# Patient Record
Sex: Female | Born: 1960
Health system: Southern US, Community
[De-identification: ages and names within clinical notes are randomized; demographics above are authoritative.]

## PROBLEM LIST (undated history)

## (undated) DIAGNOSIS — G5602 Carpal tunnel syndrome, left upper limb: Secondary | ICD-10-CM

## (undated) DIAGNOSIS — E669 Obesity, unspecified: Secondary | ICD-10-CM

## (undated) DIAGNOSIS — C221 Intrahepatic bile duct carcinoma: Secondary | ICD-10-CM

## (undated) DIAGNOSIS — I10 Essential (primary) hypertension: Secondary | ICD-10-CM

## (undated) DIAGNOSIS — E119 Type 2 diabetes mellitus without complications: Secondary | ICD-10-CM

## (undated) DIAGNOSIS — M65939 Unspecified synovitis and tenosynovitis, unspecified forearm: Secondary | ICD-10-CM

## (undated) DIAGNOSIS — B353 Tinea pedis: Secondary | ICD-10-CM

## (undated) DIAGNOSIS — L84 Corns and callosities: Secondary | ICD-10-CM

## (undated) DIAGNOSIS — M5412 Radiculopathy, cervical region: Secondary | ICD-10-CM

## (undated) DIAGNOSIS — F32A Depression, unspecified: Secondary | ICD-10-CM

## (undated) DIAGNOSIS — I471 Supraventricular tachycardia, unspecified: Secondary | ICD-10-CM

## (undated) DIAGNOSIS — M109 Gout, unspecified: Secondary | ICD-10-CM

## (undated) DIAGNOSIS — M7542 Impingement syndrome of left shoulder: Secondary | ICD-10-CM

## (undated) DIAGNOSIS — E559 Vitamin D deficiency, unspecified: Secondary | ICD-10-CM

## (undated) DIAGNOSIS — T7840XA Allergy, unspecified, initial encounter: Secondary | ICD-10-CM

## (undated) DIAGNOSIS — R809 Proteinuria, unspecified: Secondary | ICD-10-CM

## (undated) DIAGNOSIS — M1990 Unspecified osteoarthritis, unspecified site: Secondary | ICD-10-CM

## (undated) DIAGNOSIS — E785 Hyperlipidemia, unspecified: Secondary | ICD-10-CM

## (undated) DIAGNOSIS — F329 Major depressive disorder, single episode, unspecified: Secondary | ICD-10-CM

## (undated) DIAGNOSIS — M659 Synovitis and tenosynovitis, unspecified: Secondary | ICD-10-CM

## (undated) DIAGNOSIS — M5417 Radiculopathy, lumbosacral region: Secondary | ICD-10-CM

## (undated) DIAGNOSIS — J45909 Unspecified asthma, uncomplicated: Secondary | ICD-10-CM

## (undated) HISTORY — DX: Supraventricular tachycardia, unspecified: I47.10

## (undated) HISTORY — DX: Unspecified synovitis and tenosynovitis, unspecified forearm: M65.939

## (undated) HISTORY — DX: Carpal tunnel syndrome, left upper limb: G56.02

## (undated) HISTORY — DX: Type 2 diabetes mellitus without complications: E11.9

## (undated) HISTORY — DX: Corns and callosities: L84

## (undated) HISTORY — DX: Synovitis and tenosynovitis, unspecified: M65.9

## (undated) HISTORY — DX: Vitamin D deficiency, unspecified: E55.9

## (undated) HISTORY — DX: Hyperlipidemia, unspecified: E78.5

## (undated) HISTORY — DX: Unspecified osteoarthritis, unspecified site: M19.90

## (undated) HISTORY — DX: Proteinuria, unspecified: R80.9

## (undated) HISTORY — DX: Tinea pedis: B35.3

## (undated) HISTORY — DX: Supraventricular tachycardia: I47.1

## (undated) HISTORY — PX: ABDOMINAL HYSTERECTOMY: SHX81

## (undated) HISTORY — DX: Radiculopathy, cervical region: M54.12

## (undated) HISTORY — DX: Unspecified asthma, uncomplicated: J45.909

## (undated) HISTORY — DX: Gout, unspecified: M10.9

## (undated) HISTORY — DX: Depression, unspecified: F32.A

## (undated) HISTORY — DX: Essential (primary) hypertension: I10

## (undated) HISTORY — DX: Impingement syndrome of left shoulder: M75.42

## (undated) HISTORY — DX: Allergy, unspecified, initial encounter: T78.40XA

## (undated) HISTORY — DX: Intrahepatic bile duct carcinoma: C22.1

## (undated) HISTORY — DX: Obesity, unspecified: E66.9

## (undated) HISTORY — DX: Radiculopathy, lumbosacral region: M54.17

## (undated) MED FILL — Dexamethasone Sodium Phosphate Inj 100 MG/10ML: INTRAMUSCULAR | Qty: 1 | Status: AC

---
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
## 2006-01-15 ENCOUNTER — Observation Stay (HOSPITAL_COMMUNITY): Admission: RE | Admit: 2006-01-15 | Discharge: 2006-01-16 | Payer: Self-pay | Admitting: Gynecology

## 2006-02-16 ENCOUNTER — Ambulatory Visit: Payer: Self-pay

## 2006-03-03 ENCOUNTER — Ambulatory Visit: Payer: Self-pay | Admitting: Family Medicine

## 2006-07-20 ENCOUNTER — Ambulatory Visit: Payer: Self-pay | Admitting: Pain Medicine

## 2006-07-26 ENCOUNTER — Ambulatory Visit: Payer: Self-pay | Admitting: Pain Medicine

## 2006-09-28 ENCOUNTER — Ambulatory Visit: Payer: Self-pay | Admitting: Pain Medicine

## 2006-10-25 ENCOUNTER — Ambulatory Visit: Payer: Self-pay | Admitting: Pain Medicine

## 2006-11-25 ENCOUNTER — Ambulatory Visit: Payer: Self-pay | Admitting: Pain Medicine

## 2007-08-11 DIAGNOSIS — M5417 Radiculopathy, lumbosacral region: Secondary | ICD-10-CM | POA: Insufficient documentation

## 2007-09-07 ENCOUNTER — Ambulatory Visit: Payer: Self-pay | Admitting: Pain Medicine

## 2007-09-15 ENCOUNTER — Ambulatory Visit: Payer: Self-pay | Admitting: Pain Medicine

## 2008-12-28 DIAGNOSIS — J4520 Mild intermittent asthma, uncomplicated: Secondary | ICD-10-CM | POA: Insufficient documentation

## 2008-12-28 DIAGNOSIS — J309 Allergic rhinitis, unspecified: Secondary | ICD-10-CM | POA: Insufficient documentation

## 2009-01-08 DIAGNOSIS — E782 Mixed hyperlipidemia: Secondary | ICD-10-CM | POA: Insufficient documentation

## 2009-01-08 DIAGNOSIS — E559 Vitamin D deficiency, unspecified: Secondary | ICD-10-CM | POA: Insufficient documentation

## 2009-11-05 DIAGNOSIS — IMO0002 Reserved for concepts with insufficient information to code with codable children: Secondary | ICD-10-CM | POA: Insufficient documentation

## 2009-11-05 DIAGNOSIS — I10 Essential (primary) hypertension: Secondary | ICD-10-CM | POA: Insufficient documentation

## 2013-06-02 LAB — LIPID PANEL
Cholesterol: 97 mg/dL (ref 0–200)
HDL: 29 mg/dL — AB (ref 35–70)
LDL Cholesterol: 46 mg/dL
Triglycerides: 112 mg/dL (ref 40–160)

## 2014-04-23 LAB — HM MAMMOGRAPHY

## 2015-03-06 LAB — HEMOGLOBIN A1C: Hgb A1c MFr Bld: 8.3 % — AB (ref 4.0–6.0)

## 2015-04-04 ENCOUNTER — Emergency Department: Admit: 2015-04-04 | Disposition: A | Discharge status: Home / Self Care | Payer: Self-pay | Admitting: Student

## 2015-07-01 ENCOUNTER — Encounter (INDEPENDENT_AMBULATORY_CARE_PROVIDER_SITE_OTHER): Payer: BC Managed Care – PPO | Admitting: Family Medicine

## 2015-07-01 DIAGNOSIS — G5600 Carpal tunnel syndrome, unspecified upper limb: Secondary | ICD-10-CM | POA: Insufficient documentation

## 2015-07-01 DIAGNOSIS — R809 Proteinuria, unspecified: Secondary | ICD-10-CM | POA: Insufficient documentation

## 2015-07-01 DIAGNOSIS — M5412 Radiculopathy, cervical region: Secondary | ICD-10-CM | POA: Insufficient documentation

## 2015-07-01 DIAGNOSIS — I471 Supraventricular tachycardia, unspecified: Secondary | ICD-10-CM

## 2015-07-01 DIAGNOSIS — M7540 Impingement syndrome of unspecified shoulder: Secondary | ICD-10-CM | POA: Insufficient documentation

## 2015-07-01 DIAGNOSIS — L84 Corns and callosities: Secondary | ICD-10-CM | POA: Insufficient documentation

## 2015-07-01 DIAGNOSIS — Z111 Encounter for screening for respiratory tuberculosis: Secondary | ICD-10-CM

## 2015-07-01 DIAGNOSIS — M659 Synovitis and tenosynovitis, unspecified: Secondary | ICD-10-CM | POA: Insufficient documentation

## 2015-07-01 DIAGNOSIS — F17200 Nicotine dependence, unspecified, uncomplicated: Secondary | ICD-10-CM | POA: Insufficient documentation

## 2015-07-01 HISTORY — DX: Supraventricular tachycardia, unspecified: I47.10

## 2015-07-01 HISTORY — DX: Supraventricular tachycardia: I47.1

## 2015-07-01 NOTE — Progress Notes (Signed)
Opened in error. Office visit changed to nursing visit.

## 2015-07-03 ENCOUNTER — Other Ambulatory Visit: Payer: Self-pay

## 2015-07-03 LAB — TB SKIN TEST
Induration: 0 mm
TB Skin Test: NEGATIVE

## 2015-07-10 ENCOUNTER — Telehealth: Payer: Self-pay | Admitting: Family Medicine

## 2015-07-10 NOTE — Telephone Encounter (Signed)
ERRENOUS °

## 2015-07-26 ENCOUNTER — Telehealth: Payer: Self-pay | Admitting: Family Medicine

## 2015-07-26 NOTE — Telephone Encounter (Signed)
Due to patient and child having upper respiratory issues (asthma), patient is requesting a letter for social services to help her get a air conditioner. States that she lives in a trailer and hers is broken and social services are willing to help her. Requesting to pick this up this afternoon.

## 2015-07-29 NOTE — Telephone Encounter (Signed)
She will need to be seen

## 2015-07-30 NOTE — Telephone Encounter (Signed)
Dr.sowles states she will need to be seen in 1st available slot

## 2015-07-30 NOTE — Telephone Encounter (Signed)
Patient informed. stated that she did not have the copay for a piece of paper and hung up.

## 2015-09-16 ENCOUNTER — Encounter: Payer: Self-pay | Admitting: Emergency Medicine

## 2015-09-16 ENCOUNTER — Emergency Department
Admission: EM | Admit: 2015-09-16 | Discharge: 2015-09-16 | Disposition: A | Discharge status: Home / Self Care | Payer: Self-pay | Attending: Emergency Medicine | Admitting: Emergency Medicine

## 2015-09-16 DIAGNOSIS — G8929 Other chronic pain: Secondary | ICD-10-CM | POA: Insufficient documentation

## 2015-09-16 DIAGNOSIS — E119 Type 2 diabetes mellitus without complications: Secondary | ICD-10-CM | POA: Insufficient documentation

## 2015-09-16 DIAGNOSIS — Z79899 Other long term (current) drug therapy: Secondary | ICD-10-CM | POA: Insufficient documentation

## 2015-09-16 DIAGNOSIS — M545 Low back pain: Secondary | ICD-10-CM | POA: Insufficient documentation

## 2015-09-16 DIAGNOSIS — M549 Dorsalgia, unspecified: Secondary | ICD-10-CM

## 2015-09-16 DIAGNOSIS — I10 Essential (primary) hypertension: Secondary | ICD-10-CM | POA: Insufficient documentation

## 2015-09-16 DIAGNOSIS — Z7951 Long term (current) use of inhaled steroids: Secondary | ICD-10-CM | POA: Insufficient documentation

## 2015-09-16 DIAGNOSIS — E669 Obesity, unspecified: Secondary | ICD-10-CM | POA: Insufficient documentation

## 2015-09-16 DIAGNOSIS — Z7982 Long term (current) use of aspirin: Secondary | ICD-10-CM | POA: Insufficient documentation

## 2015-09-16 DIAGNOSIS — E785 Hyperlipidemia, unspecified: Secondary | ICD-10-CM | POA: Insufficient documentation

## 2015-09-16 MED ORDER — TRAMADOL HCL 50 MG PO TABS: 50.0000 mg | ORAL_TABLET | Freq: Four times a day (QID) | ORAL | Status: DC | PRN

## 2015-09-16 MED ORDER — ORPHENADRINE CITRATE 30 MG/ML IJ SOLN
60.0000 mg | Freq: Two times a day (BID) | INTRAMUSCULAR | Status: DC
  Administered 2015-09-16: 60 mg via INTRAMUSCULAR
  Filled 2015-09-16: qty 2

## 2015-09-16 MED ORDER — HYDROMORPHONE HCL 1 MG/ML IJ SOLN
1.0000 mg | Freq: Once | INTRAMUSCULAR | Status: AC
  Administered 2015-09-16: 1 mg via INTRAMUSCULAR
  Filled 2015-09-16: qty 1

## 2015-09-16 MED ORDER — MELOXICAM 15 MG PO TABS: 15.0000 mg | ORAL_TABLET | Freq: Every day | ORAL | Status: DC

## 2015-09-16 MED ORDER — KETOROLAC TROMETHAMINE 60 MG/2ML IM SOLN
60.0000 mg | Freq: Once | INTRAMUSCULAR | Status: AC
  Administered 2015-09-16: 60 mg via INTRAMUSCULAR
  Filled 2015-09-16: qty 2

## 2015-09-16 NOTE — ED Notes (Signed)
Patient presents to the ED with severe back pain x 1.5 weeks with a history of chronic back pain.  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

## 2015-09-16 NOTE — ED Provider Notes (Signed)
[facility] Emergency Department Provider Note  ____________________________________________  Time seen: Approximately 10:52 AM  I have reviewed the triage vital signs and the nursing notes.   HISTORY  Chief Complaint Back Pain    HPI Janice Short is a 54 y.o. female the patient complaining of week and a half of severe low back pain. Patient has history of chronic low back pain. Patient has a remarkable family history ARTHRITIS. Patient denies any radicular component to this pain. She denies any bladder or bowel dysfunction. Patient rates her pain as a 10 over 10. No palliative measures taken for this complaint.  Past Medical History  Diagnosis Date  . Tenosynovitis of wrist   . Corns and callosity   . SVT (supraventricular tachycardia) ([place])   . Obesity   . Chronic osteoarthritis   . Cervical radiculitis   . Microalbuminuria   . Impingement syndrome of left shoulder   . Carpal tunnel syndrome on left   . Lumbosacral neuritis   . Asthma   . Allergy   . Gout   . Hyperlipidemia   . Vitamin D deficiency   . Dermatophytosis of foot   . Hypertension   . Diabetes mellitus without complication [facility])     Patient Active Problem List   Diagnosis Date Noted  . Carpal tunnel syndrome 07/01/2015  . Cervical radiculitis 07/01/2015  . Arthritis sicca 07/01/2015  . Corn or callus 07/01/2015  . Impingement syndrome of shoulder 07/01/2015  . Microalbuminuria 07/01/2015  . Adiposity 07/01/2015  . Supraventricular tachycardia ([place]) 07/01/2015  . Compulsive tobacco user syndrome 07/01/2015  . Tenosynovitis of wrist 07/01/2015  . Benign hypertension 11/05/2009  . Diabetes mellitus type 2, uncontrolled ([place]) 11/05/2009  . Athlete's foot 08/05/2009  . Combined fat and carbohydrate induced hyperlipemia 01/08/2009  . Avitaminosis D 01/08/2009  . Asthma, mild intermittent 12/28/2008  . Allergic rhinitis 12/28/2008  . Lumbosacral neuritis 08/11/2007    Past  Surgical History  Procedure Laterality Date  . Abdominal hysterectomy      Current Outpatient Rx  Name  Route  Sig  Dispense  Refill  . acetaminophen (TYLENOL) 500 MG tablet   Oral   Take 1 tablet by mouth at bedtime as needed.         Marland Kitchen aspirin 81 MG tablet   Oral   Take 1 tablet by mouth daily.         Marland Kitchen atenolol (TENORMIN) 25 MG tablet   Oral   Take 1 tablet by mouth every evening.         Marland Kitchen atorvastatin (LIPITOR) 40 MG tablet   Oral   Take 1 tablet by mouth at bedtime.         . beclomethasone (QVAR) 40 MCG/ACT inhaler   Inhalation   Inhale 2 puffs into the lungs 2 (two) times daily.         . diclofenac sodium (VOLTAREN) 1 % GEL   Transdermal   Place 4 g onto the skin every 6 (six) hours as needed.         . fluticasone (FLONASE) 50 MCG/ACT nasal spray   Nasal   Place 2 sprays into the nose at bedtime.         . gabapentin (NEURONTIN) 300 MG capsule   Oral   Take 1 capsule by mouth every evening.         Marland Kitchen losartan-hydrochlorothiazide (HYZAAR) 50-12.5 MG per tablet   Oral   Take 1 tablet by mouth daily.         Marland Kitchen  meloxicam (MOBIC) 15 MG tablet   Oral   Take 1 tablet (15 mg total) by mouth daily.   30 tablet   2   . naproxen (NAPROSYN) 500 MG tablet   Oral   Take 1 tablet by mouth 2 (two) times daily as needed.         . Saxagliptin-Metformin (KOMBIGLYZE XR) 2.04-999 MG TB24   Oral   Take 2 tablets by mouth daily.         . traMADol (ULTRAM) 50 MG tablet   Oral   Take 1 tablet (50 mg total) by mouth every 6 (six) hours as needed for moderate pain.   12 tablet   0     Allergies Review of patient's allergies indicates no known allergies.  Family History  Problem Relation Age of Onset  . Heart attack Mother   . CAD Mother   . Kidney disease Father     Social History Social History  Substance Use Topics  . Smoking status: None  . Smokeless tobacco: None  . Alcohol Use: None    Review of Systems Constitutional:  No fever/chills Eyes: No visual changes. ENT: No sore throat. Cardiovascular: Denies chest pain. Respiratory: Denies shortness of breath. Gastrointestinal: No abdominal pain.  No nausea, no vomiting.  No diarrhea.  No constipation. Genitourinary: Negative for dysuria. Musculoskeletal: Positive for back pain. Skin: Negative for rash. Neurological: Negative for headaches, focal weakness or numbness. Endocrine:Obesity, hyperlipidemia, hypertension, diabetes. 10-point ROS otherwise negative.  ____________________________________________   PHYSICAL EXAM:  VITAL SIGNS: ED Triage Vitals  Enc Vitals Group     BP --      Pulse --      Resp --      Temp --      Temp src --      SpO2 --      Weight --      Height --      Head Cir --      Peak Flow --      Pain Score --      Pain Loc --      Pain Edu? --      Excl. in [place]? --     Constitutional: Alert and oriented. Moderate distress  Eyes: Conjunctivae are normal. PERRL. EOMI. Head: Atraumatic. Nose: No congestion/rhinnorhea. Mouth/Throat: Mucous membranes are moist.  Oropharynx non-erythematous. Neck: No stridor.  No cervical spine tenderness to palpation. Hematological/Lymphatic/Immunilogical: No cervical lymphadenopathy. Cardiovascular: Normal rate, regular rhythm. Grossly normal heart sounds.  Good peripheral circulation. Respiratory: Normal respiratory effort.  No retractions. Lungs CTAB. Gastrointestinal: Soft and nontender. No distention. No abdominal bruits. No CVA tenderness. Musculoskeletal: No spinal deformity. Patient stands in a flexed position. Patient moderate guarding at L3-S1. Patient has negative straight leg test. No diminished strength is 5 over 5. Neurologic:  Normal speech and language. No gross focal neurologic deficits are appreciated. No gait instability. Skin:  Skin is warm, dry and intact. No rash noted. Psychiatric: Mood and affect are normal. Speech and behavior are  normal.  ____________________________________________   LABS (all labs ordered are listed, but only abnormal results are displayed)  Labs Reviewed - No data to display ____________________________________________  EKG   ____________________________________________  RADIOLOGY   ____________________________________________   PROCEDURES  Procedure(s) performed: None  Critical Care performed: No  ____________________________________________   INITIAL IMPRESSION / ASSESSMENT AND PLAN / ED COURSE  Pertinent labs & imaging results that were available during my care of the patient were reviewed by me  and considered in my medical decision making (see chart for details).  Acute low back pain. Patient given a prescription for Mobic and tramadol. Patient advised follow-up with her primary care doctor for continued care.   FINAL CLINICAL IMPRESSION(S) / ED DIAGNOSES  Final diagnoses:  Chronic back pain greater than 3 months duration      Sable Feil, PA-C 09/16/15 1125  Orbie Pyo, MD 09/16/15 [phone]

## 2015-11-19 ENCOUNTER — Encounter: Payer: Self-pay | Admitting: Family Medicine

## 2015-11-19 ENCOUNTER — Ambulatory Visit (INDEPENDENT_AMBULATORY_CARE_PROVIDER_SITE_OTHER): Payer: BC Managed Care – PPO | Admitting: Family Medicine

## 2015-11-19 VITALS — BP 142/86 | HR 121 | Temp 97.7°F | Resp 14 | Ht 66.0 in | Wt 214.5 lb

## 2015-11-19 DIAGNOSIS — J4520 Mild intermittent asthma, uncomplicated: Secondary | ICD-10-CM | POA: Diagnosis not present

## 2015-11-19 DIAGNOSIS — Z8261 Family history of arthritis: Secondary | ICD-10-CM

## 2015-11-19 DIAGNOSIS — E1142 Type 2 diabetes mellitus with diabetic polyneuropathy: Secondary | ICD-10-CM | POA: Diagnosis not present

## 2015-11-19 DIAGNOSIS — E1121 Type 2 diabetes mellitus with diabetic nephropathy: Secondary | ICD-10-CM

## 2015-11-19 DIAGNOSIS — I10 Essential (primary) hypertension: Secondary | ICD-10-CM

## 2015-11-19 DIAGNOSIS — E785 Hyperlipidemia, unspecified: Secondary | ICD-10-CM

## 2015-11-19 DIAGNOSIS — E559 Vitamin D deficiency, unspecified: Secondary | ICD-10-CM | POA: Diagnosis not present

## 2015-11-19 DIAGNOSIS — Z13220 Encounter for screening for lipoid disorders: Secondary | ICD-10-CM

## 2015-11-19 DIAGNOSIS — Z23 Encounter for immunization: Secondary | ICD-10-CM | POA: Diagnosis not present

## 2015-11-19 DIAGNOSIS — M20002 Unspecified deformity of left finger(s): Secondary | ICD-10-CM

## 2015-11-19 DIAGNOSIS — M2550 Pain in unspecified joint: Secondary | ICD-10-CM | POA: Diagnosis not present

## 2015-11-19 LAB — POCT GLYCOSYLATED HEMOGLOBIN (HGB A1C): Hemoglobin A1C: 8.3

## 2015-11-19 LAB — POCT UA - MICROALBUMIN: Microalbumin Ur, POC: 100 mg/L

## 2015-11-19 MED ORDER — GABAPENTIN 300 MG PO CAPS: 300.0000 mg | ORAL_CAPSULE | Freq: Every evening | ORAL | Status: DC

## 2015-11-19 MED ORDER — ATENOLOL 25 MG PO TABS: 25.0000 mg | ORAL_TABLET | Freq: Every evening | ORAL | Status: DC

## 2015-11-19 MED ORDER — TRAMADOL HCL 50 MG PO TABS: 50.0000 mg | ORAL_TABLET | Freq: Two times a day (BID) | ORAL | Status: DC | PRN

## 2015-11-19 MED ORDER — ATORVASTATIN CALCIUM 40 MG PO TABS: 40.0000 mg | ORAL_TABLET | Freq: Every day | ORAL | Status: DC

## 2015-11-19 MED ORDER — LOSARTAN POTASSIUM-HCTZ 50-12.5 MG PO TABS: 1.0000 | ORAL_TABLET | Freq: Every day | ORAL | Status: DC

## 2015-11-19 MED ORDER — DICLOFENAC SODIUM 1 % TD GEL: 4.0000 g | Freq: Four times a day (QID) | TRANSDERMAL | Status: DC

## 2015-11-19 MED ORDER — SAXAGLIPTIN-METFORMIN ER 2.5-1000 MG PO TB24: 2.0000 | ORAL_TABLET | Freq: Every day | ORAL | Status: DC

## 2015-11-19 NOTE — Progress Notes (Signed)
Name: Janice Short   MRN: [id number]    DOB: 05/18/1961   Date:11/19/2015       Progress Note  Subjective  Chief Complaint  Chief Complaint  Patient presents with  . Medication Refill    currently not taking any medications  . Diabetes  . Hypertension  . Hyperlipidemia  . Asthma  . Knee Pain    left onset several months worsening.  patient states having tingling and numbness.     HPI  DMII with nephropathy and also neuropathy: she is currently out of all her medications - she states she had to move and could not afford to pay for medication since Summer. She is now working two jobs and is going to resume her medications.  HgbA1C is still at 8.3 %. She denies polyphagia, but has polydipsia no polyuria. No blurred vision. She is not checking her fsbs at home because she does not have a glucometer. She used to take Gabapentin for numbness on leg and ARB for proteinuria but also out of medication   HTN: out of bp medication also BP is a little elevated, denies chest pain or palpitation  Asthma Mild Intermittent: usually pregnant in the Spring and when very hot. Currently no wheezing, no cough, no SOB  Hyperlipidemia: out of Lipitor , needs refills of medication. Last lipid was done over 2 years ago  Joint deformity and joint pain: her sister has RA, she has hand deformities and daily pain on her joints, worse on hands and knees. Pain is described as aching.  Joints are stiff when she gets up, but usually better within 30 minutes. She states Meloxicam helps with her symptoms.    Patient Active Problem List   Diagnosis Date Noted  . Carpal tunnel syndrome 07/01/2015  . Cervical radiculitis 07/01/2015  . Arthritis sicca 07/01/2015  . Corn or callus 07/01/2015  . Impingement syndrome of shoulder 07/01/2015  . Microalbuminuria 07/01/2015  . Morbid obesity ([place]) 07/01/2015  . Supraventricular tachycardia ([place]) 07/01/2015  . Compulsive tobacco user syndrome 07/01/2015  . Tenosynovitis  of wrist 07/01/2015  . Benign hypertension 11/05/2009  . Diabetes mellitus type 2, uncontrolled ([place]) 11/05/2009  . Athlete's foot 08/05/2009  . Combined fat and carbohydrate induced hyperlipemia 01/08/2009  . Vitamin D deficiency 01/08/2009  . Asthma, mild intermittent 12/28/2008  . Allergic rhinitis 12/28/2008  . Lumbosacral neuritis 08/11/2007    Past Surgical History  Procedure Laterality Date  . Abdominal hysterectomy      Family History  Problem Relation Age of Onset  . Heart attack Mother   . CAD Mother   . Kidney disease Father     Social History   Social History  . Marital Status: Single    Spouse Name: N/A  . Number of Children: N/A  . Years of Education: N/A   Occupational History  . Not on file.   Social History Main Topics  . Smoking status: Current Some Day Smoker  . Smokeless tobacco: Never Used  . Alcohol Use: No  . Drug Use: No  . Sexual Activity: Yes   Other Topics Concern  . Not on file   Social History Narrative     Current outpatient prescriptions:  .  acetaminophen (TYLENOL) 500 MG tablet, Take 1 tablet by mouth at bedtime as needed., Disp: , Rfl:  .  aspirin 81 MG tablet, Take 1 tablet by mouth daily., Disp: , Rfl:  .  atenolol (TENORMIN) 25 MG tablet, Take 1 tablet (25 mg total)  by mouth every evening., Disp: 30 tablet, Rfl: 5 .  atorvastatin (LIPITOR) 40 MG tablet, Take 1 tablet (40 mg total) by mouth at bedtime., Disp: 30 tablet, Rfl: 5 .  diclofenac sodium (VOLTAREN) 1 % GEL, Apply 4 g topically 4 (four) times daily., Disp: 100 g, Rfl: 5 .  fluticasone (FLONASE) 50 MCG/ACT nasal spray, Place 2 sprays into the nose at bedtime., Disp: , Rfl:  .  gabapentin (NEURONTIN) 300 MG capsule, Take 1 capsule (300 mg total) by mouth every evening., Disp: 30 capsule, Rfl: 5 .  losartan-hydrochlorothiazide (HYZAAR) 50-12.5 MG tablet, Take 1 tablet by mouth daily., Disp: 30 tablet, Rfl: 5 .  meloxicam (MOBIC) 15 MG tablet, Take 1 tablet (15 mg  total) by mouth daily., Disp: 30 tablet, Rfl: 2 .  Saxagliptin-Metformin (KOMBIGLYZE XR) 2.04-999 MG TB24, Take 2 tablets by mouth daily., Disp: 60 tablet, Rfl: 5 .  traMADol (ULTRAM) 50 MG tablet, Take 1 tablet (50 mg total) by mouth every 6 (six) hours as needed for moderate pain., Disp: 12 tablet, Rfl: 0  No Known Allergies   ROS  Constitutional: Negative for fever or significant weight change.  Respiratory: Negative for cough and shortness of breath.   Cardiovascular: Negative for chest pain or palpitations.  Gastrointestinal: Negative for abdominal pain, no bowel changes.  Musculoskeletal: Positive  for gait problem or joint swelling.  Skin: Negative for rash.  Neurological: Negative for dizziness or headache.  No other specific complaints in a complete review of systems (except as listed in HPI above).   Objective  Filed Vitals:   11/19/15 1634  BP: 142/86  Pulse: 121  Temp: 97.7 F (36.5 C)  TempSrc: Oral  Resp: 14  Height: '5\' 6"'$  (1.676 m)  Weight: 214 lb 8 oz (97.297 kg)  SpO2: 98%    Body mass index is 34.64 kg/(m^2).  Physical Exam  Constitutional: Patient appears well-developed and well-nourished. Obese No distress.  HEENT: head atraumatic, normocephalic, pupils equal and reactive to light,, neck supple, throat within normal limits Cardiovascular: Normal rate, regular rhythm and normal heart sounds.  No murmur heard. No BLE edema. Pulmonary/Chest: Effort normal and breath sounds normal. No respiratory distress. Abdominal: Soft.  There is no tenderness. Psychiatric: Patient has a normal mood and affect. behavior is normal. Judgment and thought content normal. Muscular skeletal: crepitus with extension of left knee, hypertrophy of DIP joints both hands, bunion formation, both feet has a lot of corn formation   Recent Results (from the past 2160 hour(s))  POCT HgB A1C     Status: Abnormal   Collection Time: 11/19/15  4:44 PM  Result Value Ref Range    Hemoglobin A1C 8.3   POCT UA - Microalbumin     Status: Abnormal   Collection Time: 11/19/15  4:44 PM  Result Value Ref Range   Microalbumin Ur, POC 100 mg/L   Creatinine, POC  mg/dL   Albumin/Creatinine Ratio, Urine, POC     Diabetic Foot Exam - Simple   Simple Foot Form  Diabetic Foot exam was performed with the following findings:  Yes 11/19/2015  5:19 PM  Visual Inspection  See comments:  Yes  Sensation Testing  Intact to touch and monofilament testing bilaterally:  Yes  Pulse Check  Posterior Tibialis and Dorsalis pulse intact bilaterally:  Yes  Comments      PHQ2/9: Depression screen PHQ 2/9 11/19/2015  Decreased Interest 0  Down, Depressed, Hopeless 0  PHQ - 2 Score 0     Fall  Risk: Fall Risk  11/19/2015  Falls in the past year? Yes  Number falls in past yr: 1  Injury with Fall? Yes     Functional Status Survey: Is the patient deaf or have difficulty hearing?: No Does the patient have difficulty seeing, even when wearing glasses/contacts?: No Does the patient have difficulty concentrating, remembering, or making decisions?: No Does the patient have difficulty walking or climbing stairs?: No Does the patient have difficulty dressing or bathing?: No Does the patient have difficulty doing errands alone such as visiting a doctor's office or shopping?: No   Assessment & Plan  1. Type 2 diabetes mellitus with diabetic nephropathy, without long-term current use of insulin (HCC)  Resume medication  - POCT HgB A1C - POCT UA - Microalbumin - losartan-hydrochlorothiazide (HYZAAR) 50-12.5 MG tablet; Take 1 tablet by mouth daily.  Dispense: 30 tablet; Refill: 5 - Saxagliptin-Metformin (KOMBIGLYZE XR) 2.04-999 MG TB24; Take 2 tablets by mouth daily.  Dispense: 60 tablet; Refill: 5  2. Needs flu shot  - Flu Vaccine QUAD 36+ mos PF IM (Fluarix & Fluzone Quad PF)  3. Asthma, mild intermittent, uncomplicated  Doing well on prn medication   4. Benign  hypertension  Resume medication  - Comprehensive metabolic panel - CBC with Differential/Platelet - atenolol (TENORMIN) 25 MG tablet; Take 1 tablet (25 mg total) by mouth every evening.  Dispense: 30 tablet; Refill: 5 - losartan-hydrochlorothiazide (HYZAAR) 50-12.5 MG tablet; Take 1 tablet by mouth daily.  Dispense: 30 tablet; Refill: 5  5. Morbid obesity, unspecified obesity type [facility])  Discussed with the patient the risk posed by an increased BMI. Discussed importance of portion control, calorie counting and at least 150 minutes of physical activity weekly. Avoid sweet beverages and drink more water. Eat at least 6 servings of fruit and vegetables daily   6. Vitamin D deficiency  She states she cannot afford having repeat vitamin D level   7. Arthralgia  And family history of RA and with deformity , we will check labs, taking Meloxicam for OA - C-reactive protein - Sedimentation rate - Rheumatoid Arthritis Profile - diclofenac sodium (VOLTAREN) 1 % GEL; Apply 4 g topically 4 (four) times daily.  Dispense: 100 g; Refill: 5  8. 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 Family history of rheumatoid arthritis   10. Dyslipidemia  - Lipid panel - atorvastatin (LIPITOR) 40 MG tablet; Take 1 tablet (40 mg total) by mouth at bedtime.  Dispense: 30 tablet; Refill: 5  11. Diabetic polyneuropathy associated with type 2 diabetes mellitus (HCC)  - gabapentin (NEURONTIN) 300 MG capsule; Take 1 capsule (300 mg total) by mouth every evening.  Dispense: 30 capsule; Refill: 5

## 2015-11-19 NOTE — Addendum Note (Signed)
Addended by: Steele Sizer F on: 11/19/2015 05:24 PM   Modules accepted: Orders

## 2015-12-07 LAB — CBC WITH DIFFERENTIAL/PLATELET
BASOS: 0 %
Basophils Absolute: 0 10*3/uL (ref 0.0–0.2)
EOS (ABSOLUTE): 0.3 10*3/uL (ref 0.0–0.4)
EOS: 4 %
HEMATOCRIT: 45.1 % (ref 34.0–46.6)
HEMOGLOBIN: 15.3 g/dL (ref 11.1–15.9)
IMMATURE GRANS (ABS): 0 10*3/uL (ref 0.0–0.1)
IMMATURE GRANULOCYTES: 0 %
LYMPHS: 38 %
Lymphocytes Absolute: 2.7 10*3/uL (ref 0.7–3.1)
MCH: 31.9 pg (ref 26.6–33.0)
MCHC: 33.9 g/dL (ref 31.5–35.7)
MCV: 94 fL (ref 79–97)
MONOCYTES: 9 %
Monocytes Absolute: 0.7 10*3/uL (ref 0.1–0.9)
NEUTROS PCT: 49 %
Neutrophils Absolute: 3.5 10*3/uL (ref 1.4–7.0)
Platelets: 236 10*3/uL (ref 150–379)
RBC: 4.79 x10E6/uL (ref 3.77–5.28)
RDW: 13.5 % (ref 12.3–15.4)
WBC: 7.1 10*3/uL (ref 3.4–10.8)

## 2015-12-07 LAB — COMPREHENSIVE METABOLIC PANEL
ALBUMIN: 4.1 g/dL (ref 3.5–5.5)
ALK PHOS: 128 IU/L — AB (ref 39–117)
ALT: 22 IU/L (ref 0–32)
AST: 16 IU/L (ref 0–40)
Albumin/Globulin Ratio: 1.4 (ref 1.1–2.5)
BUN / CREAT RATIO: 12 (ref 9–23)
BUN: 10 mg/dL (ref 6–24)
Bilirubin Total: 0.3 mg/dL (ref 0.0–1.2)
CO2: 24 mmol/L (ref 18–29)
CREATININE: 0.86 mg/dL (ref 0.57–1.00)
Calcium: 9.5 mg/dL (ref 8.7–10.2)
Chloride: 100 mmol/L (ref 96–106)
GFR calc non Af Amer: 76 mL/min/{1.73_m2} (ref >59)
GFR, EST AFRICAN AMERICAN: 87 mL/min/{1.73_m2} (ref >59)
GLOBULIN, TOTAL: 2.9 g/dL (ref 1.5–4.5)
GLUCOSE: 250 mg/dL — AB (ref 65–99)
Potassium: 4.6 mmol/L (ref 3.5–5.2)
SODIUM: 140 mmol/L (ref 134–144)
TOTAL PROTEIN: 7 g/dL (ref 6.0–8.5)

## 2015-12-07 LAB — C-REACTIVE PROTEIN: CRP: 30.8 mg/L — ABNORMAL HIGH (ref 0.0–4.9)

## 2015-12-07 LAB — SEDIMENTATION RATE: Sed Rate: 31 mm/hr (ref 0–40)

## 2015-12-07 LAB — LIPID PANEL
Chol/HDL Ratio: 6.6 ratio units — ABNORMAL HIGH (ref 0.0–4.4)
Cholesterol, Total: 184 mg/dL (ref 100–199)
HDL: 28 mg/dL — ABNORMAL LOW (ref >39)
LDL CALC: 122 mg/dL — AB (ref 0–99)
TRIGLYCERIDES: 172 mg/dL — AB (ref 0–149)
VLDL Cholesterol Cal: 34 mg/dL (ref 5–40)

## 2015-12-08 LAB — RHEUMATOID ARTHRITIS PROFILE: Cyclic Citrullin Peptide Ab: 12 units (ref 0–19)

## 2015-12-11 ENCOUNTER — Telehealth: Payer: Self-pay

## 2015-12-11 NOTE — Telephone Encounter (Signed)
-----   Message from Steele Sizer, MD sent at 12/10/2015  9:50 PM EST ----- RA test negative , she does not have RA Normal CBC She needs to take Lipitor daily because LDL is high and she also has  low HDL : to improve HDL she   needs to eat tree nuts ( pecans/pistachios/almonds ) four times weekly, eat fish two times weekly  and exercise  at least 150 minutes per week Fasting sugar was very high , normal kidney and liver function test Sed rate is normal, but C-reactive protein is very high and it can be secondary to the inflammation caused by DM. It can also be a risk factor for heart attacks and strokes. We will need to recheck on her next visit

## 2015-12-11 NOTE — Telephone Encounter (Signed)
Left a vm since patient was unavailable.

## 2015-12-17 ENCOUNTER — Telehealth: Payer: Self-pay

## 2015-12-17 DIAGNOSIS — E785 Hyperlipidemia, unspecified: Secondary | ICD-10-CM

## 2015-12-17 DIAGNOSIS — E1121 Type 2 diabetes mellitus with diabetic nephropathy: Secondary | ICD-10-CM

## 2015-12-17 DIAGNOSIS — I10 Essential (primary) hypertension: Secondary | ICD-10-CM

## 2015-12-17 DIAGNOSIS — M2550 Pain in unspecified joint: Secondary | ICD-10-CM

## 2015-12-17 DIAGNOSIS — E1142 Type 2 diabetes mellitus with diabetic polyneuropathy: Secondary | ICD-10-CM

## 2015-12-17 DIAGNOSIS — M21949 Unspecified acquired deformity of hand, unspecified hand: Secondary | ICD-10-CM

## 2015-12-17 MED ORDER — ATORVASTATIN CALCIUM 40 MG PO TABS: 40.0000 mg | ORAL_TABLET | Freq: Every day | ORAL | Status: DC

## 2015-12-17 MED ORDER — SAXAGLIPTIN-METFORMIN ER 2.5-1000 MG PO TB24: 2.0000 | ORAL_TABLET | Freq: Every day | ORAL | Status: DC

## 2015-12-17 MED ORDER — GABAPENTIN 300 MG PO CAPS: 300.0000 mg | ORAL_CAPSULE | Freq: Every evening | ORAL | Status: DC

## 2015-12-17 MED ORDER — LOSARTAN POTASSIUM-HCTZ 50-12.5 MG PO TABS: 1.0000 | ORAL_TABLET | Freq: Every day | ORAL | Status: DC

## 2015-12-17 MED ORDER — DICLOFENAC SODIUM 1 % TD GEL: 4.0000 g | Freq: Four times a day (QID) | TRANSDERMAL | Status: DC

## 2015-12-17 MED ORDER — ATENOLOL 25 MG PO TABS: 25.0000 mg | ORAL_TABLET | Freq: Every evening | ORAL | Status: DC

## 2015-12-17 NOTE — Telephone Encounter (Signed)
I will refer her to Rheumatologist, may have inflammatory arthritis, even though negative for RA

## 2015-12-17 NOTE — Telephone Encounter (Signed)
Patient called and stated the Tramadol that she was prescribed is not controlling her pain, she is still waking up with sharp pains. Is there anything you can prescribed to help her ease the pain? Thanks. Patient also called and stated she no longer uses [facility] but uses [facility] [address] ask that we please switch her pharmacies.

## 2015-12-18 ENCOUNTER — Telehealth: Payer: Self-pay

## 2015-12-18 MED ORDER — GABAPENTIN 100 MG PO CAPS: 100.0000 mg | ORAL_CAPSULE | Freq: Every day | ORAL | Status: DC

## 2015-12-18 MED ORDER — LINAGLIPTIN-METFORMIN HCL ER 2.5-1000 MG PO TB24: 2.0000 | ORAL_TABLET | Freq: Every day | ORAL | Status: DC

## 2015-12-18 NOTE — Telephone Encounter (Signed)
Changed to [facility]

## 2015-12-18 NOTE — Telephone Encounter (Signed)
Please switch medication due to non-coverage of Kombiglyze to either Janumet, Janumet XR, or Jentadueto. Thanks

## 2015-12-18 NOTE — Telephone Encounter (Signed)
Patient notified but asked that Tramadol is not helping could you please send in Gabapentin for relief until she goes to RA.

## 2015-12-18 NOTE — Addendum Note (Signed)
Addended by: Steele Sizer F on: 12/18/2015 01:06 PM   Modules accepted: Orders

## 2015-12-18 NOTE — Telephone Encounter (Signed)
Done. Titrate up slowly and titrate down slowly

## 2016-01-09 ENCOUNTER — Emergency Department
Admission: EM | Admit: 2016-01-09 | Discharge: 2016-01-09 | Disposition: A | Discharge status: Home / Self Care | Payer: BC Managed Care – PPO | Attending: Emergency Medicine | Admitting: Emergency Medicine

## 2016-01-09 ENCOUNTER — Encounter: Payer: Self-pay | Admitting: Emergency Medicine

## 2016-01-09 DIAGNOSIS — I10 Essential (primary) hypertension: Secondary | ICD-10-CM | POA: Diagnosis not present

## 2016-01-09 DIAGNOSIS — Z79899 Other long term (current) drug therapy: Secondary | ICD-10-CM | POA: Insufficient documentation

## 2016-01-09 DIAGNOSIS — F17200 Nicotine dependence, unspecified, uncomplicated: Secondary | ICD-10-CM | POA: Diagnosis not present

## 2016-01-09 DIAGNOSIS — Z791 Long term (current) use of non-steroidal anti-inflammatories (NSAID): Secondary | ICD-10-CM | POA: Diagnosis not present

## 2016-01-09 DIAGNOSIS — Z7982 Long term (current) use of aspirin: Secondary | ICD-10-CM | POA: Insufficient documentation

## 2016-01-09 DIAGNOSIS — M19041 Primary osteoarthritis, right hand: Secondary | ICD-10-CM | POA: Diagnosis not present

## 2016-01-09 DIAGNOSIS — M19042 Primary osteoarthritis, left hand: Secondary | ICD-10-CM | POA: Diagnosis not present

## 2016-01-09 DIAGNOSIS — R2233 Localized swelling, mass and lump, upper limb, bilateral: Secondary | ICD-10-CM | POA: Diagnosis present

## 2016-01-09 DIAGNOSIS — E119 Type 2 diabetes mellitus without complications: Secondary | ICD-10-CM | POA: Insufficient documentation

## 2016-01-09 MED ORDER — ETODOLAC 400 MG PO TABS: 400.0000 mg | ORAL_TABLET | Freq: Two times a day (BID) | ORAL | Status: DC

## 2016-01-09 NOTE — Discharge Instructions (Signed)
Arthritis Arthritis means joint pain. It can also mean joint disease. A joint is a place where bones come together. People who have arthritis may have:  Red joints.  Swollen joints.  Stiff joints.  Warm joints.  A fever.  A feeling of being sick. HOME CARE Pay attention to any changes in your symptoms. Take these actions to help with your pain and swelling. Medicines  Take over-the-counter and prescription medicines only as told by your doctor.  Do not take aspirin for pain if your doctor says that you may have gout. Activities  Rest your joint if your doctor tells you to.  Avoid activities that make the pain worse.  Exercise your joint regularly as told by your doctor. Try doing exercises like:  Swimming.  Water aerobics.  Biking.  Walking. Joint Care  If your joint is swollen, keep it raised (elevated) if told by your doctor.  If your joint feels stiff in the morning, try taking a warm shower.  If you have diabetes, do not apply heat without asking your doctor.  If told, apply heat to the joint:  Put a towel between the joint and the hot pack or heating pad.  Leave the heat on the area for 20-30 minutes.  If told, apply ice to the joint:  Put ice in a plastic bag.  Place a towel between your skin and the bag.  Leave the ice on for 20 minutes, 2-3 times per day.  Keep all follow-up visits as told by your doctor. GET HELP IF:  The pain gets worse.  You have a fever. GET HELP RIGHT AWAY IF:  You have very bad pain in your joint.  You have swelling in your joint.  Your joint is red.  Many joints become painful and swollen.  You have very bad back pain.  Your leg is very weak.  You cannot control your pee (urine) or poop (stool).   This information is not intended to replace advice given to you by your health care provider. Make sure you discuss any questions you have with your health care provider.   Document Released: 02/17/2010  Document Revised: 08/14/2015 Document Reviewed: 02/18/2015 Elsevier Interactive Patient Education 2016 [place] with Dr. Ancil Boozer for your arthritis pain. Begin taking etodolac with food twice a day. He may also take Tylenol in between if any continued pain until you can see Dr. Ancil Boozer

## 2016-01-09 NOTE — ED Notes (Signed)
Pt states she is having swelling and pain in her right and left arms, due to arthritis flare up. Pt using arms to talk, no distress noted.

## 2016-01-09 NOTE — ED Provider Notes (Signed)
[facility] Emergency Department Provider Note ____________________________________________  Time seen: Approximately 11:13 AM  I have reviewed the triage vital signs and the nursing notes.   HISTORY  Chief Complaint Joint Swelling   HPI Janice Short is a 55 y.o. female is here with complaint of joint swelling inflamed since chest today. Patient states that she has a history of arthritis in her arthritis has flared up. Currently she is not taking any arthritis medicine and her doctor only has her on gabapentin at this time. She states that she has not taken any over-the-counter medication as she was not sure what she can take. She states that she did not go to work yesterday secondary to her pain and was unable to go today as well. She denies any fever or chills. She has had no nausea or vomiting. Pain this time as a 10 over 10.   Past Medical History  Diagnosis Date  . Tenosynovitis of wrist   . Corns and callosity   . SVT (supraventricular tachycardia) ([place])   . Obesity   . Chronic osteoarthritis   . Cervical radiculitis   . Microalbuminuria   . Impingement syndrome of left shoulder   . Carpal tunnel syndrome on left   . Lumbosacral neuritis   . Asthma   . Allergy   . Gout   . Hyperlipidemia   . Vitamin D deficiency   . Dermatophytosis of foot   . Hypertension   . Diabetes mellitus without complication [facility])     Patient Active Problem List   Diagnosis Date Noted  . Carpal tunnel syndrome 07/01/2015  . Cervical radiculitis 07/01/2015  . Arthritis sicca 07/01/2015  . Corn or callus 07/01/2015  . Impingement syndrome of shoulder 07/01/2015  . Microalbuminuria 07/01/2015  . Morbid obesity ([place]) 07/01/2015  . Supraventricular tachycardia ([place]) 07/01/2015  . Compulsive tobacco user syndrome 07/01/2015  . Tenosynovitis of wrist 07/01/2015  . Benign hypertension 11/05/2009  . Diabetes mellitus type 2, uncontrolled ([place]) 11/05/2009  . Athlete's  foot 08/05/2009  . Combined fat and carbohydrate induced hyperlipemia 01/08/2009  . Vitamin D deficiency 01/08/2009  . Asthma, mild intermittent 12/28/2008  . Allergic rhinitis 12/28/2008  . Lumbosacral neuritis 08/11/2007    Past Surgical History  Procedure Laterality Date  . Abdominal hysterectomy      Current Outpatient Rx  Name  Route  Sig  Dispense  Refill  . acetaminophen (TYLENOL) 500 MG tablet   Oral   Take 1 tablet by mouth at bedtime as needed.         Marland Kitchen aspirin 81 MG tablet   Oral   Take 1 tablet by mouth daily.         Marland Kitchen atenolol (TENORMIN) 25 MG tablet   Oral   Take 1 tablet (25 mg total) by mouth every evening.   30 tablet   5   . atorvastatin (LIPITOR) 40 MG tablet   Oral   Take 1 tablet (40 mg total) by mouth at bedtime.   30 tablet   5   . diclofenac sodium (VOLTAREN) 1 % GEL   Topical   Apply 4 g topically 4 (four) times daily.   100 g   5   . etodolac (LODINE) 400 MG tablet   Oral   Take 1 tablet (400 mg total) by mouth 2 (two) times daily.   20 tablet   0   . fluticasone (FLONASE) 50 MCG/ACT nasal spray   Nasal   Place 2  sprays into the nose at bedtime.         . gabapentin (NEURONTIN) 100 MG capsule   Oral   Take 1 capsule (100 mg total) by mouth at bedtime. First 3 days, after that 2 qhs for 3 days followed by 3 qhs after that   90 capsule   0   . Linagliptin-Metformin HCl ER (JENTADUETO XR) 2.04-999 MG TB24   Oral   Take 2 tablets by mouth daily.   60 tablet   2     In place of Kombyglize per insurance request   . losartan-hydrochlorothiazide (HYZAAR) 50-12.5 MG tablet   Oral   Take 1 tablet by mouth daily.   30 tablet   5   . meloxicam (MOBIC) 15 MG tablet   Oral   Take 1 tablet (15 mg total) by mouth daily.   30 tablet   2   . traMADol (ULTRAM) 50 MG tablet   Oral   Take 1 tablet (50 mg total) by mouth every 12 (twelve) hours as needed for moderate pain.   60 tablet   2     Allergies Review of  patient's allergies indicates no known allergies.  Family History  Problem Relation Age of Onset  . Heart attack Mother   . CAD Mother   . Kidney disease Father     Social History Social History  Substance Use Topics  . Smoking status: Current Some Day Smoker  . Smokeless tobacco: Never Used  . Alcohol Use: No    Review of Systems Constitutional: No fever/chills Cardiovascular: Denies chest pain. Respiratory: Denies shortness of breath. Gastrointestinal: No abdominal pain.  No nausea, no vomiting.  Denies ulcer disease. Genitourinary: Negative for dysuria. Musculoskeletal: Bilateral hand pain. Skin: Negative for rash. Neurological: Negative for headaches, focal weakness or numbness.  10-point ROS otherwise negative.  ____________________________________________   PHYSICAL EXAM:  VITAL SIGNS: ED Triage Vitals  Enc Vitals Group     BP 01/09/16 1005 142/75 mmHg     Pulse Rate 01/09/16 1005 86     Resp 01/09/16 1005 18     Temp 01/09/16 1005 97.7 F (36.5 C)     Temp Source 01/09/16 1005 Oral     SpO2 01/09/16 1005 97 %     Weight 01/09/16 1005 210 lb (95.255 kg)     Height 01/09/16 1005 '5\' 5"'$  (1.651 m)     Head Cir --      Peak Flow --      Pain Score 01/09/16 1005 10     Pain Loc --      Pain Edu? --      Excl. in [place]? --     Constitutional: Alert and oriented. Well appearing and in no acute distress. Eyes: Conjunctivae are normal. PERRL. EOMI. Head: Atraumatic. Nose: No congestion/rhinnorhea. Neck: No stridor.   Cardiovascular: Normal rate, regular rhythm. Grossly normal heart sounds.  Good peripheral circulation. Respiratory: Normal respiratory effort.  No retractions. Lungs CTAB. Gastrointestinal: Soft and nontender. No distention.  Musculoskeletal: Moves upper and lower extremities without any assistance. I lateral hands there are joints in digits bilaterally that are tender but no erythema and no warmth. Range of motion is decreased secondary to  stiffness and discomfort. Motor sensory function intact. Joints appear to be degenerative in nature. Neurologic:  Normal speech and language. No gross focal neurologic deficits are appreciated. No gait instability. Skin:  Skin is warm, dry and intact. No rash noted. No warmth, erythema, ecchymosis or abrasions  are noted. Psychiatric: Mood and affect are normal. Speech and behavior are normal.  ____________________________________________   LABS (all labs ordered are listed, but only abnormal results are displayed)  Labs Reviewed - No data to display  PROCEDURES  Procedure(s) performed: None  Critical Care performed: No  ____________________________________________   INITIAL IMPRESSION / ASSESSMENT AND PLAN / ED COURSE  Pertinent labs & imaging results that were available during my care of the patient were reviewed by me and considered in my medical decision making (see chart for details).  Patient was placed on etodolac twice a day with food and she is to follow-up with her primary care doctor if any continued problems. Patient was given a note for work. She is also told she could takes Baring amounts of Tylenol in between should she need anything extra for her joint pain. ____________________________________________   FINAL CLINICAL IMPRESSION(S) / ED DIAGNOSES  Final diagnoses:  Arthritis of both hands      Johnn Hai, PA-C 01/09/16 1330  Daymon Larsen, MD 01/09/16 1334

## 2016-01-09 NOTE — ED Notes (Signed)
Hx of arthritis  And joints became swollen and inflamed yesterday

## 2016-01-13 ENCOUNTER — Ambulatory Visit: Payer: BC Managed Care – PPO | Admitting: Family Medicine

## 2016-01-20 ENCOUNTER — Other Ambulatory Visit: Payer: Self-pay

## 2016-01-20 DIAGNOSIS — M2550 Pain in unspecified joint: Secondary | ICD-10-CM

## 2016-01-20 MED ORDER — ETODOLAC 400 MG PO TABS: 400.0000 mg | ORAL_TABLET | Freq: Two times a day (BID) | ORAL | Status: DC

## 2016-01-20 NOTE — Telephone Encounter (Signed)
I will also send referral to Rheumatologist for further evaluation of her joint aches

## 2016-01-20 NOTE — Telephone Encounter (Signed)
Patient requesting refill. Wants the brand name of medication please

## 2016-02-11 ENCOUNTER — Telehealth: Payer: Self-pay | Admitting: Family Medicine

## 2016-02-11 NOTE — Telephone Encounter (Signed)
Pt needs a call back about a referral.

## 2016-02-13 NOTE — Telephone Encounter (Signed)
Attempted to call patient back, her voicemail  is not setup therefore I can't leave a message. Will wait for her to call back.

## 2016-02-26 ENCOUNTER — Other Ambulatory Visit: Payer: Self-pay | Admitting: Family Medicine

## 2016-02-27 NOTE — Telephone Encounter (Signed)
Patient requesting refill. 

## 2016-03-02 ENCOUNTER — Encounter: Payer: Self-pay | Admitting: Medical Oncology

## 2016-03-02 ENCOUNTER — Emergency Department: Payer: BC Managed Care – PPO

## 2016-03-02 ENCOUNTER — Emergency Department
Admission: EM | Admit: 2016-03-02 | Discharge: 2016-03-02 | Disposition: A | Discharge status: Home / Self Care | Payer: BC Managed Care – PPO | Attending: Emergency Medicine | Admitting: Emergency Medicine

## 2016-03-02 DIAGNOSIS — Z7984 Long term (current) use of oral hypoglycemic drugs: Secondary | ICD-10-CM | POA: Insufficient documentation

## 2016-03-02 DIAGNOSIS — Y939 Activity, unspecified: Secondary | ICD-10-CM | POA: Insufficient documentation

## 2016-03-02 DIAGNOSIS — I10 Essential (primary) hypertension: Secondary | ICD-10-CM | POA: Insufficient documentation

## 2016-03-02 DIAGNOSIS — Z90710 Acquired absence of both cervix and uterus: Secondary | ICD-10-CM | POA: Diagnosis not present

## 2016-03-02 DIAGNOSIS — Z79899 Other long term (current) drug therapy: Secondary | ICD-10-CM | POA: Insufficient documentation

## 2016-03-02 DIAGNOSIS — W010XXA Fall on same level from slipping, tripping and stumbling without subsequent striking against object, initial encounter: Secondary | ICD-10-CM | POA: Insufficient documentation

## 2016-03-02 DIAGNOSIS — Y92219 Unspecified school as the place of occurrence of the external cause: Secondary | ICD-10-CM | POA: Insufficient documentation

## 2016-03-02 DIAGNOSIS — M170 Bilateral primary osteoarthritis of knee: Secondary | ICD-10-CM

## 2016-03-02 DIAGNOSIS — F17200 Nicotine dependence, unspecified, uncomplicated: Secondary | ICD-10-CM | POA: Insufficient documentation

## 2016-03-02 DIAGNOSIS — Y990 Civilian activity done for income or pay: Secondary | ICD-10-CM | POA: Diagnosis not present

## 2016-03-02 DIAGNOSIS — E119 Type 2 diabetes mellitus without complications: Secondary | ICD-10-CM | POA: Insufficient documentation

## 2016-03-02 DIAGNOSIS — J45909 Unspecified asthma, uncomplicated: Secondary | ICD-10-CM | POA: Diagnosis not present

## 2016-03-02 DIAGNOSIS — E785 Hyperlipidemia, unspecified: Secondary | ICD-10-CM | POA: Insufficient documentation

## 2016-03-02 DIAGNOSIS — S8002XA Contusion of left knee, initial encounter: Secondary | ICD-10-CM | POA: Diagnosis not present

## 2016-03-02 DIAGNOSIS — S8001XA Contusion of right knee, initial encounter: Secondary | ICD-10-CM | POA: Diagnosis not present

## 2016-03-02 DIAGNOSIS — S8992XA Unspecified injury of left lower leg, initial encounter: Secondary | ICD-10-CM | POA: Diagnosis present

## 2016-03-02 DIAGNOSIS — Z7982 Long term (current) use of aspirin: Secondary | ICD-10-CM | POA: Diagnosis not present

## 2016-03-02 MED ORDER — TRAMADOL HCL 50 MG PO TABS: ORAL_TABLET | ORAL | Status: DC

## 2016-03-02 NOTE — ED Notes (Signed)
Pt reports she stumbled and fell while she was at school and hurt both knees. Pt denies LOC denies dizziness. Reports she has a bad knee anyway and that she thinks it just gave out.

## 2016-03-02 NOTE — ED Notes (Signed)
States she fell this am  Landed on both knees   No swelling noted

## 2016-03-02 NOTE — ED Provider Notes (Signed)
[facility] Emergency Department Provider Note  ____________________________________________  Time seen: Approximately 10:26 AM  I have reviewed the triage vital signs and the nursing notes.   HISTORY  Chief Complaint Fall and Knee Pain   HPI Janice Short is a 55 y.o. female is here with complaint of bilateral knee pain after she fell at the school system today. Patient states that she has bad knees anyway and that she thinks it "just gave out". She is unaware of anything in the floor that caused her to fall and she denies this being a syncopal episode. Patient has been ambulatory since the accident. She drove herself home picked up her cane and came to the emergency room. Denies any head injury or loss of consciousness during this event. She states that currently she is taking Lodine twice a day with food for her arthritis and that she is waiting for an appointment with the rheumatology department at [facility]. She rates her pain as 8/10.   Past Medical History  Diagnosis Date  . Tenosynovitis of wrist   . Corns and callosity   . SVT (supraventricular tachycardia) ([place])   . Obesity   . Chronic osteoarthritis   . Cervical radiculitis   . Microalbuminuria   . Impingement syndrome of left shoulder   . Carpal tunnel syndrome on left   . Lumbosacral neuritis   . Asthma   . Allergy   . Gout   . Hyperlipidemia   . Vitamin D deficiency   . Dermatophytosis of foot   . Hypertension   . Diabetes mellitus without complication [facility])     Patient Active Problem List   Diagnosis Date Noted  . Carpal tunnel syndrome 07/01/2015  . Cervical radiculitis 07/01/2015  . Arthritis sicca 07/01/2015  . Corn or callus 07/01/2015  . Impingement syndrome of shoulder 07/01/2015  . Microalbuminuria 07/01/2015  . Morbid obesity ([place]) 07/01/2015  . Supraventricular tachycardia ([place]) 07/01/2015  . Compulsive tobacco user syndrome 07/01/2015  . Tenosynovitis of wrist  07/01/2015  . Benign hypertension 11/05/2009  . Diabetes mellitus type 2, uncontrolled ([place]) 11/05/2009  . Athlete's foot 08/05/2009  . Combined fat and carbohydrate induced hyperlipemia 01/08/2009  . Vitamin D deficiency 01/08/2009  . Asthma, mild intermittent 12/28/2008  . Allergic rhinitis 12/28/2008  . Lumbosacral neuritis 08/11/2007    Past Surgical History  Procedure Laterality Date  . Abdominal hysterectomy      Current Outpatient Rx  Name  Route  Sig  Dispense  Refill  . acetaminophen (TYLENOL) 500 MG tablet   Oral   Take 1 tablet by mouth at bedtime as needed.         Marland Kitchen aspirin 81 MG tablet   Oral   Take 1 tablet by mouth daily.         Marland Kitchen atenolol (TENORMIN) 25 MG tablet   Oral   Take 1 tablet (25 mg total) by mouth every evening.   30 tablet   5   . atorvastatin (LIPITOR) 40 MG tablet   Oral   Take 1 tablet (40 mg total) by mouth at bedtime.   30 tablet   5   . diclofenac sodium (VOLTAREN) 1 % GEL   Topical   Apply 4 g topically 4 (four) times daily.   100 g   5   . etodolac (LODINE) 400 MG tablet      TAKE ONE TABLET BY MOUTH TWICE DAILY   60 tablet   0   .  fluticasone (FLONASE) 50 MCG/ACT nasal spray   Nasal   Place 2 sprays into the nose at bedtime.         . gabapentin (NEURONTIN) 100 MG capsule   Oral   Take 1 capsule (100 mg total) by mouth at bedtime. First 3 days, after that 2 qhs for 3 days followed by 3 qhs after that   90 capsule   0   . Linagliptin-Metformin HCl ER (JENTADUETO XR) 2.04-999 MG TB24   Oral   Take 2 tablets by mouth daily.   60 tablet   2     In place of Kombyglize per insurance request   . losartan-hydrochlorothiazide (HYZAAR) 50-12.5 MG tablet   Oral   Take 1 tablet by mouth daily.   30 tablet   5   . meloxicam (MOBIC) 15 MG tablet   Oral   Take 1 tablet (15 mg total) by mouth daily.   30 tablet   2   . traMADol (ULTRAM) 50 MG tablet      Take 1 tablet q 8 hours prn severe pain   20 tablet    0     Allergies Review of patient's allergies indicates no known allergies.  Family History  Problem Relation Age of Onset  . Heart attack Mother   . CAD Mother   . Kidney disease Father     Social History Social History  Substance Use Topics  . Smoking status: Current Some Day Smoker  . Smokeless tobacco: Never Used  . Alcohol Use: No    Review of Systems Constitutional: No fever/chills Cardiovascular: Denies chest pain. Respiratory: Denies shortness of breath. Gastrointestinal:   No nausea, no vomiting.   Musculoskeletal: Bilateral knee pain, chronic low back pain. Neurological: Negative for headaches, focal weakness or numbness.  10-point ROS otherwise negative.  ____________________________________________   PHYSICAL EXAM:  VITAL SIGNS: ED Triage Vitals  Enc Vitals Group     BP 03/02/16 1005 113/62 mmHg     Pulse Rate 03/02/16 1005 89     Resp 03/02/16 1005 17     Temp 03/02/16 1005 98.6 F (37 C)     Temp Source 03/02/16 1005 Oral     SpO2 03/02/16 1005 99 %     Weight 03/02/16 1005 210 lb (95.255 kg)     Height 03/02/16 1005 '5\' 6"'$  (1.676 m)     Head Cir --      Peak Flow --      Pain Score 03/02/16 1006 8     Pain Loc --      Pain Edu? --      Excl. in [place]? --     Constitutional: Alert and oriented. Well appearing and in no acute distress. Eyes: Conjunctivae are normal. PERRL. EOMI. Head: Atraumatic. Nose: No congestion/rhinnorhea. Neck: No stridor.   Cardiovascular: Normal rate, regular rhythm. Grossly normal heart sounds.  Good peripheral circulation. Respiratory: Normal respiratory effort.  No retractions. Lungs CTAB. Musculoskeletal: Examination of bilateral knees moderate tenderness on palpation anteriorly. No gross deformity was noted however there is degenerative changes present. Range of motion is slightly restricted secondary to discomfort and crepitus is noted. There is no obvious effusion present. Patient is able to bear weight which  increases her pain. Bilateral ankles are nontender. There is some diffuse low back tenderness but no localized tenderness to palpation and range of motion is without muscle spasms. Neurologic:  Normal speech and language. No gross focal neurologic deficits are appreciated. No gait instability.  Skin:  Skin is warm, dry and intact. No rash noted. No abrasions, ecchymosis, or erythema. Psychiatric: Mood and affect are normal. Speech and behavior are normal.  ____________________________________________   LABS (all labs ordered are listed, but only abnormal results are displayed)  Labs Reviewed - No data to display   RADIOLOGY  Left knee x-ray per radiologist shows advanced tricompartmental degenerative osteoarthritis with small suprapatellar joint effusion and also ossified loose body. Right knee shows advanced degenerative osteoarthritis.. Joint effusion probably chronic. No fracture or dislocation noted per radiologist. Leana Gamer, personally viewed and evaluated these images (plain radiographs) as part of my medical decision making, as well as reviewing the written report by the radiologist. ____________________________________________   PROCEDURES  Procedure(s) performed: None  Critical Care performed: No  ____________________________________________   INITIAL IMPRESSION / ASSESSMENT AND PLAN / ED COURSE  Pertinent labs & imaging results that were available during my care of the patient were reviewed by me and considered in my medical decision making (see chart for details).  This is currently taking Lodine for arthritis. Tramadol prescription was given to patient today take 1 for pain as needed. Patient is taken this in the past has not had any GI upset for adverse reaction. Patient is to call her primary care doctor to follow up with a appointment with a rheumatologist. ____________________________________________   FINAL CLINICAL IMPRESSION(S) / ED  DIAGNOSES  Final diagnoses:  Knee contusion, left, initial encounter  Knee contusion, right, initial encounter  Osteoarthritis of both knees, unspecified osteoarthritis type      Johnn Hai, PA-C 03/02/16 1231  Carrie Mew, MD 03/02/16 1553

## 2016-03-02 NOTE — Discharge Instructions (Signed)
Contusion A contusion is a deep bruise. Contusions happen when an injury causes bleeding under the skin. Symptoms of bruising include pain, swelling, and discolored skin. The skin may turn blue, purple, or yellow. HOME CARE   Rest the injured area.  If told, put ice on the injured area.  Put ice in a plastic bag.  Place a towel between your skin and the bag.  Leave the ice on for 20 minutes, 2-3 times per day.  If told, put light pressure (compression) on the injured area using an elastic bandage. Make sure the bandage is not too tight. Remove it and put it back on as told by your doctor.  If possible, raise (elevate) the injured area above the level of your heart while you are sitting or lying down.  Take over-the-counter and prescription medicines only as told by your doctor. GET HELP IF:  Your symptoms do not get better after several days of treatment.  Your symptoms get worse.  You have trouble moving the injured area. GET HELP RIGHT AWAY IF:   You have very bad pain.  You have a loss of feeling (numbness) in a hand or foot.  Your hand or foot turns pale or cold.   This information is not intended to replace advice given to you by your health care provider. Make sure you discuss any questions you have with your health care provider.   Document Released: 05/11/2008 Document Revised: 08/14/2015 Document Reviewed: 04/10/2015 Elsevier Interactive Patient Education 2016 [place] and elevate as needed for swelling. Continue your arthritis medicine as directed. Tramadol as needed for severe pain. Call your doctor for further information about referral to rheumatologist.

## 2016-03-27 ENCOUNTER — Other Ambulatory Visit: Payer: Self-pay | Admitting: Family Medicine

## 2016-03-27 DIAGNOSIS — I10 Essential (primary) hypertension: Secondary | ICD-10-CM

## 2016-03-27 DIAGNOSIS — E1121 Type 2 diabetes mellitus with diabetic nephropathy: Secondary | ICD-10-CM

## 2016-03-27 NOTE — Telephone Encounter (Signed)
Requesting refill on Etodolac (one pill left) and losartan. Please send to [facility]

## 2016-03-27 NOTE — Telephone Encounter (Signed)
Refill request was sent to Dr. Krichna Sowles for approval and submission.  

## 2016-03-29 MED ORDER — LOSARTAN POTASSIUM-HCTZ 50-12.5 MG PO TABS: 1.0000 | ORAL_TABLET | Freq: Every day | ORAL | Status: DC

## 2016-03-30 NOTE — Telephone Encounter (Signed)
Patient informed. 

## 2016-04-27 ENCOUNTER — Telehealth: Payer: Self-pay | Admitting: Family Medicine

## 2016-04-27 NOTE — Telephone Encounter (Signed)
Patient is requesting a prescription for her arthritis cream and also a refill on her asthma inhaler. Please send both to [facility]

## 2016-04-27 NOTE — Telephone Encounter (Signed)
She needs follow up, last visit was Dec

## 2016-04-28 NOTE — Telephone Encounter (Signed)
Patient informed and stated that she was at work and does not get off until 2p. I suggested that we schedule appointment for a different day and anytime after 2. She said she will call back and hung up.

## 2016-06-04 ENCOUNTER — Telehealth: Payer: Self-pay

## 2016-06-04 NOTE — Telephone Encounter (Signed)
I will no longer rx her any controlled medications because of her comment of getting medication off the streets. Sorry

## 2016-06-04 NOTE — Telephone Encounter (Signed)
Patient called stating that her back went out and she is in a lot of pain. She stated that she tried to get back in with [facility] but they said she needed a new referral. I then called their office and she informed me that this patient was a no show but that they could get her in on 06/22/16 at 8:30am.  I called the patient back and told her about the appointment and strongly encouraged her to call at least 24 hrs prior to her visit if she could not make it. She then asked what is she suppose to do until then. I said that I would send Dr. Ancil Boozer a message and would give her a call back.  Please advise.  She uses [place]

## 2016-06-04 NOTE — Telephone Encounter (Signed)
Does she still have Tramadol?

## 2016-06-04 NOTE — Telephone Encounter (Signed)
Patient stated that she has been trying to get in with Dr. Ancil Boozer for 3 weeks and has not been able to.   She said, "y'all can have that Tramadol cuz that doesn't do nothing for me, I just get something off the streets."  I informed her that I will see if there was anything else Dr. Ruthine Dose could offer.

## 2016-06-22 DIAGNOSIS — M170 Bilateral primary osteoarthritis of knee: Secondary | ICD-10-CM | POA: Insufficient documentation

## 2016-06-22 DIAGNOSIS — G8929 Other chronic pain: Secondary | ICD-10-CM | POA: Insufficient documentation

## 2016-06-22 DIAGNOSIS — M5440 Lumbago with sciatica, unspecified side: Secondary | ICD-10-CM

## 2016-06-29 ENCOUNTER — Other Ambulatory Visit: Payer: Self-pay | Admitting: Internal Medicine

## 2016-06-29 DIAGNOSIS — M5441 Lumbago with sciatica, right side: Principal | ICD-10-CM

## 2016-06-29 DIAGNOSIS — M5442 Lumbago with sciatica, left side: Principal | ICD-10-CM

## 2016-06-29 DIAGNOSIS — G8929 Other chronic pain: Secondary | ICD-10-CM

## 2016-07-08 ENCOUNTER — Ambulatory Visit
Admission: RE | Admit: 2016-07-08 | Discharge: 2016-07-08 | Disposition: A | Discharge status: Home / Self Care | Payer: BLUE CROSS/BLUE SHIELD | Source: Ambulatory Visit | Attending: Internal Medicine | Admitting: Internal Medicine

## 2016-07-08 DIAGNOSIS — G8929 Other chronic pain: Secondary | ICD-10-CM

## 2016-07-08 DIAGNOSIS — M5442 Lumbago with sciatica, left side: Secondary | ICD-10-CM | POA: Insufficient documentation

## 2016-07-08 DIAGNOSIS — M5126 Other intervertebral disc displacement, lumbar region: Secondary | ICD-10-CM | POA: Insufficient documentation

## 2016-07-08 DIAGNOSIS — M5441 Lumbago with sciatica, right side: Secondary | ICD-10-CM | POA: Insufficient documentation

## 2016-07-08 DIAGNOSIS — M4806 Spinal stenosis, lumbar region: Secondary | ICD-10-CM | POA: Insufficient documentation

## 2016-07-14 ENCOUNTER — Encounter: Payer: Self-pay | Admitting: Family Medicine

## 2016-07-14 ENCOUNTER — Ambulatory Visit (INDEPENDENT_AMBULATORY_CARE_PROVIDER_SITE_OTHER): Payer: BLUE CROSS/BLUE SHIELD | Admitting: Family Medicine

## 2016-07-14 VITALS — BP 122/72 | HR 90 | Temp 97.7°F | Resp 18 | Ht 66.0 in | Wt 211.1 lb

## 2016-07-14 DIAGNOSIS — G8929 Other chronic pain: Secondary | ICD-10-CM

## 2016-07-14 DIAGNOSIS — E118 Type 2 diabetes mellitus with unspecified complications: Secondary | ICD-10-CM | POA: Diagnosis not present

## 2016-07-14 DIAGNOSIS — IMO0002 Reserved for concepts with insufficient information to code with codable children: Secondary | ICD-10-CM

## 2016-07-14 DIAGNOSIS — Z794 Long term (current) use of insulin: Secondary | ICD-10-CM

## 2016-07-14 DIAGNOSIS — E1142 Type 2 diabetes mellitus with diabetic polyneuropathy: Secondary | ICD-10-CM | POA: Diagnosis not present

## 2016-07-14 DIAGNOSIS — M5440 Lumbago with sciatica, unspecified side: Secondary | ICD-10-CM

## 2016-07-14 DIAGNOSIS — E785 Hyperlipidemia, unspecified: Secondary | ICD-10-CM | POA: Diagnosis not present

## 2016-07-14 DIAGNOSIS — I10 Essential (primary) hypertension: Secondary | ICD-10-CM | POA: Diagnosis not present

## 2016-07-14 DIAGNOSIS — E1165 Type 2 diabetes mellitus with hyperglycemia: Secondary | ICD-10-CM

## 2016-07-14 DIAGNOSIS — R809 Proteinuria, unspecified: Secondary | ICD-10-CM

## 2016-07-14 DIAGNOSIS — M170 Bilateral primary osteoarthritis of knee: Secondary | ICD-10-CM | POA: Diagnosis not present

## 2016-07-14 LAB — POCT GLYCOSYLATED HEMOGLOBIN (HGB A1C): HEMOGLOBIN A1C: 13.5

## 2016-07-14 MED ORDER — GABAPENTIN 300 MG PO CAPS: 300.0000 mg | ORAL_CAPSULE | Freq: Three times a day (TID) | ORAL | 2 refills | Status: DC

## 2016-07-14 MED ORDER — INSULIN DEGLUDEC 200 UNIT/ML ~~LOC~~ SOPN: 10.0000 [IU] | PEN_INJECTOR | Freq: Every day | SUBCUTANEOUS | 0 refills | Status: DC

## 2016-07-14 MED ORDER — ATORVASTATIN CALCIUM 40 MG PO TABS: 40.0000 mg | ORAL_TABLET | Freq: Every day | ORAL | 5 refills | Status: DC

## 2016-07-14 MED ORDER — DICLOFENAC SODIUM 1 % TD GEL: 4.0000 g | Freq: Four times a day (QID) | TRANSDERMAL | 5 refills | Status: DC

## 2016-07-14 MED ORDER — LINAGLIPTIN-METFORMIN HCL ER 2.5-1000 MG PO TB24: 2.0000 | ORAL_TABLET | Freq: Every day | ORAL | 2 refills | Status: DC

## 2016-07-14 MED ORDER — LOSARTAN POTASSIUM-HCTZ 50-12.5 MG PO TABS: 1.0000 | ORAL_TABLET | Freq: Every day | ORAL | 2 refills | Status: DC

## 2016-07-14 NOTE — Progress Notes (Signed)
Name: Janice Short   MRN: [id number]    DOB: [birth year]   Date:07/14/2016       Progress Note  Subjective  Chief Complaint  Chief Complaint  Patient presents with  . Medication Refill  . Diabetes  . Hyperlipidemia  . Hypertension    HPI  DMII with nephropathy and also neuropathy: she is not very compliant with medication because of cost and inability to afford medication. 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  HgbA1C is even higher. From 8.3 % to 13.9% today. She denies polyphagia, but has polydipsia and polyuria. No blurred vision. She is not checking her fsbs at home because she does not have a glucometer. She used to take Gabapentin for numbness on leg and ARB for proteinuria but also out of medication . She is not compliant with diet, drinking sodas.   HTN: last dose of medication was yesterday am, bp is normal. We will stop Atenolol. She denies chest pain or palpitation  Asthma Mild Intermittent: usually pregnant in the Spring and when very hot. Currently no wheezing, no cough, no SOB  Hyperlipidemia: out of Lipitor , she has muscle aches and eats mustard for it.   Joint deformity and joint pain: her sister has RA, she was seen by Rheumatologist, and was referred to Ortho, has DDD and stenosis with S1 compression and radiculitis. Also OA hands. Taking Tylenol , and would like a refill of voltargen gel. Resume Gabapenin, needs to see pain clinic if she needs further pain management  Obesity: she has gained one pound, still drinking sodas and eating fast food. Discussed life style modification     Patient Active Problem List   Diagnosis Date Noted  . Diabetic polyneuropathy associated with type 2 diabetes mellitus ([place]) 07/14/2016  . Chronic midline low back pain with sciatica 06/22/2016  . Dyslipidemia 06/22/2016  . Primary osteoarthritis of both knees 06/22/2016  . Carpal tunnel syndrome 07/01/2015  . Cervical radiculitis 07/01/2015  .  Corn or callus 07/01/2015  . Impingement syndrome of shoulder 07/01/2015  . Microalbuminuria 07/01/2015  . Morbid obesity ([place]) 07/01/2015  . Supraventricular tachycardia ([place]) 07/01/2015  . Compulsive tobacco user syndrome 07/01/2015  . Tenosynovitis of wrist 07/01/2015  . Benign hypertension 11/05/2009  . Uncontrolled type 2 diabetes mellitus with microalbuminuria ([place]) 11/05/2009  . Athlete's foot 08/05/2009  . Combined fat and carbohydrate induced hyperlipemia 01/08/2009  . Vitamin D deficiency 01/08/2009  . Asthma, mild intermittent 12/28/2008  . Allergic rhinitis 12/28/2008  . Lumbosacral neuritis 08/11/2007    Past Surgical History:  Procedure Laterality Date  . ABDOMINAL HYSTERECTOMY      Family History  Problem Relation Age of Onset  . Heart attack Mother   . CAD Mother   . Kidney disease Father     Social History   Social History  . Marital status: Single    Spouse name: N/A  . Number of children: N/A  . Years of education: N/A   Occupational History  . Not on file.   Social History Main Topics  . Smoking status: Current Some Day Smoker  . Smokeless tobacco: Never Used  . Alcohol use No  . Drug use: No  . Sexual activity: Yes   Other Topics Concern  . Not on file   Social History Narrative  . No narrative on file     Current Outpatient Prescriptions:  .  acetaminophen (TYLENOL) 500 MG tablet, Take 1 tablet by mouth  at bedtime as needed., Disp: , Rfl:  .  aspirin 81 MG tablet, Take 1 tablet by mouth daily., Disp: , Rfl:  .  atorvastatin (LIPITOR) 40 MG tablet, Take 1 tablet (40 mg total) by mouth at bedtime., Disp: 30 tablet, Rfl: 5 .  diclofenac sodium (VOLTAREN) 1 % GEL, Apply 4 g topically 4 (four) times daily., Disp: 100 g, Rfl: 5 .  fluticasone (FLONASE) 50 MCG/ACT nasal spray, Place 2 sprays into the nose at bedtime., Disp: , Rfl:  .  gabapentin (NEURONTIN) 300 MG capsule, Take 1 capsule (300 mg total) by mouth 3 (three) times daily. First 3  days, after that 2 qhs for 3 days followed by 3 qhs after that, Disp: 90 capsule, Rfl: 2 .  Insulin Degludec (TRESIBA FLEXTOUCH) 200 UNIT/ML SOPN, Inject 10 Units into the skin daily., Disp: 1 pen, Rfl: 0 .  Linagliptin-Metformin HCl ER (JENTADUETO XR) 2.04-999 MG TB24, Take 2 tablets by mouth daily., Disp: 60 tablet, Rfl: 2 .  losartan-hydrochlorothiazide (HYZAAR) 50-12.5 MG tablet, Take 1 tablet by mouth daily., Disp: 30 tablet, Rfl: 2  No Known Allergies   ROS  Constitutional: Negative for fever or weight change.  Respiratory: Negative for cough and shortness of breath.   Cardiovascular: Negative for chest pain or palpitations.  Gastrointestinal: Negative for abdominal pain, no bowel changes.  Musculoskeletal: Positive  for gait problem or joint swelling.  Skin: Negative for rash.  Neurological: Negative for dizziness or headache.  No other specific complaints in a complete review of systems (except as listed in HPI above).  Objective  Vitals:   07/14/16 0834  BP: 122/72  Pulse: 90  Resp: 18  Temp: 97.7 F (36.5 C)  SpO2: 90%  Weight: 211 lb 1 oz (95.7 kg)  Height: '5\' 6"'$  (1.676 m)    Body mass index is 34.07 kg/m.  Physical Exam  Constitutional: Patient appears well-developed and well-nourished. Obese  No distress.  HEENT: head atraumatic, normocephalic, pupils equal and reactive to light,  neck supple, throat within normal limits Cardiovascular: Normal rate, regular rhythm and normal heart sounds.  No murmur heard. No BLE edema. Pulmonary/Chest: Effort normal and breath sounds normal. No respiratory distress. Abdominal: Soft.  There is no tenderness. Psychiatric: Patient has a normal mood and affect. behavior is normal. Judgment and thought content normal. Muscular Skeletal: deformities both hands from OA, pain and mild effusion with extension of both knees. Pain during palpation of lumbar spine    Recent Results (from the past 2160 hour(s))  POCT HgB A1C      Status: None   Collection Time: 07/14/16  8:46 AM  Result Value Ref Range   Hemoglobin A1C 13.5     Diabetic Foot Exam: Diabetic Foot Exam - Simple   Simple Foot Form Diabetic Foot exam was performed with the following findings:  Yes 07/14/2016  9:18 AM  Visual Inspection See comments:  Yes Sensation Testing Intact to touch and monofilament testing bilaterally:  Yes Pulse Check Posterior Tibialis and Dorsalis pulse intact bilaterally:  Yes Comments Bunion bilaterally, thick nails, maceration on toe web     PHQ2/9: Depression screen [facility] 2/9 07/14/2016 11/19/2015  Decreased Interest 0 0  Down, Depressed, Hopeless 1 0  PHQ - 2 Score 1 0     Fall Risk: Fall Risk  07/14/2016 11/19/2015  Falls in the past year? Yes Yes  Number falls in past yr: 1 1  Injury with Fall? Yes Yes  Follow up Falls evaluation completed -  Functional Status Survey: Is the patient deaf or have difficulty hearing?: No Does the patient have difficulty seeing, even when wearing glasses/contacts?: No Does the patient have difficulty concentrating, remembering, or making decisions?: No Does the patient have difficulty walking or climbing stairs?: Yes Does the patient have difficulty dressing or bathing?: No Does the patient have difficulty doing errands alone such as visiting a doctor's office or shopping?: No    Assessment & Plan  1. Uncontrolled type 2 diabetes mellitus with complication, with long-term current use of insulin (HCC)  We will give her samples of Tyler Aas, she is afraid to change oral medication, we will give her a voucher ,explained importance of taking Tresiba to bring glucose down  - POCT HgB A1C - losartan-hydrochlorothiazide (HYZAAR) 50-12.5 MG tablet; Take 1 tablet by mouth daily.  Dispense: 30 tablet; Refill: 2 - Linagliptin-Metformin HCl ER (JENTADUETO XR) 2.04-999 MG TB24; Take 2 tablets by mouth daily.  Dispense: 60 tablet; Refill: 2 - Insulin Degludec (TRESIBA FLEXTOUCH)  200 UNIT/ML SOPN; Inject 10 Units into the skin daily.  Dispense: 1 pen; Refill: 0  2. Benign hypertension  - losartan-hydrochlorothiazide (HYZAAR) 50-12.5 MG tablet; Take 1 tablet by mouth daily.  Dispense: 30 tablet; Refill: 2  3. Dyslipidemia  - atorvastatin (LIPITOR) 40 MG tablet; Take 1 tablet (40 mg total) by mouth at bedtime.  Dispense: 30 tablet; Refill: 5  4. Primary osteoarthritis of both knees  - diclofenac sodium (VOLTAREN) 1 % GEL; Apply 4 g topically 4 (four) times daily.  Dispense: 100 g; Refill: 5  5. Chronic midline low back pain with sciatica, sciatica laterality unspecified  - gabapentin (NEURONTIN) 300 MG capsule; Take 1 capsule (300 mg total) by mouth 3 (three) times daily. First 3 days, after that 2 qhs for 3 days followed by 3 qhs after that  Dispense: 90 capsule; Refill: 2  6. Microalbuminuria  Needs to take ARB daily   7. Morbid obesity, unspecified obesity type [facility])  Discussed with the patient the risk posed by an increased BMI. Discussed importance of portion control, calorie counting and at least 150 minutes of physical activity weekly. Avoid sweet beverages and drink more water. Eat at least 6 servings of fruit and vegetables daily   8. Diabetic polyneuropathy associated with type 2 diabetes mellitus (HCC)  Resume Gabapentin

## 2016-07-15 ENCOUNTER — Telehealth: Payer: Self-pay

## 2016-07-15 DIAGNOSIS — R809 Proteinuria, unspecified: Secondary | ICD-10-CM

## 2016-07-15 DIAGNOSIS — E1165 Type 2 diabetes mellitus with hyperglycemia: Principal | ICD-10-CM

## 2016-07-15 DIAGNOSIS — IMO0002 Reserved for concepts with insufficient information to code with codable children: Secondary | ICD-10-CM

## 2016-07-15 DIAGNOSIS — E1129 Type 2 diabetes mellitus with other diabetic kidney complication: Principal | ICD-10-CM

## 2016-07-15 NOTE — Telephone Encounter (Signed)
Pharmacist states we need a update prescription due to patient was informed she is suppose to titrate up 2 units every 2 days until patient reach BS of 100-130 every morning. Can you please change prescription and resent to pharmacy. Thanks.

## 2016-07-15 NOTE — Telephone Encounter (Signed)
Patient was given a free meter kit but needs supplies sent to pharmacy please.

## 2016-07-16 ENCOUNTER — Other Ambulatory Visit: Payer: Self-pay | Admitting: Family Medicine

## 2016-07-16 DIAGNOSIS — E118 Type 2 diabetes mellitus with unspecified complications: Principal | ICD-10-CM

## 2016-07-16 DIAGNOSIS — Z794 Long term (current) use of insulin: Principal | ICD-10-CM

## 2016-07-16 DIAGNOSIS — E1165 Type 2 diabetes mellitus with hyperglycemia: Secondary | ICD-10-CM

## 2016-07-16 DIAGNOSIS — IMO0002 Reserved for concepts with insufficient information to code with codable children: Secondary | ICD-10-CM

## 2016-07-16 MED ORDER — INSULIN DEGLUDEC 200 UNIT/ML ~~LOC~~ SOPN: 50.0000 [IU] | PEN_INJECTOR | Freq: Every day | SUBCUTANEOUS | 0 refills | Status: DC

## 2016-07-16 NOTE — Telephone Encounter (Signed)
Please find out the name of meter and order it for her. Thank you

## 2016-07-16 NOTE — Telephone Encounter (Signed)
I put them in our system and sent them to Dr. Ancil Boozer as a medication refill.

## 2016-07-21 MED ORDER — BAYER MICROLET LANCETS MISC: 12 refills | Status: DC

## 2016-07-21 MED ORDER — GLUCOSE BLOOD VI STRP: ORAL_STRIP | 12 refills | Status: DC

## 2016-07-29 ENCOUNTER — Other Ambulatory Visit: Payer: Self-pay | Admitting: Family Medicine

## 2016-07-29 MED ORDER — GLUCOSE BLOOD VI STRP
ORAL_STRIP | 12 refills | Status: DC
Start: 2016-07-29 — End: 2017-12-23

## 2016-09-09 ENCOUNTER — Ambulatory Visit (INDEPENDENT_AMBULATORY_CARE_PROVIDER_SITE_OTHER): Payer: BLUE CROSS/BLUE SHIELD

## 2016-09-09 DIAGNOSIS — A184 Tuberculosis of skin and subcutaneous tissue: Secondary | ICD-10-CM

## 2016-09-11 LAB — TB SKIN TEST
INDURATION: 0 mm
TB Skin Test: NEGATIVE

## 2016-09-15 ENCOUNTER — Ambulatory Visit: Payer: BLUE CROSS/BLUE SHIELD | Admitting: Pain Medicine

## 2016-09-29 ENCOUNTER — Other Ambulatory Visit: Payer: Self-pay | Admitting: Family Medicine

## 2016-09-29 ENCOUNTER — Telehealth: Payer: Self-pay | Admitting: Family Medicine

## 2016-09-29 DIAGNOSIS — M5440 Lumbago with sciatica, unspecified side: Principal | ICD-10-CM

## 2016-09-29 DIAGNOSIS — G8929 Other chronic pain: Secondary | ICD-10-CM

## 2016-09-29 NOTE — Telephone Encounter (Signed)
Please ask her to contact Dr. Lacinda Axon for a refill. Thank you

## 2016-09-29 NOTE — Telephone Encounter (Signed)
Pt states she needs refill on Gabepentin to be sent to [facility] on [address]. Pt states that Dr Lacinda Axon @ [facility] told her to take 2 at night and 2 in the morning so going by that Dr's instructions it has made her run short and she does not come back to see [place] until next month. Please advise.

## 2016-09-29 NOTE — Telephone Encounter (Signed)
LMOM to inform pt °

## 2016-09-30 ENCOUNTER — Other Ambulatory Visit: Payer: Self-pay | Admitting: Family Medicine

## 2016-09-30 DIAGNOSIS — G8929 Other chronic pain: Secondary | ICD-10-CM

## 2016-09-30 DIAGNOSIS — M5440 Lumbago with sciatica, unspecified side: Principal | ICD-10-CM

## 2016-09-30 NOTE — Telephone Encounter (Signed)
Patient requesting refill of Gabapentin to [facility].  

## 2016-10-14 ENCOUNTER — Ambulatory Visit: Payer: BLUE CROSS/BLUE SHIELD | Admitting: Family Medicine

## 2016-10-15 ENCOUNTER — Encounter: Payer: Self-pay | Admitting: Family Medicine

## 2016-10-15 ENCOUNTER — Ambulatory Visit (INDEPENDENT_AMBULATORY_CARE_PROVIDER_SITE_OTHER): Payer: BLUE CROSS/BLUE SHIELD | Admitting: Family Medicine

## 2016-10-15 VITALS — BP 110/68 | HR 94 | Temp 97.8°F | Resp 16 | Wt 211.4 lb

## 2016-10-15 DIAGNOSIS — Z23 Encounter for immunization: Secondary | ICD-10-CM | POA: Diagnosis not present

## 2016-10-15 DIAGNOSIS — E785 Hyperlipidemia, unspecified: Secondary | ICD-10-CM

## 2016-10-15 DIAGNOSIS — I10 Essential (primary) hypertension: Secondary | ICD-10-CM

## 2016-10-15 DIAGNOSIS — E1142 Type 2 diabetes mellitus with diabetic polyneuropathy: Secondary | ICD-10-CM | POA: Diagnosis not present

## 2016-10-15 DIAGNOSIS — M4800 Spinal stenosis, site unspecified: Secondary | ICD-10-CM | POA: Insufficient documentation

## 2016-10-15 DIAGNOSIS — G8929 Other chronic pain: Secondary | ICD-10-CM | POA: Diagnosis not present

## 2016-10-15 DIAGNOSIS — M4807 Spinal stenosis, lumbosacral region: Secondary | ICD-10-CM

## 2016-10-15 DIAGNOSIS — M5440 Lumbago with sciatica, unspecified side: Secondary | ICD-10-CM

## 2016-10-15 DIAGNOSIS — R809 Proteinuria, unspecified: Secondary | ICD-10-CM | POA: Diagnosis not present

## 2016-10-15 DIAGNOSIS — M4802 Spinal stenosis, cervical region: Secondary | ICD-10-CM | POA: Insufficient documentation

## 2016-10-15 DIAGNOSIS — IMO0002 Reserved for concepts with insufficient information to code with codable children: Secondary | ICD-10-CM

## 2016-10-15 DIAGNOSIS — E1165 Type 2 diabetes mellitus with hyperglycemia: Secondary | ICD-10-CM

## 2016-10-15 DIAGNOSIS — E1129 Type 2 diabetes mellitus with other diabetic kidney complication: Secondary | ICD-10-CM

## 2016-10-15 LAB — COMPLETE METABOLIC PANEL WITH GFR
ALK PHOS: 97 U/L (ref 33–130)
ALT: 21 U/L (ref 6–29)
AST: 15 U/L (ref 10–35)
Albumin: 4.1 g/dL (ref 3.6–5.1)
BILIRUBIN TOTAL: 0.4 mg/dL (ref 0.2–1.2)
BUN: 15 mg/dL (ref 7–25)
CALCIUM: 9.7 mg/dL (ref 8.6–10.4)
CO2: 26 mmol/L (ref 20–31)
CREATININE: 0.97 mg/dL (ref 0.50–1.05)
Chloride: 103 mmol/L (ref 98–110)
GFR, EST AFRICAN AMERICAN: 75 mL/min (ref >=60)
GFR, EST NON AFRICAN AMERICAN: 65 mL/min (ref >=60)
Glucose, Bld: 239 mg/dL — ABNORMAL HIGH (ref 65–99)
Potassium: 4.5 mmol/L (ref 3.5–5.3)
Sodium: 137 mmol/L (ref 135–146)
TOTAL PROTEIN: 7.6 g/dL (ref 6.1–8.1)

## 2016-10-15 LAB — LIPID PANEL
CHOLESTEROL: 158 mg/dL (ref <200)
HDL: 25 mg/dL — AB (ref >50)
LDL Cholesterol: 96 mg/dL
TRIGLYCERIDES: 185 mg/dL — AB (ref <150)
Total CHOL/HDL Ratio: 6.3 Ratio — ABNORMAL HIGH (ref <5.0)
VLDL: 37 mg/dL — AB (ref <30)

## 2016-10-15 LAB — POCT GLYCOSYLATED HEMOGLOBIN (HGB A1C): Hemoglobin A1C: 10.6

## 2016-10-15 MED ORDER — METFORMIN HCL ER 750 MG PO TB24: 1500.0000 mg | ORAL_TABLET | Freq: Every day | ORAL | 2 refills | Status: DC

## 2016-10-15 MED ORDER — LOSARTAN POTASSIUM-HCTZ 50-12.5 MG PO TABS: 1.0000 | ORAL_TABLET | Freq: Every day | ORAL | 2 refills | Status: DC

## 2016-10-15 MED ORDER — INSULIN DEGLUDEC-LIRAGLUTIDE 100-3.6 UNIT-MG/ML ~~LOC~~ SOPN: 16.0000 [IU] | PEN_INJECTOR | Freq: Every day | SUBCUTANEOUS | 2 refills | Status: DC

## 2016-10-15 MED ORDER — GABAPENTIN 300 MG PO CAPS: 600.0000 mg | ORAL_CAPSULE | Freq: Two times a day (BID) | ORAL | 2 refills | Status: DC

## 2016-10-15 NOTE — Progress Notes (Signed)
Name: Janice Short   MRN: [id number]    DOB: [birth year]   Date:10/15/2016       Progress Note  Subjective  Chief Complaint  Chief Complaint  Patient presents with  . Diabetes    pt broght her machine in today to get instructions on how to use it has not been checking due to not knowing how to use her machine  . Hypertension  . Hyperlipidemia  . Pain    right side back shoulder area  . Medication Refill  . Flu Vaccine    HPI  DMII with nephropathy and also neuropathy: she is currently on Tresiba and Janumet XR, but she states that she is taking one or the other medication.  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 HgbA1C is not at goal, but has improved from 13.9 to 10.6. She stopped sodas, and is trying to make better choices with her diet. She denies polyphagia, but has polydipsia and polyuria. No blurred vision. She trying checking her glucose at home but was not doing it correctly, she was instructed in the office today.  She is on Gabapentin 600 mg twice daily and pain has been under better control. ARB for proteinuria   HTN: she is compliant with medication and bp is at goal. She denies chest pain or palpitation. She denies orthostatic changes and does not want to change medication at this time.   Hyperlipidemia: she is back on Lipitor and we will recheck labs.   Joint deformity and joint pain: her sister has RA, she was seen by Rheumatologist, and Ortho , had MRI lumbar spine and has spinal stenosis, seen by neurosurgeon but she afraid of having surgery at this time  Obesity: she has gained one pound, still drinking sodas and eating fast food. Discussed life style modification    Patient Active Problem List   Diagnosis Date Noted  . Spinal stenosis 10/15/2016  . Diabetic polyneuropathy associated with type 2 diabetes mellitus ([place]) 07/14/2016  . Chronic midline low back pain with sciatica 06/22/2016  . Dyslipidemia 06/22/2016  . Primary  osteoarthritis of both knees 06/22/2016  . Carpal tunnel syndrome 07/01/2015  . Cervical radiculitis 07/01/2015  . Corn or callus 07/01/2015  . Impingement syndrome of shoulder 07/01/2015  . Microalbuminuria 07/01/2015  . Morbid obesity ([place]) 07/01/2015  . Supraventricular tachycardia ([place]) 07/01/2015  . Compulsive tobacco user syndrome 07/01/2015  . Tenosynovitis of wrist 07/01/2015  . Benign hypertension 11/05/2009  . Uncontrolled type 2 diabetes mellitus with microalbuminuria ([place]) 11/05/2009  . Athlete's foot 08/05/2009  . Combined fat and carbohydrate induced hyperlipemia 01/08/2009  . Vitamin D deficiency 01/08/2009  . Asthma, mild intermittent 12/28/2008  . Allergic rhinitis 12/28/2008  . Lumbosacral neuritis 08/11/2007    Past Surgical History:  Procedure Laterality Date  . ABDOMINAL HYSTERECTOMY      Family History  Problem Relation Age of Onset  . Heart attack Mother   . CAD Mother   . Kidney disease Father     Social History   Social History  . Marital status: Single    Spouse name: N/A  . Number of children: N/A  . Years of education: N/A   Occupational History  . Not on file.   Social History Main Topics  . Smoking status: Current Some Day Smoker  . Smokeless tobacco: Never Used  . Alcohol use No  . Drug use: No  . Sexual activity: Yes   Other Topics Concern  .  Not on file   Social History Narrative  . No narrative on file     Current Outpatient Prescriptions:  .  acetaminophen (TYLENOL) 500 MG tablet, Take 1 tablet by mouth at bedtime as needed., Disp: , Rfl:  .  aspirin 81 MG tablet, Take 1 tablet by mouth daily., Disp: , Rfl:  .  atorvastatin (LIPITOR) 40 MG tablet, Take 1 tablet (40 mg total) by mouth at bedtime., Disp: 30 tablet, Rfl: 5 .  BAYER MICROLET LANCETS lancets, Use as instructed, Disp: 100 each, Rfl: 12 .  diclofenac sodium (VOLTAREN) 1 % GEL, Apply 4 g topically 4 (four) times daily., Disp: 100 g, Rfl: 5 .  fluticasone  (FLONASE) 50 MCG/ACT nasal spray, Place 2 sprays into the nose at bedtime., Disp: , Rfl:  .  gabapentin (NEURONTIN) 300 MG capsule, Take 2 capsules (600 mg total) by mouth 2 (two) times daily., Disp: 120 capsule, Rfl: 2 .  glucose blood (BAYER CONTOUR TEST) test strip, Use as instructed, Disp: 100 each, Rfl: 12 .  Insulin Degludec-Liraglutide (XULTOPHY) 100-3.6 UNIT-MG/ML SOPN, Inject 16-50 Units into the skin daily., Disp: 9 mL, Rfl: 2 .  losartan-hydrochlorothiazide (HYZAAR) 50-12.5 MG tablet, Take 1 tablet by mouth daily., Disp: 30 tablet, Rfl: 2 .  metFORMIN (GLUCOPHAGE XR) 750 MG 24 hr tablet, Take 2 tablets (1,500 mg total) by mouth daily with breakfast., Disp: 60 tablet, Rfl: 2  Allergies  Allergen Reactions  . Other Shortness Of Breath    Cat dander and grass pollen     ROS  Constitutional: Negative for fever or weight change.  Respiratory: Negative for cough and shortness of breath.   Cardiovascular: Negative for chest pain or palpitations.  Gastrointestinal: Negative for abdominal pain, no bowel changes.  Musculoskeletal: Postivefor gait problem or joint swelling.  Skin: Negative for rash.  Neurological: Negative for dizziness or headache.  No other specific complaints in a complete review of systems (except as listed in HPI above).  Objective  Vitals:   10/15/16 0939  BP: 110/68  Pulse: 94  Resp: 16  Temp: 97.8 F (36.6 C)  TempSrc: Oral  SpO2: 98%  Weight: 211 lb 7 oz (95.9 kg)    Body mass index is 34.13 kg/m.  Physical Exam  Constitutional: Patient appears well-developed and well-nourished. Obese  No distress.  HEENT: head atraumatic, normocephalic, pupils equal and reactive to light,neck supple, throat within normal limits Cardiovascular: Normal rate, regular rhythm and normal heart sounds.  No murmur heard. No BLE edema. Pulmonary/Chest: Effort normal and breath sounds normal. No respiratory distress. Abdominal: Soft.  There is no  tenderness. Psychiatric: Patient has a normal mood and affect. behavior is normal. Judgment and thought content normal. Muscular Skeletal: deformities both hands from OA, pain and mild effusion with extension of both knees. Pain during palpation of lumbar spine   Recent Results (from the past 2160 hour(s))  TB Skin Test     Status: Normal   Collection Time: 09/11/16  9:25 AM  Result Value Ref Range   TB Skin Test Negative    Induration 0 mm  POCT HgB A1C     Status: None   Collection Time: 10/15/16  9:49 AM  Result Value Ref Range   Hemoglobin A1C 10.6      PHQ2/9: Depression screen [facility] 2/9 10/15/2016 07/14/2016 11/19/2015  Decreased Interest 0 0 0  Down, Depressed, Hopeless 0 1 0  PHQ - 2 Score 0 1 0     Fall Risk: Fall Risk  10/15/2016 07/14/2016 11/19/2015  Falls in the past year? No Yes Yes  Number falls in past yr: - 1 1  Injury with Fall? - Yes Yes  Follow up - Falls evaluation completed -     Functional Status Survey: Is the patient deaf or have difficulty hearing?: No Does the patient have difficulty seeing, even when wearing glasses/contacts?: No Does the patient have difficulty concentrating, remembering, or making decisions?: No Does the patient have difficulty walking or climbing stairs?: Yes Does the patient have difficulty dressing or bathing?: No Does the patient have difficulty doing errands alone such as visiting a doctor's office or shopping?: No   Assessment & Plan  1. Uncontrolled type 2 diabetes mellitus with microalbuminuria, without long-term current use of insulin (HCC)  Doing better, no family history of thyroid cancer or personal history of pancreatitis - POCT HgB A1C - COMPLETE METABOLIC PANEL WITH GFR - Insulin Degludec-Liraglutide (XULTOPHY) 100-3.6 UNIT-MG/ML SOPN; Inject 16-50 Units into the skin daily.  Dispense: 9 mL; Refill: 2 - metFORMIN (GLUCOPHAGE XR) 750 MG 24 hr tablet; Take 2 tablets (1,500 mg total) by mouth daily with breakfast.   Dispense: 60 tablet; Refill: 2  2. Need for influenza vaccination  - Flu Vaccine QUAD 36+ mos PF IM (Fluarix & Fluzone Quad PF)  3. Benign hypertension  - COMPLETE METABOLIC PANEL WITH GFR - losartan-hydrochlorothiazide (HYZAAR) 50-12.5 MG tablet; Take 1 tablet by mouth daily.  Dispense: 30 tablet; Refill: 2  4. Dyslipidemia  - Lipid panel  5. Diabetic polyneuropathy associated with type 2 diabetes mellitus (HCC)  - gabapentin (NEURONTIN) 300 MG capsule; Take 2 capsules (600 mg total) by mouth 2 (two) times daily.  Dispense: 120 capsule; Refill: 2  6. Morbid obesity, unspecified obesity type [facility])  Discussed with the patient the risk posed by an increased BMI. Discussed importance of portion control, calorie counting and at least 150 minutes of physical activity weekly. Avoid sweet beverages and drink more water. Eat at least 6 servings of fruit and vegetables daily   7. Chronic midline low back pain with sciatica, sciatica laterality unspecified  - gabapentin (NEURONTIN) 300 MG capsule; Take 2 capsules (600 mg total) by mouth 2 (two) times daily.  Dispense: 120 capsule; Refill: 2  9. Spinal stenosis of lumbosacral region  - gabapentin (NEURONTIN) 300 MG capsule; Take 2 capsules (600 mg total) by mouth 2 (two) times daily.  Dispense: 120 capsule; Refill: 2

## 2016-11-02 ENCOUNTER — Telehealth: Payer: Self-pay

## 2016-11-02 NOTE — Telephone Encounter (Signed)
Patient states since starting Xultophy she has been bruising from the needle injection. Patient has continuously tried to inject in different spots on her stomach, thigh and back of her arm. Patient states she is having pain with the injections and wants to know what to do. She even tried some of her old needles that have not been opened thinking it was just the new ones. But it is giving her the same side effect of bruising, pain and bubbles on her injection spot.

## 2016-11-03 ENCOUNTER — Other Ambulatory Visit: Payer: Self-pay | Admitting: Family Medicine

## 2016-11-03 MED ORDER — LIRAGLUTIDE 18 MG/3ML ~~LOC~~ SOPN: 1.2000 mg | PEN_INJECTOR | Freq: Every day | SUBCUTANEOUS | 2 refills | Status: DC

## 2016-11-03 MED ORDER — INSULIN DETEMIR 100 UNIT/ML FLEXPEN: 30.0000 [IU] | PEN_INJECTOR | Freq: Every day | SUBCUTANEOUS | 0 refills | Status: DC

## 2016-11-03 NOTE — Telephone Encounter (Signed)
Changed to Victoza and Levemir as requested

## 2016-11-03 NOTE — Telephone Encounter (Signed)
I can change to a long acting insulin and Victoza , but it will be two shots daily . Otherwise she will need follow up to change to another medication

## 2016-11-06 ENCOUNTER — Telehealth: Payer: Self-pay | Admitting: Family Medicine

## 2016-11-06 NOTE — Telephone Encounter (Signed)
Patient requesting return call. She is confused with her dm medications

## 2016-11-13 NOTE — Telephone Encounter (Signed)
Patient is also needing a refill on her insulin needles.  Patient stated to please make sure it is the thinner needles so that it is less painful.  Patient uses the [facility] on [address].

## 2016-11-16 ENCOUNTER — Other Ambulatory Visit: Payer: Self-pay | Admitting: Family Medicine

## 2016-11-16 MED ORDER — INSULIN PEN NEEDLE 32G X 6 MM MISC: 1.0000 | Freq: Every day | 1 refills | Status: DC

## 2016-11-16 NOTE — Telephone Encounter (Signed)
done

## 2016-11-17 ENCOUNTER — Ambulatory Visit: Payer: BLUE CROSS/BLUE SHIELD | Admitting: Family Medicine

## 2016-12-03 ENCOUNTER — Emergency Department: Payer: BLUE CROSS/BLUE SHIELD

## 2016-12-03 ENCOUNTER — Emergency Department
Admission: EM | Admit: 2016-12-03 | Discharge: 2016-12-03 | Disposition: A | Discharge status: Home / Self Care | Payer: BLUE CROSS/BLUE SHIELD | Attending: Emergency Medicine | Admitting: Emergency Medicine

## 2016-12-03 DIAGNOSIS — Z794 Long term (current) use of insulin: Secondary | ICD-10-CM | POA: Diagnosis not present

## 2016-12-03 DIAGNOSIS — M1712 Unilateral primary osteoarthritis, left knee: Secondary | ICD-10-CM | POA: Diagnosis not present

## 2016-12-03 DIAGNOSIS — Y999 Unspecified external cause status: Secondary | ICD-10-CM | POA: Diagnosis not present

## 2016-12-03 DIAGNOSIS — E119 Type 2 diabetes mellitus without complications: Secondary | ICD-10-CM | POA: Insufficient documentation

## 2016-12-03 DIAGNOSIS — F17200 Nicotine dependence, unspecified, uncomplicated: Secondary | ICD-10-CM | POA: Diagnosis not present

## 2016-12-03 DIAGNOSIS — Z7982 Long term (current) use of aspirin: Secondary | ICD-10-CM | POA: Insufficient documentation

## 2016-12-03 DIAGNOSIS — J4520 Mild intermittent asthma, uncomplicated: Secondary | ICD-10-CM | POA: Insufficient documentation

## 2016-12-03 DIAGNOSIS — Y929 Unspecified place or not applicable: Secondary | ICD-10-CM | POA: Insufficient documentation

## 2016-12-03 DIAGNOSIS — Z79899 Other long term (current) drug therapy: Secondary | ICD-10-CM | POA: Insufficient documentation

## 2016-12-03 DIAGNOSIS — S8992XA Unspecified injury of left lower leg, initial encounter: Secondary | ICD-10-CM | POA: Diagnosis present

## 2016-12-03 DIAGNOSIS — Y9389 Activity, other specified: Secondary | ICD-10-CM | POA: Insufficient documentation

## 2016-12-03 DIAGNOSIS — I10 Essential (primary) hypertension: Secondary | ICD-10-CM | POA: Insufficient documentation

## 2016-12-03 DIAGNOSIS — W101XXA Fall (on)(from) sidewalk curb, initial encounter: Secondary | ICD-10-CM | POA: Insufficient documentation

## 2016-12-03 DIAGNOSIS — M25562 Pain in left knee: Secondary | ICD-10-CM

## 2016-12-03 MED ORDER — HYDROCODONE-ACETAMINOPHEN 5-325 MG PO TABS: 1.0000 | ORAL_TABLET | Freq: Two times a day (BID) | ORAL | 0 refills | Status: DC | PRN

## 2016-12-03 MED ORDER — DICLOFENAC SODIUM 75 MG PO TBEC: 75.0000 mg | DELAYED_RELEASE_TABLET | Freq: Two times a day (BID) | ORAL | 1 refills | Status: DC

## 2016-12-03 MED ORDER — HYDROCODONE-ACETAMINOPHEN 5-325 MG PO TABS
1.0000 | ORAL_TABLET | Freq: Once | ORAL | Status: AC
  Administered 2016-12-03: 1 via ORAL
  Filled 2016-12-03: qty 1

## 2016-12-03 NOTE — ED Triage Notes (Signed)
Pt states she fell yesterday and injured her left knee

## 2016-12-03 NOTE — Discharge Instructions (Signed)
Your exam is negative for a fracture. You have severe arthritis and bone spurs. You should take the Voltaren (diclofenac) daily for arthritis pain relief. Take the pain medicine, as needed for more severe pain. Follow-up with Dr. Ancil Boozer to keep you on the arthritis medicine.

## 2016-12-03 NOTE — ED Provider Notes (Signed)
[facility] Emergency Department Provider Note ____________________________________________  Time seen: 1248  I have reviewed the triage vital signs and the nursing notes.  HISTORY  Chief Complaint  Knee Pain  HPI Janice Short is a 55 y.o. female presents to the ED for evaluation of pain the the left knee after a fallyesterday. She describes she fell while steppiing up onto the curb. Her foot slipped, causing her to fall landing on her left knee. She has had some swelling and stiffness since that time. She gives a history of arthritis to multiple joints. She take Tylenol and Gabapentin for her arthritis. She denies any other injury related to the fall. She has been using her single-point cane to ambulate today.   Past Medical History:  Diagnosis Date  . Allergy   . Asthma   . Carpal tunnel syndrome on left   . Cervical radiculitis   . Chronic osteoarthritis   . Corns and callosity   . Dermatophytosis of foot   . Diabetes mellitus without complication ([place])   . Gout   . Hyperlipidemia   . Hypertension   . Impingement syndrome of left shoulder   . Lumbosacral neuritis   . Microalbuminuria   . Obesity   . SVT (supraventricular tachycardia) ([place])   . Tenosynovitis of wrist   . Vitamin D deficiency     Patient Active Problem List   Diagnosis Date Noted  . Spinal stenosis 10/15/2016  . Diabetic polyneuropathy associated with type 2 diabetes mellitus ([place]) 07/14/2016  . Chronic midline low back pain with sciatica 06/22/2016  . Dyslipidemia 06/22/2016  . Primary osteoarthritis of both knees 06/22/2016  . Carpal tunnel syndrome 07/01/2015  . Cervical radiculitis 07/01/2015  . Corn or callus 07/01/2015  . Impingement syndrome of shoulder 07/01/2015  . Microalbuminuria 07/01/2015  . Morbid obesity ([place]) 07/01/2015  . Supraventricular tachycardia ([place]) 07/01/2015  . Compulsive tobacco user syndrome 07/01/2015  . Tenosynovitis of wrist 07/01/2015  .  Benign hypertension 11/05/2009  . Uncontrolled type 2 diabetes mellitus with microalbuminuria ([place]) 11/05/2009  . Athlete's foot 08/05/2009  . Combined fat and carbohydrate induced hyperlipemia 01/08/2009  . Vitamin D deficiency 01/08/2009  . Asthma, mild intermittent 12/28/2008  . Allergic rhinitis 12/28/2008  . Lumbosacral neuritis 08/11/2007    Past Surgical History:  Procedure Laterality Date  . ABDOMINAL HYSTERECTOMY      Prior to Admission medications   Medication Sig Start Date End Date Taking? Authorizing Provider  acetaminophen (TYLENOL) 500 MG tablet Take 1 tablet by mouth at bedtime as needed.    Historical Provider, MD  aspirin 81 MG tablet Take 1 tablet by mouth daily. 11/05/09   Historical Provider, MD  atorvastatin (LIPITOR) 40 MG tablet Take 1 tablet (40 mg total) by mouth at bedtime. 07/14/16   Steele Sizer, MD  BAYER MICROLET LANCETS lancets Use as instructed 07/21/16   Steele Sizer, MD  diclofenac (VOLTAREN) 75 MG EC tablet Take 1 tablet (75 mg total) by mouth 2 (two) times daily. 12/03/16   Joshuwa Vecchio V Bacon Sarvesh Meddaugh, PA-C  diclofenac sodium (VOLTAREN) 1 % GEL Apply 4 g topically 4 (four) times daily. 07/14/16   Steele Sizer, MD  fluticasone (FLONASE) 50 MCG/ACT nasal spray Place 2 sprays into the nose at bedtime. 03/06/15   Historical Provider, MD  gabapentin (NEURONTIN) 300 MG capsule Take 2 capsules (600 mg total) by mouth 2 (two) times daily. 10/15/16   Steele Sizer, MD  glucose blood (BAYER CONTOUR TEST) test strip Use as  instructed 07/29/16   Steele Sizer, MD  HYDROcodone-acetaminophen (NORCO) 5-325 MG tablet Take 1 tablet by mouth 2 (two) times daily as needed. 12/03/16   Reta Norgren V Bacon Marlan Steward, PA-C  Insulin Detemir (LEVEMIR FLEXTOUCH) 100 UNIT/ML Pen Inject 30-50 Units into the skin daily at 10 pm. 11/03/16   Steele Sizer, MD  Insulin Pen Needle (NOVOFINE) 32G X 6 MM MISC 1 each by Does not apply route daily. 11/16/16   Steele Sizer, MD  liraglutide  (VICTOZA) 18 MG/3ML SOPN Inject 0.2-0.3 mLs (1.2-1.8 mg total) into the skin daily. 11/03/16   Steele Sizer, MD  losartan-hydrochlorothiazide (HYZAAR) 50-12.5 MG tablet Take 1 tablet by mouth daily. 10/15/16   Steele Sizer, MD  metFORMIN (GLUCOPHAGE XR) 750 MG 24 hr tablet Take 2 tablets (1,500 mg total) by mouth daily with breakfast. 10/15/16   Steele Sizer, MD    Allergies Other  Family History  Problem Relation Age of Onset  . Heart attack Mother   . CAD Mother   . Kidney disease Father     Social History Social History  Substance Use Topics  . Smoking status: Current Some Day Smoker  . Smokeless tobacco: Never Used  . Alcohol use No    Review of Systems  Constitutional: Negative for fever. Cardiovascular: Negative for chest pain. Respiratory: Negative for shortness of breath. Gastrointestinal: Negative for abdominal pain, vomiting and diarrhea. Musculoskeletal: Negative for back pain. Left knee pain  Skin: Negative for rash. Neurological: Negative for headaches, focal weakness or numbness. ____________________________________________  PHYSICAL EXAM:  VITAL SIGNS: ED Triage Vitals [12/03/16 1220]  Enc Vitals Group     BP 134/77     Pulse Rate 98     Resp 18     Temp 97.7 F (36.5 C)     Temp Source Oral     SpO2 98 %     Weight 210 lb (95.3 kg)     Height '5\' 7"'$  (1.702 m)     Head Circumference      Peak Flow      Pain Score 10     Pain Loc      Pain Edu?      Excl. in [place]?    Constitutional: Alert and oriented. Well appearing and in no distress. Head: Normocephalic and atraumatic. Cardiovascular:  Normal distal pulses. Respiratory: Normal respiratory effort.  Musculoskeletal: Left knee with obvious chronic changes likely representing underlying osteoarthritis. Patient does have a small joint effusion laterally. She is normal range of motion to the knee without laxity. She is also noted to have some fullness in the popliteal space. No Or Achilles  tenderness is noted. Ankle exam is normal. Nontender with normal range of motion in all extremities.  Neurologic: Normal speech and language. No gross focal neurologic deficits are appreciated. Skin:  Skin is warm, dry and intact. No rash noted. ____________________________________________   RADIOLOGY  Left Knee  IMPRESSION: There is no fracture or dislocation. There is generalized osteoarthritic change with joint effusion. Chondrocalcinosis is noted, a finding that may be seen with osteoarthritis but also may be seen with calcium pyrophosphate deposition disease. Calcifications posterior the joint may reside within a Baker cyst. Ultrasound or MR would be the imaging study of choice to assess for potential Baker cyst.  I, Len Azeez, Dannielle Karvonen, personally viewed and evaluated these images (plain radiographs) as part of my medical decision making, as well as reviewing the written report by the radiologist. ____________________________________________  PROCEDURES  Norco 5-325 mg PO ____________________________________________  INITIAL IMPRESSION / ASSESSMENT AND PLAN / ED COURSE  Patient with an acute strain of the left knee status post mechanical fall. She has significant underlying osteoarthritis and bone spurs. She is discharged with a prescription for Voltaren to dose daily for arthritis pain relief. She is also provided with #10 hydrocodone for more severe pain relief related to the fall. She is advised to follow with her primary care provider for ongoing management of her chronic osteoarthritis.  Clinical Course    ____________________________________________  FINAL CLINICAL IMPRESSION(S) / ED DIAGNOSES  Final diagnoses:  Acute pain of left knee  Primary osteoarthritis of left knee      Melvenia Needles, PA-C 12/03/16 [place], MD 12/04/16 2049

## 2016-12-03 NOTE — ED Notes (Signed)
See triage note   States she fell yesterday  States her left knee gave out and landed on left side  Hit her left shoulder and knee  Unable to bear full wt on knee  Min swelling noted

## 2016-12-08 ENCOUNTER — Ambulatory Visit: Payer: BLUE CROSS/BLUE SHIELD | Admitting: Pain Medicine

## 2016-12-10 ENCOUNTER — Ambulatory Visit: Payer: BLUE CROSS/BLUE SHIELD | Admitting: Pain Medicine

## 2017-01-28 ENCOUNTER — Encounter: Payer: Self-pay | Admitting: Family Medicine

## 2017-01-28 ENCOUNTER — Ambulatory Visit (INDEPENDENT_AMBULATORY_CARE_PROVIDER_SITE_OTHER): Payer: BLUE CROSS/BLUE SHIELD | Admitting: Family Medicine

## 2017-01-28 VITALS — BP 110/70 | HR 120 | Temp 98.6°F | Resp 16 | Ht 67.0 in | Wt 203.5 lb

## 2017-01-28 DIAGNOSIS — J302 Other seasonal allergic rhinitis: Secondary | ICD-10-CM

## 2017-01-28 DIAGNOSIS — J3089 Other allergic rhinitis: Secondary | ICD-10-CM | POA: Diagnosis not present

## 2017-01-28 DIAGNOSIS — R809 Proteinuria, unspecified: Secondary | ICD-10-CM | POA: Diagnosis not present

## 2017-01-28 DIAGNOSIS — IMO0002 Reserved for concepts with insufficient information to code with codable children: Secondary | ICD-10-CM

## 2017-01-28 DIAGNOSIS — E1129 Type 2 diabetes mellitus with other diabetic kidney complication: Secondary | ICD-10-CM

## 2017-01-28 DIAGNOSIS — E1169 Type 2 diabetes mellitus with other specified complication: Secondary | ICD-10-CM | POA: Diagnosis not present

## 2017-01-28 DIAGNOSIS — M4807 Spinal stenosis, lumbosacral region: Secondary | ICD-10-CM

## 2017-01-28 DIAGNOSIS — M170 Bilateral primary osteoarthritis of knee: Secondary | ICD-10-CM | POA: Diagnosis not present

## 2017-01-28 DIAGNOSIS — E1142 Type 2 diabetes mellitus with diabetic polyneuropathy: Secondary | ICD-10-CM

## 2017-01-28 DIAGNOSIS — J4520 Mild intermittent asthma, uncomplicated: Secondary | ICD-10-CM

## 2017-01-28 DIAGNOSIS — G8929 Other chronic pain: Secondary | ICD-10-CM

## 2017-01-28 DIAGNOSIS — M5440 Lumbago with sciatica, unspecified side: Secondary | ICD-10-CM | POA: Diagnosis not present

## 2017-01-28 DIAGNOSIS — E785 Hyperlipidemia, unspecified: Secondary | ICD-10-CM

## 2017-01-28 DIAGNOSIS — I10 Essential (primary) hypertension: Secondary | ICD-10-CM

## 2017-01-28 DIAGNOSIS — E1165 Type 2 diabetes mellitus with hyperglycemia: Secondary | ICD-10-CM | POA: Diagnosis not present

## 2017-01-28 LAB — POCT GLYCOSYLATED HEMOGLOBIN (HGB A1C): HEMOGLOBIN A1C: 11.1

## 2017-01-28 MED ORDER — GABAPENTIN 300 MG PO CAPS: 600.0000 mg | ORAL_CAPSULE | Freq: Two times a day (BID) | ORAL | 2 refills | Status: DC

## 2017-01-28 MED ORDER — ATORVASTATIN CALCIUM 40 MG PO TABS
40.0000 mg | ORAL_TABLET | Freq: Every day | ORAL | 5 refills | Status: DC
Start: 2017-01-28 — End: 2017-06-18

## 2017-01-28 MED ORDER — INSULIN DEGLUDEC-LIRAGLUTIDE 100-3.6 UNIT-MG/ML ~~LOC~~ SOPN: 50.0000 [IU] | PEN_INJECTOR | Freq: Every day | SUBCUTANEOUS | 2 refills | Status: DC

## 2017-01-28 MED ORDER — EMPAGLIFLOZIN-METFORMIN HCL ER 12.5-1000 MG PO TB24: 1.0000 | ORAL_TABLET | Freq: Every day | ORAL | 2 refills | Status: DC

## 2017-01-28 MED ORDER — ALBUTEROL SULFATE HFA 108 (90 BASE) MCG/ACT IN AERS: 2.0000 | INHALATION_SPRAY | Freq: Four times a day (QID) | RESPIRATORY_TRACT | 0 refills | Status: DC | PRN

## 2017-01-28 MED ORDER — FLUTICASONE PROPIONATE 50 MCG/ACT NA SUSP: 2.0000 | Freq: Every day | NASAL | 2 refills | Status: DC

## 2017-01-28 MED ORDER — LOSARTAN POTASSIUM-HCTZ 50-12.5 MG PO TABS: 1.0000 | ORAL_TABLET | Freq: Every day | ORAL | 2 refills | Status: DC

## 2017-01-28 MED ORDER — DICLOFENAC SODIUM 1 % TD GEL: 4.0000 g | Freq: Four times a day (QID) | TRANSDERMAL | 5 refills | Status: DC

## 2017-01-28 NOTE — Progress Notes (Signed)
Name: Janice Short   MRN: [id number]    DOB: 1961/09/05   Date:01/28/2017       Progress Note  Subjective  Chief Complaint  Chief Complaint  Patient presents with  . Diabetes  . Hypertension  . Hyperlipidemia  . Medication Refill    HPI  DMII with nephropathy and also neuropathy: she is currently on Levemir, metformin and Victoza, she forgets to take Levmir at night, her glucose is below 200 in am's but not checking it post-prandially. She still drinks Sprite and juices daily.   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 HgbA1C is not at goal,  13.9 to 10.6 and now up 11.1   She denies polyphagia, but has polydipsia and polyuria. No blurred vision.   She is on Gabapentin 600 mg twice daily and pain has been under better control. ARB for proteinuria   HTN: she is compliant with medication and bp is at goal. She denies chest pain or palpitation.   Hyperlipidemia: she is back on Lipitor, she needs to eat fish and tree nuts more often.   Joint deformity and joint pain: her sister has RA, she was seen by Rheumatologist, and Ortho , had MRI lumbar spine and has spinal stenosis, seen by neurosurgeon but she afraid of having surgery at this time, she is on Gabapentin  Obesity: she has lost weight since last visit, she is  drinking sodas and eating fast food. Discussed life style modification   Asthma Mild Intermittent: she has intermittent symptoms during Spring and fall and needs refill of Inhaler  Patient Active Problem List   Diagnosis Date Noted  . Spinal stenosis 10/15/2016  . Diabetic polyneuropathy associated with type 2 diabetes mellitus ([place]) 07/14/2016  . Chronic midline low back pain with sciatica 06/22/2016  . Dyslipidemia 06/22/2016  . Primary osteoarthritis of both knees 06/22/2016  . Carpal tunnel syndrome 07/01/2015  . Cervical radiculitis 07/01/2015  . Corn or callus 07/01/2015  . Impingement syndrome of shoulder 07/01/2015  .  Microalbuminuria 07/01/2015  . Morbid obesity ([place]) 07/01/2015  . Supraventricular tachycardia ([place]) 07/01/2015  . Compulsive tobacco user syndrome 07/01/2015  . Tenosynovitis of wrist 07/01/2015  . Benign hypertension 11/05/2009  . Uncontrolled type 2 diabetes mellitus with microalbuminuria ([place]) 11/05/2009  . Athlete's foot 08/05/2009  . Combined fat and carbohydrate induced hyperlipemia 01/08/2009  . Vitamin D deficiency 01/08/2009  . Asthma, mild intermittent 12/28/2008  . Allergic rhinitis 12/28/2008  . Lumbosacral neuritis 08/11/2007    Past Surgical History:  Procedure Laterality Date  . ABDOMINAL HYSTERECTOMY      Family History  Problem Relation Age of Onset  . Heart attack Mother   . CAD Mother   . Kidney disease Father     Social History   Social History  . Marital status: Single    Spouse name: N/A  . Number of children: N/A  . Years of education: N/A   Occupational History  . Not on file.   Social History Main Topics  . Smoking status: Current Some Day Smoker  . Smokeless tobacco: Never Used  . Alcohol use No  . Drug use: No  . Sexual activity: Yes   Other Topics Concern  . Not on file   Social History Narrative  . No narrative on file     Current Outpatient Prescriptions:  .  acetaminophen (TYLENOL) 500 MG tablet, Take 1 tablet by mouth at bedtime as needed., Disp: , Rfl:  .  albuterol (PROVENTIL HFA;VENTOLIN HFA) 108 (90 Base) MCG/ACT inhaler, Inhale 2 puffs into the lungs every 6 (six) hours as needed for wheezing or shortness of breath., Disp: 1 Inhaler, Rfl: 0 .  aspirin 81 MG tablet, Take 1 tablet by mouth daily., Disp: , Rfl:  .  atorvastatin (LIPITOR) 40 MG tablet, Take 1 tablet (40 mg total) by mouth at bedtime., Disp: 30 tablet, Rfl: 5 .  BAYER MICROLET LANCETS lancets, Use as instructed, Disp: 100 each, Rfl: 12 .  diclofenac (VOLTAREN) 75 MG EC tablet, Take 1 tablet (75 mg total) by mouth 2 (two) times daily., Disp: 30 tablet, Rfl:  1 .  diclofenac sodium (VOLTAREN) 1 % GEL, Apply 4 g topically 4 (four) times daily., Disp: 100 g, Rfl: 5 .  Empagliflozin-Metformin HCl ER (SYNJARDY XR) 12.04-999 MG TB24, Take 1 tablet by mouth daily., Disp: 30 tablet, Rfl: 2 .  fluticasone (FLONASE) 50 MCG/ACT nasal spray, Place 2 sprays into both nostrils at bedtime., Disp: 16 g, Rfl: 2 .  gabapentin (NEURONTIN) 300 MG capsule, Take 2 capsules (600 mg total) by mouth 2 (two) times daily., Disp: 120 capsule, Rfl: 2 .  glucose blood (BAYER CONTOUR TEST) test strip, Use as instructed, Disp: 100 each, Rfl: 12 .  Insulin Degludec-Liraglutide (XULTOPHY) 100-3.6 UNIT-MG/ML SOPN, Inject 50 Units into the skin daily., Disp: 9 mL, Rfl: 2 .  Insulin Pen Needle (NOVOFINE) 32G X 6 MM MISC, 1 each by Does not apply route daily., Disp: 100 each, Rfl: 1 .  losartan-hydrochlorothiazide (HYZAAR) 50-12.5 MG tablet, Take 1 tablet by mouth daily., Disp: 30 tablet, Rfl: 2  Allergies  Allergen Reactions  . Other Shortness Of Breath    Cat dander and grass pollen     ROS  Constitutional: Negative for fever , positive for  weight change.  Respiratory: Negative for cough and shortness of breath.   Cardiovascular: Negative for chest pain or palpitations.  Gastrointestinal: Negative for abdominal pain, no bowel changes.  Musculoskeletal: Positive for gait problem and  joint swelling.  Skin: Negative for rash.  Neurological: Negative for dizziness or headache.  No other specific complaints in a complete review of systems (except as listed in HPI above). Objective  Vitals:   01/28/17 1059  BP: 110/70  Pulse: (!) 120  Resp: 16  Temp: 98.6 F (37 C)  SpO2: 94%  Weight: 203 lb 8 oz (92.3 kg)  Height: '5\' 7"'$  (1.702 m)    Body mass index is 31.87 kg/m.  Physical Exam  Constitutional: Patient appears well-developed and well-nourished. Obese  No distress.  HEENT: head atraumatic, normocephalic, pupils equal and reactive to light,  neck supple, throat  within normal limits Cardiovascular: Normal rate, regular rhythm and normal heart sounds.  No murmur heard. No BLE edema. Pulmonary/Chest: Effort normal and breath sounds normal. No respiratory distress. Abdominal: Soft.  There is no tenderness. Psychiatric: Patient has a normal mood and affect. behavior is normal. Judgment and thought content normal. Muscular Skeletal: knee has crepitus with extension , wearing a brace, normal rom of lumbar spine, pain during palpation of lumbar spine    Recent Results (from the past 2160 hour(s))  POCT HgB A1C     Status: Abnormal   Collection Time: 01/28/17 11:12 AM  Result Value Ref Range   Hemoglobin A1C 11.1      PHQ2/9: Depression screen [facility] 2/9 10/15/2016 07/14/2016 11/19/2015  Decreased Interest 0 0 0  Down, Depressed, Hopeless 0 1 0  PHQ - 2 Score 0 1  0    Fall Risk: Fall Risk  10/15/2016 07/14/2016 11/19/2015  Falls in the past year? No Yes Yes  Number falls in past yr: - 1 1  Injury with Fall? - Yes Yes  Follow up - Falls evaluation completed -     Assessment & Plan  1. Uncontrolled type 2 diabetes mellitus with microalbuminuria, without long-term current use of insulin ([place])  She must stop having sweet beverages, and follow a diabetic diet, we will simplify her medication, currently forgets to use Levemir, and we will add Synjardy to help with glucose control, explained that it may cause yeast vaginitis.  - POCT HgB A1C - Insulin Degludec-Liraglutide (XULTOPHY) 100-3.6 UNIT-MG/ML SOPN; Inject 50 Units into the skin daily.  Dispense: 9 mL; Refill: 2 - Empagliflozin-Metformin HCl ER (SYNJARDY XR) 12.04-999 MG TB24; Take 1 tablet by mouth daily.  Dispense: 30 tablet; Refill: 2  2. Benign hypertension  - losartan-hydrochlorothiazide (HYZAAR) 50-12.5 MG tablet; Take 1 tablet by mouth daily.  Dispense: 30 tablet; Refill: 2  3. Dyslipidemia associated with type 2 diabetes mellitus ([place])  Discussed life style modification   4. Chronic  midline low back pain with sciatica, sciatica laterality unspecified  - gabapentin (NEURONTIN) 300 MG capsule; Take 2 capsules (600 mg total) by mouth 2 (two) times daily.  Dispense: 120 capsule; Refill: 2  5. Morbid obesity, unspecified obesity type [facility])  Discussed with the patient the risk posed by an increased BMI. Discussed importance of portion control, calorie counting and at least 150 minutes of physical activity weekly. Avoid sweet beverages and drink more water. Eat at least 6 servings of fruit and vegetables daily   6. Dyslipidemia  - atorvastatin (LIPITOR) 40 MG tablet; Take 1 tablet (40 mg total) by mouth at bedtime.  Dispense: 30 tablet; Refill: 5  7. Primary osteoarthritis of both knees  - diclofenac sodium (VOLTAREN) 1 % GEL; Apply 4 g topically 4 (four) times daily.  Dispense: 100 g; Refill: 5  8. Diabetic polyneuropathy associated with type 2 diabetes mellitus ([facility])  - gabapentin (NEURONTIN) 300 MG capsule; Take 2 capsules (600 mg total) by mouth 2 (two) times daily.  Dispense: 120 capsule; Refill: 2  9. Spinal stenosis of lumbosacral region  - gabapentin (NEURONTIN) 300 MG capsule; Take 2 capsules (600 mg total) by mouth 2 (two) times daily.  Dispense: 120 capsule; Refill: 2  10. Mild intermittent asthma without complication  - albuterol (PROVENTIL HFA;VENTOLIN HFA) 108 (90 Base) MCG/ACT inhaler; Inhale 2 puffs into the lungs every 6 (six) hours as needed for wheezing or shortness of breath.  Dispense: 1 Inhaler; Refill: 0  11. Perennial allergic rhinitis with seasonal variation  - fluticasone (FLONASE) 50 MCG/ACT nasal spray; Place 2 sprays into both nostrils at bedtime.  Dispense: 16 g; Refill: 2

## 2017-03-15 ENCOUNTER — Other Ambulatory Visit: Payer: Self-pay | Admitting: Family Medicine

## 2017-03-15 DIAGNOSIS — R809 Proteinuria, unspecified: Secondary | ICD-10-CM

## 2017-03-15 DIAGNOSIS — I10 Essential (primary) hypertension: Secondary | ICD-10-CM

## 2017-03-15 DIAGNOSIS — E1129 Type 2 diabetes mellitus with other diabetic kidney complication: Secondary | ICD-10-CM

## 2017-03-15 DIAGNOSIS — IMO0002 Reserved for concepts with insufficient information to code with codable children: Secondary | ICD-10-CM

## 2017-03-15 DIAGNOSIS — E1165 Type 2 diabetes mellitus with hyperglycemia: Secondary | ICD-10-CM

## 2017-03-15 MED ORDER — LOSARTAN POTASSIUM-HCTZ 50-12.5 MG PO TABS: 1.0000 | ORAL_TABLET | Freq: Every day | ORAL | 2 refills | Status: DC

## 2017-03-15 MED ORDER — EMPAGLIFLOZIN-METFORMIN HCL ER 12.5-1000 MG PO TB24
1.0000 | ORAL_TABLET | Freq: Every day | ORAL | 2 refills | Status: DC
Start: 2017-03-15 — End: 2017-03-17

## 2017-03-15 MED ORDER — INSULIN DEGLUDEC-LIRAGLUTIDE 100-3.6 UNIT-MG/ML ~~LOC~~ SOPN
50.0000 [IU] | PEN_INJECTOR | Freq: Every day | SUBCUTANEOUS | 2 refills | Status: DC
Start: 2017-03-15 — End: 2017-03-26

## 2017-03-15 NOTE — Telephone Encounter (Signed)
Pt is needing her Diab medication and her losartan refilled.  Pharm is [facility] on [address] .

## 2017-03-17 ENCOUNTER — Telehealth: Payer: Self-pay | Admitting: Family Medicine

## 2017-03-17 MED ORDER — EMPAGLIFLOZIN 10 MG PO TABS: 10.0000 mg | ORAL_TABLET | Freq: Every day | ORAL | 0 refills | Status: DC

## 2017-03-17 MED ORDER — METFORMIN HCL ER 500 MG PO TB24: 2000.0000 mg | ORAL_TABLET | Freq: Every day | ORAL | 0 refills | Status: DC

## 2017-03-17 MED ORDER — HYDROCHLOROTHIAZIDE 12.5 MG PO TABS: 12.5000 mg | ORAL_TABLET | Freq: Every day | ORAL | 0 refills | Status: DC

## 2017-03-17 MED ORDER — LOSARTAN POTASSIUM 50 MG PO TABS: 50.0000 mg | ORAL_TABLET | Freq: Every day | ORAL | 0 refills | Status: DC

## 2017-03-17 NOTE — Telephone Encounter (Signed)
I was informed by staff that patient was at pharmacy and that her Iva Boop and losartan/hctz were not covered by insurance; had to be switched New prescriptions sent covering for patient's primary

## 2017-03-19 ENCOUNTER — Other Ambulatory Visit: Payer: Self-pay

## 2017-03-19 DIAGNOSIS — E1142 Type 2 diabetes mellitus with diabetic polyneuropathy: Secondary | ICD-10-CM

## 2017-03-19 MED ORDER — DAPAGLIFLOZIN PROPANEDIOL 10 MG PO TABS: 10.0000 mg | ORAL_TABLET | Freq: Every day | ORAL | 2 refills | Status: DC

## 2017-03-19 MED ORDER — METFORMIN HCL ER 750 MG PO TB24: 1500.0000 mg | ORAL_TABLET | Freq: Every day | ORAL | 2 refills | Status: DC

## 2017-03-19 NOTE — Telephone Encounter (Signed)
Insurance would not cover Synjardy so new rx was sent in for [place] and Metformin XR

## 2017-03-26 ENCOUNTER — Other Ambulatory Visit: Payer: Self-pay | Admitting: Family Medicine

## 2017-03-26 ENCOUNTER — Other Ambulatory Visit: Payer: Self-pay

## 2017-03-26 ENCOUNTER — Encounter: Payer: Self-pay | Admitting: Family Medicine

## 2017-03-26 MED ORDER — DICLOFENAC SODIUM 1 % TD GEL: 4.0000 g | Freq: Four times a day (QID) | TRANSDERMAL | 2 refills | Status: DC

## 2017-03-26 MED ORDER — EXENATIDE ER 2 MG/0.85ML ~~LOC~~ AUIJ: 2.0000 mg | AUTO-INJECTOR | SUBCUTANEOUS | 0 refills | Status: DC

## 2017-03-26 MED ORDER — LOSARTAN POTASSIUM-HCTZ 50-12.5 MG PO TABS: 1.0000 | ORAL_TABLET | Freq: Every day | ORAL | 3 refills | Status: DC

## 2017-03-26 NOTE — Progress Notes (Unsigned)
Patient came in and states she can only do one shot weekly, she will continue Metformin and Jardiance, but wants to stop long acting insulin.  She will return in one month for follow up

## 2017-03-26 NOTE — Telephone Encounter (Signed)
Patient came in stating that the wrong medication was sent into her pharmacy.   The information was corrected (Lorsatan/ HCTZ)  Xultophy was denied due to patient's insurance say she must try and fail Byetta, Bydureon or Tanzeum first.  Patient was given samples of the Bydureon '2mg'$  (weekly) and an appt was made for 04/06/17 @ 9:40am.

## 2017-04-06 ENCOUNTER — Encounter: Payer: Self-pay | Admitting: Family Medicine

## 2017-04-06 ENCOUNTER — Ambulatory Visit: Payer: Medicaid Other | Admitting: Family Medicine

## 2017-04-06 ENCOUNTER — Ambulatory Visit (INDEPENDENT_AMBULATORY_CARE_PROVIDER_SITE_OTHER): Payer: Medicaid Other | Admitting: Family Medicine

## 2017-04-06 VITALS — BP 122/70 | HR 105 | Temp 97.3°F | Resp 16 | Ht 67.0 in | Wt 206.6 lb

## 2017-04-06 DIAGNOSIS — R809 Proteinuria, unspecified: Secondary | ICD-10-CM

## 2017-04-06 DIAGNOSIS — E1129 Type 2 diabetes mellitus with other diabetic kidney complication: Secondary | ICD-10-CM

## 2017-04-06 DIAGNOSIS — IMO0002 Reserved for concepts with insufficient information to code with codable children: Secondary | ICD-10-CM

## 2017-04-06 DIAGNOSIS — E1165 Type 2 diabetes mellitus with hyperglycemia: Secondary | ICD-10-CM

## 2017-04-06 LAB — POCT GLYCOSYLATED HEMOGLOBIN (HGB A1C): Hemoglobin A1C: 11.2

## 2017-04-06 MED ORDER — INSULIN DETEMIR 100 UNIT/ML FLEXPEN: 10.0000 [IU] | PEN_INJECTOR | Freq: Every day | SUBCUTANEOUS | 2 refills | Status: DC

## 2017-04-06 MED ORDER — EXENATIDE ER 2 MG/0.85ML ~~LOC~~ AUIJ: 2.0000 mg | AUTO-INJECTOR | SUBCUTANEOUS | 2 refills | Status: DC

## 2017-04-06 MED ORDER — EMPAGLIFLOZIN 10 MG PO TABS: 10.0000 mg | ORAL_TABLET | Freq: Every day | ORAL | 2 refills | Status: DC

## 2017-04-06 NOTE — Progress Notes (Signed)
Name: Janice Short   MRN: [id number]    DOB: [birth year]   Date:04/06/2017       Progress Note  Subjective  Chief Complaint  Chief Complaint  Patient presents with  . Diabetes    pt just started taking her insulin last week but has adjusted her diet    HPI  DMII with nephropathy and also neuropathy: she is currently metformin and Bydureon. She forgets to take Levemir at night so we stopped medication, she never got rx of Jardiance, but will get it today. She stopped drinking Sprite and juices. 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 HgbA1C is not at goal,  13.9 to 10.6 and now up 11.1 and unchanged today   She denies polyphagia, but has polydipsia and polyuria. No blurred vision.  She is on Gabapentin 600 mg twice daily and pain has been under better control. ARB for proteinuria. We may need to resume Levemir 10 units daily.     Patient Active Problem List   Diagnosis Date Noted  . Spinal stenosis 10/15/2016  . Diabetic polyneuropathy associated with type 2 diabetes mellitus ([place]) 07/14/2016  . Chronic midline low back pain with sciatica 06/22/2016  . Dyslipidemia 06/22/2016  . Primary osteoarthritis of both knees 06/22/2016  . Carpal tunnel syndrome 07/01/2015  . Cervical radiculitis 07/01/2015  . Corn or callus 07/01/2015  . Impingement syndrome of shoulder 07/01/2015  . Microalbuminuria 07/01/2015  . Morbid obesity ([place]) 07/01/2015  . Supraventricular tachycardia ([place]) 07/01/2015  . Compulsive tobacco user syndrome 07/01/2015  . Tenosynovitis of wrist 07/01/2015  . Benign hypertension 11/05/2009  . Uncontrolled type 2 diabetes mellitus with microalbuminuria ([place]) 11/05/2009  . Athlete's foot 08/05/2009  . Combined fat and carbohydrate induced hyperlipemia 01/08/2009  . Vitamin D deficiency 01/08/2009  . Asthma, mild intermittent 12/28/2008  . Allergic rhinitis 12/28/2008  . Lumbosacral neuritis 08/11/2007    Past Surgical History:   Procedure Laterality Date  . ABDOMINAL HYSTERECTOMY      Family History  Problem Relation Age of Onset  . Heart attack Mother   . CAD Mother   . Kidney disease Father     Social History   Social History  . Marital status: Single    Spouse name: N/A  . Number of children: N/A  . Years of education: N/A   Occupational History  . Not on file.   Social History Main Topics  . Smoking status: Current Some Day Smoker  . Smokeless tobacco: Never Used  . Alcohol use No  . Drug use: No  . Sexual activity: Yes   Other Topics Concern  . Not on file   Social History Narrative  . No narrative on file     Current Outpatient Prescriptions:  .  acetaminophen (TYLENOL) 500 MG tablet, Take 1 tablet by mouth at bedtime as needed., Disp: , Rfl:  .  albuterol (PROVENTIL HFA;VENTOLIN HFA) 108 (90 Base) MCG/ACT inhaler, Inhale 2 puffs into the lungs every 6 (six) hours as needed for wheezing or shortness of breath., Disp: 1 Inhaler, Rfl: 0 .  aspirin 81 MG tablet, Take 1 tablet by mouth daily., Disp: , Rfl:  .  atorvastatin (LIPITOR) 40 MG tablet, Take 1 tablet (40 mg total) by mouth at bedtime., Disp: 30 tablet, Rfl: 5 .  BAYER MICROLET LANCETS lancets, Use as instructed, Disp: 100 each, Rfl: 12 .  diclofenac sodium (VOLTAREN) 1 % GEL, Apply 4 g topically 4 (four) times daily.  BRAND NECESSARY, Disp: 100 g, Rfl: 2 .  empagliflozin (JARDIANCE) 10 MG TABS tablet, Take 10 mg by mouth daily., Disp: 30 tablet, Rfl: 2 .  Exenatide ER (BYDUREON BCISE) 2 MG/0.85ML AUIJ, Inject 2 mg into the skin once a week., Disp: 4 pen, Rfl: 2 .  fluticasone (FLONASE) 50 MCG/ACT nasal spray, Place 2 sprays into both nostrils at bedtime., Disp: 16 g, Rfl: 2 .  gabapentin (NEURONTIN) 300 MG capsule, Take 2 capsules (600 mg total) by mouth 2 (two) times daily., Disp: 120 capsule, Rfl: 2 .  glucose blood (BAYER CONTOUR TEST) test strip, Use as instructed, Disp: 100 each, Rfl: 12 .  losartan-hydrochlorothiazide  (HYZAAR) 50-12.5 MG tablet, Take 1 tablet by mouth daily., Disp: 90 tablet, Rfl: 3 .  metFORMIN (GLUCOPHAGE XR) 750 MG 24 hr tablet, Take 2 tablets (1,500 mg total) by mouth daily with breakfast., Disp: 60 tablet, Rfl: 2  Allergies  Allergen Reactions  . Other Shortness Of Breath    Cat dander and grass pollen     ROS  Ten systems reviewed and is negative except as mentioned in HPI   Objective  Vitals:   04/06/17 0950  BP: 122/70  Pulse: (!) 105  Resp: 16  Temp: 97.3 F (36.3 C)  SpO2: 97%  Weight: 206 lb 9 oz (93.7 kg)  Height: '5\' 7"'$  (1.702 m)    Body mass index is 32.35 kg/m.  Physical Exam  Constitutional: Patient appears well-developed and well-nourished. Obese  No distress.  HEENT: head atraumatic, normocephalic,   neck supple, throat within normal limits Cardiovascular: Normal rate, regular rhythm and normal heart sounds.  No murmur heard. No BLE edema. Pulmonary/Chest: Effort normal and breath sounds normal. No respiratory distress. Abdominal: Soft.  There is no tenderness. Psychiatric: Patient has a normal mood and affect. behavior is normal. Judgment and thought content normal.   Recent Results (from the past 2160 hour(s))  POCT HgB A1C     Status: Abnormal   Collection Time: 01/28/17 11:12 AM  Result Value Ref Range   Hemoglobin A1C 11.1   POCT HgB A1C     Status: Abnormal   Collection Time: 04/06/17  9:56 AM  Result Value Ref Range   Hemoglobin A1C 11.2       PHQ2/9: Depression screen [facility] 2/9 10/15/2016 07/14/2016 11/19/2015  Decreased Interest 0 0 0  Down, Depressed, Hopeless 0 1 0  PHQ - 2 Score 0 1 0     Fall Risk: Fall Risk  10/15/2016 07/14/2016 11/19/2015  Falls in the past year? No Yes Yes  Number falls in past yr: - 1 1  Injury with Fall? - Yes Yes  Follow up - Falls evaluation completed -     Assessment & Plan  1. Uncontrolled type 2 diabetes mellitus with microalbuminuria, without long-term current use of insulin ([place])  Just  started Bydureon last week, and for some reason is not taking Jardiance, advised to get Jardiance from pharmacy, continue Bydureon and return in 2 months for follow up and with a log of her glucose. She forgets to do daily injections. So she is off Levemir , but advised to resume Levemir - POCT HgB A1C - empagliflozin (JARDIANCE) 10 MG TABS tablet; Take 10 mg by mouth daily.  Dispense: 30 tablet; Refill: 2 - Exenatide ER (BYDUREON BCISE) 2 MG/0.85ML AUIJ; Inject 2 mg into the skin once a week.  Dispense: 4 pen; Refill: 2

## 2017-04-09 ENCOUNTER — Other Ambulatory Visit: Payer: Self-pay

## 2017-04-09 DIAGNOSIS — E1165 Type 2 diabetes mellitus with hyperglycemia: Principal | ICD-10-CM

## 2017-04-09 DIAGNOSIS — IMO0002 Reserved for concepts with insufficient information to code with codable children: Secondary | ICD-10-CM

## 2017-04-09 DIAGNOSIS — R809 Proteinuria, unspecified: Principal | ICD-10-CM

## 2017-04-09 DIAGNOSIS — E1129 Type 2 diabetes mellitus with other diabetic kidney complication: Secondary | ICD-10-CM

## 2017-04-09 MED ORDER — EXENATIDE ER 2 MG ~~LOC~~ PEN: 2.0000 mg | PEN_INJECTOR | SUBCUTANEOUS | 2 refills | Status: DC

## 2017-06-07 ENCOUNTER — Ambulatory Visit: Payer: Medicaid Other | Admitting: Family Medicine

## 2017-06-14 ENCOUNTER — Other Ambulatory Visit: Payer: Self-pay | Admitting: Family Medicine

## 2017-06-14 DIAGNOSIS — E1142 Type 2 diabetes mellitus with diabetic polyneuropathy: Secondary | ICD-10-CM

## 2017-06-14 DIAGNOSIS — G8929 Other chronic pain: Secondary | ICD-10-CM

## 2017-06-14 DIAGNOSIS — M4807 Spinal stenosis, lumbosacral region: Secondary | ICD-10-CM

## 2017-06-14 DIAGNOSIS — M5440 Lumbago with sciatica, unspecified side: Principal | ICD-10-CM

## 2017-06-14 NOTE — Telephone Encounter (Signed)
Patient requesting refill of Gabapentin to [facility].

## 2017-06-18 ENCOUNTER — Encounter: Payer: Self-pay | Admitting: Family Medicine

## 2017-06-18 ENCOUNTER — Ambulatory Visit (INDEPENDENT_AMBULATORY_CARE_PROVIDER_SITE_OTHER): Payer: Medicaid Other | Admitting: Family Medicine

## 2017-06-18 VITALS — BP 124/68 | HR 103 | Temp 97.6°F | Resp 18 | Ht 67.0 in | Wt 199.1 lb

## 2017-06-18 DIAGNOSIS — E785 Hyperlipidemia, unspecified: Secondary | ICD-10-CM

## 2017-06-18 DIAGNOSIS — M4807 Spinal stenosis, lumbosacral region: Secondary | ICD-10-CM

## 2017-06-18 DIAGNOSIS — E1129 Type 2 diabetes mellitus with other diabetic kidney complication: Secondary | ICD-10-CM

## 2017-06-18 DIAGNOSIS — E1165 Type 2 diabetes mellitus with hyperglycemia: Secondary | ICD-10-CM

## 2017-06-18 DIAGNOSIS — I10 Essential (primary) hypertension: Secondary | ICD-10-CM | POA: Diagnosis not present

## 2017-06-18 DIAGNOSIS — J3089 Other allergic rhinitis: Secondary | ICD-10-CM

## 2017-06-18 DIAGNOSIS — J302 Other seasonal allergic rhinitis: Secondary | ICD-10-CM

## 2017-06-18 DIAGNOSIS — E1142 Type 2 diabetes mellitus with diabetic polyneuropathy: Secondary | ICD-10-CM

## 2017-06-18 DIAGNOSIS — E1169 Type 2 diabetes mellitus with other specified complication: Secondary | ICD-10-CM

## 2017-06-18 DIAGNOSIS — M5440 Lumbago with sciatica, unspecified side: Secondary | ICD-10-CM

## 2017-06-18 DIAGNOSIS — R809 Proteinuria, unspecified: Secondary | ICD-10-CM | POA: Diagnosis not present

## 2017-06-18 DIAGNOSIS — G8929 Other chronic pain: Secondary | ICD-10-CM

## 2017-06-18 DIAGNOSIS — IMO0002 Reserved for concepts with insufficient information to code with codable children: Secondary | ICD-10-CM

## 2017-06-18 LAB — POCT GLYCOSYLATED HEMOGLOBIN (HGB A1C): HEMOGLOBIN A1C: 8.9

## 2017-06-18 MED ORDER — GABAPENTIN 300 MG PO CAPS
600.0000 mg | ORAL_CAPSULE | Freq: Two times a day (BID) | ORAL | 2 refills | Status: DC
Start: 2017-06-18 — End: 2017-12-17

## 2017-06-18 MED ORDER — METFORMIN HCL ER 750 MG PO TB24: 1500.0000 mg | ORAL_TABLET | Freq: Every day | ORAL | 2 refills | Status: DC

## 2017-06-18 MED ORDER — EMPAGLIFLOZIN 10 MG PO TABS: 10.0000 mg | ORAL_TABLET | Freq: Every day | ORAL | 2 refills | Status: DC

## 2017-06-18 MED ORDER — FLUTICASONE PROPIONATE 50 MCG/ACT NA SUSP: 2.0000 | Freq: Every day | NASAL | 2 refills | Status: DC

## 2017-06-18 MED ORDER — DICLOFENAC SODIUM 1 % TD GEL: 4.0000 g | Freq: Four times a day (QID) | TRANSDERMAL | 2 refills | Status: DC

## 2017-06-18 MED ORDER — INSULIN DEGLUDEC-LIRAGLUTIDE 100-3.6 UNIT-MG/ML ~~LOC~~ SOPN: 16.0000 [IU] | PEN_INJECTOR | Freq: Every day | SUBCUTANEOUS | 2 refills | Status: DC

## 2017-06-18 MED ORDER — ATORVASTATIN CALCIUM 40 MG PO TABS: 40.0000 mg | ORAL_TABLET | Freq: Every day | ORAL | 5 refills | Status: DC

## 2017-06-18 NOTE — Progress Notes (Signed)
Name: Janice Short   MRN: [id number]    DOB: 1961/04/25   Date:06/19/2017       Progress Note  Subjective  Chief Complaint  Chief Complaint  Patient presents with  . Diabetes    injection has begun to bruise her  . Medication Refill  . Hyperlipidemia  . Obesity  . Pain    been out of gabapentin for week    HPI  DMII with nephropathy and also neuropathy: she is currently on Levemir, metformin and Bydureon, she forgets to take Levemir and stopped Bydureon 2 weeks ago because states needle is too painful. She has changed her diet, avoiding sweets, drinking water, losing weight.   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 HgbA1C is not at goal but has improved,  13.9 to 10.6 , 11.1 down to 8.6% today  She denies polyphagia, but has polydipsia and polyuria. No blurred vision. She is on Gabapentin 600 mg twice daily and pain has been under better control. ARB for proteinuria   HTN: she is compliant with medication and bp is at goal. She denies chest pain or palpitation. No side effects of medication   Hyperlipidemia: she is back on Lipitor, she needs to eat fish and tree nuts more often.   Joint deformity and joint pain: her sister has RA, she was seen by Rheumatologist, and Ortho , had MRI lumbar spine and has spinal stenosis, seen by neurosurgeon but she afraid of having surgery at this time, she is on Gabapentin She still has paresthesia on left lateral lower leg  Obesity: she has lost 8 lbs since last visit, she has changed her life style and is on GLP-1  Asthma Mild Intermittent: she has intermittent symptoms during Spring , no cough, wheezing or SOB.    Patient Active Problem List   Diagnosis Date Noted  . Spinal stenosis 10/15/2016  . Diabetic polyneuropathy associated with type 2 diabetes mellitus ([place]) 07/14/2016  . Chronic midline low back pain with sciatica 06/22/2016  . Dyslipidemia 06/22/2016  . Primary osteoarthritis of both knees  06/22/2016  . Carpal tunnel syndrome 07/01/2015  . Cervical radiculitis 07/01/2015  . Corn or callus 07/01/2015  . Impingement syndrome of shoulder 07/01/2015  . Microalbuminuria 07/01/2015  . Morbid obesity ([place]) 07/01/2015  . Supraventricular tachycardia ([place]) 07/01/2015  . Compulsive tobacco user syndrome 07/01/2015  . Tenosynovitis of wrist 07/01/2015  . Benign hypertension 11/05/2009  . Uncontrolled type 2 diabetes mellitus with microalbuminuria ([place]) 11/05/2009  . Athlete's foot 08/05/2009  . Combined fat and carbohydrate induced hyperlipemia 01/08/2009  . Vitamin D deficiency 01/08/2009  . Asthma, mild intermittent 12/28/2008  . Allergic rhinitis 12/28/2008  . Lumbosacral neuritis 08/11/2007    Past Surgical History:  Procedure Laterality Date  . ABDOMINAL HYSTERECTOMY      Family History  Problem Relation Age of Onset  . Heart attack Mother   . CAD Mother   . Kidney disease Father     Social History   Social History  . Marital status: Single    Spouse name: N/A  . Number of children: N/A  . Years of education: N/A   Occupational History  . Not on file.   Social History Main Topics  . Smoking status: Current Some Day Smoker  . Smokeless tobacco: Never Used  . Alcohol use No  . Drug use: No  . Sexual activity: Yes   Other Topics Concern  . Not on file  Social History Narrative  . No narrative on file     Current Outpatient Prescriptions:  .  acetaminophen (TYLENOL) 500 MG tablet, Take 1 tablet by mouth at bedtime as needed., Disp: , Rfl:  .  albuterol (PROVENTIL HFA;VENTOLIN HFA) 108 (90 Base) MCG/ACT inhaler, Inhale 2 puffs into the lungs every 6 (six) hours as needed for wheezing or shortness of breath., Disp: 1 Inhaler, Rfl: 0 .  aspirin 81 MG tablet, Take 1 tablet by mouth daily., Disp: , Rfl:  .  atorvastatin (LIPITOR) 40 MG tablet, Take 1 tablet (40 mg total) by mouth at bedtime., Disp: 30 tablet, Rfl: 5 .  BAYER MICROLET LANCETS lancets,  Use as instructed, Disp: 100 each, Rfl: 12 .  diclofenac sodium (VOLTAREN) 1 % GEL, Apply 4 g topically 4 (four) times daily. BRAND NECESSARY, Disp: 100 g, Rfl: 2 .  empagliflozin (JARDIANCE) 10 MG TABS tablet, Take 10 mg by mouth daily., Disp: 30 tablet, Rfl: 2 .  fluticasone (FLONASE) 50 MCG/ACT nasal spray, Place 2 sprays into both nostrils at bedtime., Disp: 16 g, Rfl: 2 .  gabapentin (NEURONTIN) 300 MG capsule, Take 2 capsules (600 mg total) by mouth 2 (two) times daily., Disp: 120 capsule, Rfl: 2 .  glucose blood (BAYER CONTOUR TEST) test strip, Use as instructed, Disp: 100 each, Rfl: 12 .  Insulin Degludec-Liraglutide (XULTOPHY) 100-3.6 UNIT-MG/ML SOPN, Inject 16-50 Units into the skin daily. Start at 8 and go up by 2 units every 2 days for fasting 90-140, Disp: 9 mL, Rfl: 2 .  losartan-hydrochlorothiazide (HYZAAR) 50-12.5 MG tablet, Take 1 tablet by mouth daily., Disp: 90 tablet, Rfl: 3 .  metFORMIN (GLUCOPHAGE XR) 750 MG 24 hr tablet, Take 2 tablets (1,500 mg total) by mouth daily with breakfast., Disp: 60 tablet, Rfl: 2  Allergies  Allergen Reactions  . Other Shortness Of Breath    Cat dander and grass pollen  . Bydureon [Exenatide] Other (See Comments)    Pain with injection not allergy     ROS  Constitutional: Negative for fever, positive for weight change.  Respiratory: Negative for cough and shortness of breath.   Cardiovascular: Negative for chest pain or palpitations.  Gastrointestinal: Negative for abdominal pain, no bowel changes.  Musculoskeletal: Postiive for gait problem no joint swelling.  Skin: Negative for rash.  Neurological: Negative for dizziness or headache.  No other specific complaints in a complete review of systems (except as listed in HPI above).   Objective  Vitals:   06/18/17 1125  BP: 124/68  Pulse: (!) 103  Resp: 18  Temp: 97.6 F (36.4 C)  SpO2: 97%  Weight: 199 lb 1 oz (90.3 kg)  Height: 5\' 7"  (1.702 m)    Body mass index is 31.18  kg/m.  Physical Exam  Constitutional: Patient appears well-developed and well-nourished. Obese No distress.  HEENT: head atraumatic, normocephalic, pupils equal and reactive to light,  neck supple, throat within normal limits Cardiovascular: Normal rate, regular rhythm and normal heart sounds.  No murmur heard. No BLE edema. Pulmonary/Chest: Effort normal and breath sounds normal. No respiratory distress. Abdominal: Soft.  There is no tenderness. Psychiatric: Patient has a normal mood and affect. behavior is normal. Judgment and thought content normal. Muscular Skeletal: pain during palpation of lumbar spine, negative straight leg raise, pain during abduction of left arm, she states she ran out of voltaren gel, deformities on hands and feet ( bunion formation)   Recent Results (from the past 2160 hour(s))  POCT HgB A1C  Status: Abnormal   Collection Time: 04/06/17  9:56 AM  Result Value Ref Range   Hemoglobin A1C 11.2   POCT HgB A1C     Status: Abnormal   Collection Time: 06/18/17 11:35 AM  Result Value Ref Range   Hemoglobin A1C 8.9      PHQ2/9: Depression screen [facility] 2/9 10/15/2016 07/14/2016 11/19/2015  Decreased Interest 0 0 0  Down, Depressed, Hopeless 0 1 0  PHQ - 2 Score 0 1 0    Fall Risk: Fall Risk  10/15/2016 07/14/2016 11/19/2015  Falls in the past year? No Yes Yes  Number falls in past yr: - 1 1  Injury with Fall? - Yes Yes  Follow up - Falls evaluation completed -    Assessment & Plan  1. Uncontrolled type 2 diabetes mellitus with microalbuminuria, without long-term current use of insulin ([facility])  We will change from Levemir and Bydureon to Xultophy since patient stopped Bydureon secondary to injection site pain, but is responding to class of medication and will increase compliance with one shot with both medications.  - POCT HgB A1C - Insulin Degludec-Liraglutide (XULTOPHY) 100-3.6 UNIT-MG/ML SOPN; Inject 16-50 Units into the skin daily. Start at 66 and go up by  2 units every 2 days for fasting 90-140  Dispense: 9 mL; Refill: 2 - empagliflozin (JARDIANCE) 10 MG TABS tablet; Take 10 mg by mouth daily.  Dispense: 30 tablet; Refill: 2  2. Benign hypertension   at goal, continue medication  3. Dyslipidemia associated with type 2 diabetes mellitus ([facility])  - Insulin Degludec-Liraglutide (XULTOPHY) 100-3.6 UNIT-MG/ML SOPN; Inject 16-50 Units into the skin daily. Start at 27 and go up by 2 units every 2 days for fasting 90-140  Dispense: 9 mL; Refill: 2 - atorvastatin (LIPITOR) 40 MG tablet; Take 1 tablet (40 mg total) by mouth at bedtime.  Dispense: 30 tablet; Refill: 5  4. Diabetic polyneuropathy associated with type 2 diabetes mellitus ([place])  Doing well, however stopped Bydureon because of pain with injection, we will try Xultophy - Insulin Degludec-Liraglutide (XULTOPHY) 100-3.6 UNIT-MG/ML SOPN; Inject 16-50 Units into the skin daily. Start at 44 and go up by 2 units every 2 days for fasting 90-140  Dispense: 9 mL; Refill: 2 - metFORMIN (GLUCOPHAGE XR) 750 MG 24 hr tablet; Take 2 tablets (1,500 mg total) by mouth daily with breakfast.  Dispense: 60 tablet; Refill: 2 - gabapentin (NEURONTIN) 300 MG capsule; Take 2 capsules (600 mg total) by mouth 2 (two) times daily.  Dispense: 120 capsule; Refill: 2  5. Morbid obesity, unspecified obesity type ([place])  Doing well on GLP-1 agonist, lost 8 lbs since 04/2017  6. Chronic midline low back pain with sciatica, sciatica laterality unspecified  - gabapentin (NEURONTIN) 300 MG capsule; Take 2 capsules (600 mg total) by mouth 2 (two) times daily.  Dispense: 120 capsule; Refill: 2  7. Spinal stenosis of lumbosacral region  - gabapentin (NEURONTIN) 300 MG capsule; Take 2 capsules (600 mg total) by mouth 2 (two) times daily.  Dispense: 120 capsule; Refill: 2  8. Perennial allergic rhinitis with seasonal variation  - fluticasone (FLONASE) 50 MCG/ACT nasal spray; Place 2 sprays into both nostrils at bedtime.   Dispense: 16 g; Refill: 2

## 2017-07-13 ENCOUNTER — Other Ambulatory Visit: Payer: Self-pay | Admitting: Family Medicine

## 2017-07-13 DIAGNOSIS — J4520 Mild intermittent asthma, uncomplicated: Secondary | ICD-10-CM

## 2017-09-16 ENCOUNTER — Ambulatory Visit: Payer: Medicaid Other | Admitting: Family Medicine

## 2017-09-30 ENCOUNTER — Telehealth: Payer: Self-pay

## 2017-09-30 DIAGNOSIS — E1142 Type 2 diabetes mellitus with diabetic polyneuropathy: Secondary | ICD-10-CM

## 2017-09-30 DIAGNOSIS — M5440 Lumbago with sciatica, unspecified side: Secondary | ICD-10-CM

## 2017-09-30 DIAGNOSIS — G8929 Other chronic pain: Secondary | ICD-10-CM

## 2017-09-30 DIAGNOSIS — M4807 Spinal stenosis, lumbosacral region: Secondary | ICD-10-CM
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
## 2017-10-01 MED ORDER — METFORMIN HCL ER 750 MG PO TB24: 1500.0000 mg | ORAL_TABLET | Freq: Every day | ORAL | 0 refills | Status: DC

## 2017-10-01 NOTE — Addendum Note (Signed)
Addended by: Steele Sizer F on: 10/01/2017 12:34 PM   Modules accepted: Orders

## 2017-10-01 NOTE — Telephone Encounter (Signed)
Spoke with patient and she refused appointment. Stated that she can not come in until she get paid at the end of the month.

## 2017-10-09 ENCOUNTER — Other Ambulatory Visit: Payer: Self-pay | Admitting: Family Medicine

## 2017-10-09 DIAGNOSIS — E1142 Type 2 diabetes mellitus with diabetic polyneuropathy: Secondary | ICD-10-CM

## 2017-10-09 DIAGNOSIS — M4807 Spinal stenosis, lumbosacral region: Secondary | ICD-10-CM

## 2017-10-09 DIAGNOSIS — M5440 Lumbago with sciatica, unspecified side: Principal | ICD-10-CM

## 2017-10-09 DIAGNOSIS — G8929 Other chronic pain: Secondary | ICD-10-CM

## 2017-11-16 ENCOUNTER — Ambulatory Visit: Payer: Medicaid Other | Admitting: Family Medicine

## 2017-12-08 ENCOUNTER — Other Ambulatory Visit: Payer: Self-pay | Admitting: Family Medicine

## 2017-12-08 DIAGNOSIS — J4520 Mild intermittent asthma, uncomplicated: Secondary | ICD-10-CM

## 2017-12-17 ENCOUNTER — Encounter: Payer: Self-pay | Admitting: Family Medicine

## 2017-12-17 ENCOUNTER — Ambulatory Visit: Payer: Medicaid Other | Admitting: Family Medicine

## 2017-12-17 VITALS — BP 122/68 | HR 82 | Temp 98.2°F | Resp 16 | Ht 67.0 in | Wt 187.3 lb

## 2017-12-17 DIAGNOSIS — E559 Vitamin D deficiency, unspecified: Secondary | ICD-10-CM | POA: Diagnosis not present

## 2017-12-17 DIAGNOSIS — E785 Hyperlipidemia, unspecified: Secondary | ICD-10-CM

## 2017-12-17 DIAGNOSIS — E1169 Type 2 diabetes mellitus with other specified complication: Secondary | ICD-10-CM

## 2017-12-17 DIAGNOSIS — E1142 Type 2 diabetes mellitus with diabetic polyneuropathy: Secondary | ICD-10-CM

## 2017-12-17 DIAGNOSIS — Z9114 Patient's other noncompliance with medication regimen: Secondary | ICD-10-CM | POA: Diagnosis not present

## 2017-12-17 DIAGNOSIS — Z91148 Patient's other noncompliance with medication regimen for other reason: Secondary | ICD-10-CM | POA: Insufficient documentation

## 2017-12-17 DIAGNOSIS — M4807 Spinal stenosis, lumbosacral region: Secondary | ICD-10-CM | POA: Diagnosis not present

## 2017-12-17 DIAGNOSIS — I10 Essential (primary) hypertension: Secondary | ICD-10-CM

## 2017-12-17 DIAGNOSIS — E1129 Type 2 diabetes mellitus with other diabetic kidney complication: Secondary | ICD-10-CM | POA: Diagnosis not present

## 2017-12-17 DIAGNOSIS — R809 Proteinuria, unspecified: Secondary | ICD-10-CM

## 2017-12-17 DIAGNOSIS — E1165 Type 2 diabetes mellitus with hyperglycemia: Secondary | ICD-10-CM | POA: Diagnosis not present

## 2017-12-17 DIAGNOSIS — M5440 Lumbago with sciatica, unspecified side: Secondary | ICD-10-CM

## 2017-12-17 DIAGNOSIS — J4520 Mild intermittent asthma, uncomplicated: Secondary | ICD-10-CM | POA: Diagnosis not present

## 2017-12-17 DIAGNOSIS — IMO0002 Reserved for concepts with insufficient information to code with codable children: Secondary | ICD-10-CM

## 2017-12-17 DIAGNOSIS — G8929 Other chronic pain: Secondary | ICD-10-CM

## 2017-12-17 LAB — POCT GLYCOSYLATED HEMOGLOBIN (HGB A1C)

## 2017-12-17 LAB — POCT UA - MICROALBUMIN: Microalbumin Ur, POC: 20 mg/L

## 2017-12-17 LAB — GLUCOSE, POCT (MANUAL RESULT ENTRY): POC GLUCOSE: 315 mg/dL — AB (ref 70–99)

## 2017-12-17 MED ORDER — ATORVASTATIN CALCIUM 40 MG PO TABS: 40.0000 mg | ORAL_TABLET | Freq: Every day | ORAL | 0 refills | Status: DC

## 2017-12-17 MED ORDER — EMPAGLIFLOZIN-METFORMIN HCL ER 25-1000 MG PO TB24: 1.0000 | ORAL_TABLET | Freq: Every day | ORAL | 0 refills | Status: DC

## 2017-12-17 MED ORDER — INSULIN PEN NEEDLE 32G X 6 MM MISC
1.0000 | Freq: Every day | 1 refills | Status: DC
Start: 2017-12-17 — End: 2018-03-24

## 2017-12-17 MED ORDER — GABAPENTIN 300 MG PO CAPS: 300.0000 mg | ORAL_CAPSULE | Freq: Two times a day (BID) | ORAL | 0 refills | Status: DC

## 2017-12-17 MED ORDER — ALBUTEROL SULFATE HFA 108 (90 BASE) MCG/ACT IN AERS: INHALATION_SPRAY | RESPIRATORY_TRACT | 2 refills | Status: DC

## 2017-12-17 MED ORDER — INSULIN DEGLUDEC-LIRAGLUTIDE 100-3.6 UNIT-MG/ML ~~LOC~~ SOPN: 16.0000 [IU] | PEN_INJECTOR | Freq: Every day | SUBCUTANEOUS | 2 refills | Status: DC

## 2017-12-17 NOTE — Patient Instructions (Addendum)
Call our office with your FASTING (NOTHING TO EAT OR DRINK) blood sugar - CALL us - Monday, Wednesday, and Friday of next week.  - Xultophy (INJECTION): Use 16 units daily. - STOP Metformin and Jardiance.  START Synjardy.  - Gabapentin:  1. Start by taking 300mg  (1 tablet) at night for 3-5 days 2. Then increase to 1 tablet twice a day (morning and night) for 3-5 days. 3. Then increase to 1 tablet in the morning and 2 tablets at night for 3-5. 4. Then increase to 3 tablets in the morning and at night.  Please schedule your eye examination as soon as possible

## 2017-12-17 NOTE — Progress Notes (Signed)
Name: Janice Short   MRN: [id number]    DOB: [birth year]   Date:12/17/2017       Progress Note  Subjective  Chief Complaint  Chief Complaint  Patient presents with  . Diabetes    BS runnings 145 at home  . Follow-up  . Medication Refill    HPI  DMII with nephropathy and also neuropathy: she is currently on Metformin, Jardiance, and Xultophy; we will combine to [place] today to help with cost.  She has been out of Xultophy for at least 3 weeks, but was tolerating well before that - reports increased satiety which has been benefiting her weightloss .  Her A1C today is >14, CBG is 315 (she notes she drank some pineapple orange juice before she came today).  She did not do well on her diet during the holidays - ate more sweets than usual; drinking plenty of water.  She has lost about 12lbs since last visit.She is no longer working and says she gets bored which drives her to eat junk food.  She denies polyphagia, polydipsia, or polyuria.  No blurred vision. She was on Gabapentin 600 mg twice daily but has been out for about 3 months; pain was well controlled when taking Gabapentin - we will restart today. ARB for proteinuria - improved since last check in 2016.  She is able to check BG's at home, but is very inconsistent - was 178 on Saturday (6 days ago).  She is uncomfortable titrating Xultophy on her own.   HTN: She is compliant with medication and bp is at goal. She denies chest pain, shortness of breath, BLE edema, or palpitation. No side effects of medication. Taking Hyzaar.  Hyperlipidemia: she has not been taking Lipitor in several weeks.  She avoids fried foods and butter; she needs to eat fish and tree nuts more often. We will check lipid panel when she returns fasting.  Joint deformity and joint pain: her sister has RA, she was seen by Rheumatologist, and Ortho , had MRI lumbar spine and has spinal stenosis, seen by neurosurgeon but she afraid of having surgery at this time, she is  on Gabapentin She still has intermittent paresthesia on left lateral lower leg; also notes RLE has occasional paresthesia.  She has been out of her Gabapentin for about 3 months.  Obesity: she has lost 12 lbs since last visit, she has changed her life style; has been out of GLP1 for several weeks.  Asthma Mild Intermittent: she has intermittent symptoms during Spring , no cough, wheezing or SOB. Needs Albuterol Refill today - uses inhaler intermittently depending on weather.  Patient Active Problem List   Diagnosis Date Noted  . Dyslipidemia associated with type 2 diabetes mellitus ([place]) 12/17/2017  . Spinal stenosis 10/15/2016  . Diabetic polyneuropathy associated with type 2 diabetes mellitus ([place]) 07/14/2016  . Chronic midline low back pain with sciatica 06/22/2016  . Primary osteoarthritis of both knees 06/22/2016  . Carpal tunnel syndrome 07/01/2015  . Cervical radiculitis 07/01/2015  . Corn or callus 07/01/2015  . Impingement syndrome of shoulder 07/01/2015  . Microalbuminuria 07/01/2015  . Morbid obesity ([place]) 07/01/2015  . Supraventricular tachycardia ([place]) 07/01/2015  . Compulsive tobacco user syndrome 07/01/2015  . Tenosynovitis of wrist 07/01/2015  . Benign hypertension 11/05/2009  . Uncontrolled type 2 diabetes mellitus with microalbuminuria ([place]) 11/05/2009  . Athlete's foot 08/05/2009  . Combined fat and carbohydrate induced hyperlipemia 01/08/2009  . Vitamin D deficiency 01/08/2009  . Asthma, mild intermittent 12/28/2008  .  Allergic rhinitis 12/28/2008  . Lumbosacral neuritis 08/11/2007    Past Surgical History:  Procedure Laterality Date  . ABDOMINAL HYSTERECTOMY      Family History  Problem Relation Age of Onset  . Heart attack Mother   . CAD Mother   . Kidney disease Father     Social History   Socioeconomic History  . Marital status: Single    Spouse name: Not on file  . Number of children: Not on file  . Years of education: Not on file  .  Highest education level: Not on file  Social Needs  . Financial resource strain: Not on file  . Food insecurity - worry: Not on file  . Food insecurity - inability: Not on file  . Transportation needs - medical: Not on file  . Transportation needs - non-medical: Not on file  Occupational History  . Not on file  Tobacco Use  . Smoking status: Current Some Day Smoker  . Smokeless tobacco: Never Used  Substance and Sexual Activity  . Alcohol use: No    Alcohol/week: 0.0 oz  . Drug use: No  . Sexual activity: Yes  Other Topics Concern  . Not on file  Social History Narrative  . Not on file     Current Outpatient Medications:  .  acetaminophen (TYLENOL) 500 MG tablet, Take 1 tablet by mouth at bedtime as needed., Disp: , Rfl:  .  albuterol (VENTOLIN HFA) 108 (90 Base) MCG/ACT inhaler, INHALE 2 PUFFS BY MOUTH EVERY 6 HOURS AS NEEDED FOR WHEEZING OR  SHORTNESS  OF  BREATH, Disp: 1 Inhaler, Rfl: 2 .  aspirin 81 MG tablet, Take 1 tablet by mouth daily., Disp: , Rfl:  .  atorvastatin (LIPITOR) 40 MG tablet, Take 1 tablet (40 mg total) by mouth at bedtime., Disp: 90 tablet, Rfl: 0 .  BAYER MICROLET LANCETS lancets, Use as instructed, Disp: 100 each, Rfl: 12 .  diclofenac sodium (VOLTAREN) 1 % GEL, Apply 4 g topically 4 (four) times daily. BRAND NECESSARY, Disp: 100 g, Rfl: 2 .  empagliflozin (JARDIANCE) 10 MG TABS tablet, Take 10 mg by mouth daily., Disp: 30 tablet, Rfl: 2 .  fluticasone (FLONASE) 50 MCG/ACT nasal spray, Place 2 sprays into both nostrils at bedtime., Disp: 16 g, Rfl: 2 .  gabapentin (NEURONTIN) 300 MG capsule, Take 1-2 capsules (300-600 mg total) by mouth 2 (two) times daily. Take 1 tab at night x3-5days; then take 1 tab twice daily x3-5days; then take 2tab twice daily., Disp: 90 capsule, Rfl: 0 .  glucose blood (BAYER CONTOUR TEST) test strip, Use as instructed, Disp: 100 each, Rfl: 12 .  Insulin Degludec-Liraglutide (XULTOPHY) 100-3.6 UNIT-MG/ML SOPN, Inject 16-50 Units  into the skin daily. Start at 8 and go up by 2 units every 2 days for fasting 90-140, Disp: 9 mL, Rfl: 2 .  losartan-hydrochlorothiazide (HYZAAR) 50-12.5 MG tablet, Take 1 tablet by mouth daily., Disp: 90 tablet, Rfl: 3 .  metFORMIN (GLUCOPHAGE XR) 750 MG 24 hr tablet, Take 2 tablets (1,500 mg total) by mouth daily with breakfast., Disp: 60 tablet, Rfl: 0 .  Empagliflozin-Metformin HCl ER (SYNJARDY XR) 25-1000 MG TB24, Take 1 tablet by mouth daily., Disp: 90 tablet, Rfl: 0 .  Insulin Pen Needle (NOVOFINE) 32G X 6 MM MISC, 1 each by Does not apply route daily., Disp: 100 each, Rfl: 1  Allergies  Allergen Reactions  . Other Shortness Of Breath    Cat dander and grass pollen  . Bydureon [Exenatide] Other (  See Comments)    Pain with injection not allergy     ROS  Constitutional: Negative for fever or weight change.  Respiratory: Negative for cough and shortness of breath.   Cardiovascular: Negative for chest pain or palpitations.  Gastrointestinal: Negative for abdominal pain, no bowel changes.  Musculoskeletal: Negative for gait problem or joint swelling.  Skin: Negative for rash.  Neurological: Negative for dizziness or headache.  No other specific complaints in a complete review of systems (except as listed in HPI above).  Objective  Vitals:   12/17/17 1342  BP: 122/68  Pulse: 82  Resp: 16  Temp: 98.2 F (36.8 C)  TempSrc: Oral  SpO2: 97%  Weight: 187 lb 4.8 oz (85 kg)  Height: 5\' 7"  (1.702 m)   Body mass index is 29.34 kg/m.  Physical Exam Constitutional: Patient appears well-developed and well-nourished. No distress.  HENT: Head: Normocephalic and atraumatic. Mouth/Throat: Oropharynx is clear and moist. No oropharyngeal exudate.  Eyes: Conjunctivae and EOM are normal. Pupils are equal, round, and reactive to light. No scleral icterus.  Neck: Normal range of motion. Neck supple. No JVD present.   Cardiovascular: Normal rate, regular rhythm and normal heart sounds.  No  murmur heard. No BLE edema. Pulmonary/Chest: Effort normal and breath sounds normal. No respiratory distress. Musculoskeletal: Normal range of motion, no joint effusions. No gross deformities Neurological: she is alert and oriented to person, place, and time. No cranial nerve deficit. Coordination, balance, strength, speech and gait are normal.  Skin: Skin is warm and dry. No rash noted. No erythema.  Psychiatric: Patient has a normal mood and affect. behavior is normal. Judgment and thought content normal.  Recent Results (from the past 2160 hour(s))  POCT glycosylated hemoglobin (Hb A1C)     Status: None   Collection Time: 12/17/17  1:49 PM  Result Value Ref Range   Hemoglobin A1C >14.0   POCT Glucose (CBG)     Status: Abnormal   Collection Time: 12/17/17  1:50 PM  Result Value Ref Range   POC Glucose 315 (A) 70 - 99 mg/dl  POCT UA - Microalbumin     Status: None   Collection Time: 12/17/17  2:05 PM  Result Value Ref Range   Microalbumin Ur, POC 20 mg/L   Creatinine, POC  mg/dL   Albumin/Creatinine Ratio, Urine, POC      Diabetic Foot Exam: Diabetic Foot Exam - Simple   Simple Foot Form Visual Inspection See comments:  Yes Sensation Testing Intact to touch and monofilament testing bilaterally:  Yes Pulse Check Posterior Tibialis and Dorsalis pulse intact bilaterally:  Yes Comments Several toes exhibit curvature; bilateral medial pads of foot have corn present; onychomycosis present bilaterally.     PHQ2/9: Depression screen [facility] 2/9 12/17/2017 10/15/2016 07/14/2016 11/19/2015  Decreased Interest 0 0 0 0  Down, Depressed, Hopeless 0 0 1 0  PHQ - 2 Score 0 0 1 0   Fall Risk: Fall Risk  12/17/2017 10/15/2016 07/14/2016 11/19/2015  Falls in the past year? No No Yes Yes  Number falls in past yr: - - 1 1  Injury with Fall? - - Yes Yes  Comment - - - left knee  Follow up - - Falls evaluation completed -   Assessment & Plan  1. Uncontrolled type 2 diabetes mellitus with  microalbuminuria (HCC) - POCT UA - Microalbumin - POCT glycosylated hemoglobin (Hb A1C) - Insulin Degludec-Liraglutide (XULTOPHY) 100-3.6 UNIT-MG/ML SOPN; Inject 16-50 Units into the skin daily. Start at 63 and  go up by 2 units every 2 days for fasting 90-140  Dispense: 9 mL; Refill: 2 - Insulin Pen Needle (NOVOFINE) 32G X 6 MM MISC; 1 each by Does not apply route daily.  Dispense: 100 each; Refill: 1 - Empagliflozin-Metformin HCl ER (SYNJARDY XR) 25-1000 MG TB24; Take 1 tablet by mouth daily.  Dispense: 90 tablet; Refill: 0 - See AVS for BG management plan as patient is unable to keep close follow up and is uncomfortable titrating up on Xultophy without assistance.  She will call three times next week with her fasting BG's and we will advise on titration of Xultophy.  2. Benign hypertension - Continue Hyzaar.  3. Mild intermittent asthma without complication - albuterol (VENTOLIN HFA) 108 (90 Base) MCG/ACT inhaler; INHALE 2 PUFFS BY MOUTH EVERY 6 HOURS AS NEEDED FOR WHEEZING OR  SHORTNESS  OF  BREATH  Dispense: 1 Inhaler; Refill: 2 - Stable  4. Diabetic polyneuropathy associated with type 2 diabetes mellitus (HCC) - POCT Glucose (CBG) - Insulin Degludec-Liraglutide (XULTOPHY) 100-3.6 UNIT-MG/ML SOPN; Inject 16-50 Units into the skin daily. Start at 78 and go up by 2 units every 2 days for fasting 90-140  Dispense: 9 mL; Refill: 2 - Insulin Pen Needle (NOVOFINE) 32G X 6 MM MISC; 1 each by Does not apply route daily.  Dispense: 100 each; Refill: 1 - gabapentin (NEURONTIN) 300 MG capsule; Take 1-2 capsules (300-600 mg total) by mouth 2 (two) times daily. Take 1 tab at night x3-5days; then take 1 tab twice daily x3-5days; then take 2tab twice daily.  Dispense: 90 capsule; Refill: 0 - Taper back up slowly on Gabapentin - Empagliflozin-Metformin HCl ER (SYNJARDY XR) 25-1000 MG TB24; Take 1 tablet by mouth daily.  Dispense: 90 tablet; Refill: 0  5. Morbid obesity ([place]) - Continue to work on eating  healthy; on GLP-1  6. Vitamin D deficiency - Vitamin D check today.  7. Dyslipidemia associated with type 2 diabetes mellitus (HCC) - Insulin Degludec-Liraglutide (XULTOPHY) 100-3.6 UNIT-MG/ML SOPN; Inject 16-50 Units into the skin daily. Start at 88 and go up by 2 units every 2 days for fasting 90-140  Dispense: 9 mL; Refill: 2 - atorvastatin (LIPITOR) 40 MG tablet; Take 1 tablet (40 mg total) by mouth at bedtime.  Dispense: 90 tablet; Refill: 0 - Lipid panel  8. Uncontrolled type 2 diabetes mellitus with microalbuminuria, without long-term current use of insulin (HCC) - Insulin Degludec-Liraglutide (XULTOPHY) 100-3.6 UNIT-MG/ML SOPN; Inject 16-50 Units into the skin daily. Start at 95 and go up by 2 units every 2 days for fasting 90-140  Dispense: 9 mL; Refill: 2  9. Dyslipidemia associated with type 2 diabetes mellitus (HCC) - Insulin Degludec-Liraglutide (XULTOPHY) 100-3.6 UNIT-MG/ML SOPN; Inject 16-50 Units into the skin daily. Start at 1 and go up by 2 units every 2 days for fasting 90-140  Dispense: 9 mL; Refill: 2 - atorvastatin (LIPITOR) 40 MG tablet; Take 1 tablet (40 mg total) by mouth at bedtime.  Dispense: 90 tablet; Refill: 0  10. Chronic midline low back pain with sciatica, sciatica laterality unspecified - gabapentin (NEURONTIN) 300 MG capsule; Take 1-2 capsules (300-600 mg total) by mouth 2 (two) times daily. Take 1 tab at night x3-5days; then take 1 tab twice daily x3-5days; then take 2tab twice daily.  Dispense: 90 capsule; Refill: 0  11. Spinal stenosis of lumbosacral region - gabapentin (NEURONTIN) 300 MG capsule; Take 1-2 capsules (300-600 mg total) by mouth 2 (two) times daily. Take 1 tab at night x3-5days; then  take 1 tab twice daily x3-5days; then take 2tab twice daily.  Dispense: 90 capsule; Refill: 0  10. Noncompliance with medication regimen - She sometimes has difficulty affording her medications and affording co-pay for office visits. - Several of her  medications are combination pills to lessen pill burden and hopefully her cost - Xultophy, Hyzaar, and Synjardy. - We will work with her on diabetes management over the telephone over the next 1-2 weeks to titrate up on [place] per AVS.  - Discussed need for compliance at length, and patient verbalizes understanding; she is aware that we are here to help in any way that we can to ensure that she is obtaining the care that she needs.  -Reviewed Health Maintenance: Foot exam done today;  She will try to schedule eye examination when she can afford it; declines vaccines.

## 2017-12-21 ENCOUNTER — Telehealth: Payer: Self-pay | Admitting: Emergency Medicine

## 2017-12-21 NOTE — Telephone Encounter (Signed)
Patient called [facility] and BS running 287

## 2017-12-21 NOTE — Telephone Encounter (Signed)
Increase Xultophy to 18 units daily, call back on Wednesday with morning fasting BG and we will continue to titrate as needed. Please thank her for calling in and encourage her to continue so that we may continue to help her.

## 2017-12-21 NOTE — Telephone Encounter (Signed)
Left message for Janice Short at [phone]. The other number is wrong number.

## 2017-12-22 NOTE — Telephone Encounter (Signed)
Left message for patient to call office.  

## 2017-12-22 NOTE — Telephone Encounter (Signed)
Please try checking in again today with Janice Short. Thank you!

## 2017-12-23 ENCOUNTER — Encounter: Payer: Self-pay | Admitting: Family Medicine

## 2017-12-23 ENCOUNTER — Telehealth: Payer: Self-pay | Admitting: Family Medicine

## 2017-12-23 DIAGNOSIS — R809 Proteinuria, unspecified: Principal | ICD-10-CM

## 2017-12-23 DIAGNOSIS — E1165 Type 2 diabetes mellitus with hyperglycemia: Principal | ICD-10-CM

## 2017-12-23 DIAGNOSIS — E1129 Type 2 diabetes mellitus with other diabetic kidney complication: Secondary | ICD-10-CM

## 2017-12-23 DIAGNOSIS — IMO0002 Reserved for concepts with insufficient information to code with codable children: Secondary | ICD-10-CM

## 2017-12-23 MED ORDER — GLUCOSE BLOOD VI STRP: ORAL_STRIP | 2 refills | Status: DC

## 2017-12-23 NOTE — Telephone Encounter (Signed)
Copied from [place] [id number]. Topic: General - Other >> Dec 21, 2017  9:07 AM Carolyn Stare wrote:  Pt call to say  NP Uvaldo Rising  wanted her to call with diabetes number      Today number [phone]  >> Dec 23, 2017  9:49 AM Boyd Kerbs wrote:  Her A1C sugar is 287 for today.   Asking for call back regarding machine, having trouble with it.    Pt is asking to call in prescription for # [id number] contour strip tests.   Routed this to office for the refill too. Please send in prescription to:  [place], [place] - [place]  [place] [address]  Phone: [phone] Fax: [phone]    >> Dec 23, 2017 10:29 AM Hollie Salk, RMA wrote: Called left message for patient to please call office back. Raquel Sarna please advise on refill >> Dec 23, 2017 10:45 AM Hollie Salk, RMA wrote: If this patient call back. Please put her through to office Bonnita Nasuti)

## 2017-12-23 NOTE — Telephone Encounter (Signed)
Documentation below reviewed.  New Rx for strips is sent to pharmacy.  Our office has attempted to contact Ms. Cale multiple times over the last several days, however one number is disconnected, and we have left voicemails on a second number listed under demographics with no response.  For [facility]:  **PLEASE OBTAIN ACCURATE TELEPHONE NUMBER** at which we may reach Ms. Crowl.  **PLEASE [place] TO OFFICE** if she calls back so that we may advise her on next steps.    If she does call back after hours, please instruct her to increase Xultophy by 2 units (To 18 Units daily) and call back Monday 12/27/17 with fasting glucose level. ___________________  Copied from [place] [phone]. Topic: General - Other >> Dec 21, 2017  9:07 AM Carolyn Stare wrote:  Pt call to say  NP Uvaldo Rising  wanted her to call with diabetes number      Today number [phone]  >> Dec 23, 2017  9:49 AM Boyd Kerbs wrote:  Her A1C sugar is 287 for today.   Asking for call back regarding machine, having trouble with it.    Pt is asking to call in prescription for # [id number] contour strip tests.   Routed this to office for the refill too. Please send in prescription to:  [place], [place] - [place]  [place] [address]  Phone: [phone] Fax: [phone]    >> Dec 23, 2017 10:29 AM Hollie Salk, RMA wrote: Called left message for patient to please call office back. Raquel Sarna please advise on refill >> Dec 23, 2017 10:45 AM Hollie Salk, RMA wrote: If this patient call back. Please put her through to office Bonnita Nasuti)

## 2017-12-24 NOTE — Telephone Encounter (Signed)
Left message for patient to call.

## 2017-12-31 ENCOUNTER — Ambulatory Visit: Payer: Medicaid Other | Admitting: Family Medicine

## 2017-12-31 ENCOUNTER — Telehealth: Payer: Self-pay

## 2017-12-31 ENCOUNTER — Other Ambulatory Visit: Payer: Self-pay | Admitting: Family Medicine

## 2017-12-31 ENCOUNTER — Telehealth: Payer: Self-pay | Admitting: Family Medicine

## 2017-12-31 DIAGNOSIS — E1142 Type 2 diabetes mellitus with diabetic polyneuropathy: Secondary | ICD-10-CM

## 2017-12-31 DIAGNOSIS — E1165 Type 2 diabetes mellitus with hyperglycemia: Secondary | ICD-10-CM

## 2017-12-31 DIAGNOSIS — IMO0002 Reserved for concepts with insufficient information to code with codable children: Secondary | ICD-10-CM

## 2017-12-31 DIAGNOSIS — E1129 Type 2 diabetes mellitus with other diabetic kidney complication: Secondary | ICD-10-CM

## 2017-12-31 DIAGNOSIS — J4520 Mild intermittent asthma, uncomplicated: Secondary | ICD-10-CM

## 2017-12-31 DIAGNOSIS — E785 Hyperlipidemia, unspecified: Secondary | ICD-10-CM

## 2017-12-31 DIAGNOSIS — R809 Proteinuria, unspecified: Secondary | ICD-10-CM

## 2017-12-31 DIAGNOSIS — E1169 Type 2 diabetes mellitus with other specified complication: Secondary | ICD-10-CM

## 2017-12-31 MED ORDER — ALBUTEROL SULFATE HFA 108 (90 BASE) MCG/ACT IN AERS: INHALATION_SPRAY | RESPIRATORY_TRACT | 0 refills | Status: DC

## 2017-12-31 MED ORDER — ALBUTEROL SULFATE HFA 108 (90 BASE) MCG/ACT IN AERS: 2.0000 | INHALATION_SPRAY | Freq: Four times a day (QID) | RESPIRATORY_TRACT | 0 refills | Status: DC | PRN

## 2017-12-31 NOTE — Telephone Encounter (Signed)
Copied from [place] [phone]. Topic: General - Other >> Dec 31, 2017  9:55 AM Lolita Rieger, RMA wrote: Reason for CRM: pt stated that she never received 2 medications that were sent on 12/17/17 xultophy or her albuterol inhaler and she would like for them to be resent to [facility] on [address]

## 2017-12-31 NOTE — Telephone Encounter (Signed)
See other telephone encounter note from 12/31/17.

## 2017-12-31 NOTE — Telephone Encounter (Signed)
Refill request for general medication: ProAir Inhaler requested. Medicaid will not pay for Ventolin HFA.  Last office visit: 12/17/2017  Last physical exam: None indicated  Follow-up on file. 01/07/2018

## 2017-12-31 NOTE — Telephone Encounter (Signed)
Pt was supposed to be calling our office back to discuss titration of Xultophy - CRM was created clearly stating the need to update her contact information and to send her call through to [facility] so that we may speak directly to her.    - Please call patient and advise that I will refill her albuterol.   - It looks like Xultophy was sent in on 12/17/17 - please call pharmacy to verify. **We need to know her BG's so that we can help her titrate.** - Remind her of her appointment with Dr. Ancil Boozer on 01/07/18

## 2017-12-31 NOTE — Telephone Encounter (Signed)
Patient stated she is taking 24 units of Xultophy, BS running 250 today

## 2017-12-31 NOTE — Telephone Encounter (Signed)
Patient notified

## 2017-12-31 NOTE — Telephone Encounter (Signed)
Called left message for patient to call office

## 2017-12-31 NOTE — Telephone Encounter (Signed)
BS sent today was 250 and yesterday it was 259 on 24 units of Xultophy. Dhe stated she went up due to insert stating she can go up every 2 days

## 2017-12-31 NOTE — Telephone Encounter (Addendum)
Continue to increase by 2 units every 2-3 days until fasting glucose is <180.  Once <180, remain at that current dose daily.   If she experiences any hypoglycemic episodes - feeling sweaty, nervous, nauseous, or shaky, she needs to check BG, eat a small snack, recheck BG in 30-45 minutes. If not improving, she should seek emergency care.  Follow up is scheduled for January 07, 2017 with Dr. Ancil Boozer.

## 2018-01-03 ENCOUNTER — Other Ambulatory Visit: Payer: Self-pay | Admitting: Family Medicine

## 2018-01-03 DIAGNOSIS — R809 Proteinuria, unspecified: Principal | ICD-10-CM

## 2018-01-03 DIAGNOSIS — IMO0002 Reserved for concepts with insufficient information to code with codable children: Secondary | ICD-10-CM

## 2018-01-03 DIAGNOSIS — E1165 Type 2 diabetes mellitus with hyperglycemia: Principal | ICD-10-CM

## 2018-01-03 DIAGNOSIS — E1129 Type 2 diabetes mellitus with other diabetic kidney complication: Secondary | ICD-10-CM

## 2018-01-03 MED ORDER — GLUCOSE BLOOD VI STRP: ORAL_STRIP | 2 refills | Status: DC

## 2018-01-03 NOTE — Telephone Encounter (Signed)
Copied from [place] [id number]. Topic: Quick Communication - See Telephone Encounter >> Jan 03, 2018 12:06 PM Ether Griffins B wrote: CRM for notification. See Telephone encounter for:  Pt is needing diabetic test strips called in. The needles where called in and she has those she's in need of the strips.  01/03/18.

## 2018-01-06 ENCOUNTER — Telehealth: Payer: Self-pay | Admitting: Family Medicine

## 2018-01-06 NOTE — Telephone Encounter (Signed)
Please call patient and remind her to come in for fasting labs.

## 2018-01-07 ENCOUNTER — Ambulatory Visit: Payer: Medicaid Other | Admitting: Family Medicine

## 2018-01-20 ENCOUNTER — Other Ambulatory Visit: Payer: Self-pay | Admitting: Family Medicine

## 2018-01-20 DIAGNOSIS — G8929 Other chronic pain: Secondary | ICD-10-CM

## 2018-01-20 DIAGNOSIS — M5440 Lumbago with sciatica, unspecified side: Principal | ICD-10-CM

## 2018-01-20 DIAGNOSIS — E1142 Type 2 diabetes mellitus with diabetic polyneuropathy: Secondary | ICD-10-CM

## 2018-01-20 DIAGNOSIS — M4807 Spinal stenosis, lumbosacral region: Secondary | ICD-10-CM

## 2018-01-20 MED ORDER — GABAPENTIN 300 MG PO CAPS: 600.0000 mg | ORAL_CAPSULE | Freq: Two times a day (BID) | ORAL | 1 refills | Status: DC

## 2018-01-20 NOTE — Telephone Encounter (Signed)
Copied from [place] [phone]. Topic: Quick Communication - Rx Refill/Question >> Jan 20, 2018 11:18 AM Aurelio Brash B wrote: Medication:gabapentin (NEURONTIN) 300 MG capsule  and Insulin Pen Needle (NOVOFINE) 32G X 6 MM MISC   Has the patient contacted their pharmacy? No    (Agent: If no, request that the patient contact the pharmacy for the refill.)   Preferred Pharmacy (with phone number or street name): [facility] [address], [place] - [place] [phone] (Phone) [phone] (Fax)    Agent: Please be advised that RX refills may take up to 3 business days. We ask that you follow-up with your pharmacy.

## 2018-01-24 NOTE — Telephone Encounter (Signed)
Left message for patient to come by office to do labs

## 2018-03-11 ENCOUNTER — Other Ambulatory Visit: Payer: Self-pay | Admitting: Family Medicine

## 2018-03-11 NOTE — Telephone Encounter (Addendum)
Refill request for general medication: Diclofenac Sodium 1%  Last office visit: 12/17/2017  Last physical exam: None indicated  Follow-ups on file. None indicated

## 2018-03-24 ENCOUNTER — Encounter: Payer: Self-pay | Admitting: Nurse Practitioner

## 2018-03-24 ENCOUNTER — Ambulatory Visit (INDEPENDENT_AMBULATORY_CARE_PROVIDER_SITE_OTHER): Payer: Medicaid Other | Admitting: Nurse Practitioner

## 2018-03-24 VITALS — BP 124/76 | HR 100 | Temp 98.4°F | Resp 16 | Ht 67.0 in | Wt 195.2 lb

## 2018-03-24 DIAGNOSIS — E1169 Type 2 diabetes mellitus with other specified complication: Secondary | ICD-10-CM | POA: Diagnosis not present

## 2018-03-24 DIAGNOSIS — E1142 Type 2 diabetes mellitus with diabetic polyneuropathy: Secondary | ICD-10-CM

## 2018-03-24 DIAGNOSIS — E1165 Type 2 diabetes mellitus with hyperglycemia: Secondary | ICD-10-CM

## 2018-03-24 DIAGNOSIS — M5440 Lumbago with sciatica, unspecified side: Secondary | ICD-10-CM

## 2018-03-24 DIAGNOSIS — M4807 Spinal stenosis, lumbosacral region: Secondary | ICD-10-CM

## 2018-03-24 DIAGNOSIS — E785 Hyperlipidemia, unspecified: Secondary | ICD-10-CM

## 2018-03-24 DIAGNOSIS — I10 Essential (primary) hypertension: Secondary | ICD-10-CM

## 2018-03-24 DIAGNOSIS — F439 Reaction to severe stress, unspecified: Secondary | ICD-10-CM

## 2018-03-24 DIAGNOSIS — G8929 Other chronic pain: Secondary | ICD-10-CM | POA: Diagnosis not present

## 2018-03-24 DIAGNOSIS — E1129 Type 2 diabetes mellitus with other diabetic kidney complication: Secondary | ICD-10-CM

## 2018-03-24 DIAGNOSIS — M170 Bilateral primary osteoarthritis of knee: Secondary | ICD-10-CM

## 2018-03-24 DIAGNOSIS — IMO0002 Reserved for concepts with insufficient information to code with codable children: Secondary | ICD-10-CM

## 2018-03-24 DIAGNOSIS — R809 Proteinuria, unspecified: Secondary | ICD-10-CM

## 2018-03-24 DIAGNOSIS — F17200 Nicotine dependence, unspecified, uncomplicated: Secondary | ICD-10-CM

## 2018-03-24 MED ORDER — VOLTAREN 1 % TD GEL: TRANSDERMAL | 0 refills | Status: DC

## 2018-03-24 MED ORDER — BAYER MICROLET LANCETS MISC: 12 refills | Status: DC

## 2018-03-24 MED ORDER — INSULIN DEGLUDEC-LIRAGLUTIDE 100-3.6 UNIT-MG/ML ~~LOC~~ SOPN: 16.0000 [IU] | PEN_INJECTOR | Freq: Every day | SUBCUTANEOUS | 2 refills | Status: DC

## 2018-03-24 MED ORDER — GLUCOSE BLOOD VI STRP: ORAL_STRIP | 2 refills | Status: DC

## 2018-03-24 MED ORDER — GABAPENTIN 300 MG PO CAPS: 600.0000 mg | ORAL_CAPSULE | Freq: Two times a day (BID) | ORAL | 1 refills | Status: DC

## 2018-03-24 MED ORDER — INSULIN PEN NEEDLE 32G X 6 MM MISC: 1.0000 | Freq: Every day | 1 refills | Status: DC

## 2018-03-24 NOTE — Patient Instructions (Addendum)
- continue taking medications as they are prescribed - We will updated your medications based off of your lab work - Try to identify friend/community members to help talk too -Follow up in 2 weeks to discuss stress and pain  - You are doing a great job with all that you have going on and you are strong. Remember to draw on the support around you when you need it.   DASH Eating Plan DASH stands for "Dietary Approaches to Stop Hypertension." The DASH eating plan is a healthy eating plan that has been shown to reduce high blood pressure (hypertension). It may also reduce your risk for type 2 diabetes, heart disease, and stroke. The DASH eating plan may also help with weight loss. What are tips for following this plan? General guidelines  Avoid eating more than 2,300 mg (milligrams) of salt (sodium) a day. If you have hypertension, you may need to reduce your sodium intake to 1,500 mg a day.  Limit alcohol intake to no more than 1 drink a day for nonpregnant women and 2 drinks a day for men. One drink equals 12 oz of beer, 5 oz of wine, or 1 oz of hard liquor.  Work with your health care provider to maintain a healthy body weight or to lose weight. Ask what an ideal weight is for you.  Get at least 30 minutes of exercise that causes your heart to beat faster (aerobic exercise) most days of the week. Activities may include walking, swimming, or biking.  Work with your health care provider or diet and nutrition specialist (dietitian) to adjust your eating plan to your individual calorie needs. Reading food labels  Check food labels for the amount of sodium per serving. Choose foods with less than 5 percent of the Daily Value of sodium. Generally, foods with less than 300 mg of sodium per serving fit into this eating plan.  To find whole grains, look for the word "whole" as the first word in the ingredient list. Shopping  Buy products labeled as "low-sodium" or "no salt added."  Buy fresh  foods. Avoid canned foods and premade or frozen meals. Cooking  Avoid adding salt when cooking. Use salt-free seasonings or herbs instead of table salt or sea salt. Check with your health care provider or pharmacist before using salt substitutes.  Do not fry foods. Cook foods using healthy methods such as baking, boiling, grilling, and broiling instead.  Cook with heart-healthy oils, such as olive, canola, soybean, or sunflower oil. Meal planning   Eat a balanced diet that includes: ? 5 or more servings of fruits and vegetables each day. At each meal, try to fill half of your plate with fruits and vegetables. ? Up to 6-8 servings of whole grains each day. ? Less than 6 oz of lean meat, poultry, or fish each day. A 3-oz serving of meat is about the same size as a deck of cards. One egg equals 1 oz. ? 2 servings of low-fat dairy each day. ? A serving of nuts, seeds, or beans 5 times each week. ? Heart-healthy fats. Healthy fats called Omega-3 fatty acids are found in foods such as flaxseeds and coldwater fish, like sardines, salmon, and mackerel.  Limit how much you eat of the following: ? Canned or prepackaged foods. ? Food that is high in trans fat, such as fried foods. ? Food that is high in saturated fat, such as fatty meat. ? Sweets, desserts, sugary drinks, and other foods with added sugar. ?  Full-fat dairy products.  Do not salt foods before eating.  Try to eat at least 2 vegetarian meals each week.  Eat more home-cooked food and less restaurant, buffet, and fast food.  When eating at a restaurant, ask that your food be prepared with less salt or no salt, if possible. What foods are recommended? The items listed may not be a complete list. Talk with your dietitian about what dietary choices are best for you. Grains Whole-grain or whole-wheat bread. Whole-grain or whole-wheat pasta. Brown rice. [place]. Bulgur. Whole-grain and low-sodium cereals. Pita bread. Low-fat,  low-sodium crackers. Whole-wheat flour tortillas. Vegetables Fresh or frozen vegetables (raw, steamed, roasted, or grilled). Low-sodium or reduced-sodium tomato and vegetable juice. Low-sodium or reduced-sodium tomato sauce and tomato paste. Low-sodium or reduced-sodium canned vegetables. Fruits All fresh, dried, or frozen fruit. Canned fruit in natural juice (without added sugar). Meat and other protein foods Skinless chicken or [place]. Ground chicken or [place]. Pork with fat trimmed off. Fish and seafood. Egg whites. Dried beans, peas, or lentils. Unsalted nuts, nut butters, and seeds. Unsalted canned beans. Lean cuts of beef with fat trimmed off. Low-sodium, lean deli meat. Dairy Low-fat (1%) or fat-free (skim) milk. Fat-free, low-fat, or reduced-fat cheeses. Nonfat, low-sodium ricotta or cottage cheese. Low-fat or nonfat yogurt. Low-fat, low-sodium cheese. Fats and oils Soft margarine without trans fats. Vegetable oil. Low-fat, reduced-fat, or light mayonnaise and salad dressings (reduced-sodium). Canola, safflower, olive, soybean, and sunflower oils. Avocado. Seasoning and other foods Herbs. Spices. Seasoning mixes without salt. Unsalted popcorn and pretzels. Fat-free sweets. What foods are not recommended? The items listed may not be a complete list. Talk with your dietitian about what dietary choices are best for you. Grains Baked goods made with fat, such as croissants, muffins, or some breads. Dry pasta or rice meal packs. Vegetables Creamed or fried vegetables. Vegetables in a cheese sauce. Regular canned vegetables (not low-sodium or reduced-sodium). Regular canned tomato sauce and paste (not low-sodium or reduced-sodium). Regular tomato and vegetable juice (not low-sodium or reduced-sodium). Angie Fava. Olives. Fruits Canned fruit in a light or heavy syrup. Fried fruit. Fruit in cream or butter sauce. Meat and other protein foods Fatty cuts of meat. Ribs. Fried meat. Berniece Salines. Sausage.  Bologna and other processed lunch meats. Salami. Fatback. Hotdogs. Bratwurst. Salted nuts and seeds. Canned beans with added salt. Canned or smoked fish. Whole eggs or egg yolks. Chicken or [place] with skin. Dairy Whole or 2% milk, cream, and half-and-half. Whole or full-fat cream cheese. Whole-fat or sweetened yogurt. Full-fat cheese. Nondairy creamers. Whipped toppings. Processed cheese and cheese spreads. Fats and oils Butter. Stick margarine. Lard. Shortening. Ghee. Bacon fat. Tropical oils, such as coconut, palm kernel, or palm oil. Seasoning and other foods Salted popcorn and pretzels. Onion salt, garlic salt, seasoned salt, table salt, and sea salt. Worcestershire sauce. Tartar sauce. Barbecue sauce. Teriyaki sauce. Soy sauce, including reduced-sodium. Steak sauce. Canned and packaged gravies. Fish sauce. Oyster sauce. Cocktail sauce. Horseradish that you find on the shelf. Ketchup. Mustard. Meat flavorings and tenderizers. Bouillon cubes. Hot sauce and Tabasco sauce. Premade or packaged marinades. Premade or packaged taco seasonings. Relishes. Regular salad dressings. Where to find more information:  National Heart, Lung, and [place]: https://wilson-eaton.com/  American Heart Association: www.heart.org Summary  The DASH eating plan is a healthy eating plan that has been shown to reduce high blood pressure (hypertension). It may also reduce your risk for type 2 diabetes, heart disease, and stroke.  With the DASH eating plan, you  should limit salt (sodium) intake to 2,300 mg a day. If you have hypertension, you may need to reduce your sodium intake to 1,500 mg a day.  When on the DASH eating plan, aim to eat more fresh fruits and vegetables, whole grains, lean proteins, low-fat dairy, and heart-healthy fats.  Work with your health care provider or diet and nutrition specialist (dietitian) to adjust your eating plan to your individual calorie needs. This information is not intended to  replace advice given to you by your health care provider. Make sure you discuss any questions you have with your health care provider. Document Released: 11/12/2011 Document Revised: 11/16/2016 Document Reviewed: 11/16/2016 Elsevier Interactive Patient Education  [employer].

## 2018-03-24 NOTE — Progress Notes (Addendum)
Name: Janice Short   MRN: [id number]    DOB: 12-12-1960   Date:03/24/2018       Progress Note  Subjective  Chief Complaint  Chief Complaint  Patient presents with  . Medication Refill    HPI Diabetes Ate this morning, a small sausage, yogurt, pineapple juice and coffee. Doesn't check blood sugars cause machine was broke but states that it got fixed. Denies polyphagia, polydipsia, polyuria.   HLD Takes atorvastatin nightly  HTN  Patient takes meds daily. Denies headaches, cp, blurry vision. Doesn't check bps at home. Avoids pork and grease. sts usually well-controlled   Stress Son wrecked both cars, has transportation issues; struggling paying bills and feels like everything is piling on. Patient disabled due to lumbar disc pain and using social security. States feels a lack of family support. Feels family unwilling to help and assist.   Pain Patient has joint aches from OA and burning tingling pain in lowe back down bilateral legs. sts acetaminophen, voltaren and gabapentin no longer help pain like they used too. Pt sts does not want consult went to pain management in the past and had very bad experience with injections, pt tearful.   Smokes "every blue moon" Patient Active Problem List   Diagnosis Date Noted  . Dyslipidemia associated with type 2 diabetes mellitus ([place]) 12/17/2017  . Noncompliance with medication regimen 12/17/2017  . Spinal stenosis 10/15/2016  . Diabetic polyneuropathy associated with type 2 diabetes mellitus ([place]) 07/14/2016  . Chronic midline low back pain with sciatica 06/22/2016  . Primary osteoarthritis of both knees 06/22/2016  . Carpal tunnel syndrome 07/01/2015  . Cervical radiculitis 07/01/2015  . Corn or callus 07/01/2015  . Impingement syndrome of shoulder 07/01/2015  . Microalbuminuria 07/01/2015  . Morbid obesity ([place]) 07/01/2015  . Supraventricular tachycardia ([place]) 07/01/2015  . Compulsive tobacco user syndrome 07/01/2015  . Tenosynovitis  of wrist 07/01/2015  . Benign hypertension 11/05/2009  . Uncontrolled type 2 diabetes mellitus with microalbuminuria ([place]) 11/05/2009  . Athlete's foot 08/05/2009  . Combined fat and carbohydrate induced hyperlipemia 01/08/2009  . Vitamin D deficiency 01/08/2009  . Asthma, mild intermittent 12/28/2008  . Allergic rhinitis 12/28/2008  . Lumbosacral neuritis 08/11/2007    Past Medical History:  Diagnosis Date  . Allergy   . Asthma   . Carpal tunnel syndrome on left   . Cervical radiculitis   . Chronic osteoarthritis   . Corns and callosity   . Dermatophytosis of foot   . Diabetes mellitus without complication ([place])   . Gout   . Hyperlipidemia   . Hypertension   . Impingement syndrome of left shoulder   . Lumbosacral neuritis   . Microalbuminuria   . Obesity   . SVT (supraventricular tachycardia) ([place])   . Tenosynovitis of wrist   . Vitamin D deficiency     Past Surgical History:  Procedure Laterality Date  . ABDOMINAL HYSTERECTOMY      Social History   Tobacco Use  . Smoking status: Current Some Day Smoker  . Smokeless tobacco: Never Used  Substance Use Topics  . Alcohol use: No    Alcohol/week: 0.0 oz     Current Outpatient Medications:  .  acetaminophen (TYLENOL) 500 MG tablet, Take 1 tablet by mouth at bedtime as needed., Disp: , Rfl:  .  albuterol (PROAIR HFA) 108 (90 Base) MCG/ACT inhaler, Inhale 2 puffs into the lungs every 6 (six) hours as needed for wheezing or shortness of breath., Disp: 1 Inhaler, Rfl: 0 .  aspirin 81 MG tablet, Take 1 tablet by mouth daily., Disp: , Rfl:  .  atorvastatin (LIPITOR) 40 MG tablet, Take 1 tablet (40 mg total) by mouth at bedtime., Disp: 90 tablet, Rfl: 0 .  BAYER MICROLET LANCETS lancets, Use as instructed, Disp: 100 each, Rfl: 12 .  empagliflozin (JARDIANCE) 10 MG TABS tablet, Take 10 mg by mouth daily., Disp: 30 tablet, Rfl: 2 .  Empagliflozin-Metformin HCl ER (SYNJARDY XR) 25-1000 MG TB24, Take 1 tablet by mouth daily.,  Disp: 90 tablet, Rfl: 0 .  fluticasone (FLONASE) 50 MCG/ACT nasal spray, Place 2 sprays into both nostrils at bedtime., Disp: 16 g, Rfl: 2 .  gabapentin (NEURONTIN) 300 MG capsule, Take 2 capsules (600 mg total) by mouth 2 (two) times daily., Disp: 120 capsule, Rfl: 1 .  glucose blood (BAYER CONTOUR TEST) test strip, Use as instructed, Disp: 100 each, Rfl: 2 .  Insulin Degludec-Liraglutide (XULTOPHY) 100-3.6 UNIT-MG/ML SOPN, Inject 16-50 Units into the skin daily. Start at 52 and go up by 2 units every 2 days for fasting 90-140, Disp: 9 mL, Rfl: 2 .  Insulin Pen Needle (NOVOFINE) 32G X 6 MM MISC, 1 each by Does not apply route daily., Disp: 100 each, Rfl: 1 .  losartan-hydrochlorothiazide (HYZAAR) 50-12.5 MG tablet, Take 1 tablet by mouth daily., Disp: 90 tablet, Rfl: 3 .  metFORMIN (GLUCOPHAGE XR) 750 MG 24 hr tablet, Take 2 tablets (1,500 mg total) by mouth daily with breakfast., Disp: 60 tablet, Rfl: 0 .  VOLTAREN 1 % GEL, APPLY 4 GRAMS TOPICALLY 4 TIMES DAILY, Disp: 100 g, Rfl: 0  Allergies  Allergen Reactions  . Other Shortness Of Breath    Cat dander and grass pollen  . Bydureon [Exenatide] Other (See Comments)    Pain with injection not allergy    ROS    No other specific complaints in a complete review of systems (except as listed in HPI above).  Objective  Vitals:   03/24/18 1039  BP: 124/76  Pulse: 100  Resp: 16  Temp: 98.4 F (36.9 C)  TempSrc: Oral  SpO2: 98%  Weight: 195 lb 3.2 oz (88.5 kg)  Height: 5\' 7"  (1.702 m)    Body mass index is 30.57 kg/m.  Nursing Note and Vital Signs reviewed.  Physical Exam  Constitutional: Patient appears well-developed and well-nourished. Obese  No distress.  Cardiovascular: Normal rate, regular rhythm, S1/S2 present.  No murmur or rub heard. No carotid bruits, pulses intact  Pulmonary/Chest: Effort normal and breath sounds clear. No respiratory distress or retractions. Abdominal: Soft and non-tender, bowel sounds  present MSK: joint swelling noted in bilateral knees and bilateral hands, steady gait  Psychiatric: Patient anxious and tearful.  Judgment and thought content normal.  No results found for this or any previous visit (from the past 72 hour(s)).  Assessment & Plan  Patient presents to clinic today for refill on insulin, pen, test strips and gabapentin and Voltaren gel.  Patient became very tearful during visit due to increased stress from lack of transportation, increased financial burden, lack of support from family.  Patient feels overwhelmed and states she has increased pain.  Attempted to identify personal/community resources with patient.  States he is unable to do counseling due to lack of transportation, no Internet access.  States is having difficulty caring for herself because she is very busy caring for other family members.  Discussed the importance of self-care.  Patient blood sugar machine now working-discussed importance of checking blood sugars at home.  Discussed diet and medication adherence.  Will get lab work done today patient had labs due from last visit we will obtain those today as well.  Discussed importance of follow-up visit to discuss during stress and pain management plan to see in 2 weeks 1. Uncontrolled type 2 diabetes mellitus with microalbuminuria ([facility]) - HgB A1c - Insulin Pen Needle (NOVOFINE) 32G X 6 MM MISC; 1 each by Does not apply route daily.  Dispense: 100 each; Refill: 1 - Insulin Degludec-Liraglutide (XULTOPHY) 100-3.6 UNIT-MG/ML SOPN; Inject 16-50 Units into the skin daily. Start at 44 and go up by 2 units every 2 days for fasting 90-140  Dispense: 9 mL; Refill: 2 - glucose blood (BAYER CONTOUR TEST) test strip; Use as instructed  Dispense: 100 each; Refill: 2 - BAYER MICROLET LANCETS lancets; Use as instructed  Dispense: 100 each; Refill: 12  2. Dyslipidemia associated with type 2 diabetes mellitus ([place]) -continue statin  - Insulin Degludec-Liraglutide  (XULTOPHY) 100-3.6 UNIT-MG/ML SOPN; Inject 16-50 Units into the skin daily. Start at 36 and go up by 2 units every 2 days for fasting 90-140  Dispense: 9 mL; Refill: 2  3. Diabetic polyneuropathy associated with type 2 diabetes mellitus ([facility]) - Insulin Pen Needle (NOVOFINE) 32G X 6 MM MISC; 1 each by Does not apply route daily.  Dispense: 100 each; Refill: 1 - Insulin Degludec-Liraglutide (XULTOPHY) 100-3.6 UNIT-MG/ML SOPN; Inject 16-50 Units into the skin daily. Start at 70 and go up by 2 units every 2 days for fasting 90-140  Dispense: 9 mL; Refill: 2 - gabapentin (NEURONTIN) 300 MG capsule; Take 2 capsules (600 mg total) by mouth 2 (two) times daily.  Dispense: 120 capsule; Refill: 1  4. Chronic midline low back pain with sciatica, sciatica laterality unspecified - gabapentin (NEURONTIN) 300 MG capsule; Take 2 capsules (600 mg total) by mouth 2 (two) times daily.  Dispense: 120 capsule; Refill: 1  5. Spinal stenosis of lumbosacral region - gabapentin (NEURONTIN) 300 MG capsule; Take 2 capsules (600 mg total) by mouth 2 (two) times daily.  Dispense: 120 capsule; Refill: 1  6. Primary osteoarthritis of both knees - VOLTAREN 1 % GEL; APPLY 4 GRAMS TOPICALLY 4 TIMES DAILY  Dispense: 100 g; Refill: 0  7. Stress at home Patient tearful in office states overwhelmed with lack of transportation and financial burdens and lack of help from family. Started discussion on counseling or identifying friends to ask for help due to lack of transportation for visits.   Face-to-face time with patient was more than 25 minutes, >50% time spent counseling and coordination of care  -Red flags and when to present for emergency care or RTC including fever >101.107F, chest pain, shortness of breath, new/worsening/un-resolving symptoms,  reviewed with patient at time of visit. Follow up and care instructions discussed and provided in AVS.  -------------------------------- I have reviewed this encounter including the  documentation in this note and/or discussed this patient with the provider, Suezanne Cheshire DNP AGNP-C. I am certifying that I agree with the content of this note as supervising physician. Enid Derry, [facility] 04/26/2018, 12:29 PM

## 2018-03-25 ENCOUNTER — Ambulatory Visit: Payer: Medicaid Other | Admitting: Nurse Practitioner

## 2018-03-25 LAB — COMPLETE METABOLIC PANEL WITH GFR
AG Ratio: 1.3 (calc) (ref 1.0–2.5)
ALBUMIN MSPROF: 4.3 g/dL (ref 3.6–5.1)
ALT: 18 U/L (ref 6–29)
AST: 16 U/L (ref 10–35)
Alkaline phosphatase (APISO): 122 U/L (ref 33–130)
BILIRUBIN TOTAL: 0.4 mg/dL (ref 0.2–1.2)
BUN: 12 mg/dL (ref 7–25)
CALCIUM: 9.7 mg/dL (ref 8.6–10.4)
CO2: 27 mmol/L (ref 20–32)
CREATININE: 0.95 mg/dL (ref 0.50–1.05)
Chloride: 102 mmol/L (ref 98–110)
GFR, EST AFRICAN AMERICAN: 76 mL/min/{1.73_m2} (ref >=60)
GFR, EST NON AFRICAN AMERICAN: 66 mL/min/{1.73_m2} (ref >=60)
GLUCOSE: 249 mg/dL — AB (ref 65–139)
Globulin: 3.3 g/dL (calc) (ref 1.9–3.7)
Potassium: 4.1 mmol/L (ref 3.5–5.3)
Sodium: 137 mmol/L (ref 135–146)
TOTAL PROTEIN: 7.6 g/dL (ref 6.1–8.1)

## 2018-03-25 LAB — LIPID PANEL
Cholesterol: 109 mg/dL (ref <200)
HDL: 35 mg/dL — ABNORMAL LOW (ref >50)
LDL Cholesterol (Calc): 51 mg/dL (calc)
Non-HDL Cholesterol (Calc): 74 mg/dL (calc) (ref <130)
TRIGLYCERIDES: 151 mg/dL — AB (ref <150)
Total CHOL/HDL Ratio: 3.1 (calc) (ref <5.0)

## 2018-03-25 LAB — HEMOGLOBIN A1C
Hgb A1c MFr Bld: 8 % of total Hgb — ABNORMAL HIGH (ref <5.7)
Mean Plasma Glucose: 183 (calc)
eAG (mmol/L): 10.1 (calc)

## 2018-03-25 LAB — VITAMIN D 25 HYDROXY (VIT D DEFICIENCY, FRACTURES): Vit D, 25-Hydroxy: 11 ng/mL — ABNORMAL LOW (ref 30–100)

## 2018-04-14 ENCOUNTER — Other Ambulatory Visit: Payer: Self-pay | Admitting: Family Medicine

## 2018-04-14 DIAGNOSIS — E1129 Type 2 diabetes mellitus with other diabetic kidney complication: Secondary | ICD-10-CM

## 2018-04-14 DIAGNOSIS — R809 Proteinuria, unspecified: Principal | ICD-10-CM

## 2018-04-14 DIAGNOSIS — IMO0002 Reserved for concepts with insufficient information to code with codable children: Secondary | ICD-10-CM

## 2018-04-14 DIAGNOSIS — E1165 Type 2 diabetes mellitus with hyperglycemia: Principal | ICD-10-CM

## 2018-04-20 ENCOUNTER — Encounter: Payer: Self-pay | Admitting: Nurse Practitioner

## 2018-04-20 ENCOUNTER — Ambulatory Visit: Payer: Medicaid Other | Admitting: Nurse Practitioner

## 2018-04-20 VITALS — BP 122/84 | HR 100 | Temp 97.8°F | Resp 18 | Ht 67.0 in | Wt 196.3 lb

## 2018-04-20 DIAGNOSIS — E1142 Type 2 diabetes mellitus with diabetic polyneuropathy: Secondary | ICD-10-CM

## 2018-04-20 DIAGNOSIS — E1129 Type 2 diabetes mellitus with other diabetic kidney complication: Secondary | ICD-10-CM

## 2018-04-20 DIAGNOSIS — R809 Proteinuria, unspecified: Secondary | ICD-10-CM | POA: Diagnosis not present

## 2018-04-20 DIAGNOSIS — E1169 Type 2 diabetes mellitus with other specified complication: Secondary | ICD-10-CM

## 2018-04-20 DIAGNOSIS — Z23 Encounter for immunization: Secondary | ICD-10-CM

## 2018-04-20 DIAGNOSIS — E1165 Type 2 diabetes mellitus with hyperglycemia: Secondary | ICD-10-CM

## 2018-04-20 DIAGNOSIS — E785 Hyperlipidemia, unspecified: Secondary | ICD-10-CM

## 2018-04-20 DIAGNOSIS — IMO0002 Reserved for concepts with insufficient information to code with codable children: Secondary | ICD-10-CM

## 2018-04-20 MED ORDER — EMPAGLIFLOZIN-METFORMIN HCL ER 25-1000 MG PO TB24: 1.0000 | ORAL_TABLET | Freq: Every day | ORAL | 0 refills | Status: DC

## 2018-04-20 MED ORDER — ATORVASTATIN CALCIUM 40 MG PO TABS: 40.0000 mg | ORAL_TABLET | Freq: Every day | ORAL | 0 refills | Status: DC

## 2018-04-20 NOTE — Patient Instructions (Addendum)
- keep up the good work- drinking lots of water - Eat at least 1-2 vegetables a day (goal is for 5 fruits and veggies a day).  - Please think about the cologuard- for early detection of colon cancer.    Diabetes Mellitus and Nutrition When you have diabetes (diabetes mellitus), it is very important to have healthy eating habits because your blood sugar (glucose) levels are greatly affected by what you eat and drink. Eating healthy foods in the appropriate amounts, at about the same times every day, can help you:  Control your blood glucose.  Lower your risk of heart disease.  Improve your blood pressure.  Reach or maintain a healthy weight.  Every person with diabetes is different, and each person has different needs for a meal plan. Your health care provider may recommend that you work with a diet and nutrition specialist (dietitian) to make a meal plan that is best for you. Your meal plan may vary depending on factors such as:  The calories you need.  The medicines you take.  Your weight.  Your blood glucose, blood pressure, and cholesterol levels.  Your activity level.  Other health conditions you have, such as heart or kidney disease.  How do carbohydrates affect me? Carbohydrates affect your blood glucose level more than any other type of food. Eating carbohydrates naturally increases the amount of glucose in your blood. Carbohydrate counting is a method for keeping track of how many carbohydrates you eat. Counting carbohydrates is important to keep your blood glucose at a healthy level, especially if you use insulin or take certain oral diabetes medicines. It is important to know how many carbohydrates you can safely have in each meal. This is different for every person. Your dietitian can help you calculate how many carbohydrates you should have at each meal and for snack. Foods that contain carbohydrates include:  Bread, cereal, rice, pasta, and crackers.  Potatoes and  corn.  Peas, beans, and lentils.  Milk and yogurt.  Fruit and juice.  Desserts, such as cakes, cookies, ice cream, and candy.  How does alcohol affect me? Alcohol can cause a sudden decrease in blood glucose (hypoglycemia), especially if you use insulin or take certain oral diabetes medicines. Hypoglycemia can be a life-threatening condition. Symptoms of hypoglycemia (sleepiness, dizziness, and confusion) are similar to symptoms of having too much alcohol. If your health care provider says that alcohol is safe for you, follow these guidelines:  Limit alcohol intake to no more than 1 drink per day for nonpregnant women and 2 drinks per day for men. One drink equals 12 oz of beer, 5 oz of wine, or 1 oz of hard liquor.  Do not drink on an empty stomach.  Keep yourself hydrated with water, diet soda, or unsweetened iced tea.  Keep in mind that regular soda, juice, and other mixers may contain a lot of sugar and must be counted as carbohydrates.  What are tips for following this plan? Reading food labels  Start by checking the serving size on the label. The amount of calories, carbohydrates, fats, and other nutrients listed on the label are based on one serving of the food. Many foods contain more than one serving per package.  Check the total grams (g) of carbohydrates in one serving. You can calculate the number of servings of carbohydrates in one serving by dividing the total carbohydrates by 15. For example, if a food has 30 g of total carbohydrates, it would be equal to 2  servings of carbohydrates.  Check the number of grams (g) of saturated and trans fats in one serving. Choose foods that have low or no amount of these fats.  Check the number of milligrams (mg) of sodium in one serving. Most people should limit total sodium intake to less than 2,300 mg per day.  Always check the nutrition information of foods labeled as "low-fat" or "nonfat". These foods may be higher in added  sugar or refined carbohydrates and should be avoided.  Talk to your dietitian to identify your daily goals for nutrients listed on the label. Shopping  Avoid buying canned, premade, or processed foods. These foods tend to be high in fat, sodium, and added sugar.  Shop around the outside edge of the grocery store. This includes fresh fruits and vegetables, bulk grains, fresh meats, and fresh dairy. Cooking  Use low-heat cooking methods, such as baking, instead of high-heat cooking methods like deep frying.  Cook using healthy oils, such as olive, canola, or sunflower oil.  Avoid cooking with butter, cream, or high-fat meats. Meal planning  Eat meals and snacks regularly, preferably at the same times every day. Avoid going long periods of time without eating.  Eat foods high in fiber, such as fresh fruits, vegetables, beans, and whole grains. Talk to your dietitian about how many servings of carbohydrates you can eat at each meal.  Eat 4-6 ounces of lean protein each day, such as lean meat, chicken, fish, eggs, or tofu. 1 ounce is equal to 1 ounce of meat, chicken, or fish, 1 egg, or 1/4 cup of tofu.  Eat some foods each day that contain healthy fats, such as avocado, nuts, seeds, and fish. Lifestyle   Check your blood glucose regularly.  Exercise at least 30 minutes 5 or more days each week, or as told by your health care provider.  Take medicines as told by your health care provider.  Do not use any products that contain nicotine or tobacco, such as cigarettes and e-cigarettes. If you need help quitting, ask your health care provider.  Work with a Social worker or diabetes educator to identify strategies to manage stress and any emotional and social challenges. What are some questions to ask my health care provider?  Do I need to meet with a diabetes educator?  Do I need to meet with a dietitian?  What number can I call if I have questions?  When are the best times to check my  blood glucose? Where to find more information:  American Diabetes Association: diabetes.org/food-and-fitness/food  Academy of Nutrition and Dietetics: PokerClues.dk  [employer] of Diabetes and Digestive and Kidney Diseases (NIH): ContactWire.be Summary  A healthy meal plan will help you control your blood glucose and maintain a healthy lifestyle.  Working with a diet and nutrition specialist (dietitian) can help you make a meal plan that is best for you.  Keep in mind that carbohydrates and alcohol have immediate effects on your blood glucose levels. It is important to count carbohydrates and to use alcohol carefully. This information is not intended to replace advice given to you by your health care provider. Make sure you discuss any questions you have with your health care provider. Document Released: 08/20/2005 Document Revised: 12/28/2016 Document Reviewed: 12/28/2016 Elsevier Interactive Patient Education  [employer].

## 2018-04-20 NOTE — Progress Notes (Addendum)
Name: Janice Short   MRN: [id number]    DOB: 08/02/1961   Date:04/20/2018       Progress Note  Subjective  Chief Complaint  Chief Complaint  Patient presents with  . Follow-up    HPI  Diabetes Rx synjardy 25-1000 once daily and xultophy taking 24 units a day. States finished sample and was having issue with pharmacy due to prior authorization. Patient states pharmacy hasn't given her glucose strips to check her sugar that was ordered last visit. Patient eats a lot of yogurt and breakfast bar and water, eating eggs, apples- doing a lot better with her diet. Patient states keep a water jug beside her bed- drinking plenty of it.  Takes losartan for nephroprotection and HTN  Patient denies polydipsia, polyuria, polyphagia, headache, diziness   Lab Results  Component Value Date   HGBA1C 8.0 (H) 03/24/2018   Hyperlipidemia Patient rx atorvastatinTakes medications as prescribed with no missed doses a month.  Diet: 4-5 vegetable a week.  Denies myalgias    Chronic pain Rx gabapentin, Voltaren gel- states her pain is better controlled- states the medication had ran out previously and was in so much pain. Patient states has increased mobility due to decreased pain.   Patient Active Problem List   Diagnosis Date Noted  . Dyslipidemia associated with type 2 diabetes mellitus ([place]) 12/17/2017  . Noncompliance with medication regimen 12/17/2017  . Spinal stenosis 10/15/2016  . Diabetic polyneuropathy associated with type 2 diabetes mellitus ([place]) 07/14/2016  . Chronic midline low back pain with sciatica 06/22/2016  . Primary osteoarthritis of both knees 06/22/2016  . Carpal tunnel syndrome 07/01/2015  . Cervical radiculitis 07/01/2015  . Corn or callus 07/01/2015  . Impingement syndrome of shoulder 07/01/2015  . Microalbuminuria 07/01/2015  . Morbid obesity ([place]) 07/01/2015  . Supraventricular tachycardia ([place]) 07/01/2015  . Compulsive tobacco user syndrome 07/01/2015  . Tenosynovitis  of wrist 07/01/2015  . Benign hypertension 11/05/2009  . Uncontrolled type 2 diabetes mellitus with microalbuminuria ([place]) 11/05/2009  . Athlete's foot 08/05/2009  . Combined fat and carbohydrate induced hyperlipemia 01/08/2009  . Vitamin D deficiency 01/08/2009  . Asthma, mild intermittent 12/28/2008  . Allergic rhinitis 12/28/2008  . Lumbosacral neuritis 08/11/2007    Past Medical History:  Diagnosis Date  . Allergy   . Asthma   . Carpal tunnel syndrome on left   . Cervical radiculitis   . Chronic osteoarthritis   . Corns and callosity   . Dermatophytosis of foot   . Diabetes mellitus without complication ([place])   . Gout   . Hyperlipidemia   . Hypertension   . Impingement syndrome of left shoulder   . Lumbosacral neuritis   . Microalbuminuria   . Obesity   . SVT (supraventricular tachycardia) ([place])   . Tenosynovitis of wrist   . Vitamin D deficiency     Past Surgical History:  Procedure Laterality Date  . ABDOMINAL HYSTERECTOMY      Social History   Tobacco Use  . Smoking status: Current Some Day Smoker  . Smokeless tobacco: Never Used  Substance Use Topics  . Alcohol use: No    Alcohol/week: 0.0 oz     Current Outpatient Medications:  .  acetaminophen (TYLENOL) 500 MG tablet, Take 1 tablet by mouth at bedtime as needed., Disp: , Rfl:  .  albuterol (PROAIR HFA) 108 (90 Base) MCG/ACT inhaler, Inhale 2 puffs into the lungs every 6 (six) hours as needed for wheezing or shortness of breath., Disp: 1  Inhaler, Rfl: 0 .  aspirin 81 MG tablet, Take 1 tablet by mouth daily., Disp: , Rfl:  .  atorvastatin (LIPITOR) 40 MG tablet, Take 1 tablet (40 mg total) by mouth at bedtime., Disp: 90 tablet, Rfl: 0 .  BAYER MICROLET LANCETS lancets, Use as instructed, Disp: 100 each, Rfl: 12 .  Empagliflozin-Metformin HCl ER (SYNJARDY XR) 25-1000 MG TB24, Take 1 tablet by mouth daily., Disp: 90 tablet, Rfl: 0 .  fluticasone (FLONASE) 50 MCG/ACT nasal spray, Place 2 sprays into both  nostrils at bedtime., Disp: 16 g, Rfl: 2 .  gabapentin (NEURONTIN) 300 MG capsule, Take 2 capsules (600 mg total) by mouth 2 (two) times daily., Disp: 120 capsule, Rfl: 1 .  glucose blood (BAYER CONTOUR TEST) test strip, Use as instructed, Disp: 100 each, Rfl: 2 .  Insulin Degludec-Liraglutide (XULTOPHY) 100-3.6 UNIT-MG/ML SOPN, Inject 16-50 Units into the skin daily. Start at 71 and go up by 2 units every 2 days for fasting 90-140, Disp: 9 mL, Rfl: 2 .  Insulin Pen Needle (NOVOFINE) 32G X 6 MM MISC, 1 each by Does not apply route daily., Disp: 100 each, Rfl: 1 .  losartan-hydrochlorothiazide (HYZAAR) 50-12.5 MG tablet, TAKE 1 TABLET BY MOUTH ONCE DAILY, Disp: 30 tablet, Rfl: 2 .  metFORMIN (GLUCOPHAGE XR) 750 MG 24 hr tablet, Take 2 tablets (1,500 mg total) by mouth daily with breakfast., Disp: 60 tablet, Rfl: 0 .  VOLTAREN 1 % GEL, APPLY 4 GRAMS TOPICALLY 4 TIMES DAILY, Disp: 100 g, Rfl: 0  Allergies  Allergen Reactions  . Other Shortness Of Breath    Cat dander and grass pollen  . Bydureon [Exenatide] Other (See Comments)    Pain with injection not allergy    ROS  No other specific complaints in a complete review of systems (except as listed in HPI above).  Objective  Vitals:   04/20/18 1059  BP: 122/84  Pulse: 100  Resp: 18  Temp: 97.8 F (36.6 C)  TempSrc: Oral  SpO2: 96%  Weight: 196 lb 4.8 oz (89 kg)  Height: 5\' 7"  (1.702 m)     Body mass index is 30.74 kg/m.  Nursing Note and Vital Signs reviewed.  Physical Exam  Constitutional: Patient appears well-developed and well-nourished. Obese  No distress.  Cardiovascular: Normal rate, regular rhythm, S1/S2 present.  No murmur or rub heard.  Pulmonary/Chest: Effort normal and breath sounds clear. No respiratory distress or retractions. Abdominal: Soft and non-tender, bowel sounds present  Psychiatric: Patient has a normal mood and affect. behavior is normal. Judgment and thought content normal.  No results found for  this or any previous visit (from the past 72 hour(s)).  Assessment & Plan  1. Dyslipidemia associated with type 2 diabetes mellitus ([place]) - discussed diet  - atorvastatin (LIPITOR) 40 MG tablet; Take 1 tablet (40 mg total) by mouth at bedtime.  Dispense: 90 tablet; Refill: 0  2. Uncontrolled type 2 diabetes mellitus with microalbuminuria ([facility]) - given another sample of xultophy, discussed diet - Empagliflozin-metFORMIN HCl ER (SYNJARDY XR) 25-1000 MG TB24; Take 1 tablet by mouth daily.  Dispense: 90 tablet; Refill: 0  3. Diabetic polyneuropathy associated with type 2 diabetes mellitus ([place]) - given another sample of xultophy  - Empagliflozin-metFORMIN HCl ER (SYNJARDY XR) 25-1000 MG TB24; Take 1 tablet by mouth daily.  Dispense: 90 tablet; Refill: 0  4. Need for pneumococcal vaccination - Pneumococcal conjugate vaccine 13-valent  Patient mood and motivation have significantly improved since last visit- she attributes to  her pain being under better control as she was out of her gabapentin and has been taking care of herself more and eating better. Encouraged patient to continue drinking lots of water, and increasing her vegetable intake. Pt was initially resistant to pneumovax but willing after discussion. States she is not ready to address other health maintenance issues at this time but will consider it and discuss at follow-up.   -Red flags and when to present for emergency care or RTC including fever >101.26F, chest pain, shortness of breath, new/worsening/un-resolving symptoms,  reviewed with patient at time of visit. Follow up and care instructions discussed and provided in AVS. -Reviewed Health Maintenance:   ----------------------------------------------- I have reviewed this encounter including the documentation in this note and/or discussed this patient with the provider, Suezanne Cheshire DNP AGNP-C. I am certifying that I agree with the content of this note as supervising  physician. Enid Derry, [facility] 05/04/2018, 5:01 PM

## 2018-05-31 ENCOUNTER — Other Ambulatory Visit: Payer: Self-pay | Admitting: Family Medicine

## 2018-05-31 DIAGNOSIS — E785 Hyperlipidemia, unspecified: Secondary | ICD-10-CM

## 2018-05-31 DIAGNOSIS — IMO0002 Reserved for concepts with insufficient information to code with codable children: Secondary | ICD-10-CM

## 2018-05-31 DIAGNOSIS — E1165 Type 2 diabetes mellitus with hyperglycemia: Principal | ICD-10-CM

## 2018-05-31 DIAGNOSIS — E1169 Type 2 diabetes mellitus with other specified complication: Secondary | ICD-10-CM

## 2018-05-31 DIAGNOSIS — E1129 Type 2 diabetes mellitus with other diabetic kidney complication: Secondary | ICD-10-CM

## 2018-05-31 DIAGNOSIS — E1142 Type 2 diabetes mellitus with diabetic polyneuropathy: Secondary | ICD-10-CM

## 2018-05-31 DIAGNOSIS — R809 Proteinuria, unspecified: Principal | ICD-10-CM

## 2018-05-31 MED ORDER — INSULIN PEN NEEDLE 32G X 6 MM MISC: 1.0000 | Freq: Every day | 1 refills | Status: DC

## 2018-05-31 MED ORDER — EMPAGLIFLOZIN-METFORMIN HCL ER 25-1000 MG PO TB24: 1.0000 | ORAL_TABLET | Freq: Every day | ORAL | 0 refills | Status: DC

## 2018-05-31 MED ORDER — INSULIN DEGLUDEC-LIRAGLUTIDE 100-3.6 UNIT-MG/ML ~~LOC~~ SOPN: 16.0000 [IU] | PEN_INJECTOR | Freq: Every day | SUBCUTANEOUS | 2 refills | Status: DC

## 2018-05-31 NOTE — Telephone Encounter (Signed)
Copied from [place] [id number]. Topic: General - Other >> May 31, 2018 10:17 AM Lennox Solders wrote: Reason for CRM: Pt is calling and needs refills on synjardy xr, novofine pen needles and  xutophy sent to [address] in [place]. Pt saw she did call her pharm and they need authorization

## 2018-06-07 ENCOUNTER — Telehealth: Payer: Self-pay | Admitting: Family Medicine

## 2018-06-07 ENCOUNTER — Other Ambulatory Visit: Payer: Self-pay | Admitting: Nurse Practitioner

## 2018-06-07 DIAGNOSIS — IMO0002 Reserved for concepts with insufficient information to code with codable children: Secondary | ICD-10-CM

## 2018-06-07 DIAGNOSIS — M170 Bilateral primary osteoarthritis of knee: Secondary | ICD-10-CM

## 2018-06-07 DIAGNOSIS — E1129 Type 2 diabetes mellitus with other diabetic kidney complication: Secondary | ICD-10-CM

## 2018-06-07 DIAGNOSIS — E1165 Type 2 diabetes mellitus with hyperglycemia: Principal | ICD-10-CM

## 2018-06-07 DIAGNOSIS — R809 Proteinuria, unspecified: Principal | ICD-10-CM

## 2018-06-07 MED ORDER — VOLTAREN 1 % TD GEL: TRANSDERMAL | 0 refills | Status: DC

## 2018-06-07 NOTE — Telephone Encounter (Signed)
Refill for voltaren gel sent. Please let me know if she needs additional orders needles or if she can just get samples thanks

## 2018-06-07 NOTE — Telephone Encounter (Signed)
Please send in refill for Gel

## 2018-06-07 NOTE — Telephone Encounter (Signed)
Copied from [place]. Topic: General - Other >> Jun 07, 2018  8:06 AM Janice Short wrote: Reason for CRM:  patient called and requested a Insulin Pen Needle (NOVOFINE) 32G X 6 MM MISC  sample stated the insurance will not cover until Sept. She stated she can pick those up on Friday, if available.  Patient is also needing her  Voltaren 1% gel refill.  Preferred pharmacy: [facility] [address] [place] [address] Phone: [phone] Fax: [phone]  Please advise.

## 2018-06-21 ENCOUNTER — Ambulatory Visit: Payer: Medicaid Other | Admitting: Family Medicine

## 2018-06-29 NOTE — Telephone Encounter (Addendum)
Relation to pt: self  Call back [phone] Pharmacy: [facility] [place], [place] [phone] (Phone) [phone] (Fax)   Reason for call:  Patient called very upset stating she will run out of her insulin today, patient states she's aware insurance will not cover Insulin Degludec-Liraglutide (XULTOPHY) 100-3.6 UNIT-MG/ML SOPN and states she would like PCP to send in alternate as soon as possible. Patient states if alternate is sent in please send in entire kit, patient requesting to speak with "Bonnita Nasuti" , please advise

## 2018-07-01 ENCOUNTER — Telehealth: Payer: Self-pay

## 2018-07-01 DIAGNOSIS — Z599 Problem related to housing and economic circumstances, unspecified: Secondary | ICD-10-CM

## 2018-07-01 DIAGNOSIS — Z598 Other problems related to housing and economic circumstances: Secondary | ICD-10-CM

## 2018-07-01 NOTE — Telephone Encounter (Signed)
Copied from [place] [id number]. Topic: Quick Communication - See Telephone Encounter >> Jul 01, 2018  2:56 PM Hewitt Shorts wrote: Pt is asking to speak to helen in regards to samples of insulin pens for the patient (she was in a hurry and stated that she just needed to let helen know )-she will not be able to get anything til  September   Best number [phone]   Is it ok to give this patient samples?

## 2018-07-01 NOTE — Telephone Encounter (Signed)
I contacted this patient to inform he that we are willing to give her samples but are currently out of the [place]. I consulted with Dr. Ancil Boozer and she stated that we should put in a C3 referral.  Order has been placed.  I then informed the patient that they will be contacted her to see how they can be of help to her and she said ok.

## 2018-07-01 NOTE — Telephone Encounter (Signed)
yes

## 2018-07-04 NOTE — Telephone Encounter (Signed)
She has not been seen in 3 months; we need to see her to evaluate the best medication management possible.  We cannot change medications without an appointment. Please call to schedule her with myself or Dr. Ancil Boozer tomorrow 07/05/2018.

## 2018-07-04 NOTE — Telephone Encounter (Signed)
Will be here on 07-06-18 at 1pm

## 2018-07-04 NOTE — Telephone Encounter (Signed)
Please refill.

## 2018-07-06 ENCOUNTER — Ambulatory Visit: Payer: Medicaid Other | Admitting: Family Medicine

## 2018-07-06 ENCOUNTER — Encounter: Payer: Self-pay | Admitting: Family Medicine

## 2018-07-06 VITALS — BP 136/62 | HR 98 | Temp 98.2°F | Resp 16 | Ht 67.0 in | Wt 194.6 lb

## 2018-07-06 DIAGNOSIS — R809 Proteinuria, unspecified: Secondary | ICD-10-CM

## 2018-07-06 DIAGNOSIS — E785 Hyperlipidemia, unspecified: Secondary | ICD-10-CM

## 2018-07-06 DIAGNOSIS — G8929 Other chronic pain: Secondary | ICD-10-CM

## 2018-07-06 DIAGNOSIS — E1169 Type 2 diabetes mellitus with other specified complication: Secondary | ICD-10-CM

## 2018-07-06 DIAGNOSIS — E1142 Type 2 diabetes mellitus with diabetic polyneuropathy: Secondary | ICD-10-CM | POA: Diagnosis not present

## 2018-07-06 DIAGNOSIS — J4520 Mild intermittent asthma, uncomplicated: Secondary | ICD-10-CM

## 2018-07-06 DIAGNOSIS — E1165 Type 2 diabetes mellitus with hyperglycemia: Secondary | ICD-10-CM

## 2018-07-06 DIAGNOSIS — M4807 Spinal stenosis, lumbosacral region: Secondary | ICD-10-CM

## 2018-07-06 DIAGNOSIS — F17200 Nicotine dependence, unspecified, uncomplicated: Secondary | ICD-10-CM

## 2018-07-06 DIAGNOSIS — I10 Essential (primary) hypertension: Secondary | ICD-10-CM

## 2018-07-06 DIAGNOSIS — M5440 Lumbago with sciatica, unspecified side: Secondary | ICD-10-CM

## 2018-07-06 DIAGNOSIS — IMO0002 Reserved for concepts with insufficient information to code with codable children: Secondary | ICD-10-CM

## 2018-07-06 DIAGNOSIS — E1129 Type 2 diabetes mellitus with other diabetic kidney complication: Secondary | ICD-10-CM

## 2018-07-06 LAB — POCT GLYCOSYLATED HEMOGLOBIN (HGB A1C): Hemoglobin A1C: 8.9 % — AB (ref 4.0–5.6)

## 2018-07-06 MED ORDER — GABAPENTIN 300 MG PO CAPS: 600.0000 mg | ORAL_CAPSULE | Freq: Two times a day (BID) | ORAL | 1 refills | Status: DC

## 2018-07-06 MED ORDER — INSULIN DEGLUDEC-LIRAGLUTIDE 100-3.6 UNIT-MG/ML ~~LOC~~ SOPN: 16.0000 [IU] | PEN_INJECTOR | Freq: Every day | SUBCUTANEOUS | 2 refills | Status: DC

## 2018-07-06 MED ORDER — LOSARTAN POTASSIUM-HCTZ 50-12.5 MG PO TABS: 1.0000 | ORAL_TABLET | Freq: Every day | ORAL | 1 refills | Status: DC

## 2018-07-06 MED ORDER — MELATONIN 3 MG PO TABS: 3.0000 mg | ORAL_TABLET | Freq: Every day | ORAL | 0 refills | Status: DC

## 2018-07-06 MED ORDER — ATORVASTATIN CALCIUM 40 MG PO TABS: 40.0000 mg | ORAL_TABLET | Freq: Every day | ORAL | 1 refills | Status: DC

## 2018-07-06 MED ORDER — EMPAGLIFLOZIN-METFORMIN HCL ER 25-1000 MG PO TB24: 1.0000 | ORAL_TABLET | Freq: Every day | ORAL | 0 refills | Status: DC
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
## 2018-07-06 NOTE — Patient Instructions (Addendum)
When you blood sugar is less than 160 fasting (in the morning before eating), then you should continue your current dose of Xultophy.  When your blood sugar is above 160 fasting, then you should increase your dose by 2 units for 3 days.  If it stays above 160 after 3 days, increase by another 2 units.  If your blood sugar is less than 100, please call our office for instructions and do not administer your medication that day.  Please have your eye exam when you are able.  Sleep Hygiene Tips 1) Get regular. One of the best ways to train your body to sleep well is to go to bed and get up at more or less the same time every day, even on weekends and days off! This regular rhythm will make you feel better and will give your body something to work from. 2) Sleep when sleepy. Only try to sleep when you actually feel tired or sleepy, rather than spending too much time awake in bed. 3) Get up & try again. If you haven't been able to get to sleep after about 20 minutes or more, get up and do something calming or boring until you feel sleepy, then return to bed and try again. Sit quietly on the couch with the lights off (bright light will tell your brain that it is time to wake up), or read something boring like the phone book. Avoid doing anything that is too stimulating or interesting, as this will wake you up even more. 4) Avoid caffeine & nicotine. It is best to avoid consuming any caffeine (in coffee, tea, cola drinks, chocolate, and some medications) or nicotine (cigarettes) for at least 4-6 hours before going to bed. These substances act as stimulants and interfere with the ability to fall asleep 5) Avoid alcohol. It is also best to avoid alcohol for at least 4-6 hours before going to bed. Many people believe that alcohol is relaxing and helps them to get to sleep at first, but it actually interrupts the quality of sleep. 6) Bed is for sleeping. Try not to use your bed for anything  other than sleeping and sex, so that your body comes to associate bed with sleep. If you use bed as a place to watch TV, eat, read, work on your laptop, pay bills, and other things, your body will not learn this Connection. 7) No naps. It is best to avoid taking naps during the day, to make sure that you are tired at bedtime. If you can't make it through the day without a nap, make sure it is for less than an hour and before 3pm. 8) Sleep rituals. You can develop your own rituals of things to remind your body that it is time to sleep - some people find it useful to do relaxing stretches or breathing exercises for 15 minutes before bed each night, or sit calmly with a cup of caffeine-free tea. 9) Bathtime. Having a hot bath 1-2 hours before bedtime can be useful, as it will raise your body temperature, causing you to feel sleepy as your body temperature drops again. Research shows that sleepiness is associated with a drop in body temperature. 10) No clock-watching. Many people who struggle with sleep tend to watch the clock too much. Frequently checking the clock during the night can wake you up (especially if you turn on the light to read the time) and reinforces negative thoughts such as "Oh no, look how late it is, I'll never get  to sleep" or "it's so early, I have only slept for 5 hours, this is terrible." 11) Use a sleep diary. This worksheet can be a useful way of making sure you have the right facts about your sleep, rather than making assumptions. Because a diary involves watching the clock (see point 10) it is a good idea to only use it for two weeks to get an idea of what is going and then perhaps two months down the track to see how you are progressing. 12) Exercise. Regular exercise is a good idea to help with good sleep, but try not to do strenuous exercise in the 4 hours before bedtime. Morning walks are a great way to start the day feeling refreshed! 13) Eat  right. A healthy, balanced diet will help you to sleep well, but timing is important. Some people find that a very empty stomach at bedtime is distracting, so it can be useful to have a light snack, but a heavy meal soon before bed can also interrupt sleep. Some people recommend a warm glass of milk, which contains tryptophan, which acts as a natural sleep inducer. 14) The right space. It is very important that your bed and bedroom are quiet and comfortable for sleeping. A cooler room with enough blankets to stay warm is best, and make sure you have curtains or an eyemask to block out early morning light and earplugs if there is noise outside your room. 15) Keep daytime routine the same. Even if you have a bad night sleep and are tired it is important that you try to keep your daytime activities the same as you had planned. That is, don't avoid activities because you feel tired. This can reinforce the insomnia.

## 2018-07-07 ENCOUNTER — Other Ambulatory Visit: Payer: Self-pay | Admitting: Emergency Medicine

## 2018-07-07 ENCOUNTER — Other Ambulatory Visit: Payer: Self-pay

## 2018-07-07 DIAGNOSIS — E1129 Type 2 diabetes mellitus with other diabetic kidney complication: Secondary | ICD-10-CM

## 2018-07-07 DIAGNOSIS — E1165 Type 2 diabetes mellitus with hyperglycemia: Principal | ICD-10-CM

## 2018-07-07 DIAGNOSIS — R809 Proteinuria, unspecified: Principal | ICD-10-CM

## 2018-07-07 DIAGNOSIS — IMO0002 Reserved for concepts with insufficient information to code with codable children: Secondary | ICD-10-CM

## 2018-07-07 MED ORDER — METFORMIN HCL 1000 MG PO TABS: 1000.0000 mg | ORAL_TABLET | Freq: Every day | ORAL | 1 refills | Status: DC

## 2018-07-07 MED ORDER — EMPAGLIFLOZIN 25 MG PO TABS: 25.0000 mg | ORAL_TABLET | Freq: Every day | ORAL | 1 refills | Status: DC

## 2018-07-07 NOTE — Telephone Encounter (Signed)
Please let Janice Short know that her insurance formulary changed recently and we cannot do the combination of Jardiance and Metformin.  I have ordered the medications separately.  I do want to let her know that the Metformin is no longer in the extended release form, so if she experiences diarrhea/GI upset, she needs to let me know so that we can work with her insurance to try to get the ER form covered. Thanks!

## 2018-07-07 NOTE — Progress Notes (Unsigned)
jardiance

## 2018-07-26 ENCOUNTER — Other Ambulatory Visit: Payer: Self-pay

## 2018-07-26 MED ORDER — HYDROCHLOROTHIAZIDE 12.5 MG PO TABS: 12.5000 mg | ORAL_TABLET | Freq: Every day | ORAL | 0 refills | Status: DC

## 2018-07-26 MED ORDER — LOSARTAN POTASSIUM 50 MG PO TABS: 50.0000 mg | ORAL_TABLET | Freq: Every day | ORAL | 0 refills | Status: DC

## 2018-07-26 NOTE — Telephone Encounter (Signed)
Refill request was sent to Dr. Krichna Sowles for approval and submission.  

## 2018-08-03 ENCOUNTER — Telehealth: Payer: Self-pay | Admitting: Family Medicine

## 2018-08-03 NOTE — Telephone Encounter (Signed)
Please review

## 2018-08-03 NOTE — Telephone Encounter (Signed)
Please call and triage. What kind of discharge, pain or itching? Any smell?

## 2018-08-03 NOTE — Telephone Encounter (Signed)
Copied from [place] [phone]. Topic: Quick Communication - See Telephone Encounter >> Aug 03, 2018  2:17 PM Mylinda Latina, NT wrote: CRM for notification. See Telephone encounter for: 08/03/18. Patient called and states she needs a rx for an antibiotic . Patient states she has a yeast infection . Patient is experiencing the discomfort , feel weird when she urinates. Please advise.Thank you  [place], [place] - [place] [phone] (Phone) [phone] (Fax)

## 2018-08-04 ENCOUNTER — Ambulatory Visit: Payer: Self-pay

## 2018-08-04 NOTE — Telephone Encounter (Signed)
I tried to contact this patient to see what her sx are but there was no answer. A message was left for her to give [place] a call when she got the chance.

## 2018-08-04 NOTE — Telephone Encounter (Signed)
Outgoing call to patient to triage related to  symptoms patient is experiencing.  Patient was  vague. States she "already told  her what symptoms I having." just a light discharge.  No odor" Related to Patient that I needed to triage her per Dr. Ancil Boozer request.    States she is a diabetic and her partner is a diabetic".  States it an" acidic type of discharge".     Answer Assessment - Initial Assessment Questions 1. SYMPTOM: "What's the main symptom you're concerned about?" (e.g., discharge, rash, swelling, pain, itching)     Slight discharge 2. LOCATION: "Where is the vagina located?"      See notes 3. ONSET: "When did yesterday begin? 4. DISCHARGE: "Is there a vaginal discharge?" If so, ask: "What color is it?" "How much is there?"     Clear in color  No odor 5. CAUSE: "What do you think is causing the states she is a diabetic and her partner is a diabetic"  Answer Assessment - Initial Assessment Questions 1. SYMPTOM: "What's the main symptom you're concerned about?" (e.g., pain, itching, dryness)    itching 2. LOCATION: "Where is the  vaginal located?" (e.g., inside/outside, left/right)     vaginal 3. ONSET: "When did the  Around 1 2 days ago  start?"     4. PAIN: "Is there any pain?" If so, ask: "How bad is it?" (Scale: 1-10; mild, moderate, severe)     mild 5. ITCHING: "Is there any itching?" If so, ask: "How bad is it?" (Scale: 1-10; mild, moderate, severe)     itching 6. CAUSE: "What do you think is causing the discharge?" "Have you had the same problem before? What happened then?"     Yes states" she and her partner are diabetics. States its like an "acidic"  discharge 7. OTHER SYMPTOMS: "Do you have any other symptoms?" (e.g., fever, itching, vaginal bleeding, pain with urination, injury to genital area, vaginal foreign body)     itching 8. PREGNANCY: "Is there any chance you are pregnant?" "When was your last menstrual period?"  Protocols used: VAGINAL SYMPTOMS OR DISCHARGE -  AFTER [id number]-AH, VAGINAL [facility]

## 2018-08-04 NOTE — Telephone Encounter (Signed)
See triage encounter from 08/04/18

## 2018-08-05 MED ORDER — FLUCONAZOLE 150 MG PO TABS: 150.0000 mg | ORAL_TABLET | ORAL | 0 refills | Status: DC

## 2018-08-05 NOTE — Telephone Encounter (Signed)
Please review and advise.

## 2018-08-05 NOTE — Addendum Note (Signed)
Addended by: Steele Sizer F on: 08/05/2018 01:31 PM   Modules accepted: Orders

## 2018-08-05 NOTE — Telephone Encounter (Signed)
Sent diflucan to her pharmacy.  

## 2018-08-19 ENCOUNTER — Other Ambulatory Visit: Payer: Self-pay | Admitting: Family Medicine

## 2018-08-19 DIAGNOSIS — I10 Essential (primary) hypertension: Secondary | ICD-10-CM

## 2018-08-30 ENCOUNTER — Other Ambulatory Visit: Payer: Self-pay | Admitting: Nurse Practitioner

## 2018-08-30 ENCOUNTER — Other Ambulatory Visit: Payer: Self-pay | Admitting: Family Medicine

## 2018-08-30 DIAGNOSIS — J3089 Other allergic rhinitis: Principal | ICD-10-CM

## 2018-08-30 DIAGNOSIS — M170 Bilateral primary osteoarthritis of knee: Secondary | ICD-10-CM

## 2018-08-30 DIAGNOSIS — J302 Other seasonal allergic rhinitis: Secondary | ICD-10-CM

## 2018-10-25 ENCOUNTER — Other Ambulatory Visit: Payer: Self-pay | Admitting: Nurse Practitioner

## 2018-10-25 DIAGNOSIS — M170 Bilateral primary osteoarthritis of knee: Secondary | ICD-10-CM

## 2018-10-31 ENCOUNTER — Other Ambulatory Visit: Payer: Self-pay | Admitting: Family Medicine

## 2018-10-31 MED ORDER — ALBUTEROL SULFATE HFA 108 (90 BASE) MCG/ACT IN AERS: INHALATION_SPRAY | RESPIRATORY_TRACT | 1 refills | Status: DC

## 2018-11-16 ENCOUNTER — Telehealth: Payer: Self-pay

## 2018-11-16 NOTE — Telephone Encounter (Signed)
11/16/18 Spoke with patient about community resources for diabetes management and supplies.MA

## 2019-02-08 ENCOUNTER — Ambulatory Visit (INDEPENDENT_AMBULATORY_CARE_PROVIDER_SITE_OTHER): Payer: Medicare Other | Admitting: Family Medicine

## 2019-02-08 ENCOUNTER — Encounter: Payer: Self-pay | Admitting: Family Medicine

## 2019-02-08 ENCOUNTER — Other Ambulatory Visit: Payer: Self-pay

## 2019-02-08 VITALS — BP 130/84 | HR 94 | Temp 97.5°F | Resp 16 | Ht 67.0 in | Wt 196.0 lb

## 2019-02-08 DIAGNOSIS — R809 Proteinuria, unspecified: Secondary | ICD-10-CM | POA: Diagnosis not present

## 2019-02-08 DIAGNOSIS — E1142 Type 2 diabetes mellitus with diabetic polyneuropathy: Secondary | ICD-10-CM | POA: Diagnosis not present

## 2019-02-08 DIAGNOSIS — J4520 Mild intermittent asthma, uncomplicated: Secondary | ICD-10-CM

## 2019-02-08 DIAGNOSIS — F17200 Nicotine dependence, unspecified, uncomplicated: Secondary | ICD-10-CM

## 2019-02-08 DIAGNOSIS — I10 Essential (primary) hypertension: Secondary | ICD-10-CM | POA: Diagnosis not present

## 2019-02-08 DIAGNOSIS — Z23 Encounter for immunization: Secondary | ICD-10-CM

## 2019-02-08 DIAGNOSIS — M5440 Lumbago with sciatica, unspecified side: Secondary | ICD-10-CM | POA: Diagnosis not present

## 2019-02-08 DIAGNOSIS — G4700 Insomnia, unspecified: Secondary | ICD-10-CM

## 2019-02-08 DIAGNOSIS — G8929 Other chronic pain: Secondary | ICD-10-CM

## 2019-02-08 DIAGNOSIS — E1129 Type 2 diabetes mellitus with other diabetic kidney complication: Secondary | ICD-10-CM

## 2019-02-08 DIAGNOSIS — J3089 Other allergic rhinitis: Secondary | ICD-10-CM

## 2019-02-08 DIAGNOSIS — F330 Major depressive disorder, recurrent, mild: Secondary | ICD-10-CM | POA: Diagnosis not present

## 2019-02-08 DIAGNOSIS — E1165 Type 2 diabetes mellitus with hyperglycemia: Secondary | ICD-10-CM

## 2019-02-08 DIAGNOSIS — M4807 Spinal stenosis, lumbosacral region: Secondary | ICD-10-CM | POA: Diagnosis not present

## 2019-02-08 DIAGNOSIS — Z114 Encounter for screening for human immunodeficiency virus [HIV]: Secondary | ICD-10-CM | POA: Diagnosis not present

## 2019-02-08 DIAGNOSIS — E1169 Type 2 diabetes mellitus with other specified complication: Secondary | ICD-10-CM

## 2019-02-08 DIAGNOSIS — Z1231 Encounter for screening mammogram for malignant neoplasm of breast: Secondary | ICD-10-CM

## 2019-02-08 DIAGNOSIS — J302 Other seasonal allergic rhinitis: Secondary | ICD-10-CM

## 2019-02-08 DIAGNOSIS — IMO0002 Reserved for concepts with insufficient information to code with codable children: Secondary | ICD-10-CM

## 2019-02-08 DIAGNOSIS — M170 Bilateral primary osteoarthritis of knee: Secondary | ICD-10-CM | POA: Diagnosis not present

## 2019-02-08 DIAGNOSIS — R195 Other fecal abnormalities: Secondary | ICD-10-CM

## 2019-02-08 DIAGNOSIS — E785 Hyperlipidemia, unspecified: Secondary | ICD-10-CM

## 2019-02-08 MED ORDER — BAYER MICROLET LANCETS MISC: 12 refills | Status: DC

## 2019-02-08 MED ORDER — ATORVASTATIN CALCIUM 40 MG PO TABS: 40.0000 mg | ORAL_TABLET | Freq: Every day | ORAL | 1 refills | Status: DC

## 2019-02-08 MED ORDER — FLUTICASONE PROPIONATE 50 MCG/ACT NA SUSP: NASAL | 0 refills | Status: DC

## 2019-02-08 MED ORDER — ALBUTEROL SULFATE HFA 108 (90 BASE) MCG/ACT IN AERS: INHALATION_SPRAY | RESPIRATORY_TRACT | 1 refills | Status: DC

## 2019-02-08 MED ORDER — EMPAGLIFLOZIN-METFORMIN HCL ER 25-1000 MG PO TB24: 1.0000 | ORAL_TABLET | Freq: Every day | ORAL | 0 refills | Status: DC

## 2019-02-08 MED ORDER — VOLTAREN 1 % TD GEL: TRANSDERMAL | 0 refills | Status: DC

## 2019-02-08 MED ORDER — INSULIN PEN NEEDLE 32G X 6 MM MISC: 1.0000 | Freq: Every day | 1 refills | Status: DC

## 2019-02-08 MED ORDER — ESCITALOPRAM OXALATE 5 MG PO TABS: ORAL_TABLET | ORAL | 0 refills | Status: DC

## 2019-02-08 MED ORDER — GLUCOSE BLOOD VI STRP: ORAL_STRIP | 2 refills | Status: DC

## 2019-02-08 MED ORDER — LOSARTAN POTASSIUM-HCTZ 50-12.5 MG PO TABS: 1.0000 | ORAL_TABLET | Freq: Every day | ORAL | 1 refills | Status: DC

## 2019-02-08 MED ORDER — GABAPENTIN 300 MG PO CAPS: 600.0000 mg | ORAL_CAPSULE | Freq: Two times a day (BID) | ORAL | 1 refills | Status: DC

## 2019-02-08 MED ORDER — INSULIN DEGLUDEC-LIRAGLUTIDE 100-3.6 UNIT-MG/ML ~~LOC~~ SOPN: 16.0000 [IU] | PEN_INJECTOR | Freq: Every day | SUBCUTANEOUS | 2 refills | Status: DC

## 2019-02-08 MED ORDER — MELATONIN 3 MG PO TABS: 3.0000 mg | ORAL_TABLET | Freq: Every day | ORAL | 0 refills | Status: DC

## 2019-02-08 NOTE — Assessment & Plan Note (Signed)
Restart medications, labs

## 2019-02-08 NOTE — Assessment & Plan Note (Signed)
Continue Statin medication

## 2019-02-08 NOTE — Assessment & Plan Note (Signed)
Flonase and zyrtec daily
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
## 2019-02-08 NOTE — Assessment & Plan Note (Signed)
Taking gabapentin

## 2019-02-08 NOTE — Assessment & Plan Note (Signed)
Lexapro start today - 5mg , take Melatonin at night for sleep.

## 2019-02-08 NOTE — Assessment & Plan Note (Signed)
Stable, labs today

## 2019-02-08 NOTE — Assessment & Plan Note (Signed)
Discussed increased risk for MI/CVA, etc. She is on GLP-therapy

## 2019-02-08 NOTE — Assessment & Plan Note (Signed)
Stable on gabapentin.   

## 2019-02-08 NOTE — Assessment & Plan Note (Signed)
Taking zyrtec, using albuterol PRN, declines singulair today, but may consider in the future.

## 2019-02-08 NOTE — Assessment & Plan Note (Signed)
Not ready to quit, smoking 10 cigarettes weekly

## 2019-02-09 LAB — COMPLETE METABOLIC PANEL WITH GFR
AG RATIO: 1.2 (calc) (ref 1.0–2.5)
ALT: 33 U/L — ABNORMAL HIGH (ref 6–29)
AST: 23 U/L (ref 10–35)
Albumin: 4.2 g/dL (ref 3.6–5.1)
Alkaline phosphatase (APISO): 148 U/L (ref 37–153)
BUN: 15 mg/dL (ref 7–25)
CHLORIDE: 104 mmol/L (ref 98–110)
CO2: 28 mmol/L (ref 20–32)
Calcium: 9.8 mg/dL (ref 8.6–10.4)
Creat: 0.96 mg/dL (ref 0.50–1.05)
GFR, Est African American: 75 mL/min/{1.73_m2} (ref >=60)
GFR, Est Non African American: 65 mL/min/{1.73_m2} (ref >=60)
GLOBULIN: 3.4 g/dL (ref 1.9–3.7)
Glucose, Bld: 196 mg/dL — ABNORMAL HIGH (ref 65–99)
Potassium: 4.4 mmol/L (ref 3.5–5.3)
SODIUM: 140 mmol/L (ref 135–146)
Total Bilirubin: 0.4 mg/dL (ref 0.2–1.2)
Total Protein: 7.6 g/dL (ref 6.1–8.1)

## 2019-02-09 LAB — HEMOGLOBIN A1C
Hgb A1c MFr Bld: 9.2 % of total Hgb — ABNORMAL HIGH (ref <5.7)
Mean Plasma Glucose: 217 (calc)
eAG (mmol/L): 12 (calc)

## 2019-02-09 LAB — LIPID PANEL
CHOL/HDL RATIO: 4.1 (calc) (ref <5.0)
Cholesterol: 126 mg/dL (ref <200)
HDL: 31 mg/dL — ABNORMAL LOW (ref >=50)
LDL Cholesterol (Calc): 70 mg/dL (calc)
Non-HDL Cholesterol (Calc): 95 mg/dL (calc) (ref <130)
Triglycerides: 177 mg/dL — ABNORMAL HIGH (ref <150)

## 2019-02-09 LAB — HIV ANTIBODY (ROUTINE TESTING W REFLEX): HIV 1&2 Ab, 4th Generation: NONREACTIVE

## 2019-03-13 ENCOUNTER — Other Ambulatory Visit: Payer: Self-pay

## 2019-03-13 ENCOUNTER — Ambulatory Visit (INDEPENDENT_AMBULATORY_CARE_PROVIDER_SITE_OTHER): Payer: Medicare HMO | Admitting: Family Medicine

## 2019-03-13 ENCOUNTER — Encounter: Payer: Self-pay | Admitting: Family Medicine

## 2019-03-13 DIAGNOSIS — E1129 Type 2 diabetes mellitus with other diabetic kidney complication: Secondary | ICD-10-CM | POA: Diagnosis not present

## 2019-03-13 DIAGNOSIS — E1165 Type 2 diabetes mellitus with hyperglycemia: Secondary | ICD-10-CM

## 2019-03-13 DIAGNOSIS — F330 Major depressive disorder, recurrent, mild: Secondary | ICD-10-CM | POA: Diagnosis not present

## 2019-03-13 DIAGNOSIS — M170 Bilateral primary osteoarthritis of knee: Secondary | ICD-10-CM

## 2019-03-13 DIAGNOSIS — I10 Essential (primary) hypertension: Secondary | ICD-10-CM | POA: Diagnosis not present

## 2019-03-13 DIAGNOSIS — R69 Illness, unspecified: Secondary | ICD-10-CM | POA: Diagnosis not present

## 2019-03-13 DIAGNOSIS — IMO0002 Reserved for concepts with insufficient information to code with codable children: Secondary | ICD-10-CM

## 2019-03-13 DIAGNOSIS — R809 Proteinuria, unspecified: Secondary | ICD-10-CM

## 2019-03-13 MED ORDER — LOSARTAN POTASSIUM 50 MG PO TABS: 50.0000 mg | ORAL_TABLET | Freq: Every day | ORAL | 1 refills | Status: DC

## 2019-03-13 MED ORDER — ESCITALOPRAM OXALATE 10 MG PO TABS: 10.0000 mg | ORAL_TABLET | Freq: Every day | ORAL | 0 refills | Status: DC

## 2019-03-13 MED ORDER — HYDROCHLOROTHIAZIDE 12.5 MG PO TABS: 12.5000 mg | ORAL_TABLET | Freq: Every day | ORAL | 1 refills | Status: DC

## 2019-03-13 MED ORDER — DICLOFENAC SODIUM 1 % TD GEL: 2.0000 g | Freq: Four times a day (QID) | TRANSDERMAL | 2 refills | Status: DC
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
## 2019-03-15 ENCOUNTER — Ambulatory Visit: Payer: Medicare Other | Admitting: Family Medicine

## 2019-04-13 ENCOUNTER — Other Ambulatory Visit: Payer: Self-pay | Admitting: Family Medicine

## 2019-04-13 DIAGNOSIS — M4807 Spinal stenosis, lumbosacral region: Secondary | ICD-10-CM

## 2019-04-13 DIAGNOSIS — M5440 Lumbago with sciatica, unspecified side: Principal | ICD-10-CM

## 2019-04-13 DIAGNOSIS — E1142 Type 2 diabetes mellitus with diabetic polyneuropathy: Secondary | ICD-10-CM

## 2019-04-13 DIAGNOSIS — G8929 Other chronic pain: Secondary | ICD-10-CM

## 2019-04-14 NOTE — Addendum Note (Signed)
Addended by: Inda Coke on: 04/14/2019 03:35 PM   Modules accepted: Orders

## 2019-04-14 NOTE — Telephone Encounter (Signed)
Pt called stating she is out of medication. Advised her request was received 5/7 after closing and we request 3 business days on refills. Pt asking if it can be sent today.  [place], [place]

## 2019-04-17 NOTE — Telephone Encounter (Signed)
Please find out if patient was able to obtain her gabapentin or if I need to send it in.  It looks like Tiffany sent it in already, but wanted to make sure.

## 2019-04-20 ENCOUNTER — Other Ambulatory Visit: Payer: Self-pay | Admitting: Emergency Medicine

## 2019-04-20 DIAGNOSIS — G8929 Other chronic pain: Secondary | ICD-10-CM

## 2019-04-20 DIAGNOSIS — M4807 Spinal stenosis, lumbosacral region: Secondary | ICD-10-CM

## 2019-04-20 DIAGNOSIS — E1142 Type 2 diabetes mellitus with diabetic polyneuropathy: Secondary | ICD-10-CM

## 2019-06-01 ENCOUNTER — Other Ambulatory Visit: Payer: Self-pay

## 2019-06-01 ENCOUNTER — Encounter: Payer: Self-pay | Admitting: Family Medicine

## 2019-06-01 ENCOUNTER — Ambulatory Visit (INDEPENDENT_AMBULATORY_CARE_PROVIDER_SITE_OTHER): Payer: Medicare HMO | Admitting: Family Medicine

## 2019-06-01 DIAGNOSIS — E1142 Type 2 diabetes mellitus with diabetic polyneuropathy: Secondary | ICD-10-CM | POA: Diagnosis not present

## 2019-06-01 DIAGNOSIS — E1165 Type 2 diabetes mellitus with hyperglycemia: Secondary | ICD-10-CM

## 2019-06-01 DIAGNOSIS — E785 Hyperlipidemia, unspecified: Secondary | ICD-10-CM

## 2019-06-01 DIAGNOSIS — R809 Proteinuria, unspecified: Secondary | ICD-10-CM

## 2019-06-01 DIAGNOSIS — F330 Major depressive disorder, recurrent, mild: Secondary | ICD-10-CM

## 2019-06-01 DIAGNOSIS — E1169 Type 2 diabetes mellitus with other specified complication: Secondary | ICD-10-CM

## 2019-06-01 DIAGNOSIS — E1129 Type 2 diabetes mellitus with other diabetic kidney complication: Secondary | ICD-10-CM

## 2019-06-01 DIAGNOSIS — R69 Illness, unspecified: Secondary | ICD-10-CM | POA: Diagnosis not present

## 2019-06-01 DIAGNOSIS — IMO0002 Reserved for concepts with insufficient information to code with codable children: Secondary | ICD-10-CM

## 2019-06-01 MED ORDER — XULTOPHY 100-3.6 UNIT-MG/ML ~~LOC~~ SOPN: 16.0000 [IU] | PEN_INJECTOR | Freq: Every day | SUBCUTANEOUS | 2 refills | Status: DC

## 2019-06-01 MED ORDER — SYNJARDY XR 25-1000 MG PO TB24: 1.0000 | ORAL_TABLET | Freq: Every day | ORAL | 0 refills | Status: DC

## 2019-06-01 MED ORDER — ESCITALOPRAM OXALATE 10 MG PO TABS: 10.0000 mg | ORAL_TABLET | Freq: Every day | ORAL | 0 refills | Status: DC

## 2019-06-01 NOTE — Progress Notes (Signed)
Name: Janice Short   MRN: [id number]    DOB: 08-Mar-1961   Date:06/01/2019       Progress Note  Subjective  Chief Complaint  Chief Complaint  Patient presents with  . Depression  . Diabetes    She says that she got her sugar down to 110.    I connected with  [name]  on 06/01/19 at 11:20 AM EDT by a video enabled telemedicine application and verified that I am speaking with the correct person using two identifiers.  I discussed the limitations of evaluation and management by telemedicine and the availability of in person appointments. The patient expressed understanding and agreed to proceed. Staff also discussed with the patient that there may be a patient responsible charge related to this service. Patient Location: at home  Provider Location: [facility]   HPI  DMII:  She was supposed to go up to 50 units daily on Xultophy but she is still on 48 units and past 4 days she has only given herself 16 units because she was running out of medication. She states last time she checked glucose was 2 days ago fasting and it was 110. She is also on Synjardi. Checking glucose a few times a week, highest has been 125, usually in the 1 teens. She denies polyphagia, polyuria or polydipsia. Last A1C was high at 9.2%, she will try to stop by today or tomorrow for repeat A1C. She has dyslipidemia and neuropathy. Taking gabapentin and statin therapy   Morbid obesity: elevated BMI with co-morbidities. Unable to check her weight today. Virtual visit, discussed healthy diet and exercise  MDD: doing better on higher dose of lexapro, frustrated with her son that does not want to help her financially. He works, lives at her house for free, but has money to spend updating his car. Discussed ways to ask him for financial help . Phq 9 has improved with lexapro  Our conversation was cut short, tried calling her back multiple times and message stated : unable to connect at this time.     Patient Active Problem List   Diagnosis Date Noted  . Mild episode of recurrent major depressive disorder ([place]) 02/08/2019  . Dyslipidemia associated with type 2 diabetes mellitus ([place]) 12/17/2017  . Noncompliance with medication regimen 12/17/2017  . Spinal stenosis 10/15/2016  . Diabetic polyneuropathy associated with type 2 diabetes mellitus ([place]) 07/14/2016  . Chronic midline low back pain with sciatica 06/22/2016  . Primary osteoarthritis of both knees 06/22/2016  . Carpal tunnel syndrome 07/01/2015  . Cervical radiculitis 07/01/2015  . Corn or callus 07/01/2015  . Impingement syndrome of shoulder 07/01/2015  . Microalbuminuria 07/01/2015  . Morbid obesity, unspecified obesity type ([place]) 07/01/2015  . Compulsive tobacco user syndrome 07/01/2015  . Tenosynovitis of wrist 07/01/2015  . Benign hypertension 11/05/2009  . Uncontrolled type 2 diabetes mellitus with microalbuminuria ([place]) 11/05/2009  . Athlete's foot 08/05/2009  . Combined fat and carbohydrate induced hyperlipemia 01/08/2009  . Vitamin D deficiency 01/08/2009  . Asthma, mild intermittent 12/28/2008  . Allergic rhinitis 12/28/2008  . Lumbosacral neuritis 08/11/2007    Past Surgical History:  Procedure Laterality Date  . ABDOMINAL HYSTERECTOMY      Family History  Problem Relation Age of Onset  . Heart attack Mother   . CAD Mother   . Kidney disease Father     Social History   Socioeconomic History  . Marital status: Single    Spouse name: Not on file  .  Number of children: 2  . Years of education: Not on file  . Highest education level: Not on file  Occupational History  . Not on file  Social Needs  . Financial resource strain: Hard  . Food insecurity    Worry: Often true    Inability: Often true  . Transportation needs    Medical: Yes    Non-medical: Yes  Tobacco Use  . Smoking status: Current Some Day Smoker  . Smokeless tobacco: Never Used  Substance and Sexual Activity  . Alcohol use: No     Alcohol/week: 0.0 standard drinks  . Drug use: No  . Sexual activity: Yes  Lifestyle  . Physical activity    Days per week: 5 days    Minutes per session: 30 min  . Stress: Very much  Relationships  . Social connections    Talks on phone: More than three times a week    Gets together: Once a week    Attends religious service: More than 4 times per year    Active member of club or organization: No    Attends meetings of clubs or organizations: Never    Relationship status: Never married  . Intimate partner violence    Fear of current or ex partner: No    Emotionally abused: No    Physically abused: No    Forced sexual activity: No  Other Topics Concern  . Not on file  Social History Narrative  . Not on file     Current Outpatient Medications:  .  acetaminophen (TYLENOL) 500 MG tablet, Take 1 tablet by mouth at bedtime as needed., Disp: , Rfl:  .  albuterol (PROVENTIL HFA;VENTOLIN HFA) 108 (90 Base) MCG/ACT inhaler, INHALE 2 PUFFS BY MOUTH EVERY 6 HOURS AS NEEDED FOR WHEEZING AND FOR SHORTNESS OF BREATH, Disp: 18 g, Rfl: 1 .  aspirin 81 MG tablet, Take 1 tablet by mouth daily., Disp: , Rfl:  .  atorvastatin (LIPITOR) 40 MG tablet, Take 1 tablet (40 mg total) by mouth at bedtime., Disp: 90 tablet, Rfl: 1 .  BAYER MICROLET LANCETS lancets, Use as instructed, Disp: 100 each, Rfl: 12 .  diclofenac sodium (VOLTAREN) 1 % GEL, Apply 2 g topically 4 (four) times daily., Disp: 100 g, Rfl: 2 .  Empagliflozin-metFORMIN HCl ER (SYNJARDY XR) 25-1000 MG TB24, Take 1 tablet by mouth daily., Disp: 90 tablet, Rfl: 0 .  escitalopram (LEXAPRO) 10 MG tablet, Take 1 tablet (10 mg total) by mouth daily. **NOTE DOSE CHANGE**, Disp: 90 tablet, Rfl: 0 .  fluticasone (FLONASE) 50 MCG/ACT nasal spray, USE 2 SPRAY(S) IN EACH NOSTRIL AT BEDTIME, Disp: 48 g, Rfl: 0 .  gabapentin (NEURONTIN) 300 MG capsule, Take 2 capsules by mouth twice daily, Disp: 180 capsule, Rfl: 0 .  glucose blood (BAYER CONTOUR  TEST) test strip, Use as instructed, Disp: 100 each, Rfl: 2 .  hydrochlorothiazide (HYDRODIURIL) 12.5 MG tablet, Take 1 tablet (12.5 mg total) by mouth daily., Disp: 90 tablet, Rfl: 1 .  Insulin Degludec-Liraglutide (XULTOPHY) 100-3.6 UNIT-MG/ML SOPN, Inject 16-50 Units into the skin daily. Start at 69 and go up by 2 units every 2 days for fasting 90-140, Disp: 9 mL, Rfl: 2 .  Insulin Pen Needle (NOVOFINE) 32G X 6 MM MISC, 1 each by Does not apply route daily., Disp: 100 each, Rfl: 1 .  losartan (COZAAR) 50 MG tablet, Take 1 tablet (50 mg total) by mouth daily., Disp: 90 tablet, Rfl: 1 .  cetirizine (ZYRTEC) 10 MG  tablet, Take 10 mg by mouth daily., Disp: , Rfl:   Allergies  Allergen Reactions  . Other Shortness Of Breath    Cat dander and grass pollen  . Bydureon [Exenatide] Other (See Comments)    Pain with injection not allergy    I personally reviewed active problem list, medication list, allergies, family history, social history with the patient/caregiver today.   ROS  Ten systems reviewed and is negative except as mentioned in HPI   Objective  No vital signs  There is no height or weight on file to calculate BMI.  Physical Exam  Awake, alert and oriented   PHQ2/9: Depression screen [facility] 2/9 06/01/2019 03/13/2019 02/08/2019 07/06/2018 12/17/2017  Decreased Interest 0 0 0 0 0  Down, Depressed, Hopeless 1 2 1 1  0  PHQ - 2 Score 1 2 1 1  0  Altered sleeping 0 1 1 2  -  Tired, decreased energy 0 0 1 2 -  Change in appetite 0 2 0 0 -  Feeling bad or failure about yourself  0 0 1 0 -  Trouble concentrating 0 0 1 1 -  Moving slowly or fidgety/restless 0 0 0 0 -  Suicidal thoughts 0 0 0 0 -  PHQ-9 Score 1 5 5 6  -  Difficult doing work/chores - Not difficult at all Somewhat difficult Somewhat difficult -   PHQ-2/9 Result is positive.    Fall Risk: Fall Risk  06/01/2019 02/08/2019 07/06/2018 12/17/2017 10/15/2016  Falls in the past year? 0 0 No No No  Number falls in past yr: 0 0 - - -   Injury with Fall? 0 0 - - -  Comment - - - - -  Follow up - Falls evaluation completed - - -     Assessment & Plan  1. Mild episode of recurrent major depressive disorder (HCC)  - escitalopram (LEXAPRO) 10 MG tablet; Take 1 tablet (10 mg total) by mouth daily.   Dispense: 90 tablet; Refill: 0  2. Diabetic polyneuropathy associated with type 2 diabetes mellitus (HCC)  - Insulin Degludec-Liraglutide (XULTOPHY) 100-3.6 UNIT-MG/ML SOPN; Inject 16-50 Units into the skin daily. Start at 42 and go up by 2 units every 2 days for fasting 90-140  Dispense: 9 mL; Refill: 2 - Empagliflozin-metFORMIN HCl ER (SYNJARDY XR) 25-1000 MG TB24; Take 1 tablet by mouth daily.  Dispense: 90 tablet; Refill: 0  3. Uncontrolled type 2 diabetes mellitus with microalbuminuria (HCC)  - Insulin Degludec-Liraglutide (XULTOPHY) 100-3.6 UNIT-MG/ML SOPN; Inject 16-50 Units into the skin daily. Start at 40 and go up by 2 units every 2 days for fasting 90-140  Dispense: 9 mL; Refill: 2 - Empagliflozin-metFORMIN HCl ER (SYNJARDY XR) 25-1000 MG TB24; Take 1 tablet by mouth daily.  Dispense: 90 tablet; Refill: 0  4. Dyslipidemia type 2 diabetes mellitus ( [facility] )  - Insulin Degludec-Liraglutide (XULTOPHY) 100-3.6 UNIT-MG/ML SOPN; Inject 16-50 Units into the skin daily. Start at 92 and go up by 2 units every 2 days for fasting 90-140  Dispense: 9 mL; Refill: 2  5. Morbid obesity, unspecified obesity type [facility])  Discussed with the patient the risk posed by an increased BMI. Discussed importance of portion control, calorie counting and at least 150 minutes of physical activity weekly. Avoid sweet beverages and drink more water. Eat at least 6 servings of fruit and vegetables daily   I discussed the assessment and treatment plan with the patient. The patient was provided an opportunity to ask questions and all were answered.  The patient agreed with the plan and demonstrated an understanding of the instructions.  The patient was  advised to call back or seek an in-person evaluation if the symptoms worsen or if the condition fails to improve as anticipated.  I provided 25  minutes of non-face-to-face time during this encounter.

## 2019-06-08 ENCOUNTER — Other Ambulatory Visit: Payer: Self-pay | Admitting: Family Medicine

## 2019-06-08 DIAGNOSIS — M5440 Lumbago with sciatica, unspecified side: Secondary | ICD-10-CM

## 2019-06-08 DIAGNOSIS — E1142 Type 2 diabetes mellitus with diabetic polyneuropathy: Secondary | ICD-10-CM

## 2019-06-08 DIAGNOSIS — G8929 Other chronic pain: Secondary | ICD-10-CM

## 2019-06-08 DIAGNOSIS — M4807 Spinal stenosis, lumbosacral region: Secondary | ICD-10-CM

## 2019-07-13 ENCOUNTER — Other Ambulatory Visit: Payer: Self-pay | Admitting: Family Medicine

## 2019-07-13 DIAGNOSIS — M4807 Spinal stenosis, lumbosacral region: Secondary | ICD-10-CM

## 2019-07-13 DIAGNOSIS — E1142 Type 2 diabetes mellitus with diabetic polyneuropathy: Secondary | ICD-10-CM

## 2019-07-13 DIAGNOSIS — M5440 Lumbago with sciatica, unspecified side: Secondary | ICD-10-CM

## 2019-07-13 DIAGNOSIS — G8929 Other chronic pain: Secondary | ICD-10-CM

## 2019-07-13 DIAGNOSIS — I10 Essential (primary) hypertension: Secondary | ICD-10-CM

## 2019-08-15 ENCOUNTER — Other Ambulatory Visit: Payer: Self-pay | Admitting: Family Medicine

## 2019-08-15 DIAGNOSIS — G8929 Other chronic pain: Secondary | ICD-10-CM

## 2019-08-15 DIAGNOSIS — E1142 Type 2 diabetes mellitus with diabetic polyneuropathy: Secondary | ICD-10-CM

## 2019-08-15 DIAGNOSIS — M4807 Spinal stenosis, lumbosacral region: Secondary | ICD-10-CM

## 2019-08-15 DIAGNOSIS — M5440 Lumbago with sciatica, unspecified side: Secondary | ICD-10-CM

## 2019-08-29 ENCOUNTER — Other Ambulatory Visit: Payer: Self-pay | Admitting: Family Medicine

## 2019-08-29 DIAGNOSIS — F330 Major depressive disorder, recurrent, mild: Secondary | ICD-10-CM

## 2019-08-29 DIAGNOSIS — E1129 Type 2 diabetes mellitus with other diabetic kidney complication: Secondary | ICD-10-CM

## 2019-08-29 DIAGNOSIS — E1169 Type 2 diabetes mellitus with other specified complication: Secondary | ICD-10-CM

## 2019-08-29 DIAGNOSIS — I10 Essential (primary) hypertension: Secondary | ICD-10-CM

## 2019-08-29 DIAGNOSIS — IMO0002 Reserved for concepts with insufficient information to code with codable children: Secondary | ICD-10-CM

## 2019-08-29 DIAGNOSIS — E1142 Type 2 diabetes mellitus with diabetic polyneuropathy: Secondary | ICD-10-CM

## 2019-08-29 NOTE — Telephone Encounter (Signed)
Medication Refill - Medication: escitalopram (LEXAPRO) 10 MG tablet, hydrochlorothiazide (HYDRODIURIL) 12.5 MG tablet, Insulin Degludec-Liraglutide (XULTOPHY) 100-3.6 UNIT-MG/ML SOPN, losartan (COZAAR) 50 MG tablet     Has the patient contacted their pharmacy? No. (Agent: If no, request that the patient contact the pharmacy for the refill.) (Agent: If yes, when and what did the pharmacy advise?)  Preferred Pharmacy (with phone number or street name):  [facility], [place] - [address]  [address] [place] [address]  Phone: [phone] Fax: [phone]  Not a 24 hour pharmacy; exact hours not known.     Agent: Please be advised that RX refills may take up to 3 business days. We ask that you follow-up with your pharmacy.

## 2019-09-01 ENCOUNTER — Telehealth: Payer: Self-pay | Admitting: Family Medicine

## 2019-09-01 ENCOUNTER — Encounter: Payer: Self-pay | Admitting: Family Medicine

## 2019-09-01 ENCOUNTER — Other Ambulatory Visit: Payer: Self-pay

## 2019-09-01 ENCOUNTER — Ambulatory Visit (INDEPENDENT_AMBULATORY_CARE_PROVIDER_SITE_OTHER): Payer: Medicare HMO | Admitting: Family Medicine

## 2019-09-01 VITALS — BP 118/80 | HR 108 | Temp 97.1°F | Resp 14 | Ht 67.0 in | Wt 203.9 lb

## 2019-09-01 DIAGNOSIS — F325 Major depressive disorder, single episode, in full remission: Secondary | ICD-10-CM

## 2019-09-01 DIAGNOSIS — E1142 Type 2 diabetes mellitus with diabetic polyneuropathy: Secondary | ICD-10-CM

## 2019-09-01 DIAGNOSIS — J3089 Other allergic rhinitis: Secondary | ICD-10-CM

## 2019-09-01 DIAGNOSIS — E1129 Type 2 diabetes mellitus with other diabetic kidney complication: Secondary | ICD-10-CM | POA: Diagnosis not present

## 2019-09-01 DIAGNOSIS — R69 Illness, unspecified: Secondary | ICD-10-CM | POA: Diagnosis not present

## 2019-09-01 DIAGNOSIS — R809 Proteinuria, unspecified: Secondary | ICD-10-CM | POA: Diagnosis not present

## 2019-09-01 DIAGNOSIS — E1165 Type 2 diabetes mellitus with hyperglycemia: Secondary | ICD-10-CM | POA: Diagnosis not present

## 2019-09-01 DIAGNOSIS — B353 Tinea pedis: Secondary | ICD-10-CM | POA: Diagnosis not present

## 2019-09-01 DIAGNOSIS — E1169 Type 2 diabetes mellitus with other specified complication: Secondary | ICD-10-CM

## 2019-09-01 DIAGNOSIS — E785 Hyperlipidemia, unspecified: Secondary | ICD-10-CM

## 2019-09-01 DIAGNOSIS — J449 Chronic obstructive pulmonary disease, unspecified: Secondary | ICD-10-CM

## 2019-09-01 DIAGNOSIS — J441 Chronic obstructive pulmonary disease with (acute) exacerbation: Secondary | ICD-10-CM | POA: Diagnosis not present

## 2019-09-01 DIAGNOSIS — I10 Essential (primary) hypertension: Secondary | ICD-10-CM

## 2019-09-01 DIAGNOSIS — E669 Obesity, unspecified: Secondary | ICD-10-CM

## 2019-09-01 DIAGNOSIS — J45901 Unspecified asthma with (acute) exacerbation: Secondary | ICD-10-CM

## 2019-09-01 DIAGNOSIS — J302 Other seasonal allergic rhinitis: Secondary | ICD-10-CM

## 2019-09-01 DIAGNOSIS — IMO0002 Reserved for concepts with insufficient information to code with codable children: Secondary | ICD-10-CM

## 2019-09-01 LAB — POCT GLYCOSYLATED HEMOGLOBIN (HGB A1C): HbA1c, POC (controlled diabetic range): 8.2 % — AB (ref 0.0–7.0)

## 2019-09-01 MED ORDER — XULTOPHY 100-3.6 UNIT-MG/ML ~~LOC~~ SOPN: 50.0000 [IU] | PEN_INJECTOR | Freq: Every day | SUBCUTANEOUS | 2 refills | Status: DC

## 2019-09-01 MED ORDER — HYDROCHLOROTHIAZIDE 12.5 MG PO TABS: 12.5000 mg | ORAL_TABLET | Freq: Every day | ORAL | 1 refills | Status: DC

## 2019-09-01 MED ORDER — TRELEGY ELLIPTA 100-62.5-25 MCG/INH IN AEPB: 1.0000 | INHALATION_SPRAY | Freq: Every day | RESPIRATORY_TRACT | 1 refills | Status: DC

## 2019-09-01 MED ORDER — LOSARTAN POTASSIUM-HCTZ 50-12.5 MG PO TABS: 1.0000 | ORAL_TABLET | Freq: Every day | ORAL | 1 refills | Status: DC

## 2019-09-01 MED ORDER — SYNJARDY XR 12.5-1000 MG PO TB24: 2.0000 | ORAL_TABLET | Freq: Every day | ORAL | 1 refills | Status: DC

## 2019-09-01 MED ORDER — ESCITALOPRAM OXALATE 10 MG PO TABS: 10.0000 mg | ORAL_TABLET | Freq: Every day | ORAL | 1 refills | Status: DC

## 2019-09-01 MED ORDER — ATORVASTATIN CALCIUM 40 MG PO TABS: 40.0000 mg | ORAL_TABLET | Freq: Every day | ORAL | 1 refills | Status: DC

## 2019-09-01 MED ORDER — KETOCONAZOLE POWD: 4.0000 mg | Freq: Two times a day (BID) | 0 refills | Status: DC

## 2019-09-01 MED ORDER — AZITHROMYCIN 250 MG PO TABS: ORAL_TABLET | ORAL | 0 refills | Status: DC

## 2019-09-01 MED ORDER — PREDNISONE 10 MG PO TABS: 10.0000 mg | ORAL_TABLET | Freq: Two times a day (BID) | ORAL | 0 refills | Status: DC

## 2019-09-01 NOTE — Telephone Encounter (Signed)
Lelon Frohlich, with [facility] states they do not have KETOCONAZOLE, BULK, POWD sent in today.

## 2019-09-01 NOTE — Progress Notes (Signed)
Name: Janice Short   MRN: [id number]    DOB: Nov 11, 1961   Date:09/01/2019       Progress Note  Subjective  Chief Complaint  Chief Complaint  Patient presents with  . Medication Refill  . Depression  . Diabetes  . Hyperlipidemia  . Obesity  . Asthma    HPI  DMII:  She was supposed to go up to 50 units daily on Xultophy but she is still on 48 units. She states at home has been 160's fasting lately. She is also on Synjardi and takes at night, advised to change am dose, and we will bump to 25/2000 now  She denies polyphagia, polyuria or polydipsia. Last A1C was high at 9.2% and today it is down to 8.2%  She has dyslipidemia and neuropathy. Taking gabapentin and statin therapy . She is due for eye exam and also discussed podiatrist visit but she wants to hold off on that for now   MDD: doing better on higher dose of lexapro,phq 9 is zero, in remission.   Asthma/copd syndrome: still smokes cigarettes intermittently, used to be a heavy smoker, she states two weeks ago developed cold symptoms with rhinorrhea and congestion, but no fever, chills. She states over the past week she has noticed a productive cough, wheezing, facial pressure but no sob. She is only on ventolin prn, we will add trelegy, prednisone and return if no improvement   HTN: bp is at goal, no chest pain or palpitation    Patient Active Problem List   Diagnosis Date Noted  . Mild episode of recurrent major depressive disorder ([place]) 02/08/2019  . Dyslipidemia associated with type 2 diabetes mellitus ([place]) 12/17/2017  . Noncompliance with medication regimen 12/17/2017  . Spinal stenosis 10/15/2016  . Diabetic polyneuropathy associated with type 2 diabetes mellitus ([place]) 07/14/2016  . Chronic midline low back pain with sciatica 06/22/2016  . Primary osteoarthritis of both knees 06/22/2016  . Carpal tunnel syndrome 07/01/2015  . Cervical radiculitis 07/01/2015  . Corn or callus 07/01/2015  . Impingement syndrome of  shoulder 07/01/2015  . Microalbuminuria 07/01/2015  . Morbid obesity, unspecified obesity type ([place]) 07/01/2015  . Compulsive tobacco user syndrome 07/01/2015  . Tenosynovitis of wrist 07/01/2015  . Benign hypertension 11/05/2009  . Uncontrolled type 2 diabetes mellitus with microalbuminuria ([place]) 11/05/2009  . Athlete's foot 08/05/2009  . Combined fat and carbohydrate induced hyperlipemia 01/08/2009  . Vitamin D deficiency 01/08/2009  . Asthma, mild intermittent 12/28/2008  . Allergic rhinitis 12/28/2008  . Lumbosacral neuritis 08/11/2007    Past Surgical History:  Procedure Laterality Date  . ABDOMINAL HYSTERECTOMY      Family History  Problem Relation Age of Onset  . Heart attack Mother   . CAD Mother   . Kidney disease Father     Social History   Socioeconomic History  . Marital status: Single    Spouse name: Not on file  . Number of children: 2  . Years of education: Not on file  . Highest education level: Not on file  Occupational History  . Not on file  Social Needs  . Financial resource strain: Hard  . Food insecurity    Worry: Often true    Inability: Often true  . Transportation needs    Medical: Yes    Non-medical: Yes  Tobacco Use  . Smoking status: Current Some Day Smoker  . Smokeless tobacco: Never Used  Substance and Sexual Activity  . Alcohol use: No    Alcohol/week:  0.0 standard drinks  . Drug use: No  . Sexual activity: Yes  Lifestyle  . Physical activity    Days per week: 5 days    Minutes per session: 30 min  . Stress: Very much  Relationships  . Social connections    Talks on phone: More than three times a week    Gets together: Once a week    Attends religious service: More than 4 times per year    Active member of club or organization: No    Attends meetings of clubs or organizations: Never    Relationship status: Never married  . Intimate partner violence    Fear of current or ex partner: No    Emotionally abused: No     Physically abused: No    Forced sexual activity: No  Other Topics Concern  . Not on file  Social History Narrative  . Not on file     Current Outpatient Medications:  .  acetaminophen (TYLENOL) 500 MG tablet, Take 1 tablet by mouth at bedtime as needed., Disp: , Rfl:  .  albuterol (PROVENTIL HFA;VENTOLIN HFA) 108 (90 Base) MCG/ACT inhaler, INHALE 2 PUFFS BY MOUTH EVERY 6 HOURS AS NEEDED FOR WHEEZING AND FOR SHORTNESS OF BREATH, Disp: 18 g, Rfl: 1 .  aspirin 81 MG tablet, Take 1 tablet by mouth daily., Disp: , Rfl:  .  atorvastatin (LIPITOR) 40 MG tablet, Take 1 tablet (40 mg total) by mouth at bedtime., Disp: 90 tablet, Rfl: 1 .  BAYER MICROLET LANCETS lancets, Use as instructed, Disp: 100 each, Rfl: 12 .  Empagliflozin-metFORMIN HCl ER (SYNJARDY XR) 25-1000 MG TB24, Take 1 tablet by mouth daily., Disp: 90 tablet, Rfl: 0 .  escitalopram (LEXAPRO) 10 MG tablet, Take 1 tablet (10 mg total) by mouth daily. **NOTE DOSE CHANGE**, Disp: 90 tablet, Rfl: 0 .  gabapentin (NEURONTIN) 300 MG capsule, Take 2 capsules by mouth twice daily, Disp: 120 capsule, Rfl: 0 .  glucose blood (BAYER CONTOUR TEST) test strip, Use as instructed, Disp: 100 each, Rfl: 2 .  hydrochlorothiazide (HYDRODIURIL) 12.5 MG tablet, Take 1 tablet (12.5 mg total) by mouth daily., Disp: 90 tablet, Rfl: 1 .  Insulin Degludec-Liraglutide (XULTOPHY) 100-3.6 UNIT-MG/ML SOPN, Inject 16-50 Units into the skin daily. Start at 59 and go up by 2 units every 2 days for fasting 90-140, Disp: 9 mL, Rfl: 2 .  Insulin Pen Needle (NOVOFINE) 32G X 6 MM MISC, 1 each by Does not apply route daily., Disp: 100 each, Rfl: 1 .  losartan (COZAAR) 50 MG tablet, Take 1 tablet (50 mg total) by mouth daily., Disp: 90 tablet, Rfl: 1 .  cetirizine (ZYRTEC) 10 MG tablet, Take 10 mg by mouth daily., Disp: , Rfl:  .  diclofenac sodium (VOLTAREN) 1 % GEL, Apply 2 g topically 4 (four) times daily. (Patient not taking: Reported on 09/01/2019), Disp: 100 g, Rfl: 2 .   fluticasone (FLONASE) 50 MCG/ACT nasal spray, USE 2 SPRAY(S) IN EACH NOSTRIL AT BEDTIME (Patient not taking: Reported on 09/01/2019), Disp: 48 g, Rfl: 0  Allergies  Allergen Reactions  . Other Shortness Of Breath    Cat dander and grass pollen  . Bydureon [Exenatide] Other (See Comments)    Pain with injection not allergy    I personally reviewed active problem list, medication list, allergies, family history, social history with the patient/caregiver today.   ROS  Constitutional: Negative for fever or weight change.  Respiratory: Positive  for cough and shortness of breath.  Cardiovascular: Negative for chest pain or palpitations.  Gastrointestinal: Negative for abdominal pain, no bowel changes.  Musculoskeletal:Positive for gait problem but no  joint swelling.  Skin: Negative for rash.  Neurological: Negative for dizziness or headache.  No other specific complaints in a complete review of systems (except as listed in HPI above).  Objective  Vitals:   09/01/19 1055  BP: 118/80  Pulse: (!) 108  Resp: 14  Temp: (!) 97.1 F (36.2 C)  SpO2: 94%  Weight: 203 lb 14.4 oz (92.5 kg)  Height: 5\' 7"  (1.702 m)    Body mass index is 31.94 kg/m.  Physical Exam  Constitutional: Patient appears well-developed and well-nourished. Obese  No distress.  HEENT: head atraumatic, normocephalic, pupils equal and reactive to light Cardiovascular: Normal rate, regular rhythm and normal heart sounds.  No murmur heard. No BLE edema. Pulmonary/Chest: Effort normal , bilateral expiratory wheezing. No respiratory distress. Abdominal: Soft.  There is no tenderness. Psychiatric: Patient has a normal mood and affect. behavior is normal. Judgment and thought content normal.  Recent Results (from the past 2160 hour(s))  POCT HgB A1C     Status: Abnormal   Collection Time: 09/01/19 11:04 AM  Result Value Ref Range   Hemoglobin A1C     HbA1c POC (<> result, manual entry)     HbA1c, POC  (prediabetic range)     HbA1c, POC (controlled diabetic range) 8.2 (A) 0.0 - 7.0 %    Diabetic Foot Exam: Diabetic Foot Exam - Simple   Simple Foot Form Diabetic Foot exam was performed with the following findings: Yes 09/01/2019 11:37 AM  Visual Inspection See comments: Yes Sensation Testing Intact to touch and monofilament testing bilaterally: Yes Pulse Check Posterior Tibialis and Dorsalis pulse intact bilaterally: Yes Comments Tinea pedis on toe webs, also corn formation, hammer toe       PHQ2/9: Depression screen [facility] 2/9 09/01/2019 06/01/2019 03/13/2019 02/08/2019 07/06/2018  Decreased Interest 0 0 0 0 0  Down, Depressed, Hopeless 0 1 2 1 1   PHQ - 2 Score 0 1 2 1 1   Altered sleeping 0 0 1 1 2   Tired, decreased energy 0 0 0 1 2  Change in appetite 0 0 2 0 0  Feeling bad or failure about yourself  0 0 0 1 0  Trouble concentrating 0 0 0 1 1  Moving slowly or fidgety/restless 0 0 0 0 0  Suicidal thoughts 0 0 0 0 0  PHQ-9 Score 0 1 5 5 6   Difficult doing work/chores Not difficult at all - Not difficult at all Somewhat difficult Somewhat difficult    phq 9 is negative   Fall Risk: Fall Risk  09/01/2019 06/01/2019 02/08/2019 07/06/2018 12/17/2017  Falls in the past year? 0 0 0 No No  Number falls in past yr: 0 0 0 - -  Injury with Fall? 0 0 0 - -  Comment - - - - -  Follow up - - Falls evaluation completed - -    Functional Status Survey: Is the patient deaf or have difficulty hearing?: No Does the patient have difficulty seeing, even when wearing glasses/contacts?: No Does the patient have difficulty concentrating, remembering, or making decisions?: No Does the patient have difficulty walking or climbing stairs?: No Does the patient have difficulty dressing or bathing?: No Does the patient have difficulty doing errands alone such as visiting a doctor's office or shopping?: No    Assessment & Plan  1. Uncontrolled type 2 diabetes mellitus  with microalbuminuria ([place])  -  POCT HgB A1C - Microalbumin / creatinine urine ratio - Empagliflozin-metFORMIN HCl ER (SYNJARDY XR) 12.04-999 MG TB24; Take 2 tablets by mouth daily. New dose  Dispense: 180 tablet; Refill: 1 - Insulin Degludec-Liraglutide (XULTOPHY) 100-3.6 UNIT-MG/ML SOPN; Inject 50 Units into the skin daily.  Dispense: 9 mL; Refill: 2  2. Dyslipidemia associated with type 2 diabetes mellitus (H[facility])  - atorvastatin (LIPITOR) 40 MG tablet; Take 1 tablet (40 mg total) by mouth at bedtime.  Dispense: 90 tablet; Refill: 1 - Empagliflozin-metFORMIN HCl ER (SYNJARDY XR) 12.04-999 MG TB24; Take 2 tablets by mouth daily. New dose  Dispense: 180 tablet; Refill: 1 - Insulin Degludec-Liraglutide (XULTOPHY) 100-3.6 UNIT-MG/ML SOPN; Inject 50 Units into the skin daily.  Dispense: 9 mL; Refill: 2  3. Obesity   Discussed with the patient the risk posed by an increased BMI. Discussed importance of portion control, calorie counting and at least 150 minutes of physical activity weekly. Avoid sweet beverages and drink more water. Eat at least 6 servings of fruit and vegetables daily   4. Benign hypertension  Change to combo losartan hctz 50/125.5   5. Asthma-COPD overlap syndrome (HCC)  We will start Trelegy today   6. Perennial allergic rhinitis with seasonal variation   7. Acute exacerbation of COPD with asthma ([place])  - predniSONE (DELTASONE) 10 MG tablet; Take 1 tablet (10 mg total) by mouth 2 (two) times daily with a meal.  Dispense: 10 tablet; Refill: 0 - azithromycin (ZITHROMAX) 250 MG tablet; Take as directed  Dispense: 6 tablet; Refill: 0  8. Dyslipidemia associated with type 2 diabetes mellitus (HCC)  - atorvastatin (LIPITOR) 40 MG tablet; Take 1 tablet (40 mg total) by mouth at bedtime.  Dispense: 90 tablet; Refill: 1 - Empagliflozin-metFORMIN HCl ER (SYNJARDY XR) 12.04-999 MG TB24; Take 2 tablets by mouth daily. New dose  Dispense: 180 tablet; Refill: 1 - Insulin Degludec-Liraglutide (XULTOPHY) 100-3.6  UNIT-MG/ML SOPN; Inject 50 Units into the skin daily.  Dispense: 9 mL; Refill: 2  9. Major depression in remission ([place])  - escitalopram (LEXAPRO) 10 MG tablet; Take 1 tablet (10 mg total) by mouth daily.  Dispense: 90 tablet; Refill: 1  10. Diabetic polyneuropathy associated with type 2 diabetes mellitus (HCC)  - Insulin Degludec-Liraglutide (XULTOPHY) 100-3.6 UNIT-MG/ML SOPN; Inject 50 Units into the skin daily.  Dispense: 9 mL; Refill: 2

## 2019-09-02 LAB — MICROALBUMIN / CREATININE URINE RATIO
Creatinine, Urine: 152 mg/dL (ref 20–275)
Microalb Creat Ratio: 7 mcg/mg creat (ref <30)
Microalb, Ur: 1.1 mg/dL

## 2019-09-02 MED ORDER — KETOCONAZOLE 2 % EX CREA: 1.0000 "application " | TOPICAL_CREAM | Freq: Every day | CUTANEOUS | 0 refills | Status: DC

## 2019-09-15 ENCOUNTER — Other Ambulatory Visit: Payer: Self-pay | Admitting: Family Medicine

## 2019-09-15 DIAGNOSIS — E1142 Type 2 diabetes mellitus with diabetic polyneuropathy: Secondary | ICD-10-CM

## 2019-09-15 DIAGNOSIS — G8929 Other chronic pain: Secondary | ICD-10-CM

## 2019-09-15 DIAGNOSIS — M4807 Spinal stenosis, lumbosacral region: Secondary | ICD-10-CM

## 2019-12-18 ENCOUNTER — Other Ambulatory Visit: Payer: Self-pay | Admitting: Family Medicine

## 2019-12-18 DIAGNOSIS — E1142 Type 2 diabetes mellitus with diabetic polyneuropathy: Secondary | ICD-10-CM

## 2019-12-18 DIAGNOSIS — M5440 Lumbago with sciatica, unspecified side: Secondary | ICD-10-CM

## 2019-12-18 DIAGNOSIS — M4807 Spinal stenosis, lumbosacral region: Secondary | ICD-10-CM

## 2019-12-18 DIAGNOSIS — G8929 Other chronic pain: Secondary | ICD-10-CM

## 2020-01-01 ENCOUNTER — Ambulatory Visit: Payer: Medicare HMO | Admitting: Family Medicine

## 2020-01-23 ENCOUNTER — Other Ambulatory Visit: Payer: Self-pay | Admitting: Family Medicine

## 2020-01-23 DIAGNOSIS — IMO0002 Reserved for concepts with insufficient information to code with codable children: Secondary | ICD-10-CM

## 2020-01-23 DIAGNOSIS — E1142 Type 2 diabetes mellitus with diabetic polyneuropathy: Secondary | ICD-10-CM

## 2020-01-23 DIAGNOSIS — E1165 Type 2 diabetes mellitus with hyperglycemia: Secondary | ICD-10-CM

## 2020-01-23 DIAGNOSIS — M4807 Spinal stenosis, lumbosacral region: Secondary | ICD-10-CM

## 2020-01-23 DIAGNOSIS — J449 Chronic obstructive pulmonary disease, unspecified: Secondary | ICD-10-CM

## 2020-01-23 DIAGNOSIS — G8929 Other chronic pain: Secondary | ICD-10-CM

## 2020-01-23 DIAGNOSIS — E1169 Type 2 diabetes mellitus with other specified complication: Secondary | ICD-10-CM

## 2020-01-23 NOTE — Telephone Encounter (Signed)
Requested medication (s) are due for refill today: yes  Requested medication (s) are on the active medication list: yes  Last refill:  09/01/19  Future visit scheduled: yes  Notes to clinic: medication off protocol    Requested Prescriptions  Pending Prescriptions Disp Refills   [place] 100-62.5-25 MCG/INH AEPB [Pharmacy Med Name: Trelegy Ellipta 100-62.5-25 MCG/INH Inhalation Aerosol Powder Breath Activated] 180 each 0    Sig: INHALE 1 PUFF ONCE DAILY      Off-Protocol Failed - 01/23/2020  9:43 AM      Failed - Medication not assigned to a protocol, review manually.      Passed - Valid encounter within last 12 months    Recent Outpatient Visits           4 months ago Uncontrolled type 2 diabetes mellitus with microalbuminuria [facility])   [facility] [place], Drue Stager, MD   7 months ago Mild episode of recurrent major depressive disorder [facility])   [facility] [place], Drue Stager, MD   10 months ago Mild episode of recurrent major depressive disorder [facility])   [facility] [place], FNP   11 months ago Mild episode of recurrent major depressive disorder [facility])   [place], Astrid Divine, FNP   1 year ago Uncontrolled type 2 diabetes mellitus with microalbuminuria [facility])   [place], FNP       Future Appointments             In 3 weeks Steele Sizer, MD [facility] [place] 100-3.6 UNIT-MG/ML SOPN 15 mL 0    Sig: INJECT 50 UNITS SUBCUTANEOUSLY ONCE DAILY      Endocrinology:  Diabetes - Insulin + GLP-1 Receptor Agonist Combos 1 Failed - 01/23/2020  9:43 AM      Failed - HBA1C is between 0 and 7.9 and within 180 days    HbA1c, POC (controlled diabetic range)  Date Value Ref Range Status  09/01/2019 8.2 (A) 0.0 - 7.0 % Final          Passed - Valid encounter within last 6  months    Recent Outpatient Visits           4 months ago Uncontrolled type 2 diabetes mellitus with microalbuminuria [facility])   [facility] [place], Drue Stager, MD   7 months ago Mild episode of recurrent major depressive disorder [facility])   [facility] [place], Drue Stager, MD   10 months ago Mild episode of recurrent major depressive disorder [facility])   [place], Astrid Divine, FNP   11 months ago Mild episode of recurrent major depressive disorder [facility])   [place], Astrid Divine, FNP   1 year ago Uncontrolled type 2 diabetes mellitus with microalbuminuria [facility])   [place], FNP       Future Appointments             In 3 weeks Steele Sizer, MD [facility]              gabapentin (NEURONTIN) 300 MG capsule 180 capsule 0    Sig: Take 2 capsules by mouth twice daily      Neurology: Anticonvulsants - gabapentin Passed - 01/23/2020  9:43 AM  Passed - Valid encounter within last 12 months    Recent Outpatient Visits           4 months ago Uncontrolled type 2 diabetes mellitus with microalbuminuria [facility])   [facility] [place], Drue Stager, MD   7 months ago Mild episode of recurrent major depressive disorder [facility])   [facility] [place], Drue Stager, MD   10 months ago Mild episode of recurrent major depressive disorder [facility])   [place], FNP   11 months ago Mild episode of recurrent major depressive disorder [facility])   [place], FNP   1 year ago Uncontrolled type 2 diabetes mellitus with microalbuminuria [facility])   [place], [place]       Future Appointments             In 3 weeks Steele Sizer, MD [facility], [facility]

## 2020-01-30 ENCOUNTER — Ambulatory Visit (INDEPENDENT_AMBULATORY_CARE_PROVIDER_SITE_OTHER): Payer: Medicare HMO

## 2020-01-30 VITALS — Ht 67.0 in | Wt 210.0 lb

## 2020-01-30 DIAGNOSIS — Z1211 Encounter for screening for malignant neoplasm of colon: Secondary | ICD-10-CM | POA: Diagnosis not present

## 2020-01-30 DIAGNOSIS — Z1231 Encounter for screening mammogram for malignant neoplasm of breast: Secondary | ICD-10-CM | POA: Diagnosis not present

## 2020-01-30 DIAGNOSIS — Z748 Other problems related to care provider dependency: Secondary | ICD-10-CM

## 2020-01-30 DIAGNOSIS — Z0000 Encounter for general adult medical examination without abnormal findings: Secondary | ICD-10-CM

## 2020-01-30 NOTE — Progress Notes (Signed)
Subjective:   Janice Short is a 59 y.o. female who presents for an Initial Medicare Annual Wellness Visit.  Virtual Visit via Telephone Note  I connected with [name] on 01/30/20 at 10:40 AM EST by telephone and verified that I am speaking with the correct person using two identifiers.  Medicare Annual Wellness visit completed telephonically due to Covid-19 pandemic.   Location: Patient: home Provider: office   I discussed the limitations, risks, security and privacy concerns of performing an evaluation and management service by telephone and the availability of in person appointments. The patient expressed understanding and agreed to proceed.  Some vital signs may be absent or patient reported.   Clemetine Marker, LPN    Review of Systems      Cardiac Risk Factors include: dyslipidemia;hypertension;diabetes mellitus;obesity (BMI >30kg/m2)     Objective:    Today's Vitals   01/30/20 1043  Weight: 210 lb (95.3 kg)  Height: 5\' 7"  (1.702 m)   Body mass index is 32.89 kg/m.  Advanced Directives 01/30/2020 01/28/2017 12/03/2016 10/15/2016 07/14/2016 01/09/2016 11/19/2015  Does Patient Have a Medical Advance Directive? No No No No No No No  Would patient like information on creating a medical advance directive? Yes (MAU/Ambulatory/Procedural Areas - Information given) - No - Patient declined No - patient declined information No - patient declined information - No - patient declined information    Current Medications (verified) Outpatient Encounter Medications as of 01/30/2020  Medication Sig  . acetaminophen (TYLENOL) 500 MG tablet Take 1 tablet by mouth at bedtime as needed.  Marland Kitchen albuterol (PROVENTIL HFA;VENTOLIN HFA) 108 (90 Base) MCG/ACT inhaler INHALE 2 PUFFS BY MOUTH EVERY 6 HOURS AS NEEDED FOR WHEEZING AND FOR SHORTNESS OF BREATH  . aspirin 81 MG tablet Take 1 tablet by mouth daily.  Marland Kitchen atorvastatin (LIPITOR) 40 MG tablet Take 1 tablet (40 mg total) by mouth at bedtime.  Marland Kitchen  BAYER MICROLET LANCETS lancets Use as instructed  . cetirizine (ZYRTEC) 10 MG tablet Take 10 mg by mouth daily.  . Empagliflozin-metFORMIN HCl ER (SYNJARDY XR) 12.04-999 MG TB24 Take 2 tablets by mouth daily. New dose  . escitalopram (LEXAPRO) 10 MG tablet Take 1 tablet (10 mg total) by mouth daily.  . fluticasone (FLONASE) 50 MCG/ACT nasal spray USE 2 SPRAY(S) IN EACH NOSTRIL AT BEDTIME  . gabapentin (NEURONTIN) 300 MG capsule Take 2 capsules by mouth twice daily  . glucose blood (BAYER CONTOUR TEST) test strip Use as instructed  . Insulin Pen Needle (NOVOFINE) 32G X 6 MM MISC 1 each by Does not apply route daily.  Marland Kitchen losartan-hydrochlorothiazide (HYZAAR) 50-12.5 MG tablet Take 1 tablet by mouth daily.  . TRELEGY ELLIPTA 100-62.5-25 MCG/INH AEPB INHALE 1 PUFF ONCE DAILY  . XULTOPHY 100-3.6 UNIT-MG/ML SOPN INJECT 50 UNITS SUBCUTANEOUSLY ONCE DAILY  . [DISCONTINUED] azithromycin (ZITHROMAX) 250 MG tablet Take as directed  . [DISCONTINUED] diclofenac sodium (VOLTAREN) 1 % GEL Apply 2 g topically 4 (four) times daily. (Patient not taking: Reported on 09/01/2019)  . [DISCONTINUED] ketoconazole (NIZORAL) 2 % cream Apply 1 application topically daily.  . [DISCONTINUED] KETOCONAZOLE, BULK, POWD 4 mg by Does not apply route 2 (two) times daily.  . [DISCONTINUED] predniSONE (DELTASONE) 10 MG tablet Take 1 tablet (10 mg total) by mouth 2 (two) times daily with a meal.   No facility-administered encounter medications on file as of 01/30/2020.    Allergies (verified) Other and Bydureon [exenatide]   History: Past Medical History:  Diagnosis Date  .  Allergy   . Asthma   . Carpal tunnel syndrome on left   . Cervical radiculitis   . Chronic osteoarthritis   . Corns and callosity   . Depression   . Dermatophytosis of foot   . Diabetes mellitus without complication ([place])   . Gout   . Hyperlipidemia   . Hypertension   . Impingement syndrome of left shoulder   . Lumbosacral neuritis   .  Microalbuminuria   . Obesity   . Supraventricular tachycardia ([place]) 07/01/2015  . SVT (supraventricular tachycardia) ([place])   . Tenosynovitis of wrist   . Vitamin D deficiency    Past Surgical History:  Procedure Laterality Date  . ABDOMINAL HYSTERECTOMY     Family History  Problem Relation Age of Onset  . Heart attack Mother   . CAD Mother   . Kidney disease Father    Social History   Socioeconomic History  . Marital status: Single    Spouse name: Not on file  . Number of children: 2  . Years of education: Not on file  . Highest education level: Not on file  Occupational History  . Not on file  Tobacco Use  . Smoking status: Current Some Day Smoker    Packs/day: 0.25    Types: Cigarettes  . Smokeless tobacco: Never Used  Substance and Sexual Activity  . Alcohol use: No    Alcohol/week: 0.0 standard drinks  . Drug use: No  . Sexual activity: Yes  Other Topics Concern  . Not on file  Social History Narrative  . Not on file   Social Determinants of Health   Financial Resource Strain: Low Risk   . Difficulty of Paying Living Expenses: Not very hard  Food Insecurity: No Food Insecurity  . Worried About Charity fundraiser in the Last Year: Never true  . Ran Out of Food in the Last Year: Never true  Transportation Needs: Unmet Transportation Needs  . Lack of Transportation (Medical): Yes  . Lack of Transportation (Non-Medical): Yes  Physical Activity: Inactive  . Days of Exercise per Week: 0 days  . Minutes of Exercise per Session: 0 min  Stress: Stress Concern Present  . Feeling of Stress : Rather much  Social Connections: Somewhat Isolated  . Frequency of Communication with Friends and Family: More than three times a week  . Frequency of Social Gatherings with Friends and Family: Once a week  . Attends Religious Services: More than 4 times per year  . Active Member of Clubs or Organizations: No  . Attends Archivist Meetings: Never  . Marital  Status: Never married    Tobacco Counseling Ready to quit: Yes Counseling given: Yes   Clinical Intake:  Pre-visit preparation completed: Yes  Pain : No/denies pain     BMI - recorded: 32.89 Nutritional Status: BMI > 30  Obese Nutritional Risks: None Diabetes: Yes CBG done?: No Did pt. bring in CBG monitor from home?: No   Nutrition Risk Assessment:  Has the patient had any N/V/D within the last 2 months?  No  Does the patient have any non-healing wounds?  No  Has the patient had any unintentional weight loss or weight gain?  No   Diabetes:  Is the patient diabetic?  Yes  If diabetic, was a CBG obtained today?  No  Did the patient bring in their glucometer from home?  No  How often do you monitor your CBG's? daily.   Financial Strains and Diabetes Management:  Are you having any financial strains with the device, your supplies or your medication? No .  Does the patient want to be seen by Chronic Care Management for management of their diabetes?  No  Would the patient like to be referred to a Nutritionist or for Diabetic Management?  No   Diabetic Exams:  Diabetic Eye Exam:  Overdue for diabetic eye exam. Pt has been advised about the importance in completing this exam. Pt states she postponed appt last year due to Covid and needs to reschedule with [facility].  Diabetic Foot Exam: Completed 09/01/19.   How often do you need to have someone help you when you read instructions, pamphlets, or other written materials from your doctor or pharmacy?: 1 - Never  Interpreter Needed?: No  Information entered by :: Clemetine Marker LPN   Activities of Daily Living In your present state of health, do you have any difficulty performing the following activities: 01/30/2020 09/01/2019  Hearing? N N  Comment declines hearing aids -  Vision? N N  Difficulty concentrating or making decisions? N N  Walking or climbing stairs? N N  Dressing or bathing? N N  Doing errands,  shopping? N N  Preparing Food and eating ? N -  Using the Toilet? N -  In the past six months, have you accidently leaked urine? N -  Do you have problems with loss of bowel control? N -  Managing your Medications? N -  Managing your Finances? N -  Housekeeping or managing your Housekeeping? N -  Some recent data might be hidden     Immunizations and Health Maintenance Immunization History  Administered Date(s) Administered  . Influenza,inj,Quad PF,6+ Mos 08/14/2013, 08/02/2014, 11/19/2015, 10/15/2016  . Influenza-Unspecified 08/02/2014  . PPD Test 07/01/2015, 09/09/2016  . Pneumococcal Conjugate-13 04/20/2018  . Pneumococcal Polysaccharide-23 02/08/2019  . Td 05/08/2010  . Tdap 05/08/2010   Health Maintenance Due  Topic Date Due  . OPHTHALMOLOGY EXAM  02/23/1969  . PAP SMEAR-Modifier  02/24/1980  . COLONOSCOPY  02/23/2009  . MAMMOGRAM  04/23/2016    Patient Care Team: Steele Sizer, MD as PCP - General (Family Medicine)  Indicate any recent Medical Services you may have received from other than [facility] providers in the past year (date may be approximate).     Assessment:   This is a routine wellness examination for [place].  Hearing/Vision screen  Hearing Screening   125Hz  250Hz  500Hz  1000Hz  2000Hz  3000Hz  4000Hz  6000Hz  8000Hz   Right ear:           Left ear:           Comments: Pt denies hearing difficulty  Vision Screening Comments: Pt past due for eye exam. [facility] at [facility]  Dietary issues and exercise activities discussed: Current Exercise Habits: The patient does not participate in regular exercise at present, Exercise limited by: None identified  Goals    . Quit Smoking     If you wish to quit smoking, help is available. For free tobacco cessation program offerings call the [facility] at (610)332-9558 or Live Well Line at 417-876-6651. You may also visit [email or web address] or email [email or web address] for more information on  other programs.        Depression Screen PHQ 2/9 Scores 01/30/2020 09/01/2019 06/01/2019 03/13/2019 02/08/2019 07/06/2018 12/17/2017  PHQ - 2 Score 0 0 1 2 1 1  0  PHQ- 9 Score - 0 1 5 5 6  -    Fall Risk  Fall Risk  01/30/2020 09/01/2019 06/01/2019 02/08/2019 07/06/2018  Falls in the past year? 0 0 0 0 No  Number falls in past yr: 0 0 0 0 -  Injury with Fall? 0 0 0 0 -  Comment - - - - -  Risk for fall due to : No Fall Risks - - - -  Follow up Falls prevention discussed - - Falls evaluation completed -    FALL RISK PREVENTION PERTAINING TO THE HOME:  Any stairs in or around the home? No  If so, do they handrails? No   - ramp outside  Home free of loose throw rugs in walkways, pet beds, electrical cords, etc? Yes  Adequate lighting in your home to reduce risk of falls? Yes   ASSISTIVE DEVICES UTILIZED TO PREVENT FALLS:  Life alert? No  Use of a cane, walker or w/c? No  Grab bars in the bathroom? No  Shower chair or bench in shower? No  Elevated toilet seat or a handicapped toilet? No   DME ORDERS:  DME order needed?  No   TIMED UP AND GO:  Was the test performed? No . Telephonic visit.   Education: Fall risk prevention has been discussed.  Intervention(s) required? No   Cognitive Function:     6CIT Screen 01/30/2020  What Year? 0 points  What month? 0 points  What time? 0 points  Count back from 20 0 points  Months in reverse 2 points  Repeat phrase 0 points  Total Score 2    Screening Tests Health Maintenance  Topic Date Due  . OPHTHALMOLOGY EXAM  02/23/1969  . PAP SMEAR-Modifier  02/24/1980  . COLONOSCOPY  02/23/2009  . MAMMOGRAM  04/23/2016  . INFLUENZA VACCINE  03/06/2020 (Originally 07/08/2019)  . HEMOGLOBIN A1C  02/29/2020  . TETANUS/TDAP  05/08/2020  . FOOT EXAM  08/31/2020  . PNEUMOCOCCAL POLYSACCHARIDE VACCINE AGE 41-64 HIGH RISK  Completed  . Hepatitis C Screening  Completed  . HIV Screening  Completed    Qualifies for Shingles Vaccine? Yes . Due for  Shingrix. Education has been provided regarding the importance of this vaccine. Pt has been advised to call insurance company to determine out of pocket expense. Advised may also receive vaccine at local pharmacy or Health Dept. Verbalized acceptance and understanding.  Tdap: Up to date  Flu Vaccine: Due for Flu vaccine. Does the patient want to receive this vaccine today?  No . Education has been provided regarding the importance of this vaccine but still declined. Advised may receive this vaccine at local pharmacy or Health Dept. Aware to provide a copy of the vaccination record if obtained from local pharmacy or Health Dept. Verbalized acceptance and understanding.  Pneumococcal Vaccine: Up to date  Cancer Screenings:  Colorectal Screening: DUE - Cologuard ordered today.   Mammogram: Completed 2016. Repeat every year. Ordered today. Pt provided with contact information and advised to call to schedule appt.   Bone Density: due at age 66   Lung Cancer Screening: (Low Dose CT Chest recommended if Age 19-80 years, 30 pack-year currently smoking OR have quit w/in 15years.) does not qualify.   Additional Screening:  Hepatitis C Screening: does qualify; Completed 06/02/13  Vision Screening: Recommended annual ophthalmology exams for early detection of glaucoma and other disorders of the eye. Is the patient up to date with their annual eye exam?  No  - postponed due to Covid Who is the provider or what is the name of the office in which  the pt attends annual eye exams? [place] Screening: Recommended annual dental exams for proper oral hygiene  Community Resource Referral:  CRR required this visit?  YES - transportation assistance     Plan:    I have personally reviewed and addressed the Medicare Annual Wellness questionnaire and have noted the following in the patient's chart:  A. Medical and social history B. Use of alcohol, tobacco or illicit drugs  C. Current  medications and supplements D. Functional ability and status E.  Nutritional status F.  Physical activity G. Advance directives H. List of other physicians I.  Hospitalizations, surgeries, and ER visits in previous 12 months J.  [employer] such as hearing and vision if needed, cognitive and depression L. Referrals and appointments   In addition, I have reviewed and discussed with patient certain preventive protocols, quality metrics, and best practice recommendations. A written personalized care plan for preventive services as well as general preventive health recommendations were provided to patient.   Signed,  Clemetine Marker, LPN Nurse Health Advisor   Nurse Notes: none

## 2020-01-30 NOTE — Patient Instructions (Signed)
Janice Short , Thank you for taking time to come for your Medicare Wellness Visit. I appreciate your ongoing commitment to your health goals. Please review the following plan we discussed and let me know if I can assist you in the future.   Screening recommendations/referrals: Colonoscopy: due - Cologuard test ordered today Mammogram: due - Please call [phone] to schedule your mammogram.   Recommended yearly ophthalmology/optometry visit for glaucoma screening and checkup Recommended yearly dental visit for hygiene and checkup  Vaccinations: Influenza vaccine: postponed Pneumococcal vaccine: done 02/08/19 Tdap vaccine: done 6/211 Shingles vaccine: Shingrix discussed. Please contact your pharmacy for coverage information.   Advanced directives: Advance directive discussed with you today. I have provided a copy for you to complete at home and have notarized. Once this is complete please bring a copy in to our office so we can scan it into your chart.  Conditions/risks identified: If you wish to quit smoking, help is available. For free tobacco cessation program offerings call the [facility] at [phone] or Live Well Line at (724) 540-6350. You may also visit [email or web address] or email [email or web address] for more information on other programs.   Next appointment: Please follow up in one year for your Medicare Annual Wellness visit.    Preventive Care 40-64 Years, Female Preventive care refers to lifestyle choices and visits with your health care provider that can promote health and wellness. What does preventive care include?  A yearly physical exam. This is also called an annual well check.  Dental exams once or twice a year.  Routine eye exams. Ask your health care provider how often you should have your eyes checked.  Personal lifestyle choices, including:  Daily care of your teeth and gums.  Regular physical activity.  Eating a healthy  diet.  Avoiding tobacco and drug use.  Limiting alcohol use.  Practicing safe sex.  Taking low-dose aspirin daily starting at age 63.  Taking vitamin and mineral supplements as recommended by your health care provider. What happens during an annual well check? The services and screenings done by your health care provider during your annual well check will depend on your age, overall health, lifestyle risk factors, and family history of disease. Counseling  Your health care provider may ask you questions about your:  Alcohol use.  Tobacco use.  Drug use.  Emotional well-being.  Home and relationship well-being.  Sexual activity.  Eating habits.  Work and work Statistician.  Method of birth control.  Menstrual cycle.  Pregnancy history. Screening  You may have the following tests or measurements:  Height, weight, and BMI.  Blood pressure.  Lipid and cholesterol levels. These may be checked every 5 years, or more frequently if you are over 79 years old.  Skin check.  Lung cancer screening. You may have this screening every year starting at age 104 if you have a 30-pack-year history of smoking and currently smoke or have quit within the past 15 years.  Fecal occult blood test (FOBT) of the stool. You may have this test every year starting at age 83.  Flexible sigmoidoscopy or colonoscopy. You may have a sigmoidoscopy every 5 years or a colonoscopy every 10 years starting at age 14.  Hepatitis C blood test.  Hepatitis B blood test.  Sexually transmitted disease (STD) testing.  Diabetes screening. This is done by checking your blood sugar (glucose) after you have not eaten for a while (fasting). You may have this done every 1-3 years.  Mammogram. This  may be done every 1-2 years. Talk to your health care provider about when you should start having regular mammograms. This may depend on whether you have a family history of breast cancer.  BRCA-related cancer  screening. This may be done if you have a family history of breast, ovarian, tubal, or peritoneal cancers.  Pelvic exam and Pap test. This may be done every 3 years starting at age 46. Starting at age 99, this may be done every 5 years if you have a Pap test in combination with an HPV test.  Bone density scan. This is done to screen for osteoporosis. You may have this scan if you are at high risk for osteoporosis. Discuss your test results, treatment options, and if necessary, the need for more tests with your health care provider. Vaccines  Your health care provider may recommend certain vaccines, such as:  Influenza vaccine. This is recommended every year.  Tetanus, diphtheria, and acellular pertussis (Tdap, Td) vaccine. You may need a Td booster every 10 years.  Zoster vaccine. You may need this after age 72.  Pneumococcal 13-valent conjugate (PCV13) vaccine. You may need this if you have certain conditions and were not previously vaccinated.  Pneumococcal polysaccharide (PPSV23) vaccine. You may need one or two doses if you smoke cigarettes or if you have certain conditions. Talk to your health care provider about which screenings and vaccines you need and how often you need them. This information is not intended to replace advice given to you by your health care provider. Make sure you discuss any questions you have with your health care provider. Document Released: 12/20/2015 Document Revised: 08/12/2016 Document Reviewed: 09/24/2015 Elsevier Interactive Patient Education  2017 [place] Prevention in the Home Falls can cause injuries. They can happen to people of all ages. There are many things you can do to make your home safe and to help prevent falls. What can I do on the outside of my home?  Regularly fix the edges of walkways and driveways and fix any cracks.  Remove anything that might make you trip as you walk through a door, such as a raised step or  threshold.  Trim any bushes or trees on the path to your home.  Use bright outdoor lighting.  Clear any walking paths of anything that might make someone trip, such as rocks or tools.  Regularly check to see if handrails are loose or broken. Make sure that both sides of any steps have handrails.  Any raised decks and porches should have guardrails on the edges.  Have any leaves, snow, or ice cleared regularly.  Use sand or salt on walking paths during winter.  Clean up any spills in your garage right away. This includes oil or grease spills. What can I do in the bathroom?  Use night lights.  Install grab bars by the toilet and in the tub and shower. Do not use towel bars as grab bars.  Use non-skid mats or decals in the tub or shower.  If you need to sit down in the shower, use a plastic, non-slip stool.  Keep the floor dry. Clean up any water that spills on the floor as soon as it happens.  Remove soap buildup in the tub or shower regularly.  Attach bath mats securely with double-sided non-slip rug tape.  Do not have throw rugs and other things on the floor that can make you trip. What can I do in the bedroom?  Use  night lights.  Make sure that you have a light by your bed that is easy to reach.  Do not use any sheets or blankets that are too big for your bed. They should not hang down onto the floor.  Have a firm chair that has side arms. You can use this for support while you get dressed.  Do not have throw rugs and other things on the floor that can make you trip. What can I do in the kitchen?  Clean up any spills right away.  Avoid walking on wet floors.  Keep items that you use a lot in easy-to-reach places.  If you need to reach something above you, use a strong step stool that has a grab bar.  Keep electrical cords out of the way.  Do not use floor polish or wax that makes floors slippery. If you must use wax, use non-skid floor wax.  Do not have  throw rugs and other things on the floor that can make you trip. What can I do with my stairs?  Do not leave any items on the stairs.  Make sure that there are handrails on both sides of the stairs and use them. Fix handrails that are broken or loose. Make sure that handrails are as long as the stairways.  Check any carpeting to make sure that it is firmly attached to the stairs. Fix any carpet that is loose or worn.  Avoid having throw rugs at the top or bottom of the stairs. If you do have throw rugs, attach them to the floor with carpet tape.  Make sure that you have a light switch at the top of the stairs and the bottom of the stairs. If you do not have them, ask someone to add them for you. What else can I do to help prevent falls?  Wear shoes that:  Do not have high heels.  Have rubber bottoms.  Are comfortable and fit you well.  Are closed at the toe. Do not wear sandals.  If you use a stepladder:  Make sure that it is fully opened. Do not climb a closed stepladder.  Make sure that both sides of the stepladder are locked into place.  Ask someone to hold it for you, if possible.  Clearly mark and make sure that you can see:  Any grab bars or handrails.  First and last steps.  Where the edge of each step is.  Use tools that help you move around (mobility aids) if they are needed. These include:  Canes.  Walkers.  Scooters.  Crutches.  Turn on the lights when you go into a dark area. Replace any light bulbs as soon as they burn out.  Set up your furniture so you have a clear path. Avoid moving your furniture around.  If any of your floors are uneven, fix them.  If there are any pets around you, be aware of where they are.  Review your medicines with your doctor. Some medicines can make you feel dizzy. This can increase your chance of falling. Ask your doctor what other things that you can do to help prevent falls. This information is not intended to  replace advice given to you by your health care provider. Make sure you discuss any questions you have with your health care provider. Document Released: 09/19/2009 Document Revised: 04/30/2016 Document Reviewed: 12/28/2014 Elsevier Interactive Patient Education  2017 [employer].

## 2020-01-31 ENCOUNTER — Telehealth: Payer: Self-pay

## 2020-01-31 NOTE — Telephone Encounter (Signed)
Copied from [place] [id number]. Topic: Referral - Status >> Jan 31, 2020 70:11 AM Simone Curia D wrote: 0/02/4960 Spoke with patient about how to sign up for Medicaid transportation. [place] Medicaid transportation has to speak directly to the Medicaid recipient to sign them up. This was explained to the patient.  Ambrose Mantle [phone]

## 2020-02-09 ENCOUNTER — Telehealth: Payer: Self-pay

## 2020-02-09 NOTE — Telephone Encounter (Signed)
Copied from [place] [phone]. Topic: Referral - Status >> Feb 09, 2020  9:81 PM Janice Short wrote: 0/01/5485 Spoke with patient about Medicaid transportation. Patient stated that she has not called yet but will  call them next week. Patient has no other needs at present.  Ambrose Mantle [phone]

## 2020-02-15 ENCOUNTER — Encounter: Payer: Self-pay | Admitting: Family Medicine

## 2020-02-15 ENCOUNTER — Other Ambulatory Visit: Payer: Self-pay

## 2020-02-15 ENCOUNTER — Ambulatory Visit (INDEPENDENT_AMBULATORY_CARE_PROVIDER_SITE_OTHER): Payer: Medicare HMO | Admitting: Family Medicine

## 2020-02-15 DIAGNOSIS — E1165 Type 2 diabetes mellitus with hyperglycemia: Secondary | ICD-10-CM | POA: Diagnosis not present

## 2020-02-15 DIAGNOSIS — J4489 Other specified chronic obstructive pulmonary disease: Secondary | ICD-10-CM

## 2020-02-15 DIAGNOSIS — E785 Hyperlipidemia, unspecified: Secondary | ICD-10-CM

## 2020-02-15 DIAGNOSIS — E1129 Type 2 diabetes mellitus with other diabetic kidney complication: Secondary | ICD-10-CM

## 2020-02-15 DIAGNOSIS — F325 Major depressive disorder, single episode, in full remission: Secondary | ICD-10-CM

## 2020-02-15 DIAGNOSIS — R809 Proteinuria, unspecified: Secondary | ICD-10-CM | POA: Diagnosis not present

## 2020-02-15 DIAGNOSIS — J449 Chronic obstructive pulmonary disease, unspecified: Secondary | ICD-10-CM | POA: Diagnosis not present

## 2020-02-15 DIAGNOSIS — E559 Vitamin D deficiency, unspecified: Secondary | ICD-10-CM

## 2020-02-15 DIAGNOSIS — IMO0002 Reserved for concepts with insufficient information to code with codable children: Secondary | ICD-10-CM

## 2020-02-15 DIAGNOSIS — I10 Essential (primary) hypertension: Secondary | ICD-10-CM | POA: Diagnosis not present

## 2020-02-15 DIAGNOSIS — E1169 Type 2 diabetes mellitus with other specified complication: Secondary | ICD-10-CM | POA: Diagnosis not present

## 2020-02-15 DIAGNOSIS — R69 Illness, unspecified: Secondary | ICD-10-CM | POA: Diagnosis not present

## 2020-02-15 MED ORDER — ATORVASTATIN CALCIUM 40 MG PO TABS: 40.0000 mg | ORAL_TABLET | Freq: Every day | ORAL | 1 refills | Status: DC

## 2020-02-15 MED ORDER — ALBUTEROL SULFATE HFA 108 (90 BASE) MCG/ACT IN AERS: INHALATION_SPRAY | RESPIRATORY_TRACT | 1 refills | Status: DC

## 2020-02-15 MED ORDER — SYNJARDY XR 12.5-1000 MG PO TB24: 2.0000 | ORAL_TABLET | Freq: Every day | ORAL | 1 refills | Status: DC

## 2020-02-15 MED ORDER — VITAMIN D (ERGOCALCIFEROL) 1.25 MG (50000 UNIT) PO CAPS: 50000.0000 [IU] | ORAL_CAPSULE | ORAL | 0 refills | Status: DC

## 2020-02-15 MED ORDER — LOSARTAN POTASSIUM-HCTZ 50-12.5 MG PO TABS: 1.0000 | ORAL_TABLET | Freq: Every day | ORAL | 1 refills | Status: DC

## 2020-02-15 MED ORDER — ESCITALOPRAM OXALATE 10 MG PO TABS: 10.0000 mg | ORAL_TABLET | Freq: Every day | ORAL | 1 refills | Status: DC

## 2020-02-15 NOTE — Progress Notes (Signed)
Name: Janice Short   MRN: [id number]    DOB: 02/06/1961   Date:02/15/2020       Progress Note  Subjective  Chief Complaint  Chief Complaint  Patient presents with  . Hypertension    follow up , medication refills  . Hyperlipidemia  . Diabetes    I connected with  [name] on 02/15/20 at  9:00 AM EST by telephone and verified that I am speaking with the correct person using two identifiers.  I discussed the limitations, risks, security and privacy concerns of performing an evaluation and management service by telephone and the availability of in person appointments. Staff also discussed with the patient that there may be a patient responsible charge related to this service. Patient Location: at home  Provider Location: [facility]   HPI  DMII: She is on 50 units of Xultophy and also Synajrdi ( currently taking it at night advised to switch to morning ) She states at home has been around 1630 fasting.   She denies polyphagia, polyuria or polydipsia. Last A1C was high at 9.2% and today it was  down to 8.2%, she needs to come in for labs   She has dyslipidemia and neuropathy but stable. Taking gabapentin and statin therapy . She states appetite is not as good lately. She is due for repeat eye exam  MDD: doing better on higher dose of lexapro,phq 9 is positive but she does not want to change medication  Asthma/copd syndrome: still smokes cigarettes intermittently, used to be a heavy smoker, but down to less than 0.25 pack daily now, cough and wheezing has improved, she denies SOB with activity . She is compliant with Trelegy.   HTN: she states she checks her bp at home occasionally and is usually at goal, she denies chest pain or palpitation  Chronic back pain: she has spinal stenosis, never had surgery she has disability. She states pain is daily, very seldom has radiculitis down left side, but no saddle anesthesia. She takes Tylenol and gabapentin   Patient  Active Problem List   Diagnosis Date Noted  . Mild episode of recurrent major depressive disorder ([place]) 02/08/2019  . Dyslipidemia associated with type 2 diabetes mellitus ([place]) 12/17/2017  . Noncompliance with medication regimen 12/17/2017  . Spinal stenosis 10/15/2016  . Diabetic polyneuropathy associated with type 2 diabetes mellitus ([place]) 07/14/2016  . Chronic midline low back pain with sciatica 06/22/2016  . Primary osteoarthritis of both knees 06/22/2016  . Carpal tunnel syndrome 07/01/2015  . Cervical radiculitis 07/01/2015  . Corn or callus 07/01/2015  . Impingement syndrome of shoulder 07/01/2015  . Microalbuminuria 07/01/2015  . Morbid obesity, unspecified obesity type ([place]) 07/01/2015  . Compulsive tobacco user syndrome 07/01/2015  . Tenosynovitis of wrist 07/01/2015  . Benign hypertension 11/05/2009  . Uncontrolled type 2 diabetes mellitus with microalbuminuria ([place]) 11/05/2009  . Athlete's foot 08/05/2009  . Combined fat and carbohydrate induced hyperlipemia 01/08/2009  . Vitamin D deficiency 01/08/2009  . Asthma, mild intermittent 12/28/2008  . Allergic rhinitis 12/28/2008  . Lumbosacral neuritis 08/11/2007    Past Surgical History:  Procedure Laterality Date  . ABDOMINAL HYSTERECTOMY      Family History  Problem Relation Age of Onset  . Heart attack Mother   . CAD Mother   . Kidney disease Father     Social History   Tobacco Use  . Smoking status: Current Some Day Smoker    Packs/day: 0.25    Types: Cigarettes  .  Smokeless tobacco: Never Used  Substance Use Topics  . Alcohol use: No    Alcohol/week: 0.0 standard drinks    Current Outpatient Medications:  .  acetaminophen (TYLENOL) 500 MG tablet, Take 1 tablet by mouth at bedtime as needed., Disp: , Rfl:  .  albuterol (VENTOLIN HFA) 108 (90 Base) MCG/ACT inhaler, INHALE 2 PUFFS BY MOUTH EVERY 6 HOURS AS NEEDED FOR WHEEZING AND FOR SHORTNESS OF BREATH, Disp: 18 g, Rfl: 1 .  aspirin 81 MG tablet, Take  1 tablet by mouth daily., Disp: , Rfl:  .  atorvastatin (LIPITOR) 40 MG tablet, Take 1 tablet (40 mg total) by mouth at bedtime., Disp: 90 tablet, Rfl: 1 .  BAYER MICROLET LANCETS lancets, Use as instructed, Disp: 100 each, Rfl: 12 .  cetirizine (ZYRTEC) 10 MG tablet, Take 10 mg by mouth daily., Disp: , Rfl:  .  Empagliflozin-metFORMIN HCl ER (SYNJARDY XR) 12.04-999 MG TB24, Take 2 tablets by mouth daily. New dose, Disp: 180 tablet, Rfl: 1 .  escitalopram (LEXAPRO) 10 MG tablet, Take 1 tablet (10 mg total) by mouth daily., Disp: 90 tablet, Rfl: 1 .  fluticasone (FLONASE) 50 MCG/ACT nasal spray, USE 2 SPRAY(S) IN EACH NOSTRIL AT BEDTIME, Disp: 48 g, Rfl: 0 .  gabapentin (NEURONTIN) 300 MG capsule, Take 2 capsules by mouth twice daily, Disp: 180 capsule, Rfl: 0 .  glucose blood (BAYER CONTOUR TEST) test strip, Use as instructed, Disp: 100 each, Rfl: 2 .  Insulin Pen Needle (NOVOFINE) 32G X 6 MM MISC, 1 each by Does not apply route daily., Disp: 100 each, Rfl: 1 .  losartan-hydrochlorothiazide (HYZAAR) 50-12.5 MG tablet, Take 1 tablet by mouth daily., Disp: 90 tablet, Rfl: 1 .  TRELEGY ELLIPTA 100-62.5-25 MCG/INH AEPB, INHALE 1 PUFF ONCE DAILY, Disp: 180 each, Rfl: 0 .  XULTOPHY 100-3.6 UNIT-MG/ML SOPN, INJECT 50 UNITS SUBCUTANEOUSLY ONCE DAILY, Disp: 15 mL, Rfl: 0 .  Vitamin D, Ergocalciferol, (DRISDOL) 1.25 MG (50000 UNIT) CAPS capsule, Take 1 capsule (50,000 Units total) by mouth every 7 (seven) days., Disp: 12 capsule, Rfl: 0  Allergies  Allergen Reactions  . Other Shortness Of Breath    Cat dander and grass pollen  . Bydureon [Exenatide] Other (See Comments)    Pain with injection not allergy    I personally reviewed active problem list, medication list, allergies, family history, social history, health maintenance with the patient/caregiver today.   ROS  Ten systems reviewed and is negative except as mentioned in HPI   Objective  Virtual encounter, vitals not obtained.  There is  no height or weight on file to calculate BMI.  Physical Exam  Awake, alert and oriented   PHQ2/9: Depression screen [facility] 2/9 02/15/2020 01/30/2020 09/01/2019 06/01/2019 03/13/2019  Decreased Interest 1 0 0 0 0  Down, Depressed, Hopeless 1 0 0 1 2  PHQ - 2 Score 2 0 0 1 2  Altered sleeping 0 - 0 0 1  Tired, decreased energy 0 - 0 0 0  Change in appetite 3 - 0 0 2  Feeling bad or failure about yourself  0 - 0 0 0  Trouble concentrating 0 - 0 0 0  Moving slowly or fidgety/restless 0 - 0 0 0  Suicidal thoughts 0 - 0 0 0  PHQ-9 Score 5 - 0 1 5  Difficult doing work/chores - - Not difficult at all - Not difficult at all   PHQ-2/9 Result is positive, she sates just isolated because of pandemic, waiting for her vaccine  does not want more medication  Fall Risk: Fall Risk  02/15/2020 01/30/2020 09/01/2019 06/01/2019 02/08/2019  Falls in the past year? 0 0 0 0 0  Number falls in past yr: 0 0 0 0 0  Injury with Fall? 0 0 0 0 0  Comment - - - - -  Risk for fall due to : - No Fall Risks - - -  Follow up Falls evaluation completed Falls prevention discussed - - Falls evaluation completed     Assessment & Plan  1. Benign hypertension  - COMPLETE METABOLIC PANEL WITH GFR - CBC with Differential/Platelet - losartan-hydrochlorothiazide (HYZAAR) 50-12.5 MG tablet; Take 1 tablet by mouth daily.  Dispense: 90 tablet; Refill: 1  2. Uncontrolled type 2 diabetes mellitus with microalbuminuria (HCC)  - Empagliflozin-metFORMIN HCl ER (SYNJARDY XR) 12.04-999 MG TB24; Take 2 tablets by mouth daily. New dose  Dispense: 180 tablet; Refill: 1  3. Dyslipidemia associated with type 2 diabetes mellitus (HCC)  - Lipid panel - Hemoglobin A1c - Empagliflozin-metFORMIN HCl ER (SYNJARDY XR) 12.04-999 MG TB24; Take 2 tablets by mouth daily. New dose  Dispense: 180 tablet; Refill: 1 - atorvastatin (LIPITOR) 40 MG tablet; Take 1 tablet (40 mg total) by mouth at bedtime.  Dispense: 90 tablet; Refill: 1  4. Major  depression in remission ([place])  - escitalopram (LEXAPRO) 10 MG tablet; Take 1 tablet (10 mg total) by mouth daily.  Dispense: 90 tablet; Refill: 1  5. Mild intermittent asthma without complication  - albuterol (VENTOLIN HFA) 108 (90 Base) MCG/ACT inhaler; INHALE 2 PUFFS BY MOUTH EVERY 6 HOURS AS NEEDED FOR WHEEZING AND FOR SHORTNESS OF BREATH  Dispense: 18 g; Refill: 1  6. Vitamin D deficiency  - Vitamin D, Ergocalciferol, (DRISDOL) 1.25 MG (50000 UNIT) CAPS capsule; Take 1 capsule (50,000 Units total) by mouth every 7 (seven) days.  Dispense: 12 capsule; Refill: 0   I discussed the assessment and treatment plan with the patient. The patient was provided an opportunity to ask questions and all were answered. The patient agreed with the plan and demonstrated an understanding of the instructions.   The patient was advised to call back or seek an in-person evaluation if the symptoms worsen or if the condition fails to improve as anticipated.  I provided 25  minutes of non-face-to-face time during this encounter.  Loistine Chance, MD

## 2020-02-16 DIAGNOSIS — I10 Essential (primary) hypertension: Secondary | ICD-10-CM | POA: Diagnosis not present

## 2020-02-16 DIAGNOSIS — E785 Hyperlipidemia, unspecified: Secondary | ICD-10-CM | POA: Diagnosis not present

## 2020-02-16 DIAGNOSIS — E1169 Type 2 diabetes mellitus with other specified complication: Secondary | ICD-10-CM | POA: Diagnosis not present

## 2020-02-17 LAB — LIPID PANEL
Cholesterol: 552 mg/dL — ABNORMAL HIGH (ref <200)
HDL: 34 mg/dL — ABNORMAL LOW (ref >=50)
LDL Cholesterol (Calc): >350 mg/dL (calc) — ABNORMAL HIGH
Non-HDL Cholesterol (Calc): 518 mg/dL (calc) — ABNORMAL HIGH (ref <130)
Total CHOL/HDL Ratio: 16.2 (calc) — ABNORMAL HIGH (ref <5.0)
Triglycerides: 249 mg/dL — ABNORMAL HIGH (ref <150)

## 2020-02-17 LAB — COMPLETE METABOLIC PANEL WITH GFR
AG Ratio: 0.9 (calc) — ABNORMAL LOW (ref 1.0–2.5)
ALT: 65 U/L — ABNORMAL HIGH (ref 6–29)
AST: 58 U/L — ABNORMAL HIGH (ref 10–35)
Albumin: 3.5 g/dL — ABNORMAL LOW (ref 3.6–5.1)
Alkaline phosphatase (APISO): 691 U/L — ABNORMAL HIGH (ref 37–153)
BUN/Creatinine Ratio: 21 (calc) (ref 6–22)
BUN: 27 mg/dL — ABNORMAL HIGH (ref 7–25)
CO2: 23 mmol/L (ref 20–32)
Calcium: 9.3 mg/dL (ref 8.6–10.4)
Chloride: 106 mmol/L (ref 98–110)
Creat: 1.29 mg/dL — ABNORMAL HIGH (ref 0.50–0.99)
GFR, Est African American: 52 mL/min/{1.73_m2} — ABNORMAL LOW (ref >=60)
GFR, Est Non African American: 45 mL/min/{1.73_m2} — ABNORMAL LOW (ref >=60)
Globulin: 4.1 g/dL (calc) — ABNORMAL HIGH (ref 1.9–3.7)
Glucose, Bld: 78 mg/dL (ref 65–99)
Potassium: 5.2 mmol/L (ref 3.5–5.3)
Sodium: 137 mmol/L (ref 135–146)
Total Bilirubin: 2.2 mg/dL — ABNORMAL HIGH (ref 0.2–1.2)
Total Protein: 7.6 g/dL (ref 6.1–8.1)

## 2020-02-17 LAB — CBC WITH DIFFERENTIAL/PLATELET
Absolute Monocytes: 683 cells/uL (ref 200–950)
Basophils Absolute: 73 cells/uL (ref 0–200)
Basophils Relative: 0.8 %
Eosinophils Absolute: 255 cells/uL (ref 15–500)
Eosinophils Relative: 2.8 %
HCT: 38.7 % (ref 35.0–45.0)
Hemoglobin: 12.8 g/dL (ref 11.7–15.5)
Lymphs Abs: 2503 cells/uL (ref 850–3900)
MCH: 31.9 pg (ref 27.0–33.0)
MCHC: 33.1 g/dL (ref 32.0–36.0)
MCV: 96.5 fL (ref 80.0–100.0)
MPV: 11.2 fL (ref 7.5–12.5)
Monocytes Relative: 7.5 %
Neutro Abs: 5587 cells/uL (ref 1500–7800)
Neutrophils Relative %: 61.4 %
Platelets: 377 10*3/uL (ref 140–400)
RBC: 4.01 10*6/uL (ref 3.80–5.10)
RDW: 13.3 % (ref 11.0–15.0)
Total Lymphocyte: 27.5 %
WBC: 9.1 10*3/uL (ref 3.8–10.8)

## 2020-02-17 LAB — HEMOGLOBIN A1C
Hgb A1c MFr Bld: 6 % of total Hgb — ABNORMAL HIGH (ref <5.7)
Mean Plasma Glucose: 126 (calc)
eAG (mmol/L): 7 (calc)

## 2020-02-21 ENCOUNTER — Other Ambulatory Visit: Payer: Self-pay | Admitting: Family Medicine

## 2020-02-21 ENCOUNTER — Telehealth: Payer: Self-pay

## 2020-02-21 DIAGNOSIS — R945 Abnormal results of liver function studies: Secondary | ICD-10-CM

## 2020-02-21 DIAGNOSIS — R7989 Other specified abnormal findings of blood chemistry: Secondary | ICD-10-CM

## 2020-02-21 MED ORDER — REPATHA 140 MG/ML ~~LOC~~ SOSY: 140.0000 mg | PREFILLED_SYRINGE | SUBCUTANEOUS | 5 refills | Status: DC

## 2020-02-21 NOTE — Telephone Encounter (Signed)
I contacted this patient to let her know that she has been scheduled to have her U/S on 02/27/2020 at 8:30am but there was no answer. A message was left for her with this information on her voicemail and asking her to arrive at 8am at the [place] entrance at [facility]. I also mentioned that she is not to have anything to eat or drink after midnight prior to her imaging.   She was asked to give [facility] a call at [phone] if she had any additional questions or concerns and she was given the number to centralized scheduling [phone]) in case the appt date and time given was not suitable for her.

## 2020-02-26 ENCOUNTER — Other Ambulatory Visit: Payer: Self-pay | Admitting: Family Medicine

## 2020-02-26 DIAGNOSIS — G8929 Other chronic pain: Secondary | ICD-10-CM

## 2020-02-26 DIAGNOSIS — E1142 Type 2 diabetes mellitus with diabetic polyneuropathy: Secondary | ICD-10-CM

## 2020-02-26 DIAGNOSIS — M4807 Spinal stenosis, lumbosacral region: Secondary | ICD-10-CM

## 2020-02-27 ENCOUNTER — Ambulatory Visit: Admission: RE | Admit: 2020-02-27 | Payer: Medicare HMO | Source: Ambulatory Visit

## 2020-02-27 ENCOUNTER — Telehealth: Payer: Self-pay

## 2020-02-27 NOTE — Telephone Encounter (Signed)
Copied from [place] [phone]. Topic: General - Other >> Feb 27, 2020  9:50 AM Virl Axe D wrote: Reason for CRM: [facility] [place] department stated pt did not show up for her appt today.

## 2020-03-07 ENCOUNTER — Other Ambulatory Visit: Payer: Self-pay | Admitting: Family Medicine

## 2020-03-07 DIAGNOSIS — E1129 Type 2 diabetes mellitus with other diabetic kidney complication: Secondary | ICD-10-CM

## 2020-03-07 DIAGNOSIS — E1142 Type 2 diabetes mellitus with diabetic polyneuropathy: Secondary | ICD-10-CM

## 2020-03-07 DIAGNOSIS — E785 Hyperlipidemia, unspecified: Secondary | ICD-10-CM

## 2020-03-07 DIAGNOSIS — E1169 Type 2 diabetes mellitus with other specified complication: Secondary | ICD-10-CM

## 2020-03-07 DIAGNOSIS — IMO0002 Reserved for concepts with insufficient information to code with codable children: Secondary | ICD-10-CM

## 2020-03-14 ENCOUNTER — Other Ambulatory Visit: Payer: Self-pay | Admitting: Family Medicine

## 2020-03-14 DIAGNOSIS — E1142 Type 2 diabetes mellitus with diabetic polyneuropathy: Secondary | ICD-10-CM

## 2020-03-14 DIAGNOSIS — E1129 Type 2 diabetes mellitus with other diabetic kidney complication: Secondary | ICD-10-CM

## 2020-03-14 DIAGNOSIS — IMO0002 Reserved for concepts with insufficient information to code with codable children: Secondary | ICD-10-CM

## 2020-04-10 ENCOUNTER — Other Ambulatory Visit: Payer: Self-pay | Admitting: Family Medicine

## 2020-04-10 DIAGNOSIS — E1169 Type 2 diabetes mellitus with other specified complication: Secondary | ICD-10-CM

## 2020-04-10 DIAGNOSIS — E1142 Type 2 diabetes mellitus with diabetic polyneuropathy: Secondary | ICD-10-CM

## 2020-04-10 DIAGNOSIS — E1129 Type 2 diabetes mellitus with other diabetic kidney complication: Secondary | ICD-10-CM

## 2020-04-10 DIAGNOSIS — IMO0002 Reserved for concepts with insufficient information to code with codable children: Secondary | ICD-10-CM

## 2020-04-10 DIAGNOSIS — E785 Hyperlipidemia, unspecified: Secondary | ICD-10-CM

## 2020-04-10 MED ORDER — NOVOFINE 32G X 6 MM MISC: 0 refills | Status: DC

## 2020-04-10 NOTE — Telephone Encounter (Signed)
Requested Prescriptions  Pending Prescriptions Disp Refills  . Insulin Pen Needle (NOVOFINE) 32G X 6 MM MISC 100 each 0    Sig: USE 1  ONCE DAILY     Endocrinology: Diabetes - Testing Supplies Passed - 04/10/2020 11:54 AM      Passed - Valid encounter within last 12 months    Recent Outpatient Visits          1 month ago Vitamin D deficiency   [facility] [place], Drue Stager, MD   7 months ago Uncontrolled type 2 diabetes mellitus with microalbuminuria [facility])   [facility] Steele Sizer, MD   10 months ago Mild episode of recurrent major depressive disorder [facility])   [facility] Steele Sizer, MD   1 year ago Mild episode of recurrent major depressive disorder [facility])   [place], FNP   1 year ago Mild episode of recurrent major depressive disorder [facility])   [place], FNP      Future Appointments            In 3 months Ancil Boozer, Drue Stager, MD [facility], [place]   In 9 months  [facility], [place]           . Empagliflozin-metFORMIN HCl ER (SYNJARDY XR) 12.04-999 MG TB24 180 tablet 1    Sig: Take 2 tablets by mouth daily. New dose     Endocrinology:  Diabetes - Biguanide + SGLT2 Inhibitor Combos Failed - 04/10/2020 11:54 AM      Failed - Cr in normal range and within 360 days    Creat  Date Value Ref Range Status  02/16/2020 1.29 (H) 0.50 - 0.99 mg/dL Final    Comment:    For patients >30 years of age, the reference limit for Creatinine is approximately 13% higher for people identified as African-American. .    Creatinine, Urine  Date Value Ref Range Status  09/01/2019 152 20 - 275 mg/dL Final         Failed - LDL in normal range and within 360 days    LDL Cholesterol (Calc)  Date Value Ref Range Status  02/16/2020 >350 (H) mg/dL (calc) Final    Comment:    LDL-C levels > or = 190 mg/dL may indicate familial   hypercholesterolemia (FH). Clinical assessment and  measurement of blood lipid levels should be  considered for all first degree relatives of  patients with an FH diagnosis.  For questions about testing for familial hypercholesterolemia, please call Insurance risk surveyor at [email or web address]. Duncan Dull, et al. J National Lipid Association  Recommendations for Patient-Centered Management of  Dyslipidemia: Part 1 Journal of Clinical Lipidology  2015;9(2), 129-169. Reference range: <100 . Desirable range <100 mg/dL for primary prevention;   <70 mg/dL for patients with CHD or diabetic patients  with > or = 2 CHD risk factors. Marland Kitchen LDL-C is now calculated using the Martin-Hopkins  calculation, which is a validated novel method providing  better accuracy than the Friedewald equation in the  estimation of LDL-C.  Cresenciano Genre et al. Annamaria Helling. 9924;268(34): 2061-2068  (http://education.QuestDiagnostics.com/faq/FAQ164)          Failed - eGFR in normal range and within 360 days    GFR, Est African American  Date Value Ref Range Status  02/16/2020 52 (L) > OR = 60 mL/min/1.38m Final   GFR, Est Non African American  Date Value Ref Range Status  02/16/2020 45 (L) > OR = 60 mL/min/1.29m Final         Passed - HBA1C is between 0 and 7.9 and within 180 days    HbA1c, POC (controlled diabetic range)  Date Value Ref Range Status  09/01/2019 8.2 (A) 0.0 - 7.0 % Final   Hgb A1c MFr Bld  Date Value Ref Range Status  02/16/2020 6.0 (H) <5.7 % of total Hgb Final    Comment:    For someone without known diabetes, a hemoglobin  A1c value between 5.7% and 6.4% is consistent with prediabetes and should be confirmed with a  follow-up test. . For someone with known diabetes, a value <7% indicates that their diabetes is well controlled. A1c targets should be individualized based on duration of diabetes, age, comorbid conditions, and other considerations. . This assay result is consistent  with an increased risk of diabetes. . Currently, no consensus exists regarding use of hemoglobin A1c for diagnosis of diabetes for children. .Renella Cunas- Valid encounter within last 6 months    Recent Outpatient Visits          1 month ago [facility] D [facility]   [facility]   7 months ago Uncontrolled type 2 diabetes mellitus with microalbuminuria ([facility]   [facility]   10 months ago Mild episode of recurrent major depressive disorder ([facility]   [facility]   1 year ago Mild episode of recurrent major depressive disorder ([facility]   CLeona FNP   1 year ago Mild episode of recurrent major depressive disorder ([facility]   CH[place] FNP      Future Appointments            In 3 months SSteele Sizer MD [facility]  In 9 months  [facility]

## 2020-04-10 NOTE — Telephone Encounter (Signed)
Pt needs refill on novofine 32 g x 6 mm needles and empagliflozin-metformin 12.04-999 mg. [facility] [address] in [place]. Pt said the pharm is not answering their phone

## 2020-04-22 ENCOUNTER — Other Ambulatory Visit: Payer: Self-pay | Admitting: Family Medicine

## 2020-04-22 DIAGNOSIS — IMO0002 Reserved for concepts with insufficient information to code with codable children: Secondary | ICD-10-CM

## 2020-04-22 DIAGNOSIS — E785 Hyperlipidemia, unspecified: Secondary | ICD-10-CM

## 2020-04-22 DIAGNOSIS — E1142 Type 2 diabetes mellitus with diabetic polyneuropathy: Secondary | ICD-10-CM

## 2020-04-22 NOTE — Telephone Encounter (Signed)
Requested Prescriptions  Pending Prescriptions Disp Refills  . XULTOPHY 100-3.6 UNIT-MG/ML SOPN [Pharmacy Med Name: Xultophy 100-3.6 UNIT-MG/ML Subcutaneous Solution Pen-injector] 15 mL 0    Sig: INJECT 50 UNITS  SUBCUTANEOUSLY ONCE DAILY     Endocrinology:  Diabetes - Insulin + GLP-1 Receptor Agonist Combos 1 Passed - 04/22/2020  9:52 AM      Passed - HBA1C is between 0 and 7.9 and within 180 days    HbA1c, POC (controlled diabetic range)  Date Value Ref Range Status  09/01/2019 8.2 (A) 0.0 - 7.0 % Final   Hgb A1c MFr Bld  Date Value Ref Range Status  02/16/2020 6.0 (H) <5.7 % of total Hgb Final    Comment:    For someone without known diabetes, a hemoglobin  A1c value between 5.7% and 6.4% is consistent with prediabetes and should be confirmed with a  follow-up test. . For someone with known diabetes, a value <7% indicates that their diabetes is well controlled. A1c targets should be individualized based on duration of diabetes, age, comorbid conditions, and other considerations. . This assay result is consistent with an increased risk of diabetes. . Currently, no consensus exists regarding use of hemoglobin A1c for diagnosis of diabetes for children. Renella Cunas - Valid encounter within last 6 months    Recent Outpatient Visits          2 months ago Vitamin D deficiency   [facility] [place], Drue Stager, MD   7 months ago Uncontrolled type 2 diabetes mellitus with microalbuminuria [facility])   [facility] Steele Sizer, MD   10 months ago Mild episode of recurrent major depressive disorder [facility])   [facility] Steele Sizer, MD   1 year ago Mild episode of recurrent major depressive disorder [facility])   [place], FNP   1 year ago Mild episode of recurrent major depressive disorder [facility])   [place], FNP      Future Appointments        In 2 months Steele Sizer, MD [facility], [place]   In 9 months  [facility], [facility]

## 2020-05-10 ENCOUNTER — Other Ambulatory Visit: Payer: Self-pay | Admitting: Family Medicine

## 2020-05-10 DIAGNOSIS — M5440 Lumbago with sciatica, unspecified side: Secondary | ICD-10-CM

## 2020-05-10 DIAGNOSIS — E1142 Type 2 diabetes mellitus with diabetic polyneuropathy: Secondary | ICD-10-CM

## 2020-05-10 DIAGNOSIS — M4807 Spinal stenosis, lumbosacral region: Secondary | ICD-10-CM

## 2020-05-17 ENCOUNTER — Ambulatory Visit: Payer: Medicare HMO | Admitting: Family Medicine

## 2020-06-10 ENCOUNTER — Other Ambulatory Visit: Payer: Self-pay | Admitting: Family Medicine

## 2020-06-10 DIAGNOSIS — E1142 Type 2 diabetes mellitus with diabetic polyneuropathy: Secondary | ICD-10-CM

## 2020-06-10 DIAGNOSIS — IMO0002 Reserved for concepts with insufficient information to code with codable children: Secondary | ICD-10-CM

## 2020-06-10 DIAGNOSIS — E785 Hyperlipidemia, unspecified: Secondary | ICD-10-CM

## 2020-07-10 ENCOUNTER — Encounter: Payer: Medicare HMO | Admitting: Family Medicine

## 2020-07-23 ENCOUNTER — Other Ambulatory Visit: Payer: Self-pay | Admitting: Family Medicine

## 2020-07-23 DIAGNOSIS — E785 Hyperlipidemia, unspecified: Secondary | ICD-10-CM

## 2020-07-23 DIAGNOSIS — E1129 Type 2 diabetes mellitus with other diabetic kidney complication: Secondary | ICD-10-CM

## 2020-07-23 DIAGNOSIS — M5440 Lumbago with sciatica, unspecified side: Secondary | ICD-10-CM

## 2020-07-23 DIAGNOSIS — IMO0002 Reserved for concepts with insufficient information to code with codable children: Secondary | ICD-10-CM

## 2020-07-23 DIAGNOSIS — I10 Essential (primary) hypertension: Secondary | ICD-10-CM

## 2020-07-23 DIAGNOSIS — E1169 Type 2 diabetes mellitus with other specified complication: Secondary | ICD-10-CM

## 2020-07-23 DIAGNOSIS — M4807 Spinal stenosis, lumbosacral region: Secondary | ICD-10-CM

## 2020-07-23 DIAGNOSIS — E1142 Type 2 diabetes mellitus with diabetic polyneuropathy: Secondary | ICD-10-CM

## 2020-07-23 DIAGNOSIS — G8929 Other chronic pain: Secondary | ICD-10-CM

## 2020-09-17 ENCOUNTER — Other Ambulatory Visit: Payer: Self-pay | Admitting: Family Medicine

## 2020-09-17 DIAGNOSIS — E1129 Type 2 diabetes mellitus with other diabetic kidney complication: Secondary | ICD-10-CM

## 2020-09-17 DIAGNOSIS — IMO0002 Reserved for concepts with insufficient information to code with codable children: Secondary | ICD-10-CM

## 2020-09-17 DIAGNOSIS — E1169 Type 2 diabetes mellitus with other specified complication: Secondary | ICD-10-CM

## 2020-09-17 DIAGNOSIS — E1142 Type 2 diabetes mellitus with diabetic polyneuropathy: Secondary | ICD-10-CM

## 2020-09-17 NOTE — Telephone Encounter (Signed)
Called pt and LM on VM to call office to schedule appt for f/u and blood work  Last RF: 07/23/20 #15 RF due: yes Active med list: yes Future visit scheduled: no  Routing to [facility].

## 2020-09-18 ENCOUNTER — Other Ambulatory Visit: Payer: Self-pay

## 2020-09-30 ENCOUNTER — Telehealth: Payer: Self-pay | Admitting: Family Medicine

## 2020-09-30 DIAGNOSIS — IMO0002 Reserved for concepts with insufficient information to code with codable children: Secondary | ICD-10-CM

## 2020-09-30 DIAGNOSIS — E1165 Type 2 diabetes mellitus with hyperglycemia: Secondary | ICD-10-CM

## 2020-09-30 DIAGNOSIS — E1142 Type 2 diabetes mellitus with diabetic polyneuropathy: Secondary | ICD-10-CM

## 2020-09-30 DIAGNOSIS — E1169 Type 2 diabetes mellitus with other specified complication: Secondary | ICD-10-CM

## 2020-10-01 NOTE — Telephone Encounter (Signed)
lvm to schedule appointment

## 2020-10-08 ENCOUNTER — Ambulatory Visit: Payer: Medicare HMO | Admitting: Internal Medicine

## 2020-10-14 DIAGNOSIS — E119 Type 2 diabetes mellitus without complications: Secondary | ICD-10-CM | POA: Diagnosis not present

## 2020-10-14 DIAGNOSIS — Z720 Tobacco use: Secondary | ICD-10-CM | POA: Diagnosis not present

## 2020-10-14 DIAGNOSIS — M1990 Unspecified osteoarthritis, unspecified site: Secondary | ICD-10-CM | POA: Diagnosis not present

## 2020-10-14 DIAGNOSIS — G8929 Other chronic pain: Secondary | ICD-10-CM | POA: Diagnosis not present

## 2020-10-14 DIAGNOSIS — R69 Illness, unspecified: Secondary | ICD-10-CM | POA: Diagnosis not present

## 2020-10-14 DIAGNOSIS — Z794 Long term (current) use of insulin: Secondary | ICD-10-CM | POA: Diagnosis not present

## 2020-10-14 DIAGNOSIS — E785 Hyperlipidemia, unspecified: Secondary | ICD-10-CM | POA: Diagnosis not present

## 2020-10-14 DIAGNOSIS — I10 Essential (primary) hypertension: Secondary | ICD-10-CM | POA: Diagnosis not present

## 2020-10-14 DIAGNOSIS — Z008 Encounter for other general examination: Secondary | ICD-10-CM | POA: Diagnosis not present

## 2020-10-14 DIAGNOSIS — J309 Allergic rhinitis, unspecified: Secondary | ICD-10-CM | POA: Diagnosis not present

## 2020-10-14 DIAGNOSIS — J449 Chronic obstructive pulmonary disease, unspecified: Secondary | ICD-10-CM | POA: Diagnosis not present

## 2020-10-29 ENCOUNTER — Telehealth: Payer: Self-pay

## 2020-10-29 NOTE — Telephone Encounter (Signed)
Left voice message for pt to schedule appt with dr Ancil Boozer Copied from [place] [phone]. Topic: General - Other >> Oct 29, 2020 11:00 AM Antonieta Iba C wrote: Reason for CRM: pt called in for assistance. Pt say that she had a nurse come out to her home to complete her yearly exam. Pt says that she was told to just have labs completed. Pt would like assistance.   [phone] -

## 2020-11-05 ENCOUNTER — Other Ambulatory Visit: Payer: Self-pay

## 2020-11-05 ENCOUNTER — Telehealth: Payer: Self-pay

## 2020-11-05 DIAGNOSIS — IMO0002 Reserved for concepts with insufficient information to code with codable children: Secondary | ICD-10-CM

## 2020-11-05 DIAGNOSIS — E1169 Type 2 diabetes mellitus with other specified complication: Secondary | ICD-10-CM

## 2020-11-05 DIAGNOSIS — E1165 Type 2 diabetes mellitus with hyperglycemia: Secondary | ICD-10-CM

## 2020-11-05 DIAGNOSIS — M4807 Spinal stenosis, lumbosacral region: Secondary | ICD-10-CM

## 2020-11-05 DIAGNOSIS — F325 Major depressive disorder, single episode, in full remission: Secondary | ICD-10-CM

## 2020-11-05 DIAGNOSIS — E1142 Type 2 diabetes mellitus with diabetic polyneuropathy: Secondary | ICD-10-CM

## 2020-11-05 DIAGNOSIS — G8929 Other chronic pain: Secondary | ICD-10-CM

## 2020-11-05 NOTE — Telephone Encounter (Signed)
Copied from [place] [phone]. Topic: General - Other >> Nov 05, 2020  9:59 AM Hinda Lenis D wrote: PT need a refill on all her meds, she schedule for January 18th / please call her back

## 2020-11-09 ENCOUNTER — Other Ambulatory Visit: Payer: Self-pay | Admitting: Family Medicine

## 2020-11-09 DIAGNOSIS — G8929 Other chronic pain: Secondary | ICD-10-CM

## 2020-11-09 DIAGNOSIS — E1169 Type 2 diabetes mellitus with other specified complication: Secondary | ICD-10-CM

## 2020-11-09 DIAGNOSIS — IMO0002 Reserved for concepts with insufficient information to code with codable children: Secondary | ICD-10-CM

## 2020-11-09 DIAGNOSIS — E1129 Type 2 diabetes mellitus with other diabetic kidney complication: Secondary | ICD-10-CM

## 2020-11-09 DIAGNOSIS — M5440 Lumbago with sciatica, unspecified side: Secondary | ICD-10-CM

## 2020-11-09 DIAGNOSIS — E1142 Type 2 diabetes mellitus with diabetic polyneuropathy: Secondary | ICD-10-CM

## 2020-11-09 DIAGNOSIS — M4807 Spinal stenosis, lumbosacral region: Secondary | ICD-10-CM

## 2020-11-09 NOTE — Telephone Encounter (Signed)
Requested Prescriptions  Pending Prescriptions Disp Refills  . gabapentin (NEURONTIN) 300 MG capsule [Pharmacy Med Name: Gabapentin 300 MG Oral Capsule] 360 capsule 0    Sig: Take 2 capsules by mouth twice daily     Neurology: Anticonvulsants - gabapentin Passed - 11/09/2020  9:45 AM      Passed - Valid encounter within last 12 months    Recent Outpatient Visits          8 months ago Vitamin D deficiency   [facility] [place], Drue Stager, MD   1 year ago Uncontrolled type 2 diabetes mellitus with microalbuminuria [facility])   [facility] Steele Sizer, MD   1 year ago Mild episode of recurrent major depressive disorder [facility])   [facility] Steele Sizer, MD   1 year ago Mild episode of recurrent major depressive disorder [facility])   [place], FNP   1 year ago Mild episode of recurrent major depressive disorder [facility])   [place], [place], FNP      Future Appointments            In 1 month Ancil Boozer, Drue Stager, MD [facility], [place]   In 2 months  [place], [place]           . XULTOPHY 100-3.6 UNIT-MG/ML SOPN [Pharmacy Med Name: Xultophy 100-3.6 UNIT-MG/ML Subcutaneous Solution Pen-injector] 15 mL 0    Sig: INJECT 50 UNITS SUBCUTANEOUSLY ONCE DAILY     Endocrinology:  Diabetes - Insulin + GLP-1 Receptor Agonist Combos 1 Failed - 11/09/2020  9:45 AM      Failed - HBA1C is between 0 and 7.9 and within 180 days    HbA1c, POC (controlled diabetic range)  Date Value Ref Range Status  09/01/2019 8.2 (A) 0.0 - 7.0 % Final   Hgb A1c MFr Bld  Date Value Ref Range Status  02/16/2020 6.0 (H) <5.7 % of total Hgb Final    Comment:    For someone without known diabetes, a hemoglobin  A1c value between 5.7% and 6.4% is consistent with prediabetes and should be confirmed with a  follow-up test. . For someone with known diabetes, a value  <7% indicates that their diabetes is well controlled. A1c targets should be individualized based on duration of diabetes, age, comorbid conditions, and other considerations. . This assay result is consistent with an increased risk of diabetes. . Currently, no consensus exists regarding use of hemoglobin A1c for diagnosis of diabetes for children. .          Failed - Valid encounter within last 6 months    Recent Outpatient Visits          8 months ago Vitamin D deficiency   [facility] [place], Drue Stager, MD   1 year ago Uncontrolled type 2 diabetes mellitus with microalbuminuria [facility])   [facility] Steele Sizer, MD   1 year ago Mild episode of recurrent major depressive disorder [facility])   [facility] Steele Sizer, MD   1 year ago Mild episode of recurrent major depressive disorder [facility])   [place], FNP   1 year ago Mild episode of recurrent major depressive disorder [facility])   [place], FNP      Future Appointments            In 1 month Steele Sizer, MD  [facility], [place]   In 2 months  [place]

## 2020-12-16 ENCOUNTER — Other Ambulatory Visit: Payer: Self-pay | Admitting: Family Medicine

## 2020-12-16 DIAGNOSIS — I10 Essential (primary) hypertension: Secondary | ICD-10-CM

## 2020-12-24 ENCOUNTER — Ambulatory Visit: Payer: Medicare HMO | Admitting: Family Medicine

## 2020-12-26 ENCOUNTER — Ambulatory Visit (INDEPENDENT_AMBULATORY_CARE_PROVIDER_SITE_OTHER): Payer: Medicare HMO | Admitting: Family Medicine

## 2020-12-26 ENCOUNTER — Encounter: Payer: Self-pay | Admitting: Family Medicine

## 2020-12-26 ENCOUNTER — Other Ambulatory Visit: Payer: Self-pay

## 2020-12-26 VITALS — BP 138/70 | HR 99 | Temp 98.0°F | Resp 16 | Ht 66.0 in | Wt 166.0 lb

## 2020-12-26 DIAGNOSIS — Z23 Encounter for immunization: Secondary | ICD-10-CM

## 2020-12-26 DIAGNOSIS — J449 Chronic obstructive pulmonary disease, unspecified: Secondary | ICD-10-CM

## 2020-12-26 DIAGNOSIS — E1169 Type 2 diabetes mellitus with other specified complication: Secondary | ICD-10-CM | POA: Diagnosis not present

## 2020-12-26 DIAGNOSIS — J302 Other seasonal allergic rhinitis: Secondary | ICD-10-CM

## 2020-12-26 DIAGNOSIS — E1129 Type 2 diabetes mellitus with other diabetic kidney complication: Secondary | ICD-10-CM

## 2020-12-26 DIAGNOSIS — I152 Hypertension secondary to endocrine disorders: Secondary | ICD-10-CM

## 2020-12-26 DIAGNOSIS — Z1211 Encounter for screening for malignant neoplasm of colon: Secondary | ICD-10-CM

## 2020-12-26 DIAGNOSIS — I10 Essential (primary) hypertension: Secondary | ICD-10-CM

## 2020-12-26 DIAGNOSIS — M5440 Lumbago with sciatica, unspecified side: Secondary | ICD-10-CM

## 2020-12-26 DIAGNOSIS — E559 Vitamin D deficiency, unspecified: Secondary | ICD-10-CM | POA: Diagnosis not present

## 2020-12-26 DIAGNOSIS — E1165 Type 2 diabetes mellitus with hyperglycemia: Secondary | ICD-10-CM | POA: Diagnosis not present

## 2020-12-26 DIAGNOSIS — R799 Abnormal finding of blood chemistry, unspecified: Secondary | ICD-10-CM | POA: Diagnosis not present

## 2020-12-26 DIAGNOSIS — Z122 Encounter for screening for malignant neoplasm of respiratory organs: Secondary | ICD-10-CM

## 2020-12-26 DIAGNOSIS — G8929 Other chronic pain: Secondary | ICD-10-CM

## 2020-12-26 DIAGNOSIS — E1159 Type 2 diabetes mellitus with other circulatory complications: Secondary | ICD-10-CM | POA: Diagnosis not present

## 2020-12-26 DIAGNOSIS — R809 Proteinuria, unspecified: Secondary | ICD-10-CM | POA: Diagnosis not present

## 2020-12-26 DIAGNOSIS — E440 Moderate protein-calorie malnutrition: Secondary | ICD-10-CM

## 2020-12-26 DIAGNOSIS — R69 Illness, unspecified: Secondary | ICD-10-CM | POA: Diagnosis not present

## 2020-12-26 DIAGNOSIS — E1142 Type 2 diabetes mellitus with diabetic polyneuropathy: Secondary | ICD-10-CM | POA: Diagnosis not present

## 2020-12-26 DIAGNOSIS — J3089 Other allergic rhinitis: Secondary | ICD-10-CM | POA: Diagnosis not present

## 2020-12-26 DIAGNOSIS — F325 Major depressive disorder, single episode, in full remission: Secondary | ICD-10-CM

## 2020-12-26 DIAGNOSIS — M4807 Spinal stenosis, lumbosacral region: Secondary | ICD-10-CM

## 2020-12-26 DIAGNOSIS — R7982 Elevated C-reactive protein (CRP): Secondary | ICD-10-CM | POA: Diagnosis not present

## 2020-12-26 DIAGNOSIS — IMO0002 Reserved for concepts with insufficient information to code with codable children: Secondary | ICD-10-CM

## 2020-12-26 DIAGNOSIS — E785 Hyperlipidemia, unspecified: Secondary | ICD-10-CM | POA: Diagnosis not present

## 2020-12-26 LAB — POCT GLYCOSYLATED HEMOGLOBIN (HGB A1C): Hemoglobin A1C: 8.1 % — AB (ref 4.0–5.6)

## 2020-12-26 MED ORDER — INSULIN PEN NEEDLE 31G X 8 MM MISC: 1.0000 | Freq: Every day | 1 refills | Status: DC

## 2020-12-26 MED ORDER — GABAPENTIN 300 MG PO CAPS: 600.0000 mg | ORAL_CAPSULE | Freq: Two times a day (BID) | ORAL | 1 refills | Status: DC

## 2020-12-26 MED ORDER — XULTOPHY 100-3.6 UNIT-MG/ML ~~LOC~~ SOPN
50.0000 [IU] | PEN_INJECTOR | Freq: Every day | SUBCUTANEOUS | 1 refills | Status: DC
Start: 2020-12-26 — End: 2021-04-24

## 2020-12-26 MED ORDER — LOSARTAN POTASSIUM-HCTZ 50-12.5 MG PO TABS: 1.0000 | ORAL_TABLET | Freq: Every day | ORAL | 1 refills | Status: DC

## 2020-12-26 MED ORDER — VITAMIN D (ERGOCALCIFEROL) 1.25 MG (50000 UNIT) PO CAPS: 50000.0000 [IU] | ORAL_CAPSULE | ORAL | 1 refills | Status: DC

## 2020-12-26 MED ORDER — TRELEGY ELLIPTA 100-62.5-25 MCG/INH IN AEPB
1.0000 | INHALATION_SPRAY | Freq: Every day | RESPIRATORY_TRACT | 1 refills | Status: DC
Start: 2020-12-26 — End: 2021-09-19

## 2020-12-26 MED ORDER — SYNJARDY XR 12.5-1000 MG PO TB24: 2.0000 | ORAL_TABLET | Freq: Every day | ORAL | 1 refills | Status: DC

## 2020-12-26 MED ORDER — ATORVASTATIN CALCIUM 40 MG PO TABS
40.0000 mg | ORAL_TABLET | Freq: Every day | ORAL | 1 refills | Status: DC
Start: 2020-12-26 — End: 2021-10-10

## 2020-12-26 NOTE — Progress Notes (Signed)
Name: Janice Short   MRN: [id number]    DOB: [birth year]   Date:12/26/2020       Progress Note  Subjective  Chief Complaint  Medication Refill  HPI  DMII: She is on 50 units of Xultophy and also Synajrdi  She states at home has been around 1630 fasting.   She denies polyphagia, polyuria or polydipsia. Last A1C has gone up to 8.1 %  She has dyslipidemia and neuropathy but stable. Taking gabapentin and statin therapy . She has lost a lot of weight. Discussed importance of diabetic diet since glucose not at goal   Malnutrition: she has lost 44 lbs in the past year, she is not trying, discussed colonoscopy but she prefers fecal test first. She smokes a few cigarettes daily , but used to be a heavy smoker on her early years, but she refuses having lung CT done - does not want to go to the hospital   She has some early satiety and discussed EGD but she would like to hold off for now.   MDD: she has a long history of depression, periods of crying spell, lack of motivation, took lexapro for a while, but weaned self off and states she is doing well now. phq 9 is negative   Asthma/copd syndrome: still smokes cigarettes intermittently, used to be a heavy smoker, but down to less than 0.25 pack daily now, cough and wheezing has improved, she denies SOB with activity . She has not been taking trelegy   HTN: bp is at goal, taking medication, no chest pain or palpitation.  Chronic back pain: she has spinal stenosis, never had surgery she has disability. She states pain is daily, described as aching , right now is 6/10 , very seldom has radiculitis down left side, but no saddle anesthesia.   Patient Active Problem List   Diagnosis Date Noted  . Mild episode of recurrent major depressive disorder ([place]) 02/08/2019  . Dyslipidemia associated with type 2 diabetes mellitus ([place]) 12/17/2017  . Noncompliance with medication regimen 12/17/2017  . Spinal stenosis 10/15/2016  . Diabetic polyneuropathy  associated with type 2 diabetes mellitus ([place]) 07/14/2016  . Chronic midline low back pain with sciatica 06/22/2016  . Primary osteoarthritis of both knees 06/22/2016  . Carpal tunnel syndrome 07/01/2015  . Cervical radiculitis 07/01/2015  . Corn or callus 07/01/2015  . Impingement syndrome of shoulder 07/01/2015  . Microalbuminuria 07/01/2015  . Compulsive tobacco user syndrome 07/01/2015  . Tenosynovitis of wrist 07/01/2015  . Benign hypertension 11/05/2009  . Uncontrolled type 2 diabetes mellitus with microalbuminuria ([place]) 11/05/2009  . Athlete's foot 08/05/2009  . Combined fat and carbohydrate induced hyperlipemia 01/08/2009  . Vitamin D deficiency 01/08/2009  . Asthma, mild intermittent 12/28/2008  . Allergic rhinitis 12/28/2008  . Lumbosacral neuritis 08/11/2007    Past Surgical History:  Procedure Laterality Date  . ABDOMINAL HYSTERECTOMY      Family History  Problem Relation Age of Onset  . Heart attack Mother   . CAD Mother   . Kidney disease Father     Social History   Tobacco Use  . Smoking status: Current Some Day Smoker    Packs/day: 0.25    Types: Cigarettes  . Smokeless tobacco: Never Used  Substance Use Topics  . Alcohol use: No    Alcohol/week: 0.0 standard drinks     Current Outpatient Medications:  .  acetaminophen (TYLENOL) 500 MG tablet, Take 1 tablet by mouth at bedtime as needed., Disp: , Rfl:  .  albuterol (VENTOLIN HFA) 108 (90 Base) MCG/ACT inhaler, INHALE 2 PUFFS BY MOUTH EVERY 6 HOURS AS NEEDED FOR WHEEZING AND FOR SHORTNESS OF BREATH, Disp: 18 g, Rfl: 1 .  aspirin 81 MG tablet, Take 1 tablet by mouth daily., Disp: , Rfl:  .  BAYER MICROLET LANCETS lancets, Use as instructed, Disp: 100 each, Rfl: 12 .  fluticasone (FLONASE) 50 MCG/ACT nasal spray, USE 2 SPRAY(S) IN EACH NOSTRIL AT BEDTIME, Disp: 48 g, Rfl: 0 .  glucose blood (BAYER CONTOUR TEST) test strip, Use as instructed, Disp: 100 each, Rfl: 2 .  Insulin Pen Needle 31G X 8 MM MISC,  1 each by Does not apply route daily., Disp: 100 each, Rfl: 1 .  atorvastatin (LIPITOR) 40 MG tablet, Take 1 tablet (40 mg total) by mouth at bedtime., Disp: 90 tablet, Rfl: 1 .  cetirizine (ZYRTEC) 10 MG tablet, Take 10 mg by mouth daily. (Patient not taking: Reported on 12/26/2020), Disp: , Rfl:  .  Empagliflozin-metFORMIN HCl ER (SYNJARDY XR) 12.04-999 MG TB24, Take 2 tablets by mouth daily. New dose, Disp: 180 tablet, Rfl: 1 .  Fluticasone-Umeclidin-Vilant (TRELEGY ELLIPTA) 100-62.5-25 MCG/INH AEPB, Take 1 puff by mouth daily., Disp: 180 each, Rfl: 1 .  gabapentin (NEURONTIN) 300 MG capsule, Take 2 capsules (600 mg total) by mouth 2 (two) times daily., Disp: 360 capsule, Rfl: 1 .  Insulin Degludec-Liraglutide (XULTOPHY) 100-3.6 UNIT-MG/ML SOPN, Inject 50 Units into the skin daily., Disp: 15 mL, Rfl: 1 .  losartan-hydrochlorothiazide (HYZAAR) 50-12.5 MG tablet, Take 1 tablet by mouth daily., Disp: 90 tablet, Rfl: 1 .  Vitamin D, Ergocalciferol, (DRISDOL) 1.25 MG (50000 UNIT) CAPS capsule, Take 1 capsule (50,000 Units total) by mouth every 7 (seven) days., Disp: 12 capsule, Rfl: 1  Allergies  Allergen Reactions  . Other Shortness Of Breath    Cat dander and grass pollen  . Bydureon [Exenatide] Other (See Comments)    Pain with injection not allergy    I personally reviewed active problem list, medication list, allergies, family history, social history, health maintenance with the patient/caregiver today.   ROS  Constitutional: Negative for fever, positive for  weight change.  Respiratory: Negative for cough and shortness of breath.   Cardiovascular: Negative for chest pain or palpitations.  Gastrointestinal: Negative for abdominal pain, no bowel changes.  Musculoskeletal: Negative for gait problem or joint swelling.  Skin: Negative for rash.  Neurological: Negative for dizziness or headache.  No other specific complaints in a complete review of systems (except as listed in HPI  above).  Objective  Vitals:   12/26/20 1544  BP: 138/70  Pulse: 99  Resp: 16  Temp: 98 F (36.7 C)  TempSrc: Oral  SpO2: 100%  Weight: 166 lb (75.3 kg)  Height: 5\' 6"  (1.676 m)    Body mass index is 26.79 kg/m.  Physical Exam  Constitutional: Patient appears well-developed and temporal waisting, clothes big on her.  No distress.  HEENT: head atraumatic, normocephalic, pupils equal and reactive to light,  neck supple Cardiovascular: Normal rate, regular rhythm and normal heart sounds.  No murmur heard. No BLE edema. Pulmonary/Chest: Effort normal and breath sounds normal. No respiratory distress. Abdominal: Soft.  There is no tenderness. Psychiatric: Patient has a normal mood and affect. behavior is normal. Judgment and thought content normal.  Recent Results (from the past 2160 hour(s))  POCT HgB A1C     Status: Abnormal   Collection Time: 12/26/20  3:56 PM  Result Value Ref Range   Hemoglobin  A1C 8.1 (A) 4.0 - 5.6 %   HbA1c POC (<> result, manual entry)     HbA1c, POC (prediabetic range)     HbA1c, POC (controlled diabetic range)      Diabetic Foot Exam: Diabetic Foot Exam - Simple   Simple Foot Form Diabetic Foot exam was performed with the following findings: Yes 12/26/2020  3:12 PM  Visual Inspection See comments: Yes Sensation Testing Intact to touch and monofilament testing bilaterally: Yes Pulse Check Posterior Tibialis and Dorsalis pulse intact bilaterally: Yes Comments Corn and callus formation , thick toenails and dry skin , refused referral to podiatrist      PHQ2/9: Depression screen [facility] 2/9 12/26/2020 02/15/2020 01/30/2020 09/01/2019 06/01/2019  Decreased Interest 0 1 0 0 0  Down, Depressed, Hopeless 0 1 0 0 1  PHQ - 2 Score 0 2 0 0 1  Altered sleeping 0 0 - 0 0  Tired, decreased energy 0 0 - 0 0  Change in appetite 0 3 - 0 0  Feeling bad or failure about yourself  0 0 - 0 0  Trouble concentrating 0 0 - 0 0  Moving slowly or fidgety/restless 0 0  - 0 0  Suicidal thoughts 0 0 - 0 0  PHQ-9 Score 0 5 - 0 1  Difficult doing work/chores - - - Not difficult at all -    phq 9 is negative  Fall Risk: Fall Risk  12/26/2020 02/15/2020 01/30/2020 09/01/2019 06/01/2019  Falls in the past year? 0 0 0 0 0  Number falls in past yr: 0 0 0 0 0  Injury with Fall? 0 0 0 0 0  Comment - - - - -  Risk for fall due to : - - No Fall Risks - -  Follow up - Falls evaluation completed Falls prevention discussed - -   Functional Status Survey: Is the patient deaf or have difficulty hearing?: No Does the patient have difficulty seeing, even when wearing glasses/contacts?: No Does the patient have difficulty concentrating, remembering, or making decisions?: No Does the patient have difficulty walking or climbing stairs?: No Does the patient have difficulty dressing or bathing?: No Does the patient have difficulty doing errands alone such as visiting a doctor's office or shopping?: No    Assessment & Plan  1. Uncontrolled type 2 diabetes mellitus with microalbuminuria (HCC)  - POCT HgB A1C - HM Diabetes Foot Exam - AMB Referral to [facility] - Insulin Degludec-Liraglutide (XULTOPHY) 100-3.6 UNIT-MG/ML SOPN; Inject 50 Units into the skin daily.  Dispense: 15 mL; Refill: 1 - Empagliflozin-metFORMIN HCl ER (SYNJARDY XR) 12.04-999 MG TB24; Take 2 tablets by mouth daily. New dose  Dispense: 180 tablet; Refill: 1 - Insulin Pen Needle 31G X 8 MM MISC; 1 each by Does not apply route daily.  Dispense: 100 each; Refill: 1  2. Colon cancer screening  - Fecal Globin By Immunochemistry  3. Dyslipidemia associated with type 2 diabetes mellitus (HCC)  - Lipid panel - atorvastatin (LIPITOR) 40 MG tablet; Take 1 tablet (40 mg total) by mouth at bedtime.  Dispense: 90 tablet; Refill: 1 - Insulin Degludec-Liraglutide (XULTOPHY) 100-3.6 UNIT-MG/ML SOPN; Inject 50 Units into the skin daily.  Dispense: 15 mL; Refill: 1 - Empagliflozin-metFORMIN HCl ER  (SYNJARDY XR) 12.04-999 MG TB24; Take 2 tablets by mouth daily. New dose  Dispense: 180 tablet; Refill: 1  4. Vitamin D deficiency  - Vitamin D, Ergocalciferol, (DRISDOL) 1.25 MG (50000 UNIT) CAPS capsule; Take 1 capsule (50,000 Units  total) by mouth every 7 (seven) days.  Dispense: 12 capsule; Refill: 1  5. Asthma-COPD overlap syndrome ([place])   - Fluticasone-Umeclidin-Vilant (TRELEGY ELLIPTA) 100-62.5-25 MCG/INH AEPB; Take 1 puff by mouth daily.  Dispense: 180 each; Refill: 1  6. Perennial allergic rhinitis with seasonal variation   7. Benign hypertension  - losartan-hydrochlorothiazide (HYZAAR) 50-12.5 MG tablet; Take 1 tablet by mouth daily.  Dispense: 90 tablet; Refill: 1  8. Hypertension associated with type 2 diabetes mellitus (HCC)  - HM Diabetes Foot Exam - Microalbumin / creatinine urine ratio - COMPLETE METABOLIC PANEL WITH GFR  9. Major depression in remission ([place])   10. Diabetic polyneuropathy associated with type 2 diabetes mellitus (HCC)  - Insulin Degludec-Liraglutide (XULTOPHY) 100-3.6 UNIT-MG/ML SOPN; Inject 50 Units into the skin daily.  Dispense: 15 mL; Refill: 1 - gabapentin (NEURONTIN) 300 MG capsule; Take 2 capsules (600 mg total) by mouth 2 (two) times daily.  Dispense: 360 capsule; Refill: 1  11. Chronic midline low back pain with sciatica, sciatica laterality unspecified  - gabapentin (NEURONTIN) 300 MG capsule; Take 2 capsules (600 mg total) by mouth 2 (two) times daily.  Dispense: 360 capsule; Refill: 1  12. Spinal stenosis of lumbosacral region  - gabapentin (NEURONTIN) 300 MG capsule; Take 2 capsules (600 mg total) by mouth 2 (two) times daily.  Dispense: 360 capsule; Refill: 1  13. Moderate protein-calorie malnutrition (HCC)  - TSH - Sedimentation rate - C-reactive protein  14. Screening for lung cancer  refused  15. Need for immunization against influenza  - Flu Vaccine QUAD 36+ mos IM

## 2020-12-27 ENCOUNTER — Ambulatory Visit: Payer: Medicare HMO | Admitting: Family Medicine

## 2020-12-27 ENCOUNTER — Telehealth: Payer: Self-pay | Admitting: *Deleted

## 2020-12-27 NOTE — Chronic Care Management (AMB) (Signed)
  Chronic Care Management   Outreach Note  12/27/2020 Name: Janice Short MRN: [id number] DOB: 10/22/1961  Janice Short is a 60 y.o. year old female who is a primary care patient of Steele Sizer, MD. I reached out to [name] by phone today in response to a referral sent by [name]'s PCP, Steele Sizer, MD.      An unsuccessful telephone outreach was attempted today. The patient was referred to the case management team for assistance with care management and care coordination.   Follow Up Plan: A HIPAA compliant phone message was left for the patient providing contact information and requesting a return call. The care management team will reach out to the patient again over the next 7 days. If patient returns call to provider office, please advise to call [place] at [phone].  [place]  Direct Dial: [phone]

## 2020-12-30 ENCOUNTER — Other Ambulatory Visit: Payer: Self-pay | Admitting: Family Medicine

## 2020-12-30 DIAGNOSIS — R7982 Elevated C-reactive protein (CRP): Secondary | ICD-10-CM

## 2020-12-30 DIAGNOSIS — R799 Abnormal finding of blood chemistry, unspecified: Secondary | ICD-10-CM

## 2021-01-01 NOTE — Chronic Care Management (AMB) (Addendum)
  Chronic Care Management   Note  01/01/2021 Name: Janice Short MRN: [id number] DOB: [birth year]  Janice Short is a 60 y.o. year old female who is a primary care patient of Steele Sizer, MD. I reached out to [name] by phone today in response to a referral sent by Ms. Percival Spanish Kho's PCP, Steele Sizer, MD.  Ms. Wisham was given information about Chronic Care Management services today including:  1. CCM service includes personalized support from designated clinical staff supervised by her physician, including individualized plan of care and coordination with other care providers 2. 24/7 contact phone numbers for assistance for urgent and routine care needs. 3. Service will only be billed when office clinical staff spend 20 minutes or more in a month to coordinate care. 4. Only one practitioner may furnish and bill the service in a calendar month. 5. The patient may stop CCM services at any time (effective at the end of the month) by phone call to the office staff. 6. The patient will be responsible for cost sharing (co-pay) of up to 20% of the service fee (after annual deductible is met).  Patient agreed to services and verbal consent obtained.   Follow up plan: Telephone appointment with care management team member scheduled for:01/08/2021  Tieshia Rettinger  Care Guide, Embedded Care Coordination [place]  Care Management

## 2021-01-01 NOTE — Chronic Care Management (AMB) (Signed)
Change outreach date

## 2021-01-02 LAB — COMPLETE METABOLIC PANEL WITH GFR
AG Ratio: 0.9 (calc) — ABNORMAL LOW (ref 1.0–2.5)
ALT: 41 U/L — ABNORMAL HIGH (ref 6–29)
AST: 52 U/L — ABNORMAL HIGH (ref 10–35)
Albumin: 3.9 g/dL (ref 3.6–5.1)
Alkaline phosphatase (APISO): 852 U/L — ABNORMAL HIGH (ref 37–153)
BUN/Creatinine Ratio: 18 (calc) (ref 6–22)
BUN: 19 mg/dL (ref 7–25)
CO2: 27 mmol/L (ref 20–32)
Calcium: 9.4 mg/dL (ref 8.6–10.4)
Chloride: 108 mmol/L (ref 98–110)
Creat: 1.08 mg/dL — ABNORMAL HIGH (ref 0.50–0.99)
GFR, Est African American: 64 mL/min/{1.73_m2} (ref >=60)
GFR, Est Non African American: 55 mL/min/{1.73_m2} — ABNORMAL LOW (ref >=60)
Globulin: 4.2 g/dL (calc) — ABNORMAL HIGH (ref 1.9–3.7)
Glucose, Bld: 86 mg/dL (ref 65–99)
Potassium: 4.8 mmol/L (ref 3.5–5.3)
Sodium: 141 mmol/L (ref 135–146)
Total Bilirubin: 0.7 mg/dL (ref 0.2–1.2)
Total Protein: 8.1 g/dL (ref 6.1–8.1)

## 2021-01-02 LAB — PROTEIN ELECTROPHORESIS, SERUM
Albumin ELP: 3.8 g/dL (ref 3.8–4.8)
Alpha 1: 0.5 g/dL — ABNORMAL HIGH (ref 0.2–0.3)
Alpha 2: 1.2 g/dL — ABNORMAL HIGH (ref 0.5–0.9)
Beta 2: 0.4 g/dL (ref 0.2–0.5)
Beta Globulin: 0.6 g/dL (ref 0.4–0.6)
Gamma Globulin: 1.8 g/dL — ABNORMAL HIGH (ref 0.8–1.7)
Total Protein: 8.3 g/dL — ABNORMAL HIGH (ref 6.1–8.1)

## 2021-01-02 LAB — TEST AUTHORIZATION

## 2021-01-02 LAB — LIPID PANEL
Cholesterol: 197 mg/dL (ref <200)
HDL: 36 mg/dL — ABNORMAL LOW (ref >=50)
LDL Cholesterol (Calc): 129 mg/dL (calc) — ABNORMAL HIGH
Non-HDL Cholesterol (Calc): 161 mg/dL (calc) — ABNORMAL HIGH (ref <130)
Total CHOL/HDL Ratio: 5.5 (calc) — ABNORMAL HIGH (ref <5.0)
Triglycerides: 181 mg/dL — ABNORMAL HIGH (ref <150)

## 2021-01-02 LAB — MICROALBUMIN / CREATININE URINE RATIO
Creatinine, Urine: 102 mg/dL (ref 20–275)
Microalb Creat Ratio: 10 mcg/mg creat (ref <30)
Microalb, Ur: 1 mg/dL

## 2021-01-02 LAB — SEDIMENTATION RATE: Sed Rate: 111 mm/h — ABNORMAL HIGH (ref 0–30)

## 2021-01-02 LAB — C-REACTIVE PROTEIN: CRP: 39.3 mg/L — ABNORMAL HIGH (ref <8.0)

## 2021-01-02 LAB — TSH: TSH: 3.09 mIU/L (ref 0.40–4.50)

## 2021-01-03 ENCOUNTER — Other Ambulatory Visit: Payer: Self-pay

## 2021-01-03 DIAGNOSIS — Z1211 Encounter for screening for malignant neoplasm of colon: Secondary | ICD-10-CM

## 2021-01-03 NOTE — Progress Notes (Signed)
Referral for Colonoscopy

## 2021-01-07 DIAGNOSIS — Z1211 Encounter for screening for malignant neoplasm of colon: Secondary | ICD-10-CM | POA: Diagnosis not present

## 2021-01-07 DIAGNOSIS — E1159 Type 2 diabetes mellitus with other circulatory complications: Secondary | ICD-10-CM | POA: Diagnosis not present

## 2021-01-07 DIAGNOSIS — E440 Moderate protein-calorie malnutrition: Secondary | ICD-10-CM | POA: Diagnosis not present

## 2021-01-07 DIAGNOSIS — I152 Hypertension secondary to endocrine disorders: Secondary | ICD-10-CM | POA: Diagnosis not present

## 2021-01-07 DIAGNOSIS — E1169 Type 2 diabetes mellitus with other specified complication: Secondary | ICD-10-CM | POA: Diagnosis not present

## 2021-01-07 DIAGNOSIS — E785 Hyperlipidemia, unspecified: Secondary | ICD-10-CM | POA: Diagnosis not present

## 2021-01-08 ENCOUNTER — Telehealth: Payer: Medicare HMO

## 2021-01-14 LAB — FECAL GLOBIN BY IMMUNOCHEMISTRY
FECAL GLOBIN RESULT:: NOT DETECTED
MICRO NUMBER:: 11496701
SPECIMEN QUALITY:: ADEQUATE

## 2021-01-30 ENCOUNTER — Ambulatory Visit (INDEPENDENT_AMBULATORY_CARE_PROVIDER_SITE_OTHER): Payer: Medicare HMO

## 2021-01-30 DIAGNOSIS — Z1231 Encounter for screening mammogram for malignant neoplasm of breast: Secondary | ICD-10-CM | POA: Diagnosis not present

## 2021-01-30 DIAGNOSIS — Z0000 Encounter for general adult medical examination without abnormal findings: Secondary | ICD-10-CM | POA: Diagnosis not present

## 2021-01-30 DIAGNOSIS — E1129 Type 2 diabetes mellitus with other diabetic kidney complication: Secondary | ICD-10-CM | POA: Diagnosis not present

## 2021-01-30 DIAGNOSIS — I152 Hypertension secondary to endocrine disorders: Secondary | ICD-10-CM

## 2021-01-30 DIAGNOSIS — E1159 Type 2 diabetes mellitus with other circulatory complications: Secondary | ICD-10-CM

## 2021-01-30 DIAGNOSIS — IMO0002 Reserved for concepts with insufficient information to code with codable children: Secondary | ICD-10-CM

## 2021-01-30 DIAGNOSIS — Z599 Problem related to housing and economic circumstances, unspecified: Secondary | ICD-10-CM

## 2021-01-30 DIAGNOSIS — E1165 Type 2 diabetes mellitus with hyperglycemia: Secondary | ICD-10-CM | POA: Diagnosis not present

## 2021-01-30 DIAGNOSIS — R809 Proteinuria, unspecified: Secondary | ICD-10-CM

## 2021-01-30 NOTE — Progress Notes (Signed)
Subjective:   Janice Short is a 60 y.o. female who presents for Medicare Annual (Subsequent) preventive examination.  Virtual Visit via Telephone Note  I connected with  [name] on 01/30/21 at 10:40 AM EST by telephone and verified that I am speaking with the correct person using two identifiers.  Location: Patient: home Provider: [place] Persons participating in the virtual visit: [place]   I discussed the limitations, risks, security and privacy concerns of performing an evaluation and management service by telephone and the availability of in person appointments. The patient expressed understanding and agreed to proceed.  Interactive audio and video telecommunications were attempted between this nurse and patient, however failed, due to patient having technical difficulties OR patient did not have access to video capability.  We continued and completed visit with audio only.  Some vital signs may be absent or patient reported.   Clemetine Marker, LPN   Review of Systems           Objective:    There were no vitals filed for this visit. There is no height or weight on file to calculate BMI.  Advanced Directives 01/30/2021 01/30/2020 01/28/2017 12/03/2016 10/15/2016 07/14/2016 01/09/2016  Does Patient Have a Medical Advance Directive? No No No No No No No  Would patient like information on creating a medical advance directive? Yes (MAU/Ambulatory/Procedural Areas - Information given) Yes (MAU/Ambulatory/Procedural Areas - Information given) - No - Patient declined No - patient declined information No - patient declined information -    Current Medications (verified) Outpatient Encounter Medications as of 01/30/2021  Medication Sig  . acetaminophen (TYLENOL) 500 MG tablet Take 1 tablet by mouth at bedtime as needed.  Marland Kitchen albuterol (VENTOLIN HFA) 108 (90 Base) MCG/ACT inhaler INHALE 2 PUFFS BY MOUTH EVERY 6 HOURS AS NEEDED FOR WHEEZING AND FOR SHORTNESS OF BREATH  .  aspirin 81 MG tablet Take 1 tablet by mouth daily.  Marland Kitchen atorvastatin (LIPITOR) 40 MG tablet Take 1 tablet (40 mg total) by mouth at bedtime.  Marland Kitchen BAYER MICROLET LANCETS lancets Use as instructed  . Empagliflozin-metFORMIN HCl ER (SYNJARDY XR) 12.04-999 MG TB24 Take 2 tablets by mouth daily. New dose  . fluticasone (FLONASE) 50 MCG/ACT nasal spray USE 2 SPRAY(S) IN EACH NOSTRIL AT BEDTIME  . Fluticasone-Umeclidin-Vilant (TRELEGY ELLIPTA) 100-62.5-25 MCG/INH AEPB Take 1 puff by mouth daily.  Marland Kitchen gabapentin (NEURONTIN) 300 MG capsule Take 2 capsules (600 mg total) by mouth 2 (two) times daily.  Marland Kitchen glucose blood (BAYER CONTOUR TEST) test strip Use as instructed  . Insulin Degludec-Liraglutide (XULTOPHY) 100-3.6 UNIT-MG/ML SOPN Inject 50 Units into the skin daily.  . Insulin Pen Needle 31G X 8 MM MISC 1 each by Does not apply route daily.  Marland Kitchen losartan-hydrochlorothiazide (HYZAAR) 50-12.5 MG tablet Take 1 tablet by mouth daily.  . Vitamin D, Ergocalciferol, (DRISDOL) 1.25 MG (50000 UNIT) CAPS capsule Take 1 capsule (50,000 Units total) by mouth every 7 (seven) days.  . [DISCONTINUED] cetirizine (ZYRTEC) 10 MG tablet Take 10 mg by mouth daily. (Patient not taking: Reported on 12/26/2020)   No facility-administered encounter medications on file as of 01/30/2021.    Allergies (verified) Other and Bydureon [exenatide]   History: Past Medical History:  Diagnosis Date  . Allergy   . Asthma   . Carpal tunnel syndrome on left   . Cervical radiculitis   . Chronic osteoarthritis   . Corns and callosity   . Depression   . Dermatophytosis of foot   .  Diabetes mellitus without complication ([place])   . Gout   . Hyperlipidemia   . Hypertension   . Impingement syndrome of left shoulder   . Lumbosacral neuritis   . Microalbuminuria   . Obesity   . Supraventricular tachycardia ([place]) 07/01/2015  . SVT (supraventricular tachycardia) ([place])   . Tenosynovitis of wrist   . Vitamin D deficiency    Past Surgical  History:  Procedure Laterality Date  . ABDOMINAL HYSTERECTOMY     Family History  Problem Relation Age of Onset  . Heart attack Mother   . CAD Mother   . Kidney disease Father    Social History   Socioeconomic History  . Marital status: Single    Spouse name: Not on file  . Number of children: 2  . Years of education: Not on file  . Highest education level: Not on file  Occupational History  . Occupation: disable     Comment: from chronic back pain, 2019  Tobacco Use  . Smoking status: Current Some Day Smoker    Packs/day: 0.25    Types: Cigarettes  . Smokeless tobacco: Never Used  . Tobacco comment: 2 per day 01/30/21  Vaping Use  . Vaping Use: Never used  Substance and Sexual Activity  . Alcohol use: No    Alcohol/week: 0.0 standard drinks  . Drug use: No  . Sexual activity: Yes  Other Topics Concern  . Not on file  Social History Narrative   She is on disability for chronic back pain. Pt's son lives with her   Social Determinants of Health   Financial Resource Strain: Medium Risk  . Difficulty of Paying Living Expenses: Somewhat hard  Food Insecurity: No Food Insecurity  . Worried About Charity fundraiser in the Last Year: Never true  . Ran Out of Food in the Last Year: Never true  Transportation Needs: No Transportation Needs  . Lack of Transportation (Medical): No  . Lack of Transportation (Non-Medical): No  Physical Activity: Inactive  . Days of Exercise per Week: 0 days  . Minutes of Exercise per Session: 0 min  Stress: No Stress Concern Present  . Feeling of Stress : Not at all  Social Connections: Moderately Isolated  . Frequency of Communication with Friends and Family: More than three times a week  . Frequency of Social Gatherings with Friends and Family: Three times a week  . Attends Religious Services: More than 4 times per year  . Active Member of Clubs or Organizations: No  . Attends Archivist Meetings: Never  . Marital Status:  Never married    Tobacco Counseling Ready to quit: Not Answered Counseling given: Not Answered Comment: 2 per day 01/30/21   Clinical Intake:  Pre-visit preparation completed: Yes  Pain : No/denies pain     Nutritional Risks: None Diabetes: Yes CBG done?: No Did pt. bring in CBG monitor from home?: No  How often do you need to have someone help you when you read instructions, pamphlets, or other written materials from your doctor or pharmacy?: 1 - Never  Nutrition Risk Assessment:  Has the patient had any N/V/D within the last 2 months?  No  Does the patient have any non-healing wounds?  No  Has the patient had any unintentional weight loss or weight gain?  No   Diabetes:  Is the patient diabetic?  Yes  If diabetic, was a CBG obtained today?  No  Did the patient bring in their glucometer from home?  No  How often do you monitor your CBG's? Sometimes per patient.   Financial Strains and Diabetes Management:  Are you having any financial strains with the device, your supplies or your medication? No .  Does the patient want to be seen by Chronic Care Management for management of their diabetes?  Yes - already enrolled Would the patient like to be referred to a Nutritionist or for Diabetic Management?  No   Diabetic Exams:  Diabetic Eye Exam: Overdue for diabetic eye exam. Pt has been advised about the importance in completing this exam.   Diabetic Foot Exam: Completed 12/26/20.   Interpreter Needed?: No  Information entered by :: Clemetine Marker LPN   Activities of Daily Living In your present state of health, do you have any difficulty performing the following activities: 12/26/2020 02/15/2020  Hearing? N N  Vision? N N  Difficulty concentrating or making decisions? N N  Walking or climbing stairs? N N  Dressing or bathing? N N  Doing errands, shopping? N N  Some recent data might be hidden    Patient Care Team: Steele Sizer, MD as PCP - General (Family  Medicine) Neldon Labella, RN as Registered Nurse  Indicate any recent Medical Services you may have received from other than [facility] providers in the past year (date may be approximate).     Assessment:   This is a routine wellness examination for [place].  Hearing/Vision screen  Hearing Screening   125Hz  250Hz  500Hz  1000Hz  2000Hz  3000Hz  4000Hz  6000Hz  8000Hz   Right ear:           Left ear:           Comments: Pt denies hearing difficulty  Vision Screening Comments: Pt past due for eye exam. Pt established with [facility].  Dietary issues and exercise activities discussed:    Goals    . Quit Smoking     If you wish to quit smoking, help is available. For free tobacco cessation program offerings call the [facility] at [phone] or Live Well Line at (660)883-7509. You may also visit [email or web address] or email [email or web address] for more information on other programs.        Depression Screen PHQ 2/9 Scores 01/30/2021 12/26/2020 02/15/2020 01/30/2020 09/01/2019 06/01/2019 03/13/2019  PHQ - 2 Score 0 0 2 0 0 1 2  PHQ- 9 Score - 0 5 - 0 1 5    Fall Risk Fall Risk  01/30/2021 12/26/2020 02/15/2020 01/30/2020 09/01/2019  Falls in the past year? 0 0 0 0 0  Number falls in past yr: 0 0 0 0 0  Injury with Fall? 0 0 0 0 0  Comment - - - - -  Risk for fall due to : No Fall Risks - - No Fall Risks -  Follow up Falls prevention discussed - Falls evaluation completed Falls prevention discussed -    FALL RISK PREVENTION PERTAINING TO THE HOME:  Any stairs in or around the home? Yes  If so, are there any without handrails? No  Home free of loose throw rugs in walkways, pet beds, electrical cords, etc? Yes  Adequate lighting in your home to reduce risk of falls? Yes   ASSISTIVE DEVICES UTILIZED TO PREVENT FALLS:  Life alert? No  Use of a cane, walker or w/c? No  Grab bars in the bathroom? No  Shower chair or bench in shower? No  Elevated toilet seat or a  handicapped toilet? No   TIMED UP  AND GO:  Was the test performed? No . Telephonic visit.   Cognitive Function: Normal cognitive status assessed by direct observation by this Nurse Health Advisor. No abnormalities found.       6CIT Screen 01/30/2020  What Year? 0 points  What month? 0 points  What time? 0 points  Count back from 20 0 points  Months in reverse 2 points  Repeat phrase 0 points  Total Score 2    Immunizations Immunization History  Administered Date(s) Administered  . Influenza,inj,Quad PF,6+ Mos 08/14/2013, 08/02/2014, 11/19/2015, 10/15/2016, 12/26/2020  . Influenza-Unspecified 08/02/2014  . PPD Test 07/01/2015, 09/09/2016  . Pneumococcal Conjugate-13 04/20/2018  . Pneumococcal Polysaccharide-23 02/08/2019  . Td 05/08/2010  . Tdap 05/08/2010    TDAP status: Due, Education has been provided regarding the importance of this vaccine. Advised may receive this vaccine at local pharmacy or Health Dept. Aware to provide a copy of the vaccination record if obtained from local pharmacy or Health Dept. Verbalized acceptance and understanding.  Flu Vaccine status: Up to date  Pneumococcal vaccine status: Up to date  Covid-19 vaccine status: Declined, Education has been provided regarding the importance of this vaccine but patient still declined. Advised may receive this vaccine at local pharmacy or Health Dept.or vaccine clinic. Aware to provide a copy of the vaccination record if obtained from local pharmacy or Health Dept. Verbalized acceptance and understanding.  Qualifies for Shingles Vaccine? Yes   Zostavax completed No   Shingrix Completed?: No.    Education has been provided regarding the importance of this vaccine. Patient has been advised to call insurance company to determine out of pocket expense if they have not yet received this vaccine. Advised may also receive vaccine at local pharmacy or Health Dept. Verbalized acceptance and understanding.  Screening  Tests Health Maintenance  Topic Date Due  . OPHTHALMOLOGY EXAM  Never done  . MAMMOGRAM  04/23/2016  . COVID-19 Vaccine (1) 02/15/2021 (Originally 02/24/1966)  . TETANUS/TDAP  12/26/2021 (Originally 05/08/2020)  . COLONOSCOPY (Pts 45-59yrs Insurance coverage will need to be confirmed)  01/14/2022 (Originally 02/24/2004)  . HEMOGLOBIN A1C  06/25/2021  . FOOT EXAM  12/26/2021  . COLON CANCER SCREENING ANNUAL FOBT  01/14/2022  . INFLUENZA VACCINE  Completed  . PNEUMOCOCCAL POLYSACCHARIDE VACCINE AGE 68-64 HIGH RISK  Completed  . Hepatitis C Screening  Completed  . HIV Screening  Completed    Health Maintenance  Health Maintenance Due  Topic Date Due  . OPHTHALMOLOGY EXAM  Never done  . MAMMOGRAM  04/23/2016    Colorectal cancer screening: Referral to GI placed 01/03/21. Pt aware the office will call re: appt.. Referral notes state patient declines colonoscopy. Discussed with patient and she continues to decline.    Mammogram status: Completed 03/06/15. Repeat every year. Ordered today.   Bone density status: due age 37  Lung Cancer Screening: (Low Dose CT Chest recommended if Age 54-80 years, 30 pack-year currently smoking OR have quit w/in 15years.) does not qualify.   Additional Screening:  Hepatitis C Screening: does qualify; Completed 06/02/13  Vision Screening: Recommended annual ophthalmology exams for early detection of glaucoma and other disorders of the eye. Is the patient up to date with their annual eye exam?  No  Who is the provider or what is the name of the office in which the patient attends annual eye exams? [place] Screening: Recommended annual dental exams for proper oral hygiene  Community Resource Referral / Chronic Care Management: CRR required  this visit?  Yes - utility resources  CCM required this visit?  No   - already enrolled    Plan:     I have personally reviewed and noted the following in the patient's chart:   . Medical and  social history . Use of alcohol, tobacco or illicit drugs  . Current medications and supplements . Functional ability and status . Nutritional status . Physical activity . Advanced directives . List of other physicians . Hospitalizations, surgeries, and ER visits in previous 12 months . Vitals . Screenings to include cognitive, depression, and falls . Referrals and appointments  In addition, I have reviewed and discussed with patient certain preventive protocols, quality metrics, and best practice recommendations. A written personalized care plan for preventive services as well as general preventive health recommendations were provided to patient.     Clemetine Marker, LPN   [id number]   Nurse Notes: pt advised CCM scheduled to follow up today for DM and COPD management

## 2021-01-30 NOTE — Patient Instructions (Signed)
Janice Short , Thank you for taking time to come for your Medicare Wellness Visit. I appreciate your ongoing commitment to your health goals. Please review the following plan we discussed and let me know if I can assist you in the future.   Screening recommendations/referrals: Colonoscopy: due Mammogram: done 03/06/15. Please call [phone] to schedule your mammogram.  Bone Density: due age 60 Recommended yearly ophthalmology/optometry visit for glaucoma screening and checkup Recommended yearly dental visit for hygiene and checkup  Vaccinations: Influenza vaccine: done 12/26/20 Pneumococcal vaccine: done 02/08/19 Tdap vaccine: due Shingles vaccine: Shingrix discussed. Please contact your pharmacy for coverage information.  Covid-19:  declined  Advanced directives: Advance directive discussed with you today. I have provided a copy for you to complete at home and have notarized. Once this is complete please bring a copy in to our office so we can scan it into your chart.  Conditions/risks identified: Recommend increasing physical activity   Next appointment: Follow up in one year for your annual wellness visit.   Preventive Care 40-64 Years, Female Preventive care refers to lifestyle choices and visits with your health care provider that can promote health and wellness. What does preventive care include?  A yearly physical exam. This is also called an annual well check.  Dental exams once or twice a year.  Routine eye exams. Ask your health care provider how often you should have your eyes checked.  Personal lifestyle choices, including:  Daily care of your teeth and gums.  Regular physical activity.  Eating a healthy diet.  Avoiding tobacco and drug use.  Limiting alcohol use.  Practicing safe sex.  Taking low-dose aspirin daily starting at age 85.  Taking vitamin and mineral supplements as recommended by your health care provider. What happens during an annual well  check? The services and screenings done by your health care provider during your annual well check will depend on your age, overall health, lifestyle risk factors, and family history of disease. Counseling  Your health care provider may ask you questions about your:  Alcohol use.  Tobacco use.  Drug use.  Emotional well-being.  Home and relationship well-being.  Sexual activity.  Eating habits.  Work and work Astronomer.  Method of birth control.  Menstrual cycle.  Pregnancy history. Screening  You may have the following tests or measurements:  Height, weight, and BMI.  Blood pressure.  Lipid and cholesterol levels. These may be checked every 5 years, or more frequently if you are over 59 years old.  Skin check.  Lung cancer screening. You may have this screening every year starting at age 87 if you have a 30-pack-year history of smoking and currently smoke or have quit within the past 15 years.  Fecal occult blood test (FOBT) of the stool. You may have this test every year starting at age 55.  Flexible sigmoidoscopy or colonoscopy. You may have a sigmoidoscopy every 5 years or a colonoscopy every 10 years starting at age 22.  Hepatitis C blood test.  Hepatitis B blood test.  Sexually transmitted disease (STD) testing.  Diabetes screening. This is done by checking your blood sugar (glucose) after you have not eaten for a while (fasting). You may have this done every 1-3 years.  Mammogram. This may be done every 1-2 years. Talk to your health care provider about when you should start having regular mammograms. This may depend on whether you have a family history of breast cancer.  BRCA-related cancer screening. This may be done if  you have a family history of breast, ovarian, tubal, or peritoneal cancers.  Pelvic exam and Pap test. This may be done every 3 years starting at age 21. Starting at age 62, this may be done every 5 years if you have a Pap test in  combination with an HPV test.  Bone density scan. This is done to screen for osteoporosis. You may have this scan if you are at high risk for osteoporosis. Discuss your test results, treatment options, and if necessary, the need for more tests with your health care provider. Vaccines  Your health care provider may recommend certain vaccines, such as:  Influenza vaccine. This is recommended every year.  Tetanus, diphtheria, and acellular pertussis (Tdap, Td) vaccine. You may need a Td booster every 10 years.  Zoster vaccine. You may need this after age 9.  Pneumococcal 13-valent conjugate (PCV13) vaccine. You may need this if you have certain conditions and were not previously vaccinated.  Pneumococcal polysaccharide (PPSV23) vaccine. You may need one or two doses if you smoke cigarettes or if you have certain conditions. Talk to your health care provider about which screenings and vaccines you need and how often you need them. This information is not intended to replace advice given to you by your health care provider. Make sure you discuss any questions you have with your health care provider. Document Released: 12/20/2015 Document Revised: 08/12/2016 Document Reviewed: 09/24/2015 Elsevier Interactive Patient Education  2017 [place] Prevention in the Home Falls can cause injuries. They can happen to people of all ages. There are many things you can do to make your home safe and to help prevent falls. What can I do on the outside of my home?  Regularly fix the edges of walkways and driveways and fix any cracks.  Remove anything that might make you trip as you walk through a door, such as a raised step or threshold.  Trim any bushes or trees on the path to your home.  Use bright outdoor lighting.  Clear any walking paths of anything that might make someone trip, such as rocks or tools.  Regularly check to see if handrails are loose or broken. Make sure that both  sides of any steps have handrails.  Any raised decks and porches should have guardrails on the edges.  Have any leaves, snow, or ice cleared regularly.  Use sand or salt on walking paths during winter.  Clean up any spills in your garage right away. This includes oil or grease spills. What can I do in the bathroom?  Use night lights.  Install grab bars by the toilet and in the tub and shower. Do not use towel bars as grab bars.  Use non-skid mats or decals in the tub or shower.  If you need to sit down in the shower, use a plastic, non-slip stool.  Keep the floor dry. Clean up any water that spills on the floor as soon as it happens.  Remove soap buildup in the tub or shower regularly.  Attach bath mats securely with double-sided non-slip rug tape.  Do not have throw rugs and other things on the floor that can make you trip. What can I do in the bedroom?  Use night lights.  Make sure that you have a light by your bed that is easy to reach.  Do not use any sheets or blankets that are too big for your bed. They should not hang down onto the floor.  Have a firm chair that has side arms. You can use this for support while you get dressed.  Do not have throw rugs and other things on the floor that can make you trip. What can I do in the kitchen?  Clean up any spills right away.  Avoid walking on wet floors.  Keep items that you use a lot in easy-to-reach places.  If you need to reach something above you, use a strong step stool that has a grab bar.  Keep electrical cords out of the way.  Do not use floor polish or wax that makes floors slippery. If you must use wax, use non-skid floor wax.  Do not have throw rugs and other things on the floor that can make you trip. What can I do with my stairs?  Do not leave any items on the stairs.  Make sure that there are handrails on both sides of the stairs and use them. Fix handrails that are broken or loose. Make sure that  handrails are as long as the stairways.  Check any carpeting to make sure that it is firmly attached to the stairs. Fix any carpet that is loose or worn.  Avoid having throw rugs at the top or bottom of the stairs. If you do have throw rugs, attach them to the floor with carpet tape.  Make sure that you have a light switch at the top of the stairs and the bottom of the stairs. If you do not have them, ask someone to add them for you. What else can I do to help prevent falls?  Wear shoes that:  Do not have high heels.  Have rubber bottoms.  Are comfortable and fit you well.  Are closed at the toe. Do not wear sandals.  If you use a stepladder:  Make sure that it is fully opened. Do not climb a closed stepladder.  Make sure that both sides of the stepladder are locked into place.  Ask someone to hold it for you, if possible.  Clearly mark and make sure that you can see:  Any grab bars or handrails.  First and last steps.  Where the edge of each step is.  Use tools that help you move around (mobility aids) if they are needed. These include:  Canes.  Walkers.  Scooters.  Crutches.  Turn on the lights when you go into a dark area. Replace any light bulbs as soon as they burn out.  Set up your furniture so you have a clear path. Avoid moving your furniture around.  If any of your floors are uneven, fix them.  If there are any pets around you, be aware of where they are.  Review your medicines with your doctor. Some medicines can make you feel dizzy. This can increase your chance of falling. Ask your doctor what other things that you can do to help prevent falls. This information is not intended to replace advice given to you by your health care provider. Make sure you discuss any questions you have with your health care provider. Document Released: 09/19/2009 Document Revised: 04/30/2016 Document Reviewed: 12/28/2014 Elsevier Interactive Patient Education  2017  [employer].

## 2021-02-03 NOTE — Chronic Care Management (AMB) (Signed)
Chronic Care Management   Initial Visit Note   Name: Janice Short MRN: [id number] DOB: [birth year]  Primary Care Provider: Alba Cory, MD Reason for referral : Chronic Care Management   Janice Short is a 60 y.o. year old female who is a primary care patient of Alba Cory, MD. The CCM team was consulted for assistance with chronic disease management and care coordination needs.  Review of Janice Short status, including review of consultants reports, relevant labs and test results was conducted today. Collaboration with appropriate care team members was performed as part of the comprehensive evaluation and provision of chronic care management services.    SDOH (Social Determinants of Health) assessments performed: Yes See Care Plan activities for detailed interventions related to SDOH  SDOH Interventions   Flowsheet Row Most Recent Value  SDOH Interventions   Food Insecurity Interventions Intervention Not Indicated  Financial Strain Interventions Other (Comment)  [Referral submitted]  Transportation Interventions Intervention Not Indicated       Medications: Outpatient Encounter Medications as of 01/30/2021  Medication Sig  . acetaminophen (TYLENOL) 500 MG tablet Take 1 tablet by mouth at bedtime as needed.  Marland Kitchen albuterol (VENTOLIN HFA) 108 (90 Base) MCG/ACT inhaler INHALE 2 PUFFS BY MOUTH EVERY 6 HOURS AS NEEDED FOR WHEEZING AND FOR SHORTNESS OF BREATH  . aspirin 81 MG tablet Take 1 tablet by mouth daily.  Marland Kitchen atorvastatin (LIPITOR) 40 MG tablet Take 1 tablet (40 mg total) by mouth at bedtime.  Marland Kitchen BAYER MICROLET LANCETS lancets Use as instructed  . Empagliflozin-metFORMIN HCl ER (SYNJARDY XR) 12.04-999 MG TB24 Take 2 tablets by mouth daily. New dose  . fluticasone (FLONASE) 50 MCG/ACT nasal spray USE 2 SPRAY(S) IN EACH NOSTRIL AT BEDTIME  . Fluticasone-Umeclidin-Vilant (TRELEGY ELLIPTA) 100-62.5-25 MCG/INH AEPB Take 1 puff by mouth daily.  Marland Kitchen gabapentin (NEURONTIN) 300 MG capsule  Take 2 capsules (600 mg total) by mouth 2 (two) times daily.  Marland Kitchen glucose blood (BAYER CONTOUR TEST) test strip Use as instructed  . Insulin Degludec-Liraglutide (XULTOPHY) 100-3.6 UNIT-MG/ML SOPN Inject 50 Units into the skin daily.  . Insulin Pen Needle 31G X 8 MM MISC 1 each by Does not apply route daily.  Marland Kitchen losartan-hydrochlorothiazide (HYZAAR) 50-12.5 MG tablet Take 1 tablet by mouth daily.  . Vitamin D, Ergocalciferol, (DRISDOL) 1.25 MG (50000 UNIT) CAPS capsule Take 1 capsule (50,000 Units total) by mouth every 7 (seven) days.  . [DISCONTINUED] cetirizine (ZYRTEC) 10 MG tablet Take 10 mg by mouth daily. (Patient not taking: Reported on 12/26/2020)   No facility-administered encounter medications on file as of 01/30/2021.     Objective:  Patient Care Plan: Diabetes Type 2 (Adult)    Problem Identified: Disease Progression (Diabetes, Type 2)     Long-Range Goal: Disease Progression Prevented or Minimized   Start Date: 01/30/2021  Expected End Date: 05/30/2021  Priority: High  Note:   Objective:  Lab Results  Component Value Date   HGBA1C 8.1 (A) 12/26/2020 .   Lab Results  Component Value Date   CREATININE 1.08 (H) 12/26/2020   CREATININE 1.29 (H) 02/16/2020   CREATININE 0.96 02/08/2019 .   Marland Kitchen No results found for: EGFR  Current Barriers:  . Chronic Disease Management support and educational needs related to Diabetes self-management  Case Manager Clinical Goal(s):  Marland Kitchen Over the next 120 days, patient will demonstrate improved adherence to prescribed treatment plan for Diabetes self management as evidenced by daily monitoring and recording of CBG, adherence to a diabetic/ carb modified  diet, and adherence to prescribed medication regimen. . Over the next 120 days, patient will improve A1C by at least 1 point.  Interventions:  . Collaboration with Alba Cory, MD regarding development and update of comprehensive plan of care as evidenced by provider attestation and  co-signature . Inter-disciplinary care team collaboration (see longitudinal plan of care) . Reviewed medications. Encouraged to take medications as prescribed and notify team with concerns regarding prescription cost. . Provided information regarding importance of consistent blood glucose monitoring. Reports not monitoring for the past two weeks due to needing a new glucometer. Recalls fasting range from the 130's to 160's. She plans to contact her insurance provider to discuss the preferred device and plans to obtain a new glucometer within the next few weeks. Encouraged to monitor consistently and maintain a log. . Reviewed s/sx of hypoglycemia and hyperglycemia along with recommended interventions. . Discussed nutritional intake. Discussed importance of reading nutritional labels and avoiding highly processed foods with a high content of added/concentrated sugars. Encouraged to increase intake of fruits and vegetables. A1C recently increased from 6 % to 8.1%. . Discussed and offered referrals for available Diabetes education classes. Declined current need for referrals or additional resources. Agreed to call with questions or if resources are needed. . Discussed importance of completing recommended DM preventive care. Reports completing recommended foot care and assessing daily. Unable to recall date of last eye exam but denies concerns with vision. Encouraged to complete eye exams at least annually as recommended.  Patient Goals/Self-Care Activities Over the next 120 days, patient will:  - Self administer oral medications as prescribed - Attend all scheduled provider appointments - Monitor blood glucose levels consistently and utilize recommended interventions - Adhere to prescribed diabetic/carb modified - Notify provider or care management team with questions and new concerns   Follow Up Plan:  Will follow up in three months   Patient Care Plan: Hypertension (Adult)    Problem  Identified: Hypertension (Hypertension)     Long-Range Goal: Hypertension Monitored   Start Date: 01/30/2021  Expected End Date: 05/30/2021  Priority: High  Note:   Objective:  . Last practice recorded BP readings:  BP Readings from Last 3 Encounters:  12/26/20 138/70  09/01/19 118/80  02/08/19 130/84 .   Marland Kitchen Most recent eGFR/CrCl: No results found for: EGFR  No components found for: CRCL   Current Barriers:  . Chronic Disease Management support and educational needs r/t Hypertension self-management.  Case Manager Clinical Goal(s):  Marland Kitchen Over the next 120 days, patient will demonstrate improved adherence to prescribed treatment plan for hypertension as evidenced by taking all medications as prescribed, monitoring and recording blood pressure, and adhering to heart healthy/cardiac prudent diet.  Interventions:  . Collaboration with Alba Cory, MD regarding development and update of comprehensive plan of care as evidenced by provider attestation and co-signature . Inter-disciplinary care team collaboration (see longitudinal plan of care) . Reviewed medications. Encouraged to take medications as prescribed and notify provider if unable to tolerate regimen. Encouraged to update the care management team with concerns regarding prescription costs. . Provided information regarding established BP ranges and indications for notifying a provider. Encouraged to monitor and record a few times a week if unable to monitor daily. . Discussed compliance with recommended diet. Encouraged to read nutrition labels and monitor intake of sodium, cholesterol, saturated fats and concentrated sugars. Encouraged to avoid highly processed foods when possible. . Discussed potential cardiac related risks d/t smoking daily. Offered resources and counseling via  Quit.  Declined need for smoking cessation resources. Reports currently smoking approximately 2 cigarettes/day. Reports that she prefers to  continue  decreasing her daily intake until she is able to quit.  . Provided information regarding complications r/t uncontrolled BP such as heart attack and stroke. Discussed worsening symptoms that require immediate medical attention.   Patient Goals/Self-Care Activities Over the next 120 days, patient will: -Self administer medications as prescribed -Attend all scheduled provider appointments -Monitor blood pressure and record readings -Increase compliance with recommended diet -Will consider utilizing available smoking cessation resources -Contact provider or notify the care management team with questions and new concerns   Follow Up Plan:  Will follow up in three months     Ms. Haviland was given information about Chronic Care Management services including:  1. CCM service includes personalized support from designated clinical staff supervised by her physician, including individualized plan of care and coordination with other care providers 2. 24/7 contact phone numbers for assistance for urgent and routine care needs. 3. Service will only be billed when office clinical staff spend 20 minutes or more in a month to coordinate care. 4. Only one practitioner may furnish and bill the service in a calendar month. 5. The patient may stop CCM services at any time (effective at the end of the month) by phone call to the office staff. 6. The patient will be responsible for cost sharing (co-pay) of up to 20% of the service fee (after annual deductible is met).  Patient agreed to services and verbal consent obtained.    PLAN -A Care Guide referral was submitted for assistance with available financial resources. Outreach Pending. -Will follow up for care plan updates within the next three months.    Cristy Friedlander [facility] Care Management [facility] [address] [phone]

## 2021-02-03 NOTE — Patient Instructions (Addendum)
Thank you for allowing the Chronic Care Management team to participate in your care.   Patient Care Plan: Diabetes Type 2 (Adult)    Problem Identified: Disease Progression (Diabetes, Type 2)     Long-Range Goal: Disease Progression Prevented or Minimized   Start Date: 01/30/2021  Expected End Date: 05/30/2021  Priority: High  Note:   Objective:  Lab Results  Component Value Date   HGBA1C 8.1 (A) 12/26/2020 .   Lab Results  Component Value Date   CREATININE 1.08 (H) 12/26/2020   CREATININE 1.29 (H) 02/16/2020   CREATININE 0.96 02/08/2019 .   Marland Kitchen No results found for: EGFR  Current Barriers:  . Chronic Disease Management support and educational needs related to Diabetes self-management  Case Manager Clinical Goal(s):  Marland Kitchen Over the next 120 days, patient will demonstrate improved adherence to prescribed treatment plan for Diabetes self management as evidenced by daily monitoring and recording of CBG, adherence to a diabetic/ carb modified diet, and adherence to prescribed medication regimen. . Over the next 120 days, patient will improve A1C by at least 1 point.  Interventions:  . Collaboration with Steele Sizer, MD regarding development and update of comprehensive plan of care as evidenced by provider attestation and co-signature . Inter-disciplinary care team collaboration (see longitudinal plan of care) . Reviewed medications. Encouraged to take medications as prescribed and notify team with concerns regarding prescription cost. . Provided information regarding importance of consistent blood glucose monitoring. Reports not monitoring for the past two weeks due to needing a new glucometer. Recalls fasting range from the 130's to 160's. She plans to contact her insurance provider to discuss the preferred device and plans to obtain a new glucometer within the next few weeks. Encouraged to monitor consistently and maintain a log. . Reviewed s/sx of hypoglycemia and hyperglycemia along  with recommended interventions. . Discussed nutritional intake. Discussed importance of reading nutritional labels and avoiding highly processed foods with a high content of added/concentrated sugars. Encouraged to increase intake of fruits and vegetables. A1C recently increased from 6 % to 8.1%. . Discussed and offered referrals for available Diabetes education classes. Declined current need for referrals or additional resources. Agreed to call with questions or if resources are needed. . Discussed importance of completing recommended DM preventive care. Reports completing recommended foot care and assessing daily. Unable to recall date of last eye exam but denies concerns with vision. Encouraged to complete eye exams at least annually as recommended.  Patient Goals/Self-Care Activities Over the next 120 days, patient will:  - Self administer oral medications as prescribed - Attend all scheduled provider appointments - Monitor blood glucose levels consistently and utilize recommended interventions - Adhere to prescribed diabetic/carb modified - Notify provider or care management team with questions and new concerns   Follow Up Plan:  Will follow up in three months   Patient Care Plan: Hypertension (Adult)    Problem Identified: Hypertension (Hypertension)     Long-Range Goal: Hypertension Monitored   Start Date: 01/30/2021  Expected End Date: 05/30/2021  Priority: High  Note:   Objective:  . Last practice recorded BP readings:  BP Readings from Last 3 Encounters:  12/26/20 138/70  09/01/19 118/80  02/08/19 130/84 .   Marland Kitchen Most recent eGFR/CrCl: No results found for: EGFR  No components found for: CRCL   Current Barriers:  . Chronic Disease Management support and educational needs r/t Hypertension self-management.  Case Manager Clinical Goal(s):  Marland Kitchen Over the next 120 days, patient will demonstrate improved  adherence to prescribed treatment plan for hypertension as evidenced by  taking all medications as prescribed, monitoring and recording blood pressure, and adhering to heart healthy/cardiac prudent diet.  Interventions:  . Collaboration with Steele Sizer, MD regarding development and update of comprehensive plan of care as evidenced by provider attestation and co-signature . Inter-disciplinary care team collaboration (see longitudinal plan of care) . Reviewed medications. Encouraged to take medications as prescribed and notify provider if unable to tolerate regimen. Encouraged to update the care management team with concerns regarding prescription costs. . Provided information regarding established BP ranges and indications for notifying a provider. Encouraged to monitor and record a few times a week if unable to monitor daily. . Discussed compliance with recommended diet. Encouraged to read nutrition labels and monitor intake of sodium, cholesterol, saturated fats and concentrated sugars. Encouraged to avoid highly processed foods when possible. . Discussed potential cardiac related risks d/t smoking daily. Offered resources and counseling via [place] [facility]. Declined need for smoking cessation resources. Reports currently smoking approximately 2 cigarettes/day. Reports that she prefers to  continue decreasing her daily intake until she is able to quit.  . Provided information regarding complications r/t uncontrolled BP such as heart attack and stroke. Discussed worsening symptoms that require immediate medical attention.   Patient Goals/Self-Care Activities Over the next 120 days, patient will: -Self administer medications as prescribed -Attend all scheduled provider appointments -Monitor blood pressure and record readings -Increase compliance with recommended diet -Will consider utilizing available smoking cessation resources -Contact provider or notify the care management team with questions and new concerns   Follow Up Plan:  Will follow up in three months              Janice Short was given information about Chronic Care Management services including:  1. CCM service includes personalized support from designated clinical staff supervised by her physician, including individualized plan of care and coordination with other care providers 2. 24/7 contact phone numbers for assistance for urgent and routine care needs. 3. Service will only be billed when office clinical staff spend 20 minutes or more in a month to coordinate care. 4. Only one practitioner may furnish and bill the service in a calendar month. 5. The patient may stop CCM services at any time (effective at the end of the month) by phone call to the office staff. 6. The patient will be responsible for cost sharing (co-pay) of up to 20% of the service fee (after annual deductible is met).  Patient agreed to services and verbal consent obtained.    Janice Short verbalized understanding of the information discussed during the telephonic outreach today. Declined need for mailed/printed instructions or resources. A Care Guide will follow up to discuss concerns regarding available financial resources. Will follow up for care plan updates within the next three months.     Cristy Friedlander [facility] Care Management [facility] [phone]

## 2021-02-04 ENCOUNTER — Telehealth: Payer: Self-pay

## 2021-02-04 NOTE — Telephone Encounter (Signed)
   Telephone encounter was:  Successful.  02/04/2021 Name: Janice Short MRN: [id number] DOB: [birth year]  Janice Short is a 60 y.o. year old female who is a primary care patient of Steele Sizer, MD . The community resource team was consulted for assistance with assistance with utilities  Care guide performed the following interventions: Patient provided with information about care guide support team and interviewed to confirm resource needs.  Follow Up Plan:  Care guide will follow up with patient by phone over the next 7 days and Care guide will outreach resources to assist patient with assistance with utilities  Tyrin Herbers, AAS Paralegal, [place] . Embedded Care Coordination [facility] Health  Care Management  [address], [place] [address] ??[email or web address]  ?? [phone]   [email or web address]

## 2021-02-05 ENCOUNTER — Telehealth: Payer: Self-pay

## 2021-02-05 NOTE — Telephone Encounter (Signed)
   Telephone encounter was:  Successful.  02/05/2021 Name: [name] MRN: [id number] DOB: [birth year]  Janice Short is a 60 y.o. year old female who is a primary care patient of Steele Sizer, MD . The community resource team was consulted for assistance with utility assistance  Care guide performed the following interventions: Patient provided with information about care guide support team and interviewed to confirm resource needs Investigation of community resources performed Confirmed with [employer] that there are funds available to assist with utility bills. Gave patient number to call make appointment.  Also gave information for Energy Assistance Program..  Follow Up Plan:  Care guide will follow up with patient by phone over the next 7 days  Chanee Henrickson, AAS Paralegal, [place] . Embedded Care Coordination [facility] Health  Care Management  [address], [place] [address] ??[email or web address]  ?? [phone]   [email or web address]

## 2021-02-11 ENCOUNTER — Telehealth: Payer: Self-pay

## 2021-02-11 NOTE — Telephone Encounter (Signed)
   Telephone encounter was:  Successful.  02/11/2021 Name: Janice Short MRN: [id number] DOB: 1961-04-06  Janice Short is a 60 y.o. year old female who is a primary care patient of Steele Sizer, MD . The community resource team was consulted for assistance with utility assistance.    Care guide performed the following interventions: Follow up call placed to the patient to discuss status of referral  Patient contacted [employer], [employer] for assistance both are out of funds. Asked patient to email electric bill to me and I will submit it to [facility] for assistance..  Follow Up Plan:  Care guide will follow up with patient by phone over the next 5 days  Janice Short, AAS Paralegal, [place] . Embedded Care Coordination [facility] Health  Care Management  [address], [place] [address] ??[email or web address]  ?? [phone]   [email or web address]

## 2021-02-12 ENCOUNTER — Telehealth: Payer: Self-pay

## 2021-02-12 NOTE — Telephone Encounter (Signed)
   Telephone encounter was:  Successful.  02/12/2021 Name: Janice Short MRN: [id number] DOB: 12-31-1960  Janice Short is a 60 y.o. year old female who is a primary care patient of Steele Sizer, MD . The community resource team was consulted for assistance with utility assistance  Care guide performed the following interventions: Obtained verbal consent to place patient referral to [facility] Follow up call placed to the patient to discuss status of referral.  Follow Up Plan:  Care guide will follow up with patient by phone over the next 7 days and I will contact the patient as soon as I receive a response from [place] at [facility], [place], [place] . Embedded Care Coordination [facility] Health  Care Management  [address], [place] [address] ??[email or web address]  ?? [phone]   [email or web address]

## 2021-02-14 ENCOUNTER — Telehealth: Payer: Self-pay

## 2021-02-14 NOTE — Telephone Encounter (Signed)
   Telephone encounter was:  Successful.  02/14/2021 Name: Janice Short MRN: [id number] DOB: [birth year]  Janice Short is a 60 y.o. year old female who is a primary care patient of Steele Sizer, MD . The community resource team was consulted for assistance with assistance with [place] guide performed the following interventions: Follow up call placed to community resources to determine status of patients referral Follow up call placed to the patient to discuss status of referral Called patient to let her know [employer] would send payment directly to [place] and she could check with them in a few days.  .  Follow Up Plan:  No further follow up planned at this time. The patient has been provided with needed resources.  Janice Short, AAS Paralegal, [place] . Embedded Care Coordination [facility] Health  Care Management  [address],  [address] ??[email or web address]  ?? [phone]   [email or web address]

## 2021-02-19 ENCOUNTER — Other Ambulatory Visit: Payer: Self-pay | Admitting: Family Medicine

## 2021-02-19 DIAGNOSIS — J302 Other seasonal allergic rhinitis: Secondary | ICD-10-CM

## 2021-02-19 DIAGNOSIS — J3089 Other allergic rhinitis: Secondary | ICD-10-CM

## 2021-02-19 NOTE — Telephone Encounter (Signed)
Requested medication (s) are due for refill today:   Yes  Requested medication (s) are on the active medication list:   Yes  Future visit scheduled:   Yes   Last ordered: 02/08/2019 48 g, 0 refills by Raelyn Ensign, NP  Clinic note:  Returned because been 2 yrs since this was ordered.   Also ordered from prior provider that was with the practice Raelyn Ensign   Requested Prescriptions  Pending Prescriptions Disp Refills   fluticasone (FLONASE) 50 MCG/ACT nasal spray [Pharmacy Med Name: Fluticasone Propionate 50 MCG/ACT Nasal Suspension] 16 g 0    Sig: USE 2 SPRAY(S) IN EACH NOSTRIL AT BEDTIME      Ear, Nose, and Throat: Nasal Preparations - Corticosteroids Passed - 02/19/2021 11:59 AM      Passed - Valid encounter within last 12 months    Recent Outpatient Visits           1 month ago Uncontrolled type 2 diabetes mellitus with microalbuminuria [facility])   [facility] Steele Sizer, MD   1 year ago Vitamin D deficiency   [facility] [place], Drue Stager, MD   1 year ago Uncontrolled type 2 diabetes mellitus with microalbuminuria [facility])   [facility] Steele Sizer, MD   1 year ago Mild episode of recurrent major depressive disorder [facility])   [facility] Steele Sizer, MD   1 year ago Mild episode of recurrent major depressive disorder [facility])   [place], FNP       Future Appointments             In 2 months Ancil Boozer, Drue Stager, MD [facility], [place]   In 11 months  [facility], [facility]

## 2021-03-24 ENCOUNTER — Other Ambulatory Visit: Payer: Self-pay

## 2021-03-24 ENCOUNTER — Emergency Department
Admission: EM | Admit: 2021-03-24 | Discharge: 2021-03-24 | Disposition: A | Discharge status: Home / Self Care | Payer: Medicare HMO | Attending: Emergency Medicine | Admitting: Emergency Medicine

## 2021-03-24 ENCOUNTER — Emergency Department: Payer: Medicare HMO

## 2021-03-24 DIAGNOSIS — K8020 Calculus of gallbladder without cholecystitis without obstruction: Secondary | ICD-10-CM | POA: Diagnosis not present

## 2021-03-24 DIAGNOSIS — Z7982 Long term (current) use of aspirin: Secondary | ICD-10-CM | POA: Diagnosis not present

## 2021-03-24 DIAGNOSIS — R1011 Right upper quadrant pain: Secondary | ICD-10-CM | POA: Diagnosis not present

## 2021-03-24 DIAGNOSIS — I10 Essential (primary) hypertension: Secondary | ICD-10-CM | POA: Insufficient documentation

## 2021-03-24 DIAGNOSIS — R112 Nausea with vomiting, unspecified: Secondary | ICD-10-CM | POA: Diagnosis not present

## 2021-03-24 DIAGNOSIS — Z79899 Other long term (current) drug therapy: Secondary | ICD-10-CM | POA: Insufficient documentation

## 2021-03-24 DIAGNOSIS — R110 Nausea: Secondary | ICD-10-CM | POA: Diagnosis not present

## 2021-03-24 DIAGNOSIS — E1169 Type 2 diabetes mellitus with other specified complication: Secondary | ICD-10-CM | POA: Diagnosis not present

## 2021-03-24 DIAGNOSIS — F17210 Nicotine dependence, cigarettes, uncomplicated: Secondary | ICD-10-CM | POA: Diagnosis not present

## 2021-03-24 DIAGNOSIS — J4520 Mild intermittent asthma, uncomplicated: Secondary | ICD-10-CM | POA: Insufficient documentation

## 2021-03-24 DIAGNOSIS — E785 Hyperlipidemia, unspecified: Secondary | ICD-10-CM | POA: Diagnosis not present

## 2021-03-24 DIAGNOSIS — Z743 Need for continuous supervision: Secondary | ICD-10-CM | POA: Diagnosis not present

## 2021-03-24 DIAGNOSIS — E1129 Type 2 diabetes mellitus with other diabetic kidney complication: Secondary | ICD-10-CM | POA: Diagnosis not present

## 2021-03-24 DIAGNOSIS — Z7951 Long term (current) use of inhaled steroids: Secondary | ICD-10-CM | POA: Insufficient documentation

## 2021-03-24 DIAGNOSIS — R000 Tachycardia, unspecified: Secondary | ICD-10-CM | POA: Diagnosis not present

## 2021-03-24 DIAGNOSIS — R932 Abnormal findings on diagnostic imaging of liver and biliary tract: Secondary | ICD-10-CM | POA: Insufficient documentation

## 2021-03-24 DIAGNOSIS — Z20822 Contact with and (suspected) exposure to covid-19: Secondary | ICD-10-CM | POA: Diagnosis not present

## 2021-03-24 DIAGNOSIS — E1142 Type 2 diabetes mellitus with diabetic polyneuropathy: Secondary | ICD-10-CM | POA: Diagnosis not present

## 2021-03-24 DIAGNOSIS — R1010 Upper abdominal pain, unspecified: Secondary | ICD-10-CM | POA: Insufficient documentation

## 2021-03-24 DIAGNOSIS — C220 Liver cell carcinoma: Secondary | ICD-10-CM | POA: Diagnosis not present

## 2021-03-24 DIAGNOSIS — R160 Hepatomegaly, not elsewhere classified: Secondary | ICD-10-CM

## 2021-03-24 DIAGNOSIS — Z794 Long term (current) use of insulin: Secondary | ICD-10-CM | POA: Insufficient documentation

## 2021-03-24 DIAGNOSIS — M25512 Pain in left shoulder: Secondary | ICD-10-CM | POA: Insufficient documentation

## 2021-03-24 DIAGNOSIS — R109 Unspecified abdominal pain: Secondary | ICD-10-CM | POA: Diagnosis not present

## 2021-03-24 DIAGNOSIS — K769 Liver disease, unspecified: Secondary | ICD-10-CM

## 2021-03-24 DIAGNOSIS — R531 Weakness: Secondary | ICD-10-CM | POA: Diagnosis not present

## 2021-03-24 LAB — RESP PANEL BY RT-PCR (FLU A&B, COVID) ARPGX2
Influenza A by PCR: NEGATIVE
Influenza B by PCR: NEGATIVE
SARS Coronavirus 2 by RT PCR: NEGATIVE

## 2021-03-24 LAB — CBC
HCT: 40 % (ref 36.0–46.0)
Hemoglobin: 13.7 g/dL (ref 12.0–15.0)
MCH: 31.5 pg (ref 26.0–34.0)
MCHC: 34.3 g/dL (ref 30.0–36.0)
MCV: 92 fL (ref 80.0–100.0)
Platelets: 313 10*3/uL (ref 150–400)
RBC: 4.35 MIL/uL (ref 3.87–5.11)
RDW: 14.8 % (ref 11.5–15.5)
WBC: 11.9 10*3/uL — ABNORMAL HIGH (ref 4.0–10.5)
nRBC: 0 % (ref 0.0–0.2)

## 2021-03-24 LAB — HEPATIC FUNCTION PANEL
ALT: 41 U/L (ref 0–44)
AST: 35 U/L (ref 15–41)
Albumin: 3.5 g/dL (ref 3.5–5.0)
Alkaline Phosphatase: 619 U/L — ABNORMAL HIGH (ref 38–126)
Bilirubin, Direct: 0.7 mg/dL — ABNORMAL HIGH (ref 0.0–0.2)
Indirect Bilirubin: 1.3 mg/dL — ABNORMAL HIGH (ref 0.3–0.9)
Total Bilirubin: 2 mg/dL — ABNORMAL HIGH (ref 0.3–1.2)
Total Protein: 8.8 g/dL — ABNORMAL HIGH (ref 6.5–8.1)

## 2021-03-24 LAB — TROPONIN I (HIGH SENSITIVITY)
Troponin I (High Sensitivity): 9 ng/L (ref <18)
Troponin I (High Sensitivity): 8 ng/L (ref <18)

## 2021-03-24 LAB — BASIC METABOLIC PANEL
Anion gap: 12 (ref 5–15)
BUN: 19 mg/dL (ref 8–23)
CO2: 20 mmol/L — ABNORMAL LOW (ref 22–32)
Calcium: 9.4 mg/dL (ref 8.9–10.3)
Chloride: 104 mmol/L (ref 98–111)
Creatinine, Ser: 1.11 mg/dL — ABNORMAL HIGH (ref 0.44–1.00)
GFR, Estimated: 56 mL/min — ABNORMAL LOW (ref >60)
Glucose, Bld: 225 mg/dL — ABNORMAL HIGH (ref 70–99)
Potassium: 4.4 mmol/L (ref 3.5–5.1)
Sodium: 136 mmol/L (ref 135–145)

## 2021-03-24 LAB — LIPASE, BLOOD: Lipase: 46 U/L (ref 11–51)

## 2021-03-24 LAB — ETHANOL: Alcohol, Ethyl (B): <10 mg/dL (ref <10)

## 2021-03-24 MED ORDER — SODIUM CHLORIDE 0.9 % IV BOLUS
1000.0000 mL | Freq: Once | INTRAVENOUS | Status: AC
  Administered 2021-03-24: 1000 mL via INTRAVENOUS

## 2021-03-24 MED ORDER — ONDANSETRON HCL 4 MG/2ML IJ SOLN
4.0000 mg | Freq: Once | INTRAMUSCULAR | Status: AC
  Administered 2021-03-24: 4 mg via INTRAVENOUS
  Filled 2021-03-24: qty 2

## 2021-03-24 MED ORDER — ONDANSETRON 4 MG PO TBDP
4.0000 mg | ORAL_TABLET | Freq: Three times a day (TID) | ORAL | 0 refills | Status: DC | PRN
Start: 2021-03-24 — End: 2021-04-21

## 2021-03-24 MED ORDER — GADOBUTROL 1 MMOL/ML IV SOLN
7.0000 mL | Freq: Once | INTRAVENOUS | Status: AC | PRN
  Administered 2021-03-24: 7 mL via INTRAVENOUS

## 2021-03-24 MED ORDER — FAMOTIDINE IN NACL 20-0.9 MG/50ML-% IV SOLN
20.0000 mg | Freq: Once | INTRAVENOUS | Status: AC
  Administered 2021-03-24: 20 mg via INTRAVENOUS
  Filled 2021-03-24: qty 50

## 2021-03-24 NOTE — ED Notes (Signed)
Pt assisted in calling son

## 2021-03-24 NOTE — Discharge Instructions (Signed)
Dr. Janese Banks is going to have you come in to the [place] within the next 2 days for further evaluation of the liver mass. They will arrange for a biopsy to determine what this mass is

## 2021-03-24 NOTE — ED Notes (Signed)
Pt wheeled to triage restroom per request.

## 2021-03-24 NOTE — ED Notes (Signed)
Repeat SST (3), LG, Lav sent to lab

## 2021-03-24 NOTE — ED Triage Notes (Signed)
Pt complains of nausea, left shoulder pain and upper abd pain for two days. Pt states she believes the "pollen" is bothering her. Pt states she has "been a dry heavin". Tachycardia noted.

## 2021-03-24 NOTE — ED Notes (Signed)
Pt in MRI.

## 2021-03-24 NOTE — ED Provider Notes (Signed)
[facility] Emergency Department Provider Note   ____________________________________________   Event Date/Time   First MD Initiated Contact with Patient 03/24/21 0518     (approximate)  I have reviewed the triage vital signs and the nursing notes.   HISTORY  Chief Complaint Nausea and left shoulder pain    HPI CLEA Janice Short is a 60 y.o. female who presents to the ED from home with a chief complaint of nausea, upper abdominal pain, left shoulder pain.  Patient states she was at a friend's house who had no air conditioning and believes she breathed in a lot of pollen which is bothering her.  Reports a 2-day history of upper abdominal pain radiating to her left shoulder accompanied by nausea.  Denies associated fever, chills, cough, chest pain, shortness of breath, vomiting, dysuria or diarrhea.     Past Medical History:  Diagnosis Date  . Allergy   . Asthma   . Carpal tunnel syndrome on left   . Cervical radiculitis   . Chronic osteoarthritis   . Corns and callosity   . Depression   . Dermatophytosis of foot   . Diabetes mellitus without complication ([place])   . Gout   . Hyperlipidemia   . Hypertension   . Impingement syndrome of left shoulder   . Lumbosacral neuritis   . Microalbuminuria   . Obesity   . Supraventricular tachycardia ([place]) 07/01/2015  . SVT (supraventricular tachycardia) ([place])   . Tenosynovitis of wrist   . Vitamin D deficiency     Patient Active Problem List   Diagnosis Date Noted  . Mild episode of recurrent major depressive disorder ([place]) 02/08/2019  . Dyslipidemia associated with type 2 diabetes mellitus ([place]) 12/17/2017  . Noncompliance with medication regimen 12/17/2017  . Spinal stenosis 10/15/2016  . Diabetic polyneuropathy associated with type 2 diabetes mellitus ([place]) 07/14/2016  . Chronic midline low back pain with sciatica 06/22/2016  . Primary osteoarthritis of both knees 06/22/2016  . Carpal tunnel syndrome  07/01/2015  . Cervical radiculitis 07/01/2015  . Corn or callus 07/01/2015  . Impingement syndrome of shoulder 07/01/2015  . Microalbuminuria 07/01/2015  . Compulsive tobacco user syndrome 07/01/2015  . Tenosynovitis of wrist 07/01/2015  . Benign hypertension 11/05/2009  . Uncontrolled type 2 diabetes mellitus with microalbuminuria ([place]) 11/05/2009  . Athlete's foot 08/05/2009  . Combined fat and carbohydrate induced hyperlipemia 01/08/2009  . Vitamin D deficiency 01/08/2009  . Asthma, mild intermittent 12/28/2008  . Allergic rhinitis 12/28/2008  . Lumbosacral neuritis 08/11/2007    Past Surgical History:  Procedure Laterality Date  . ABDOMINAL HYSTERECTOMY      Prior to Admission medications   Medication Sig Start Date End Date Taking? Authorizing Provider  acetaminophen (TYLENOL) 500 MG tablet Take 1 tablet by mouth at bedtime as needed.    [provider]  albuterol (VENTOLIN HFA) 108 (90 Base) MCG/ACT inhaler INHALE 2 PUFFS BY MOUTH EVERY 6 HOURS AS NEEDED FOR WHEEZING AND FOR SHORTNESS OF BREATH 02/15/20   Steele Sizer, MD  aspirin 81 MG tablet Take 1 tablet by mouth daily. 11/05/09   [provider]  atorvastatin (LIPITOR) 40 MG tablet Take 1 tablet (40 mg total) by mouth at bedtime. 12/26/20   Steele Sizer, MD  BAYER MICROLET LANCETS lancets Use as instructed 02/08/19   Hubbard Hartshorn, FNP  Empagliflozin-metFORMIN HCl ER (SYNJARDY XR) 12.04-999 MG TB24 Take 2 tablets by mouth daily. New dose 12/26/20   Steele Sizer, MD  fluticasone (FLONASE) 50  MCG/ACT nasal spray USE 2 SPRAY(S) IN EACH NOSTRIL AT BEDTIME 02/19/21   Sowles, Drue Stager, MD  Fluticasone-Umeclidin-Vilant (TRELEGY ELLIPTA) 100-62.5-25 MCG/INH AEPB Take 1 puff by mouth daily. 12/26/20   Steele Sizer, MD  gabapentin (NEURONTIN) 300 MG capsule Take 2 capsules (600 mg total) by mouth 2 (two) times daily. 12/26/20   Steele Sizer, MD  glucose blood (BAYER CONTOUR TEST) test strip Use as instructed  02/08/19   Hubbard Hartshorn, FNP  Insulin Degludec-Liraglutide (XULTOPHY) 100-3.6 UNIT-MG/ML SOPN Inject 50 Units into the skin daily. 12/26/20   Steele Sizer, MD  Insulin Pen Needle 31G X 8 MM MISC 1 each by Does not apply route daily. 12/26/20   Steele Sizer, MD  losartan-hydrochlorothiazide (HYZAAR) 50-12.5 MG tablet Take 1 tablet by mouth daily. 12/26/20   Steele Sizer, MD  Vitamin D, Ergocalciferol, (DRISDOL) 1.25 MG (50000 UNIT) CAPS capsule Take 1 capsule (50,000 Units total) by mouth every 7 (seven) days. 12/26/20   Steele Sizer, MD    Allergies Other and Bydureon [exenatide]  Family History  Problem Relation Age of Onset  . Heart attack Mother   . CAD Mother   . Kidney disease Father     Social History Social History   Tobacco Use  . Smoking status: Current Some Day Smoker    Packs/day: 0.25    Types: Cigarettes  . Smokeless tobacco: Never Used  . Tobacco comment: 2 per day 01/30/21  Vaping Use  . Vaping Use: Never used  Substance Use Topics  . Alcohol use: No    Alcohol/week: 0.0 standard drinks  . Drug use: No    Review of Systems  Constitutional: No fever/chills Eyes: No visual changes. ENT: No sore throat. Cardiovascular: Denies chest pain. Respiratory: Denies shortness of breath. Gastrointestinal: Positive for upper abdominal pain and nausea, no vomiting.  No diarrhea.  No constipation. Genitourinary: Negative for dysuria. Musculoskeletal: Negative for back pain. Skin: Negative for rash. Neurological: Negative for headaches, focal weakness or numbness.   ____________________________________________   PHYSICAL EXAM:  VITAL SIGNS: ED Triage Vitals  Enc Vitals Group     BP 03/24/21 0314 (!) 136/93     Pulse Rate 03/24/21 0314 (!) 122     Resp 03/24/21 0314 20     Temp 03/24/21 0314 97.7 F (36.5 C)     Temp Source 03/24/21 0311 Oral     SpO2 03/24/21 0314 98 %     Weight 03/24/21 0314 170 lb (77.1 kg)     Height 03/24/21 0314 5\' 6"  (1.676  m)     Head Circumference --      Peak Flow --      Pain Score 03/24/21 0314 8     Pain Loc --      Pain Edu? --      Excl. in [place]? --     Constitutional: Alert and oriented. Well appearing and in no acute distress. Eyes: Conjunctivae are normal. PERRL. EOMI. Head: Atraumatic. Nose: No congestion/rhinnorhea. Mouth/Throat: Mucous membranes are moist.  Oropharynx non-erythematous. Neck: No stridor.   Cardiovascular: Normal rate, regular rhythm. Grossly normal heart sounds.  Good peripheral circulation. Respiratory: Normal respiratory effort.  No retractions. Lungs CTAB. Gastrointestinal: Soft and nontender. No distention. No abdominal bruits. No CVA tenderness. Musculoskeletal: No lower extremity tenderness nor edema.  No joint effusions. Neurologic:  Normal speech and language. No gross focal neurologic deficits are appreciated. No gait instability. Skin:  Skin is warm, dry and intact. No rash noted. Psychiatric: Mood and affect  are normal. Speech and behavior are normal.  ____________________________________________   LABS (all labs ordered are listed, but only abnormal results are displayed)  Labs Reviewed  BASIC METABOLIC PANEL - Abnormal; Notable for the following components:      Result Value   CO2 20 (*)    Glucose, Bld 225 (*)    Creatinine, Ser 1.11 (*)    GFR, Estimated 56 (*)    All other components within normal limits  CBC - Abnormal; Notable for the following components:   WBC 11.9 (*)    All other components within normal limits  HEPATIC FUNCTION PANEL - Abnormal; Notable for the following components:   Total Protein 8.8 (*)    Alkaline Phosphatase 619 (*)    Total Bilirubin 2.0 (*)    Bilirubin, Direct 0.7 (*)    Indirect Bilirubin 1.3 (*)    All other components within normal limits  RESP PANEL BY RT-PCR (FLU A&B, COVID) ARPGX2  LIPASE, BLOOD  ETHANOL  TROPONIN I (HIGH SENSITIVITY)  TROPONIN I (HIGH SENSITIVITY)    ____________________________________________  EKG  ED ECG REPORT I, Kden Wagster J, the attending physician, personally viewed and interpreted this ECG.   Date: 03/24/2021  EKG Time: 0317  Rate: 112  Rhythm: sinus tachycardia  Axis: Normal  Intervals:none  ST&T Change: Nonspecific  ____________________________________________  RADIOLOGY I, Ayauna Mcnay J, personally viewed and evaluated these images (plain radiographs) as part of my medical decision making, as well as reviewing the written report by the radiologist.  ED MD interpretation: No acute cardiopulmonary process; ultrasound discussed with radiologist concerning for liver mass, dilated CBD  Official radiology report(s): DG Chest 2 View  Result Date: 03/24/2021 CLINICAL DATA:  Left shoulder pain, upper abdominal pain EXAM: CHEST - 2 VIEW COMPARISON:  None. FINDINGS: Heart and mediastinal contours are within normal limits. No focal opacities or effusions. No acute bony abnormality. IMPRESSION: No active cardiopulmonary disease. Electronically Signed   By: Rolm Baptise M.D.   On: 03/24/2021 03:50   US ABDOMEN LIMITED RUQ (LIVER/GB)  Addendum Date: 03/24/2021   ADDENDUM REPORT: 03/24/2021 06:15 ADDENDUM: These results were called by telephone at the time of interpretation on 03/24/2021 at 6:15 am to provider Daizy Outen , who verbally acknowledged these results. Electronically Signed   By: Lovena Le M.D.   On: 03/24/2021 06:15   Result Date: 03/24/2021 CLINICAL DATA:  Nausea and abdominal pain for 2 days EXAM: ULTRASOUND ABDOMEN LIMITED RIGHT UPPER QUADRANT COMPARISON:  None. FINDINGS: Gallbladder: Mild gallbladder distension. Some dependently layering shadowing gallstones and sludge are present. No significant gallbladder wall thickening. Sonographic Percell Miller sign is reportedly negative. Common bile duct: Diameter: 6.5 mm, borderline dilated. Liver: Diffusely inhomogeneous appearance of the liver with coarsened hepatic echogenicity  and a nodular liver surface contour. Enlarged left lobe and caudate are noted. Multiple heterogeneous though predominantly hypoechoic masses are seen throughout the right lobe liver, largest measuring up to 5.3 cm, second largest up to 3.3 cm. No visible intrahepatic biliary ductal dilatation. Portal vein is patent on color Doppler imaging with normal direction of blood flow towards the liver. Other: Several small nodular masses are seen in the vicinity of the pancreatic head, possibly porta hepatic lymph nodes though difficult to fully delineate given adjacent bowel gas. IMPRESSION: 1. Diffusely heterogeneous appearing liver with mildly nodular surface contour, could reflect a cirrhotic configuration or other intrinsic liver disease. 2. Multiple heterogeneous though predominantly hypoechoic masses in the right lobe liver, largest measuring up to 5.3 cm. Findings are  highly suspicious for multifocal hepatocellular carcinoma versus metastatic disease. Consider further evaluation with dedicated liver MR. 3. Several small nodular masses in the vicinity of the pancreatic head and possibly porta hepatic lymph nodes. Could reflect adenopathy or other mass lesion. 4. Mild gallbladder distension with some dependently layering gallstones and sludge. No significant gallbladder wall thickening or sonographic Murphy sign to suggest acute cholecystitis. 5. Borderline dilatation of the common bile duct without visible intrahepatic biliary ductal dilatation. Correlate with liver serologies and consider addition of MRCP imaging where clinically appropriate. Electronically Signed: By: Lovena Le M.D. On: 03/24/2021 06:09    ____________________________________________   PROCEDURES  Procedure(s) performed (including Critical Care):  .1-3 Lead EKG Interpretation Performed by: Paulette Blanch, MD Authorized by: Paulette Blanch, MD     Interpretation: abnormal     ECG rate:  112   ECG rate assessment: tachycardic      Rhythm: sinus tachycardia     Ectopy: none     Conduction: normal   Comments:     Patient placed on cardiac monitor to evaluate for arrhythmias     ____________________________________________   INITIAL IMPRESSION / ASSESSMENT AND PLAN / ED COURSE  As part of my medical decision making, I reviewed the following data within the [place] notes reviewed and incorporated, Labs reviewed, EKG interpreted, Old chart reviewed, Radiograph reviewed and Notes from prior ED visits     60 year old female presenting with upper abdominal pain and nausea. Differential diagnosis includes, but is not limited to, biliary disease (biliary colic, acute cholecystitis, cholangitis, choledocholithiasis, etc), intrathoracic causes for epigastric abdominal pain including ACS, gastritis, duodenitis, pancreatitis, small bowel or large bowel obstruction, abdominal aortic aneurysm, hernia, and ulcer(s).  Initial labs unremarkable.  Will check LFTs/lipase as well as repeat troponin.  Initiate IV fluid resuscitation, IV Zofran for nausea, IV Pepcid.  We will start with RUQ ultrasound.  Will reassess.  Clinical Course as of 03/24/21 0653  Mon Mar 24, 2021  0619 Spoke with Dr. Marguerita Merles from radiology regarding patient's abdomen ultrasound.  Will proceed with MRI abdomen to evaluate liver as well as MRCP. [JS]  [id number] Patient feeling better.  Denies history of cirrhosis or liver dysfunction.  She has been screened for MRI and will proceed shortly.  Care will be transferred to the oncoming provider pending results of MRI abdomen/MRCP and disposition. [JS]    Clinical Course User Index [JS] Paulette Blanch, MD     ____________________________________________   FINAL CLINICAL IMPRESSION(S) / ED DIAGNOSES  Final diagnoses:  Nausea  Pain of upper abdomen     ED Discharge Orders    None      *Please note:  Janice Short was evaluated in Emergency Department on 03/24/2021 for the symptoms  described in the history of present illness. She was evaluated in the context of the global COVID-19 pandemic, which necessitated consideration that the patient might be at risk for infection with the SARS-CoV-2 virus that causes COVID-19. Institutional protocols and algorithms that pertain to the evaluation of patients at risk for COVID-19 are in a state of rapid change based on information released by regulatory bodies including the CDC and federal and state organizations. These policies and algorithms were followed during the patient's care in the ED.  Some ED evaluations and interventions may be delayed as a result of limited staffing during and the pandemic.*   Note:  This document was prepared using Dragon voice recognition software and may include unintentional  dictation errors.   Paulette Blanch, MD 03/24/21 [phone]

## 2021-03-24 NOTE — ED Provider Notes (Signed)
Discussed MRI results with Dr. Janese Banks of oncology, she will arrange for follow-up in clinic within 1 to 2 days and coordinate with GI and IR for likely biopsy.  Additional labs ordered as per her instructions  Discussed MRI results with patient and the possibility of cancer as a diagnosis.  Patient is aware and will follow up closely with Dr. Ivery Quale, Herbie Baltimore, MD 03/24/21 1027

## 2021-03-24 NOTE — ED Notes (Signed)
Lab contacted to add on labs, will call back if unable

## 2021-03-25 ENCOUNTER — Inpatient Hospital Stay: Payer: Medicare HMO | Attending: Oncology | Admitting: Oncology

## 2021-03-25 ENCOUNTER — Encounter: Payer: Self-pay | Admitting: Oncology

## 2021-03-25 ENCOUNTER — Other Ambulatory Visit: Payer: Self-pay | Admitting: *Deleted

## 2021-03-25 VITALS — BP 103/68 | HR 113 | Temp 96.8°F | Resp 20 | Wt 157.4 lb

## 2021-03-25 DIAGNOSIS — R599 Enlarged lymph nodes, unspecified: Secondary | ICD-10-CM | POA: Diagnosis not present

## 2021-03-25 DIAGNOSIS — K769 Liver disease, unspecified: Secondary | ICD-10-CM | POA: Diagnosis not present

## 2021-03-25 DIAGNOSIS — Z794 Long term (current) use of insulin: Secondary | ICD-10-CM | POA: Diagnosis not present

## 2021-03-25 DIAGNOSIS — F17210 Nicotine dependence, cigarettes, uncomplicated: Secondary | ICD-10-CM | POA: Diagnosis not present

## 2021-03-25 DIAGNOSIS — R932 Abnormal findings on diagnostic imaging of liver and biliary tract: Secondary | ICD-10-CM

## 2021-03-25 DIAGNOSIS — E1142 Type 2 diabetes mellitus with diabetic polyneuropathy: Secondary | ICD-10-CM | POA: Diagnosis not present

## 2021-03-25 DIAGNOSIS — I10 Essential (primary) hypertension: Secondary | ICD-10-CM | POA: Insufficient documentation

## 2021-03-25 DIAGNOSIS — E785 Hyperlipidemia, unspecified: Secondary | ICD-10-CM | POA: Insufficient documentation

## 2021-03-25 DIAGNOSIS — R590 Localized enlarged lymph nodes: Secondary | ICD-10-CM | POA: Diagnosis not present

## 2021-03-25 DIAGNOSIS — E279 Disorder of adrenal gland, unspecified: Secondary | ICD-10-CM | POA: Insufficient documentation

## 2021-03-25 DIAGNOSIS — R69 Illness, unspecified: Secondary | ICD-10-CM | POA: Diagnosis not present

## 2021-03-25 DIAGNOSIS — R160 Hepatomegaly, not elsewhere classified: Secondary | ICD-10-CM

## 2021-03-25 DIAGNOSIS — Z79899 Other long term (current) drug therapy: Secondary | ICD-10-CM | POA: Insufficient documentation

## 2021-03-25 DIAGNOSIS — Z7982 Long term (current) use of aspirin: Secondary | ICD-10-CM | POA: Insufficient documentation

## 2021-03-25 LAB — CA 19-9 (SERIAL): CA 19-9: <2 U/mL (ref 0–35)

## 2021-03-25 LAB — AFP TUMOR MARKER: AFP, Serum, Tumor Marker: 4.9 ng/mL (ref 0.0–9.2)

## 2021-03-25 LAB — CEA: CEA: 5.3 ng/mL — ABNORMAL HIGH (ref 0.0–4.7)

## 2021-03-28 ENCOUNTER — Telehealth: Payer: Self-pay | Admitting: *Deleted

## 2021-03-28 NOTE — Telephone Encounter (Signed)
Called back to pt today and she answered the phone but not at home and wanted me to go over info again. I did talk to her again and went over all instructions and told her that she has appt with dr Janese Banks 5/3 at 11:15. She is aware of all of this.

## 2021-03-28 NOTE — Telephone Encounter (Signed)
Called patient about her biopsy appointment and I got her voicemail.  I have left a message that her biopsy date is on April 27 with an arrival to the medical mall here at [place] at 930.  Must have a driver to bring her and take her home because she will get medicine that will make her sleepy.  Nothing to eat or drink 8 hours prior to this time.  There will be a person from interventional radiology to call a day or 2 before the actual biopsy so that she knows process.  He will be there usually 2 to 4 hours .  I have left my direct telephone number that she can call me if she has any questions and I also will reach out with Dr. Janese Banks of when we need to see her back.  When I know that

## 2021-03-29 NOTE — Progress Notes (Signed)
Hematology/Oncology Consult note [facility] Telephone:([phone] [phone]  Patient Care Team: Steele Sizer, MD as PCP - General (Family Medicine) Neldon Labella, RN as Case Manager   Name of the patient: Janice Short  [id number]  1961-04-04    Reason for referral-liver mass   Referring physician-Dr. Corky Downs  Date of visit: 03/29/21   History of presenting illness- Patient is a 60 year old African-American female with a past medical history significant for hypertension hyperlipidemia and type 2 diabetes who presented to the ER with symptoms of cramping abdominal pain and nausea.  This was followed by right upper quadrant ultrasound which showed multiple echogenic masses in the liver with nodular contour of the liver possibly suggestive of cirrhosis.  Several nodular masses in the vicinity of the pancreatic head and porta hepatic lymph nodes.  This was followed by an MRI abdomen and MRCP.  It showed right hepatic lobe masslike area measuring 4.9 x 2.9 cm.  Diffusion abnormality in the left hepatic lobe with mild left-sided biliary ductal dilatation and another area of abnormality measuring 1.4 x 1 cm.  Bulky celiac adenopathy 3 x 2.1 cm tracking into gastrohepatic recess.  Suspected portal venous invasion with convex protrusion in the right portal vein.  Left adrenal lesion measuring 2.9 x 1.5 cm.  Findings concerning for cholangiocarcinoma or biphenotypic cholangiole hepatocellular neoplasm.  Patient referred for further management.  She reports feeling better today and denies any nausea or abdominal pain.  She is independent of her ADLs.  Reports appetite and weight have remained normal.   ECOG PS- 1  Pain scale- 0   Review of systems- Review of Systems  Constitutional: Negative for chills, fever, malaise/fatigue and weight loss.  HENT: Negative for congestion, ear discharge and nosebleeds.   Eyes: Negative for blurred vision.  Respiratory:  Negative for cough, hemoptysis, sputum production, shortness of breath and wheezing.   Cardiovascular: Negative for chest pain, palpitations, orthopnea and claudication.  Gastrointestinal: Negative for abdominal pain, blood in stool, constipation, diarrhea, heartburn, melena, nausea and vomiting.  Genitourinary: Negative for dysuria, flank pain, frequency, hematuria and urgency.  Musculoskeletal: Negative for back pain, joint pain and myalgias.  Skin: Negative for rash.  Neurological: Negative for dizziness, tingling, focal weakness, seizures, weakness and headaches.  Endo/Heme/Allergies: Does not bruise/bleed easily.  Psychiatric/Behavioral: Negative for depression and suicidal ideas. The patient does not have insomnia.     Allergies  Allergen Reactions  . Other Shortness Of Breath    Cat dander and grass pollen  . Bydureon [Exenatide] Other (See Comments)    Pain with injection not allergy    Patient Active Problem List   Diagnosis Date Noted  . Mild episode of recurrent major depressive disorder ([place]) 02/08/2019  . Dyslipidemia associated with type 2 diabetes mellitus ([place]) 12/17/2017  . Noncompliance with medication regimen 12/17/2017  . Spinal stenosis 10/15/2016  . Diabetic polyneuropathy associated with type 2 diabetes mellitus ([place]) 07/14/2016  . Chronic midline low back pain with sciatica 06/22/2016  . Primary osteoarthritis of both knees 06/22/2016  . Carpal tunnel syndrome 07/01/2015  . Cervical radiculitis 07/01/2015  . Corn or callus 07/01/2015  . Impingement syndrome of shoulder 07/01/2015  . Microalbuminuria 07/01/2015  . Compulsive tobacco user syndrome 07/01/2015  . Tenosynovitis of wrist 07/01/2015  . Benign hypertension 11/05/2009  . Uncontrolled type 2 diabetes mellitus with microalbuminuria ([place]) 11/05/2009  . Athlete's foot 08/05/2009  . Combined fat and carbohydrate induced hyperlipemia 01/08/2009  . Vitamin D deficiency 01/08/2009  .  Asthma, mild  intermittent 12/28/2008  . Allergic rhinitis 12/28/2008  . Lumbosacral neuritis 08/11/2007     Past Medical History:  Diagnosis Date  . Allergy   . Asthma   . Carpal tunnel syndrome on left   . Cervical radiculitis   . Chronic osteoarthritis   . Corns and callosity   . Depression   . Dermatophytosis of foot   . Diabetes mellitus without complication ([place])   . Gout   . Hyperlipidemia   . Hypertension   . Impingement syndrome of left shoulder   . Lumbosacral neuritis   . Microalbuminuria   . Obesity   . Supraventricular tachycardia ([place]) 07/01/2015  . SVT (supraventricular tachycardia) ([place])   . Tenosynovitis of wrist   . Vitamin D deficiency      Past Surgical History:  Procedure Laterality Date  . ABDOMINAL HYSTERECTOMY      Social History   Socioeconomic History  . Marital status: Single    Spouse name: Not on file  . Number of children: 2  . Years of education: Not on file  . Highest education level: Not on file  Occupational History  . Occupation: disable     Comment: from chronic back pain, 2019  Tobacco Use  . Smoking status: Current Some Day Smoker    Packs/day: 0.25    Types: Cigarettes  . Smokeless tobacco: Never Used  . Tobacco comment: 2 per day 01/30/21  Vaping Use  . Vaping Use: Never used  Substance and Sexual Activity  . Alcohol use: No    Alcohol/week: 0.0 standard drinks  . Drug use: No  . Sexual activity: Yes  Other Topics Concern  . Not on file  Social History Narrative   She is on disability for chronic back pain. Pt's son lives with her   Social Determinants of Health   Financial Resource Strain: Medium Risk  . Difficulty of Paying Living Expenses: Somewhat hard  Food Insecurity: No Food Insecurity  . Worried About Charity fundraiser in the Last Year: Never true  . Ran Out of Food in the Last Year: Never true  Transportation Needs: No Transportation Needs  . Lack of Transportation (Medical): No  . Lack of Transportation  (Non-Medical): No  Physical Activity: Inactive  . Days of Exercise per Week: 0 days  . Minutes of Exercise per Session: 0 min  Stress: No Stress Concern Present  . Feeling of Stress : Not at all  Social Connections: Moderately Isolated  . Frequency of Communication with Friends and Family: More than three times a week  . Frequency of Social Gatherings with Friends and Family: Three times a week  . Attends Religious Services: More than 4 times per year  . Active Member of Clubs or Organizations: No  . Attends Archivist Meetings: Never  . Marital Status: Never married  Intimate Partner Violence: Not At Risk  . Fear of Current or Ex-Partner: No  . Emotionally Abused: No  . Physically Abused: No  . Sexually Abused: No     Family History  Problem Relation Age of Onset  . Heart attack Mother   . CAD Mother   . Kidney disease Father      Current Outpatient Medications:  .  acetaminophen (TYLENOL) 500 MG tablet, Take 1 tablet by mouth at bedtime as needed., Disp: , Rfl:  .  albuterol (VENTOLIN HFA) 108 (90 Base) MCG/ACT inhaler, INHALE 2 PUFFS BY MOUTH EVERY 6 HOURS AS NEEDED FOR WHEEZING AND  FOR SHORTNESS OF BREATH, Disp: 18 g, Rfl: 1 .  aspirin 81 MG tablet, Take 1 tablet by mouth daily., Disp: , Rfl:  .  atorvastatin (LIPITOR) 40 MG tablet, Take 1 tablet (40 mg total) by mouth at bedtime., Disp: 90 tablet, Rfl: 1 .  BAYER MICROLET LANCETS lancets, Use as instructed, Disp: 100 each, Rfl: 12 .  Empagliflozin-metFORMIN HCl ER (SYNJARDY XR) 12.04-999 MG TB24, Take 2 tablets by mouth daily. New dose, Disp: 180 tablet, Rfl: 1 .  fluticasone (FLONASE) 50 MCG/ACT nasal spray, USE 2 SPRAY(S) IN EACH NOSTRIL AT BEDTIME, Disp: 16 g, Rfl: 0 .  Fluticasone-Umeclidin-Vilant (TRELEGY ELLIPTA) 100-62.5-25 MCG/INH AEPB, Take 1 puff by mouth daily., Disp: 180 each, Rfl: 1 .  gabapentin (NEURONTIN) 300 MG capsule, Take 2 capsules (600 mg total) by mouth 2 (two) times daily., Disp: 360  capsule, Rfl: 1 .  glucose blood (BAYER CONTOUR TEST) test strip, Use as instructed, Disp: 100 each, Rfl: 2 .  Insulin Degludec-Liraglutide (XULTOPHY) 100-3.6 UNIT-MG/ML SOPN, Inject 50 Units into the skin daily., Disp: 15 mL, Rfl: 1 .  Insulin Pen Needle 31G X 8 MM MISC, 1 each by Does not apply route daily., Disp: 100 each, Rfl: 1 .  losartan-hydrochlorothiazide (HYZAAR) 50-12.5 MG tablet, Take 1 tablet by mouth daily., Disp: 90 tablet, Rfl: 1 .  ondansetron (ZOFRAN ODT) 4 MG disintegrating tablet, Take 1 tablet (4 mg total) by mouth every 8 (eight) hours as needed., Disp: 20 tablet, Rfl: 0 .  Vitamin D, Ergocalciferol, (DRISDOL) 1.25 MG (50000 UNIT) CAPS capsule, Take 1 capsule (50,000 Units total) by mouth every 7 (seven) days., Disp: 12 capsule, Rfl: 1   Physical exam:  Vitals:   03/25/21 1534  BP: 103/68  Pulse: (!) 113  Resp: 20  Temp: (!) 96.8 F (36 C)  SpO2: 100%  Weight: 157 lb 6.4 oz (71.4 kg)   Physical Exam Constitutional:      General: She is not in acute distress. Cardiovascular:     Rate and Rhythm: Normal rate and regular rhythm.     Heart sounds: Normal heart sounds.  Pulmonary:     Effort: Pulmonary effort is normal.     Breath sounds: Normal breath sounds.  Abdominal:     General: Bowel sounds are normal.     Palpations: Abdomen is soft.  Skin:    General: Skin is warm and dry.  Neurological:     Mental Status: She is alert and oriented to person, place, and time.        CMP Latest Ref Rng & Units 03/24/2021  Glucose 70 - 99 mg/dL 225(H)  BUN 8 - 23 mg/dL 19  Creatinine 0.44 - 1.00 mg/dL 1.11(H)  Sodium 135 - 145 mmol/L 136  Potassium 3.5 - 5.1 mmol/L 4.4  Chloride 98 - 111 mmol/L 104  CO2 22 - 32 mmol/L 20(L)  Calcium 8.9 - 10.3 mg/dL 9.4  Total Protein 6.5 - 8.1 g/dL 8.8(H)  Total Bilirubin 0.3 - 1.2 mg/dL 2.0(H)  Alkaline Phos 38 - 126 U/L 619(H)  AST 15 - 41 U/L 35  ALT 0 - 44 U/L 41   CBC Latest Ref Rng & Units 03/24/2021  WBC 4.0 -  10.5 K/uL 11.9(H)  Hemoglobin 12.0 - 15.0 g/dL 13.7  Hematocrit 36.0 - 46.0 % 40.0  Platelets 150 - 400 K/uL 313    No images are attached to the encounter.  DG Chest 2 View  Result Date: 03/24/2021 CLINICAL DATA:  Left shoulder pain,  upper abdominal pain EXAM: CHEST - 2 VIEW COMPARISON:  None. FINDINGS: Heart and mediastinal contours are within normal limits. No focal opacities or effusions. No acute bony abnormality. IMPRESSION: No active cardiopulmonary disease. Electronically Signed   By: Rolm Baptise M.D.   On: 03/24/2021 03:50   MR 3D Recon At Scanner  Result Date: 03/24/2021 CLINICAL DATA:  History of chronic liver disease and epigastric pain, presenting to the emergency department with nausea and upper abdominal pain by report. EXAM: MRI ABDOMEN WITHOUT AND WITH CONTRAST (INCLUDING MRCP) TECHNIQUE: Multiplanar multisequence MR imaging of the abdomen was performed both before and after the administration of intravenous contrast. Heavily T2-weighted images of the biliary and pancreatic ducts were obtained, and three-dimensional MRCP images were rendered by post processing. CONTRAST:  13mL GADAVIST GADOBUTROL 1 MMOL/ML IV SOLN COMPARISON:  Ultrasound of the abdomen of March 24, 2021 FINDINGS: Lower chest: Incidental imaging of the lung bases is unremarkable, no effusion or consolidative changes. Hepatobiliary: Heterogeneous appearance of the RIGHT hepatic lobe with masslike area in the central RIGHT hemi liver obstructing RIGHT biliary ducts. Area measuring approximately 4.9 x 2.9 cm. Scattered areas of diffusion abnormality in the LEFT hepatic lobe with mild LEFT-sided biliary duct distension though not to the same extent. Medial segment LEFT hepatic lobe with additional area of signal abnormality seen on image 35 of series 15 measuring 1.4 x 1.0 cm. Bulky celiac lymphadenopathy, nodal disease measuring up to 3.0 x 2.1 cm on image 34 of series 16 and tracking into the gastrohepatic recess.  Scattered areas of T2 signal abnormality are suggested in the LEFT hepatic lobe lateral section which is hypertrophied while the RIGHT lobe is atrophied. 11 mm hepatic subsegment III lesion on image 28 of series 6 and a second lesion approximately 1 cm in the lateral segment of the LEFT hepatic lobe on image 25 of series 6. Suspected portal venous invasion with convex protrusion into the RIGHT portal vein on image 30 of series 16 Gallbladder is distended without pericholecystic stranding. Pancreas:  No focal, suspicious pancreatic lesion. Spleen:  Normal size and contour. Adrenals/Urinary Tract: LEFT adrenal lesion measuring 2.9 x 1.5 cm mildly heterogeneous. In and out of phase are limited by motion related artifact not allowing for definitive characterization there is a question of signal loss though motion artifact passes through the area of the LEFT adrenal. RIGHT adrenal gland is normal. The kidneys enhance symmetrically with small low signal foci likely small cysts. Stomach/Bowel: No acute gastrointestinal process to the extent evaluated. Vascular/Lymphatic: Patent abdominal vasculature. Gastrohepatic and celiac adenopathy. Other:  No ascites. Musculoskeletal: No suspicious bone lesions identified. IMPRESSION: 1. RIGHT hepatic mass with biliary duct distension and suspected portal venous invasion and or occlusion. Findings are suspicious for cholangiocarcinoma or biphenotypic/cholangio hepatocellular neoplasm as biliary duct distension would be atypical for pure HCC. In a patient with known risk factors for hepatocellular carcinoma this would be classified as LI-RADS category M given infiltrative appearance and ductal dilation. Correlate with any risk factors for HCC. 2. Gastrohepatic adenopathy and suspected LEFT lobe lesions. Endoscopic sampling may be helpful for further assessment to confirm presence of extrahepatic disease and obtain diagnosis. 3. LEFT adrenal lesion raising the question of additional  site of extrahepatic involvement, limited assessment due to motion related artifact. Further evaluation with adrenal protocol CT could be considered during further staging as warranted. 4. Gallbladder is distended without pericholecystic stranding. Electronically Signed   By: Zetta Bills M.D.   On: 03/24/2021 09:07   MR  ABDOMEN MRCP W WO CONTAST  Result Date: 03/24/2021 CLINICAL DATA:  History of chronic liver disease and epigastric pain, presenting to the emergency department with nausea and upper abdominal pain by report. EXAM: MRI ABDOMEN WITHOUT AND WITH CONTRAST (INCLUDING MRCP) TECHNIQUE: Multiplanar multisequence MR imaging of the abdomen was performed both before and after the administration of intravenous contrast. Heavily T2-weighted images of the biliary and pancreatic ducts were obtained, and three-dimensional MRCP images were rendered by post processing. CONTRAST:  43mL GADAVIST GADOBUTROL 1 MMOL/ML IV SOLN COMPARISON:  Ultrasound of the abdomen of March 24, 2021 FINDINGS: Lower chest: Incidental imaging of the lung bases is unremarkable, no effusion or consolidative changes. Hepatobiliary: Heterogeneous appearance of the RIGHT hepatic lobe with masslike area in the central RIGHT hemi liver obstructing RIGHT biliary ducts. Area measuring approximately 4.9 x 2.9 cm. Scattered areas of diffusion abnormality in the LEFT hepatic lobe with mild LEFT-sided biliary duct distension though not to the same extent. Medial segment LEFT hepatic lobe with additional area of signal abnormality seen on image 35 of series 15 measuring 1.4 x 1.0 cm. Bulky celiac lymphadenopathy, nodal disease measuring up to 3.0 x 2.1 cm on image 34 of series 16 and tracking into the gastrohepatic recess. Scattered areas of T2 signal abnormality are suggested in the LEFT hepatic lobe lateral section which is hypertrophied while the RIGHT lobe is atrophied. 11 mm hepatic subsegment III lesion on image 28 of series 6 and a second  lesion approximately 1 cm in the lateral segment of the LEFT hepatic lobe on image 25 of series 6. Suspected portal venous invasion with convex protrusion into the RIGHT portal vein on image 30 of series 16 Gallbladder is distended without pericholecystic stranding. Pancreas:  No focal, suspicious pancreatic lesion. Spleen:  Normal size and contour. Adrenals/Urinary Tract: LEFT adrenal lesion measuring 2.9 x 1.5 cm mildly heterogeneous. In and out of phase are limited by motion related artifact not allowing for definitive characterization there is a question of signal loss though motion artifact passes through the area of the LEFT adrenal. RIGHT adrenal gland is normal. The kidneys enhance symmetrically with small low signal foci likely small cysts. Stomach/Bowel: No acute gastrointestinal process to the extent evaluated. Vascular/Lymphatic: Patent abdominal vasculature. Gastrohepatic and celiac adenopathy. Other:  No ascites. Musculoskeletal: No suspicious bone lesions identified. IMPRESSION: 1. RIGHT hepatic mass with biliary duct distension and suspected portal venous invasion and or occlusion. Findings are suspicious for cholangiocarcinoma or biphenotypic/cholangio hepatocellular neoplasm as biliary duct distension would be atypical for pure HCC. In a patient with known risk factors for hepatocellular carcinoma this would be classified as LI-RADS category M given infiltrative appearance and ductal dilation. Correlate with any risk factors for HCC. 2. Gastrohepatic adenopathy and suspected LEFT lobe lesions. Endoscopic sampling may be helpful for further assessment to confirm presence of extrahepatic disease and obtain diagnosis. 3. LEFT adrenal lesion raising the question of additional site of extrahepatic involvement, limited assessment due to motion related artifact. Further evaluation with adrenal protocol CT could be considered during further staging as warranted. 4. Gallbladder is distended without  pericholecystic stranding. Electronically Signed   By: Zetta Bills M.D.   On: 03/24/2021 09:07   US ABDOMEN LIMITED RUQ (LIVER/GB)  Addendum Date: 03/24/2021   ADDENDUM REPORT: 03/24/2021 06:15 ADDENDUM: These results were called by telephone at the time of interpretation on 03/24/2021 at 6:15 am to provider JADE SUNG , who verbally acknowledged these results. Electronically Signed   By: Elwin Sleight.D.  On: 03/24/2021 06:15   Result Date: 03/24/2021 CLINICAL DATA:  Nausea and abdominal pain for 2 days EXAM: ULTRASOUND ABDOMEN LIMITED RIGHT UPPER QUADRANT COMPARISON:  None. FINDINGS: Gallbladder: Mild gallbladder distension. Some dependently layering shadowing gallstones and sludge are present. No significant gallbladder wall thickening. Sonographic Percell Miller sign is reportedly negative. Common bile duct: Diameter: 6.5 mm, borderline dilated. Liver: Diffusely inhomogeneous appearance of the liver with coarsened hepatic echogenicity and a nodular liver surface contour. Enlarged left lobe and caudate are noted. Multiple heterogeneous though predominantly hypoechoic masses are seen throughout the right lobe liver, largest measuring up to 5.3 cm, second largest up to 3.3 cm. No visible intrahepatic biliary ductal dilatation. Portal vein is patent on color Doppler imaging with normal direction of blood flow towards the liver. Other: Several small nodular masses are seen in the vicinity of the pancreatic head, possibly porta hepatic lymph nodes though difficult to fully delineate given adjacent bowel gas. IMPRESSION: 1. Diffusely heterogeneous appearing liver with mildly nodular surface contour, could reflect a cirrhotic configuration or other intrinsic liver disease. 2. Multiple heterogeneous though predominantly hypoechoic masses in the right lobe liver, largest measuring up to 5.3 cm. Findings are highly suspicious for multifocal hepatocellular carcinoma versus metastatic disease. Consider further evaluation  with dedicated liver MR. 3. Several small nodular masses in the vicinity of the pancreatic head and possibly porta hepatic lymph nodes. Could reflect adenopathy or other mass lesion. 4. Mild gallbladder distension with some dependently layering gallstones and sludge. No significant gallbladder wall thickening or sonographic Murphy sign to suggest acute cholecystitis. 5. Borderline dilatation of the common bile duct without visible intrahepatic biliary ductal dilatation. Correlate with liver serologies and consider addition of MRCP imaging where clinically appropriate. Electronically Signed: By: Lovena Le M.D. On: 03/24/2021 06:09    Assessment and plan- Patient is a 60 y.o. female referred for celiac adenopathy and multiple liver lesions concerning for HCC/cholangiocarcinoma   MRI shows evidence of dominant right hepatic mass concerning for cholangiocarcinoma versus by typical cholangiopathy cellular carcinoma.  Patient also noted to have celiac plexus adenopathy and left adrenal lesion as well as concerning findings of portal venous invasion all suggestive of malignancy.  Patient will need tissue diagnosis and I will arrange for CT-guided liver biopsy.  Interestingly her AFP CA 19-9 are all normal.  CEA mildly elevated at 5.3.  Based on the results of the biopsy I will consider getting a PET CT scan to complete her staging work-up.   Patient comprehends my plan well  Thank you for this kind referral and the opportunity to participate in the care of this patient   Visit Diagnosis 1. Adenopathy   2. Liver mass     Dr. Randa Evens, MD, MPH [facility] at [facility] [phone] 03/29/2021  11:58 AM

## 2021-03-31 NOTE — Progress Notes (Signed)
Patient on schedule for Liver biopsy 04/02/2021, called and spoke with patient on phone with pre procedure instructions given. Made aware to be hre @ 0930, nPO after Mn prior to procedure, and hold am metformin. To have driver post procedure/discharge. Stated understanding.

## 2021-04-01 ENCOUNTER — Other Ambulatory Visit: Payer: Self-pay | Admitting: Student

## 2021-04-02 ENCOUNTER — Ambulatory Visit
Admission: RE | Admit: 2021-04-02 | Discharge: 2021-04-02 | Disposition: A | Discharge status: Home / Self Care | Payer: Medicare HMO | Source: Ambulatory Visit | Attending: Oncology | Admitting: Oncology

## 2021-04-02 ENCOUNTER — Other Ambulatory Visit: Payer: Self-pay

## 2021-04-02 DIAGNOSIS — C229 Malignant neoplasm of liver, not specified as primary or secondary: Secondary | ICD-10-CM | POA: Diagnosis not present

## 2021-04-02 DIAGNOSIS — R160 Hepatomegaly, not elsewhere classified: Secondary | ICD-10-CM | POA: Diagnosis not present

## 2021-04-02 DIAGNOSIS — R970 Elevated carcinoembryonic antigen [CEA]: Secondary | ICD-10-CM | POA: Diagnosis not present

## 2021-04-02 DIAGNOSIS — K769 Liver disease, unspecified: Secondary | ICD-10-CM | POA: Diagnosis not present

## 2021-04-02 DIAGNOSIS — Z7689 Persons encountering health services in other specified circumstances: Secondary | ICD-10-CM | POA: Diagnosis not present

## 2021-04-02 DIAGNOSIS — R932 Abnormal findings on diagnostic imaging of liver and biliary tract: Secondary | ICD-10-CM

## 2021-04-02 LAB — CBC
HCT: 40.3 % (ref 36.0–46.0)
Hemoglobin: 13.7 g/dL (ref 12.0–15.0)
MCH: 31.4 pg (ref 26.0–34.0)
MCHC: 34 g/dL (ref 30.0–36.0)
MCV: 92.2 fL (ref 80.0–100.0)
Platelets: 322 10*3/uL (ref 150–400)
RBC: 4.37 MIL/uL (ref 3.87–5.11)
RDW: 15.6 % — ABNORMAL HIGH (ref 11.5–15.5)
WBC: 10.4 10*3/uL (ref 4.0–10.5)
nRBC: 0 % (ref 0.0–0.2)

## 2021-04-02 LAB — PROTIME-INR
INR: 1 (ref 0.8–1.2)
Prothrombin Time: 13.3 seconds (ref 11.4–15.2)

## 2021-04-02 LAB — GLUCOSE, CAPILLARY: Glucose-Capillary: 80 mg/dL (ref 70–99)

## 2021-04-02 MED ORDER — FENTANYL CITRATE (PF) 100 MCG/2ML IJ SOLN
INTRAMUSCULAR | Status: AC
  Filled 2021-04-02: qty 2

## 2021-04-02 MED ORDER — MIDAZOLAM HCL 5 MG/5ML IJ SOLN
INTRAMUSCULAR | Status: AC
  Filled 2021-04-02: qty 5

## 2021-04-02 MED ORDER — MIDAZOLAM HCL 2 MG/2ML IJ SOLN
INTRAMUSCULAR | Status: AC
  Filled 2021-04-02: qty 2

## 2021-04-02 MED ORDER — FENTANYL CITRATE (PF) 100 MCG/2ML IJ SOLN
INTRAMUSCULAR | Status: AC | PRN
  Administered 2021-04-02 (×3): 50 ug via INTRAVENOUS

## 2021-04-02 MED ORDER — SODIUM CHLORIDE 0.9 % IV SOLN: INTRAVENOUS | Status: DC

## 2021-04-02 MED ORDER — MIDAZOLAM HCL 2 MG/2ML IJ SOLN
INTRAMUSCULAR | Status: AC | PRN
  Administered 2021-04-02 (×3): 1 mg via INTRAVENOUS

## 2021-04-02 NOTE — Procedures (Signed)
Interventional Radiology Procedure Note  Procedure: Livers lesion biopsy  Indication: Multiple hepatic lesions  Findings: Please refer to procedural dictation for full description.  Complications: None  EBL: < 10 mL  Miachel Roux, MD [phone]

## 2021-04-02 NOTE — Consult Note (Signed)
Chief Complaint: Liver mass  Referring Physician(s): Rao,Archana C  Patient Status: [facility] - Out-pt  History of Present Illness: Janice Short is a 60 y.o. female with past medical history of hypertension, hyperlipidemia, Type 2 diabetes was found to have multiple hepatic masses while undergoing workup for abdominal pain and nausea.  She presents to IR today for Ultrasound guided biopsy.  She currently denies cough, SOB, chest or abdominal pain.  Past Medical History:  Diagnosis Date  . Allergy   . Asthma   . Carpal tunnel syndrome on left   . Cervical radiculitis   . Chronic osteoarthritis   . Corns and callosity   . Depression   . Dermatophytosis of foot   . Diabetes mellitus without complication ([place])   . Gout   . Hyperlipidemia   . Hypertension   . Impingement syndrome of left shoulder   . Lumbosacral neuritis   . Microalbuminuria   . Obesity   . Supraventricular tachycardia ([place]) 07/01/2015  . SVT (supraventricular tachycardia) ([place])   . Tenosynovitis of wrist   . Vitamin D deficiency     Past Surgical History:  Procedure Laterality Date  . ABDOMINAL HYSTERECTOMY      Allergies: Other and Bydureon [exenatide]  Medications: Prior to Admission medications   Medication Sig Start Date End Date Taking? Authorizing Provider  acetaminophen (TYLENOL) 500 MG tablet Take 1 tablet by mouth at bedtime as needed.   Yes [provider]  albuterol (VENTOLIN HFA) 108 (90 Base) MCG/ACT inhaler INHALE 2 PUFFS BY MOUTH EVERY 6 HOURS AS NEEDED FOR WHEEZING AND FOR SHORTNESS OF BREATH 02/15/20  Yes Sowles, Drue Stager, MD  aspirin 81 MG tablet Take 1 tablet by mouth daily. 11/05/09  Yes [provider]  atorvastatin (LIPITOR) 40 MG tablet Take 1 tablet (40 mg total) by mouth at bedtime. 12/26/20  Yes Sowles, Drue Stager, MD  Empagliflozin-metFORMIN HCl ER (SYNJARDY XR) 12.04-999 MG TB24 Take 2 tablets by mouth daily. New dose 12/26/20  Yes Sowles, Drue Stager, MD   fluticasone [facility]) 50 MCG/ACT nasal spray USE 2 SPRAY(S) IN EACH NOSTRIL AT BEDTIME 02/19/21  Yes Sowles, Drue Stager, MD  Fluticasone-Umeclidin-Vilant (TRELEGY ELLIPTA) 100-62.5-25 MCG/INH AEPB Take 1 puff by mouth daily. 12/26/20  Yes Sowles, Drue Stager, MD  gabapentin (NEURONTIN) 300 MG capsule Take 2 capsules (600 mg total) by mouth 2 (two) times daily. 12/26/20  Yes Sowles, Drue Stager, MD  glucose blood (BAYER CONTOUR TEST) test strip Use as instructed 02/08/19  Yes Hubbard Hartshorn, FNP  Insulin Degludec-Liraglutide (XULTOPHY) 100-3.6 UNIT-MG/ML SOPN Inject 50 Units into the skin daily. 12/26/20  Yes Sowles, Drue Stager, MD  losartan-hydrochlorothiazide (HYZAAR) 50-12.5 MG tablet Take 1 tablet by mouth daily. 12/26/20  Yes Sowles, Drue Stager, MD  ondansetron (ZOFRAN ODT) 4 MG disintegrating tablet Take 1 tablet (4 mg total) by mouth every 8 (eight) hours as needed. 03/24/21  Yes Lavonia Drafts, MD  Vitamin D, Ergocalciferol, (DRISDOL) 1.25 MG (50000 UNIT) CAPS capsule Take 1 capsule (50,000 Units total) by mouth every 7 (seven) days. 12/26/20  Yes Steele Sizer, MD  BAYER MICROLET LANCETS lancets Use as instructed 02/08/19   Hubbard Hartshorn, FNP  Insulin Pen Needle 31G X 8 MM MISC 1 each by Does not apply route daily. 12/26/20   Steele Sizer, MD     Family History  Problem Relation Age of Onset  . Heart attack Mother   . CAD Mother   . Kidney disease Father     Social History   Socioeconomic History  .  Marital status: Single    Spouse name: Not on file  . Number of children: 2  . Years of education: Not on file  . Highest education level: Not on file  Occupational History  . Occupation: disable     Comment: from chronic back pain, 2019  Tobacco Use  . Smoking status: Current Some Day Smoker    Packs/day: 0.25    Types: Cigarettes  . Smokeless tobacco: Never Used  . Tobacco comment: 2 per day 01/30/21  Vaping Use  . Vaping Use: Never used  Substance and Sexual Activity  . Alcohol use: No     Alcohol/week: 0.0 standard drinks  . Drug use: No  . Sexual activity: Yes  Other Topics Concern  . Not on file  Social History Narrative   She is on disability for chronic back pain. Pt's son lives with her   Social Determinants of Health   Financial Resource Strain: Medium Risk  . Difficulty of Paying Living Expenses: Somewhat hard  Food Insecurity: No Food Insecurity  . Worried About Charity fundraiser in the Last Year: Never true  . Ran Out of Food in the Last Year: Never true  Transportation Needs: No Transportation Needs  . Lack of Transportation (Medical): No  . Lack of Transportation (Non-Medical): No  Physical Activity: Inactive  . Days of Exercise per Week: 0 days  . Minutes of Exercise per Session: 0 min  Stress: No Stress Concern Present  . Feeling of Stress : Not at all  Social Connections: Moderately Isolated  . Frequency of Communication with Friends and Family: More than three times a week  . Frequency of Social Gatherings with Friends and Family: Three times a week  . Attends Religious Services: More than 4 times per year  . Active Member of Clubs or Organizations: No  . Attends Archivist Meetings: Never  . Marital Status: Never married    Review of Systems: A 12 point ROS discussed and pertinent positives are indicated in the HPI above.  All other systems are negative.  Review of Systems  Vital Signs: BP 126/81 (BP Location: Left Arm)   Pulse 94   Temp 97.8 F (36.6 C) (Oral)   Resp 12   Ht 5\' 7"  (1.702 m)   Wt 71.2 kg   SpO2 100%   BMI 24.59 kg/m   Physical Exam  Imaging: DG Chest 2 View  Result Date: 03/24/2021 CLINICAL DATA:  Left shoulder pain, upper abdominal pain EXAM: CHEST - 2 VIEW COMPARISON:  None. FINDINGS: Heart and mediastinal contours are within normal limits. No focal opacities or effusions. No acute bony abnormality. IMPRESSION: No active cardiopulmonary disease. Electronically Signed   By: Rolm Baptise M.D.   On:  03/24/2021 03:50   MR 3D Recon At Scanner  Result Date: 03/24/2021 CLINICAL DATA:  History of chronic liver disease and epigastric pain, presenting to the emergency department with nausea and upper abdominal pain by report. EXAM: MRI ABDOMEN WITHOUT AND WITH CONTRAST (INCLUDING MRCP) TECHNIQUE: Multiplanar multisequence MR imaging of the abdomen was performed both before and after the administration of intravenous contrast. Heavily T2-weighted images of the biliary and pancreatic ducts were obtained, and three-dimensional MRCP images were rendered by post processing. CONTRAST:  98mL GADAVIST GADOBUTROL 1 MMOL/ML IV SOLN COMPARISON:  Ultrasound of the abdomen of March 24, 2021 FINDINGS: Lower chest: Incidental imaging of the lung bases is unremarkable, no effusion or consolidative changes. Hepatobiliary: Heterogeneous appearance of the RIGHT hepatic lobe  with masslike area in the central RIGHT hemi liver obstructing RIGHT biliary ducts. Area measuring approximately 4.9 x 2.9 cm. Scattered areas of diffusion abnormality in the LEFT hepatic lobe with mild LEFT-sided biliary duct distension though not to the same extent. Medial segment LEFT hepatic lobe with additional area of signal abnormality seen on image 35 of series 15 measuring 1.4 x 1.0 cm. Bulky celiac lymphadenopathy, nodal disease measuring up to 3.0 x 2.1 cm on image 34 of series 16 and tracking into the gastrohepatic recess. Scattered areas of T2 signal abnormality are suggested in the LEFT hepatic lobe lateral section which is hypertrophied while the RIGHT lobe is atrophied. 11 mm hepatic subsegment III lesion on image 28 of series 6 and a second lesion approximately 1 cm in the lateral segment of the LEFT hepatic lobe on image 25 of series 6. Suspected portal venous invasion with convex protrusion into the RIGHT portal vein on image 30 of series 16 Gallbladder is distended without pericholecystic stranding. Pancreas:  No focal, suspicious pancreatic  lesion. Spleen:  Normal size and contour. Adrenals/Urinary Tract: LEFT adrenal lesion measuring 2.9 x 1.5 cm mildly heterogeneous. In and out of phase are limited by motion related artifact not allowing for definitive characterization there is a question of signal loss though motion artifact passes through the area of the LEFT adrenal. RIGHT adrenal gland is normal. The kidneys enhance symmetrically with small low signal foci likely small cysts. Stomach/Bowel: No acute gastrointestinal process to the extent evaluated. Vascular/Lymphatic: Patent abdominal vasculature. Gastrohepatic and celiac adenopathy. Other:  No ascites. Musculoskeletal: No suspicious bone lesions identified. IMPRESSION: 1. RIGHT hepatic mass with biliary duct distension and suspected portal venous invasion and or occlusion. Findings are suspicious for cholangiocarcinoma or biphenotypic/cholangio hepatocellular neoplasm as biliary duct distension would be atypical for pure HCC. In a patient with known risk factors for hepatocellular carcinoma this would be classified as LI-RADS category M given infiltrative appearance and ductal dilation. Correlate with any risk factors for HCC. 2. Gastrohepatic adenopathy and suspected LEFT lobe lesions. Endoscopic sampling may be helpful for further assessment to confirm presence of extrahepatic disease and obtain diagnosis. 3. LEFT adrenal lesion raising the question of additional site of extrahepatic involvement, limited assessment due to motion related artifact. Further evaluation with adrenal protocol CT could be considered during further staging as warranted. 4. Gallbladder is distended without pericholecystic stranding. Electronically Signed   By: Zetta Bills M.D.   On: 03/24/2021 09:07   MR ABDOMEN MRCP W WO CONTAST  Result Date: 03/24/2021 CLINICAL DATA:  History of chronic liver disease and epigastric pain, presenting to the emergency department with nausea and upper abdominal pain by report.  EXAM: MRI ABDOMEN WITHOUT AND WITH CONTRAST (INCLUDING MRCP) TECHNIQUE: Multiplanar multisequence MR imaging of the abdomen was performed both before and after the administration of intravenous contrast. Heavily T2-weighted images of the biliary and pancreatic ducts were obtained, and three-dimensional MRCP images were rendered by post processing. CONTRAST:  35mL GADAVIST GADOBUTROL 1 MMOL/ML IV SOLN COMPARISON:  Ultrasound of the abdomen of March 24, 2021 FINDINGS: Lower chest: Incidental imaging of the lung bases is unremarkable, no effusion or consolidative changes. Hepatobiliary: Heterogeneous appearance of the RIGHT hepatic lobe with masslike area in the central RIGHT hemi liver obstructing RIGHT biliary ducts. Area measuring approximately 4.9 x 2.9 cm. Scattered areas of diffusion abnormality in the LEFT hepatic lobe with mild LEFT-sided biliary duct distension though not to the same extent. Medial segment LEFT hepatic lobe with additional area  of signal abnormality seen on image 35 of series 15 measuring 1.4 x 1.0 cm. Bulky celiac lymphadenopathy, nodal disease measuring up to 3.0 x 2.1 cm on image 34 of series 16 and tracking into the gastrohepatic recess. Scattered areas of T2 signal abnormality are suggested in the LEFT hepatic lobe lateral section which is hypertrophied while the RIGHT lobe is atrophied. 11 mm hepatic subsegment III lesion on image 28 of series 6 and a second lesion approximately 1 cm in the lateral segment of the LEFT hepatic lobe on image 25 of series 6. Suspected portal venous invasion with convex protrusion into the RIGHT portal vein on image 30 of series 16 Gallbladder is distended without pericholecystic stranding. Pancreas:  No focal, suspicious pancreatic lesion. Spleen:  Normal size and contour. Adrenals/Urinary Tract: LEFT adrenal lesion measuring 2.9 x 1.5 cm mildly heterogeneous. In and out of phase are limited by motion related artifact not allowing for definitive  characterization there is a question of signal loss though motion artifact passes through the area of the LEFT adrenal. RIGHT adrenal gland is normal. The kidneys enhance symmetrically with small low signal foci likely small cysts. Stomach/Bowel: No acute gastrointestinal process to the extent evaluated. Vascular/Lymphatic: Patent abdominal vasculature. Gastrohepatic and celiac adenopathy. Other:  No ascites. Musculoskeletal: No suspicious bone lesions identified. IMPRESSION: 1. RIGHT hepatic mass with biliary duct distension and suspected portal venous invasion and or occlusion. Findings are suspicious for cholangiocarcinoma or biphenotypic/cholangio hepatocellular neoplasm as biliary duct distension would be atypical for pure HCC. In a patient with known risk factors for hepatocellular carcinoma this would be classified as LI-RADS category M given infiltrative appearance and ductal dilation. Correlate with any risk factors for HCC. 2. Gastrohepatic adenopathy and suspected LEFT lobe lesions. Endoscopic sampling may be helpful for further assessment to confirm presence of extrahepatic disease and obtain diagnosis. 3. LEFT adrenal lesion raising the question of additional site of extrahepatic involvement, limited assessment due to motion related artifact. Further evaluation with adrenal protocol CT could be considered during further staging as warranted. 4. Gallbladder is distended without pericholecystic stranding. Electronically Signed   By: Zetta Bills M.D.   On: 03/24/2021 09:07   US ABDOMEN LIMITED RUQ (LIVER/GB)  Addendum Date: 03/24/2021   ADDENDUM REPORT: 03/24/2021 06:15 ADDENDUM: These results were called by telephone at the time of interpretation on 03/24/2021 at 6:15 am to provider JADE SUNG , who verbally acknowledged these results. Electronically Signed   By: Lovena Le M.D.   On: 03/24/2021 06:15   Result Date: 03/24/2021 CLINICAL DATA:  Nausea and abdominal pain for 2 days EXAM: ULTRASOUND  ABDOMEN LIMITED RIGHT UPPER QUADRANT COMPARISON:  None. FINDINGS: Gallbladder: Mild gallbladder distension. Some dependently layering shadowing gallstones and sludge are present. No significant gallbladder wall thickening. Sonographic Percell Miller sign is reportedly negative. Common bile duct: Diameter: 6.5 mm, borderline dilated. Liver: Diffusely inhomogeneous appearance of the liver with coarsened hepatic echogenicity and a nodular liver surface contour. Enlarged left lobe and caudate are noted. Multiple heterogeneous though predominantly hypoechoic masses are seen throughout the right lobe liver, largest measuring up to 5.3 cm, second largest up to 3.3 cm. No visible intrahepatic biliary ductal dilatation. Portal vein is patent on color Doppler imaging with normal direction of blood flow towards the liver. Other: Several small nodular masses are seen in the vicinity of the pancreatic head, possibly porta hepatic lymph nodes though difficult to fully delineate given adjacent bowel gas. IMPRESSION: 1. Diffusely heterogeneous appearing liver with mildly nodular surface contour,  could reflect a cirrhotic configuration or other intrinsic liver disease. 2. Multiple heterogeneous though predominantly hypoechoic masses in the right lobe liver, largest measuring up to 5.3 cm. Findings are highly suspicious for multifocal hepatocellular carcinoma versus metastatic disease. Consider further evaluation with dedicated liver MR. 3. Several small nodular masses in the vicinity of the pancreatic head and possibly porta hepatic lymph nodes. Could reflect adenopathy or other mass lesion. 4. Mild gallbladder distension with some dependently layering gallstones and sludge. No significant gallbladder wall thickening or sonographic Murphy sign to suggest acute cholecystitis. 5. Borderline dilatation of the common bile duct without visible intrahepatic biliary ductal dilatation. Correlate with liver serologies and consider addition of MRCP  imaging where clinically appropriate. Electronically Signed: By: Lovena Le M.D. On: 03/24/2021 06:09    Labs:  CBC: Recent Labs    03/24/21 0325 04/02/21 1023  WBC 11.9* 10.4  HGB 13.7 13.7  HCT 40.0 40.3  PLT 313 322    COAGS: Recent Labs    04/02/21 1023  INR 1.0    BMP: Recent Labs    12/26/20 1625 03/24/21 0325  NA 141 136  K 4.8 4.4  CL 108 104  CO2 27 20*  GLUCOSE 86 225*  BUN 19 19  CALCIUM 9.4 9.4  CREATININE 1.08* 1.11*  GFRNONAA 55* 56*  GFRAA 64  --     LIVER FUNCTION TESTS: Recent Labs    12/26/20 1625 03/24/21 0605  BILITOT 0.7 2.0*  AST 52* 35  ALT 41* 41  ALKPHOS  --  619*  PROT 8.3*  8.1 8.8*  ALBUMIN  --  3.5    TUMOR MARKERS: No results for input(s): AFPTM, CEA, CA199, CHROMGRNA in the last 8760 hours.  Assessment and Plan:  60 year old woman with multiple hepatic masses presents to IR for Ultrasound guided biopsy.  Risks and benefits of liver lesion biopsy was discussed with the patient and/or patient's family including, but not limited to bleeding, infection, damage to adjacent structures or low yield requiring additional tests.  All of the questions were answered and there is agreement to proceed.  Consent signed and in chart.    Thank you for this interesting consult.  I greatly enjoyed meeting MINAL STULLER and look forward to participating in their care.  A copy of this report was sent to the requesting provider on this date.  Electronically Signed: Paula Libra Triva Hueber, MD 04/02/2021, 11:27 AM   I spent a total of 30 Minutes  in face to face in clinical consultation, greater than 50% of which was counseling/coordinating care for liver lesion biopsy.

## 2021-04-02 NOTE — Discharge Instructions (Signed)
Liver Biopsy, Care After These instructions give you information on caring for yourself after your procedure. Your doctor may also give you more specific instructions. Call your doctor if you have any problems or questions after your procedure. What can I expect after the procedure? After the procedure, it is common to have:  Pain and soreness where the biopsy was done.  Bruising around the area where the biopsy was done.  Sleepiness and be tired for a few days. Follow these instructions at home: Medicines  Take over-the-counter and prescription medicines only as told by your doctor.  If you were prescribed an antibiotic medicine, take it as told by your doctor. Do not stop taking the antibiotic even if you start to feel better.  Do not take medicines such as aspirin and ibuprofen. These medicines can thin your blood. Do not take these medicines unless your doctor tells you to take them.  If you are taking prescription pain medicine, take actions to prevent or treat constipation. Your doctor may recommend that you: ? Drink enough fluid to keep your pee (urine) clear or pale yellow. ? Take over-the-counter or prescription medicines. ? Eat foods that are high in fiber, such as fresh fruits and vegetables, whole grains, and beans. ? Limit foods that are high in fat and processed sugars, such as fried and sweet foods. Caring for your cut  Follow instructions from your doctor about how to take care of your cuts from surgery (incisions). Make sure you: ? Wash your hands with soap and water before you change your bandage (dressing). If you cannot use soap and water, use hand sanitizer. ? Change your bandage as told by your doctor. ? Leave stitches (sutures), skin glue, or skin tape (adhesive) strips in place. They may need to stay in place for 2 weeks or longer. If tape strips get loose and curl up, you may trim the loose edges. Do not remove tape strips completely unless your doctor says it is  okay.  Check your cuts every day for signs of infection. Check for: ? Redness, swelling, or more pain. ? Fluid or blood. ? Pus or a bad smell. ? Warmth.  Do not take baths, swim, or use a hot tub until your doctor says it is okay to do so. Activity  Rest at home for 1-2 days or as told by your doctor. ? Avoid sitting for a long time without moving. Get up to take short walks every 1-2 hours.  Return to your normal activities as told by your doctor. Ask what activities are safe for you.  Do not do these things in the first 24 hours: ? Drive. ? Use machinery. ? Take a bath or shower.  Do not lift more than 10 pounds (4.5 kg) or play contact sports for the first 2 weeks.   General instructions  Do not drink alcohol in the first week after the procedure.  Have someone stay with you for at least 24 hours after the procedure.  Get your test results. Ask your doctor or the department that is doing the test: ? When will my results be ready? ? How will I get my results? ? What are my treatment options? ? What other tests do I need? ? What are my next steps?  Keep all follow-up visits as told by your doctor. This is important.   Contact a doctor if:  A cut bleeds and leaves more than just a small spot of blood.  A cut is red,   puffs up (swells), or hurts more than before.  Fluid or something else comes from a cut.  A cut smells bad.  You have a fever or chills. Get help right away if:  You have swelling, bloating, or pain in your belly (abdomen).  You get dizzy or faint.  You have a rash.  You feel sick to your stomach (nauseous) or throw up (vomit).  You have trouble breathing, feel short of breath, or feel faint.  Your chest hurts.  You have problems talking or seeing.  You have trouble with your balance or moving your arms or legs. Summary  After the procedure, it is common to have pain, soreness, bruising, and tiredness.  Your doctor will tell you how to  take care of yourself at home. Change your bandage, take your medicines, and limit your activities as told by your doctor.  Call your doctor if you have symptoms of infection. Get help right away if your belly swells, your cut bleeds a lot, or you have trouble talking or breathing. This information is not intended to replace advice given to you by your health care provider. Make sure you discuss any questions you have with your health care provider. Document Revised: 12/02/2017 Document Reviewed: 12/03/2017 Elsevier Patient Education  2021 [employer]  

## 2021-04-08 ENCOUNTER — Telehealth: Payer: Self-pay

## 2021-04-08 ENCOUNTER — Other Ambulatory Visit: Payer: Self-pay | Admitting: *Deleted

## 2021-04-08 ENCOUNTER — Encounter: Payer: Self-pay | Admitting: Oncology

## 2021-04-08 ENCOUNTER — Inpatient Hospital Stay: Payer: Medicare HMO | Attending: Oncology | Admitting: Oncology

## 2021-04-08 VITALS — BP 107/67 | HR 99 | Temp 96.9°F | Wt 156.5 lb

## 2021-04-08 DIAGNOSIS — Z7982 Long term (current) use of aspirin: Secondary | ICD-10-CM | POA: Insufficient documentation

## 2021-04-08 DIAGNOSIS — R5381 Other malaise: Secondary | ICD-10-CM | POA: Diagnosis not present

## 2021-04-08 DIAGNOSIS — R69 Illness, unspecified: Secondary | ICD-10-CM | POA: Diagnosis not present

## 2021-04-08 DIAGNOSIS — F17210 Nicotine dependence, cigarettes, uncomplicated: Secondary | ICD-10-CM | POA: Diagnosis not present

## 2021-04-08 DIAGNOSIS — Z79899 Other long term (current) drug therapy: Secondary | ICD-10-CM | POA: Diagnosis not present

## 2021-04-08 DIAGNOSIS — C787 Secondary malignant neoplasm of liver and intrahepatic bile duct: Secondary | ICD-10-CM

## 2021-04-08 DIAGNOSIS — Z794 Long term (current) use of insulin: Secondary | ICD-10-CM | POA: Insufficient documentation

## 2021-04-08 DIAGNOSIS — E785 Hyperlipidemia, unspecified: Secondary | ICD-10-CM | POA: Diagnosis not present

## 2021-04-08 DIAGNOSIS — I10 Essential (primary) hypertension: Secondary | ICD-10-CM | POA: Insufficient documentation

## 2021-04-08 DIAGNOSIS — C221 Intrahepatic bile duct carcinoma: Secondary | ICD-10-CM | POA: Diagnosis not present

## 2021-04-08 DIAGNOSIS — Z7189 Other specified counseling: Secondary | ICD-10-CM | POA: Diagnosis not present

## 2021-04-08 DIAGNOSIS — R5383 Other fatigue: Secondary | ICD-10-CM | POA: Diagnosis not present

## 2021-04-08 DIAGNOSIS — K769 Liver disease, unspecified: Secondary | ICD-10-CM

## 2021-04-08 DIAGNOSIS — C269 Malignant neoplasm of ill-defined sites within the digestive system: Secondary | ICD-10-CM

## 2021-04-08 NOTE — Telephone Encounter (Signed)
error 

## 2021-04-09 ENCOUNTER — Telehealth: Payer: Self-pay

## 2021-04-09 NOTE — Telephone Encounter (Signed)
[facility] order started. Janice Short will need to sign ABN before [facility] will start testing.

## 2021-04-09 NOTE — Telephone Encounter (Signed)
LVM for pt to return my call to schedule an office appt asap.

## 2021-04-10 ENCOUNTER — Other Ambulatory Visit: Payer: Self-pay | Admitting: Pathology

## 2021-04-10 LAB — SURGICAL PATHOLOGY

## 2021-04-10 NOTE — Progress Notes (Signed)
Hematology/Oncology Consult note [facility]  [phone] [phone]  Patient Care Team: Steele Sizer, MD as PCP - General (Family Medicine) Neldon Labella, RN as Case Manager   Name of the patient: Janice Short  [id number]  04-24-1961   Date of visit: 04/10/21  Diagnosis-locally advanced intrahepatic cholangiocarcinoma  Chief complaint/ Reason for visit-discuss pathology results and further management  Heme/Onc history: Patient is a 60 year old African-American female with a past medical history significant for hypertension hyperlipidemia and type 2 diabetes who presented to the ER with symptoms of cramping abdominal pain and nausea.  This was followed by right upper quadrant ultrasound which showed multiple echogenic masses in the liver with nodular contour of the liver possibly suggestive of cirrhosis.  Several nodular masses in the vicinity of the pancreatic head and porta hepatic lymph nodes.  This was followed by an MRI abdomen and MRCP.  It showed right hepatic lobe masslike area measuring 4.9 x 2.9 cm.  Diffusion abnormality in the left hepatic lobe with mild left-sided biliary ductal dilatation and another area of abnormality measuring 1.4 x 1 cm.  Bulky celiac adenopathy 3 x 2.1 cm tracking into gastrohepatic recess.  Suspected portal venous invasion with convex protrusion in the right portal vein.  Left adrenal lesion measuring 2.9 x 1.5 cm.  Findings concerning for cholangiocarcinoma or biphenotypic cholangiole hepatocellular neoplasm.    Patient underwent CT-guided liver biopsy Which was consistent with adenocarcinoma poorly differentiated.  Immunohistochemistry showed CK7 positivity, CDX2 negative, Heppar negative, GATA3 negative.  CK19 positive.  Differentials include cholangiocarcinoma, pancreatic, upper GI.  Lung and breast less likely.  However given CK19 positivity most compatible with cholangiocarcinoma.  Interval  history-patient reports doing well presently.  Denies any specific complaints at this time.  Denies any significant abdominal pain nausea or vomiting  ECOG PS- 1 Pain scale- 0   Review of systems- Review of Systems  Constitutional: Positive for malaise/fatigue. Negative for chills, fever and weight loss.  HENT: Negative for congestion, ear discharge and nosebleeds.   Eyes: Negative for blurred vision.  Respiratory: Negative for cough, hemoptysis, sputum production, shortness of breath and wheezing.   Cardiovascular: Negative for chest pain, palpitations, orthopnea and claudication.  Gastrointestinal: Negative for abdominal pain, blood in stool, constipation, diarrhea, heartburn, melena, nausea and vomiting.  Genitourinary: Negative for dysuria, flank pain, frequency, hematuria and urgency.  Musculoskeletal: Negative for back pain, joint pain and myalgias.  Skin: Negative for rash.  Neurological: Negative for dizziness, tingling, focal weakness, seizures, weakness and headaches.  Endo/Heme/Allergies: Does not bruise/bleed easily.  Psychiatric/Behavioral: Negative for depression and suicidal ideas. The patient does not have insomnia.        Allergies  Allergen Reactions  . Other Shortness Of Breath    Cat dander and grass pollen  . Bydureon [Exenatide] Other (See Comments)    Pain with injection not allergy     Past Medical History:  Diagnosis Date  . Allergy   . Asthma   . Carpal tunnel syndrome on left   . Cervical radiculitis   . Chronic osteoarthritis   . Corns and callosity   . Depression   . Dermatophytosis of foot   . Diabetes mellitus without complication ([place])   . Gout   . Hyperlipidemia   . Hypertension   . Impingement syndrome of left shoulder   . Lumbosacral neuritis   . Microalbuminuria   . Obesity   . Supraventricular tachycardia ([place]) 07/01/2015  . SVT (supraventricular tachycardia) ([place])   .  Tenosynovitis of wrist   . Vitamin D deficiency       Past Surgical History:  Procedure Laterality Date  . ABDOMINAL HYSTERECTOMY      Social History   Socioeconomic History  . Marital status: Single    Spouse name: Not on file  . Number of children: 2  . Years of education: Not on file  . Highest education level: Not on file  Occupational History  . Occupation: disable     Comment: from chronic back pain, 2019  Tobacco Use  . Smoking status: Current Some Day Smoker    Packs/day: 0.25    Types: Cigarettes  . Smokeless tobacco: Never Used  . Tobacco comment: 2 per day 01/30/21  Vaping Use  . Vaping Use: Never used  Substance and Sexual Activity  . Alcohol use: No    Alcohol/week: 0.0 standard drinks  . Drug use: No  . Sexual activity: Yes  Other Topics Concern  . Not on file  Social History Narrative   She is on disability for chronic back pain. Pt's son lives with her   Social Determinants of Health   Financial Resource Strain: Medium Risk  . Difficulty of Paying Living Expenses: Somewhat hard  Food Insecurity: No Food Insecurity  . Worried About Charity fundraiser in the Last Year: Never true  . Ran Out of Food in the Last Year: Never true  Transportation Needs: No Transportation Needs  . Lack of Transportation (Medical): No  . Lack of Transportation (Non-Medical): No  Physical Activity: Inactive  . Days of Exercise per Week: 0 days  . Minutes of Exercise per Session: 0 min  Stress: No Stress Concern Present  . Feeling of Stress : Not at all  Social Connections: Moderately Isolated  . Frequency of Communication with Friends and Family: More than three times a week  . Frequency of Social Gatherings with Friends and Family: Three times a week  . Attends Religious Services: More than 4 times per year  . Active Member of Clubs or Organizations: No  . Attends Archivist Meetings: Never  . Marital Status: Never married  Intimate Partner Violence: Not At Risk  . Fear of Current or Ex-Partner: No  .  Emotionally Abused: No  . Physically Abused: No  . Sexually Abused: No    Family History  Problem Relation Age of Onset  . Heart attack Mother   . CAD Mother   . Kidney disease Father      Current Outpatient Medications:  .  acetaminophen (TYLENOL) 500 MG tablet, Take 1 tablet by mouth at bedtime as needed., Disp: , Rfl:  .  albuterol (VENTOLIN HFA) 108 (90 Base) MCG/ACT inhaler, INHALE 2 PUFFS BY MOUTH EVERY 6 HOURS AS NEEDED FOR WHEEZING AND FOR SHORTNESS OF BREATH, Disp: 18 g, Rfl: 1 .  aspirin 81 MG tablet, Take 1 tablet by mouth daily., Disp: , Rfl:  .  atorvastatin (LIPITOR) 40 MG tablet, Take 1 tablet (40 mg total) by mouth at bedtime., Disp: 90 tablet, Rfl: 1 .  BAYER MICROLET LANCETS lancets, Use as instructed, Disp: 100 each, Rfl: 12 .  Empagliflozin-metFORMIN HCl ER (SYNJARDY XR) 12.04-999 MG TB24, Take 2 tablets by mouth daily. New dose, Disp: 180 tablet, Rfl: 1 .  fluticasone (FLONASE) 50 MCG/ACT nasal spray, USE 2 SPRAY(S) IN EACH NOSTRIL AT BEDTIME, Disp: 16 g, Rfl: 0 .  Fluticasone-Umeclidin-Vilant (TRELEGY ELLIPTA) 100-62.5-25 MCG/INH AEPB, Take 1 puff by mouth daily., Disp: 180 each, Rfl:  1 .  gabapentin (NEURONTIN) 300 MG capsule, Take 2 capsules (600 mg total) by mouth 2 (two) times daily., Disp: 360 capsule, Rfl: 1 .  glucose blood (BAYER CONTOUR TEST) test strip, Use as instructed, Disp: 100 each, Rfl: 2 .  Insulin Degludec-Liraglutide (XULTOPHY) 100-3.6 UNIT-MG/ML SOPN, Inject 50 Units into the skin daily., Disp: 15 mL, Rfl: 1 .  Insulin Pen Needle 31G X 8 MM MISC, 1 each by Does not apply route daily., Disp: 100 each, Rfl: 1 .  losartan-hydrochlorothiazide (HYZAAR) 50-12.5 MG tablet, Take 1 tablet by mouth daily., Disp: 90 tablet, Rfl: 1 .  ondansetron (ZOFRAN ODT) 4 MG disintegrating tablet, Take 1 tablet (4 mg total) by mouth every 8 (eight) hours as needed., Disp: 20 tablet, Rfl: 0 .  Vitamin D, Ergocalciferol, (DRISDOL) 1.25 MG (50000 UNIT) CAPS capsule, Take 1  capsule (50,000 Units total) by mouth every 7 (seven) days., Disp: 12 capsule, Rfl: 1  Physical exam:  Vitals:   04/08/21 1117  BP: 107/67  Pulse: 99  Temp: (!) 96.9 F (36.1 C)  TempSrc: Tympanic  SpO2: 99%  Weight: 156 lb 8 oz (71 kg)   Physical Exam   CMP Latest Ref Rng & Units 03/24/2021  Glucose 70 - 99 mg/dL 225(H)  BUN 8 - 23 mg/dL 19  Creatinine 0.44 - 1.00 mg/dL 1.11(H)  Sodium 135 - 145 mmol/L 136  Potassium 3.5 - 5.1 mmol/L 4.4  Chloride 98 - 111 mmol/L 104  CO2 22 - 32 mmol/L 20(L)  Calcium 8.9 - 10.3 mg/dL 9.4  Total Protein 6.5 - 8.1 g/dL 8.8(H)  Total Bilirubin 0.3 - 1.2 mg/dL 2.0(H)  Alkaline Phos 38 - 126 U/L 619(H)  AST 15 - 41 U/L 35  ALT 0 - 44 U/L 41   CBC Latest Ref Rng & Units 04/02/2021  WBC 4.0 - 10.5 K/uL 10.4  Hemoglobin 12.0 - 15.0 g/dL 13.7  Hematocrit 36.0 - 46.0 % 40.3  Platelets 150 - 400 K/uL 322    No images are attached to the encounter.  DG Chest 2 View  Result Date: 03/24/2021 CLINICAL DATA:  Left shoulder pain, upper abdominal pain EXAM: CHEST - 2 VIEW COMPARISON:  None. FINDINGS: Heart and mediastinal contours are within normal limits. No focal opacities or effusions. No acute bony abnormality. IMPRESSION: No active cardiopulmonary disease. Electronically Signed   By: Rolm Baptise M.D.   On: 03/24/2021 03:50   MR 3D Recon At Scanner  Result Date: 03/24/2021 CLINICAL DATA:  History of chronic liver disease and epigastric pain, presenting to the emergency department with nausea and upper abdominal pain by report. EXAM: MRI ABDOMEN WITHOUT AND WITH CONTRAST (INCLUDING MRCP) TECHNIQUE: Multiplanar multisequence MR imaging of the abdomen was performed both before and after the administration of intravenous contrast. Heavily T2-weighted images of the biliary and pancreatic ducts were obtained, and three-dimensional MRCP images were rendered by post processing. CONTRAST:  67mL GADAVIST GADOBUTROL 1 MMOL/ML IV SOLN COMPARISON:  Ultrasound of  the abdomen of March 24, 2021 FINDINGS: Lower chest: Incidental imaging of the lung bases is unremarkable, no effusion or consolidative changes. Hepatobiliary: Heterogeneous appearance of the RIGHT hepatic lobe with masslike area in the central RIGHT hemi liver obstructing RIGHT biliary ducts. Area measuring approximately 4.9 x 2.9 cm. Scattered areas of diffusion abnormality in the LEFT hepatic lobe with mild LEFT-sided biliary duct distension though not to the same extent. Medial segment LEFT hepatic lobe with additional area of signal abnormality seen on image 35 of series  15 measuring 1.4 x 1.0 cm. Bulky celiac lymphadenopathy, nodal disease measuring up to 3.0 x 2.1 cm on image 34 of series 16 and tracking into the gastrohepatic recess. Scattered areas of T2 signal abnormality are suggested in the LEFT hepatic lobe lateral section which is hypertrophied while the RIGHT lobe is atrophied. 11 mm hepatic subsegment III lesion on image 28 of series 6 and a second lesion approximately 1 cm in the lateral segment of the LEFT hepatic lobe on image 25 of series 6. Suspected portal venous invasion with convex protrusion into the RIGHT portal vein on image 30 of series 16 Gallbladder is distended without pericholecystic stranding. Pancreas:  No focal, suspicious pancreatic lesion. Spleen:  Normal size and contour. Adrenals/Urinary Tract: LEFT adrenal lesion measuring 2.9 x 1.5 cm mildly heterogeneous. In and out of phase are limited by motion related artifact not allowing for definitive characterization there is a question of signal loss though motion artifact passes through the area of the LEFT adrenal. RIGHT adrenal gland is normal. The kidneys enhance symmetrically with small low signal foci likely small cysts. Stomach/Bowel: No acute gastrointestinal process to the extent evaluated. Vascular/Lymphatic: Patent abdominal vasculature. Gastrohepatic and celiac adenopathy. Other:  No ascites. Musculoskeletal: No  suspicious bone lesions identified. IMPRESSION: 1. RIGHT hepatic mass with biliary duct distension and suspected portal venous invasion and or occlusion. Findings are suspicious for cholangiocarcinoma or biphenotypic/cholangio hepatocellular neoplasm as biliary duct distension would be atypical for pure HCC. In a patient with known risk factors for hepatocellular carcinoma this would be classified as LI-RADS category M given infiltrative appearance and ductal dilation. Correlate with any risk factors for HCC. 2. Gastrohepatic adenopathy and suspected LEFT lobe lesions. Endoscopic sampling may be helpful for further assessment to confirm presence of extrahepatic disease and obtain diagnosis. 3. LEFT adrenal lesion raising the question of additional site of extrahepatic involvement, limited assessment due to motion related artifact. Further evaluation with adrenal protocol CT could be considered during further staging as warranted. 4. Gallbladder is distended without pericholecystic stranding. Electronically Signed   By: Zetta Bills M.D.   On: 03/24/2021 09:07   US BIOPSY (LIVER)  Result Date: 04/02/2021 INDICATION: 60 year old woman with multiple liver masses presents to interventional radiology for ultrasound-guided biopsy. EXAM: Ultrasound-guided liver lesion biopsy MEDICATIONS: None. ANESTHESIA/SEDATION: Moderate (conscious) sedation was employed during this procedure. A total of Versed 3 mg and Fentanyl 150 mcg was administered intravenously. Moderate Sedation Time: 16 minutes. The patient's level of consciousness and vital signs were monitored continuously by radiology nursing throughout the procedure under my direct supervision. COMPLICATIONS: None immediate. PROCEDURE: Informed written consent was obtained from the patient after a thorough discussion of the procedural risks, benefits and alternatives. All questions were addressed. Maximal Sterile Barrier Technique was utilized including caps, mask,  sterile gowns, sterile gloves, sterile drape, hand hygiene and skin antiseptic. A timeout was performed prior to the initiation of the procedure. Patient position supine on the ultrasound table. Epigastric skin prepped and draped in usual sterile fashion. Following local lidocaine administration, 18 gauge biopsy needle was advanced into 1 of the right liver lesions, and five cores were obtained utilizing continuous ultrasound guidance. Samples were sent to pathology in formalin. Needle removed and hemostasis achieved with 5 minutes of manual compression. Post procedure ultrasound images showed no evidence of significant hemorrhage. IMPRESSION: Ultrasound-guided biopsy of right liver lesion as above. Electronically Signed   By: Miachel Roux M.D.   On: 04/02/2021 12:33   MR ABDOMEN MRCP W WO  CONTAST  Result Date: 03/24/2021 CLINICAL DATA:  History of chronic liver disease and epigastric pain, presenting to the emergency department with nausea and upper abdominal pain by report. EXAM: MRI ABDOMEN WITHOUT AND WITH CONTRAST (INCLUDING MRCP) TECHNIQUE: Multiplanar multisequence MR imaging of the abdomen was performed both before and after the administration of intravenous contrast. Heavily T2-weighted images of the biliary and pancreatic ducts were obtained, and three-dimensional MRCP images were rendered by post processing. CONTRAST:  23mL GADAVIST GADOBUTROL 1 MMOL/ML IV SOLN COMPARISON:  Ultrasound of the abdomen of March 24, 2021 FINDINGS: Lower chest: Incidental imaging of the lung bases is unremarkable, no effusion or consolidative changes. Hepatobiliary: Heterogeneous appearance of the RIGHT hepatic lobe with masslike area in the central RIGHT hemi liver obstructing RIGHT biliary ducts. Area measuring approximately 4.9 x 2.9 cm. Scattered areas of diffusion abnormality in the LEFT hepatic lobe with mild LEFT-sided biliary duct distension though not to the same extent. Medial segment LEFT hepatic lobe with  additional area of signal abnormality seen on image 35 of series 15 measuring 1.4 x 1.0 cm. Bulky celiac lymphadenopathy, nodal disease measuring up to 3.0 x 2.1 cm on image 34 of series 16 and tracking into the gastrohepatic recess. Scattered areas of T2 signal abnormality are suggested in the LEFT hepatic lobe lateral section which is hypertrophied while the RIGHT lobe is atrophied. 11 mm hepatic subsegment III lesion on image 28 of series 6 and a second lesion approximately 1 cm in the lateral segment of the LEFT hepatic lobe on image 25 of series 6. Suspected portal venous invasion with convex protrusion into the RIGHT portal vein on image 30 of series 16 Gallbladder is distended without pericholecystic stranding. Pancreas:  No focal, suspicious pancreatic lesion. Spleen:  Normal size and contour. Adrenals/Urinary Tract: LEFT adrenal lesion measuring 2.9 x 1.5 cm mildly heterogeneous. In and out of phase are limited by motion related artifact not allowing for definitive characterization there is a question of signal loss though motion artifact passes through the area of the LEFT adrenal. RIGHT adrenal gland is normal. The kidneys enhance symmetrically with small low signal foci likely small cysts. Stomach/Bowel: No acute gastrointestinal process to the extent evaluated. Vascular/Lymphatic: Patent abdominal vasculature. Gastrohepatic and celiac adenopathy. Other:  No ascites. Musculoskeletal: No suspicious bone lesions identified. IMPRESSION: 1. RIGHT hepatic mass with biliary duct distension and suspected portal venous invasion and or occlusion. Findings are suspicious for cholangiocarcinoma or biphenotypic/cholangio hepatocellular neoplasm as biliary duct distension would be atypical for pure HCC. In a patient with known risk factors for hepatocellular carcinoma this would be classified as LI-RADS category M given infiltrative appearance and ductal dilation. Correlate with any risk factors for HCC. 2.  Gastrohepatic adenopathy and suspected LEFT lobe lesions. Endoscopic sampling may be helpful for further assessment to confirm presence of extrahepatic disease and obtain diagnosis. 3. LEFT adrenal lesion raising the question of additional site of extrahepatic involvement, limited assessment due to motion related artifact. Further evaluation with adrenal protocol CT could be considered during further staging as warranted. 4. Gallbladder is distended without pericholecystic stranding. Electronically Signed   By: Zetta Bills M.D.   On: 03/24/2021 09:07   US ABDOMEN LIMITED RUQ (LIVER/GB)  Addendum Date: 03/24/2021   ADDENDUM REPORT: 03/24/2021 06:15 ADDENDUM: These results were called by telephone at the time of interpretation on 03/24/2021 at 6:15 am to provider JADE SUNG , who verbally acknowledged these results. Electronically Signed   By: Lovena Le M.D.   On: 03/24/2021  06:15   Result Date: 03/24/2021 CLINICAL DATA:  Nausea and abdominal pain for 2 days EXAM: ULTRASOUND ABDOMEN LIMITED RIGHT UPPER QUADRANT COMPARISON:  None. FINDINGS: Gallbladder: Mild gallbladder distension. Some dependently layering shadowing gallstones and sludge are present. No significant gallbladder wall thickening. Sonographic Percell Miller sign is reportedly negative. Common bile duct: Diameter: 6.5 mm, borderline dilated. Liver: Diffusely inhomogeneous appearance of the liver with coarsened hepatic echogenicity and a nodular liver surface contour. Enlarged left lobe and caudate are noted. Multiple heterogeneous though predominantly hypoechoic masses are seen throughout the right lobe liver, largest measuring up to 5.3 cm, second largest up to 3.3 cm. No visible intrahepatic biliary ductal dilatation. Portal vein is patent on color Doppler imaging with normal direction of blood flow towards the liver. Other: Several small nodular masses are seen in the vicinity of the pancreatic head, possibly porta hepatic lymph nodes though difficult  to fully delineate given adjacent bowel gas. IMPRESSION: 1. Diffusely heterogeneous appearing liver with mildly nodular surface contour, could reflect a cirrhotic configuration or other intrinsic liver disease. 2. Multiple heterogeneous though predominantly hypoechoic masses in the right lobe liver, largest measuring up to 5.3 cm. Findings are highly suspicious for multifocal hepatocellular carcinoma versus metastatic disease. Consider further evaluation with dedicated liver MR. 3. Several small nodular masses in the vicinity of the pancreatic head and possibly porta hepatic lymph nodes. Could reflect adenopathy or other mass lesion. 4. Mild gallbladder distension with some dependently layering gallstones and sludge. No significant gallbladder wall thickening or sonographic Murphy sign to suggest acute cholecystitis. 5. Borderline dilatation of the common bile duct without visible intrahepatic biliary ductal dilatation. Correlate with liver serologies and consider addition of MRCP imaging where clinically appropriate. Electronically Signed: By: Lovena Le M.D. On: 03/24/2021 06:09     Assessment and plan- Patient is a 60 y.o. female with newly diagnosed large liver mass and pathology most likely consistent with an intrahepatic cholangiocarcinoma  MRI abdomen shows a heterogeneous appearing area in the right hepatic lobe measuring 4.9 x 2.9 cm.  There are areas of diffusion abnormality also noted in the left hepatic lobe with left-sided biliary distention and abnormal area measuring 1.4 x 1 cm in the left hepatic lobe.  Bulky celiac adenopathy 3 x 2.1 cm.  There is suspected portal venous invasion of the tumor.  Possible left adrenal lesion but could not be assessed adequately due to motion artifact.  Pathology shows poorly differentiated adenocarcinoma with immunohistochemistry positive for CK7 and CK19 suggestive of intrahepatic cholangiocarcinoma given the clinically picture.  CDX2 negative making:  Unlikely.  GATA3 negative and TTF-1 negative making breast and lung unlikely.  MRI abdomen did not show any overt pancreatic lesion.  This likely represents locally advanced but potentially metastatic cholangiocarcinoma.  Given the diffuse nature of involvement of both the right and left hepatic lobe as well as bulky celiac adenopathy this is not operable.  I would like to proceed with a PET CT scan to get a complete staging work-up and I will see her following the PET scan to discuss treatment options which would be palliative but not curative.  Surprisingly her CA 19-9 is negative and CEA is only mildly elevated.   Visit Diagnosis 1. Intrahepatic cholangiocarcinoma ([place])   2. Goals of care, counseling/discussion      Dr. Randa Evens, MD, MPH [facility] [phone] 04/10/2021 8:51 AM

## 2021-04-10 NOTE — Telephone Encounter (Signed)
Insurance verified as medicare advantage. Order has been sent to pathology.

## 2021-04-12 DIAGNOSIS — Z7189 Other specified counseling: Secondary | ICD-10-CM | POA: Insufficient documentation

## 2021-04-12 DIAGNOSIS — C221 Intrahepatic bile duct carcinoma: Secondary | ICD-10-CM | POA: Insufficient documentation

## 2021-04-15 ENCOUNTER — Ambulatory Visit: Payer: Medicare HMO

## 2021-04-16 ENCOUNTER — Other Ambulatory Visit: Payer: Self-pay

## 2021-04-16 ENCOUNTER — Ambulatory Visit
Admission: RE | Admit: 2021-04-16 | Discharge: 2021-04-16 | Disposition: A | Discharge status: Home / Self Care | Payer: Medicare HMO | Source: Ambulatory Visit | Attending: Oncology | Admitting: Oncology

## 2021-04-16 DIAGNOSIS — C269 Malignant neoplasm of ill-defined sites within the digestive system: Secondary | ICD-10-CM | POA: Insufficient documentation

## 2021-04-16 DIAGNOSIS — E049 Nontoxic goiter, unspecified: Secondary | ICD-10-CM | POA: Diagnosis not present

## 2021-04-16 DIAGNOSIS — I700 Atherosclerosis of aorta: Secondary | ICD-10-CM | POA: Diagnosis not present

## 2021-04-16 DIAGNOSIS — R591 Generalized enlarged lymph nodes: Secondary | ICD-10-CM | POA: Insufficient documentation

## 2021-04-16 DIAGNOSIS — C787 Secondary malignant neoplasm of liver and intrahepatic bile duct: Secondary | ICD-10-CM | POA: Diagnosis not present

## 2021-04-16 DIAGNOSIS — C3490 Malignant neoplasm of unspecified part of unspecified bronchus or lung: Secondary | ICD-10-CM | POA: Diagnosis not present

## 2021-04-16 LAB — GLUCOSE, CAPILLARY: Glucose-Capillary: 148 mg/dL — ABNORMAL HIGH (ref 70–99)

## 2021-04-16 MED ORDER — FLUDEOXYGLUCOSE F - 18 (FDG) INJECTION
8.1000 | Freq: Once | INTRAVENOUS | Status: AC | PRN
  Administered 2021-04-16: 8.46 via INTRAVENOUS

## 2021-04-17 ENCOUNTER — Inpatient Hospital Stay: Payer: Medicare HMO | Admitting: Oncology

## 2021-04-17 ENCOUNTER — Other Ambulatory Visit: Payer: Self-pay | Admitting: *Deleted

## 2021-04-17 ENCOUNTER — Other Ambulatory Visit: Payer: Medicare HMO

## 2021-04-17 ENCOUNTER — Inpatient Hospital Stay: Payer: Medicare HMO

## 2021-04-17 ENCOUNTER — Telehealth: Payer: Self-pay | Admitting: *Deleted

## 2021-04-17 DIAGNOSIS — C787 Secondary malignant neoplasm of liver and intrahepatic bile duct: Secondary | ICD-10-CM

## 2021-04-17 DIAGNOSIS — C221 Intrahepatic bile duct carcinoma: Secondary | ICD-10-CM

## 2021-04-17 DIAGNOSIS — R160 Hepatomegaly, not elsewhere classified: Secondary | ICD-10-CM

## 2021-04-17 NOTE — Progress Notes (Signed)
Tumor Board Documentation  Janice Short was presented by Dr Janese Banks at our Tumor Board on 04/17/2021, which included representatives from medical oncology,radiation oncology,internal medicine,navigation,pathology,radiology,surgical,research,pulmonology.  Janice Short currently presents as a new patient,for [facility],for new positive pathology with history of the following treatments: surgical intervention(s).  Additionally, we reviewed previous medical and familial history, history of present illness, and recent lab results along with all available histopathologic and imaging studies. The tumor board considered available treatment options and made the following recommendations: Surgery,Neoadjuvant chemotherapy (Refer to [facility])    The following procedures/referrals were also placed: No orders of the defined types were placed in this encounter.   Clinical Trial Status: not discussed   Staging used: AJCC Stage Group  AJCC Staging: T: 2 N: 0 M: 0 Group: Stage II Adenocarcinoma of Liver   National site-specific guidelines NCCN were discussed with respect to the case.  Tumor board is a meeting of clinicians from various specialty areas who evaluate and discuss patients for whom a multidisciplinary approach is being considered. Final determinations in the plan of care are those of the provider(s). The responsibility for follow up of recommendations given during tumor board is that of the provider.   Today's extended care, comprehensive team conference, [name] was not present for the discussion and was not examined.   Multidisciplinary Tumor Board is a multidisciplinary case peer review process.  Decisions discussed in the Multidisciplinary Tumor Board reflect the opinions of the specialists present at the conference without having examined the patient.  Ultimately, treatment and diagnostic decisions rest with the primary provider(s) and the patient.

## 2021-04-17 NOTE — Telephone Encounter (Signed)
Called pt and got voicemail and left message that we missed you not coming today for visit. Would like to see you tomorrow if possible and please call me at [phone].

## 2021-04-18 ENCOUNTER — Telehealth: Payer: Self-pay | Admitting: *Deleted

## 2021-04-18 NOTE — Telephone Encounter (Signed)
Called pt and checked to see if pt could come in today. She had an appt for yest. The pt. Janice Short that she had something lese done in out pt hospital. I could not find anything but she is agreeable to the appt Monday. I asked for today but she did not want to come today.

## 2021-04-19 ENCOUNTER — Other Ambulatory Visit: Payer: Self-pay | Admitting: *Deleted

## 2021-04-19 DIAGNOSIS — C221 Intrahepatic bile duct carcinoma: Secondary | ICD-10-CM

## 2021-04-21 ENCOUNTER — Encounter: Payer: Self-pay | Admitting: Gastroenterology

## 2021-04-21 ENCOUNTER — Other Ambulatory Visit: Payer: Self-pay

## 2021-04-21 ENCOUNTER — Inpatient Hospital Stay (HOSPITAL_BASED_OUTPATIENT_CLINIC_OR_DEPARTMENT_OTHER): Payer: Medicare HMO | Admitting: Oncology

## 2021-04-21 ENCOUNTER — Encounter: Payer: Self-pay | Admitting: Oncology

## 2021-04-21 ENCOUNTER — Ambulatory Visit (INDEPENDENT_AMBULATORY_CARE_PROVIDER_SITE_OTHER): Payer: Medicare HMO | Admitting: Gastroenterology

## 2021-04-21 ENCOUNTER — Inpatient Hospital Stay: Payer: Medicare HMO

## 2021-04-21 VITALS — BP 109/71 | HR 87 | Temp 98.0°F | Resp 16 | Ht 67.0 in | Wt 153.4 lb

## 2021-04-21 VITALS — BP 111/68 | HR 112 | Temp 97.5°F | Ht 67.0 in | Wt 153.6 lb

## 2021-04-21 DIAGNOSIS — Z794 Long term (current) use of insulin: Secondary | ICD-10-CM | POA: Diagnosis not present

## 2021-04-21 DIAGNOSIS — C221 Intrahepatic bile duct carcinoma: Secondary | ICD-10-CM

## 2021-04-21 DIAGNOSIS — Z7189 Other specified counseling: Secondary | ICD-10-CM | POA: Diagnosis not present

## 2021-04-21 DIAGNOSIS — R932 Abnormal findings on diagnostic imaging of liver and biliary tract: Secondary | ICD-10-CM

## 2021-04-21 DIAGNOSIS — Z7982 Long term (current) use of aspirin: Secondary | ICD-10-CM | POA: Diagnosis not present

## 2021-04-21 DIAGNOSIS — R5381 Other malaise: Secondary | ICD-10-CM | POA: Diagnosis not present

## 2021-04-21 DIAGNOSIS — Z79899 Other long term (current) drug therapy: Secondary | ICD-10-CM | POA: Diagnosis not present

## 2021-04-21 DIAGNOSIS — E785 Hyperlipidemia, unspecified: Secondary | ICD-10-CM | POA: Diagnosis not present

## 2021-04-21 DIAGNOSIS — Z1211 Encounter for screening for malignant neoplasm of colon: Secondary | ICD-10-CM

## 2021-04-21 DIAGNOSIS — R5383 Other fatigue: Secondary | ICD-10-CM | POA: Diagnosis not present

## 2021-04-21 DIAGNOSIS — I10 Essential (primary) hypertension: Secondary | ICD-10-CM | POA: Diagnosis not present

## 2021-04-21 DIAGNOSIS — C787 Secondary malignant neoplasm of liver and intrahepatic bile duct: Secondary | ICD-10-CM

## 2021-04-21 DIAGNOSIS — R69 Illness, unspecified: Secondary | ICD-10-CM | POA: Diagnosis not present

## 2021-04-21 LAB — CBC WITH DIFFERENTIAL/PLATELET
Abs Immature Granulocytes: 0.05 10*3/uL (ref 0.00–0.07)
Basophils Absolute: 0.1 10*3/uL (ref 0.0–0.1)
Basophils Relative: 1 %
Eosinophils Absolute: 0.3 10*3/uL (ref 0.0–0.5)
Eosinophils Relative: 3 %
HCT: 37.7 % (ref 36.0–46.0)
Hemoglobin: 12.6 g/dL (ref 12.0–15.0)
Immature Granulocytes: 0 %
Lymphocytes Relative: 26 %
Lymphs Abs: 3 10*3/uL (ref 0.7–4.0)
MCH: 31.6 pg (ref 26.0–34.0)
MCHC: 33.4 g/dL (ref 30.0–36.0)
MCV: 94.5 fL (ref 80.0–100.0)
Monocytes Absolute: 0.8 10*3/uL (ref 0.1–1.0)
Monocytes Relative: 7 %
Neutro Abs: 7.4 10*3/uL (ref 1.7–7.7)
Neutrophils Relative %: 63 %
Platelets: 321 10*3/uL (ref 150–400)
RBC: 3.99 MIL/uL (ref 3.87–5.11)
RDW: 15.8 % — ABNORMAL HIGH (ref 11.5–15.5)
Smear Review: NORMAL
WBC: 11.6 10*3/uL — ABNORMAL HIGH (ref 4.0–10.5)
nRBC: 0 % (ref 0.0–0.2)

## 2021-04-21 LAB — COMPREHENSIVE METABOLIC PANEL
ALT: 62 U/L — ABNORMAL HIGH (ref 0–44)
AST: 55 U/L — ABNORMAL HIGH (ref 15–41)
Albumin: 3.3 g/dL — ABNORMAL LOW (ref 3.5–5.0)
Alkaline Phosphatase: 733 U/L — ABNORMAL HIGH (ref 38–126)
Anion gap: 11 (ref 5–15)
BUN: 16 mg/dL (ref 8–23)
CO2: 23 mmol/L (ref 22–32)
Calcium: 9 mg/dL (ref 8.9–10.3)
Chloride: 101 mmol/L (ref 98–111)
Creatinine, Ser: 1.08 mg/dL — ABNORMAL HIGH (ref 0.44–1.00)
GFR, Estimated: 58 mL/min — ABNORMAL LOW (ref >60)
Glucose, Bld: 138 mg/dL — ABNORMAL HIGH (ref 70–99)
Potassium: 3.7 mmol/L (ref 3.5–5.1)
Sodium: 135 mmol/L (ref 135–145)
Total Bilirubin: 2.6 mg/dL — ABNORMAL HIGH (ref 0.3–1.2)
Total Protein: 8.9 g/dL — ABNORMAL HIGH (ref 6.5–8.1)

## 2021-04-21 LAB — HEPATITIS C ANTIBODY: HCV Ab: NONREACTIVE

## 2021-04-21 LAB — HEPATITIS B SURFACE ANTIGEN: Hepatitis B Surface Ag: NONREACTIVE

## 2021-04-21 LAB — HEPATITIS B CORE ANTIBODY, TOTAL: Hep B Core Total Ab: NONREACTIVE

## 2021-04-21 LAB — HIV ANTIBODY (ROUTINE TESTING W REFLEX): HIV Screen 4th Generation wRfx: NONREACTIVE

## 2021-04-21 LAB — TSH: TSH: 2.558 u[IU]/mL (ref 0.350–4.500)

## 2021-04-21 MED ORDER — SUPREP BOWEL PREP KIT 17.5-3.13-1.6 GM/177ML PO SOLN: 1.0000 | ORAL | 0 refills | Status: DC

## 2021-04-21 NOTE — Progress Notes (Signed)
Hematology/Oncology Consult note [facility]  Telephone:([phone] [phone]  Patient Care Team: Steele Sizer, MD as PCP - General (Family Medicine) Neldon Labella, RN as Case Manager Clent Jacks, RN as Oncology Nurse Navigator   Name of the patient: Janice Short  [id number]  Jan 30, 1961   Date of visit: 04/21/21  Diagnosis- locally advanced intrahepatic cholangiocarcinoma  Chief complaint/ Reason for visit- discuss pet ct results and further management  Heme/Onc history: Patient is a 60 year old African-American female with a past medical history significant for hypertension hyperlipidemia and type 2 diabetes who presented to the ER with symptoms of cramping abdominal pain and nausea. This was followed by right upper quadrant ultrasound which showed multiple echogenic masses in the liver with nodular contour of the liver possibly suggestive of cirrhosis. Several nodular masses in the vicinity of the pancreatic head and porta hepatic lymph nodes. This was followed by an MRI abdomen and MRCP. It showed right hepatic lobe masslike area measuring 4.9 x 2.9 cm. Diffusion abnormality in the left hepatic lobe with mild left-sided biliary ductal dilatation and another area of abnormality measuring 1.4 x 1 cm. Bulky celiac adenopathy 3 x 2.1 cm tracking into gastrohepatic recess. Suspected portal venous invasion with convex protrusion in the right portal vein. Left adrenal lesion measuring 2.9 x 1.5 cm. Findings concerning for cholangiocarcinoma or biphenotypic cholangiole hepatocellular neoplasm.   Patient underwent CT-guided liver biopsy Which was consistent with adenocarcinoma poorly differentiated.  Immunohistochemistry showed CK7 positivity, CDX2 negative, Heppar negative, GATA3 negative.  CK19 positive.  Differentials include cholangiocarcinoma, pancreatic, upper GI.  Lung and breast less likely.  However given CK19 positivity most  compatible with cholangiocarcinoma.  Interval history-other than mild fatigue patient currently denies any complaints  ECOG PS- 1 Pain scale- 0   Review of systems- Review of Systems  Constitutional: Positive for malaise/fatigue. Negative for chills, fever and weight loss.  HENT: Negative for congestion, ear discharge and nosebleeds.   Eyes: Negative for blurred vision.  Respiratory: Negative for cough, hemoptysis, sputum production, shortness of breath and wheezing.   Cardiovascular: Negative for chest pain, palpitations, orthopnea and claudication.  Gastrointestinal: Negative for abdominal pain, blood in stool, constipation, diarrhea, heartburn, melena, nausea and vomiting.  Genitourinary: Negative for dysuria, flank pain, frequency, hematuria and urgency.  Musculoskeletal: Negative for back pain, joint pain and myalgias.  Skin: Negative for rash.  Neurological: Negative for dizziness, tingling, focal weakness, seizures, weakness and headaches.  Endo/Heme/Allergies: Does not bruise/bleed easily.  Psychiatric/Behavioral: Negative for depression and suicidal ideas. The patient does not have insomnia.       Allergies  Allergen Reactions  . Other Shortness Of Breath    Cat dander and grass pollen  . Bydureon [Exenatide] Other (See Comments)    Pain with injection not allergy     Past Medical History:  Diagnosis Date  . Allergy   . Asthma   . Carpal tunnel syndrome on left   . Cervical radiculitis   . Chronic osteoarthritis   . Corns and callosity   . Depression   . Dermatophytosis of foot   . Diabetes mellitus without complication ([place])   . Gout   . Hyperlipidemia   . Hypertension   . Impingement syndrome of left shoulder   . Intrahepatic cholangiocarcinoma ([place])   . Lumbosacral neuritis   . Microalbuminuria   . Obesity   . Supraventricular tachycardia ([place]) 07/01/2015  . SVT (supraventricular tachycardia) ([place])   . Tenosynovitis of wrist   .  Vitamin D deficiency       Past Surgical History:  Procedure Laterality Date  . ABDOMINAL HYSTERECTOMY      Social History   Socioeconomic History  . Marital status: Single    Spouse name: Not on file  . Number of children: 2  . Years of education: Not on file  . Highest education level: Not on file  Occupational History  . Occupation: disable     Comment: from chronic back pain, 2019  Tobacco Use  . Smoking status: Current Some Day Smoker    Packs/day: 0.25    Types: Cigarettes  . Smokeless tobacco: Never Used  . Tobacco comment: 2 per day 01/30/21  Vaping Use  . Vaping Use: Never used  Substance and Sexual Activity  . Alcohol use: Yes    Alcohol/week: 0.0 standard drinks    Comment: rare maybe 1-2  year  . Drug use: No  . Sexual activity: Yes  Other Topics Concern  . Not on file  Social History Narrative   She is on disability for chronic back pain. Pt's son lives with her   Social Determinants of Health   Financial Resource Strain: Medium Risk  . Difficulty of Paying Living Expenses: Somewhat hard  Food Insecurity: No Food Insecurity  . Worried About Charity fundraiser in the Last Year: Never true  . Ran Out of Food in the Last Year: Never true  Transportation Needs: No Transportation Needs  . Lack of Transportation (Medical): No  . Lack of Transportation (Non-Medical): No  Physical Activity: Inactive  . Days of Exercise per Week: 0 days  . Minutes of Exercise per Session: 0 min  Stress: No Stress Concern Present  . Feeling of Stress : Not at all  Social Connections: Moderately Isolated  . Frequency of Communication with Friends and Family: More than three times a week  . Frequency of Social Gatherings with Friends and Family: Three times a week  . Attends Religious Services: More than 4 times per year  . Active Member of Clubs or Organizations: No  . Attends Archivist Meetings: Never  . Marital Status: Never married  Intimate Partner Violence: Not At Risk  . Fear  of Current or Ex-Partner: No  . Emotionally Abused: No  . Physically Abused: No  . Sexually Abused: No    Family History  Problem Relation Age of Onset  . Heart attack Mother   . CAD Mother   . Kidney disease Father      Current Outpatient Medications:  .  albuterol (VENTOLIN HFA) 108 (90 Base) MCG/ACT inhaler, INHALE 2 PUFFS BY MOUTH EVERY 6 HOURS AS NEEDED FOR WHEEZING AND FOR SHORTNESS OF BREATH, Disp: 18 g, Rfl: 1 .  aspirin 81 MG tablet, Take 1 tablet by mouth daily., Disp: , Rfl:  .  atorvastatin (LIPITOR) 40 MG tablet, Take 1 tablet (40 mg total) by mouth at bedtime., Disp: 90 tablet, Rfl: 1 .  BAYER MICROLET LANCETS lancets, Use as instructed, Disp: 100 each, Rfl: 12 .  diphenhydramine-acetaminophen (TYLENOL PM) 25-500 MG TABS tablet, Take 1 tablet by mouth at bedtime as needed., Disp: , Rfl:  .  Empagliflozin-metFORMIN HCl ER (SYNJARDY XR) 12.04-999 MG TB24, Take 2 tablets by mouth daily. New dose, Disp: 180 tablet, Rfl: 1 .  fluticasone (FLONASE) 50 MCG/ACT nasal spray, USE 2 SPRAY(S) IN EACH NOSTRIL AT BEDTIME (Patient taking differently: Place 2 sprays into both nostrils daily as needed. USE 2 SPRAY(S) IN EACH NOSTRIL  AT BEDTIME), Disp: 16 g, Rfl: 0 .  Fluticasone-Umeclidin-Vilant (TRELEGY ELLIPTA) 100-62.5-25 MCG/INH AEPB, Take 1 puff by mouth daily., Disp: 180 each, Rfl: 1 .  gabapentin (NEURONTIN) 300 MG capsule, Take 2 capsules (600 mg total) by mouth 2 (two) times daily., Disp: 360 capsule, Rfl: 1 .  glucose blood (BAYER CONTOUR TEST) test strip, Use as instructed, Disp: 100 each, Rfl: 2 .  Insulin Degludec-Liraglutide (XULTOPHY) 100-3.6 UNIT-MG/ML SOPN, Inject 50 Units into the skin daily., Disp: 15 mL, Rfl: 1 .  Insulin Pen Needle 31G X 8 MM MISC, 1 each by Does not apply route daily., Disp: 100 each, Rfl: 1 .  losartan-hydrochlorothiazide (HYZAAR) 50-12.5 MG tablet, Take 1 tablet by mouth daily., Disp: 90 tablet, Rfl: 1  Physical exam:  Vitals:   04/21/21 0924  BP:  109/71  Pulse: 87  Resp: 16  Temp: 98 F (36.7 C)  TempSrc: Oral  Weight: 153 lb 6.4 oz (69.6 kg)  Height: _0  (1.702 m)   Physical Exam HENT:     Head: Normocephalic and atraumatic.  Eyes:     Comments: Mild scleral icterus  Cardiovascular:     Rate and Rhythm: Normal rate and regular rhythm.     Heart sounds: Normal heart sounds.  Pulmonary:     Effort: Pulmonary effort is normal.     Breath sounds: Normal breath sounds.  Abdominal:     General: Bowel sounds are normal.     Palpations: Abdomen is soft.  Skin:    General: Skin is warm and dry.  Neurological:     Mental Status: She is alert and oriented to person, place, and time.      CMP Latest Ref Rng & Units 04/21/2021  Glucose 70 - 99 mg/dL 138(H)  BUN 8 - 23 mg/dL 16  Creatinine 0.44 - 1.00 mg/dL 1.08(H)  Sodium 135 - 145 mmol/L 135  Potassium 3.5 - 5.1 mmol/L 3.7  Chloride 98 - 111 mmol/L 101  CO2 22 - 32 mmol/L 23  Calcium 8.9 - 10.3 mg/dL 9.0  Total Protein 6.5 - 8.1 g/dL 8.9(H)  Total Bilirubin 0.3 - 1.2 mg/dL 2.6(H)  Alkaline Phos 38 - 126 U/L 733(H)  AST 15 - 41 U/L 55(H)  ALT 0 - 44 U/L 62(H)   CBC Latest Ref Rng & Units 04/21/2021  WBC 4.0 - 10.5 K/uL 11.6(H)  Hemoglobin 12.0 - 15.0 g/dL 12.6  Hematocrit 36.0 - 46.0 % 37.7  Platelets 150 - 400 K/uL 321    No images are attached to the encounter.  DG Chest 2 View  Result Date: 03/24/2021 CLINICAL DATA:  Left shoulder pain, upper abdominal pain EXAM: CHEST - 2 VIEW COMPARISON:  None. FINDINGS: Heart and mediastinal contours are within normal limits. No focal opacities or effusions. No acute bony abnormality. IMPRESSION: No active cardiopulmonary disease. Electronically Signed   By: Rolm Baptise M.D.   On: 03/24/2021 03:50   MR 3D Recon At Scanner  Result Date: 03/24/2021 CLINICAL DATA:  History of chronic liver disease and epigastric pain, presenting to the emergency department with nausea and upper abdominal pain by report. EXAM: MRI ABDOMEN  WITHOUT AND WITH CONTRAST (INCLUDING MRCP) TECHNIQUE: Multiplanar multisequence MR imaging of the abdomen was performed both before and after the administration of intravenous contrast. Heavily T2-weighted images of the biliary and pancreatic ducts were obtained, and three-dimensional MRCP images were rendered by post processing. CONTRAST:  61m GADAVIST GADOBUTROL 1 MMOL/ML IV SOLN COMPARISON:  Ultrasound of the abdomen of  March 24, 2021 FINDINGS: Lower chest: Incidental imaging of the lung bases is unremarkable, no effusion or consolidative changes. Hepatobiliary: Heterogeneous appearance of the RIGHT hepatic lobe with masslike area in the central RIGHT hemi liver obstructing RIGHT biliary ducts. Area measuring approximately 4.9 x 2.9 cm. Scattered areas of diffusion abnormality in the LEFT hepatic lobe with mild LEFT-sided biliary duct distension though not to the same extent. Medial segment LEFT hepatic lobe with additional area of signal abnormality seen on image 35 of series 15 measuring 1.4 x 1.0 cm. Bulky celiac lymphadenopathy, nodal disease measuring up to 3.0 x 2.1 cm on image 34 of series 16 and tracking into the gastrohepatic recess. Scattered areas of T2 signal abnormality are suggested in the LEFT hepatic lobe lateral section which is hypertrophied while the RIGHT lobe is atrophied. 11 mm hepatic subsegment III lesion on image 28 of series 6 and a second lesion approximately 1 cm in the lateral segment of the LEFT hepatic lobe on image 25 of series 6. Suspected portal venous invasion with convex protrusion into the RIGHT portal vein on image 30 of series 16 Gallbladder is distended without pericholecystic stranding. Pancreas:  No focal, suspicious pancreatic lesion. Spleen:  Normal size and contour. Adrenals/Urinary Tract: LEFT adrenal lesion measuring 2.9 x 1.5 cm mildly heterogeneous. In and out of phase are limited by motion related artifact not allowing for definitive characterization there is a  question of signal loss though motion artifact passes through the area of the LEFT adrenal. RIGHT adrenal gland is normal. The kidneys enhance symmetrically with small low signal foci likely small cysts. Stomach/Bowel: No acute gastrointestinal process to the extent evaluated. Vascular/Lymphatic: Patent abdominal vasculature. Gastrohepatic and celiac adenopathy. Other:  No ascites. Musculoskeletal: No suspicious bone lesions identified. IMPRESSION: 1. RIGHT hepatic mass with biliary duct distension and suspected portal venous invasion and or occlusion. Findings are suspicious for cholangiocarcinoma or biphenotypic/cholangio hepatocellular neoplasm as biliary duct distension would be atypical for pure HCC. In a patient with known risk factors for hepatocellular carcinoma this would be classified as LI-RADS category M given infiltrative appearance and ductal dilation. Correlate with any risk factors for HCC. 2. Gastrohepatic adenopathy and suspected LEFT lobe lesions. Endoscopic sampling may be helpful for further assessment to confirm presence of extrahepatic disease and obtain diagnosis. 3. LEFT adrenal lesion raising the question of additional site of extrahepatic involvement, limited assessment due to motion related artifact. Further evaluation with adrenal protocol CT could be considered during further staging as warranted. 4. Gallbladder is distended without pericholecystic stranding. Electronically Signed   By: Zetta Bills M.D.   On: 03/24/2021 09:07   NM PET Image Initial (PI) Skull Base To Thigh  Result Date: 04/17/2021 CLINICAL DATA:  Initial treatment strategy for poorly differentiated adenocarcinoma from recent liver biopsy. EXAM: NUCLEAR MEDICINE PET SKULL BASE TO THIGH TECHNIQUE: 8.46 mCi F-18 FDG was injected intravenously. Full-ring PET imaging was performed from the skull base to thigh after the radiotracer. CT data was obtained and used for attenuation correction and anatomic localization.  Fasting blood glucose: 148 mg/dl COMPARISON:  MRI abdomen 03/24/2021 FINDINGS: Mediastinal blood pool activity: SUV max 2.58 Liver activity: SUV max NA NECK: No hypermetabolic lymph nodes in the neck. Incidental CT findings: Thyroid goiter without thyroid nodules. Opacification of the right maxillary sinus with thickened sinus walls likely due to chronic sinus disease or large mucous retention cyst. CHEST: No hypermetabolic mediastinal or hilar nodes. No suspicious pulmonary nodules on the CT scan. No hypermetabolic breast masses,  supraclavicular or axillary adenopathy. Incidental CT findings: Scattered age advanced atherosclerotic calcifications involving the aorta and coronary arteries. ABDOMEN/PELVIS: Numerous hypermetabolic hepatic lesions as demonstrated on the recent MRI. Difficult to identify and measure the liver lesions on the noncontrast CT scan. The largest lesion in the right hepatic lobe has an SUV max of 6.56. Hypermetabolic periportal and hepatoduodenal ligament lymphadenopathy is hypermetabolic with SUV max of 2.13. No obvious GI tract primary neoplasm is identified. Specifically, do not see any evidence of the colon cancer gastric lesion or pancreatic lesion. Incidental CT findings: Stable vascular calcifications. Stable cholelithiasis. SKELETON: No findings for osseous metastatic disease. Incidental CT findings: none IMPRESSION: 1. Hypermetabolic hepatic lesions correlating with the recent MRI. 2. Hypermetabolic periportal and hepatoduodenal ligament lymphadenopathy. 3. No obvious primary neoplasm is demonstrated in the chest, abdomen or pelvis. 4. No findings for osseous metastatic disease. Electronically Signed   By: Marijo Sanes M.D.   On: 04/17/2021 11:33   US BIOPSY (LIVER)  Result Date: 04/02/2021 INDICATION: 60 year old woman with multiple liver masses presents to interventional radiology for ultrasound-guided biopsy. EXAM: Ultrasound-guided liver lesion biopsy MEDICATIONS: None.  ANESTHESIA/SEDATION: Moderate (conscious) sedation was employed during this procedure. A total of Versed 3 mg and Fentanyl 150 mcg was administered intravenously. Moderate Sedation Time: 16 minutes. The patient's level of consciousness and vital signs were monitored continuously by radiology nursing throughout the procedure under my direct supervision. COMPLICATIONS: None immediate. PROCEDURE: Informed written consent was obtained from the patient after a thorough discussion of the procedural risks, benefits and alternatives. All questions were addressed. Maximal Sterile Barrier Technique was utilized including caps, mask, sterile gowns, sterile gloves, sterile drape, hand hygiene and skin antiseptic. A timeout was performed prior to the initiation of the procedure. Patient position supine on the ultrasound table. Epigastric skin prepped and draped in usual sterile fashion. Following local lidocaine administration, 18 gauge biopsy needle was advanced into 1 of the right liver lesions, and five cores were obtained utilizing continuous ultrasound guidance. Samples were sent to pathology in formalin. Needle removed and hemostasis achieved with 5 minutes of manual compression. Post procedure ultrasound images showed no evidence of significant hemorrhage. IMPRESSION: Ultrasound-guided biopsy of right liver lesion as above. Electronically Signed   By: Miachel Roux M.D.   On: 04/02/2021 12:33   MR ABDOMEN MRCP W WO CONTAST  Result Date: 03/24/2021 CLINICAL DATA:  History of chronic liver disease and epigastric pain, presenting to the emergency department with nausea and upper abdominal pain by report. EXAM: MRI ABDOMEN WITHOUT AND WITH CONTRAST (INCLUDING MRCP) TECHNIQUE: Multiplanar multisequence MR imaging of the abdomen was performed both before and after the administration of intravenous contrast. Heavily T2-weighted images of the biliary and pancreatic ducts were obtained, and three-dimensional MRCP images were  rendered by post processing. CONTRAST:  12m GADAVIST GADOBUTROL 1 MMOL/ML IV SOLN COMPARISON:  Ultrasound of the abdomen of March 24, 2021 FINDINGS: Lower chest: Incidental imaging of the lung bases is unremarkable, no effusion or consolidative changes. Hepatobiliary: Heterogeneous appearance of the RIGHT hepatic lobe with masslike area in the central RIGHT hemi liver obstructing RIGHT biliary ducts. Area measuring approximately 4.9 x 2.9 cm. Scattered areas of diffusion abnormality in the LEFT hepatic lobe with mild LEFT-sided biliary duct distension though not to the same extent. Medial segment LEFT hepatic lobe with additional area of signal abnormality seen on image 35 of series 15 measuring 1.4 x 1.0 cm. Bulky celiac lymphadenopathy, nodal disease measuring up to 3.0 x 2.1 cm on image 34  of series 16 and tracking into the gastrohepatic recess. Scattered areas of T2 signal abnormality are suggested in the LEFT hepatic lobe lateral section which is hypertrophied while the RIGHT lobe is atrophied. 11 mm hepatic subsegment III lesion on image 28 of series 6 and a second lesion approximately 1 cm in the lateral segment of the LEFT hepatic lobe on image 25 of series 6. Suspected portal venous invasion with convex protrusion into the RIGHT portal vein on image 30 of series 16 Gallbladder is distended without pericholecystic stranding. Pancreas:  No focal, suspicious pancreatic lesion. Spleen:  Normal size and contour. Adrenals/Urinary Tract: LEFT adrenal lesion measuring 2.9 x 1.5 cm mildly heterogeneous. In and out of phase are limited by motion related artifact not allowing for definitive characterization there is a question of signal loss though motion artifact passes through the area of the LEFT adrenal. RIGHT adrenal gland is normal. The kidneys enhance symmetrically with small low signal foci likely small cysts. Stomach/Bowel: No acute gastrointestinal process to the extent evaluated. Vascular/Lymphatic: Patent  abdominal vasculature. Gastrohepatic and celiac adenopathy. Other:  No ascites. Musculoskeletal: No suspicious bone lesions identified. IMPRESSION: 1. RIGHT hepatic mass with biliary duct distension and suspected portal venous invasion and or occlusion. Findings are suspicious for cholangiocarcinoma or biphenotypic/cholangio hepatocellular neoplasm as biliary duct distension would be atypical for pure HCC. In a patient with known risk factors for hepatocellular carcinoma this would be classified as LI-RADS category M given infiltrative appearance and ductal dilation. Correlate with any risk factors for HCC. 2. Gastrohepatic adenopathy and suspected LEFT lobe lesions. Endoscopic sampling may be helpful for further assessment to confirm presence of extrahepatic disease and obtain diagnosis. 3. LEFT adrenal lesion raising the question of additional site of extrahepatic involvement, limited assessment due to motion related artifact. Further evaluation with adrenal protocol CT could be considered during further staging as warranted. 4. Gallbladder is distended without pericholecystic stranding. Electronically Signed   By: Zetta Bills M.D.   On: 03/24/2021 09:07   US ABDOMEN LIMITED RUQ (LIVER/GB)  Addendum Date: 03/24/2021   ADDENDUM REPORT: 03/24/2021 06:15 ADDENDUM: These results were called by telephone at the time of interpretation on 03/24/2021 at 6:15 am to provider JADE SUNG , who verbally acknowledged these results. Electronically Signed   By: Lovena Le M.D.   On: 03/24/2021 06:15   Result Date: 03/24/2021 CLINICAL DATA:  Nausea and abdominal pain for 2 days EXAM: ULTRASOUND ABDOMEN LIMITED RIGHT UPPER QUADRANT COMPARISON:  None. FINDINGS: Gallbladder: Mild gallbladder distension. Some dependently layering shadowing gallstones and sludge are present. No significant gallbladder wall thickening. Sonographic Percell Miller sign is reportedly negative. Common bile duct: Diameter: 6.5 mm, borderline dilated. Liver:  Diffusely inhomogeneous appearance of the liver with coarsened hepatic echogenicity and a nodular liver surface contour. Enlarged left lobe and caudate are noted. Multiple heterogeneous though predominantly hypoechoic masses are seen throughout the right lobe liver, largest measuring up to 5.3 cm, second largest up to 3.3 cm. No visible intrahepatic biliary ductal dilatation. Portal vein is patent on color Doppler imaging with normal direction of blood flow towards the liver. Other: Several small nodular masses are seen in the vicinity of the pancreatic head, possibly porta hepatic lymph nodes though difficult to fully delineate given adjacent bowel gas. IMPRESSION: 1. Diffusely heterogeneous appearing liver with mildly nodular surface contour, could reflect a cirrhotic configuration or other intrinsic liver disease. 2. Multiple heterogeneous though predominantly hypoechoic masses in the right lobe liver, largest measuring up to 5.3 cm. Findings are  highly suspicious for multifocal hepatocellular carcinoma versus metastatic disease. Consider further evaluation with dedicated liver MR. 3. Several small nodular masses in the vicinity of the pancreatic head and possibly porta hepatic lymph nodes. Could reflect adenopathy or other mass lesion. 4. Mild gallbladder distension with some dependently layering gallstones and sludge. No significant gallbladder wall thickening or sonographic Murphy sign to suggest acute cholecystitis. 5. Borderline dilatation of the common bile duct without visible intrahepatic biliary ductal dilatation. Correlate with liver serologies and consider addition of MRCP imaging where clinically appropriate. Electronically Signed: By: Lovena Le M.D. On: 03/24/2021 06:09     Assessment and plan- Patient is a 60 y.o. female with locally advanced intrahepatic cholangiocarcinoma likely stage II T2 N0 M0  I have reviewed PET/CT scan images independently and we have reviewed her MRI as well as PET  scan images at tumor board.  Although the MRI and PET scan findingsReport locoregional adenopathy on review at tumor board this all appears to be 1 large tumor instead of multiple hepatic lesions involving the right hepatic lobe and extension into the caudate lobe which is appearing as periportal/hepatoduodenal adenopathy.  PET scan did not show any evidence of distant metastatic disease.  Based on pathology given that it was positive forCK19 and negative for CDX2.  Appears to be a cholangiocarcinoma in the appropriate clinical context.  Tumor was also positive for CK7 and negative for CK20.  There is no primary pancreatic mass seen on PET scan or MRI.  However her baseline CA 19-9 is normal which is somewhat unusual.  Tumor also did not stain positive for HCC stainsAnd AFP levels are also normal.  Presently the working diagnosis is intrahepatic cholangiocarcinoma  Question is if this is upfront resectable or not and I will refer her to [facility] Dr. Hyman Hopes for the same.  We will also be referring her to GI Dr. Allen Norris for an EGD and a colonoscopy.  Suspect the elevated LFTs and bilirubin is secondary to obstruction from the tumor and I will await opinion from Dr. Allen Norris to see if it would be possible to do an ERCP or PTC at this time to bring down the bilirubin.  If patient is not deemed upfront resectable it would be reasonable to proceed with neoadjuvant chemotherapy with gemcitabine and cisplatin but this will be decided after surgical consult.  Patient comprehends my plan well  Follow-up with me to be decided based on GI and surgery opinion  Cancer Staging Intrahepatic cholangiocarcinoma ([place]) Staging form: Intrahepatic Bile Duct, AJCC 8th Edition - Clinical: Stage II (cT2, cN0, cM0) - Signed by Sindy Guadeloupe, MD on 04/21/2021     Visit Diagnosis 1. Intrahepatic cholangiocarcinoma ([place])   2. Goals of care, counseling/discussion      Dr. Randa Evens, MD, MPH [facility] at [facility] [phone] 04/21/2021 12:03 PM

## 2021-04-21 NOTE — Progress Notes (Signed)
Gastroenterology Consultation  Referring Provider:     Steele Sizer, MD Primary Care Physician:  Steele Sizer, MD Primary Gastroenterologist:  Dr. Allen Norris     Reason for Consultation:     Liver mass        HPI:   Janice Short is a 60 y.o. y/o female referred for consultation & management of liver mass by Dr. Ancil Boozer, Drue Stager, MD.  This patient comes in today after having a biopsy of her liver that showed:  DIAGNOSIS:  A. LIVER, RIGHT; ULTRASOUND-GUIDED BIOPSY:  - POORLY DIFFERENTIATED ADENOCARCINOMA WITH NECROSIS.  Based on the pattern of immunoreactivity the differential includes  primary/metastatic cholangiocarcinoma, metastatic upper GI (stomach),  and metastatic pancreatic carcinoma. Metastatic lung, breast, and  colorectal carcinoma are considered less likely. The histologic findings and pattern of immunoreactivity would be  compatible with cholangiocarcinoma in the proper clinical context.   A referral was placed today for the patient to be seen as soon as possible due to the need for evaluation at [facility] for possible surgery and the need prior to undergoing any surgery to make sure the patient does not have any other GI malignancies of the thought was to have the patient set up for an EGD and colonoscopy.  The patient had an MRCP that showed:  IMPRESSION: 1. RIGHT hepatic mass with biliary duct distension and suspected portal venous invasion and or occlusion. Findings are suspicious for cholangiocarcinoma or biphenotypic/cholangio hepatocellular neoplasm as biliary duct distension would be atypical for pure HCC. In a patient with known risk factors for hepatocellular carcinoma this would be classified as LI-RADS category M given infiltrative appearance and ductal dilation. Correlate with any risk factors for HCC. 2. Gastrohepatic adenopathy and suspected LEFT lobe lesions. Endoscopic sampling may be helpful for further assessment to confirm presence of extrahepatic  disease and obtain diagnosis. 3. LEFT adrenal lesion raising the question of additional site of extrahepatic involvement, limited assessment due to motion related artifact. Further evaluation with adrenal protocol CT could be considered during further staging as warranted. 4. Gallbladder is distended without pericholecystic stranding.  The patient's liver enzymes showed: Component     Latest Ref Rng & Units 03/24/2021 04/21/2021           AST     15 - 41 U/L 35 55 (H)  ALT     0 - 44 U/L 41 62 (H)  Alkaline Phosphatase     38 - 126 U/L 619 (H) 733 (H)  Total Bilirubin     0.3 - 1.2 mg/dL 2.0 (H) 2.6 (H)  GFR, Estimated     >60 mL/min 56 (L) 58 (L)   Her CA 19-9 and alpha-fetoprotein were normal with her CEA being only slightly elevated.  The patient denies ever having an EGD and colonoscopy.   Past Medical History:  Diagnosis Date  . Allergy   . Asthma   . Carpal tunnel syndrome on left   . Cervical radiculitis   . Chronic osteoarthritis   . Corns and callosity   . Depression   . Dermatophytosis of foot   . Diabetes mellitus without complication ([place])   . Gout   . Hyperlipidemia   . Hypertension   . Impingement syndrome of left shoulder   . Intrahepatic cholangiocarcinoma ([place])   . Lumbosacral neuritis   . Microalbuminuria   . Obesity   . Supraventricular tachycardia ([place]) 07/01/2015  . SVT (supraventricular tachycardia) ([place])   . Tenosynovitis of wrist   . Vitamin D  deficiency     Past Surgical History:  Procedure Laterality Date  . ABDOMINAL HYSTERECTOMY      Prior to Admission medications   Medication Sig Start Date End Date Taking? Authorizing Provider  albuterol (VENTOLIN HFA) 108 (90 Base) MCG/ACT inhaler INHALE 2 PUFFS BY MOUTH EVERY 6 HOURS AS NEEDED FOR WHEEZING AND FOR SHORTNESS OF BREATH 02/15/20   Steele Sizer, MD  aspirin 81 MG tablet Take 1 tablet by mouth daily. 11/05/09   [provider]  atorvastatin (LIPITOR) 40 MG tablet Take 1  tablet (40 mg total) by mouth at bedtime. 12/26/20   Steele Sizer, MD  BAYER MICROLET LANCETS lancets Use as instructed 02/08/19   Hubbard Hartshorn, FNP  diphenhydramine-acetaminophen (TYLENOL PM) 25-500 MG TABS tablet Take 1 tablet by mouth at bedtime as needed.    [provider]  Empagliflozin-metFORMIN HCl ER (SYNJARDY XR) 12.04-999 MG TB24 Take 2 tablets by mouth daily. New dose 12/26/20   Sowles, Drue Stager, MD  fluticasone (FLONASE) 50 MCG/ACT nasal spray USE 2 SPRAY(S) IN EACH NOSTRIL AT BEDTIME Patient taking differently: Place 2 sprays into both nostrils daily as needed. USE 2 SPRAY(S) IN EACH NOSTRIL AT BEDTIME 02/19/21   Steele Sizer, MD  Fluticasone-Umeclidin-Vilant (TRELEGY ELLIPTA) 100-62.5-25 MCG/INH AEPB Take 1 puff by mouth daily. 12/26/20   Steele Sizer, MD  gabapentin (NEURONTIN) 300 MG capsule Take 2 capsules (600 mg total) by mouth 2 (two) times daily. 12/26/20   Steele Sizer, MD  glucose blood (BAYER CONTOUR TEST) test strip Use as instructed 02/08/19   Hubbard Hartshorn, FNP  Insulin Degludec-Liraglutide (XULTOPHY) 100-3.6 UNIT-MG/ML SOPN Inject 50 Units into the skin daily. 12/26/20   Steele Sizer, MD  Insulin Pen Needle 31G X 8 MM MISC 1 each by Does not apply route daily. 12/26/20   Steele Sizer, MD  losartan-hydrochlorothiazide (HYZAAR) 50-12.5 MG tablet Take 1 tablet by mouth daily. 12/26/20   Steele Sizer, MD    Family History  Problem Relation Age of Onset  . Heart attack Mother   . CAD Mother   . Kidney disease Father      Social History   Tobacco Use  . Smoking status: Current Some Day Smoker    Packs/day: 0.25    Types: Cigarettes  . Smokeless tobacco: Never Used  . Tobacco comment: 2 per day 01/30/21  Vaping Use  . Vaping Use: Never used  Substance Use Topics  . Alcohol use: Yes    Alcohol/week: 0.0 standard drinks    Comment: rare maybe 1-2  year  . Drug use: No    Allergies as of 04/21/2021 - Review Complete 04/21/2021  Allergen  Reaction Noted  . Other Shortness Of Breath 09/01/2016  . Bydureon [exenatide] Other (See Comments) 06/18/2017    Review of Systems:    All systems reviewed and negative except where noted in HPI.   Physical Exam:  There were no vitals taken for this visit. No LMP recorded. Patient has had a hysterectomy. General:   Alert,  Well-developed, well-nourished, pleasant and cooperative in NAD Head:  Normocephalic and atraumatic. Eyes:  Sclera clear, no icterus.   Conjunctiva pink. Ears:  Normal auditory acuity. Neck:  Supple; no masses or thyromegaly. Lungs:  Respirations even and unlabored.  Clear throughout to auscultation.   No wheezes, crackles, or rhonchi. No acute distress. Heart:  Regular rate and rhythm; no murmurs, clicks, rubs, or gallops. Abdomen:  Normal bowel sounds.  No bruits.  Soft, non-tender and non-distended without masses, hepatosplenomegaly  or hernias noted.  No guarding or rebound tenderness.  Negative Carnett sign.   Rectal:  Deferred.  Pulses:  Normal pulses noted. Extremities:  No clubbing or edema.  No cyanosis. Neurologic:  Alert and oriented x3;  grossly normal neurologically. Skin:  Intact without significant lesions or rashes.  No jaundice. Lymph Nodes:  No significant cervical adenopathy. Psych:  Alert and cooperative. Normal mood and affect.  Imaging Studies: DG Chest 2 View  Result Date: 03/24/2021 CLINICAL DATA:  Left shoulder pain, upper abdominal pain EXAM: CHEST - 2 VIEW COMPARISON:  None. FINDINGS: Heart and mediastinal contours are within normal limits. No focal opacities or effusions. No acute bony abnormality. IMPRESSION: No active cardiopulmonary disease. Electronically Signed   By: Rolm Baptise M.D.   On: 03/24/2021 03:50   MR 3D Recon At Scanner  Result Date: 03/24/2021 CLINICAL DATA:  History of chronic liver disease and epigastric pain, presenting to the emergency department with nausea and upper abdominal pain by report. EXAM: MRI ABDOMEN  WITHOUT AND WITH CONTRAST (INCLUDING MRCP) TECHNIQUE: Multiplanar multisequence MR imaging of the abdomen was performed both before and after the administration of intravenous contrast. Heavily T2-weighted images of the biliary and pancreatic ducts were obtained, and three-dimensional MRCP images were rendered by post processing. CONTRAST:  78mL GADAVIST GADOBUTROL 1 MMOL/ML IV SOLN COMPARISON:  Ultrasound of the abdomen of March 24, 2021 FINDINGS: Lower chest: Incidental imaging of the lung bases is unremarkable, no effusion or consolidative changes. Hepatobiliary: Heterogeneous appearance of the RIGHT hepatic lobe with masslike area in the central RIGHT hemi liver obstructing RIGHT biliary ducts. Area measuring approximately 4.9 x 2.9 cm. Scattered areas of diffusion abnormality in the LEFT hepatic lobe with mild LEFT-sided biliary duct distension though not to the same extent. Medial segment LEFT hepatic lobe with additional area of signal abnormality seen on image 35 of series 15 measuring 1.4 x 1.0 cm. Bulky celiac lymphadenopathy, nodal disease measuring up to 3.0 x 2.1 cm on image 34 of series 16 and tracking into the gastrohepatic recess. Scattered areas of T2 signal abnormality are suggested in the LEFT hepatic lobe lateral section which is hypertrophied while the RIGHT lobe is atrophied. 11 mm hepatic subsegment III lesion on image 28 of series 6 and a second lesion approximately 1 cm in the lateral segment of the LEFT hepatic lobe on image 25 of series 6. Suspected portal venous invasion with convex protrusion into the RIGHT portal vein on image 30 of series 16 Gallbladder is distended without pericholecystic stranding. Pancreas:  No focal, suspicious pancreatic lesion. Spleen:  Normal size and contour. Adrenals/Urinary Tract: LEFT adrenal lesion measuring 2.9 x 1.5 cm mildly heterogeneous. In and out of phase are limited by motion related artifact not allowing for definitive characterization there is a  question of signal loss though motion artifact passes through the area of the LEFT adrenal. RIGHT adrenal gland is normal. The kidneys enhance symmetrically with small low signal foci likely small cysts. Stomach/Bowel: No acute gastrointestinal process to the extent evaluated. Vascular/Lymphatic: Patent abdominal vasculature. Gastrohepatic and celiac adenopathy. Other:  No ascites. Musculoskeletal: No suspicious bone lesions identified. IMPRESSION: 1. RIGHT hepatic mass with biliary duct distension and suspected portal venous invasion and or occlusion. Findings are suspicious for cholangiocarcinoma or biphenotypic/cholangio hepatocellular neoplasm as biliary duct distension would be atypical for pure HCC. In a patient with known risk factors for hepatocellular carcinoma this would be classified as LI-RADS category M given infiltrative appearance and ductal dilation. Correlate with any  risk factors for [place]. 2. Gastrohepatic adenopathy and suspected LEFT lobe lesions. Endoscopic sampling may be helpful for further assessment to confirm presence of extrahepatic disease and obtain diagnosis. 3. LEFT adrenal lesion raising the question of additional site of extrahepatic involvement, limited assessment due to motion related artifact. Further evaluation with adrenal protocol CT could be considered during further staging as warranted. 4. Gallbladder is distended without pericholecystic stranding. Electronically Signed   By: Zetta Bills M.D.   On: 03/24/2021 09:07   NM PET Image Initial (PI) Skull Base To Thigh  Result Date: 04/17/2021 CLINICAL DATA:  Initial treatment strategy for poorly differentiated adenocarcinoma from recent liver biopsy. EXAM: NUCLEAR MEDICINE PET SKULL BASE TO THIGH TECHNIQUE: 8.46 mCi F-18 FDG was injected intravenously. Full-ring PET imaging was performed from the skull base to thigh after the radiotracer. CT data was obtained and used for attenuation correction and anatomic localization.  Fasting blood glucose: 148 mg/dl COMPARISON:  MRI abdomen 03/24/2021 FINDINGS: Mediastinal blood pool activity: SUV max 2.58 Liver activity: SUV max NA NECK: No hypermetabolic lymph nodes in the neck. Incidental CT findings: Thyroid goiter without thyroid nodules. Opacification of the right maxillary sinus with thickened sinus walls likely due to chronic sinus disease or large mucous retention cyst. CHEST: No hypermetabolic mediastinal or hilar nodes. No suspicious pulmonary nodules on the CT scan. No hypermetabolic breast masses, supraclavicular or axillary adenopathy. Incidental CT findings: Scattered age advanced atherosclerotic calcifications involving the aorta and coronary arteries. ABDOMEN/PELVIS: Numerous hypermetabolic hepatic lesions as demonstrated on the recent MRI. Difficult to identify and measure the liver lesions on the noncontrast CT scan. The largest lesion in the right hepatic lobe has an SUV max of 6.56. Hypermetabolic periportal and hepatoduodenal ligament lymphadenopathy is hypermetabolic with SUV max of 7.16. No obvious GI tract primary neoplasm is identified. Specifically, do not see any evidence of the colon cancer gastric lesion or pancreatic lesion. Incidental CT findings: Stable vascular calcifications. Stable cholelithiasis. SKELETON: No findings for osseous metastatic disease. Incidental CT findings: none IMPRESSION: 1. Hypermetabolic hepatic lesions correlating with the recent MRI. 2. Hypermetabolic periportal and hepatoduodenal ligament lymphadenopathy. 3. No obvious primary neoplasm is demonstrated in the chest, abdomen or pelvis. 4. No findings for osseous metastatic disease. Electronically Signed   By: Marijo Sanes M.D.   On: 04/17/2021 11:33   US BIOPSY (LIVER)  Result Date: 04/02/2021 INDICATION: 60 year old woman with multiple liver masses presents to interventional radiology for ultrasound-guided biopsy. EXAM: Ultrasound-guided liver lesion biopsy MEDICATIONS: None.  ANESTHESIA/SEDATION: Moderate (conscious) sedation was employed during this procedure. A total of Versed 3 mg and Fentanyl 150 mcg was administered intravenously. Moderate Sedation Time: 16 minutes. The patient's level of consciousness and vital signs were monitored continuously by radiology nursing throughout the procedure under my direct supervision. COMPLICATIONS: None immediate. PROCEDURE: Informed written consent was obtained from the patient after a thorough discussion of the procedural risks, benefits and alternatives. All questions were addressed. Maximal Sterile Barrier Technique was utilized including caps, mask, sterile gowns, sterile gloves, sterile drape, hand hygiene and skin antiseptic. A timeout was performed prior to the initiation of the procedure. Patient position supine on the ultrasound table. Epigastric skin prepped and draped in usual sterile fashion. Following local lidocaine administration, 18 gauge biopsy needle was advanced into 1 of the right liver lesions, and five cores were obtained utilizing continuous ultrasound guidance. Samples were sent to pathology in formalin. Needle removed and hemostasis achieved with 5 minutes of manual compression. Post procedure ultrasound images showed no  evidence of significant hemorrhage. IMPRESSION: Ultrasound-guided biopsy of right liver lesion as above. Electronically Signed   By: Miachel Roux M.D.   On: 04/02/2021 12:33   MR ABDOMEN MRCP W WO CONTAST  Result Date: 03/24/2021 CLINICAL DATA:  History of chronic liver disease and epigastric pain, presenting to the emergency department with nausea and upper abdominal pain by report. EXAM: MRI ABDOMEN WITHOUT AND WITH CONTRAST (INCLUDING MRCP) TECHNIQUE: Multiplanar multisequence MR imaging of the abdomen was performed both before and after the administration of intravenous contrast. Heavily T2-weighted images of the biliary and pancreatic ducts were obtained, and three-dimensional MRCP images were  rendered by post processing. CONTRAST:  89mL GADAVIST GADOBUTROL 1 MMOL/ML IV SOLN COMPARISON:  Ultrasound of the abdomen of March 24, 2021 FINDINGS: Lower chest: Incidental imaging of the lung bases is unremarkable, no effusion or consolidative changes. Hepatobiliary: Heterogeneous appearance of the RIGHT hepatic lobe with masslike area in the central RIGHT hemi liver obstructing RIGHT biliary ducts. Area measuring approximately 4.9 x 2.9 cm. Scattered areas of diffusion abnormality in the LEFT hepatic lobe with mild LEFT-sided biliary duct distension though not to the same extent. Medial segment LEFT hepatic lobe with additional area of signal abnormality seen on image 35 of series 15 measuring 1.4 x 1.0 cm. Bulky celiac lymphadenopathy, nodal disease measuring up to 3.0 x 2.1 cm on image 34 of series 16 and tracking into the gastrohepatic recess. Scattered areas of T2 signal abnormality are suggested in the LEFT hepatic lobe lateral section which is hypertrophied while the RIGHT lobe is atrophied. 11 mm hepatic subsegment III lesion on image 28 of series 6 and a second lesion approximately 1 cm in the lateral segment of the LEFT hepatic lobe on image 25 of series 6. Suspected portal venous invasion with convex protrusion into the RIGHT portal vein on image 30 of series 16 Gallbladder is distended without pericholecystic stranding. Pancreas:  No focal, suspicious pancreatic lesion. Spleen:  Normal size and contour. Adrenals/Urinary Tract: LEFT adrenal lesion measuring 2.9 x 1.5 cm mildly heterogeneous. In and out of phase are limited by motion related artifact not allowing for definitive characterization there is a question of signal loss though motion artifact passes through the area of the LEFT adrenal. RIGHT adrenal gland is normal. The kidneys enhance symmetrically with small low signal foci likely small cysts. Stomach/Bowel: No acute gastrointestinal process to the extent evaluated. Vascular/Lymphatic: Patent  abdominal vasculature. Gastrohepatic and celiac adenopathy. Other:  No ascites. Musculoskeletal: No suspicious bone lesions identified. IMPRESSION: 1. RIGHT hepatic mass with biliary duct distension and suspected portal venous invasion and or occlusion. Findings are suspicious for cholangiocarcinoma or biphenotypic/cholangio hepatocellular neoplasm as biliary duct distension would be atypical for pure HCC. In a patient with known risk factors for hepatocellular carcinoma this would be classified as LI-RADS category M given infiltrative appearance and ductal dilation. Correlate with any risk factors for HCC. 2. Gastrohepatic adenopathy and suspected LEFT lobe lesions. Endoscopic sampling may be helpful for further assessment to confirm presence of extrahepatic disease and obtain diagnosis. 3. LEFT adrenal lesion raising the question of additional site of extrahepatic involvement, limited assessment due to motion related artifact. Further evaluation with adrenal protocol CT could be considered during further staging as warranted. 4. Gallbladder is distended without pericholecystic stranding. Electronically Signed   By: Zetta Bills M.D.   On: 03/24/2021 09:07   US ABDOMEN LIMITED RUQ (LIVER/GB)  Addendum Date: 03/24/2021   ADDENDUM REPORT: 03/24/2021 06:15 ADDENDUM: These results were called by  telephone at the time of interpretation on 03/24/2021 at 6:15 am to provider JADE SUNG , who verbally acknowledged these results. Electronically Signed   By: Lovena Le M.D.   On: 03/24/2021 06:15   Result Date: 03/24/2021 CLINICAL DATA:  Nausea and abdominal pain for 2 days EXAM: ULTRASOUND ABDOMEN LIMITED RIGHT UPPER QUADRANT COMPARISON:  None. FINDINGS: Gallbladder: Mild gallbladder distension. Some dependently layering shadowing gallstones and sludge are present. No significant gallbladder wall thickening. Sonographic Percell Miller sign is reportedly negative. Common bile duct: Diameter: 6.5 mm, borderline dilated. Liver:  Diffusely inhomogeneous appearance of the liver with coarsened hepatic echogenicity and a nodular liver surface contour. Enlarged left lobe and caudate are noted. Multiple heterogeneous though predominantly hypoechoic masses are seen throughout the right lobe liver, largest measuring up to 5.3 cm, second largest up to 3.3 cm. No visible intrahepatic biliary ductal dilatation. Portal vein is patent on color Doppler imaging with normal direction of blood flow towards the liver. Other: Several small nodular masses are seen in the vicinity of the pancreatic head, possibly porta hepatic lymph nodes though difficult to fully delineate given adjacent bowel gas. IMPRESSION: 1. Diffusely heterogeneous appearing liver with mildly nodular surface contour, could reflect a cirrhotic configuration or other intrinsic liver disease. 2. Multiple heterogeneous though predominantly hypoechoic masses in the right lobe liver, largest measuring up to 5.3 cm. Findings are highly suspicious for multifocal hepatocellular carcinoma versus metastatic disease. Consider further evaluation with dedicated liver MR. 3. Several small nodular masses in the vicinity of the pancreatic head and possibly porta hepatic lymph nodes. Could reflect adenopathy or other mass lesion. 4. Mild gallbladder distension with some dependently layering gallstones and sludge. No significant gallbladder wall thickening or sonographic Murphy sign to suggest acute cholecystitis. 5. Borderline dilatation of the common bile duct without visible intrahepatic biliary ductal dilatation. Correlate with liver serologies and consider addition of MRCP imaging where clinically appropriate. Electronically Signed: By: Lovena Le M.D. On: 03/24/2021 06:09    Assessment and Plan:   NIZA SODERHOLM is a 60 y.o. y/o female who comes in today with what appears to be cholangiocarcinoma.  The patient is supposed to be followed by [facility] for possible surgery to resect the tumor.  The  oncologists had called me earlier this morning to be seen by me.  I reviewed the MRCP.  The obstructive lesion may not be amenable to stenting.  The patient will be set up for an EGD and colonoscopy to rule out any other malignancies in the GI tract prior to undergoing any surgeries at [facility].  The patient has been explained the plan and agrees with it.    Lucilla Lame, MD. Marval Regal    Note: This dictation was prepared with Dragon dictation along with smaller phrase technology. Any transcriptional errors that result from this process are unintentional.

## 2021-04-22 ENCOUNTER — Telehealth: Payer: Self-pay

## 2021-04-22 DIAGNOSIS — C221 Intrahepatic bile duct carcinoma: Secondary | ICD-10-CM | POA: Diagnosis not present

## 2021-04-22 LAB — HEPATITIS B SURFACE ANTIBODY, QUANTITATIVE: Hep B S AB Quant (Post): <3.1 m[IU]/mL — ABNORMAL LOW (ref >9.9)

## 2021-04-22 NOTE — Telephone Encounter (Signed)
Spoke with Janice Short on the phone. I have arranged a surgical consult with Dr. Hyman Hopes at [facility]. Due to his schedule they would like to see her this Friday, 04/25/21 at 1500. He will not have any appointments until June if she is unable to make this appointment. She became rather irritated stating she is sick of [place] rushing her and that she needs time to do these things. I reiterated that we only want to expedite her care for the best outcomes. She calmed some and said she would try to get a ride with someone. She did not ask for address or location. I ask her to kindly let me know if she was not planning on going so that I could cancel the appointment. We can arrange it around her transportation on a later date. She stated to leave the appointment. I have ask her to call be back regarding her transportation.

## 2021-04-22 NOTE — Telephone Encounter (Signed)
Referral sent to Dr. Hyman Hopes at [facility].

## 2021-04-23 ENCOUNTER — Encounter: Payer: Self-pay | Admitting: Oncology

## 2021-04-23 ENCOUNTER — Other Ambulatory Visit: Payer: Self-pay | Admitting: Family Medicine

## 2021-04-23 ENCOUNTER — Telehealth: Payer: Self-pay

## 2021-04-23 DIAGNOSIS — E1165 Type 2 diabetes mellitus with hyperglycemia: Secondary | ICD-10-CM

## 2021-04-23 DIAGNOSIS — E1142 Type 2 diabetes mellitus with diabetic polyneuropathy: Secondary | ICD-10-CM

## 2021-04-23 DIAGNOSIS — IMO0002 Reserved for concepts with insufficient information to code with codable children: Secondary | ICD-10-CM

## 2021-04-23 DIAGNOSIS — E785 Hyperlipidemia, unspecified: Secondary | ICD-10-CM

## 2021-04-23 NOTE — Telephone Encounter (Signed)
Received call from Janice Short stating that she will be able to make her appointment on 5/20 with Dr. Hyman Hopes. I have printed her google directions and she will pick them up at the registration desk.

## 2021-04-24 NOTE — Telephone Encounter (Signed)
Next appt 04/29/21

## 2021-04-24 NOTE — Telephone Encounter (Signed)
Requested medications are due for refill today yes  Requested medications are on the active medication list yes  Last refill 04/22/2020??  Last visit 12/2020  Future visit scheduled 04/2021  Notes to clinic Looks as if last rx (written 12/2020) was not filled, and last refill is 04/2020. Just making sure patient is taking med as instructed since last refill was one year ago.

## 2021-04-25 ENCOUNTER — Telehealth: Payer: Self-pay

## 2021-04-25 DIAGNOSIS — G5600 Carpal tunnel syndrome, unspecified upper limb: Secondary | ICD-10-CM | POA: Diagnosis not present

## 2021-04-25 DIAGNOSIS — E785 Hyperlipidemia, unspecified: Secondary | ICD-10-CM | POA: Diagnosis not present

## 2021-04-25 DIAGNOSIS — J45909 Unspecified asthma, uncomplicated: Secondary | ICD-10-CM | POA: Diagnosis not present

## 2021-04-25 DIAGNOSIS — R69 Illness, unspecified: Secondary | ICD-10-CM | POA: Diagnosis not present

## 2021-04-25 DIAGNOSIS — R160 Hepatomegaly, not elsewhere classified: Secondary | ICD-10-CM | POA: Diagnosis not present

## 2021-04-25 DIAGNOSIS — I10 Essential (primary) hypertension: Secondary | ICD-10-CM | POA: Diagnosis not present

## 2021-04-25 DIAGNOSIS — C221 Intrahepatic bile duct carcinoma: Secondary | ICD-10-CM | POA: Diagnosis not present

## 2021-04-25 DIAGNOSIS — E119 Type 2 diabetes mellitus without complications: Secondary | ICD-10-CM | POA: Diagnosis not present

## 2021-04-25 DIAGNOSIS — C772 Secondary and unspecified malignant neoplasm of intra-abdominal lymph nodes: Secondary | ICD-10-CM | POA: Diagnosis not present

## 2021-04-25 NOTE — Telephone Encounter (Signed)
Received call from Janice Short stating she went to her surgery referral at [facility] and they refused to see her until she had a MRI done. She states that she has had an MRI and she is correct. She was upset because she was not made aware of this scan being scheduled. I was unaware of this imaging being added to her appointment. I do see a CT scheduled for today at 1515 and made her aware that this is different from the MRI and that I am not familiar with what imaging they require. I asked if she was going to have the CT and again reiterated that it is not scheduled as an MRI. She continues to be upset stating the lady at the desk told her MRI. I can only go by what appointments I see scheduled. In the end she is not going for the CT. I have made Dr. Janese Banks aware and if we can determine what she needs in the way of imaging, we can attempt to get her rescheduled. At this time she is to upset and states she is never going back.

## 2021-04-28 ENCOUNTER — Telehealth: Payer: Self-pay | Admitting: Oncology

## 2021-04-28 NOTE — Progress Notes (Signed)
Name: Janice Short   MRN: [id number]    DOB: 07/28/1961   Date:04/29/2021       Progress Note  Subjective  Chief Complaint  Follow Up  HPI  DMII: She is on 50 units of Xultophy and also Synajrdi , however her A1C went from  8.1 % and it is down to 4.8 % today.  She has dyslipidemia, but states neuropathy symptoms have resolved. Since April she has not been able to eat much due to new diagnosis of cholangiocarcinoma. We will stop Xultophy today and change to tresiba - low dose and consider stopping Synjardi depending on values. Still taking statin therapy   Cancer:  she has lost 44 lbs in the past year, she was not trying when she was seen in our office back in January 22 we had discussed colonoscopy , lung CT and look for cancer but she chose a fit test and asked to hold off on further testing. She went to [facility] on [facility]/2021 with nausea/abdominal cramping  and was diagnosed with intrahepatic holangiocarcinoma. She is seeing Dr. Janese Banks ( oncologist ) , she had a PET scan done 04/16/2021, she had a visit with surgeon and was not advisable to have surgery, she will have EGD and colonoscopy and is going to chemo soon if she decides to proceed with it   MDD: she has a long history of depression, periods of crying spell, lack of motivation, took lexapro for a while, she had weaned self off on her own, but feeling worse since new diagnosis of cancer and would like to resume therapy    Asthma/copd syndrome: still smokes cigarettes intermittently, used to be a heavy smoker, but down to less than 0.25 pack daily, she states cough has improved very seldom has wheezing,  she denies SOB with activity . She uses Trelegy but not daily , last time was one week ago, explained it expires after being opened for 60 days   ENI:DPOEUM medication, no chest pain or palpitation, bp is low and we will stop hctz and continue losartan for now   Chronic back pain: she has spinal stenosis, never had surgery she has disability.  She states pain is daily, described as aching , right now is 7/10 , very seldom has radiculitis down left side, but no saddle anesthesia. Unchanged   Patient Active Problem List   Diagnosis Date Noted  . Intrahepatic cholangiocarcinoma ([place]) 04/12/2021  . Goals of care, counseling/discussion 04/12/2021  . Mild episode of recurrent major depressive disorder ([place]) 02/08/2019  . Dyslipidemia associated with type 2 diabetes mellitus ([place]) 12/17/2017  . Noncompliance with medication regimen 12/17/2017  . Spinal stenosis 10/15/2016  . Diabetic polyneuropathy associated with type 2 diabetes mellitus ([place]) 07/14/2016  . Chronic midline low back pain with sciatica 06/22/2016  . Primary osteoarthritis of both knees 06/22/2016  . Carpal tunnel syndrome 07/01/2015  . Cervical radiculitis 07/01/2015  . Corn or callus 07/01/2015  . Impingement syndrome of shoulder 07/01/2015  . Microalbuminuria 07/01/2015  . Compulsive tobacco user syndrome 07/01/2015  . Tenosynovitis of wrist 07/01/2015  . Benign hypertension 11/05/2009  . Uncontrolled type 2 diabetes mellitus with microalbuminuria ([place]) 11/05/2009  . Athlete's foot 08/05/2009  . Combined fat and carbohydrate induced hyperlipemia 01/08/2009  . Vitamin D deficiency 01/08/2009  . Asthma, mild intermittent 12/28/2008  . Allergic rhinitis 12/28/2008  . Lumbosacral neuritis 08/11/2007    Past Surgical History:  Procedure Laterality Date  . ABDOMINAL HYSTERECTOMY      Family History  Problem Relation Age of Onset  . Heart attack Mother   . CAD Mother   . Kidney disease Father     Social History   Tobacco Use  . Smoking status: Current Some Day Smoker    Packs/day: 0.25    Types: Cigarettes  . Smokeless tobacco: Never Used  . Tobacco comment: 2 per day 01/30/21  Substance Use Topics  . Alcohol use: Yes    Alcohol/week: 0.0 standard drinks    Comment: rare maybe 1-2  year     Current Outpatient Medications:  .  albuterol (VENTOLIN  HFA) 108 (90 Base) MCG/ACT inhaler, INHALE 2 PUFFS BY MOUTH EVERY 6 HOURS AS NEEDED FOR WHEEZING AND FOR SHORTNESS OF BREATH, Disp: 18 g, Rfl: 1 .  aspirin 81 MG tablet, Take 1 tablet by mouth daily., Disp: , Rfl:  .  atorvastatin (LIPITOR) 40 MG tablet, Take 1 tablet (40 mg total) by mouth at bedtime., Disp: 90 tablet, Rfl: 1 .  BAYER MICROLET LANCETS lancets, Use as instructed, Disp: 100 each, Rfl: 12 .  diphenhydramine-acetaminophen (TYLENOL PM) 25-500 MG TABS tablet, Take 1 tablet by mouth at bedtime as needed., Disp: , Rfl:  .  Empagliflozin-metFORMIN HCl ER (SYNJARDY XR) 12.04-999 MG TB24, Take 2 tablets by mouth daily. New dose, Disp: 180 tablet, Rfl: 1 .  fluticasone (FLONASE) 50 MCG/ACT nasal spray, USE 2 SPRAY(S) IN EACH NOSTRIL AT BEDTIME (Patient taking differently: Place 2 sprays into both nostrils daily as needed. USE 2 SPRAY(S) IN EACH NOSTRIL AT BEDTIME), Disp: 16 g, Rfl: 0 .  Fluticasone-Umeclidin-Vilant (TRELEGY ELLIPTA) 100-62.5-25 MCG/INH AEPB, Take 1 puff by mouth daily., Disp: 180 each, Rfl: 1 .  gabapentin (NEURONTIN) 300 MG capsule, Take 2 capsules (600 mg total) by mouth 2 (two) times daily., Disp: 360 capsule, Rfl: 1 .  glucose blood (BAYER CONTOUR TEST) test strip, Use as instructed, Disp: 100 each, Rfl: 2 .  Insulin Pen Needle 31G X 8 MM MISC, 1 each by Does not apply route daily., Disp: 100 each, Rfl: 1 .  Na Sulfate-K Sulfate-Mg Sulf (SUPREP BOWEL PREP KIT) 17.5-3.13-1.6 GM/177ML SOLN, Take 1 kit by mouth as directed., Disp: 354 mL, Rfl: 0  Allergies  Allergen Reactions  . Other Shortness Of Breath    Cat dander and grass pollen  . Bydureon [Exenatide] Other (See Comments)    Pain with injection not allergy    I personally reviewed active problem list, medication list, allergies, family history, social history, health maintenance with the patient/caregiver today.   ROS  Constitutional: Negative for fever , positive for weight change.  Respiratory: Negative for  cough and shortness of breath.   Cardiovascular: Negative for chest pain or palpitations.  Gastrointestinal: Negative for abdominal pain, no bowel changes.  Musculoskeletal: Negative for gait problem or joint swelling.  Skin: Negative for rash.  Neurological: Negative for dizziness or headache.  No other specific complaints in a complete review of systems (except as listed in HPI above).  Objective  Vitals:   04/29/21 1506  BP: 112/64  Pulse: 94  Resp: 18  Temp: 97.8 F (36.6 C)  TempSrc: Oral  SpO2: 98%  Weight: 155 lb 9.6 oz (70.6 kg)  Height: $Remove'5\' 7"'CyhACNA$  (1.702 m)    Body mass index is 24.37 kg/m.  Physical Exam  Constitutional: Patient appears well-developed and malnourished, with temporal waisting No distress.  HEENT: head atraumatic, normocephalic, pupils equal and reactive to light, neck supple, throat within normal limits Cardiovascular: Normal rate, regular rhythm and  normal heart sounds.  No murmur heard. No BLE edema. Pulmonary/Chest: Effort normal and breath sounds normal. No respiratory distress. Abdominal: Soft.  There is no tenderness. Psychiatric: cried during the visit, upset about diagnosis     PHQ2/9: Depression screen [facility] 2/9 04/29/2021 01/30/2021 12/26/2020 02/15/2020 01/30/2020  Decreased Interest 1 0 0 1 0  Down, Depressed, Hopeless 1 0 0 1 0  PHQ - 2 Score 2 0 0 2 0  Altered sleeping 0 - 0 0 -  Tired, decreased energy 0 - 0 0 -  Change in appetite 1 - 0 3 -  Feeling bad or failure about yourself  0 - 0 0 -  Trouble concentrating 0 - 0 0 -  Moving slowly or fidgety/restless 0 - 0 0 -  Suicidal thoughts 0 - 0 0 -  PHQ-9 Score 3 - 0 5 -  Difficult doing work/chores Not difficult at all - - - -  Some recent data might be hidden    phq 9 is negative   Fall Risk: Fall Risk  04/29/2021 01/30/2021 12/26/2020 02/15/2020 01/30/2020  Falls in the past year? 0 0 0 0 0  Number falls in past yr: 0 0 0 0 0  Injury with Fall? 0 0 0 0 0  Comment - - - - -  Risk  for fall due to : - No Fall Risks - - No Fall Risks  Follow up Falls prevention discussed Falls prevention discussed - Falls evaluation completed Falls prevention discussed     Functional Status Survey: Is the patient deaf or have difficulty hearing?: No Does the patient have difficulty seeing, even when wearing glasses/contacts?: No Does the patient have difficulty concentrating, remembering, or making decisions?: No Does the patient have difficulty walking or climbing stairs?: No Does the patient have difficulty dressing or bathing?: No Does the patient have difficulty doing errands alone such as visiting a doctor's office or shopping?: No    Assessment & Plan  1. Diabetes mellitus type 2 with complications (HCC)  - POCT HgB A1C - Insulin Degludec (TRESIBA) 100 UNIT/ML SOLN; Inject 12-30 units of lipase into the skin daily. To keep fasting glucose between 100-140  Dispense: 15 mL; Refill: 0 - blood glucose meter kit and supplies; Dispense based on patient and insurance preference. Use up to four times daily as directed. (FOR ICD-10 E10.9, E11.9).  Dispense: 1 each; Refill: 0 - AMB Referral to [facility]  2. Intrahepatic cholangiocarcinoma ([place])  Keep follow up with oncologist   3. Moderate protein-calorie malnutrition ([place])  Discussed Glucerna or premier shake   4. Diabetic polyneuropathy associated with type 2 diabetes mellitus (HCC)  - gabapentin (NEURONTIN) 300 MG capsule; Take 2 capsules (600 mg total) by mouth 2 (two) times daily.  Dispense: 360 capsule; Refill: 1  5. Chronic midline low back pain with sciatica, sciatica laterality unspecified  - gabapentin (NEURONTIN) 300 MG capsule; Take 2 capsules (600 mg total) by mouth 2 (two) times daily.  Dispense: 360 capsule; Refill: 1  6. Spinal stenosis of lumbosacral region  - gabapentin (NEURONTIN) 300 MG capsule; Take 2 capsules (600 mg total) by mouth 2 (two) times daily.  Dispense: 360 capsule; Refill:  1  7. Benign hypertension  We will stop losartan hctz since bp is low  - losartan (COZAAR) 25 MG tablet; Take 1 tablet (25 mg total) by mouth daily.  Dispense: 90 tablet; Refill: 0  8. Controlled type 2 diabetes mellitus with microalbuminuria, with long-term current use of  insulin ([place])   9. Asthma-COPD overlap syndrome ([place])  Doing well    10. Moderate episode of recurrent major depressive disorder (HCC)  - escitalopram (LEXAPRO) 10 MG tablet; Take 1 tablet (10 mg total) by mouth daily.  Dispense: 90 tablet; Refill: 0

## 2021-04-28 NOTE — Telephone Encounter (Signed)
Left VM with patient to make her aware of appointment scheduled tomorrow 5/24 @ 10:30 am.

## 2021-04-29 ENCOUNTER — Other Ambulatory Visit: Payer: Self-pay

## 2021-04-29 ENCOUNTER — Ambulatory Visit (INDEPENDENT_AMBULATORY_CARE_PROVIDER_SITE_OTHER): Payer: Medicare HMO | Admitting: Family Medicine

## 2021-04-29 ENCOUNTER — Encounter: Payer: Self-pay | Admitting: Family Medicine

## 2021-04-29 ENCOUNTER — Inpatient Hospital Stay (HOSPITAL_BASED_OUTPATIENT_CLINIC_OR_DEPARTMENT_OTHER): Payer: Medicare HMO | Admitting: Oncology

## 2021-04-29 ENCOUNTER — Encounter: Payer: Self-pay | Admitting: Oncology

## 2021-04-29 VITALS — BP 112/64 | HR 94 | Temp 97.8°F | Resp 18 | Ht 67.0 in | Wt 155.6 lb

## 2021-04-29 VITALS — BP 112/67 | HR 90 | Temp 98.2°F | Resp 16 | Ht 67.0 in | Wt 155.7 lb

## 2021-04-29 DIAGNOSIS — C221 Intrahepatic bile duct carcinoma: Secondary | ICD-10-CM

## 2021-04-29 DIAGNOSIS — R5381 Other malaise: Secondary | ICD-10-CM | POA: Diagnosis not present

## 2021-04-29 DIAGNOSIS — Z79899 Other long term (current) drug therapy: Secondary | ICD-10-CM | POA: Diagnosis not present

## 2021-04-29 DIAGNOSIS — G8929 Other chronic pain: Secondary | ICD-10-CM

## 2021-04-29 DIAGNOSIS — M4807 Spinal stenosis, lumbosacral region: Secondary | ICD-10-CM

## 2021-04-29 DIAGNOSIS — R69 Illness, unspecified: Secondary | ICD-10-CM | POA: Diagnosis not present

## 2021-04-29 DIAGNOSIS — M5440 Lumbago with sciatica, unspecified side: Secondary | ICD-10-CM | POA: Diagnosis not present

## 2021-04-29 DIAGNOSIS — R945 Abnormal results of liver function studies: Secondary | ICD-10-CM | POA: Diagnosis not present

## 2021-04-29 DIAGNOSIS — E1129 Type 2 diabetes mellitus with other diabetic kidney complication: Secondary | ICD-10-CM

## 2021-04-29 DIAGNOSIS — J449 Chronic obstructive pulmonary disease, unspecified: Secondary | ICD-10-CM | POA: Diagnosis not present

## 2021-04-29 DIAGNOSIS — Z794 Long term (current) use of insulin: Secondary | ICD-10-CM | POA: Diagnosis not present

## 2021-04-29 DIAGNOSIS — F331 Major depressive disorder, recurrent, moderate: Secondary | ICD-10-CM

## 2021-04-29 DIAGNOSIS — Z7189 Other specified counseling: Secondary | ICD-10-CM | POA: Diagnosis not present

## 2021-04-29 DIAGNOSIS — E1142 Type 2 diabetes mellitus with diabetic polyneuropathy: Secondary | ICD-10-CM

## 2021-04-29 DIAGNOSIS — Z7982 Long term (current) use of aspirin: Secondary | ICD-10-CM | POA: Diagnosis not present

## 2021-04-29 DIAGNOSIS — E118 Type 2 diabetes mellitus with unspecified complications: Secondary | ICD-10-CM

## 2021-04-29 DIAGNOSIS — E440 Moderate protein-calorie malnutrition: Secondary | ICD-10-CM

## 2021-04-29 DIAGNOSIS — E1165 Type 2 diabetes mellitus with hyperglycemia: Secondary | ICD-10-CM

## 2021-04-29 DIAGNOSIS — R7989 Other specified abnormal findings of blood chemistry: Secondary | ICD-10-CM

## 2021-04-29 DIAGNOSIS — E785 Hyperlipidemia, unspecified: Secondary | ICD-10-CM | POA: Diagnosis not present

## 2021-04-29 DIAGNOSIS — R809 Proteinuria, unspecified: Secondary | ICD-10-CM

## 2021-04-29 DIAGNOSIS — R5383 Other fatigue: Secondary | ICD-10-CM | POA: Diagnosis not present

## 2021-04-29 DIAGNOSIS — I10 Essential (primary) hypertension: Secondary | ICD-10-CM

## 2021-04-29 LAB — POCT GLYCOSYLATED HEMOGLOBIN (HGB A1C): Hemoglobin A1C: 4.8 % (ref 4.0–5.6)

## 2021-04-29 MED ORDER — ESCITALOPRAM OXALATE 10 MG PO TABS: 10.0000 mg | ORAL_TABLET | Freq: Every day | ORAL | 0 refills | Status: DC

## 2021-04-29 MED ORDER — BLOOD GLUCOSE METER KIT: PACK | 0 refills | Status: DC

## 2021-04-29 MED ORDER — GABAPENTIN 300 MG PO CAPS: 600.0000 mg | ORAL_CAPSULE | Freq: Two times a day (BID) | ORAL | 1 refills | Status: DC

## 2021-04-29 MED ORDER — TRESIBA 100 UNIT/ML ~~LOC~~ SOLN: 12.0000 [IU] | Freq: Every day | SUBCUTANEOUS | 0 refills | Status: DC

## 2021-04-29 MED ORDER — LOSARTAN POTASSIUM 25 MG PO TABS: 25.0000 mg | ORAL_TABLET | Freq: Every day | ORAL | 0 refills | Status: DC

## 2021-04-29 NOTE — Patient Instructions (Addendum)
Stop [employer] : start at 12 units and go up by 2 units every 3 days or down every 3 days to keep glucose between 100-140 fasting  Continue Synjardi for now   Glucerna or premier supplementation

## 2021-04-29 NOTE — Progress Notes (Signed)
Pt here after having an appt with dr. Hyman Hopes to see if she was a surgical candidate. And make plan for her cancer treatment

## 2021-04-30 ENCOUNTER — Telehealth: Payer: Self-pay | Admitting: *Deleted

## 2021-04-30 NOTE — Chronic Care Management (AMB) (Signed)
  Chronic Care Management   Note  04/30/2021 Name: Janice Short MRN: [id number] DOB: 09-20-1961  Janice Short is a 60 y.o. year old female who is a primary care patient of Steele Sizer, MD. I reached out to [name] by phone today in response to a referral sent by [name]'s PCP, Steele Sizer, MD.     [name] was given information about Chronic Care Management services today including:  1. CCM service includes personalized support from designated clinical staff supervised by her physician, including individualized plan of care and coordination with other care providers 2. 24/7 contact phone numbers for assistance for urgent and routine care needs. 3. Service will only be billed when office clinical staff spend 20 minutes or more in a month to coordinate care. 4. Only one practitioner may furnish and bill the service in a calendar month. 5. The patient may stop CCM services at any time (effective at the end of the month) by phone call to the office staff. 6. The patient will be responsible for cost sharing (co-pay) of up to 20% of the service fee (after annual deductible is met).  Patient agreed to services and verbal consent obtained.   Follow up plan: Telephone appointment with care management team member scheduled for:  Licensed Clinical SW 05/02/2021 PharmD 05/08/2021  [facility]  Direct Dial [phone]

## 2021-05-01 ENCOUNTER — Telehealth: Payer: Self-pay | Admitting: *Deleted

## 2021-05-01 ENCOUNTER — Telehealth: Payer: Self-pay | Admitting: Family Medicine

## 2021-05-01 NOTE — Telephone Encounter (Signed)
Pt decline to sch for covid vaccine at this time

## 2021-05-01 NOTE — Telephone Encounter (Signed)
Called pt to see if she had decided if she wants to start the chemo of gemzar and cisplatin. She wants to know if she is going to loose her hair. I told her that most people do not loose their hair- it is very low  And when I spoke to [facility] in infusion and she says she has seen hair thinning. The pt. Needs to have instructions for colonoscopy- she lost her list. I have printed it for her and she will come and get in tom. She also wants me to print out info with chemo. It will be left downstairs at register desk

## 2021-05-02 ENCOUNTER — Telehealth: Payer: Medicare HMO

## 2021-05-02 ENCOUNTER — Ambulatory Visit (INDEPENDENT_AMBULATORY_CARE_PROVIDER_SITE_OTHER): Payer: Medicare HMO | Admitting: *Deleted

## 2021-05-02 DIAGNOSIS — F331 Major depressive disorder, recurrent, moderate: Secondary | ICD-10-CM

## 2021-05-02 DIAGNOSIS — E118 Type 2 diabetes mellitus with unspecified complications: Secondary | ICD-10-CM | POA: Diagnosis not present

## 2021-05-02 DIAGNOSIS — R69 Illness, unspecified: Secondary | ICD-10-CM | POA: Diagnosis not present

## 2021-05-02 DIAGNOSIS — C221 Intrahepatic bile duct carcinoma: Secondary | ICD-10-CM | POA: Diagnosis not present

## 2021-05-02 NOTE — Patient Instructions (Addendum)
Visit Information Visit Information  PATIENT GOALS:  Goals Addressed            This Visit's Progress   . Manage My Emotions       Timeframe:  Long-Range Goal Priority:  Medium Start Date:    05/02/21                         Expected End Date:  12/02/21                   Follow Up Date 05/16/21   - begin personal counseling - call and visit an old friend - practice relaxation or meditation daily - talk about feelings with a friend, family or spiritual advisor - practice positive thinking and self-talk    Why is this important?    When you are stressed, down or upset, your body reacts too.   For example, your blood pressure may get higher; you may have a headache or stomachache.   When your emotions get the best of you, your body's ability to fight off cold and flu gets weak.   These steps will help you manage your emotions.     Notes:        Janice Short was given information about Care Management services by the embedded care coordination team including:  1. Care Management services include personalized support from designated clinical staff supervised by her physician, including individualized plan of care and coordination with other care providers 2. 24/7 contact phone numbers for assistance for urgent and routine care needs. 3. The patient may stop CCM services at any time (effective at the end of the month) by phone call to the office staff.  Patient agreed to services and verbal consent obtained.   The patient verbalized understanding of instructions, educational materials, and care plan provided today and declined offer to receive copy of patient instructions, educational materials, and care plan.   Telephone follow up appointment with care management team member scheduled for: 05/16/21  Elliot Gurney, [place] Worker  [facility] Care Management [phone]

## 2021-05-02 NOTE — Chronic Care Management (AMB) (Signed)
Chronic Care Management    Clinical Social Work Note  05/02/2021 Name: Janice Short MRN: [id number] DOB: Apr 17, 1961  Janice Short is a 60 y.o. year old female who is a primary care patient of Steele Sizer, MD. The CCM team was consulted to assist the patient with chronic disease management and/or care coordination needs related to: [place] and Resources.   Engaged with patient by telephone for initial visit in response to provider referral for social work chronic care management and care coordination services.   Consent to Services:  The patient was given the following information about Chronic Care Management services today, agreed to services, and gave verbal consent: 1. CCM service includes personalized support from designated clinical staff supervised by the primary care provider, including individualized plan of care and coordination with other care providers 2. 24/7 contact phone numbers for assistance for urgent and routine care needs. 3. Service will only be billed when office clinical staff spend 20 minutes or more in a month to coordinate care. 4. Only one practitioner may furnish and bill the service in a calendar month. 5.The patient may stop CCM services at any time (effective at the end of the month) by phone call to the office staff. 6. The patient will be responsible for cost sharing (co-pay) of up to 20% of the service fee (after annual deductible is met). Patient agreed to services and consent obtained.  Patient agreed to services and consent obtained.   Assessment: Review of patient past medical history, allergies, medications, and health status, including review of relevant consultants reports was performed today as part of a comprehensive evaluation and provision of chronic care management and care coordination services.     SDOH (Social Determinants of Health) assessments and interventions performed:  SDOH Interventions   Flowsheet Row Most Recent Value   SDOH Interventions   SDOH Interventions for the Following Domains Depression  Depression Interventions/Treatment  Counseling, Medication       Advanced Directives Status: Not addressed in this encounter.  CCM Care Plan  Allergies  Allergen Reactions  . Other Shortness Of Breath    Cat dander and grass pollen  . Bydureon [Exenatide] Other (See Comments)    Pain with injection not allergy    Outpatient Encounter Medications as of 05/02/2021  Medication Sig  . albuterol (VENTOLIN HFA) 108 (90 Base) MCG/ACT inhaler INHALE 2 PUFFS BY MOUTH EVERY 6 HOURS AS NEEDED FOR WHEEZING AND FOR SHORTNESS OF BREATH  . aspirin 81 MG tablet Take 1 tablet by mouth daily.  Marland Kitchen atorvastatin (LIPITOR) 40 MG tablet Take 1 tablet (40 mg total) by mouth at bedtime.  . blood glucose meter kit and supplies Dispense based on patient and insurance preference. Use up to four times daily as directed. (FOR ICD-10 E10.9, E11.9).  Marland Kitchen diphenhydramine-acetaminophen (TYLENOL PM) 25-500 MG TABS tablet Take 1 tablet by mouth at bedtime as needed.  . Empagliflozin-metFORMIN HCl ER (SYNJARDY XR) 12.04-999 MG TB24 Take 2 tablets by mouth daily. New dose  . escitalopram (LEXAPRO) 10 MG tablet Take 1 tablet (10 mg total) by mouth daily.  . fluticasone (FLONASE) 50 MCG/ACT nasal spray USE 2 SPRAY(S) IN EACH NOSTRIL AT BEDTIME (Patient taking differently: Place 2 sprays into both nostrils daily as needed. USE 2 SPRAY(S) IN EACH NOSTRIL AT BEDTIME)  . Fluticasone-Umeclidin-Vilant (TRELEGY ELLIPTA) 100-62.5-25 MCG/INH AEPB Take 1 puff by mouth daily.  Marland Kitchen gabapentin (NEURONTIN) 300 MG capsule Take 2 capsules (600 mg total) by mouth 2 (two) times  daily.  . Insulin Degludec (TRESIBA) 100 UNIT/ML SOLN Inject 12-30 units of lipase into the skin daily. To keep fasting glucose between 100-140  . Insulin Pen Needle 31G X 8 MM MISC 1 each by Does not apply route daily.  Marland Kitchen losartan (COZAAR) 25 MG tablet Take 1 tablet (25 mg total) by mouth  daily.  . Na Sulfate-K Sulfate-Mg Sulf (SUPREP BOWEL PREP KIT) 17.5-3.13-1.6 GM/177ML SOLN Take 1 kit by mouth as directed.   No facility-administered encounter medications on file as of 05/02/2021.    Patient Active Problem List   Diagnosis Date Noted  . Intrahepatic cholangiocarcinoma ([place]) 04/12/2021  . Goals of care, counseling/discussion 04/12/2021  . Mild episode of recurrent major depressive disorder ([place]) 02/08/2019  . Dyslipidemia associated with type 2 diabetes mellitus ([place]) 12/17/2017  . Noncompliance with medication regimen 12/17/2017  . Spinal stenosis 10/15/2016  . Diabetic polyneuropathy associated with type 2 diabetes mellitus ([place]) 07/14/2016  . Chronic midline low back pain with sciatica 06/22/2016  . Primary osteoarthritis of both knees 06/22/2016  . Carpal tunnel syndrome 07/01/2015  . Cervical radiculitis 07/01/2015  . Corn or callus 07/01/2015  . Impingement syndrome of shoulder 07/01/2015  . Microalbuminuria 07/01/2015  . Compulsive tobacco user syndrome 07/01/2015  . Tenosynovitis of wrist 07/01/2015  . Benign hypertension 11/05/2009  . Uncontrolled type 2 diabetes mellitus with microalbuminuria ([place]) 11/05/2009  . Athlete's foot 08/05/2009  . Combined fat and carbohydrate induced hyperlipemia 01/08/2009  . Vitamin D deficiency 01/08/2009  . Asthma, mild intermittent 12/28/2008  . Allergic rhinitis 12/28/2008  . Lumbosacral neuritis 08/11/2007    Conditions to be addressed/monitored: Depression; Mental Health Concerns   Care Plan : Depression (Adult)  Updates made by Vern Claude, LCSW since 05/02/2021 12:00 AM    Problem: Symptoms (Depression)     Long-Range Goal: Symptoms Monitored and Managed   Start Date: 05/02/2021  Expected End Date: 12/02/2021  This Visit's Progress: On track  Priority: Medium  Note:   Current Barriers:  . Acute Mental Health needs related to recent diagnosis . Mental Health Concerns  . Suicidal Ideation/Homicidal  Ideation: No  Clinical Social Work Goal(s):  Marland Kitchen Over the next 90 days, patient will work with SW bi-weekly by telephone or in person to reduce or manage symptoms related to depression . patient will work with SW to address concerns related to depressed mood due to recent cancer diagnosis  Interventions: . Patient interviewed and appropriate assessments performed: PHQ 2 . PHQ 9 . SDOH Interventions  .   Marland Kitchen Flowsheet Row . Most Recent Value .  SDOH Interventions .  Marland Kitchen  SDOH Interventions for the Following Domains . Depression .  Depression Interventions/Treatment  . Counseling, Medication .   .  .  Patient discussed recent cancer diagnosis and adjustment difficulties . Patient describes a positive support network of family and friends . Patient encouraged to verbalize her feeling regarding recent diagnosis, emotional support provided . Self care emphasized, positive coping discussed and reinforced including consideration of long term mental health counseling . Discussed plans with patient for ongoing care management follow up and provided patient with direct contact information for care management team   Patient Self Care Activities:  . Performs ADL's independently . Performs IADL's independently . Ability for insight . Strong family or social support  Patient Coping Strengths:  . Supportive Relationships . Family . Friends . Able to Communicate Effectively  Patient Self Care Deficits:  . Coping struggles related to recent diagnosis  Task: Alleviate Barriers to Depression Treatment        Follow Up Plan: SW will follow up with patient by phone over the next 14 business days      [place], [place] Worker  [facility] Care Management [phone]

## 2021-05-03 NOTE — Progress Notes (Signed)
Hematology/Oncology Consult note [facility]  Telephone:([phone] Fax:([phone]) [phone]  Patient Care Team: Steele Sizer, MD as PCP - General (Family Medicine) Neldon Labella, RN as Case Manager Clent Jacks, RN as Oncology Nurse Navigator [place], [place], LCSW as Social Worker Germaine [place], [facility] (Pharmacist)   Name of the patient: Janice Short  [id number]  [birth year]   Date of visit: 05/03/21  Diagnosis-stage IV cholangiocarcinoma  Chief complaint/ Reason for visit-discuss further management of cholangiocarcinoma  Heme/Onc history: Patient is a 60 year old African-American female with a past medical history significant for hypertension hyperlipidemia and type 2 diabetes who presented to the ER with symptoms of cramping abdominal pain and nausea. This was followed by right upper quadrant ultrasound which showed multiple echogenic masses in the liver with nodular contour of the liver possibly suggestive of cirrhosis. Several nodular masses in the vicinity of the pancreatic head and porta hepatic lymph nodes. This was followed by an MRI abdomen and MRCP. It showed right hepatic lobe masslike area measuring 4.9 x 2.9 cm. Diffusion abnormality in the left hepatic lobe with mild left-sided biliary ductal dilatation and another area of abnormality measuring 1.4 x 1 cm. Bulky celiac adenopathy 3 x 2.1 cm tracking into gastrohepatic recess. Suspected portal venous invasion with convex protrusion in the right portal vein. Left adrenal lesion measuring 2.9 x 1.5 cm. Findings concerning for cholangiocarcinoma or biphenotypic cholangiole hepatocellular neoplasm.  Patient underwent CT-guided liver biopsyWhich was consistent with adenocarcinoma poorly differentiated. Immunohistochemistry showed CK7 positivity, CDX2 negative, Heppar negative, GATA3 negative. CK19 positive. Differentials include cholangiocarcinoma, pancreatic, upper GI. Lung and  breast less likely. However given CK19 positivity most compatible with cholangiocarcinoma.  Patient's case was discussed at tumor board and it was felt as if the lymphadenopathy was more like a confluent primary tumor mass.  Patient was referred to [facility] for second opinion to see if she would be a candidate for surgery.However upon their review of the PET scan it was deemed that this was indeed periportal adenopathy.  They also did a CT chest abdomen and pelvis with MIPS protocol and she was also found to have portocaval lymph node measuring 2.4 cm which was more consistent with metastatic disease.  She was deemed unresectable  NGS testing showed BRCA2, FGF R2, PIK3CA, RAD 54L, CDC73, CDK N2 A/B CR EBBP rearrangement exon 31   Interval history-patient is overwhelmed with recent developments.  She is seeing Dr. Allen Norris EGD and colonoscopy soon and does not want to proceed with any treatment until she gets done with that.  Denies any significant pain at this time  ECOG PS- 1 Pain scale- 0   Review of systems- Review of Systems  Constitutional: Positive for malaise/fatigue. Negative for chills, fever and weight loss.  HENT: Negative for congestion, ear discharge and nosebleeds.   Eyes: Negative for blurred vision.  Respiratory: Negative for cough, hemoptysis, sputum production, shortness of breath and wheezing.   Cardiovascular: Negative for chest pain, palpitations, orthopnea and claudication.  Gastrointestinal: Negative for abdominal pain, blood in stool, constipation, diarrhea, heartburn, melena, nausea and vomiting.  Genitourinary: Negative for dysuria, flank pain, frequency, hematuria and urgency.  Musculoskeletal: Negative for back pain, joint pain and myalgias.  Skin: Negative for rash.  Neurological: Negative for dizziness, tingling, focal weakness, seizures, weakness and headaches.  Endo/Heme/Allergies: Does not bruise/bleed easily.  Psychiatric/Behavioral: Negative for depression and  suicidal ideas. The patient does not have insomnia.      Allergies  Allergen Reactions  .  Other Shortness Of Breath    Cat dander and grass pollen  . Bydureon [Exenatide] Other (See Comments)    Pain with injection not allergy     Past Medical History:  Diagnosis Date  . Allergy   . Asthma   . Carpal tunnel syndrome on left   . Cervical radiculitis   . Chronic osteoarthritis   . Corns and callosity   . Depression   . Dermatophytosis of foot   . Diabetes mellitus without complication ([place])   . Gout   . Hyperlipidemia   . Hypertension   . Impingement syndrome of left shoulder   . Intrahepatic cholangiocarcinoma ([place])   . Lumbosacral neuritis   . Microalbuminuria   . Obesity   . Supraventricular tachycardia ([place]) 07/01/2015  . SVT (supraventricular tachycardia) ([place])   . Tenosynovitis of wrist   . Vitamin D deficiency      Past Surgical History:  Procedure Laterality Date  . ABDOMINAL HYSTERECTOMY      Social History   Socioeconomic History  . Marital status: Single    Spouse name: Not on file  . Number of children: 2  . Years of education: Not on file  . Highest education level: Not on file  Occupational History  . Occupation: disable     Comment: from chronic back pain, 2019  Tobacco Use  . Smoking status: Current Some Day Smoker    Packs/day: 0.25    Types: Cigarettes  . Smokeless tobacco: Never Used  . Tobacco comment: 2 per day 01/30/21  Vaping Use  . Vaping Use: Never used  Substance and Sexual Activity  . Alcohol use: Yes    Alcohol/week: 0.0 standard drinks    Comment: rare maybe 1-2  year  . Drug use: No  . Sexual activity: Yes  Other Topics Concern  . Not on file  Social History Narrative   She is on disability for chronic back pain. Pt's son lives with her   Social Determinants of Health   Financial Resource Strain: Medium Risk  . Difficulty of Paying Living Expenses: Somewhat hard  Food Insecurity: No Food Insecurity  . Worried  About Charity fundraiser in the Last Year: Never true  . Ran Out of Food in the Last Year: Never true  Transportation Needs: No Transportation Needs  . Lack of Transportation (Medical): No  . Lack of Transportation (Non-Medical): No  Physical Activity: Inactive  . Days of Exercise per Week: 0 days  . Minutes of Exercise per Session: 0 min  Stress: Stress Concern Present  . Feeling of Stress : To some extent  Social Connections: Moderately Isolated  . Frequency of Communication with Friends and Family: More than three times a week  . Frequency of Social Gatherings with Friends and Family: Three times a week  . Attends Religious Services: More than 4 times per year  . Active Member of Clubs or Organizations: No  . Attends Archivist Meetings: Never  . Marital Status: Never married  Intimate Partner Violence: Not At Risk  . Fear of Current or Ex-Partner: No  . Emotionally Abused: No  . Physically Abused: No  . Sexually Abused: No    Family History  Problem Relation Age of Onset  . Heart attack Mother   . CAD Mother   . Kidney disease Father      Current Outpatient Medications:  .  albuterol (VENTOLIN HFA) 108 (90 Base) MCG/ACT inhaler, INHALE 2 PUFFS BY MOUTH EVERY 6 HOURS  AS NEEDED FOR WHEEZING AND FOR SHORTNESS OF BREATH, Disp: 18 g, Rfl: 1 .  aspirin 81 MG tablet, Take 1 tablet by mouth daily., Disp: , Rfl:  .  atorvastatin (LIPITOR) 40 MG tablet, Take 1 tablet (40 mg total) by mouth at bedtime., Disp: 90 tablet, Rfl: 1 .  diphenhydramine-acetaminophen (TYLENOL PM) 25-500 MG TABS tablet, Take 1 tablet by mouth at bedtime as needed., Disp: , Rfl:  .  Empagliflozin-metFORMIN HCl ER (SYNJARDY XR) 12.04-999 MG TB24, Take 2 tablets by mouth daily. New dose, Disp: 180 tablet, Rfl: 1 .  fluticasone (FLONASE) 50 MCG/ACT nasal spray, USE 2 SPRAY(S) IN EACH NOSTRIL AT BEDTIME (Patient taking differently: Place 2 sprays into both nostrils daily as needed. USE 2 SPRAY(S) IN EACH  NOSTRIL AT BEDTIME), Disp: 16 g, Rfl: 0 .  Fluticasone-Umeclidin-Vilant (TRELEGY ELLIPTA) 100-62.5-25 MCG/INH AEPB, Take 1 puff by mouth daily., Disp: 180 each, Rfl: 1 .  Insulin Pen Needle 31G X 8 MM MISC, 1 each by Does not apply route daily., Disp: 100 each, Rfl: 1 .  Na Sulfate-K Sulfate-Mg Sulf (SUPREP BOWEL PREP KIT) 17.5-3.13-1.6 GM/177ML SOLN, Take 1 kit by mouth as directed., Disp: 354 mL, Rfl: 0 .  blood glucose meter kit and supplies, Dispense based on patient and insurance preference. Use up to four times daily as directed. (FOR ICD-10 E10.9, E11.9)., Disp: 1 each, Rfl: 0 .  escitalopram (LEXAPRO) 10 MG tablet, Take 1 tablet (10 mg total) by mouth daily., Disp: 90 tablet, Rfl: 0 .  gabapentin (NEURONTIN) 300 MG capsule, Take 2 capsules (600 mg total) by mouth 2 (two) times daily., Disp: 360 capsule, Rfl: 1 .  Insulin Degludec (TRESIBA) 100 UNIT/ML SOLN, Inject 12-30 units of lipase into the skin daily. To keep fasting glucose between 100-140, Disp: 15 mL, Rfl: 0 .  losartan (COZAAR) 25 MG tablet, Take 1 tablet (25 mg total) by mouth daily., Disp: 90 tablet, Rfl: 0  Physical exam:  Vitals:   04/29/21 1053  BP: 112/67  Pulse: 90  Resp: 16  Temp: 98.2 F (36.8 C)  TempSrc: Oral  Weight: 155 lb 11.2 oz (70.6 kg)  Height: $Remove'5\' 7"'FNqJHqI$  (1.702 m)   Physical Exam Eyes:     Comments: Mild scleral icterus  Cardiovascular:     Rate and Rhythm: Normal rate and regular rhythm.     Heart sounds: Normal heart sounds.  Pulmonary:     Effort: Pulmonary effort is normal.     Breath sounds: Normal breath sounds.  Abdominal:     General: Bowel sounds are normal.     Palpations: Abdomen is soft.  Skin:    General: Skin is warm and dry.  Neurological:     Mental Status: She is alert and oriented to person, place, and time.      CMP Latest Ref Rng & Units 04/21/2021  Glucose 70 - 99 mg/dL 138(H)  BUN 8 - 23 mg/dL 16  Creatinine 0.44 - 1.00 mg/dL 1.08(H)  Sodium 135 - 145 mmol/L 135   Potassium 3.5 - 5.1 mmol/L 3.7  Chloride 98 - 111 mmol/L 101  CO2 22 - 32 mmol/L 23  Calcium 8.9 - 10.3 mg/dL 9.0  Total Protein 6.5 - 8.1 g/dL 8.9(H)  Total Bilirubin 0.3 - 1.2 mg/dL 2.6(H)  Alkaline Phos 38 - 126 U/L 733(H)  AST 15 - 41 U/L 55(H)  ALT 0 - 44 U/L 62(H)   CBC Latest Ref Rng & Units 04/21/2021  WBC 4.0 - 10.5 K/uL 11.6(H)  Hemoglobin 12.0 - 15.0 g/dL 12.6  Hematocrit 36.0 - 46.0 % 37.7  Platelets 150 - 400 K/uL 321    No images are attached to the encounter.  NM PET Image Initial (PI) Skull Base To Thigh  Result Date: 04/17/2021 CLINICAL DATA:  Initial treatment strategy for poorly differentiated adenocarcinoma from recent liver biopsy. EXAM: NUCLEAR MEDICINE PET SKULL BASE TO THIGH TECHNIQUE: 8.46 mCi F-18 FDG was injected intravenously. Full-ring PET imaging was performed from the skull base to thigh after the radiotracer. CT data was obtained and used for attenuation correction and anatomic localization. Fasting blood glucose: 148 mg/dl COMPARISON:  MRI abdomen 03/24/2021 FINDINGS: Mediastinal blood pool activity: SUV max 2.58 Liver activity: SUV max NA NECK: No hypermetabolic lymph nodes in the neck. Incidental CT findings: Thyroid goiter without thyroid nodules. Opacification of the right maxillary sinus with thickened sinus walls likely due to chronic sinus disease or large mucous retention cyst. CHEST: No hypermetabolic mediastinal or hilar nodes. No suspicious pulmonary nodules on the CT scan. No hypermetabolic breast masses, supraclavicular or axillary adenopathy. Incidental CT findings: Scattered age advanced atherosclerotic calcifications involving the aorta and coronary arteries. ABDOMEN/PELVIS: Numerous hypermetabolic hepatic lesions as demonstrated on the recent MRI. Difficult to identify and measure the liver lesions on the noncontrast CT scan. The largest lesion in the right hepatic lobe has an SUV max of 6.56. Hypermetabolic periportal and hepatoduodenal  ligament lymphadenopathy is hypermetabolic with SUV max of 7.06. No obvious GI tract primary neoplasm is identified. Specifically, do not see any evidence of the colon cancer gastric lesion or pancreatic lesion. Incidental CT findings: Stable vascular calcifications. Stable cholelithiasis. SKELETON: No findings for osseous metastatic disease. Incidental CT findings: none IMPRESSION: 1. Hypermetabolic hepatic lesions correlating with the recent MRI. 2. Hypermetabolic periportal and hepatoduodenal ligament lymphadenopathy. 3. No obvious primary neoplasm is demonstrated in the chest, abdomen or pelvis. 4. No findings for osseous metastatic disease. Electronically Signed   By: Marijo Sanes M.D.   On: 04/17/2021 11:33     Assessment and plan- Patient is a 60 y.o. female with stage IV intrahepatic cholangiocarcinoma here to discuss further management  Liver biopsy although not entirely specific was overall clinically compatible with cholangiocarcinoma.  Given her clinical presentation she has a large intrahepatic mass as well as evidence of portocaval and local regional adenopathy.  She has not been deemed to be a candidate for surgery as the portocaval adenopathy is outside the field of surgery.  Discussed with the patient that we would treat her as such like stage IV disease and I would recommend palliative chemotherapy with gemcitabine and cisplatin.Treatment will be given 2 weeks on and 1 week off and I would recommend gemcitabine 800 mg per metered square along with split dose cisplatin at 35 mg per metered square.  Discussed risks and benefits of chemotherapy including all but not limited to nausea, vomiting, low blood counts, risk of infections and hospitalizations.  Risk of hair loss associated with chemotherapy.  Patient is extremely fearful of her hair loss.  Patient is presently overwhelmed with recent developments and would want to take her time to think about everything and get back to [place] after she is  done with EGD and colonoscopy.  Patient has presence of somatic BRCA2 mutation and will be a candidate for genetic testing as well but I will hold off on that discussion today.  Additionally patient has FGF R2 mutation on NGS testing which would make her a candidate for EGFR inhibitors upon first-line progression.  Patient does have elevated AST ALT alkaline phosphatase as well as total bilirubin of 2.6.  I have discussed her case with Dr. Allen Norris and she is not currently a candidate for any internal stenting which can also increase her risk of infection.  However if there is a consistent increase in her bilirubin PTCA is a consideration for her.   Visit Diagnosis 1. Goals of care, counseling/discussion   2. Intrahepatic cholangiocarcinoma ([place])      Dr. Randa Evens, MD, MPH [facility] [phone] 05/03/2021 1:27 PM

## 2021-05-06 ENCOUNTER — Encounter
Admission: RE | Disposition: A | Discharge status: Home / Self Care | Payer: Self-pay | Source: Home / Self Care | Attending: Gastroenterology

## 2021-05-06 ENCOUNTER — Ambulatory Visit: Payer: Medicare HMO | Admitting: Anesthesiology

## 2021-05-06 ENCOUNTER — Ambulatory Visit
Admission: RE | Admit: 2021-05-06 | Discharge: 2021-05-06 | Disposition: A | Discharge status: Home / Self Care | Payer: Medicare HMO | Attending: Gastroenterology | Admitting: Gastroenterology

## 2021-05-06 ENCOUNTER — Encounter: Payer: Self-pay | Admitting: Gastroenterology

## 2021-05-06 DIAGNOSIS — R933 Abnormal findings on diagnostic imaging of other parts of digestive tract: Secondary | ICD-10-CM

## 2021-05-06 DIAGNOSIS — E785 Hyperlipidemia, unspecified: Secondary | ICD-10-CM | POA: Diagnosis not present

## 2021-05-06 DIAGNOSIS — D125 Benign neoplasm of sigmoid colon: Secondary | ICD-10-CM | POA: Diagnosis not present

## 2021-05-06 DIAGNOSIS — Z7951 Long term (current) use of inhaled steroids: Secondary | ICD-10-CM | POA: Insufficient documentation

## 2021-05-06 DIAGNOSIS — Z7982 Long term (current) use of aspirin: Secondary | ICD-10-CM | POA: Diagnosis not present

## 2021-05-06 DIAGNOSIS — Z1211 Encounter for screening for malignant neoplasm of colon: Secondary | ICD-10-CM | POA: Diagnosis not present

## 2021-05-06 DIAGNOSIS — Z7984 Long term (current) use of oral hypoglycemic drugs: Secondary | ICD-10-CM | POA: Diagnosis not present

## 2021-05-06 DIAGNOSIS — K2971 Gastritis, unspecified, with bleeding: Secondary | ICD-10-CM | POA: Diagnosis not present

## 2021-05-06 DIAGNOSIS — F17210 Nicotine dependence, cigarettes, uncomplicated: Secondary | ICD-10-CM | POA: Diagnosis not present

## 2021-05-06 DIAGNOSIS — K317 Polyp of stomach and duodenum: Secondary | ICD-10-CM

## 2021-05-06 DIAGNOSIS — R69 Illness, unspecified: Secondary | ICD-10-CM | POA: Diagnosis not present

## 2021-05-06 DIAGNOSIS — K621 Rectal polyp: Secondary | ICD-10-CM

## 2021-05-06 DIAGNOSIS — D132 Benign neoplasm of duodenum: Secondary | ICD-10-CM | POA: Insufficient documentation

## 2021-05-06 DIAGNOSIS — Z794 Long term (current) use of insulin: Secondary | ICD-10-CM | POA: Diagnosis not present

## 2021-05-06 DIAGNOSIS — K635 Polyp of colon: Secondary | ICD-10-CM

## 2021-05-06 DIAGNOSIS — K2970 Gastritis, unspecified, without bleeding: Secondary | ICD-10-CM | POA: Diagnosis not present

## 2021-05-06 DIAGNOSIS — Z79899 Other long term (current) drug therapy: Secondary | ICD-10-CM | POA: Insufficient documentation

## 2021-05-06 DIAGNOSIS — C221 Intrahepatic bile duct carcinoma: Secondary | ICD-10-CM

## 2021-05-06 DIAGNOSIS — D128 Benign neoplasm of rectum: Secondary | ICD-10-CM | POA: Diagnosis not present

## 2021-05-06 HISTORY — PX: ESOPHAGOGASTRODUODENOSCOPY (EGD) WITH PROPOFOL: SHX5813

## 2021-05-06 HISTORY — PX: COLONOSCOPY WITH PROPOFOL: SHX5780

## 2021-05-06 LAB — GLUCOSE, CAPILLARY: Glucose-Capillary: 180 mg/dL — ABNORMAL HIGH (ref 70–99)

## 2021-05-06 SURGERY — COLONOSCOPY WITH PROPOFOL
Anesthesia: General

## 2021-05-06 MED ORDER — PROPOFOL 10 MG/ML IV BOLUS
INTRAVENOUS | Status: DC | PRN
  Administered 2021-05-06: 70 mg via INTRAVENOUS
  Administered 2021-05-06: 30 mg via INTRAVENOUS

## 2021-05-06 MED ORDER — PROPOFOL 500 MG/50ML IV EMUL
INTRAVENOUS | Status: DC | PRN
  Administered 2021-05-06: 120 ug/kg/min via INTRAVENOUS

## 2021-05-06 MED ORDER — GLYCOPYRROLATE 0.2 MG/ML IJ SOLN
INTRAMUSCULAR | Status: DC | PRN
  Administered 2021-05-06: .2 mg via INTRAVENOUS

## 2021-05-06 MED ORDER — GLYCOPYRROLATE 0.2 MG/ML IJ SOLN
INTRAMUSCULAR | Status: AC
  Filled 2021-05-06: qty 1

## 2021-05-06 MED ORDER — SODIUM CHLORIDE 0.9 % IV SOLN
INTRAVENOUS | Status: DC
  Administered 2021-05-06: 20 mL/h via INTRAVENOUS

## 2021-05-06 MED ORDER — MIDAZOLAM HCL 5 MG/5ML IJ SOLN
INTRAMUSCULAR | Status: DC | PRN
  Administered 2021-05-06: 2 mg via INTRAVENOUS

## 2021-05-06 MED ORDER — LIDOCAINE 2% (20 MG/ML) 5 ML SYRINGE
INTRAMUSCULAR | Status: DC | PRN
  Administered 2021-05-06: 25 mg via INTRAVENOUS

## 2021-05-06 MED ORDER — MIDAZOLAM HCL 2 MG/2ML IJ SOLN
INTRAMUSCULAR | Status: AC
  Filled 2021-05-06: qty 2

## 2021-05-06 NOTE — Transfer of Care (Signed)
Immediate Anesthesia Transfer of Care Note  Patient: Janice Short  Procedure(s) Performed: COLONOSCOPY WITH PROPOFOL (N/A ) ESOPHAGOGASTRODUODENOSCOPY (EGD) WITH PROPOFOL (N/A )  Patient Location: Endoscopy Unit  Anesthesia Type:General  Level of Consciousness: drowsy  Airway & Oxygen Therapy: Patient Spontanous Breathing  Post-op Assessment: Report given to RN and Post -op Vital signs reviewed and stable  Post vital signs: Reviewed  Last Vitals:  Vitals Value Taken Time  BP 94/66 05/06/21 0920  Temp    Pulse 94 05/06/21 0920  Resp 28 05/06/21 0920  SpO2 98 % 05/06/21 0920  Vitals shown include unvalidated device data.  Last Pain:  Vitals:   05/06/21 0734  TempSrc: Temporal         Complications: No complications documented.

## 2021-05-06 NOTE — H&P (Signed)
Midge Minium, MD [facility] [address], [address] [place], [place] [address] Phone:[phone] Fax : [phone]  Primary Care Physician:  Alba Cory, MD Primary Gastroenterologist:  Dr. Servando Snare  Pre-Procedure History & Physical: HPI:  Janice Short is a 60 y.o. female is here for an endoscopy and colonoscopy.   Past Medical History:  Diagnosis Date  . Allergy   . Asthma   . Carpal tunnel syndrome on left   . Cervical radiculitis   . Chronic osteoarthritis   . Corns and callosity   . Depression   . Dermatophytosis of foot   . Diabetes mellitus without complication (HCC)   . Gout   . Hyperlipidemia   . Hypertension   . Impingement syndrome of left shoulder   . Intrahepatic cholangiocarcinoma (HCC)   . Lumbosacral neuritis   . Microalbuminuria   . Obesity   . Supraventricular tachycardia (HCC) 07/01/2015  . SVT (supraventricular tachycardia) (HCC)   . Tenosynovitis of wrist   . Vitamin D deficiency     Past Surgical History:  Procedure Laterality Date  . ABDOMINAL HYSTERECTOMY      Prior to Admission medications   Medication Sig Start Date End Date Taking? Authorizing Provider  albuterol (VENTOLIN HFA) 108 (90 Base) MCG/ACT inhaler INHALE 2 PUFFS BY MOUTH EVERY 6 HOURS AS NEEDED FOR WHEEZING AND FOR SHORTNESS OF BREATH 02/15/20  Yes Sowles, Danna Hefty, MD  aspirin 81 MG tablet Take 1 tablet by mouth daily. 11/05/09  Yes [provider]  atorvastatin (LIPITOR) 40 MG tablet Take 1 tablet (40 mg total) by mouth at bedtime. 12/26/20  Yes Alba Cory, MD  blood glucose meter kit and supplies Dispense based on patient and insurance preference. Use up to four times daily as directed. (FOR ICD-10 E10.9, E11.9). 04/29/21  Yes Sowles, Danna Hefty, MD  diphenhydramine-acetaminophen (TYLENOL PM) 25-500 MG TABS tablet Take 1 tablet by mouth at bedtime as needed.   Yes [provider]  Empagliflozin-metFORMIN HCl ER (SYNJARDY XR) 12.04-999 MG TB24 Take 2 tablets by mouth  daily. New dose 12/26/20  Yes Sowles, Danna Hefty, MD  escitalopram (LEXAPRO) 10 MG tablet Take 1 tablet (10 mg total) by mouth daily. 04/29/21  Yes Sowles, Danna Hefty, MD  fluticasone (FLONASE) 50 MCG/ACT nasal spray USE 2 SPRAY(S) IN EACH NOSTRIL AT BEDTIME Patient taking differently: Place 2 sprays into both nostrils daily as needed. USE 2 SPRAY(S) IN EACH NOSTRIL AT BEDTIME 02/19/21  Yes Sowles, Danna Hefty, MD  Fluticasone-Umeclidin-Vilant (TRELEGY ELLIPTA) 100-62.5-25 MCG/INH AEPB Take 1 puff by mouth daily. 12/26/20  Yes Sowles, Danna Hefty, MD  gabapentin (NEURONTIN) 300 MG capsule Take 2 capsules (600 mg total) by mouth 2 (two) times daily. 04/29/21  Yes Sowles, Danna Hefty, MD  Insulin Degludec (TRESIBA) 100 UNIT/ML SOLN Inject 12-30 units of lipase into the skin daily. To keep fasting glucose between 100-140 04/29/21  Yes Sowles, Danna Hefty, MD  Insulin Pen Needle 31G X 8 MM MISC 1 each by Does not apply route daily. 12/26/20  Yes Sowles, Danna Hefty, MD  losartan (COZAAR) 25 MG tablet Take 1 tablet (25 mg total) by mouth daily. 04/29/21  Yes Sowles, Danna Hefty, MD  Na Sulfate-K Sulfate-Mg Sulf (SUPREP BOWEL PREP KIT) 17.5-3.13-1.6 GM/177ML SOLN Take 1 kit by mouth as directed. 04/21/21  Yes Midge Minium, MD    Allergies as of 04/21/2021 - Review Complete 04/21/2021  Allergen Reaction Noted  . Other Shortness Of Breath 09/01/2016  . Bydureon [exenatide] Other (See Comments) 06/18/2017    Family History  Problem Relation Age of  Onset  . Heart attack Mother   . CAD Mother   . Kidney disease Father     Social History   Socioeconomic History  . Marital status: Single    Spouse name: Not on file  . Number of children: 2  . Years of education: Not on file  . Highest education level: Not on file  Occupational History  . Occupation: disable     Comment: from chronic back pain, 2019  Tobacco Use  . Smoking status: Current Some Day Smoker    Packs/day: 0.25    Types: Cigarettes  . Smokeless tobacco: Never Used   . Tobacco comment: 2 per day 01/30/21  Vaping Use  . Vaping Use: Never used  Substance and Sexual Activity  . Alcohol use: Yes    Alcohol/week: 0.0 standard drinks    Comment: rare maybe 1-2  year  . Drug use: No  . Sexual activity: Yes  Other Topics Concern  . Not on file  Social History Narrative   She is on disability for chronic back pain. Pt's son lives with her   Social Determinants of Health   Financial Resource Strain: Medium Risk  . Difficulty of Paying Living Expenses: Somewhat hard  Food Insecurity: No Food Insecurity  . Worried About Charity fundraiser in the Last Year: Never true  . Ran Out of Food in the Last Year: Never true  Transportation Needs: No Transportation Needs  . Lack of Transportation (Medical): No  . Lack of Transportation (Non-Medical): No  Physical Activity: Inactive  . Days of Exercise per Week: 0 days  . Minutes of Exercise per Session: 0 min  Stress: Stress Concern Present  . Feeling of Stress : To some extent  Social Connections: Moderately Isolated  . Frequency of Communication with Friends and Family: More than three times a week  . Frequency of Social Gatherings with Friends and Family: Three times a week  . Attends Religious Services: More than 4 times per year  . Active Member of Clubs or Organizations: No  . Attends Archivist Meetings: Never  . Marital Status: Never married  Intimate Partner Violence: Not At Risk  . Fear of Current or Ex-Partner: No  . Emotionally Abused: No  . Physically Abused: No  . Sexually Abused: No    Review of Systems: See HPI, otherwise negative ROS  Physical Exam: BP 123/74   Pulse 97   Temp (!) 97.4 F (36.3 C) (Temporal)   Resp 20   Ht $R'5\' 7"'mm$  (1.702 m)   Wt 70.3 kg   SpO2 100%   BMI 24.28 kg/m  General:   Alert,  pleasant and cooperative in NAD Head:  Normocephalic and atraumatic. Neck:  Supple; no masses or thyromegaly. Lungs:  Clear throughout to auscultation.    Heart:   Regular rate and rhythm. Abdomen:  Soft, nontender and nondistended. Normal bowel sounds, without guarding, and without rebound.   Neurologic:  Alert and  oriented x4;  grossly normal neurologically.  Impression/Plan: Janice Short is here for an endoscopy and colonoscopy to be performed for liver mass   Risks, benefits, limitations, and alternatives regarding  endoscopy and colonoscopy have been reviewed with the patient.  Questions have been answered.  All parties agreeable.   Lucilla Lame, MD  05/06/2021, 8:29 AM

## 2021-05-06 NOTE — Anesthesia Postprocedure Evaluation (Signed)
Anesthesia Post Note  Patient: YAILYN STRACK  Procedure(s) Performed: COLONOSCOPY WITH PROPOFOL (N/A ) ESOPHAGOGASTRODUODENOSCOPY (EGD) WITH PROPOFOL (N/A )  Patient location during evaluation: Endoscopy Anesthesia Type: General Level of consciousness: awake and alert Pain management: pain level controlled Vital Signs Assessment: post-procedure vital signs reviewed and stable Respiratory status: spontaneous breathing, nonlabored ventilation, respiratory function stable and patient connected to nasal cannula oxygen Cardiovascular status: blood pressure returned to baseline and stable Postop Assessment: no apparent nausea or vomiting Anesthetic complications: no   No complications documented.   Last Vitals:  Vitals:   05/06/21 0734 05/06/21 0920  BP: 123/74   Pulse: 97 96  Resp: 20   Temp: (!) 36.3 C (!) 36.2 C  SpO2: 100%     Last Pain:  Vitals:   05/06/21 0950  TempSrc:   PainSc: 0-No pain                 Precious Haws Caera Enwright

## 2021-05-06 NOTE — Op Note (Signed)
[facility] Patient Name: Janice Short Procedure Date: 05/06/2021 8:33 AM MRN: [id number] Account #: [id number] Date of Birth: 1961/02/08 Admit Type: Outpatient Age: 60 Room: [facility] ENDO ROOM 4 Gender: Female Note Status: Finalized Procedure:             Colonoscopy Indications:           Screening for colorectal malignant neoplasm Providers:             Lucilla Lame MD, MD Medicines:             Propofol per Anesthesia Complications:         No immediate complications. Procedure:             Pre-Anesthesia Assessment:                        - Prior to the procedure, a History and Physical was                         performed, and patient medications and allergies were                         reviewed. The patient's tolerance of previous                         anesthesia was also reviewed. The risks and benefits                         of the procedure and the sedation options and risks                         were discussed with the patient. All questions were                         answered, and informed consent was obtained. Prior                         Anticoagulants: The patient has taken no previous                         anticoagulant or antiplatelet agents. ASA Grade                         Assessment: III - A patient with severe systemic                         disease. After reviewing the risks and benefits, the                         patient was deemed in satisfactory condition to                         undergo the procedure.                        After obtaining informed consent, the colonoscope was                         passed under direct vision. Throughout the procedure,  the patient's blood pressure, pulse, and oxygen                         saturations were monitored continuously. The                         Colonoscope was introduced through the anus and                         advanced to the the  cecum, identified by appendiceal                         orifice and ileocecal valve. The colonoscopy was                         performed without difficulty. The patient tolerated                         the procedure well. The quality of the bowel                         preparation was excellent. Findings:      The perianal and digital rectal examinations were normal.      A 5 mm polyp was found in the rectum. The polyp was sessile. The polyp       was removed with a cold snare. Resection and retrieval were complete.      Two sessile polyps were found in the sigmoid colon. The polyps were 1 to       5 mm in size. These polyps were removed with a cold snare. Resection and       retrieval were complete. Impression:            - One 5 mm polyp in the rectum, removed with a cold                         snare. Resected and retrieved.                        - Two 1 to 5 mm polyps in the sigmoid colon, removed                         with a cold snare. Resected and retrieved. Recommendation:        - Discharge patient to home.                        - Resume previous diet.                        - Continue present medications.                        - Repeat colonoscopy in 5 years for surveillance if                         adenomatous and 10 years if hyperplastic. Procedure Code(s):     --- Professional ---                        [phone], Colonoscopy,  flexible; with removal of                         tumor(s), polyp(s), or other lesion(s) by snare                         technique Diagnosis Code(s):     --- Professional ---                        Z12.11, Encounter for screening for malignant neoplasm                         of colon                        K62.1, Rectal polyp                        K63.5, Polyp of colon CPT copyright 2019 American Medical Association. All rights reserved. The codes documented in this report are preliminary and upon coder review may  be revised to meet  current compliance requirements. Lucilla Lame MD, MD 05/06/2021 9:16:49 AM This report has been signed electronically. Number of Addenda: 0 Note Initiated On: 05/06/2021 8:33 AM Scope Withdrawal Time: 0 hours 7 minutes 24 seconds  Total Procedure Duration: 0 hours 15 minutes 4 seconds  Estimated Blood Loss:  Estimated blood loss: none.      [facility]

## 2021-05-06 NOTE — Anesthesia Preprocedure Evaluation (Signed)
Anesthesia Evaluation  Patient identified by MRN, date of birth, ID band Patient awake    Reviewed: Allergy & Precautions, NPO status , Patient's Chart, lab work & pertinent test results  History of Anesthesia Complications Negative for: history of anesthetic complications  Airway Mallampati: III  TM Distance: >3 FB Neck ROM: full    Dental  (+) Chipped, Poor Dentition, Missing   Pulmonary asthma , COPD, Current Smoker and Patient abstained from smoking.,    Pulmonary exam normal        Cardiovascular Exercise Tolerance: Good hypertension, (-) angina(-) Past MI and (-) DOE Normal cardiovascular exam     Neuro/Psych PSYCHIATRIC DISORDERS  Neuromuscular disease    GI/Hepatic negative GI ROS, Neg liver ROS,   Endo/Other  diabetes, Type 2, Insulin Dependent  Renal/GU negative Renal ROS  negative genitourinary   Musculoskeletal  (+) Arthritis ,   Abdominal   Peds  Hematology negative hematology ROS (+)   Anesthesia Other Findings Past Medical History: No date: Allergy No date: Asthma No date: Carpal tunnel syndrome on left No date: Cervical radiculitis No date: Chronic osteoarthritis No date: Corns and callosity No date: Depression No date: Dermatophytosis of foot No date: Diabetes mellitus without complication (HCC) No date: Gout No date: Hyperlipidemia No date: Hypertension No date: Impingement syndrome of left shoulder No date: Intrahepatic cholangiocarcinoma (HCC) No date: Lumbosacral neuritis No date: Microalbuminuria No date: Obesity 07/01/2015: Supraventricular tachycardia (HCC) No date: SVT (supraventricular tachycardia) (HCC) No date: Tenosynovitis of wrist No date: Vitamin D deficiency  Past Surgical History: No date: ABDOMINAL HYSTERECTOMY  BMI    Body Mass Index: 24.28 kg/m      Reproductive/Obstetrics negative OB ROS                             Anesthesia  Physical Anesthesia Plan  ASA: III  Anesthesia Plan: General   Post-op Pain Management:    Induction: Intravenous  PONV Risk Score and Plan: Propofol infusion and TIVA  Airway Management Planned: Natural Airway and Nasal Cannula  Additional Equipment:   Intra-op Plan:   Post-operative Plan:   Informed Consent: I have reviewed the patients History and Physical, chart, labs and discussed the procedure including the risks, benefits and alternatives for the proposed anesthesia with the patient or authorized representative who has indicated his/her understanding and acceptance.     Dental Advisory Given  Plan Discussed with: Anesthesiologist, CRNA and Surgeon  Anesthesia Plan Comments: (Patient consented for risks of anesthesia including but not limited to:  - adverse reactions to medications - risk of airway placement if required - damage to eyes, teeth, lips or other oral mucosa - nerve damage due to positioning  - sore throat or hoarseness - Damage to heart, brain, nerves, lungs, other parts of body or loss of life  Patient voiced understanding.)        Anesthesia Quick Evaluation

## 2021-05-06 NOTE — Op Note (Signed)
[facility] Patient Name: Janice Short Procedure Date: 05/06/2021 8:33 AM MRN: [id number] Account #: [id number] Date of Birth: 27-Mar-1961 Admit Type: Outpatient Age: 60 Room: [facility] ENDO ROOM 4 Gender: Female Note Status: Finalized Procedure:             Upper GI endoscopy Indications:           Abnormal CT of the GI tract Providers:             Lucilla Lame MD, MD Medicines:             Propofol per Anesthesia Complications:         No immediate complications. Procedure:             Pre-Anesthesia Assessment:                        - Prior to the procedure, a History and Physical was                         performed, and patient medications and allergies were                         reviewed. The patient's tolerance of previous                         anesthesia was also reviewed. The risks and benefits                         of the procedure and the sedation options and risks                         were discussed with the patient. All questions were                         answered, and informed consent was obtained. Prior                         Anticoagulants: The patient has taken no previous                         anticoagulant or antiplatelet agents. ASA Grade                         Assessment: III - A patient with severe systemic                         disease. After reviewing the risks and benefits, the                         patient was deemed in satisfactory condition to                         undergo the procedure.                        After obtaining informed consent, the endoscope was                         passed under direct vision. Throughout the procedure,  the patient's blood pressure, pulse, and oxygen                         saturations were monitored continuously. The Endoscope                         was introduced through the mouth, and advanced to the                         second part of  duodenum. The upper GI endoscopy was                         accomplished without difficulty. The patient tolerated                         the procedure well. Findings:      The examined esophagus was normal.      Patchy moderate inflammation with hemorrhage characterized by erosions       was found in the entire examined stomach.      A single 8 mm sessile polyp with no bleeding was found in the second       portion of the duodenum. Preparations were made for mucosal resection.       Chromoscopy with indigo carmine was done to mark the borders of the       lesion. Indigo carmine was injected to raise the lesion. Snare mucosal       resection was performed. Resection and retrieval were complete. Impression:            - Normal esophagus.                        - Gastritis with hemorrhage.                        - A single duodenal polyp. Resected and retrieved.                        - Mucosal resection was performed. Resection and                         retrieval were complete. Recommendation:        - Discharge patient to home.                        - Resume previous diet.                        - Continue present medications.                        - Await pathology results.                        - Perform a colonoscopy today. Procedure Code(s):     --- Professional ---                        [phone], Esophagogastroduodenoscopy, flexible,                         transoral; with endoscopic mucosal resection Diagnosis Code(s):     ---  Professional ---                        R93.3, Abnormal findings on diagnostic imaging of                         other parts of digestive tract                        K29.71, Gastritis, unspecified, with bleeding                        K31.7, Polyp of stomach and duodenum CPT copyright 2019 American Medical Association. All rights reserved. The codes documented in this report are preliminary and upon coder review may  be revised to meet current  compliance requirements. Lucilla Lame MD, MD 05/06/2021 8:58:00 AM This report has been signed electronically. Number of Addenda: 0 Note Initiated On: 05/06/2021 8:33 AM Estimated Blood Loss:  Estimated blood loss: none.      [facility]

## 2021-05-07 ENCOUNTER — Encounter: Payer: Self-pay | Admitting: Gastroenterology

## 2021-05-07 ENCOUNTER — Telehealth: Payer: Self-pay

## 2021-05-07 LAB — SURGICAL PATHOLOGY

## 2021-05-07 NOTE — Progress Notes (Signed)
Chronic Care Management Pharmacy Assistant   Name: Janice Short  MRN: [id number] DOB: 1960/12/12  Chart Review for Clinical Pharmacist on  05/08/2021  Conditions to be addressed/monitored: HTN, HLD, DMII, Depression and Asthma,Carpal Tunnel Syndrome, Chronic midline low back pain with sciatica, Spinal stenosis, Vit D Deficiency, Lumbosacral Neuritis, Intrahepatic Cholangiocarcinoma  Primary concerns for visit include: Patient states she is unable to answer any questions at the moment because she is "sick as a dog". Patient hung up the phone.   Recent office visits: 05/02/2021 [facility], LCSW- No Medication changes noted 04/29/2021 Sherley Bounds (PCP)- Stop [name], Stop: HCTZ  Start: Tyler Aas start at 12 units and go up by 2 units every 3 days or down every 3 days to keep glucose between 100-140 fasting Start: Lexapro 10 mg 1 tab daily,  01/30/2021 Chrystal Land, LCSW- -A Care Guide referral was submitted for assistance with available financial resources. Outreach Pending. No Medication Changes Noted 01/30/2021 Clemetine Marker, LPN (PCP) - AWV No medication changes noted 01/03/2021 Sherley Bounds (PCP)- Referral for Colonoscopy 12/26/2020 Sherley Bounds (PCP) -AMB Referral to [place], Patient has discontinued Lexapro per note, Evolocumab discontinued due to cost of medication per note   Recent consult visits:  04/29/2021 Randa Evens, MD (Oncology)- recommend gemcitabine 800 mg per metered square along with split dose cisplatin at 35 mg per metered square.   04/25/2021 Eulogio Bear, MD (Surgical Oncology)- No Medication Changes Noted 04/21/2021 Lucilla Lame, MD (Gastroenterology)- Referral was placed for an evaluation at [facility] for possible  surgery 04/21/2021 Randa Evens, MD (Oncology)- Per note patient referredto [facility] Dr. Hyman Hopes, and to GI Dr. Allen Norris for an EGD and a colonoscopy 04/17/2021 Jarrett Soho, RN (Tumor Board Oncology)  -Malachi Bonds chemotherapy (Refer to [facility]) 04/08/2021 Randa Evens, MD (Oncology)- No Medication Changes Noted 03/25/2021 Randa Evens, MD (Oncology)- No Medication [facility] visits:  Medication Reconciliation was completed by comparing discharge summary, patient's EMR and Pharmacy list, and upon discussion with patient.  Admitted to the hospital on 05/06/2021 due to Colonoscopy and Upper Endoscopy Procedure Discharge date was 05/06/2021. Discharged from [place]?Medications Started at [facility] Discharge:??None ID  Medication Changes at Hospital Discharge: None ID  Medications Discontinued at Hospital Discharge: None ID  Medications that remain the same after Hospital Discharge:??  -All other medications will remain the same.  Admitted to the hospital on 03/24/2021 due to Nausea Discharge date was 03/24/2021 Discharged from [place]?Medications Started at [facility] Discharge:?? -started Ondansetron 4mg  prn due to Nausea  Medication Changes at Hospital Discharge: -Changed None ID  Medications Discontinued at Hospital Discharge: -Stopped None ID  Medications that remain the same after Hospital Discharge:??  -All other medications will remain the same.   Attempted  to do initial question prior to patient appointment on 05/08/2021 for CCM at 10:00 am  with Junius Argyle the Clinical pharmacist.    Patient states she is unable to answer any questions at the moment because she is "sick as a dog". Patient hung up the phone.  Please bring medications and supplements to appointment    Medications: Outpatient Encounter Medications as of 05/07/2021  Medication Sig  . albuterol (VENTOLIN HFA) 108 (90 Base) MCG/ACT inhaler INHALE 2 PUFFS BY MOUTH EVERY 6 HOURS AS NEEDED FOR WHEEZING AND FOR SHORTNESS OF BREATH  . aspirin 81 MG tablet Take 1 tablet by mouth daily.  Marland Kitchen atorvastatin (LIPITOR)  40 MG tablet Take 1 tablet (40 mg total) by mouth at bedtime.  . blood glucose meter kit and supplies Dispense based on patient and insurance preference. Use up to four times daily as directed. (FOR ICD-10 E10.9, E11.9).  Marland Kitchen diphenhydramine-acetaminophen (TYLENOL PM) 25-500 MG TABS tablet Take 1 tablet by mouth at bedtime as needed.  . Empagliflozin-metFORMIN HCl ER (SYNJARDY XR) 12.04-999 MG TB24 Take 2 tablets by mouth daily. New dose  . escitalopram (LEXAPRO) 10 MG tablet Take 1 tablet (10 mg total) by mouth daily.  . fluticasone (FLONASE) 50 MCG/ACT nasal spray USE 2 SPRAY(S) IN EACH NOSTRIL AT BEDTIME (Patient taking differently: Place 2 sprays into both nostrils daily as needed. USE 2 SPRAY(S) IN EACH NOSTRIL AT BEDTIME)  . Fluticasone-Umeclidin-Vilant (TRELEGY ELLIPTA) 100-62.5-25 MCG/INH AEPB Take 1 puff by mouth daily.  Marland Kitchen gabapentin (NEURONTIN) 300 MG capsule Take 2 capsules (600 mg total) by mouth 2 (two) times daily.  . Insulin Degludec (TRESIBA) 100 UNIT/ML SOLN Inject 12-30 units of lipase into the skin daily. To keep fasting glucose between 100-140  . Insulin Pen Needle 31G X 8 MM MISC 1 each by Does not apply route daily.  Marland Kitchen losartan (COZAAR) 25 MG tablet Take 1 tablet (25 mg total) by mouth daily.  . Na Sulfate-K Sulfate-Mg Sulf (SUPREP BOWEL PREP KIT) 17.5-3.13-1.6 GM/177ML SOLN Take 1 kit by mouth as directed.   No facility-administered encounter medications on file as of 05/07/2021.   Star Rating Drugs: Atorvastatin 40 mg- Last fill date 12/27/2020 [facility] Synjardy XR 12.04-999 mg- Last fill date 12/27/2020 [facility] Losartan 25 mg- No fill for Losartan 25 mg/ Losartan/HCTZ50-12.5 MG last filled 04/15/2021 90 day supply  [place] Pharmacist Assistant [phone]

## 2021-05-08 ENCOUNTER — Telehealth: Payer: Medicare HMO

## 2021-05-08 NOTE — Progress Notes (Deleted)
Chronic Care Management Pharmacy Note  05/08/2021 Name:  Janice Short MRN:  [id number] DOB:  Jul 16, 1961  Summary: ***  Recommendations/Changes made from today's visit: ***  Plan: ***  Subjective: Janice Short is an 60 y.o. year old female who is a primary patient of Steele Sizer, MD.  The CCM team was consulted for assistance with disease management and care coordination needs.    {CCMTELEPHONEFACETOFACE:21091510} for {CCMINITIALFOLLOWUPCHOICE:21091511} in response to provider referral for pharmacy case management and/or care coordination services.   Consent to Services:  {CCMCONSENTOPTIONS:25074}  Patient Care Team: Steele Sizer, MD as PCP - General (Family Medicine) Neldon Labella, RN as Case Manager Clent Jacks, RN as Oncology Nurse Navigator [place], [place], LCSW as Social Worker Germaine Pomfret, [facility] (Pharmacist)  Recent office visits: 05/02/2021 [facility], LCSW- No Medication changes noted 04/29/2021 Sherley Bounds (PCP)- Stop [name], Stop: HCTZ  Start: Tyler Aas start at 12 units and go up by 2 units every 3 days or down every 3 days to keep glucose between 100-140 fasting Start: Lexapro 10 mg 1 tab daily,  01/30/2021 Chrystal Land, LCSW- -A Care Guide referral was submitted for assistance with available financial resources. Outreach Pending. No Medication Changes Noted 01/30/2021 Clemetine Marker, LPN (PCP) - AWV No medication changes noted 01/03/2021 Sherley Bounds (PCP)- Referral for Colonoscopy 12/26/2020 Sherley Bounds (PCP) -AMB Referral to [place], Patient has discontinued Lexapro per note, Evolocumab discontinued due to cost of medication per note  Recent consult visits: 04/29/2021 Randa Evens, MD (Oncology)- recommend gemcitabine 800 mg per metered square along with split dose cisplatin at 35 mg per metered square. 04/25/2021 Eulogio Bear, MD (Surgical Oncology)- No Medication Changes Noted 04/21/2021  Lucilla Lame, MD (Gastroenterology)- Referral was placed for an evaluation at [place] for possible  surgery 04/21/2021 Randa Evens, MD (Oncology)- Per note patient referredto [facility] Dr. Hyman Hopes, and to GI Dr. Blima Singer an EGD and a colonoscopy 04/17/2021 Jarrett Soho, RN (Tumor Board Oncology) -Malachi Bonds chemotherapy (Refer to [facility]) 04/08/2021 Randa Evens, MD (Oncology)- No Medication Changes Noted 03/25/2021 Randa Evens, MD (Oncology)- No Medication [facility] visits: Admitted to the hospital on 05/06/2021 due to Colonoscopy and Upper Endoscopy Procedure Discharge date was 05/06/2021. Discharged from [place]?Medications Started at [facility] Discharge:??None ID  Medication Changes at Hospital Discharge: None ID  Medications Discontinued at Hospital Discharge: None ID  Medications that remain the same after Hospital Discharge:??  -All other medications will remain the same.  Admitted to the hospital on 03/24/2021 due to Nausea Discharge date was 03/24/2021 Discharged from [place]?Medications Started at [facility] Discharge:?? -started Ondansetron 59m prn due to Nausea  Medication Changes at Hospital Discharge: -Changed None ID  Medications Discontinued at Hospital Discharge: -Stopped None ID  Medications that remain the same after Hospital Discharge:??  -All other medications will remain the same.    Objective:  Lab Results  Component Value Date   CREATININE 1.08 (H) 04/21/2021   BUN 16 04/21/2021   GFRNONAA 58 (L) 04/21/2021   GFRAA 64 12/26/2020   NA 135 04/21/2021   K 3.7 04/21/2021   CALCIUM 9.0 04/21/2021   CO2 23 04/21/2021   GLUCOSE 138 (H) 04/21/2021    Lab Results  Component Value Date/Time   HGBA1C 4.8 04/29/2021 03:08 PM   HGBA1C 8.1 (A) 12/26/2020 03:56 PM   HGBA1C 6.0 (H) 02/16/2020 08:15 AM   HGBA1C 8.2 (A) 09/01/2019  11:04 AM    HGBA1C 9.2 (H) 02/08/2019 12:00 AM   MICROALBUR 1.0 12/26/2020 04:25 PM   MICROALBUR 1.1 09/01/2019 12:00 AM   MICROALBUR 20 12/17/2017 02:05 PM   MICROALBUR 100 11/19/2015 04:44 PM    Last diabetic Eye exam: No results found for: HMDIABEYEEXA  Last diabetic Foot exam: No results found for: HMDIABFOOTEX   Lab Results  Component Value Date   CHOL 197 12/26/2020   HDL 36 (L) 12/26/2020   LDLCALC 129 (H) 12/26/2020   TRIG 181 (H) 12/26/2020   CHOLHDL 5.5 (H) 12/26/2020    Hepatic Function Latest Ref Rng & Units 04/21/2021 03/24/2021 12/26/2020  Total Protein 6.5 - 8.1 g/dL 8.9(H) 8.8(H) 8.3(H)  Albumin 3.5 - 5.0 g/dL 3.3(L) 3.5 -  AST 15 - 41 U/L 55(H) 35 -  ALT 0 - 44 U/L 62(H) 41 -  Alk Phosphatase 38 - 126 U/L 733(H) 619(H) -  Total Bilirubin 0.3 - 1.2 mg/dL 2.6(H) 2.0(H) -  Bilirubin, Direct 0.0 - 0.2 mg/dL - 0.7(H) -    Lab Results  Component Value Date/Time   TSH 2.558 04/21/2021 08:42 AM   TSH 3.09 12/26/2020 04:25 PM    CBC Latest Ref Rng & Units 04/21/2021 04/02/2021 03/24/2021  WBC 4.0 - 10.5 K/uL 11.6(H) 10.4 11.9(H)  Hemoglobin 12.0 - 15.0 g/dL 12.6 13.7 13.7  Hematocrit 36.0 - 46.0 % 37.7 40.3 40.0  Platelets 150 - 400 K/uL 321 322 313    Lab Results  Component Value Date/Time   VD25OH 11 (L) 03/24/2018 11:42 AM    Clinical ASCVD: {YES/NO:21197} The 10-year ASCVD risk score Mikey Bussing DC Jr., et al., 2013) is: 33.6%   Values used to calculate the score:     Age: 46 years     Sex: Female     Is Non-Hispanic African American: Yes     Diabetic: Yes     Tobacco smoker: Yes     Systolic Blood Pressure: 102 mmHg     Is BP treated: Yes     HDL Cholesterol: 36 mg/dL     Total Cholesterol: 197 mg/dL    Depression screen [facility] 2/9 05/02/2021 04/29/2021 01/30/2021  Decreased Interest 1 1 0  Down, Depressed, Hopeless 1 1 0  PHQ - 2 Score 2 2 0  Altered sleeping 0 0 -  Tired, decreased energy 0 0 -  Change in appetite 1 1 -  Feeling bad or failure about yourself  0 0  -  Trouble concentrating 0 0 -  Moving slowly or fidgety/restless 0 0 -  Suicidal thoughts 0 0 -  PHQ-9 Score 3 3 -  Difficult doing work/chores - Not difficult at all -  Some recent data might be hidden     ***Other: (CHADS2VASc if Afib, MMRC or CAT for COPD, ACT, DEXA)  Social History   Tobacco Use  Smoking Status Current Some Day Smoker  . Packs/day: 0.25  . Types: Cigarettes  Smokeless Tobacco Never Used  Tobacco Comment   2 per day 01/30/21   BP Readings from Last 3 Encounters:  05/06/21 123/74  04/29/21 112/64  04/29/21 112/67   Pulse Readings from Last 3 Encounters:  05/06/21 96  04/29/21 94  04/29/21 90   Wt Readings from Last 3 Encounters:  05/06/21 155 lb (70.3 kg)  04/29/21 155 lb 9.6 oz (70.6 kg)  04/29/21 155 lb 11.2 oz (70.6 kg)   BMI Readings from Last 3 Encounters:  05/06/21 24.28 kg/m  04/29/21 24.37 kg/m  04/29/21  24.39 kg/m    Assessment/Interventions: Review of patient past medical history, allergies, medications, health status, including review of consultants reports, laboratory and other test data, was performed as part of comprehensive evaluation and provision of chronic care management services.   SDOH:  (Social Determinants of Health) assessments and interventions performed: {yes/no:20286}  SDOH Screenings   Alcohol Screen: Low Risk   . Last Alcohol Screening Score (AUDIT): 0  Depression (PHQ2-9): Low Risk   . PHQ-2 Score: 3  Financial Resource Strain: Medium Risk  . Difficulty of Paying Living Expenses: Somewhat hard  Food Insecurity: No Food Insecurity  . Worried About Charity fundraiser in the Last Year: Never true  . Ran Out of Food in the Last Year: Never true  Housing: Low Risk   . Last Housing Risk Score: 0  Physical Activity: Inactive  . Days of Exercise per Week: 0 days  . Minutes of Exercise per Session: 0 min  Social Connections: Moderately Isolated  . Frequency of Communication with Friends and Family: More than  three times a week  . Frequency of Social Gatherings with Friends and Family: Three times a week  . Attends Religious Services: More than 4 times per year  . Active Member of Clubs or Organizations: No  . Attends Archivist Meetings: Never  . Marital Status: Never married  Stress: Stress Concern Present  . Feeling of Stress : To some extent  Tobacco Use: High Risk  . Smoking Tobacco Use: Current Some Day Smoker  . Smokeless Tobacco Use: Never Used  Transportation Needs: No Transportation Needs  . Lack of Transportation (Medical): No  . Lack of Transportation (Non-Medical): No    CCM Care Plan  Allergies  Allergen Reactions  . Other Shortness Of Breath    Cat dander and grass pollen  . Bydureon [Exenatide] Other (See Comments)    Pain with injection not allergy    Medications Reviewed Today    Reviewed by Andria Frames, MD (Physician) on 05/06/21 at 0755  Med List Status: <None>  Medication Order Taking? Sig Documenting Provider Last Dose Status Informant  albuterol (VENTOLIN HFA) 108 (90 Base) MCG/ACT inhaler [id number] Yes INHALE 2 PUFFS BY MOUTH EVERY 6 HOURS AS NEEDED FOR WHEEZING AND FOR SHORTNESS OF Lonia Chimera, MD 05/05/2021 Unknown time Active   aspirin 81 MG tablet [id number] Yes Take 1 tablet by mouth daily. [provider] Past Week Unknown time Active            Med Note Clemetine Marker D   Tue Jan 30, 2020 10:46 AM)    atorvastatin (LIPITOR) 40 MG tablet [id number] Yes Take 1 tablet (40 mg total) by mouth at bedtime. Steele Sizer, MD 05/05/2021 Unknown time Active   blood glucose meter kit and supplies [id number] Yes Dispense based on patient and insurance preference. Use up to four times daily as directed. (FOR ICD-10 E10.9, E11.9). Steele Sizer, MD 05/05/2021 Unknown time Active   diphenhydramine-acetaminophen (TYLENOL PM) 25-500 MG TABS tablet [id number] Yes Take 1 tablet by mouth at bedtime as needed. [provider]  05/05/2021 Unknown time Active   Empagliflozin-metFORMIN HCl ER (SYNJARDY XR) 12.04-999 MG TB24 [id number] Yes Take 2 tablets by mouth daily. New dose Steele Sizer, MD 05/05/2021 Unknown time Active   escitalopram (LEXAPRO) 10 MG tablet [id number] Yes Take 1 tablet (10 mg total) by mouth daily. Steele Sizer, MD 05/05/2021 Unknown time Active   fluticasone (FLONASE) 50 MCG/ACT nasal spray [id number] Yes USE 2 SPRAY(S)  IN EACH NOSTRIL AT BEDTIME  Patient taking differently: Place 2 sprays into both nostrils daily as needed. USE 2 SPRAY(S) IN EACH NOSTRIL AT BEDTIME   Steele Sizer, MD 05/05/2021 Unknown time Active   Fluticasone-Umeclidin-Vilant (TRELEGY ELLIPTA) 100-62.5-25 MCG/INH AEPB [id number] Yes Take 1 puff by mouth daily. Steele Sizer, MD 05/05/2021 Unknown time Active   gabapentin (NEURONTIN) 300 MG capsule [id number] Yes Take 2 capsules (600 mg total) by mouth 2 (two) times daily. Steele Sizer, MD 05/05/2021 Unknown time Active   Insulin Degludec (TRESIBA) 100 UNIT/ML SOLN [id number] Yes Inject 12-30 units of lipase into the skin daily. To keep fasting glucose between 100-140 Steele Sizer, MD 05/05/2021 Unknown time Active   Insulin Pen Needle 31G X 8 MM MISC [id number] Yes 1 each by Does not apply route daily. Steele Sizer, MD 05/05/2021 Unknown time Active   losartan (COZAAR) 25 MG tablet [id number] Yes Take 1 tablet (25 mg total) by mouth daily. Steele Sizer, MD 05/05/2021 Unknown time Active   Na Sulfate-K Sulfate-Mg Sulf (SUPREP BOWEL PREP KIT) 17.5-3.13-1.6 GM/177ML SOLN [id number] Yes Take 1 kit by mouth as directed. Lucilla Lame, MD 05/05/2021 Unknown time Active           Patient Active Problem List   Diagnosis Date Noted  . Screen for colon cancer   . Rectal polyp   . Polyp of sigmoid colon   . Abnormal findings on diagnostic imaging of digestive system   . Gastritis with hemorrhage   . Polyp of duodenum   . Intrahepatic cholangiocarcinoma ([place]) 04/12/2021  . Goals of  care, counseling/discussion 04/12/2021  . Mild episode of recurrent major depressive disorder ([place]) 02/08/2019  . Dyslipidemia associated with type 2 diabetes mellitus ([place]) 12/17/2017  . Noncompliance with medication regimen 12/17/2017  . Spinal stenosis 10/15/2016  . Diabetic polyneuropathy associated with type 2 diabetes mellitus ([place]) 07/14/2016  . Chronic midline low back pain with sciatica 06/22/2016  . Primary osteoarthritis of both knees 06/22/2016  . Carpal tunnel syndrome 07/01/2015  . Cervical radiculitis 07/01/2015  . Corn or callus 07/01/2015  . Impingement syndrome of shoulder 07/01/2015  . Microalbuminuria 07/01/2015  . Compulsive tobacco user syndrome 07/01/2015  . Tenosynovitis of wrist 07/01/2015  . Benign hypertension 11/05/2009  . Uncontrolled type 2 diabetes mellitus with microalbuminuria ([place]) 11/05/2009  . Athlete's foot 08/05/2009  . Combined fat and carbohydrate induced hyperlipemia 01/08/2009  . Vitamin D deficiency 01/08/2009  . Asthma, mild intermittent 12/28/2008  . Allergic rhinitis 12/28/2008  . Lumbosacral neuritis 08/11/2007    Immunization History  Administered Date(s) Administered  . Influenza,inj,Quad PF,6+ Mos 08/14/2013, 08/02/2014, 11/19/2015, 10/15/2016, 12/26/2020  . Influenza-Unspecified 08/02/2014  . PPD Test 07/01/2015, 09/09/2016  . Pneumococcal Conjugate-13 04/20/2018  . Pneumococcal Polysaccharide-23 02/08/2019  . Td 05/08/2010  . Tdap 05/08/2010    Conditions to be addressed/monitored:  Hypertension, Hyperlipidemia, Diabetes, Asthma and Depression  There are no care plans that you recently modified to display for this patient.    Medication Assistance: {MEDASSISTANCEINFO:25044}  Compliance/Adherence/Medication fill history: Care Gaps: ***  Star-Rating Drugs: ***  Patient's preferred pharmacy is:  [facility] [address], [place] [facility] [place] [address] Phone: [phone] Fax:  [phone]  Uses pill box? {Yes or If no, why not?:20788} Pt endorses ***% compliance  We discussed: {Pharmacy options:24294} Patient decided to: {US Pharmacy Plan:23885}  Care Plan and Follow Up Patient Decision:  {FOLLOWUP:24991}  Plan: {CM FOLLOW UP PLAN:25073}  ***   Current Barriers:  . {  pharmacybarriers:24917}  Pharmacist Clinical Goal(s):  Marland Kitchen Patient will {PHARMACYGOALCHOICES:24921} through collaboration with PharmD and provider.   Interventions: . 1:1 collaboration with Steele Sizer, MD regarding development and update of comprehensive plan of care as evidenced by provider attestation and co-signature . Inter-disciplinary care team collaboration (see longitudinal plan of care) . Comprehensive medication review performed; medication list updated in electronic medical record  Hypertension (BP goal {CHL HP UPSTREAM Pharmacist BP ranges:[phone]}) -{US controlled/uncontrolled:25276} -Current treatment: . Losartan 25 mg daily  -Medications previously tried: ***  -Current home readings: *** -Current dietary habits: *** -Current exercise habits: *** -{ACTIONS;DENIES/REPORTS:21021675::"Denies"} hypotensive/hypertensive symptoms -Educated on {CCM BP Counseling:25124} -Counseled to monitor BP at home ***, document, and provide log at future appointments -{CCMPHARMDINTERVENTION:25122}  Hyperlipidemia: (LDL goal < ***) -{US controlled/uncontrolled:25276} -Current treatment: . Atorvastatin 40 mg daily -Current treatment: . Aspirin 81 mg daily -Medications previously tried: Repatha  -Current dietary patterns: *** -Current exercise habits: *** -Educated on {CCM HLD Counseling:25126} -{CCMPHARMDINTERVENTION:25122}  Diabetes (A1c goal {A1c goals:23924}) -{US controlled/uncontrolled:25276} -Current medications: . Synjardy XR 12.04-999 mg 2 tablets daily  . Tresiba 12-30 units daily  -Medications previously tried: ***  -Current home glucose readings . fasting  glucose: *** . post prandial glucose: *** -{ACTIONS;DENIES/REPORTS:21021675::"Denies"} hypoglycemic/hyperglycemic symptoms -Current meal patterns:  . breakfast: ***  . lunch: ***  . dinner: *** . snacks: *** . drinks: *** -Current exercise: *** -Educated on {CCM DM COUNSELING:25123} -Counseled to check feet daily and get yearly eye exams -{CCMPHARMDINTERVENTION:25122}  Asthma/COPD (Goal: control symptoms and prevent exacerbations) -{US controlled/uncontrolled:25276} -Current treatment  . Ventolin HFA 2 puffs every 6 hours as needed  . Trelegy 1 puff daily  -Medications previously tried: ***  -Gold Grade: {CHL HP Upstream Pharm COPD Gold TDVVO:[id number]} -Current COPD Classification:  {CHL HP Upstream Pharm COPD Classification:[phone]} -MMRC/CAT score: *** -Pulmonary function testing: *** -Exacerbations requiring treatment in last 6 months: *** -Patient {Actions; denies-reports:120008} consistent use of maintenance inhaler -Frequency of rescue inhaler use: *** -Counseled on {CCMINHALERCOUNSELING:25121} -{CCMPHARMDINTERVENTION:25122}    Depression/Anxiety (Goal: ***) -{US controlled/uncontrolled:25276} -Current treatment: . Escitalopram 10 mg daily  -Medications previously tried/failed: *** -PHQ9: *** -GAD7: *** -Connected with *** for mental health support -Educated on {CCM mental health counseling:25127} -{CCMPHARMDINTERVENTION:25122}  *** (Goal: ***) -{US controlled/uncontrolled:25276} -Current treatment  . Flonase 2 sprays daily  -Medications previously tried: ***  -{CCMPHARMDINTERVENTION:25122}   Patient Goals/Self-Care Activities . Patient will:  - {pharmacypatientgoals:24919}  Follow Up Plan: {CM FOLLOW UP IRSW:54627}

## 2021-05-15 ENCOUNTER — Inpatient Hospital Stay: Payer: Medicare HMO | Attending: Oncology | Admitting: Hospice and Palliative Medicine

## 2021-05-15 ENCOUNTER — Other Ambulatory Visit: Payer: Self-pay

## 2021-05-15 ENCOUNTER — Telehealth: Payer: Self-pay | Admitting: Oncology

## 2021-05-15 ENCOUNTER — Encounter: Payer: Self-pay | Admitting: Hospice and Palliative Medicine

## 2021-05-15 ENCOUNTER — Other Ambulatory Visit: Payer: Self-pay | Admitting: *Deleted

## 2021-05-15 VITALS — BP 108/71 | HR 84 | Temp 97.6°F | Resp 16 | Wt 150.4 lb

## 2021-05-15 DIAGNOSIS — Z841 Family history of disorders of kidney and ureter: Secondary | ICD-10-CM | POA: Diagnosis not present

## 2021-05-15 DIAGNOSIS — Z515 Encounter for palliative care: Secondary | ICD-10-CM

## 2021-05-15 DIAGNOSIS — I10 Essential (primary) hypertension: Secondary | ICD-10-CM | POA: Insufficient documentation

## 2021-05-15 DIAGNOSIS — Z5111 Encounter for antineoplastic chemotherapy: Secondary | ICD-10-CM | POA: Diagnosis not present

## 2021-05-15 DIAGNOSIS — R7989 Other specified abnormal findings of blood chemistry: Secondary | ICD-10-CM | POA: Diagnosis not present

## 2021-05-15 DIAGNOSIS — G8929 Other chronic pain: Secondary | ICD-10-CM | POA: Insufficient documentation

## 2021-05-15 DIAGNOSIS — E785 Hyperlipidemia, unspecified: Secondary | ICD-10-CM | POA: Diagnosis not present

## 2021-05-15 DIAGNOSIS — Z8249 Family history of ischemic heart disease and other diseases of the circulatory system: Secondary | ICD-10-CM | POA: Insufficient documentation

## 2021-05-15 DIAGNOSIS — Z79899 Other long term (current) drug therapy: Secondary | ICD-10-CM | POA: Insufficient documentation

## 2021-05-15 DIAGNOSIS — M549 Dorsalgia, unspecified: Secondary | ICD-10-CM | POA: Diagnosis not present

## 2021-05-15 DIAGNOSIS — R5383 Other fatigue: Secondary | ICD-10-CM | POA: Insufficient documentation

## 2021-05-15 DIAGNOSIS — R531 Weakness: Secondary | ICD-10-CM | POA: Diagnosis not present

## 2021-05-15 DIAGNOSIS — F419 Anxiety disorder, unspecified: Secondary | ICD-10-CM | POA: Diagnosis not present

## 2021-05-15 DIAGNOSIS — E119 Type 2 diabetes mellitus without complications: Secondary | ICD-10-CM | POA: Insufficient documentation

## 2021-05-15 DIAGNOSIS — C221 Intrahepatic bile duct carcinoma: Secondary | ICD-10-CM

## 2021-05-15 NOTE — Progress Notes (Signed)
[facility]  [phone] [phone]   Name: Janice Short Date: 05/15/2021 MRN: [id number]  DOB: [birth year]  Patient Care Team: Steele Sizer, MD as PCP - General (Family Medicine) Neldon Labella, RN as Case Manager Clent Jacks, RN as Oncology Nurse Navigator [place], [place], LCSW as Social Worker Germaine Pomfret, [facility] (Pharmacist)    REASON FOR CONSULTATION: Janice Short is a 60 y.o. female with multiple medical problems including hypertension, hyperlipidemia, and type 2 diabetes, who was recently diagnosed with cholangiocarcinoma.  Patient was initially unsure regarding treatment options.  She was referred to palliative care to help address goals.  SOCIAL HISTORY:     reports that she has been smoking cigarettes. She has been smoking an average of 0.25 packs per day. She has never used smokeless tobacco. She reports current alcohol use. She reports that she does not use drugs.  Patient is not married.  She lives at home with a son.  She also has a daughter who lives nearby.  Patient has a sister who is involved in her care.  ADVANCE DIRECTIVES:  Does not have  CODE STATUS:   PAST MEDICAL HISTORY: Past Medical History:  Diagnosis Date   Allergy    Asthma    Carpal tunnel syndrome on left    Cervical radiculitis    Chronic osteoarthritis    Corns and callosity    Depression    Dermatophytosis of foot    Diabetes mellitus without complication (HCC)    Gout    Hyperlipidemia    Hypertension    Impingement syndrome of left shoulder    Intrahepatic cholangiocarcinoma (HCC)    Lumbosacral neuritis    Microalbuminuria    Obesity    Supraventricular tachycardia ([place]) 07/01/2015   SVT (supraventricular tachycardia) (HCC)    Tenosynovitis of wrist    Vitamin D deficiency     PAST SURGICAL HISTORY:  Past Surgical History:  Procedure Laterality Date   ABDOMINAL HYSTERECTOMY     COLONOSCOPY  WITH PROPOFOL N/A 05/06/2021   Procedure: COLONOSCOPY WITH PROPOFOL;  Surgeon: Lucilla Lame, MD;  Location: [facility] ENDOSCOPY;  Service: Endoscopy;  Laterality: N/A;   ESOPHAGOGASTRODUODENOSCOPY (EGD) WITH PROPOFOL N/A 05/06/2021   Procedure: ESOPHAGOGASTRODUODENOSCOPY (EGD) WITH PROPOFOL;  Surgeon: Lucilla Lame, MD;  Location[facility] ENDOSCOPY;  Service: Endoscopy;  Laterality: N/A;    HEMATOLOGY/ONCOLOGY HISTORY:  Oncology History  Intrahepatic cholangiocarcinoma ([place])  04/12/2021 Initial Diagnosis   Intrahepatic cholangiocarcinoma ([place])    04/21/2021 Cancer Staging   Staging form: Intrahepatic Bile Duct, AJCC 8th Edition - Clinical: Stage IV (cT2, cN1, cM1) - Signed by Sindy Guadeloupe, MD on 05/03/2021      ALLERGIES:  is allergic to other and bydureon [exenatide].  MEDICATIONS:  Current Outpatient Medications  Medication Sig Dispense Refill   albuterol (VENTOLIN HFA) 108 (90 Base) MCG/ACT inhaler INHALE 2 PUFFS BY MOUTH EVERY 6 HOURS AS NEEDED FOR WHEEZING AND FOR SHORTNESS OF BREATH 18 g 1   aspirin 81 MG tablet Take 1 tablet by mouth daily.     atorvastatin (LIPITOR) 40 MG tablet Take 1 tablet (40 mg total) by mouth at bedtime. 90 tablet 1   blood glucose meter kit and supplies Dispense based on patient and insurance preference. Use up to four times daily as directed. (FOR ICD-10 E10.9, E11.9). 1 each 0   diphenhydramine-acetaminophen (TYLENOL PM) 25-500 MG TABS tablet Take 1 tablet by mouth at bedtime as needed.  Empagliflozin-metFORMIN HCl ER (SYNJARDY XR) 12.04-999 MG TB24 Take 2 tablets by mouth daily. New dose 180 tablet 1   escitalopram (LEXAPRO) 10 MG tablet Take 1 tablet (10 mg total) by mouth daily. 90 tablet 0   fluticasone (FLONASE) 50 MCG/ACT nasal spray USE 2 SPRAY(S) IN EACH NOSTRIL AT BEDTIME (Patient taking differently: Place 2 sprays into both nostrils daily as needed. USE 2 SPRAY(S) IN EACH NOSTRIL AT BEDTIME) 16 g 0   Fluticasone-Umeclidin-Vilant (TRELEGY ELLIPTA)  100-62.5-25 MCG/INH AEPB Take 1 puff by mouth daily. 180 each 1   gabapentin (NEURONTIN) 300 MG capsule Take 2 capsules (600 mg total) by mouth 2 (two) times daily. 360 capsule 1   Insulin Degludec (TRESIBA) 100 UNIT/ML SOLN Inject 12-30 units of lipase into the skin daily. To keep fasting glucose between 100-140 15 mL 0   Insulin Pen Needle 31G X 8 MM MISC 1 each by Does not apply route daily. 100 each 1   losartan (COZAAR) 25 MG tablet Take 1 tablet (25 mg total) by mouth daily. 90 tablet 0   Na Sulfate-K Sulfate-Mg Sulf (SUPREP BOWEL PREP KIT) 17.5-3.13-1.6 GM/177ML SOLN Take 1 kit by mouth as directed. 354 mL 0   No current facility-administered medications for this visit.    VITAL SIGNS: BP (!) 84/58   Pulse 84   Temp 97.6 F (36.4 C) (Tympanic)   Resp 16   Wt 150 lb 6.4 oz (68.2 kg)   BMI 23.56 kg/m  Filed Weights   05/15/21 1018  Weight: 150 lb 6.4 oz (68.2 kg)    Estimated body mass index is 23.56 kg/m as calculated from the following:   Height as of 05/06/21: $RemoveBef'5\' 7"'PrHivcCjwr$  (1.702 m).   Weight as of this encounter: 150 lb 6.4 oz (68.2 kg).  LABS: CBC:    Component Value Date/Time   WBC 11.6 (H) 04/21/2021 0842   HGB 12.6 04/21/2021 0842   HGB 15.3 12/06/2015 0922   HCT 37.7 04/21/2021 0842   HCT 45.1 12/06/2015 0922   PLT 321 04/21/2021 0842   PLT 236 12/06/2015 0922   MCV 94.5 04/21/2021 0842   MCV 94 12/06/2015 0922   NEUTROABS 7.4 04/21/2021 0842   NEUTROABS 3.5 12/06/2015 0922   LYMPHSABS 3.0 04/21/2021 0842   LYMPHSABS 2.7 12/06/2015 0922   MONOABS 0.8 04/21/2021 0842   EOSABS 0.3 04/21/2021 0842   EOSABS 0.3 12/06/2015 0922   BASOSABS 0.1 04/21/2021 0842   BASOSABS 0.0 12/06/2015 0922   Comprehensive Metabolic Panel:    Component Value Date/Time   NA 135 04/21/2021 0842   NA 140 12/06/2015 0922   K 3.7 04/21/2021 0842   CL 101 04/21/2021 0842   CO2 23 04/21/2021 0842   BUN 16 04/21/2021 0842   BUN 10 12/06/2015 0922   CREATININE 1.08 (H) 04/21/2021  0842   CREATININE 1.08 (H) 12/26/2020 1625   GLUCOSE 138 (H) 04/21/2021 0842   CALCIUM 9.0 04/21/2021 0842   AST 55 (H) 04/21/2021 0842   ALT 62 (H) 04/21/2021 0842   ALKPHOS 733 (H) 04/21/2021 0842   BILITOT 2.6 (H) 04/21/2021 0842   BILITOT 0.3 12/06/2015 0922   PROT 8.9 (H) 04/21/2021 0842   PROT 7.0 12/06/2015 0922   ALBUMIN 3.3 (L) 04/21/2021 0842   ALBUMIN 4.1 12/06/2015 0922    RADIOGRAPHIC STUDIES: NM PET Image Initial (PI) Skull Base To Thigh  Result Date: 04/17/2021 CLINICAL DATA:  Initial treatment strategy for poorly differentiated adenocarcinoma from recent liver biopsy. EXAM: NUCLEAR MEDICINE  PET SKULL BASE TO THIGH TECHNIQUE: 8.46 mCi F-18 FDG was injected intravenously. Full-ring PET imaging was performed from the skull base to thigh after the radiotracer. CT data was obtained and used for attenuation correction and anatomic localization. Fasting blood glucose: 148 mg/dl COMPARISON:  MRI abdomen 03/24/2021 FINDINGS: Mediastinal blood pool activity: SUV max 2.58 Liver activity: SUV max NA NECK: No hypermetabolic lymph nodes in the neck. Incidental CT findings: Thyroid goiter without thyroid nodules. Opacification of the right maxillary sinus with thickened sinus walls likely due to chronic sinus disease or large mucous retention cyst. CHEST: No hypermetabolic mediastinal or hilar nodes. No suspicious pulmonary nodules on the CT scan. No hypermetabolic breast masses, supraclavicular or axillary adenopathy. Incidental CT findings: Scattered age advanced atherosclerotic calcifications involving the aorta and coronary arteries. ABDOMEN/PELVIS: Numerous hypermetabolic hepatic lesions as demonstrated on the recent MRI. Difficult to identify and measure the liver lesions on the noncontrast CT scan. The largest lesion in the right hepatic lobe has an SUV max of 6.56. Hypermetabolic periportal and hepatoduodenal ligament lymphadenopathy is hypermetabolic with SUV max of 4.15. No obvious GI  tract primary neoplasm is identified. Specifically, do not see any evidence of the colon cancer gastric lesion or pancreatic lesion. Incidental CT findings: Stable vascular calcifications. Stable cholelithiasis. SKELETON: No findings for osseous metastatic disease. Incidental CT findings: none IMPRESSION: 1. Hypermetabolic hepatic lesions correlating with the recent MRI. 2. Hypermetabolic periportal and hepatoduodenal ligament lymphadenopathy. 3. No obvious primary neoplasm is demonstrated in the chest, abdomen or pelvis. 4. No findings for osseous metastatic disease. Electronically Signed   By: Marijo Sanes M.D.   On: 04/17/2021 11:33    PERFORMANCE STATUS (ECOG) : 0 - Asymptomatic  Review of Systems Unless otherwise noted, a complete review of systems is negative.  Physical Exam General: NAD Pulmonary: Unlabored Extremities: no edema, no joint deformities Skin: no rashes Neurological: Weakness but otherwise nonfocal  IMPRESSION: I met with patient and sister today in the clinic.  Introduced palliative care services and attempted to establish therapeutic rapport.  Patient reports that she is doing reasonably well.  She denies any symptomatic complaints at present.  She says she occasionally has abdominal pain but it is short-lived.  She says that her oral intake has decreased and she is not drinking as much fluids as she knows she should.  Do note that she was initially hypotensive in the clinic today but blood pressure was significantly improved on recheck.  Discussed importance of oral hydration.  Will refer her to nutrition.  Patient does endorse anxiety.  She has managed chronically on escitalopram.  Could consider increasing dose to 20 mg daily if needed.  We discussed her overall goals.  Patient says that she is decided she wants to "fight" and is in agreement with pursuing chemotherapy.  We will have her scheduled for follow-up with Dr. Janese Banks and for chemo education and port  placement.  Patient's primary concern with chemotherapy is regarding hair loss.  However, she says that she has a wig and would be accepting of hair loss if it achieves her goal to prolong life.   Patient says that her primary goal is to prolong life so that she can enjoy time with her 69-year-old grandchild.  At baseline, she lives at home with her son.  Patient is functionally independent with all of her care.  She has good social support from her children and siblings.  PLAN: -Continue current scope of treatment -Will benefit from ACP/MOST form -Patient will need  scheduled follow-up with Dr. Janese Banks.  Discussed with Judeen Hammans, [place] 1 month   Patient expressed understanding and was in agreement with this plan. She also understands that She can call the clinic at any time with any questions, concerns, or complaints.     Time Total: 25 minutes  Visit consisted of counseling and education dealing with the complex and emotionally intense issues of symptom management and palliative care in the setting of serious and potentially life-threatening illness.Greater than 50%  of this time was spent counseling and coordinating care related to the above assessment and plan.  Signed by: Altha Harm, PhD, NP-C

## 2021-05-15 NOTE — Telephone Encounter (Signed)
Left VM with patient to notify her of appointments made and left direct extension for patient to please return call so we set up her chemo education class.

## 2021-05-15 NOTE — Progress Notes (Signed)
Patient here for palliative care. She has questions about when she will start her chemotherapy treatment. Her BP  was low today she reports that she has not eaten today. She is not feeling light headed or short of breath. BP recheck 108/71.

## 2021-05-16 ENCOUNTER — Telehealth: Payer: Self-pay | Admitting: *Deleted

## 2021-05-16 ENCOUNTER — Ambulatory Visit (INDEPENDENT_AMBULATORY_CARE_PROVIDER_SITE_OTHER): Payer: Medicare HMO | Admitting: *Deleted

## 2021-05-16 ENCOUNTER — Telehealth: Payer: Self-pay | Admitting: Oncology

## 2021-05-16 DIAGNOSIS — E118 Type 2 diabetes mellitus with unspecified complications: Secondary | ICD-10-CM

## 2021-05-16 NOTE — Chronic Care Management (AMB) (Signed)
Chronic Care Management    Clinical Social Work Note  05/16/2021 Name: Janice Short MRN: [id number] DOB: August 25, 1961  Janice Short is a 60 y.o. year old female who is a primary care patient of Steele Sizer, MD. The CCM team was consulted to assist the patient with chronic disease management and/or care coordination needs related to: [place] and [place].   Engaged with patient by telephone for follow up visit in response to provider referral for social work chronic care management and care coordination services.   Consent to Services:  The patient was given information about Chronic Care Management services, agreed to services, and gave verbal consent prior to initiation of services.  Please see initial visit note for detailed documentation.   Patient agreed to services and consent obtained.   Assessment: Review of patient past medical history, allergies, medications, and health status, including review of relevant consultants reports was performed today as part of a comprehensive evaluation and provision of chronic care management and care coordination services.     SDOH (Social Determinants of Health) assessments and interventions performed:    Advanced Directives Status: Not addressed in this encounter.  CCM Care Plan  Allergies  Allergen Reactions   Other Shortness Of Breath    Cat dander and grass pollen   Bydureon [Exenatide] Other (See Comments)    Pain with injection not allergy    Outpatient Encounter Medications as of 05/16/2021  Medication Sig   albuterol (VENTOLIN HFA) 108 (90 Base) MCG/ACT inhaler INHALE 2 PUFFS BY MOUTH EVERY 6 HOURS AS NEEDED FOR WHEEZING AND FOR SHORTNESS OF BREATH   aspirin 81 MG tablet Take 1 tablet by mouth daily.   atorvastatin (LIPITOR) 40 MG tablet Take 1 tablet (40 mg total) by mouth at bedtime.   blood glucose meter kit and supplies Dispense based on patient and insurance preference. Use up to four times daily as  directed. (FOR ICD-10 E10.9, E11.9).   diphenhydramine-acetaminophen (TYLENOL PM) 25-500 MG TABS tablet Take 1 tablet by mouth at bedtime as needed.   Empagliflozin-metFORMIN HCl ER (SYNJARDY XR) 12.04-999 MG TB24 Take 2 tablets by mouth daily. New dose   escitalopram (LEXAPRO) 10 MG tablet Take 1 tablet (10 mg total) by mouth daily.   fluticasone (FLONASE) 50 MCG/ACT nasal spray USE 2 SPRAY(S) IN EACH NOSTRIL AT BEDTIME (Patient taking differently: Place 2 sprays into both nostrils daily as needed. USE 2 SPRAY(S) IN EACH NOSTRIL AT BEDTIME)   Fluticasone-Umeclidin-Vilant (TRELEGY ELLIPTA) 100-62.5-25 MCG/INH AEPB Take 1 puff by mouth daily.   gabapentin (NEURONTIN) 300 MG capsule Take 2 capsules (600 mg total) by mouth 2 (two) times daily.   Insulin Degludec (TRESIBA) 100 UNIT/ML SOLN Inject 12-30 units of lipase into the skin daily. To keep fasting glucose between 100-140   Insulin Pen Needle 31G X 8 MM MISC 1 each by Does not apply route daily.   losartan (COZAAR) 25 MG tablet Take 1 tablet (25 mg total) by mouth daily.   Na Sulfate-K Sulfate-Mg Sulf (SUPREP BOWEL PREP KIT) 17.5-3.13-1.6 GM/177ML SOLN Take 1 kit by mouth as directed.   No facility-administered encounter medications on file as of 05/16/2021.    Patient Active Problem List   Diagnosis Date Noted   Screen for colon cancer    Rectal polyp    Polyp of sigmoid colon    Abnormal findings on diagnostic imaging of digestive system    Gastritis with hemorrhage    Polyp of duodenum    Intrahepatic  cholangiocarcinoma ([place]) 04/12/2021   Goals of care, counseling/discussion 04/12/2021   Mild episode of recurrent major depressive disorder ([place]) 02/08/2019   Dyslipidemia associated with type 2 diabetes mellitus ([place]) 12/17/2017   Noncompliance with medication regimen 12/17/2017   Spinal stenosis 10/15/2016   Diabetic polyneuropathy associated with type 2 diabetes mellitus ([place]) 07/14/2016   Chronic midline low back pain with sciatica  06/22/2016   Primary osteoarthritis of both knees 06/22/2016   Carpal tunnel syndrome 07/01/2015   Cervical radiculitis 07/01/2015   Corn or callus 07/01/2015   Impingement syndrome of shoulder 07/01/2015   Microalbuminuria 07/01/2015   Compulsive tobacco user syndrome 07/01/2015   Tenosynovitis of wrist 07/01/2015   Benign hypertension 11/05/2009   Uncontrolled type 2 diabetes mellitus with microalbuminuria ([place]) 11/05/2009   Athlete's foot 08/05/2009   Combined fat and carbohydrate induced hyperlipemia 01/08/2009   Vitamin D deficiency 01/08/2009   Asthma, mild intermittent 12/28/2008   Allergic rhinitis 12/28/2008   Lumbosacral neuritis 08/11/2007    Conditions to be addressed/monitored: Depression; Mental Health Concerns   Care Plan : Depression (Adult)  Updates made by Vern Claude, LCSW since 05/16/2021 12:00 AM     Problem: Symptoms (Depression)      Long-Range Goal: Symptoms Monitored and Managed   Start Date: 05/02/2021  Expected End Date: 12/02/2021  This Visit's Progress: On track  Recent Progress: On track  Priority: Medium  Note:   Current Barriers:  Acute Mental Health needs related to recent diagnosis Mental Health Concerns  Suicidal Ideation/Homicidal Ideation: No  Clinical Social Work Goal(s):  Over the next 90 days, patient will work with SW bi-weekly by telephone or in person to reduce or manage symptoms related to depression patient will work with SW to address concerns related to depressed mood due to recent cancer diagnosis  Interventions: SDOH Interventions    Flowsheet Row Most Recent Value  SDOH Interventions   SDOH Interventions for the Following Domains Depression  Depression Interventions/Treatment  Counseling, Medication     Follow up phone call to patient regarding her recent diagnosis Patient discussed feeling much better since last contact-not sure what changed other than ability to adjust over time Patient confirms that the  Cancer center is currently coordinating the start of her treatment Patient encouraged to verbalize her feeling regarding recent diagnosis, emotional support provided, however seemed guarded today Self care emphasized, positive coping discussed and reinforced including consideration of long term mental health counseling Discussed plans with patient for ongoing care management follow up and provided patient with direct contact information for care management team   Patient Self Care Activities:  Performs ADL's independently Performs IADL's independently Ability for insight Strong family or social support  Patient Coping Strengths:  Supportive Relationships Family Friends Able to Communicate Effectively  Patient Self Care Deficits:  Coping struggles related to recent diagnosis         Follow Up Plan: SW will follow up with patient by phone over the next 14 business days      [employer], [place] Worker  [facility] Care Management [phone]

## 2021-05-16 NOTE — Telephone Encounter (Signed)
  Care Management   Follow Up Note   05/16/2021 Name: Janice Short MRN: [id number] DOB: March 03, 1961   Referred by: Steele Sizer, MD Reason for referral : Care Coordination   An unsuccessful telephone outreach was attempted today. The patient was referred to the case management team for assistance with care management and care coordination.   Follow Up Plan: The care management team will reach out to the patient again over the next 7 business  days.   Elliot Gurney, [place] Administrator, [facility] [phone]

## 2021-05-16 NOTE — Patient Instructions (Signed)
Visit Information   Goals Addressed             This Visit's Progress    Manage My Emotions       Timeframe:  Long-Range Goal Priority:  Medium Start Date:    05/02/21                         Expected End Date:  12/02/21                   Follow Up Date 05/30/21   - begin personal counseling if needed - call and visit an old friend - practice relaxation or meditation daily - talk about feelings with a friend, family or spiritual advisor - practice positive thinking and self-talk    Why is this important?   When you are stressed, down or upset, your body reacts too.  For example, your blood pressure may get higher; you may have a headache or stomachache.  When your emotions get the best of you, your body's ability to fight off cold and flu gets weak.  These steps will help you manage your emotions.     Notes:          The patient verbalized understanding of instructions, educational materials, and care plan provided today and declined offer to receive copy of patient instructions, educational materials, and care plan.   Telephone follow up appointment with care management team member scheduled for: 05/30/21   Elliot Gurney, [place] Worker  [facility] Care Management [phone]

## 2021-05-16 NOTE — Telephone Encounter (Signed)
Spoke with patient to confirm chemo class and first treatment day. Patient stated that she "will see about coming in". She mentioned transportation issues and I let patient know that we have a [employer] service and she said she would "think about it" and let us know.

## 2021-05-22 ENCOUNTER — Other Ambulatory Visit: Payer: Self-pay | Admitting: Oncology

## 2021-05-22 DIAGNOSIS — C221 Intrahepatic bile duct carcinoma: Secondary | ICD-10-CM

## 2021-05-22 MED ORDER — ONDANSETRON HCL 8 MG PO TABS: 8.0000 mg | ORAL_TABLET | Freq: Two times a day (BID) | ORAL | 1 refills | Status: DC | PRN

## 2021-05-22 MED ORDER — DEXAMETHASONE 4 MG PO TABS: 8.0000 mg | ORAL_TABLET | Freq: Every day | ORAL | 1 refills | Status: DC

## 2021-05-22 MED ORDER — PROCHLORPERAZINE MALEATE 10 MG PO TABS: 10.0000 mg | ORAL_TABLET | Freq: Four times a day (QID) | ORAL | 1 refills | Status: DC | PRN

## 2021-05-22 MED ORDER — LIDOCAINE-PRILOCAINE 2.5-2.5 % EX CREA: TOPICAL_CREAM | CUTANEOUS | 3 refills | Status: DC

## 2021-05-23 ENCOUNTER — Inpatient Hospital Stay: Payer: Medicare HMO

## 2021-05-23 ENCOUNTER — Telehealth: Payer: Self-pay

## 2021-05-23 NOTE — Telephone Encounter (Signed)
-----   Message from Lucilla Lame, MD sent at 05/22/2021  5:50 PM EDT ----- Please have the patient see me in the office to discuss removal of the polyp in her small intestines.

## 2021-05-23 NOTE — Telephone Encounter (Signed)
Contacted this pt to schedule follow up appt and she stated she was in class and couldn't talk at the moment. Also, stated she was starting her infusion on Monday.   Will try calling again later.

## 2021-05-26 ENCOUNTER — Other Ambulatory Visit: Payer: Self-pay

## 2021-05-26 ENCOUNTER — Inpatient Hospital Stay (HOSPITAL_BASED_OUTPATIENT_CLINIC_OR_DEPARTMENT_OTHER): Payer: Medicare HMO | Admitting: Oncology

## 2021-05-26 ENCOUNTER — Telehealth: Payer: Self-pay | Admitting: *Deleted

## 2021-05-26 ENCOUNTER — Encounter: Payer: Self-pay | Admitting: Oncology

## 2021-05-26 ENCOUNTER — Inpatient Hospital Stay: Payer: Medicare HMO

## 2021-05-26 ENCOUNTER — Other Ambulatory Visit: Payer: Self-pay | Admitting: *Deleted

## 2021-05-26 VITALS — BP 107/69 | HR 70 | Temp 97.8°F | Resp 16 | Ht 67.0 in | Wt 149.5 lb

## 2021-05-26 DIAGNOSIS — E119 Type 2 diabetes mellitus without complications: Secondary | ICD-10-CM | POA: Diagnosis not present

## 2021-05-26 DIAGNOSIS — C221 Intrahepatic bile duct carcinoma: Secondary | ICD-10-CM

## 2021-05-26 DIAGNOSIS — Z5111 Encounter for antineoplastic chemotherapy: Secondary | ICD-10-CM | POA: Diagnosis not present

## 2021-05-26 DIAGNOSIS — I10 Essential (primary) hypertension: Secondary | ICD-10-CM | POA: Diagnosis not present

## 2021-05-26 DIAGNOSIS — E785 Hyperlipidemia, unspecified: Secondary | ICD-10-CM | POA: Diagnosis not present

## 2021-05-26 DIAGNOSIS — R7989 Other specified abnormal findings of blood chemistry: Secondary | ICD-10-CM

## 2021-05-26 DIAGNOSIS — G8929 Other chronic pain: Secondary | ICD-10-CM | POA: Diagnosis not present

## 2021-05-26 DIAGNOSIS — R5383 Other fatigue: Secondary | ICD-10-CM | POA: Diagnosis not present

## 2021-05-26 DIAGNOSIS — R531 Weakness: Secondary | ICD-10-CM | POA: Diagnosis not present

## 2021-05-26 DIAGNOSIS — R945 Abnormal results of liver function studies: Secondary | ICD-10-CM

## 2021-05-26 DIAGNOSIS — M549 Dorsalgia, unspecified: Secondary | ICD-10-CM | POA: Diagnosis not present

## 2021-05-26 LAB — CBC WITH DIFFERENTIAL/PLATELET
Abs Immature Granulocytes: 0.03 10*3/uL (ref 0.00–0.07)
Basophils Absolute: 0.1 10*3/uL (ref 0.0–0.1)
Basophils Relative: 1 %
Eosinophils Absolute: 0.3 10*3/uL (ref 0.0–0.5)
Eosinophils Relative: 3 %
HCT: 31.5 % — ABNORMAL LOW (ref 36.0–46.0)
Hemoglobin: 10.3 g/dL — ABNORMAL LOW (ref 12.0–15.0)
Immature Granulocytes: 0 %
Lymphocytes Relative: 20 %
Lymphs Abs: 1.8 10*3/uL (ref 0.7–4.0)
MCH: 31.8 pg (ref 26.0–34.0)
MCHC: 32.7 g/dL (ref 30.0–36.0)
MCV: 97.2 fL (ref 80.0–100.0)
Monocytes Absolute: 0.7 10*3/uL (ref 0.1–1.0)
Monocytes Relative: 7 %
Neutro Abs: 6.4 10*3/uL (ref 1.7–7.7)
Neutrophils Relative %: 69 %
Platelets: 366 10*3/uL (ref 150–400)
RBC: 3.24 MIL/uL — ABNORMAL LOW (ref 3.87–5.11)
RDW: 14.8 % (ref 11.5–15.5)
Smear Review: ADEQUATE
WBC: 9.3 10*3/uL (ref 4.0–10.5)
nRBC: 0 % (ref 0.0–0.2)

## 2021-05-26 LAB — MAGNESIUM: Magnesium: 2.8 mg/dL — ABNORMAL HIGH (ref 1.7–2.4)

## 2021-05-26 LAB — COMPREHENSIVE METABOLIC PANEL
ALT: 79 U/L — ABNORMAL HIGH (ref 0–44)
AST: 87 U/L — ABNORMAL HIGH (ref 15–41)
Albumin: 2.8 g/dL — ABNORMAL LOW (ref 3.5–5.0)
Alkaline Phosphatase: 958 U/L — ABNORMAL HIGH (ref 38–126)
Anion gap: 9 (ref 5–15)
BUN: 28 mg/dL — ABNORMAL HIGH (ref 8–23)
CO2: 23 mmol/L (ref 22–32)
Calcium: 8.9 mg/dL (ref 8.9–10.3)
Chloride: 101 mmol/L (ref 98–111)
Creatinine, Ser: 1.24 mg/dL — ABNORMAL HIGH (ref 0.44–1.00)
GFR, Estimated: 49 mL/min — ABNORMAL LOW (ref >60)
Glucose, Bld: 208 mg/dL — ABNORMAL HIGH (ref 70–99)
Potassium: 4.7 mmol/L (ref 3.5–5.1)
Sodium: 133 mmol/L — ABNORMAL LOW (ref 135–145)
Total Bilirubin: 2.1 mg/dL — ABNORMAL HIGH (ref 0.3–1.2)
Total Protein: 8.6 g/dL — ABNORMAL HIGH (ref 6.5–8.1)

## 2021-05-26 MED ORDER — MAGNESIUM SULFATE 2 GM/50ML IV SOLN
2.0000 g | Freq: Once | INTRAVENOUS | Status: AC
Start: 2021-05-26 — End: 2021-05-26
  Administered 2021-05-26: 2 g via INTRAVENOUS
  Filled 2021-05-26: qty 50

## 2021-05-26 MED ORDER — SODIUM CHLORIDE 0.9 % IV SOLN
1400.0000 mg | Freq: Once | INTRAVENOUS | Status: AC
  Administered 2021-05-26: 1400 mg via INTRAVENOUS
  Filled 2021-05-26: qty 26.3

## 2021-05-26 MED ORDER — SODIUM CHLORIDE 0.9 % IV SOLN
35.0000 mg/m2 | Freq: Once | INTRAVENOUS | Status: AC
  Administered 2021-05-26: 63 mg via INTRAVENOUS
  Filled 2021-05-26: qty 63

## 2021-05-26 MED ORDER — SODIUM CHLORIDE 0.9 % IV SOLN
Freq: Once | INTRAVENOUS | Status: AC
Start: 2021-05-26 — End: 2021-05-26
  Filled 2021-05-26: qty 250

## 2021-05-26 MED ORDER — PALONOSETRON HCL INJECTION 0.25 MG/5ML
0.2500 mg | Freq: Once | INTRAVENOUS | Status: AC
  Administered 2021-05-26: 0.25 mg via INTRAVENOUS
  Filled 2021-05-26: qty 5

## 2021-05-26 MED ORDER — SODIUM CHLORIDE 0.9 % IV SOLN
Freq: Once | INTRAVENOUS | Status: AC
  Filled 2021-05-26: qty 250

## 2021-05-26 MED ORDER — SODIUM CHLORIDE 0.9 % IV SOLN
10.0000 mg | Freq: Once | INTRAVENOUS | Status: AC
  Administered 2021-05-26: 10 mg via INTRAVENOUS
  Filled 2021-05-26: qty 10

## 2021-05-26 MED ORDER — SODIUM CHLORIDE 0.9 % IV SOLN
150.0000 mg | Freq: Once | INTRAVENOUS | Status: AC
  Administered 2021-05-26: 150 mg via INTRAVENOUS
  Filled 2021-05-26: qty 150

## 2021-05-26 MED ORDER — POTASSIUM CHLORIDE IN NACL 20-0.9 MEQ/L-% IV SOLN
Freq: Once | INTRAVENOUS | Status: AC
  Filled 2021-05-26: qty 1000

## 2021-05-26 MED ORDER — SODIUM CHLORIDE 0.9 % IV SOLN: Freq: Once | INTRAVENOUS | Status: DC

## 2021-05-26 NOTE — Progress Notes (Signed)
Bilirubin 2.1, AST/ALT 87/79. Per Dr Janese Banks okay to proceed with treatment today

## 2021-05-26 NOTE — Progress Notes (Signed)
Hematology/Oncology Consult note [facility]  [phone] [phone]  Patient Care Team: Steele Sizer, MD as PCP - General (Family Medicine) Neldon Labella, RN as Case Manager Clent Jacks, RN as Oncology Nurse Navigator [place], [place], LCSW as Social Worker Germaine Pomfret, [facility] (Pharmacist)   Name of the patient: Janice Short  [id number]  1961-11-14   Date of visit: 05/26/21  Diagnosis- stage IV intrahepatic cholangiocarcinoma  Chief complaint/ Reason for visit-on treatment assessment prior to cycle 1 of gemcitabine cisplatin chemotherapy  Heme/Onc history:  Patient is a 60 year old African-American female with a past medical history significant for hypertension hyperlipidemia and type 2 diabetes who presented to the ER with symptoms of cramping abdominal pain and nausea.  This was followed by right upper quadrant ultrasound which showed multiple echogenic masses in the liver with nodular contour of the liver possibly suggestive of cirrhosis.  Several nodular masses in the vicinity of the pancreatic head and porta hepatic lymph nodes.  This was followed by an MRI abdomen and MRCP.  It showed right hepatic lobe masslike area measuring 4.9 x 2.9 cm.  Diffusion abnormality in the left hepatic lobe with mild left-sided biliary ductal dilatation and another area of abnormality measuring 1.4 x 1 cm.  Bulky celiac adenopathy 3 x 2.1 cm tracking into gastrohepatic recess.  Suspected portal venous invasion with convex protrusion in the right portal vein.  Left adrenal lesion measuring 2.9 x 1.5 cm.  Findings concerning for cholangiocarcinoma or biphenotypic cholangiole hepatocellular neoplasm.     Patient underwent CT-guided liver biopsy Which was consistent with adenocarcinoma poorly differentiated.  Immunohistochemistry showed CK7 positivity, CDX2 negative, Heppar negative, GATA3 negative.  CK19 positive.  Differentials include  cholangiocarcinoma, pancreatic, upper GI.  Lung and breast less likely.  However given CK19 positivity most compatible with cholangiocarcinoma.   Patient's case was discussed at tumor board and it was felt as if the lymphadenopathy was more like a confluent primary tumor mass.  Patient was referred to [facility] for second opinion to see if she would be a candidate for surgery.However upon their review of the PET scan it was deemed that this was indeed periportal adenopathy.  They also did a CT chest abdomen and pelvis with MIPS protocol and she was also found to have portocaval lymph node measuring 2.4 cm which was more consistent with metastatic disease.  She was deemed unresectable   NGS testing showed BRCA2, FGF R2, PIK3CA, RAD 54L, CDC73, CDK N2 A/B CR EBBP rearrangement exon 31     Interval history-patient's boyfriend has colon cancer and appears to be not doing well.  She is stressed out because of that.  States that she has been doing her protein drink and keeping up with her oral intake.  She has lost 6 pounds over the last 1 month.  Denies any significant abdominal pain  ECOG PS- 1 Pain scale- 0   Review of systems- Review of Systems  Constitutional:  Positive for malaise/fatigue. Negative for chills, fever and weight loss.  HENT:  Negative for congestion, ear discharge and nosebleeds.   Eyes:  Negative for blurred vision.  Respiratory:  Negative for cough, hemoptysis, sputum production, shortness of breath and wheezing.   Cardiovascular:  Negative for chest pain, palpitations, orthopnea and claudication.  Gastrointestinal:  Negative for abdominal pain, blood in stool, constipation, diarrhea, heartburn, melena, nausea and vomiting.  Genitourinary:  Negative for dysuria, flank pain, frequency, hematuria and urgency.  Musculoskeletal:  Negative for  back pain, joint pain and myalgias.  Skin:  Negative for rash.  Neurological:  Negative for dizziness, tingling, focal weakness, seizures,  weakness and headaches.  Endo/Heme/Allergies:  Does not bruise/bleed easily.  Psychiatric/Behavioral:  Negative for depression and suicidal ideas. The patient does not have insomnia.      Allergies  Allergen Reactions   Other Shortness Of Breath    Cat dander and grass pollen   Bydureon [Exenatide] Other (See Comments)    Pain with injection not allergy     Past Medical History:  Diagnosis Date   Allergy    Asthma    Carpal tunnel syndrome on left    Cervical radiculitis    Chronic osteoarthritis    Corns and callosity    Depression    Dermatophytosis of foot    Diabetes mellitus without complication (HCC)    Gout    Hyperlipidemia    Hypertension    Impingement syndrome of left shoulder    Intrahepatic cholangiocarcinoma (HCC)    Lumbosacral neuritis    Microalbuminuria    Obesity    Supraventricular tachycardia ([place]) 07/01/2015   SVT (supraventricular tachycardia) (HCC)    Tenosynovitis of wrist    Vitamin D deficiency      Past Surgical History:  Procedure Laterality Date   ABDOMINAL HYSTERECTOMY     COLONOSCOPY WITH PROPOFOL N/A 05/06/2021   Procedure: COLONOSCOPY WITH PROPOFOL;  Surgeon: Lucilla Lame, MD;  Location: [facility] ENDOSCOPY;  Service: Endoscopy;  Laterality: N/A;   ESOPHAGOGASTRODUODENOSCOPY (EGD) WITH PROPOFOL N/A 05/06/2021   Procedure: ESOPHAGOGASTRODUODENOSCOPY (EGD) WITH PROPOFOL;  Surgeon: Lucilla Lame, MD;  Location: [facility] ENDOSCOPY;  Service: Endoscopy;  Laterality: N/A;    Social History   Socioeconomic History   Marital status: Single    Spouse name: Not on file   Number of children: 2   Years of education: Not on file   Highest education level: Not on file  Occupational History   Occupation: disable     Comment: from chronic back pain, 2019  Tobacco Use   Smoking status: Some Days    Packs/day: 0.25    Pack years: 0.00    Types: Cigarettes   Smokeless tobacco: Never   Tobacco comments:    2 per day 01/30/21  Vaping Use   Vaping  Use: Never used  Substance and Sexual Activity   Alcohol use: Yes    Alcohol/week: 0.0 standard drinks    Comment: rare maybe 1-2  year   Drug use: No   Sexual activity: Yes  Other Topics Concern   Not on file  Social History Narrative   She is on disability for chronic back pain. Pt's son lives with her   Social Determinants of Health   Financial Resource Strain: Medium Risk   Difficulty of Paying Living Expenses: Somewhat hard  Food Insecurity: No Food Insecurity   Worried About Charity fundraiser in the Last Year: Never true   Arboriculturist in the Last Year: Never true  Transportation Needs: No Transportation Needs   Lack of Transportation (Medical): No   Lack of Transportation (Non-Medical): No  Physical Activity: Inactive   Days of Exercise per Week: 0 days   Minutes of Exercise per Session: 0 min  Stress: Stress Concern Present   Feeling of Stress : To some extent  Social Connections: Moderately Isolated   Frequency of Communication with Friends and Family: More than three times a week   Frequency of Social Gatherings with Friends and  Family: Three times a week   Attends Religious Services: More than 4 times per year   Active Member of Clubs or Organizations: No   Attends Archivist Meetings: Never   Marital Status: Never married  Human resources officer Violence: Not At Risk   Fear of Current or Ex-Partner: No   Emotionally Abused: No   Physically Abused: No   Sexually Abused: No    Family History  Problem Relation Age of Onset   Heart attack Mother    CAD Mother    Kidney disease Father      Current Outpatient Medications:    albuterol (VENTOLIN HFA) 108 (90 Base) MCG/ACT inhaler, INHALE 2 PUFFS BY MOUTH EVERY 6 HOURS AS NEEDED FOR WHEEZING AND FOR SHORTNESS OF BREATH, Disp: 18 g, Rfl: 1   aspirin 81 MG tablet, Take 1 tablet by mouth daily., Disp: , Rfl:    atorvastatin (LIPITOR) 40 MG tablet, Take 1 tablet (40 mg total) by mouth at bedtime., Disp: 90  tablet, Rfl: 1   blood glucose meter kit and supplies, Dispense based on patient and insurance preference. Use up to four times daily as directed. (FOR ICD-10 E10.9, E11.9)., Disp: 1 each, Rfl: 0   diphenhydramine-acetaminophen (TYLENOL PM) 25-500 MG TABS tablet, Take 1 tablet by mouth at bedtime as needed., Disp: , Rfl:    Empagliflozin-metFORMIN HCl ER (SYNJARDY XR) 12.04-999 MG TB24, Take 2 tablets by mouth daily. New dose, Disp: 180 tablet, Rfl: 1   escitalopram (LEXAPRO) 10 MG tablet, Take 1 tablet (10 mg total) by mouth daily., Disp: 90 tablet, Rfl: 0   fluticasone (FLONASE) 50 MCG/ACT nasal spray, USE 2 SPRAY(S) IN EACH NOSTRIL AT BEDTIME (Patient taking differently: Place 2 sprays into both nostrils daily as needed. USE 2 SPRAY(S) IN EACH NOSTRIL AT BEDTIME), Disp: 16 g, Rfl: 0   Fluticasone-Umeclidin-Vilant (TRELEGY ELLIPTA) 100-62.5-25 MCG/INH AEPB, Take 1 puff by mouth daily., Disp: 180 each, Rfl: 1   gabapentin (NEURONTIN) 300 MG capsule, Take 2 capsules (600 mg total) by mouth 2 (two) times daily., Disp: 360 capsule, Rfl: 1   Insulin Degludec (TRESIBA) 100 UNIT/ML SOLN, Inject 12-30 units of lipase into the skin daily. To keep fasting glucose between 100-140, Disp: 15 mL, Rfl: 0   Insulin Pen Needle 31G X 8 MM MISC, 1 each by Does not apply route daily., Disp: 100 each, Rfl: 1   losartan (COZAAR) 25 MG tablet, Take 1 tablet (25 mg total) by mouth daily., Disp: 90 tablet, Rfl: 0   dexamethasone (DECADRON) 4 MG tablet, Take 2 tablets (8 mg total) by mouth daily. Take daily x 3 days starting the day after cisplatin chemotherapy. Take with food. (Patient not taking: Reported on 05/26/2021), Disp: 30 tablet, Rfl: 1   lidocaine-prilocaine (EMLA) cream, Apply to affected area once (Patient not taking: Reported on 05/26/2021), Disp: 30 g, Rfl: 3   ondansetron (ZOFRAN) 8 MG tablet, Take 1 tablet (8 mg total) by mouth 2 (two) times daily as needed. Start on the third day after cisplatin chemotherapy.  (Patient not taking: Reported on 05/26/2021), Disp: 30 tablet, Rfl: 1   prochlorperazine (COMPAZINE) 10 MG tablet, Take 1 tablet (10 mg total) by mouth every 6 (six) hours as needed (Nausea or vomiting). (Patient not taking: Reported on 05/26/2021), Disp: 30 tablet, Rfl: 1 No current facility-administered medications for this visit.  Facility-Administered Medications Ordered in Other Visits:    0.9 %  sodium chloride infusion, , Intravenous, Once, Sindy Guadeloupe, MD  CISplatin (PLATINOL) 63 mg in sodium chloride 0.9 % 250 mL chemo infusion, 35 mg/m2 (Treatment Plan Recorded), Intravenous, Once, Sindy Guadeloupe, MD   dexamethasone (DECADRON) 10 mg in sodium chloride 0.9 % 50 mL IVPB, 10 mg, Intravenous, Once, Sindy Guadeloupe, MD   fosaprepitant (EMEND) 150 mg in sodium chloride 0.9 % 145 mL IVPB, 150 mg, Intravenous, Once, Sindy Guadeloupe, MD   gemcitabine (GEMZAR) 1,400 mg in sodium chloride 0.9 % 250 mL chemo infusion, 1,400 mg, Intravenous, Once, Sindy Guadeloupe, MD   magnesium sulfate IVPB 2 g 50 mL, 2 g, Intravenous, Once, Borders, Kirt Boys, NP, Last Rate: 50 mL/hr at 05/26/21 1011, 2 g at 05/26/21 1011   palonosetron (ALOXI) injection 0.25 mg, 0.25 mg, Intravenous, Once, Sindy Guadeloupe, MD  Physical exam:  Vitals:   05/26/21 0837  BP: 107/69  Pulse: 70  Resp: 16  Temp: 97.8 F (36.6 C)  TempSrc: Oral  Weight: 149 lb 8 oz (67.8 kg)  Height: _0  (1.702 m)   Physical Exam Constitutional:      General: She is not in acute distress. Eyes:     Comments: Mild scleral icterus  Cardiovascular:     Rate and Rhythm: Normal rate and regular rhythm.     Heart sounds: Normal heart sounds.  Pulmonary:     Effort: Pulmonary effort is normal.     Breath sounds: Normal breath sounds.  Abdominal:     General: Bowel sounds are normal.     Palpations: Abdomen is soft.  Musculoskeletal:     Cervical back: Normal range of motion.  Skin:    General: Skin is warm and dry.  Neurological:      Mental Status: She is alert and oriented to person, place, and time.     CMP Latest Ref Rng & Units 05/26/2021  Glucose 70 - 99 mg/dL 208(H)  BUN 8 - 23 mg/dL 28(H)  Creatinine 0.44 - 1.00 mg/dL 1.24(H)  Sodium 135 - 145 mmol/L 133(L)  Potassium 3.5 - 5.1 mmol/L 4.7  Chloride 98 - 111 mmol/L 101  CO2 22 - 32 mmol/L 23  Calcium 8.9 - 10.3 mg/dL 8.9  Total Protein 6.5 - 8.1 g/dL 8.6(H)  Total Bilirubin 0.3 - 1.2 mg/dL 2.1(H)  Alkaline Phos 38 - 126 U/L 958(H)  AST 15 - 41 U/L 87(H)  ALT 0 - 44 U/L 79(H)   CBC Latest Ref Rng & Units 05/26/2021  WBC 4.0 - 10.5 K/uL 9.3  Hemoglobin 12.0 - 15.0 g/dL 10.3(L)  Hematocrit 36.0 - 46.0 % 31.5(L)  Platelets 150 - 400 K/uL 366          Assessment and plan- Patient is a 60 y.o. female with stage IV intrahepatic cholangiocarcinoma.  She is here for on treatment assessment prior to cycle 1 of gemcitabine cisplatin chemotherapy  Discussed again that patient has stage IV disease and treatment would be palliative and not curative.  She is agreeable to proceeding with gemcitabine and cisplatin chemotherapy starting today which will be given 2 weeks on and 1 week off.  Cisplatin will be given at 35 mg per metered squared and gemcitabine at 800 mg per metered square.  She will directly proceed for cycle 1 day 8 next week with on pro Neulasta support and I will see her back in 3 weeks for cycle 2-day 1.  Discussed risks and benefits of chemotherapy including all but not limited to nausea, vomiting, low blood counts, risk of infections and hospitalization.  Risk of peripheral neuropathy associated with cisplatin.  Patient understands and agrees to proceed as planned.  Abnormal LFTs: Secondary to intrahepatic cholangiocarcinoma.  Continue to monitor     Visit Diagnosis 1. Encounter for antineoplastic chemotherapy   2. Intrahepatic cholangiocarcinoma ([place])   3. Abnormal LFTs      Dr. Randa Evens, MD, MPH [facility] at [facility] [phone] 05/26/2021 11:00 AM

## 2021-05-26 NOTE — Chronic Care Management (AMB) (Signed)
  Care Management   Note  05/26/2021 Name: Janice Short MRN: [id number] DOB: 09-Aug-1961  Janice Short is a 60 y.o. year old female who is a primary care patient of Steele Sizer, MD and is actively engaged with the care management team. I reached out to [name] by phone today to assist with re-scheduling a follow up visit with the Licensed Clinical Social Worker  Follow up plan: Unsuccessful telephone outreach attempt made. A HIPAA compliant phone message was left for the patient providing contact information and requesting a return call.  The care management team will reach out to the patient again over the next 7 days.  If patient returns call to provider office, please advise to call [place] at [facility]

## 2021-05-26 NOTE — Patient Instructions (Signed)
[facility]  Discharge Instructions: Thank you for choosing [facility] to provide your oncology and hematology care.  If you have a lab appointment with the [facility], please go directly to the [facility] and check in at the registration area.  Wear comfortable clothing and clothing appropriate for easy access to any Portacath or PICC line.   We strive to give you quality time with your provider. You may need to reschedule your appointment if you arrive late (15 or more minutes).  Arriving late affects you and other patients whose appointments are after yours.  Also, if you miss three or more appointments without notifying the office, you may be dismissed from the clinic at the provider's discretion.      For prescription refill requests, have your pharmacy contact our office and allow 72 hours for refills to be completed.    Today you received the following chemotherapy and/or immunotherapy agents Cisplatin, Gemzar      To help prevent nausea and vomiting after your treatment, we encourage you to take your nausea medication as directed.  BELOW ARE SYMPTOMS THAT SHOULD BE REPORTED IMMEDIATELY: *FEVER GREATER THAN 100.4 F (38 C) OR HIGHER *CHILLS OR SWEATING *NAUSEA AND VOMITING THAT IS NOT CONTROLLED WITH YOUR NAUSEA MEDICATION *UNUSUAL SHORTNESS OF BREATH *UNUSUAL BRUISING OR BLEEDING *URINARY PROBLEMS (pain or burning when urinating, or frequent urination) *BOWEL PROBLEMS (unusual diarrhea, constipation, pain near the anus) TENDERNESS IN MOUTH AND THROAT WITH OR WITHOUT PRESENCE OF ULCERS (sore throat, sores in mouth, or a toothache) UNUSUAL RASH, SWELLING OR PAIN  UNUSUAL VAGINAL DISCHARGE OR ITCHING   Items with * indicate a potential emergency and should be followed up as soon as possible or go to the Emergency Department if any problems should occur.  Please show the CHEMOTHERAPY ALERT CARD or IMMUNOTHERAPY ALERT CARD at  check-in to the Emergency Department and triage nurse.  Should you have questions after your visit or need to cancel or reschedule your appointment, please contact [facility]  [phone] and follow the prompts.  Office hours are 8:00 a.m. to 4:30 p.m. Monday - Friday. Please note that voicemails left after 4:00 p.m. may not be returned until the following business day.  We are closed weekends and major holidays. You have access to a nurse at all times for urgent questions. Please call the main number to the clinic [phone] and follow the prompts.  For any non-urgent questions, you may also contact your provider using MyChart. We now offer e-Visits for anyone 62 and older to request care online for non-urgent symptoms. For details visit [email or web address].   Also download the MyChart app! Go to the app store, search "MyChart", open the app, select [facility], and log in with your MyChart username and password.  Due to Covid, a mask is required upon entering the hospital/clinic. If you do not have a mask, one will be given to you upon arrival. For doctor visits, patients may have 1 support person aged 89 or older with them. For treatment visits, patients cannot have anyone with them due to current Covid guidelines and our immunocompromised population. Gemcitabine injection What is this medication? GEMCITABINE (jem SYE ta been) is a chemotherapy drug. This medicine is used to treat many types of cancer like breast cancer, lung cancer, pancreatic cancer,and ovarian cancer. This medicine may be used for other purposes; ask your health care provider orpharmacist if you have questions. COMMON BRAND NAME(S): Gemzar,  Infugem What should I tell my care team before I take this medication? They need to know if you have any of these conditions: blood disorders infection kidney disease liver disease lung or breathing disease, like asthma recent or ongoing  radiation therapy an unusual or allergic reaction to gemcitabine, other chemotherapy, other medicines, foods, dyes, or preservatives pregnant or trying to get pregnant breast-feeding How should I use this medication? This drug is given as an infusion into a vein. It is administered in a hospitalor clinic by a specially trained health care professional. Talk to your pediatrician regarding the use of this medicine in children.Special care may be needed. Overdosage: If you think you have taken too much of this medicine contact apoison control center or emergency room at once. NOTE: This medicine is only for you. Do not share this medicine with others. What if I miss a dose? It is important not to miss your dose. Call your doctor or health careprofessional if you are unable to keep an appointment. What may interact with this medication? medicines to increase blood counts like filgrastim, pegfilgrastim, sargramostim some other chemotherapy drugs like cisplatin vaccines Talk to your doctor or health care professional before taking any of thesemedicines: acetaminophen aspirin ibuprofen ketoprofen naproxen This list may not describe all possible interactions. Give your health care provider a list of all the medicines, herbs, non-prescription drugs, or dietary supplements you use. Also tell them if you smoke, drink alcohol, or use illegaldrugs. Some items may interact with your medicine. What should I watch for while using this medication? Visit your doctor for checks on your progress. This drug may make you feel generally unwell. This is not uncommon, as chemotherapy can affect healthy cells as well as cancer cells. Report any side effects. Continue your course oftreatment even though you feel ill unless your doctor tells you to stop. In some cases, you may be given additional medicines to help with side effects.Follow all directions for their use. Call your doctor or health care professional for  advice if you get a fever, chills or sore throat, or other symptoms of a cold or flu. Do not treat yourself. This drug decreases your body's ability to fight infections. Try toavoid being around people who are sick. This medicine may increase your risk to bruise or bleed. Call your doctor orhealth care professional if you notice any unusual bleeding. Be careful brushing and flossing your teeth or using a toothpick because you may get an infection or bleed more easily. If you have any dental work done,tell your dentist you are receiving this medicine. Avoid taking products that contain aspirin, acetaminophen, ibuprofen, naproxen, or ketoprofen unless instructed by your doctor. These medicines may hide afever. Do not become pregnant while taking this medicine or for 6 months after stopping it. Women should inform their doctor if they wish to become pregnant or think they might be pregnant. Men should not father a child while taking this medicine and for 3 months after stopping it. There is a potential for serious side effects to an unborn child. Talk to your health care professional or pharmacist for more information. Do not breast-feed an infant while takingthis medicine or for at least 1 week after stopping it. Men should inform their doctors if they wish to father a child. This medicine may lower sperm counts. Talk with your doctor or health care professional ifyou are concerned about your fertility. What side effects may I notice from receiving this medication? Side effects that you should  report to your doctor or health care professionalas soon as possible: allergic reactions like skin rash, itching or hives, swelling of the face, lips, or tongue breathing problems pain, redness, or irritation at site where injected signs and symptoms of a dangerous change in heartbeat or heart rhythm like chest pain; dizziness; fast or irregular heartbeat; palpitations; feeling faint or lightheaded, falls; breathing  problems signs of decreased platelets or bleeding - bruising, pinpoint red spots on the skin, black, tarry stools, blood in the urine signs of decreased red blood cells - unusually weak or tired, feeling faint or lightheaded, falls signs of infection - fever or chills, cough, sore throat, pain or difficulty passing urine signs and symptoms of kidney injury like trouble passing urine or change in the amount of urine signs and symptoms of liver injury like dark yellow or brown urine; general ill feeling or flu-like symptoms; light-colored stools; loss of appetite; nausea; right upper belly pain; unusually weak or tired; yellowing of the eyes or skin swelling of ankles, feet, hands Side effects that usually do not require medical attention (report to yourdoctor or health care professional if they continue or are bothersome): constipation diarrhea hair loss loss of appetite nausea rash vomiting This list may not describe all possible side effects. Call your doctor for medical advice about side effects. You may report side effects to FDA at1-800-FDA-1088. Where should I keep my medication? This drug is given in a hospital or clinic and will not be stored at home. NOTE: This sheet is a summary. It may not cover all possible information. If you have questions about this medicine, talk to your doctor, pharmacist, orhealth care provider.  2022 Elsevier/Gold Standard (2018-02-16 18:06:11) Cisplatin injection What is this medication? CISPLATIN (SIS pla tin) is a chemotherapy drug. It targets fast dividing cells, like cancer cells, and causes these cells to die. This medicine is used totreat many types of cancer like bladder, ovarian, and testicular cancers. This medicine may be used for other purposes; ask your health care provider orpharmacist if you have questions. COMMON BRAND NAME(S): Platinol, Platinol -AQ What should I tell my care team before I take this medication? They need to know if you  have any of these conditions: eye disease, vision problems hearing problems kidney disease low blood counts, like white cells, platelets, or red blood cells tingling of the fingers or toes, or other nerve disorder an unusual or allergic reaction to cisplatin, carboplatin, oxaliplatin, other medicines, foods, dyes, or preservatives pregnant or trying to get pregnant breast-feeding How should I use this medication? This drug is given as an infusion into a vein. It is administered in a hospitalor clinic by a specially trained health care professional. Talk to your pediatrician regarding the use of this medicine in children.Special care may be needed. Overdosage: If you think you have taken too much of this medicine contact apoison control center or emergency room at once. NOTE: This medicine is only for you. Do not share this medicine with others. What if I miss a dose? It is important not to miss a dose. Call your doctor or health careprofessional if you are unable to keep an appointment. What may interact with this medication? This medicine may interact with the following medications: foscarnet certain antibiotics like amikacin, gentamicin, neomycin, polymyxin B, streptomycin, tobramycin, vancomycin This list may not describe all possible interactions. Give your health care provider a list of all the medicines, herbs, non-prescription drugs, or dietary supplements you use. Also tell them if you  smoke, drink alcohol, or use illegaldrugs. Some items may interact with your medicine. What should I watch for while using this medication? Your condition will be monitored carefully while you are receiving this medicine. You will need important blood work done while you are taking thismedicine. This drug may make you feel generally unwell. This is not uncommon, as chemotherapy can affect healthy cells as well as cancer cells. Report any side effects. Continue your course of treatment even though you  feel ill unless yourdoctor tells you to stop. This medicine may increase your risk of getting an infection. Call your healthcare professional for advice if you get a fever, chills, or sore throat, or other symptoms of a cold or flu. Do not treat yourself. Try to avoid beingaround people who are sick. Avoid taking medicines that contain aspirin, acetaminophen, ibuprofen, naproxen, or ketoprofen unless instructed by your healthcare professional.These medicines may hide a fever. This medicine may increase your risk to bruise or bleed. Call your doctor orhealth care professional if you notice any unusual bleeding. Be careful brushing and flossing your teeth or using a toothpick because you may get an infection or bleed more easily. If you have any dental work done,tell your dentist you are receiving this medicine. Do not become pregnant while taking this medicine or for 14 months after stopping it. Women should inform their healthcare professional if they wish to become pregnant or think they might be pregnant. Men should not father a child while taking this medicine and for 11 months after stopping it. There is potential for serious side effects to an unborn child. Talk to your healthcareprofessional for more information. Do not breast-feed an infant while taking this medicine. This medicine has caused ovarian failure in some women. This medicine may make it more difficult to get pregnant. Talk to your healthcare professional if [place] Sellers concerned about your fertility. This medicine has caused decreased sperm counts in some men. This may make it more difficult to father a child. Talk to your healthcare professional if [place] S[place] concerned about your fertility. Drink fluids as directed while you are taking this medicine. This will helpprotect your kidneys. Call your doctor or health care professional if you get diarrhea. Do not treatyourself. What side effects may I notice from receiving this medication? Side  effects that you should report to your doctor or health care professionalas soon as possible: allergic reactions like skin rash, itching or hives, swelling of the face, lips, or tongue blurred vision changes in vision decreased hearing or ringing of the ears nausea, vomiting pain, redness, or irritation at site where injected pain, tingling, numbness in the hands or feet signs and symptoms of bleeding such as bloody or black, tarry stools; red or dark brown urine; spitting up blood or brown material that looks like coffee grounds; red spots on the skin; unusual bruising or bleeding from the eyes, gums, or nose signs and symptoms of infection like fever; chills; cough; sore throat; pain or trouble passing urine signs and symptoms of kidney injury like trouble passing urine or change in the amount of urine signs and symptoms of low red blood cells or anemia such as unusually weak or tired; feeling faint or lightheaded; falls; breathing problems Side effects that usually do not require medical attention (report to yourdoctor or health care professional if they continue or are bothersome): loss of appetite mouth sores muscle cramps This list may not describe all possible side effects. Call your doctor for medical advice about side effects. You  may report side effects to FDA at1-800-FDA-1088. Where should I keep my medication? This drug is given in a hospital or clinic and will not be stored at home. NOTE: This sheet is a summary. It may not cover all possible information. If you have questions about this medicine, talk to your doctor, pharmacist, orhealth care provider.  2022 Elsevier/Gold Standard (2018-11-18 15:59:17)

## 2021-05-26 NOTE — Progress Notes (Signed)
Pt ok to start with chemo. She thought that she needs port but if she has good veins , pt to check with Janese Banks

## 2021-05-26 NOTE — Telephone Encounter (Signed)
Called and left a message for call back  

## 2021-05-26 NOTE — Progress Notes (Signed)
Amb

## 2021-05-27 ENCOUNTER — Telehealth: Payer: Self-pay

## 2021-05-27 NOTE — Telephone Encounter (Signed)
Called and left a message for call back and sent mychart message  

## 2021-05-27 NOTE — Telephone Encounter (Signed)
Telephone call to patient for follow up after receiving first infusion.   Patient states infusion went great.  States eating good and drinking plenty of fluids.   Denies any nausea or vomiting.  Encouraged patient to call for any concerns or questions. 

## 2021-05-28 ENCOUNTER — Ambulatory Visit: Payer: Self-pay

## 2021-05-28 DIAGNOSIS — I152 Hypertension secondary to endocrine disorders: Secondary | ICD-10-CM

## 2021-05-28 DIAGNOSIS — C221 Intrahepatic bile duct carcinoma: Secondary | ICD-10-CM

## 2021-05-28 DIAGNOSIS — E1159 Type 2 diabetes mellitus with other circulatory complications: Secondary | ICD-10-CM | POA: Diagnosis not present

## 2021-05-28 DIAGNOSIS — E118 Type 2 diabetes mellitus with unspecified complications: Secondary | ICD-10-CM | POA: Diagnosis not present

## 2021-05-28 NOTE — Chronic Care Management (AMB) (Signed)
  Chronic Care Management   Note  05/28/2021 Name: NAKAILA FREEZE MRN: [id number] DOB: 04/18/1961     Care Coordination Only: Ms. Swaim is currently enrolled in the Chronic Care Management program. Unable to complete outreach as anticipated today. Currently completing evaluations/follow-up due to Intrahepatic cholangiocarcinoma. Will reschedule and complete outreach when she is available.   [facility] Care Management [facility] [phone]

## 2021-05-29 ENCOUNTER — Telehealth: Payer: Self-pay | Admitting: *Deleted

## 2021-05-29 NOTE — Chronic Care Management (AMB) (Signed)
  Care Management   Note  05/29/2021 Name: MIYONNA ORMISTON MRN: [id number] DOB: [birth year]  JORRYN HERSHBERGER is a 60 y.o. year old female who is a primary care patient of Steele Sizer, MD and is actively engaged with the care management team. I reached out to [name] by phone today to assist with re-scheduling a follow up visit with the Licensed Clinical Social Worker  Follow up plan: Unsuccessful telephone outreach attempt made. A HIPAA compliant phone message was left for the patient providing contact information and requesting a return call.  The care management team will reach out to the patient again over the next 7 days.  If patient returns call to provider office, please advise to call [facility] at [phone].  [facility]

## 2021-05-29 NOTE — Telephone Encounter (Signed)
Patient called reporting that she is very constipated, is currently on toilet with cramps and has been there for 30 minutes trying to get it worked down by massaging her stomach and backside. Advised her to get senna and Miralax, but that if she needs immediate relief to take either MOM or Magnesium Citrate. Patient states when she is able to get off toilet she will try to get some

## 2021-05-30 ENCOUNTER — Telehealth: Payer: Self-pay

## 2021-06-02 ENCOUNTER — Inpatient Hospital Stay: Payer: Medicare HMO

## 2021-06-02 NOTE — Progress Notes (Deleted)
Name: Janice Short   MRN: [id number]    DOB: 11-24-1961   Date:06/02/2021       Progress Note  Subjective  Chief Complaint  Follow Up  HPI  DMII:  She is on 50 units of Xultophy and also Synajrdi , however her A1C went from  8.1 % and it is down to 4.8 % today.  She has dyslipidemia, but states neuropathy symptoms have resolved. Since April she has not been able to eat much due to new diagnosis of cholangiocarcinoma. We will stop Xultophy today and change to tresiba - low dose and consider stopping Synjardi depending on values. Still taking statin therapy   Cancer:  she has lost 44 lbs in the past year, she was not trying when she was seen in our office back in January 22 we had discussed colonoscopy , lung CT and look for cancer but she chose a fit test and asked to hold off on further testing. She went to [facility] on 03/2021 with nausea/abdominal cramping  and was diagnosed with intrahepatic holangiocarcinoma. She is seeing Dr. Janese Banks ( oncologist ) , she had a PET scan done 04/16/2021, she had a visit with surgeon and was not advisable to have surgery, she will have EGD and colonoscopy and is going to chemo soon if she decides to proceed with it    MDD: she has a long history of depression, periods of crying spell, lack of motivation, took lexapro for a while, she had weaned self off on her own, but feeling worse since new diagnosis of cancer and would like to resume therapy     Asthma/copd syndrome: still smokes cigarettes intermittently, used to be a heavy smoker, but down to less than 0.25 pack daily, she states cough has improved very seldom has wheezing,  she denies SOB with activity . She uses Trelegy but not daily , last time was one week ago, explained it expires after being opened for 60 days    WUG:QBVQXI medication, no chest pain or palpitation, bp is low and we will stop hctz and continue losartan for now    Chronic back pain: she has spinal stenosis, never had surgery she has disability.  She states pain is daily, described as aching , right now is 7/10 , very seldom has radiculitis down left side, but no saddle anesthesia. Unchanged   Patient Active Problem List   Diagnosis Date Noted   Screen for colon cancer    Rectal polyp    Polyp of sigmoid colon    Abnormal findings on diagnostic imaging of digestive system    Gastritis with hemorrhage    Polyp of duodenum    Intrahepatic cholangiocarcinoma ([place]) 04/12/2021   Goals of care, counseling/discussion 04/12/2021   Mild episode of recurrent major depressive disorder ([place]) 02/08/2019   Dyslipidemia associated with type 2 diabetes mellitus ([place]) 12/17/2017   Noncompliance with medication regimen 12/17/2017   Spinal stenosis 10/15/2016   Diabetic polyneuropathy associated with type 2 diabetes mellitus ([place]) 07/14/2016   Chronic midline low back pain with sciatica 06/22/2016   Primary osteoarthritis of both knees 06/22/2016   Carpal tunnel syndrome 07/01/2015   Cervical radiculitis 07/01/2015   Corn or callus 07/01/2015   Impingement syndrome of shoulder 07/01/2015   Microalbuminuria 07/01/2015   Compulsive tobacco user syndrome 07/01/2015   Tenosynovitis of wrist 07/01/2015   Benign hypertension 11/05/2009   Uncontrolled type 2 diabetes mellitus with microalbuminuria ([place]) 11/05/2009   Athlete's foot 08/05/2009   Combined fat and  carbohydrate induced hyperlipemia 01/08/2009   Vitamin D deficiency 01/08/2009   Asthma, mild intermittent 12/28/2008   Allergic rhinitis 12/28/2008   Lumbosacral neuritis 08/11/2007    Past Surgical History:  Procedure Laterality Date   ABDOMINAL HYSTERECTOMY     COLONOSCOPY WITH PROPOFOL N/A 05/06/2021   Procedure: COLONOSCOPY WITH PROPOFOL;  Surgeon: Lucilla Lame, MD;  Location: [facility] ENDOSCOPY;  Service: Endoscopy;  Laterality: N/A;   ESOPHAGOGASTRODUODENOSCOPY (EGD) WITH PROPOFOL N/A 05/06/2021   Procedure: ESOPHAGOGASTRODUODENOSCOPY (EGD) WITH PROPOFOL;  Surgeon: Lucilla Lame, MD;   Location: [facility] ENDOSCOPY;  Service: Endoscopy;  Laterality: N/A;    Family History  Problem Relation Age of Onset   Heart attack Mother    CAD Mother    Kidney disease Father     Social History   Tobacco Use   Smoking status: Some Days    Packs/day: 0.25    Pack years: 0.00    Types: Cigarettes   Smokeless tobacco: Never   Tobacco comments:    2 per day 01/30/21  Substance Use Topics   Alcohol use: Yes    Alcohol/week: 0.0 standard drinks    Comment: rare maybe 1-2  year     Current Outpatient Medications:    albuterol (VENTOLIN HFA) 108 (90 Base) MCG/ACT inhaler, INHALE 2 PUFFS BY MOUTH EVERY 6 HOURS AS NEEDED FOR WHEEZING AND FOR SHORTNESS OF BREATH, Disp: 18 g, Rfl: 1   aspirin 81 MG tablet, Take 1 tablet by mouth daily., Disp: , Rfl:    atorvastatin (LIPITOR) 40 MG tablet, Take 1 tablet (40 mg total) by mouth at bedtime., Disp: 90 tablet, Rfl: 1   blood glucose meter kit and supplies, Dispense based on patient and insurance preference. Use up to four times daily as directed. (FOR ICD-10 E10.9, E11.9)., Disp: 1 each, Rfl: 0   dexamethasone (DECADRON) 4 MG tablet, Take 2 tablets (8 mg total) by mouth daily. Take daily x 3 days starting the day after cisplatin chemotherapy. Take with food. (Patient not taking: Reported on 05/26/2021), Disp: 30 tablet, Rfl: 1   diphenhydramine-acetaminophen (TYLENOL PM) 25-500 MG TABS tablet, Take 1 tablet by mouth at bedtime as needed., Disp: , Rfl:    Empagliflozin-metFORMIN HCl ER (SYNJARDY XR) 12.04-999 MG TB24, Take 2 tablets by mouth daily. New dose, Disp: 180 tablet, Rfl: 1   escitalopram (LEXAPRO) 10 MG tablet, Take 1 tablet (10 mg total) by mouth daily., Disp: 90 tablet, Rfl: 0   fluticasone (FLONASE) 50 MCG/ACT nasal spray, USE 2 SPRAY(S) IN EACH NOSTRIL AT BEDTIME (Patient taking differently: Place 2 sprays into both nostrils daily as needed. USE 2 SPRAY(S) IN EACH NOSTRIL AT BEDTIME), Disp: 16 g, Rfl: 0   Fluticasone-Umeclidin-Vilant  (TRELEGY ELLIPTA) 100-62.5-25 MCG/INH AEPB, Take 1 puff by mouth daily., Disp: 180 each, Rfl: 1   gabapentin (NEURONTIN) 300 MG capsule, Take 2 capsules (600 mg total) by mouth 2 (two) times daily., Disp: 360 capsule, Rfl: 1   Insulin Degludec (TRESIBA) 100 UNIT/ML SOLN, Inject 12-30 units of lipase into the skin daily. To keep fasting glucose between 100-140, Disp: 15 mL, Rfl: 0   Insulin Pen Needle 31G X 8 MM MISC, 1 each by Does not apply route daily., Disp: 100 each, Rfl: 1   lidocaine-prilocaine (EMLA) cream, Apply to affected area once (Patient not taking: Reported on 05/26/2021), Disp: 30 g, Rfl: 3   losartan (COZAAR) 25 MG tablet, Take 1 tablet (25 mg total) by mouth daily., Disp: 90 tablet, Rfl: 0   ondansetron (ZOFRAN)  8 MG tablet, Take 1 tablet (8 mg total) by mouth 2 (two) times daily as needed. Start on the third day after cisplatin chemotherapy. (Patient not taking: Reported on 05/26/2021), Disp: 30 tablet, Rfl: 1   prochlorperazine (COMPAZINE) 10 MG tablet, Take 1 tablet (10 mg total) by mouth every 6 (six) hours as needed (Nausea or vomiting). (Patient not taking: Reported on 05/26/2021), Disp: 30 tablet, Rfl: 1  Allergies  Allergen Reactions   Other Shortness Of Breath    Cat dander and grass pollen   Bydureon [Exenatide] Other (See Comments)    Pain with injection not allergy    I personally reviewed {Reviewed:14835} with the patient/caregiver today.   ROS  ***  Objective  There were no vitals filed for this visit.  There is no height or weight on file to calculate BMI.  Physical Exam ***  Recent Results (from the past 2160 hour(s))  Basic metabolic panel     Status: Abnormal   Collection Time: 03/24/21  3:25 AM  Result Value Ref Range   Sodium 136 135 - 145 mmol/L   Potassium 4.4 3.5 - 5.1 mmol/L   Chloride 104 98 - 111 mmol/L   CO2 20 (L) 22 - 32 mmol/L   Glucose, Bld 225 (H) 70 - 99 mg/dL    Comment: Glucose reference range applies only to samples taken  after fasting for at least 8 hours.   BUN 19 8 - 23 mg/dL   Creatinine, Ser 1.11 (H) 0.44 - 1.00 mg/dL   Calcium 9.4 8.9 - 10.3 mg/dL   GFR, Estimated 56 (L) >60 mL/min    Comment: (NOTE) Calculated using the CKD-EPI Creatinine Equation (2021)    Anion gap 12 5 - 15    Comment: Performed at [facility], [address] [place], [place] [address]  CBC     Status: Abnormal   Collection Time: 03/24/21  3:25 AM  Result Value Ref Range   WBC 11.9 (H) 4.0 - 10.5 K/uL   RBC 4.35 3.87 - 5.11 MIL/uL   Hemoglobin 13.7 12.0 - 15.0 g/dL   HCT 40.0 36.0 - 46.0 %   MCV 92.0 80.0 - 100.0 fL   MCH 31.5 26.0 - 34.0 pg   MCHC 34.3 30.0 - 36.0 g/dL   RDW 14.8 11.5 - 15.5 %   Platelets 313 150 - 400 K/uL   nRBC 0.0 0.0 - 0.2 %    Comment: Performed at [facility], [place], [place] [address]  Troponin I (High Sensitivity)     Status: None   Collection Time: 03/24/21  3:25 AM  Result Value Ref Range   Troponin I (High Sensitivity) 9 <18 ng/L    Comment: (NOTE) Elevated high sensitivity troponin I (hsTnI) values and significant  changes across serial measurements may suggest ACS but many other  chronic and acute conditions are known to elevate hsTnI results.  Refer to the "Links" section for chest pain algorithms and additional  guidance. Performed at [facility], [place], [place] [address]   Troponin I (High Sensitivity)     Status: None   Collection Time: 03/24/21  6:05 AM  Result Value Ref Range   Troponin I (High Sensitivity) 8 <18 ng/L    Comment: (NOTE) Elevated high sensitivity troponin I (hsTnI) values and significant  changes across serial measurements may suggest ACS but many other  chronic and acute conditions are known to elevate hsTnI results.  Refer to the "  Links" section for chest pain algorithms and additional  guidance. Performed at [facility], [address] [place], [place] [address]   Hepatic  function panel     Status: Abnormal   Collection Time: 03/24/21  6:05 AM  Result Value Ref Range   Total Protein 8.8 (H) 6.5 - 8.1 g/dL   Albumin 3.5 3.5 - 5.0 g/dL   AST 35 15 - 41 U/L   ALT 41 0 - 44 U/L   Alkaline Phosphatase 619 (H) 38 - 126 U/L   Total Bilirubin 2.0 (H) 0.3 - 1.2 mg/dL   Bilirubin, Direct 0.7 (H) 0.0 - 0.2 mg/dL   Indirect Bilirubin 1.3 (H) 0.3 - 0.9 mg/dL    Comment: Performed at [facility], [address] [place], [place] [address]  Lipase, blood     Status: None   Collection Time: 03/24/21  6:05 AM  Result Value Ref Range   Lipase 46 11 - 51 U/L    Comment: Performed at [facility], [address] [place], [place] [address]  Ethanol     Status: None   Collection Time: 03/24/21  6:05 AM  Result Value Ref Range   Alcohol, Ethyl (B) <10 <10 mg/dL    Comment: (NOTE) Lowest detectable limit for serum alcohol is 10 mg/dL.  For medical purposes only. Performed at [facility], [address] [place],  [address]   Resp Panel by RT-PCR (Flu A&B, Covid) Nasopharyngeal Swab     Status: None   Collection Time: 03/24/21  7:44 AM   Specimen: Nasopharyngeal Swab; Nasopharyngeal(NP) swabs in vial transport medium  Result Value Ref Range   SARS Coronavirus 2 by RT PCR NEGATIVE NEGATIVE    Comment: (NOTE) SARS-CoV-2 target nucleic acids are NOT DETECTED.  The SARS-CoV-2 RNA is generally detectable in upper respiratory specimens during the acute phase of infection. The lowest concentration of SARS-CoV-2 viral copies this assay can detect is 138 copies/mL. A negative result does not preclude SARS-Cov-2 infection and should not be used as the sole basis for treatment or other patient management decisions. A negative result may occur with  improper specimen collection/handling, submission of specimen other than nasopharyngeal swab, presence of viral mutation(s) within the areas targeted by this assay, and inadequate number of  viral copies(<138 copies/mL). A negative result must be combined with clinical observations, patient history, and epidemiological information. The expected result is Negative.  Fact Sheet for Patients:  EntrepreneurPulse.com.au  Fact Sheet for Healthcare Providers:  IncredibleEmployment.be  This test is no t yet approved or cleared by the [place] FDA and  has been authorized for detection and/or diagnosis of SARS-CoV-2 by FDA under an Emergency Use Authorization (EUA). This EUA will remain  in effect (meaning this test can be used) for the duration of the COVID-19 declaration under Section 564(b)(1) of the Act, 21 U.S.C.section 360bbb-3(b)(1), unless the authorization is terminated  or revoked sooner.       Influenza A by PCR NEGATIVE NEGATIVE   Influenza B by PCR NEGATIVE NEGATIVE    Comment: (NOTE) The Xpert Xpress SARS-CoV-2/FLU/RSV plus assay is intended as an aid in the diagnosis of influenza from Nasopharyngeal swab specimens and should not be used as a sole basis for treatment. Nasal washings and aspirates are unacceptable for Xpert Xpress SARS-CoV-2/FLU/RSV testing.  Fact Sheet for Patients: EntrepreneurPulse.com.au  Fact Sheet for Healthcare Providers: IncredibleEmployment.be  This test is not yet approved or cleared by the [place] FDA and has been  authorized for detection and/or diagnosis of SARS-CoV-2 by FDA under an Emergency Use Authorization (EUA). This EUA will remain in effect (meaning this test can be used) for the duration of the COVID-19 declaration under Section 564(b)(1) of the Act, 21 U.S.C. section 360bbb-3(b)(1), unless the authorization is terminated or revoked.  Performed at [facility], [address] [place], [place] [address]   CEA     Status: Abnormal   Collection Time: 03/24/21 10:28 AM  Result Value Ref Range   CEA 5.3 (H) 0.0 - 4.7 ng/mL     Comment: (NOTE)                             Nonsmokers          <3.9                             Smokers             <5.6 Roche Diagnostics Electrochemiluminescence Immunoassay (ECLIA) Values obtained with different assay methods or kits cannot be used interchangeably.  Results cannot be interpreted as absolute evidence of the presence or absence of malignant disease. Performed At: [facility] [place], [place] [id number] Rush Farmer MD QI:[id number]   CA 19-9 (SERIAL)     Status: None   Collection Time: 03/24/21 10:28 AM  Result Value Ref Range   CA 19-9 <2 0 - 35 U/mL    Comment: (NOTE) Roche Diagnostics Electrochemiluminescence Immunoassay (ECLIA) Values obtained with different assay methods or kits cannot be used interchangeably.  Results cannot be interpreted as absolute evidence of the presence or absence of malignant disease. Performed At: [facility] [place], [place] [id number] Rush Farmer MD JJ:[id number]   AFP tumor marker     Status: None   Collection Time: 03/24/21 10:28 AM  Result Value Ref Range   AFP, Serum, Tumor Marker 4.9 0.0 - 9.2 ng/mL    Comment: (NOTE) Roche Diagnostics Electrochemiluminescence Immunoassay (ECLIA) Values obtained with different assay methods or kits cannot be used interchangeably.  Results cannot be interpreted as absolute evidence of the presence or absence of malignant disease. This test is not interpretable in pregnant females. Performed At: [facility] [place], [place] [id number] Rush Farmer MD FT:[id number]   CBC     Status: Abnormal   Collection Time: 04/02/21 10:23 AM  Result Value Ref Range   WBC 10.4 4.0 - 10.5 K/uL    Comment: WHITE COUNT CONFIRMED ON SMEAR   RBC 4.37 3.87 - 5.11 MIL/uL   Hemoglobin 13.7 12.0 - 15.0 g/dL   HCT 40.3 36.0 - 46.0 %   MCV 92.2 80.0 - 100.0 fL   MCH 31.4 26.0 - 34.0 pg   MCHC 34.0 30.0 - 36.0 g/dL   RDW 15.6  (H) 11.5 - 15.5 %   Platelets 322 150 - 400 K/uL   nRBC 0.0 0.0 - 0.2 %    Comment: Performed at [facility], [address] [place], [place] [address]  Protime-INR     Status: None   Collection Time: 04/02/21 10:23 AM  Result Value Ref Range   Prothrombin Time 13.3 11.4 - 15.2 seconds   INR 1.0 0.8 - 1.2    Comment: (NOTE) INR goal varies based on device and disease states. Performed at [facility], [address] [place], [place] [address]  Glucose, capillary     Status: None   Collection Time: 04/02/21 10:23 AM  Result Value Ref Range   Glucose-Capillary 80 70 - 99 mg/dL    Comment: Glucose reference range applies only to samples taken after fasting for at least 8 hours.  Surgical pathology     Status: None   Collection Time: 04/02/21 11:58 AM  Result Value Ref Range   SURGICAL PATHOLOGY      SURGICAL PATHOLOGY **** THIS IS AN ADDENDUM REPORT **** CASE: [id number] PATIENT: Keyetta Middleton Surgical Pathology Report **********Addendum **********  Reason for Addendum #1:  Additional special stains Reason for Addendum #2:  Additional special stains  Specimen Submitted: A. Liver, right  Clinical History: Multiple liver lesions with mild elevation of CEA. AFP and CA19-9 are within normal limits.      DIAGNOSIS: A.  LIVER, RIGHT; ULTRASOUND-GUIDED BIOPSY: - POORLY DIFFERENTIATED ADENOCARCINOMA WITH NECROSIS.  Comment: The sections demonstrate cores of poorly differentiated carcinoma arranged in sheets, trabeculae, and with focal gland formation containing necrotic debris.  Areas of tumor necrosis are present.  A limited panel of immunohistochemical stains was performed with the carcinoma demonstrating the following pattern of immunoreactivity: Cytokeratin 7: Positive Cytokeratin 20: Negative HepPar1: Negative MOC-31: Positive GATA-3: Negative TTF-1 : Negative CDX2: Negative Based on the pattern of immunoreactivity the differential  includes primary/metastatic cholangiocarcinoma, metastatic upper GI (stomach), and metastatic pancreatic carcinoma. Metastatic lung, breast, and colorectal carcinoma are considered less likely. Results for CK19 and arginase IHC will be resulted in an addendum.  There is sufficient material for ancillary molecular testing if needed (A1, A2, A3).  IHC slides were prepared by [facility], [place], [place]. All controls stained appropriately.  This test was developed and its performance characteristics determined by [facility]. It has not been cleared or approved by the [place] Food and Drug Administration. The FDA does not require this test to go through premarket FDA review. This test is used for clinical purposes. It should not be regarded as investigational or for research. This laboratory is certified under the Clinical Laboratory Improvement Amendments (CLIA) a s qualified to perform high complexity clinical laboratory testing.   GROSS DESCRIPTION: A. Labeled: Right liver mass Received: Formalin Collection time: 11:41 AM on 04/02/2021 Placed into formalin time: 11:41 AM on 04/02/2021 Number of needle core biopsy(s): 5 cores and 1 additional fragment Length: Range from 0.8 to 1 cm Diameter: 0.1 cm Description: Received are cores and a fragment of tan soft tissue.  The fragment is 0.2 x 0.1 x 0.1 cm. Ink: None Entirely submitted in cassettes 1-3 with 2 cores in cassette 1, 2 cores in cassette 2, and 1 core with the remaining fragment in cassette 3.  RB 04/02/2021  Final Diagnosis performed by Quay Burow, MD.   Electronically signed 04/04/2021 1:37:51PM The electronic signature indicates that the named Attending Pathologist has evaluated the specimen Technical component performed at [facility], [address] [place], [place] [address] Lab: [phone] Dir: Rush Farmer, MD, MMM  Professional component performed at [facility],  [facility], [place], [place], [place] [address] Lab: [phone] Dir: Dellia Nims. Rubinas, MD  ADDENDUM: Additional immunohistochemical stains were performed with the following results: Cytokeratin 19: Positive PAX-8: Negative The histologic findings and pattern of immunoreactivity would be compatible with cholangiocarcinoma in the proper clinical context.  CK19 IHC slides were prepared by [place] [facility] [employer], [place], [place]. PAX-8 IHC slides were prepared by [facility], [place], [place]. All controls stained appropriately.  This test was developed and its performance characteristics determined by [facility]. It has not been cleared or approved by the [place] Food and Drug Administration. The FDA does not require this test to go through premarket FDA review. This test is used for clinical purposes. It should not be regarded as investigational or for research. This laboratory is c ertified under the Clinical Laboratory Improvement Amendments (CLIA) as qualified to perform high complexity clinical laboratory testing. IHC slides were prepared by [place] [facility] [employer], [place], [place]. All controls stained appropriately.  This test was developed and its performance characteristics determined by [facility]. It has not been cleared or approved by the [place] Food and Drug Administration. The FDA does not require this test to go through premarket FDA review. This test is used for clinical purposes. It should not be regarded as investigational or for research. This laboratory is certified under the Clinical Laboratory Improvement Amendments (CLIA) as qualified to perform high complexity clinical laboratory testing.         Addendum #1 performed by Quay Burow, MD.   Electronically signed 04/10/2021 8:33:41AM The electronic signature indicates that the named Attending Pathologist has  evaluated the specimen Technical compon ent performed at [facility], [address] [place], [place] [address] Lab: [phone] Dir: Rush Farmer, MD, MMM  Professional component performed at [facility], [facility], [place], [place], [place] [address] Lab: [phone] Dir: Dellia Nims. Rubinas, MD  ADDENDUM: An arginase-1 stain is negative. The histologic findings and pattern of immunoreactivity would be compatible with cholangiocarcinoma in the proper clinical setting.  IHC slides were prepared by [place] [facility] [employer], [place], [place] All controls stained appropriately.  This test was developed and its performance characteristics determined by [facility]. It has not been cleared or approved by the [place] Food and Drug Administration. The FDA does not require this test to go through premarket FDA review. This test is used for clinical purposes. It should not be regarded as investigational or for research. This laboratory is certified under the [place] y Improvement Amendments (CLIA) as qualified to perform high complexity clinical laboratory testing.        Addendum #2 performed by Quay Burow, MD.   Electronically signed 04/10/2021 2:40:38PM The electronic signature indicates that the named Attending Pathologist has evaluated the specimen Technical component performed at [facility], [address], [place], [place] [address] Lab: [phone] Dir: Rush Farmer, MD, MMM  Professional component performed at [facility], [facility], [place], [place], [place] [address] Lab: [phone] Dir: Dellia Nims. Rubinas, MD   Glucose, capillary     Status: Abnormal   Collection Time: 04/16/21 10:53 AM  Result Value Ref Range   Glucose-Capillary 148 (H) 70 - 99 mg/dL    Comment: Glucose reference range applies only to samples taken after fasting for at least 8 hours.  TSH     Status: None   Collection Time:  04/21/21  8:42 AM  Result Value Ref Range   TSH 2.558 0.350 - 4.500 uIU/mL    Comment: Performed by a 3rd Generation assay with a functional sensitivity of <=0.01 uIU/mL. Performed at [facility], [address] [place], [place] [address]   Hepatitis B surface antibody     Status: Abnormal   Collection Time: 04/21/21  8:42 AM  Result Value Ref Range   Hepatitis B-Post <3.1 (L) Immunity>9.9 mIU/mL    Comment: (NOTE)  Status of Immunity  Anti-HBs Level  ------------------                     -------------- Inconsistent with Immunity                   0.0 - 9.9 Consistent with Immunity                          >9.9 Performed At: [facility] [place], [place] [id number] Rush Farmer MD WG:[id number]   Hepatitis C antibody     Status: None   Collection Time: 04/21/21  8:42 AM  Result Value Ref Range   HCV Ab NON REACTIVE NON REACTIVE    Comment: (NOTE) Nonreactive HCV antibody screen is consistent with no HCV infections,  unless recent infection is suspected or other evidence exists to indicate HCV infection.  Performed at [facility], [place] [address], [place], [place] [address]   Hepatitis B core antibody, total     Status: None   Collection Time: 04/21/21  8:42 AM  Result Value Ref Range   Hep B Core Total Ab NON REACTIVE NON REACTIVE    Comment: Performed at [facility] [address] [place], [place] [address]  Hepatitis B surface antigen     Status: None   Collection Time: 04/21/21  8:42 AM  Result Value Ref Range   Hepatitis B Surface Ag NON REACTIVE NON REACTIVE    Comment: Performed at [facility] [address] [place], [place] [address]  HIV ANTIBODY (ROUTINE TETSING W RELFEX)     Status: None   Collection Time: 04/21/21  8:42 AM  Result Value Ref Range   HIV Screen 4th Generation wRfx Non Reactive Non Reactive    Comment: Performed at [facility], [address] ,   [address]  Comprehensive metabolic panel     Status: Abnormal   Collection Time: 04/21/21  8:42 AM  Result Value Ref Range   Sodium 135 135 - 145 mmol/L   Potassium 3.7 3.5 - 5.1 mmol/L   Chloride 101 98 - 111 mmol/L   CO2 23 22 - 32 mmol/L   Glucose, Bld 138 (H) 70 - 99 mg/dL    Comment: Glucose reference range applies only to samples taken after fasting for at least 8 hours.   BUN 16 8 - 23 mg/dL   Creatinine, Ser 1.08 (H) 0.44 - 1.00 mg/dL   Calcium 9.0 8.9 - 10.3 mg/dL   Total Protein 8.9 (H) 6.5 - 8.1 g/dL   Albumin 3.3 (L) 3.5 - 5.0 g/dL   AST 55 (H) 15 - 41 U/L   ALT 62 (H) 0 - 44 U/L   Alkaline Phosphatase 733 (H) 38 - 126 U/L   Total Bilirubin 2.6 (H) 0.3 - 1.2 mg/dL   GFR, Estimated 58 (L) >60 mL/min    Comment: (NOTE) Calculated using the CKD-EPI Creatinine Equation (2021)    Anion gap 11 5 - 15    Comment: Performed at [facility], [place] [place],  [address]  CBC with Differential     Status: Abnormal   Collection Time: 04/21/21  8:42 AM  Result Value Ref Range   WBC 11.6 (H) 4.0 - 10.5 K/uL    Comment: WHITE COUNT CONFIRMED ON SMEAR   RBC 3.99 3.87 - 5.11 MIL/uL   Hemoglobin 12.6 12.0 - 15.0 g/dL   HCT  37.7 36.0 - 46.0 %   MCV 94.5 80.0 - 100.0 fL   MCH 31.6 26.0 - 34.0 pg   MCHC 33.4 30.0 - 36.0 g/dL   RDW 15.8 (H) 11.5 - 15.5 %   Platelets 321 150 - 400 K/uL   nRBC 0.0 0.0 - 0.2 %   Neutrophils Relative % 63 %   Neutro Abs 7.4 1.7 - 7.7 K/uL   Lymphocytes Relative 26 %   Lymphs Abs 3.0 0.7 - 4.0 K/uL   Monocytes Relative 7 %   Monocytes Absolute 0.8 0.1 - 1.0 K/uL   Eosinophils Relative 3 %   Eosinophils Absolute 0.3 0.0 - 0.5 K/uL   Basophils Relative 1 %   Basophils Absolute 0.1 0.0 - 0.1 K/uL   WBC Morphology      DIFF CONFIRMED BY MANUAL. FEW LARGE GRANULAR LYMPHOCYTES NOTED   RBC Morphology MIXED RBC POPULATION WITH FEW TARGET CELLS NOTED    Smear Review Normal platelet morphology     Comment: PLATELETS APPEAR ADEQUATE    Immature Granulocytes 0 %   Abs Immature Granulocytes 0.05 0.00 - 0.07 K/uL    Comment: Performed at [facility], [place], [place] [address]  POCT HgB A1C     Status: Normal   Collection Time: 04/29/21  3:08 PM  Result Value Ref Range   Hemoglobin A1C 4.8 4.0 - 5.6 %   HbA1c POC (<> result, manual entry)     HbA1c, POC (prediabetic range)     HbA1c, POC (controlled diabetic range)    Glucose, capillary     Status: Abnormal   Collection Time: 05/06/21  7:32 AM  Result Value Ref Range   Glucose-Capillary 180 (H) 70 - 99 mg/dL    Comment: Glucose reference range applies only to samples taken after fasting for at least 8 hours.  Surgical pathology     Status: None   Collection Time: 05/06/21  8:51 AM  Result Value Ref Range   SURGICAL PATHOLOGY      SURGICAL PATHOLOGY CASE: [id number] PATIENT: Makenna Rodier Surgical Pathology Report     Specimen Submitted: A. Duodenum polyp; cold snare B. Colon polyp x2, sigmoid; cold snare C. Rectum polyp; cold snare  Clinical History: Screening Z12.11 GI cancer C26.9.  Gastritis, duodenal polyp; colon polyps      DIAGNOSIS: A. DUODENUM POLYP; COLD SNARE: - DUODENAL ADENOMA. - NEGATIVE FOR HIGH-GRADE DYSPLASIA AND MALIGNANCY.  B. COLON POLYP X2, SIGMOID; COLD SNARE: - TUBULAR ADENOMA, MULTIPLE FRAGMENTS. - NEGATIVE FOR HIGH-GRADE DYSPLASIA AND MALIGNANCY.  C. RECTUM POLYP; COLD SNARE: - TUBULAR ADENOMA. - NEGATIVE FOR HIGH-GRADE DYSPLASIA AND MALIGNANCY.   GROSS DESCRIPTION: A. Labeled: Cold snare duodenum polyp Received: Formalin Collection time: 8:51 AM on 05/06/2021 Placed into formalin time: 8:51 AM on 05/06/2021 Tissue fragment(s): Multiple Size: Aggregate, 1.1 x 0.6 x 0.2 cm Description: Tan soft tissue fragments Entirely submitted in 1 cassette.  B . Labeled: Cold snare sigmoid colon polyp x2 Received: Formalin Collection time: 9:04 AM on 05/06/2021 Placed into formalin time: 9:04 AM on  05/06/2021 Tissue fragment(s): Multiple Size: Aggregate, 2.5 x 1 x 0.1 cm Description: Received are at least 2 fragments of tan soft tissue admixed with intestinal debris.  The ratio of soft tissue to intestinal debris is 30: 70. Entirely submitted in 1 cassette.  C. Labeled: Cold snare rectal polyp Received: Formalin Collection time: 9:13 AM on 05/06/2021 Placed into formalin time: 9:13 AM on 05/06/2021 Tissue fragment(s): 2 Size: Range from 0.3-0.6  cm Description: Received are 2 fragments of tan soft tissue.  The smaller fragment is translucent and may not survive processing. Entirely submitted in 1 cassette.  RB 05/06/2021  Final Diagnosis performed by Betsy Pries, MD.   Electronically signed 05/07/2021 12:15:20PM The electronic signature indicates that the named Attending Pathologist has evaluated the specimen Technical  component performed at [facility], [address], [place], [place] [address] Lab: [phone] Dir: Rush Farmer, MD, MMM  Professional component performed at [facility], [facility], [place], [place], [place] [address] Lab: [phone] Dir: Dellia Nims. Rubinas, MD   CBC with Differential     Status: Abnormal   Collection Time: 05/26/21  8:21 AM  Result Value Ref Range   WBC 9.3 4.0 - 10.5 K/uL    Comment: WHITE COUNT CONFIRMED ON SMEAR   RBC 3.24 (L) 3.87 - 5.11 MIL/uL   Hemoglobin 10.3 (L) 12.0 - 15.0 g/dL   HCT 31.5 (L) 36.0 - 46.0 %   MCV 97.2 80.0 - 100.0 fL   MCH 31.8 26.0 - 34.0 pg   MCHC 32.7 30.0 - 36.0 g/dL   RDW 14.8 11.5 - 15.5 %   Platelets 366 150 - 400 K/uL   nRBC 0.0 0.0 - 0.2 %   Neutrophils Relative % 69 %   Neutro Abs 6.4 1.7 - 7.7 K/uL   Lymphocytes Relative 20 %   Lymphs Abs 1.8 0.7 - 4.0 K/uL   Monocytes Relative 7 %   Monocytes Absolute 0.7 0.1 - 1.0 K/uL   Eosinophils Relative 3 %   Eosinophils Absolute 0.3 0.0 - 0.5 K/uL   Basophils Relative 1 %   Basophils Absolute 0.1 0.0 - 0.1 K/uL   WBC Morphology  DIFF CONFIRMED BY MANUAL    RBC Morphology UNREMARKABLE    Smear Review PLATELETS APPEAR ADEQUATE     Comment: PLATELETS VARY IN SIZE WITH OCC LARGE AND GIANT PLATELETS NOTED.   Immature Granulocytes 0 %   Abs Immature Granulocytes 0.03 0.00 - 0.07 K/uL    Comment: Performed at [facility], [address] [place], [place] [address]  Comprehensive metabolic panel     Status: Abnormal   Collection Time: 05/26/21  8:21 AM  Result Value Ref Range   Sodium 133 (L) 135 - 145 mmol/L   Potassium 4.7 3.5 - 5.1 mmol/L   Chloride 101 98 - 111 mmol/L   CO2 23 22 - 32 mmol/L   Glucose, Bld 208 (H) 70 - 99 mg/dL    Comment: Glucose reference range applies only to samples taken after fasting for at least 8 hours.   BUN 28 (H) 8 - 23 mg/dL   Creatinine, Ser 1.24 (H) 0.44 - 1.00 mg/dL   Calcium 8.9 8.9 - 10.3 mg/dL   Total Protein 8.6 (H) 6.5 - 8.1 g/dL   Albumin 2.8 (L) 3.5 - 5.0 g/dL   AST 87 (H) 15 - 41 U/L   ALT 79 (H) 0 - 44 U/L   Alkaline Phosphatase 958 (H) 38 - 126 U/L   Total Bilirubin 2.1 (H) 0.3 - 1.2 mg/dL   GFR, Estimated 49 (L) >60 mL/min    Comment: (NOTE) Calculated using the CKD-EPI Creatinine Equation (2021)    Anion gap 9 5 - 15    Comment: Performed at [facility], [address], [place], [place] [address]  Magnesium     Status: Abnormal   Collection Time: 05/26/21  8:21 AM  Result Value Ref Range   Magnesium  2.8 (H) 1.7 - 2.4 mg/dL    Comment: Performed at [facility], [address] [place], [place] [address]    Diabetic Foot Exam: Diabetic Foot Exam - Simple   No data filed    ***  PHQ2/9: Depression screen [facility] 2/9 05/02/2021 04/29/2021 01/30/2021 12/26/2020 02/15/2020  Decreased Interest 1 1 0 0 1  Down, Depressed, Hopeless 1 1 0 0 1  PHQ - 2 Score 2 2 0 0 2  Altered sleeping 0 0 - 0 0  Tired, decreased energy 0 0 - 0 0  Change in appetite 1 1 - 0 3  Feeling bad or failure about yourself  0 0 - 0 0  Trouble concentrating 0 0 - 0 0   Moving slowly or fidgety/restless 0 0 - 0 0  Suicidal thoughts 0 0 - 0 0  PHQ-9 Score 3 3 - 0 5  Difficult doing work/chores - Not difficult at all - - -  Some recent data might be hidden    phq 9 is {gen pos [id number]} ***  Fall Risk: Fall Risk  04/29/2021 01/30/2021 12/26/2020 02/15/2020 01/30/2020  Falls in the past year? 0 0 0 0 0  Number falls in past yr: 0 0 0 0 0  Injury with Fall? 0 0 0 0 0  Comment - - - - -  Risk for fall due to : - No Fall Risks - - No Fall Risks  Follow up Falls prevention discussed Falls prevention discussed - Falls evaluation completed Falls prevention discussed   ***   Functional Status Survey:   ***   Assessment & Plan  *** There are no diagnoses linked to this encounter.

## 2021-06-03 ENCOUNTER — Ambulatory Visit: Payer: Medicare HMO | Admitting: Family Medicine

## 2021-06-04 NOTE — Chronic Care Management (AMB) (Signed)
  Care Management   Note  06/04/2021 Name: Janice Short MRN: [id number] DOB: [birth year]  Janice Short is a 60 y.o. year old female who is a primary care patient of Steele Sizer, MD and is actively engaged with the care management team. I reached out to [name] by phone today to assist with re-scheduling a follow up visit with the Licensed Clinical Social Worker  Follow up plan: Telephone appointment with care management team member scheduled for:06/20/2021  [facility]

## 2021-06-05 ENCOUNTER — Telehealth (INDEPENDENT_AMBULATORY_CARE_PROVIDER_SITE_OTHER): Payer: Self-pay

## 2021-06-05 NOTE — Telephone Encounter (Signed)
Spoke with the patient and she is scheduled with Dr. Lucky Cowboy for a port placement on 06/12/21 with a 9:30 am arrival time to the MM. Pre-procedure instructions were discussed and will be mailed.

## 2021-06-11 ENCOUNTER — Other Ambulatory Visit (INDEPENDENT_AMBULATORY_CARE_PROVIDER_SITE_OTHER): Payer: Self-pay | Admitting: Nurse Practitioner

## 2021-06-12 ENCOUNTER — Encounter: Admission: RE | Payer: Self-pay | Source: Home / Self Care

## 2021-06-12 ENCOUNTER — Ambulatory Visit: Admission: RE | Admit: 2021-06-12 | Payer: Medicaid Other | Source: Home / Self Care | Admitting: Vascular Surgery

## 2021-06-12 SURGERY — PORTA CATH INSERTION
Anesthesia: Moderate Sedation

## 2021-06-12 NOTE — Telephone Encounter (Signed)
Sent pt a mychart message again to request a return call to schedule follow up appt to discuss recent procedure results.

## 2021-06-13 ENCOUNTER — Other Ambulatory Visit: Payer: Self-pay

## 2021-06-13 ENCOUNTER — Inpatient Hospital Stay: Payer: Medicare HMO | Attending: Hospice and Palliative Medicine | Admitting: Hospice and Palliative Medicine

## 2021-06-13 DIAGNOSIS — Z515 Encounter for palliative care: Secondary | ICD-10-CM

## 2021-06-13 DIAGNOSIS — Z5189 Encounter for other specified aftercare: Secondary | ICD-10-CM | POA: Insufficient documentation

## 2021-06-13 DIAGNOSIS — R5383 Other fatigue: Secondary | ICD-10-CM | POA: Insufficient documentation

## 2021-06-13 DIAGNOSIS — C221 Intrahepatic bile duct carcinoma: Secondary | ICD-10-CM | POA: Insufficient documentation

## 2021-06-13 DIAGNOSIS — E785 Hyperlipidemia, unspecified: Secondary | ICD-10-CM | POA: Insufficient documentation

## 2021-06-13 DIAGNOSIS — Z841 Family history of disorders of kidney and ureter: Secondary | ICD-10-CM | POA: Insufficient documentation

## 2021-06-13 DIAGNOSIS — Z79899 Other long term (current) drug therapy: Secondary | ICD-10-CM | POA: Insufficient documentation

## 2021-06-13 DIAGNOSIS — I10 Essential (primary) hypertension: Secondary | ICD-10-CM | POA: Insufficient documentation

## 2021-06-13 DIAGNOSIS — E119 Type 2 diabetes mellitus without complications: Secondary | ICD-10-CM | POA: Insufficient documentation

## 2021-06-13 DIAGNOSIS — D509 Iron deficiency anemia, unspecified: Secondary | ICD-10-CM | POA: Insufficient documentation

## 2021-06-13 DIAGNOSIS — Z5111 Encounter for antineoplastic chemotherapy: Secondary | ICD-10-CM | POA: Insufficient documentation

## 2021-06-13 DIAGNOSIS — R7989 Other specified abnormal findings of blood chemistry: Secondary | ICD-10-CM | POA: Insufficient documentation

## 2021-06-13 DIAGNOSIS — Z8249 Family history of ischemic heart disease and other diseases of the circulatory system: Secondary | ICD-10-CM | POA: Insufficient documentation

## 2021-06-13 NOTE — Progress Notes (Signed)
Virtual Visit via Telephone Note  I connected with Janice Short on 06/13/21 at  1:30 PM EDT by telephone and verified that I am speaking with the correct person using two identifiers.  Location: Patient: Home Provider: Clinic   I discussed the limitations, risks, security and privacy concerns of performing an evaluation and management service by telephone and the availability of in person appointments. I also discussed with the patient that there may be a patient responsible charge related to this service. The patient expressed understanding and agreed to proceed.   History of Present Illness: Janice Short is a 60 y.o. female with multiple medical problems including hypertension, hyperlipidemia, and type 2 diabetes, who was recently diagnosed with cholangiocarcinoma.  Patient was initially unsure regarding treatment options.  She was referred to palliative care to help address goals.   Observations/Objective: I called and spoke with patient by phone.  She reports that she is doing well.  She denies any changes or concerns.  No symptomatic complaints at present.  No issues with medications nor need for refills.  Assessment and Plan: Stage IV cholangiocarcinoma -on systemic chemotherapy.  Seems to be doing well clinically without any significant symptomatic burden at present.  Will follow.  Follow Up Instructions: Follow-up MyChart 1 to 2 months   I discussed the assessment and treatment plan with the patient. The patient was provided an opportunity to ask questions and all were answered. The patient agreed with the plan and demonstrated an understanding of the instructions.   The patient was advised to call back or seek an in-person evaluation if the symptoms worsen or if the condition fails to improve as anticipated.  I provided 5 minutes of non-face-to-face time during this encounter.   Irean Hong, NP

## 2021-06-16 ENCOUNTER — Inpatient Hospital Stay: Payer: Medicare HMO

## 2021-06-16 ENCOUNTER — Other Ambulatory Visit: Payer: Self-pay

## 2021-06-16 ENCOUNTER — Other Ambulatory Visit: Payer: Medicare HMO

## 2021-06-16 ENCOUNTER — Encounter: Payer: Self-pay | Admitting: Oncology

## 2021-06-16 ENCOUNTER — Inpatient Hospital Stay (HOSPITAL_BASED_OUTPATIENT_CLINIC_OR_DEPARTMENT_OTHER): Payer: Medicare HMO | Admitting: Oncology

## 2021-06-16 VITALS — BP 135/71 | HR 73 | Resp 16

## 2021-06-16 VITALS — BP 128/75 | HR 76 | Temp 96.1°F | Resp 20 | Wt 146.2 lb

## 2021-06-16 DIAGNOSIS — E119 Type 2 diabetes mellitus without complications: Secondary | ICD-10-CM | POA: Diagnosis not present

## 2021-06-16 DIAGNOSIS — Z79899 Other long term (current) drug therapy: Secondary | ICD-10-CM | POA: Diagnosis not present

## 2021-06-16 DIAGNOSIS — Z8249 Family history of ischemic heart disease and other diseases of the circulatory system: Secondary | ICD-10-CM | POA: Diagnosis not present

## 2021-06-16 DIAGNOSIS — Z5189 Encounter for other specified aftercare: Secondary | ICD-10-CM | POA: Diagnosis not present

## 2021-06-16 DIAGNOSIS — C221 Intrahepatic bile duct carcinoma: Secondary | ICD-10-CM

## 2021-06-16 DIAGNOSIS — I10 Essential (primary) hypertension: Secondary | ICD-10-CM | POA: Diagnosis not present

## 2021-06-16 DIAGNOSIS — E785 Hyperlipidemia, unspecified: Secondary | ICD-10-CM | POA: Diagnosis not present

## 2021-06-16 DIAGNOSIS — Z5111 Encounter for antineoplastic chemotherapy: Secondary | ICD-10-CM | POA: Diagnosis not present

## 2021-06-16 DIAGNOSIS — R7989 Other specified abnormal findings of blood chemistry: Secondary | ICD-10-CM | POA: Diagnosis not present

## 2021-06-16 DIAGNOSIS — Z841 Family history of disorders of kidney and ureter: Secondary | ICD-10-CM | POA: Diagnosis not present

## 2021-06-16 DIAGNOSIS — D649 Anemia, unspecified: Secondary | ICD-10-CM | POA: Diagnosis not present

## 2021-06-16 DIAGNOSIS — D509 Iron deficiency anemia, unspecified: Secondary | ICD-10-CM | POA: Diagnosis not present

## 2021-06-16 DIAGNOSIS — R5383 Other fatigue: Secondary | ICD-10-CM | POA: Diagnosis not present

## 2021-06-16 LAB — CBC WITH DIFFERENTIAL/PLATELET
Abs Immature Granulocytes: 0.1 10*3/uL — ABNORMAL HIGH (ref 0.00–0.07)
Basophils Absolute: 0.2 10*3/uL — ABNORMAL HIGH (ref 0.0–0.1)
Basophils Relative: 3 %
Eosinophils Absolute: 0.3 10*3/uL (ref 0.0–0.5)
Eosinophils Relative: 4 %
HCT: 33.8 % — ABNORMAL LOW (ref 36.0–46.0)
Hemoglobin: 10.9 g/dL — ABNORMAL LOW (ref 12.0–15.0)
Lymphocytes Relative: 18 %
Lymphs Abs: 1.3 10*3/uL (ref 0.7–4.0)
MCH: 32.4 pg (ref 26.0–34.0)
MCHC: 32.2 g/dL (ref 30.0–36.0)
MCV: 100.6 fL — ABNORMAL HIGH (ref 80.0–100.0)
Monocytes Absolute: 0.7 10*3/uL (ref 0.1–1.0)
Monocytes Relative: 10 %
Myelocytes: 1 %
Neutro Abs: 4.5 10*3/uL (ref 1.7–7.7)
Neutrophils Relative %: 64 %
Platelets: 500 10*3/uL — ABNORMAL HIGH (ref 150–400)
RBC: 3.36 MIL/uL — ABNORMAL LOW (ref 3.87–5.11)
RDW: 16.9 % — ABNORMAL HIGH (ref 11.5–15.5)
Smear Review: INCREASED
WBC: 7.1 10*3/uL (ref 4.0–10.5)
nRBC: 0 % (ref 0.0–0.2)

## 2021-06-16 LAB — MAGNESIUM: Magnesium: 2.6 mg/dL — ABNORMAL HIGH (ref 1.7–2.4)

## 2021-06-16 LAB — COMPREHENSIVE METABOLIC PANEL
ALT: 78 U/L — ABNORMAL HIGH (ref 0–44)
AST: 93 U/L — ABNORMAL HIGH (ref 15–41)
Albumin: 2.8 g/dL — ABNORMAL LOW (ref 3.5–5.0)
Alkaline Phosphatase: 916 U/L — ABNORMAL HIGH (ref 38–126)
Anion gap: 3 — ABNORMAL LOW (ref 5–15)
BUN: 19 mg/dL (ref 8–23)
CO2: 24 mmol/L (ref 22–32)
Calcium: 8.7 mg/dL — ABNORMAL LOW (ref 8.9–10.3)
Chloride: 104 mmol/L (ref 98–111)
Creatinine, Ser: 1.1 mg/dL — ABNORMAL HIGH (ref 0.44–1.00)
GFR, Estimated: 57 mL/min — ABNORMAL LOW (ref >60)
Glucose, Bld: 233 mg/dL — ABNORMAL HIGH (ref 70–99)
Potassium: 5.1 mmol/L (ref 3.5–5.1)
Sodium: 131 mmol/L — ABNORMAL LOW (ref 135–145)
Total Bilirubin: 2 mg/dL — ABNORMAL HIGH (ref 0.3–1.2)
Total Protein: 8.2 g/dL — ABNORMAL HIGH (ref 6.5–8.1)

## 2021-06-16 MED ORDER — SODIUM CHLORIDE 0.9 % IV SOLN
10.0000 mg | Freq: Once | INTRAVENOUS | Status: AC
  Administered 2021-06-16: 10 mg via INTRAVENOUS
  Filled 2021-06-16: qty 10

## 2021-06-16 MED ORDER — SODIUM CHLORIDE 0.9 % IV SOLN
35.0000 mg/m2 | Freq: Once | INTRAVENOUS | Status: AC
  Administered 2021-06-16: 63 mg via INTRAVENOUS
  Filled 2021-06-16: qty 63

## 2021-06-16 MED ORDER — SODIUM CHLORIDE 0.9 % IV SOLN
1400.0000 mg | Freq: Once | INTRAVENOUS | Status: AC
  Administered 2021-06-16: 1400 mg via INTRAVENOUS
  Filled 2021-06-16: qty 26.3

## 2021-06-16 MED ORDER — POTASSIUM CHLORIDE IN NACL 20-0.9 MEQ/L-% IV SOLN
Freq: Once | INTRAVENOUS | Status: AC
  Filled 2021-06-16: qty 1000

## 2021-06-16 MED ORDER — MAGNESIUM SULFATE 2 GM/50ML IV SOLN
2.0000 g | Freq: Once | INTRAVENOUS | Status: AC
  Administered 2021-06-16: 2 g via INTRAVENOUS
  Filled 2021-06-16: qty 50

## 2021-06-16 MED ORDER — SODIUM CHLORIDE 0.9 % IV SOLN
Freq: Once | INTRAVENOUS | Status: AC
Start: 2021-06-16 — End: 2021-06-16
  Filled 2021-06-16: qty 250

## 2021-06-16 MED ORDER — PALONOSETRON HCL INJECTION 0.25 MG/5ML
0.2500 mg | Freq: Once | INTRAVENOUS | Status: AC
Start: 2021-06-16 — End: 2021-06-16
  Administered 2021-06-16: 0.25 mg via INTRAVENOUS
  Filled 2021-06-16: qty 5

## 2021-06-16 MED ORDER — SODIUM CHLORIDE 0.9 % IV SOLN
150.0000 mg | Freq: Once | INTRAVENOUS | Status: AC
  Administered 2021-06-16: 150 mg via INTRAVENOUS
  Filled 2021-06-16: qty 150

## 2021-06-16 NOTE — Progress Notes (Signed)
Hematology/Oncology Consult note [facility]  Telephone:([phone] [phone]  Patient Care Team: Steele Sizer, MD as PCP - General (Family Medicine) Neldon Labella, RN as Case Manager Clent Jacks, RN as Oncology Nurse Navigator Latta, Salome, LCSW as Social Worker Germaine Pomfret, [facility] (Pharmacist)   Name of the patient: Janice Short  [id number]  [birth year]   Date of visit: 06/16/21  Diagnosis- stage IV intrahepatic cholangiocarcinoma  Chief complaint/ Reason for visit-on treatment assessment prior to cycle 2-day 1 of gemcitabine cisplatin chemotherapy  Heme/Onc history: Patient is a 60 year old African-American female with a past medical history significant for hypertension hyperlipidemia and type 2 diabetes who presented to the ER with symptoms of cramping abdominal pain and nausea.  This was followed by right upper quadrant ultrasound which showed multiple echogenic masses in the liver with nodular contour of the liver possibly suggestive of cirrhosis.  Several nodular masses in the vicinity of the pancreatic head and porta hepatic lymph nodes.  This was followed by an MRI abdomen and MRCP.  It showed right hepatic lobe masslike area measuring 4.9 x 2.9 cm.  Diffusion abnormality in the left hepatic lobe with mild left-sided biliary ductal dilatation and another area of abnormality measuring 1.4 x 1 cm.  Bulky celiac adenopathy 3 x 2.1 cm tracking into gastrohepatic recess.  Suspected portal venous invasion with convex protrusion in the right portal vein.  Left adrenal lesion measuring 2.9 x 1.5 cm.  Findings concerning for cholangiocarcinoma or biphenotypic cholangiole hepatocellular neoplasm.     Patient underwent CT-guided liver biopsy Which was consistent with adenocarcinoma poorly differentiated.  Immunohistochemistry showed CK7 positivity, CDX2 negative, Heppar negative, GATA3 negative.  CK19 positive.  Differentials include  cholangiocarcinoma, pancreatic, upper GI.  Lung and breast less likely.  However given CK19 positivity most compatible with cholangiocarcinoma.   Patient's case was discussed at tumor board and it was felt as if the lymphadenopathy was more like a confluent primary tumor mass.  Patient was referred to [facility] for second opinion to see if she would be a candidate for surgery.However upon their review of the PET scan it was deemed that this was indeed periportal adenopathy.  They also did a CT chest abdomen and pelvis with MIPS protocol and she was also found to have portocaval lymph node measuring 2.4 cm which was more consistent with metastatic disease.  She was deemed unresectable   NGS testing showed BRCA2, FGF R2, PIK3CA, RAD 54L, CDC73, CDK N2 A/B CR EBBP rearrangement exon 31     Interval history-patient missed cycle 1 day 8 of chemotherapy.  She still does not have a port in place.  Reports having tolerated chemotherapy well without any significant side effects.  Denies any significant abdominal pain  ECOG PS- 1 Pain scale- 0   Review of systems- Review of Systems  Constitutional:  Positive for malaise/fatigue. Negative for chills, fever and weight loss.  HENT:  Negative for congestion, ear discharge and nosebleeds.   Eyes:  Negative for blurred vision.  Respiratory:  Negative for cough, hemoptysis, sputum production, shortness of breath and wheezing.   Cardiovascular:  Negative for chest pain, palpitations, orthopnea and claudication.  Gastrointestinal:  Negative for abdominal pain, blood in stool, constipation, diarrhea, heartburn, melena, nausea and vomiting.  Genitourinary:  Negative for dysuria, flank pain, frequency, hematuria and urgency.  Musculoskeletal:  Negative for back pain, joint pain and myalgias.  Skin:  Negative for rash.  Neurological:  Negative for dizziness, tingling,  focal weakness, seizures, weakness and headaches.  Endo/Heme/Allergies:  Does not bruise/bleed easily.   Psychiatric/Behavioral:  Negative for depression and suicidal ideas. The patient does not have insomnia.       Allergies  Allergen Reactions   Other Shortness Of Breath    Cat dander and grass pollen   Bydureon [Exenatide] Other (See Comments)    Pain with injection not allergy     Past Medical History:  Diagnosis Date   Allergy    Asthma    Carpal tunnel syndrome on left    Cervical radiculitis    Chronic osteoarthritis    Corns and callosity    Depression    Dermatophytosis of foot    Diabetes mellitus without complication (HCC)    Gout    Hyperlipidemia    Hypertension    Impingement syndrome of left shoulder    Intrahepatic cholangiocarcinoma (HCC)    Lumbosacral neuritis    Microalbuminuria    Obesity    Supraventricular tachycardia ([place]) 07/01/2015   SVT (supraventricular tachycardia) (HCC)    Tenosynovitis of wrist    Vitamin D deficiency      Past Surgical History:  Procedure Laterality Date   ABDOMINAL HYSTERECTOMY     COLONOSCOPY WITH PROPOFOL N/A 05/06/2021   Procedure: COLONOSCOPY WITH PROPOFOL;  Surgeon: Lucilla Lame, MD;  Location: [facility] ENDOSCOPY;  Service: Endoscopy;  Laterality: N/A;   ESOPHAGOGASTRODUODENOSCOPY (EGD) WITH PROPOFOL N/A 05/06/2021   Procedure: ESOPHAGOGASTRODUODENOSCOPY (EGD) WITH PROPOFOL;  Surgeon: Lucilla Lame, MD;  Location[facility] END[facility]COPY;  Service: Endoscopy;  Laterality: N/A;    Social History   Socioeconomic History   Marital status: Single    Spouse name: Not on file   Number of children: 2   Years of education: Not on file   Highest education level: Not on file  Occupational History   Occupation: disable     Comment: from chronic back pain, 2019  Tobacco Use   Smoking status: Some Days    Packs/day: 0.25    Pack years: 0.00    Types: Cigarettes   Smokeless tobacco: Never   Tobacco comments:    2 per day 01/30/21  Vaping Use   Vaping Use: Never used  Substance and Sexual Activity   Alcohol use: Yes     Alcohol/week: 0.0 standard drinks    Comment: rare maybe 1-2  year   Drug use: No   Sexual activity: Yes  Other Topics Concern   Not on file  Social History Narrative   She is on disability for chronic back pain. Pt's son lives with her   Social Determinants of Health   Financial Resource Strain: Medium Risk   Difficulty of Paying Living Expenses: Somewhat hard  Food Insecurity: No Food Insecurity   Worried About Charity fundraiser in the Last Year: Never true   Arboriculturist in the Last Year: Never true  Transportation Needs: No Transportation Needs   Lack of Transportation (Medical): No   Lack of Transportation (Non-Medical): No  Physical Activity: Inactive   Days of Exercise per Week: 0 days   Minutes of Exercise per Session: 0 min  Stress: Stress Concern Present   Feeling of Stress : To some extent  Social Connections: Moderately Isolated   Frequency of Communication with Friends and Family: More than three times a week   Frequency of Social Gatherings with Friends and Family: Three times a week   Attends Religious Services: More than 4 times per year  Active Member of Clubs or Organizations: No   Attends Archivist Meetings: Never   Marital Status: Never married  Human resources officer Violence: Not At Risk   Fear of Current or Ex-Partner: No   Emotionally Abused: No   Physically Abused: No   Sexually Abused: No    Family History  Problem Relation Age of Onset   Heart attack Mother    CAD Mother    Kidney disease Father      Current Outpatient Medications:    albuterol (VENTOLIN HFA) 108 (90 Base) MCG/ACT inhaler, INHALE 2 PUFFS BY MOUTH EVERY 6 HOURS AS NEEDED FOR WHEEZING AND FOR SHORTNESS OF BREATH, Disp: 18 g, Rfl: 1   aspirin 81 MG tablet, Take 1 tablet by mouth daily., Disp: , Rfl:    atorvastatin (LIPITOR) 40 MG tablet, Take 1 tablet (40 mg total) by mouth at bedtime., Disp: 90 tablet, Rfl: 1   blood glucose meter kit and supplies, Dispense based  on patient and insurance preference. Use up to four times daily as directed. (FOR ICD-10 E10.9, E11.9)., Disp: 1 each, Rfl: 0   dexamethasone (DECADRON) 4 MG tablet, Take 2 tablets (8 mg total) by mouth daily. Take daily x 3 days starting the day after cisplatin chemotherapy. Take with food. (Patient not taking: Reported on 05/26/2021), Disp: 30 tablet, Rfl: 1   diphenhydramine-acetaminophen (TYLENOL PM) 25-500 MG TABS tablet, Take 1 tablet by mouth at bedtime as needed., Disp: , Rfl:    Empagliflozin-metFORMIN HCl ER (SYNJARDY XR) 12.04-999 MG TB24, Take 2 tablets by mouth daily. New dose, Disp: 180 tablet, Rfl: 1   escitalopram (LEXAPRO) 10 MG tablet, Take 1 tablet (10 mg total) by mouth daily., Disp: 90 tablet, Rfl: 0   fluticasone (FLONASE) 50 MCG/ACT nasal spray, USE 2 SPRAY(S) IN EACH NOSTRIL AT BEDTIME (Patient taking differently: Place 2 sprays into both nostrils daily as needed. USE 2 SPRAY(S) IN EACH NOSTRIL AT BEDTIME), Disp: 16 g, Rfl: 0   Fluticasone-Umeclidin-Vilant (TRELEGY ELLIPTA) 100-62.5-25 MCG/INH AEPB, Take 1 puff by mouth daily., Disp: 180 each, Rfl: 1   gabapentin (NEURONTIN) 300 MG capsule, Take 2 capsules (600 mg total) by mouth 2 (two) times daily., Disp: 360 capsule, Rfl: 1   Insulin Degludec (TRESIBA) 100 UNIT/ML SOLN, Inject 12-30 units of lipase into the skin daily. To keep fasting glucose between 100-140, Disp: 15 mL, Rfl: 0   Insulin Pen Needle 31G X 8 MM MISC, 1 each by Does not apply route daily., Disp: 100 each, Rfl: 1   lidocaine-prilocaine (EMLA) cream, Apply to affected area once (Patient not taking: Reported on 05/26/2021), Disp: 30 g, Rfl: 3   losartan (COZAAR) 25 MG tablet, Take 1 tablet (25 mg total) by mouth daily., Disp: 90 tablet, Rfl: 0   ondansetron (ZOFRAN) 8 MG tablet, Take 1 tablet (8 mg total) by mouth 2 (two) times daily as needed. Start on the third day after cisplatin chemotherapy. (Patient not taking: Reported on 05/26/2021), Disp: 30 tablet, Rfl: 1    prochlorperazine (COMPAZINE) 10 MG tablet, Take 1 tablet (10 mg total) by mouth every 6 (six) hours as needed (Nausea or vomiting). (Patient not taking: Reported on 05/26/2021), Disp: 30 tablet, Rfl: 1  Physical exam:  Vitals:   06/16/21 0906  BP: 128/75  Pulse: 76  Resp: 20  Temp: (!) 96.1 F (35.6 C)  TempSrc: Tympanic  SpO2: 100%  Weight: 146 lb 3.2 oz (66.3 kg)   Physical Exam Constitutional:      General:  She is not in acute distress. Cardiovascular:     Rate and Rhythm: Normal rate and regular rhythm.     Heart sounds: Normal heart sounds.  Pulmonary:     Effort: Pulmonary effort is normal.     Breath sounds: Normal breath sounds.  Abdominal:     General: Bowel sounds are normal.     Palpations: Abdomen is soft.  Skin:    General: Skin is warm and dry.  Neurological:     Mental Status: She is alert and oriented to person, place, and time.     CMP Latest Ref Rng & Units 06/16/2021  Glucose 70 - 99 mg/dL 233(H)  BUN 8 - 23 mg/dL 19  Creatinine 0.44 - 1.00 mg/dL 1.10(H)  Sodium 135 - 145 mmol/L 131(L)  Potassium 3.5 - 5.1 mmol/L 5.1  Chloride 98 - 111 mmol/L 104  CO2 22 - 32 mmol/L 24  Calcium 8.9 - 10.3 mg/dL 8.7(L)  Total Protein 6.5 - 8.1 g/dL 8.2(H)  Total Bilirubin 0.3 - 1.2 mg/dL 2.0(H)  Alkaline Phos 38 - 126 U/L 916(H)  AST 15 - 41 U/L 93(H)  ALT 0 - 44 U/L 78(H)   CBC Latest Ref Rng & Units 06/16/2021  WBC 4.0 - 10.5 K/uL 7.1  Hemoglobin 12.0 - 15.0 g/dL 10.9(L)  Hematocrit 36.0 - 46.0 % 33.8(L)  Platelets 150 - 400 K/uL 500(H)      Assessment and plan- Patient is a 60 y.o. female  with stage IV intrahepatic cholangiocarcinoma.  She is here for on treatment assessment prior to cycle 2-day 1 of gemcitabine cisplatin chemotherapy  Counts okay to proceed with cycle 2-day 1 of gemcitabine cisplatin chemotherapy today.  She will return to clinic in 1 week for cycle 2-day 8 and I will see her back in 3 weeks for cycle 3-day 1 of treatment.  Baseline  abnormal LFTs secondary to large intrahepatic mass.  Continue to monitor  Normocytic anemia:Likely secondary to malignancy.  Will check ferritin and iron studies B12 and folate within next set of labs.     Visit Diagnosis 1. Encounter for antineoplastic chemotherapy   2. Intrahepatic cholangiocarcinoma ([place])   3. Normocytic anemia      Dr. Randa Evens, MD, MPH [facility] at [facility] [phone] 06/16/2021 4:09 PM

## 2021-06-16 NOTE — Progress Notes (Signed)
AST 93, ALT 78, Bili 2.0, Alk Phos 916. Per Dr. Janese Banks, okay to proceed with treatment.

## 2021-06-16 NOTE — Patient Instructions (Signed)
[facility] ONCOLOGY  Discharge Instructions: Thank you for choosing [facility] to provide your oncology and hematology care.  If you have a lab appointment with the [facility], please go directly to the [facility] and check in at the registration area.  Wear comfortable clothing and clothing appropriate for easy access to any Portacath or PICC line.   We strive to give you quality time with your provider. You may need to reschedule your appointment if you arrive late (15 or more minutes).  Arriving late affects you and other patients whose appointments are after yours.  Also, if you miss three or more appointments without notifying the office, you may be dismissed from the clinic at the provider's discretion.      For prescription refill requests, have your pharmacy contact our office and allow 72 hours for refills to be completed.    Today you received the following chemotherapy and/or immunotherapy agents Gemzar & Cisplatin      To help prevent nausea and vomiting after your treatment, we encourage you to take your nausea medication as directed.  BELOW ARE SYMPTOMS THAT SHOULD BE REPORTED IMMEDIATELY: *FEVER GREATER THAN 100.4 F (38 C) OR HIGHER *CHILLS OR SWEATING *NAUSEA AND VOMITING THAT IS NOT CONTROLLED WITH YOUR NAUSEA MEDICATION *UNUSUAL SHORTNESS OF BREATH *UNUSUAL BRUISING OR BLEEDING *URINARY PROBLEMS (pain or burning when urinating, or frequent urination) *BOWEL PROBLEMS (unusual diarrhea, constipation, pain near the anus) TENDERNESS IN MOUTH AND THROAT WITH OR WITHOUT PRESENCE OF ULCERS (sore throat, sores in mouth, or a toothache) UNUSUAL RASH, SWELLING OR PAIN  UNUSUAL VAGINAL DISCHARGE OR ITCHING   Items with * indicate a potential emergency and should be followed up as soon as possible or go to the Emergency Department if any problems should occur.  Please show the CHEMOTHERAPY ALERT CARD or IMMUNOTHERAPY ALERT CARD at  check-in to the Emergency Department and triage nurse.  Should you have questions after your visit or need to cancel or reschedule your appointment, please contact [facility]  [phone] and follow the prompts.  Office hours are 8:00 a.m. to 4:30 p.m. Monday - Friday. Please note that voicemails left after 4:00 p.m. may not be returned until the following business day.  We are closed weekends and major holidays. You have access to a nurse at all times for urgent questions. Please call the main number to the clinic [phone] and follow the prompts.  For any non-urgent questions, you may also contact your provider using MyChart. We now offer e-Visits for anyone 70 and older to request care online for non-urgent symptoms. For details visit [email or web address].   Also download the MyChart app! Go to the app store, search "MyChart", open the app, select , and log in with your MyChart username and password.  Due to Covid, a mask is required upon entering the hospital/clinic. If you do not have a mask, one will be given to you upon arrival. For doctor visits, patients may have 1 support person aged 5 or older with them. For treatment visits, patients cannot have anyone with them due to current Covid guidelines and our immunocompromised population.

## 2021-06-20 ENCOUNTER — Ambulatory Visit (INDEPENDENT_AMBULATORY_CARE_PROVIDER_SITE_OTHER): Payer: Medicare HMO | Admitting: *Deleted

## 2021-06-20 DIAGNOSIS — R69 Illness, unspecified: Secondary | ICD-10-CM | POA: Diagnosis not present

## 2021-06-20 DIAGNOSIS — F331 Major depressive disorder, recurrent, moderate: Secondary | ICD-10-CM | POA: Diagnosis not present

## 2021-06-20 DIAGNOSIS — E118 Type 2 diabetes mellitus with unspecified complications: Secondary | ICD-10-CM | POA: Diagnosis not present

## 2021-06-21 NOTE — Chronic Care Management (AMB) (Signed)
Chronic Care Management    Clinical Social Work Note  06/21/2021 Name: Janice Short MRN: [id number] DOB: Aug 03, 1961  Janice Short is a 60 y.o. year old female who is a primary care patient of Steele Sizer, MD. The CCM team was consulted to assist the patient with chronic disease management and/or care coordination needs related to: [place] and Resources.   Engaged with patient by telephone for follow up visit in response to provider referral for social work chronic care management and care coordination services.   Consent to Services:  The patient was given information about Chronic Care Management services, agreed to services, and gave verbal consent prior to initiation of services.  Please see initial visit note for detailed documentation.   Patient agreed to services and consent obtained.   Assessment: Review of patient past medical history, allergies, medications, and health status, including review of relevant consultants reports was performed today as part of a comprehensive evaluation and provision of chronic care management and care coordination services.     SDOH (Social Determinants of Health) assessments and interventions performed:    Advanced Directives Status: Not addressed in this encounter.  CCM Care Plan  Allergies  Allergen Reactions   Other Shortness Of Breath    Cat dander and grass pollen   Bydureon [Exenatide] Other (See Comments)    Pain with injection not allergy    Outpatient Encounter Medications as of 06/20/2021  Medication Sig   albuterol (VENTOLIN HFA) 108 (90 Base) MCG/ACT inhaler INHALE 2 PUFFS BY MOUTH EVERY 6 HOURS AS NEEDED FOR WHEEZING AND FOR SHORTNESS OF BREATH   aspirin 81 MG tablet Take 1 tablet by mouth daily.   atorvastatin (LIPITOR) 40 MG tablet Take 1 tablet (40 mg total) by mouth at bedtime.   blood glucose meter kit and supplies Dispense based on patient and insurance preference. Use up to four times daily as  directed. (FOR ICD-10 E10.9, E11.9).   dexamethasone (DECADRON) 4 MG tablet Take 2 tablets (8 mg total) by mouth daily. Take daily x 3 days starting the day after cisplatin chemotherapy. Take with food. (Patient not taking: Reported on 05/26/2021)   diphenhydramine-acetaminophen (TYLENOL PM) 25-500 MG TABS tablet Take 1 tablet by mouth at bedtime as needed.   Empagliflozin-metFORMIN HCl ER (SYNJARDY XR) 12.04-999 MG TB24 Take 2 tablets by mouth daily. New dose   escitalopram (LEXAPRO) 10 MG tablet Take 1 tablet (10 mg total) by mouth daily.   fluticasone (FLONASE) 50 MCG/ACT nasal spray USE 2 SPRAY(S) IN EACH NOSTRIL AT BEDTIME (Patient taking differently: Place 2 sprays into both nostrils daily as needed. USE 2 SPRAY(S) IN EACH NOSTRIL AT BEDTIME)   Fluticasone-Umeclidin-Vilant (TRELEGY ELLIPTA) 100-62.5-25 MCG/INH AEPB Take 1 puff by mouth daily.   gabapentin (NEURONTIN) 300 MG capsule Take 2 capsules (600 mg total) by mouth 2 (two) times daily.   Insulin Degludec (TRESIBA) 100 UNIT/ML SOLN Inject 12-30 units of lipase into the skin daily. To keep fasting glucose between 100-140   Insulin Pen Needle 31G X 8 MM MISC 1 each by Does not apply route daily.   lidocaine-prilocaine (EMLA) cream Apply to affected area once (Patient not taking: Reported on 05/26/2021)   losartan (COZAAR) 25 MG tablet Take 1 tablet (25 mg total) by mouth daily.   ondansetron (ZOFRAN) 8 MG tablet Take 1 tablet (8 mg total) by mouth 2 (two) times daily as needed. Start on the third day after cisplatin chemotherapy. (Patient not taking: Reported on 05/26/2021)  prochlorperazine (COMPAZINE) 10 MG tablet Take 1 tablet (10 mg total) by mouth every 6 (six) hours as needed (Nausea or vomiting). (Patient not taking: Reported on 05/26/2021)   No facility-administered encounter medications on file as of 06/20/2021.    Patient Active Problem List   Diagnosis Date Noted   Screen for colon cancer    Rectal polyp    Polyp of sigmoid  colon    Abnormal findings on diagnostic imaging of digestive system    Gastritis with hemorrhage    Polyp of duodenum    Intrahepatic cholangiocarcinoma ([place]) 04/12/2021   Goals of care, counseling/discussion 04/12/2021   Mild episode of recurrent major depressive disorder ([place]) 02/08/2019   Dyslipidemia associated with type 2 diabetes mellitus ([place]) 12/17/2017   Noncompliance with medication regimen 12/17/2017   Spinal stenosis 10/15/2016   Diabetic polyneuropathy associated with type 2 diabetes mellitus ([place]) 07/14/2016   Chronic midline low back pain with sciatica 06/22/2016   Primary osteoarthritis of both knees 06/22/2016   Carpal tunnel syndrome 07/01/2015   Cervical radiculitis 07/01/2015   Corn or callus 07/01/2015   Impingement syndrome of shoulder 07/01/2015   Microalbuminuria 07/01/2015   Compulsive tobacco user syndrome 07/01/2015   Tenosynovitis of wrist 07/01/2015   Benign hypertension 11/05/2009   Uncontrolled type 2 diabetes mellitus with microalbuminuria ([place]) 11/05/2009   Athlete's foot 08/05/2009   Combined fat and carbohydrate induced hyperlipemia 01/08/2009   Vitamin D deficiency 01/08/2009   Asthma, mild intermittent 12/28/2008   Allergic rhinitis 12/28/2008   Lumbosacral neuritis 08/11/2007    Conditions to be addressed/monitored: Depression; Mental Health Concerns   Care Plan : Depression (Adult)  Updates made by Vern Claude, LCSW since 06/21/2021 12:00 AM     Problem: Symptoms (Depression)      Long-Range Goal: Symptoms Monitored and Managed   Start Date: 05/02/2021  Expected End Date: 12/02/2021  This Visit's Progress: On track  Recent Progress: On track  Priority: Medium  Note:   Current Barriers:  Acute Mental Health needs related to recent diagnosis Mental Health Concerns  Suicidal Ideation/Homicidal Ideation: No  Clinical Social Work Goal(s):  Over the next 90 days, patient will work with SW bi-weekly by telephone or in person to  reduce or manage symptoms related to depression patient will work with SW to address concerns related to depressed mood due to recent cancer diagnosis  Interventions: SDOH Interventions    Flowsheet Row Most Recent Value  SDOH Interventions   SDOH Interventions for the Following Domains Depression  Depression Interventions/Treatment  Counseling, Medication     Follow up phone call to patient regarding her recent diagnosis Patient discussed feeling much better since last contact-stating that she "is putting it in God's hands" Confirmed initial start of her treatments through the [facility] and so far, doing well. Patient confirms needing assistance with a utility bill, patient has an appointment with the [facility] on 06/23/21, patient encouraged to explore any possible assistance with the social worker at the [facility] Patient encouraged to verbalize her feeling regarding recent diagnosis, emotional support provided, however seemed guarded today Self care emphasized, positive coping discussed and reinforced including consideration of long term mental health counseling Discussed plans with patient for ongoing care management follow up and provided patient with direct contact information for care management team   Patient Self Care Activities:  Performs ADL's independently Performs IADL's independently Ability for insight Strong family or social support  Patient Coping Strengths:  Supportive Relationships Family Friends  Able to Communicate Effectively  Patient Self Care Deficits:  Coping struggles related to recent diagnosis         Follow Up Plan: SW will follow up with patient by phone over the next 14 business days      [place], [place] Worker  [facility] Care Management [phone]

## 2021-06-21 NOTE — Patient Instructions (Signed)
Visit Information   Goals Addressed             This Visit's Progress    Manage My Emotions       Timeframe:  Long-Range Goal Priority:  Medium Start Date:    05/02/21                         Expected End Date:  12/02/21                   Follow Up Date:07/26/21   - begin personal counseling if needed - call and visit an old friend - practice relaxation or meditation daily - talk about feelings with a friend, family or spiritual advisor - practice positive thinking and self-talk  -discuss financial concerns with social worker at the [facility] on 06/23/21   Why is this important?   When you are stressed, down or upset, your body reacts too.  For example, your blood pressure may get higher; you may have a headache or stomachache.  When your emotions get the best of you, your body's ability to fight off cold and flu gets weak.  These steps will help you manage your emotions.     Notes:         The patient verbalized understanding of instructions, educational materials, and care plan provided today and declined offer to receive copy of patient instructions, educational materials, and care plan.   Telephone follow up appointment with care management team member scheduled for: 06/25/21  Elliot Gurney, [place] Worker  [facility] [phone]

## 2021-06-23 ENCOUNTER — Ambulatory Visit: Payer: Self-pay | Admitting: *Deleted

## 2021-06-23 ENCOUNTER — Inpatient Hospital Stay: Payer: Medicare HMO

## 2021-06-23 ENCOUNTER — Other Ambulatory Visit: Payer: Self-pay

## 2021-06-23 VITALS — BP 120/72 | HR 83 | Temp 96.0°F | Resp 17 | Wt 144.5 lb

## 2021-06-23 DIAGNOSIS — D509 Iron deficiency anemia, unspecified: Secondary | ICD-10-CM | POA: Diagnosis not present

## 2021-06-23 DIAGNOSIS — R5383 Other fatigue: Secondary | ICD-10-CM | POA: Diagnosis not present

## 2021-06-23 DIAGNOSIS — Z599 Problem related to housing and economic circumstances, unspecified: Secondary | ICD-10-CM

## 2021-06-23 DIAGNOSIS — I10 Essential (primary) hypertension: Secondary | ICD-10-CM | POA: Diagnosis not present

## 2021-06-23 DIAGNOSIS — E119 Type 2 diabetes mellitus without complications: Secondary | ICD-10-CM | POA: Diagnosis not present

## 2021-06-23 DIAGNOSIS — Z5111 Encounter for antineoplastic chemotherapy: Secondary | ICD-10-CM

## 2021-06-23 DIAGNOSIS — E785 Hyperlipidemia, unspecified: Secondary | ICD-10-CM | POA: Diagnosis not present

## 2021-06-23 DIAGNOSIS — C221 Intrahepatic bile duct carcinoma: Secondary | ICD-10-CM

## 2021-06-23 DIAGNOSIS — R7989 Other specified abnormal findings of blood chemistry: Secondary | ICD-10-CM | POA: Diagnosis not present

## 2021-06-23 DIAGNOSIS — Z79899 Other long term (current) drug therapy: Secondary | ICD-10-CM | POA: Diagnosis not present

## 2021-06-23 DIAGNOSIS — F331 Major depressive disorder, recurrent, moderate: Secondary | ICD-10-CM

## 2021-06-23 DIAGNOSIS — E118 Type 2 diabetes mellitus with unspecified complications: Secondary | ICD-10-CM

## 2021-06-23 DIAGNOSIS — Z5189 Encounter for other specified aftercare: Secondary | ICD-10-CM | POA: Diagnosis not present

## 2021-06-23 LAB — COMPREHENSIVE METABOLIC PANEL
ALT: 53 U/L — ABNORMAL HIGH (ref 0–44)
AST: 58 U/L — ABNORMAL HIGH (ref 15–41)
Albumin: 3 g/dL — ABNORMAL LOW (ref 3.5–5.0)
Alkaline Phosphatase: 779 U/L — ABNORMAL HIGH (ref 38–126)
Anion gap: 10 (ref 5–15)
BUN: 24 mg/dL — ABNORMAL HIGH (ref 8–23)
CO2: 20 mmol/L — ABNORMAL LOW (ref 22–32)
Calcium: 8.8 mg/dL — ABNORMAL LOW (ref 8.9–10.3)
Chloride: 101 mmol/L (ref 98–111)
Creatinine, Ser: 1.16 mg/dL — ABNORMAL HIGH (ref 0.44–1.00)
GFR, Estimated: 53 mL/min — ABNORMAL LOW (ref >60)
Glucose, Bld: 168 mg/dL — ABNORMAL HIGH (ref 70–99)
Potassium: 3.7 mmol/L (ref 3.5–5.1)
Sodium: 131 mmol/L — ABNORMAL LOW (ref 135–145)
Total Bilirubin: 3.5 mg/dL — ABNORMAL HIGH (ref 0.3–1.2)
Total Protein: 8.1 g/dL (ref 6.5–8.1)

## 2021-06-23 LAB — CBC WITH DIFFERENTIAL/PLATELET
Abs Immature Granulocytes: 0.04 10*3/uL (ref 0.00–0.07)
Basophils Absolute: 0.1 10*3/uL (ref 0.0–0.1)
Basophils Relative: 1 %
Eosinophils Absolute: 0.1 10*3/uL (ref 0.0–0.5)
Eosinophils Relative: 2 %
HCT: 28.6 % — ABNORMAL LOW (ref 36.0–46.0)
Hemoglobin: 9.4 g/dL — ABNORMAL LOW (ref 12.0–15.0)
Immature Granulocytes: 1 %
Lymphocytes Relative: 45 %
Lymphs Abs: 2 10*3/uL (ref 0.7–4.0)
MCH: 32.5 pg (ref 26.0–34.0)
MCHC: 32.9 g/dL (ref 30.0–36.0)
MCV: 99 fL (ref 80.0–100.0)
Monocytes Absolute: 0.5 10*3/uL (ref 0.1–1.0)
Monocytes Relative: 11 %
Neutro Abs: 1.8 10*3/uL (ref 1.7–7.7)
Neutrophils Relative %: 40 %
Platelets: 254 10*3/uL (ref 150–400)
RBC: 2.89 MIL/uL — ABNORMAL LOW (ref 3.87–5.11)
RDW: 15.9 % — ABNORMAL HIGH (ref 11.5–15.5)
WBC: 4.5 10*3/uL (ref 4.0–10.5)
nRBC: 0 % (ref 0.0–0.2)

## 2021-06-23 LAB — FOLATE: Folate: 7.3 ng/mL (ref >5.9)

## 2021-06-23 LAB — IRON AND TIBC
Iron: 57 ug/dL (ref 28–170)
Saturation Ratios: 21 % (ref 10.4–31.8)
TIBC: 274 ug/dL (ref 250–450)
UIBC: 217 ug/dL

## 2021-06-23 LAB — FERRITIN: Ferritin: 171 ng/mL (ref 11–307)

## 2021-06-23 LAB — MAGNESIUM: Magnesium: 2.7 mg/dL — ABNORMAL HIGH (ref 1.7–2.4)

## 2021-06-23 LAB — VITAMIN B12: Vitamin B-12: 313 pg/mL (ref 180–914)

## 2021-06-23 MED ORDER — MAGNESIUM SULFATE 2 GM/50ML IV SOLN: 2.0000 g | Freq: Once | INTRAVENOUS | Status: DC

## 2021-06-23 MED ORDER — SODIUM CHLORIDE 0.9 % IV SOLN
10.0000 mg | Freq: Once | INTRAVENOUS | Status: AC
  Administered 2021-06-23: 10 mg via INTRAVENOUS
  Filled 2021-06-23: qty 10

## 2021-06-23 MED ORDER — SODIUM CHLORIDE 0.9 % IV SOLN
150.0000 mg | Freq: Once | INTRAVENOUS | Status: AC
  Administered 2021-06-23: 150 mg via INTRAVENOUS
  Filled 2021-06-23: qty 150

## 2021-06-23 MED ORDER — SODIUM CHLORIDE 0.9 % IV SOLN
Freq: Once | INTRAVENOUS | Status: AC
  Filled 2021-06-23: qty 250

## 2021-06-23 MED ORDER — SODIUM CHLORIDE 0.9 % IV SOLN
1400.0000 mg | Freq: Once | INTRAVENOUS | Status: AC
  Administered 2021-06-23: 1400 mg via INTRAVENOUS
  Filled 2021-06-23: qty 26.3

## 2021-06-23 MED ORDER — SODIUM CHLORIDE 0.9 % IV SOLN
35.0000 mg/m2 | Freq: Once | INTRAVENOUS | Status: AC
  Administered 2021-06-23: 63 mg via INTRAVENOUS
  Filled 2021-06-23: qty 63

## 2021-06-23 MED ORDER — POTASSIUM CHLORIDE IN NACL 20-0.9 MEQ/L-% IV SOLN
Freq: Once | INTRAVENOUS | Status: AC
  Filled 2021-06-23: qty 1000

## 2021-06-23 MED ORDER — PEGFILGRASTIM 6 MG/0.6ML ~~LOC~~ PSKT
6.0000 mg | PREFILLED_SYRINGE | Freq: Once | SUBCUTANEOUS | Status: AC
  Administered 2021-06-23: 6 mg via SUBCUTANEOUS
  Filled 2021-06-23: qty 0.6

## 2021-06-23 MED ORDER — PALONOSETRON HCL INJECTION 0.25 MG/5ML
0.2500 mg | Freq: Once | INTRAVENOUS | Status: AC
  Administered 2021-06-23: 0.25 mg via INTRAVENOUS
  Filled 2021-06-23: qty 5

## 2021-06-23 MED ORDER — SODIUM CHLORIDE 0.9 % IV SOLN: Freq: Once | INTRAVENOUS | Status: DC

## 2021-06-23 NOTE — Patient Instructions (Addendum)
[facility] ONCOLOGY  Discharge Instructions: Thank you for choosing [facility] to provide your oncology and hematology care.  If you have a lab appointment with the [facility], please go directly to the [facility] and check in at the registration area.  Wear comfortable clothing and clothing appropriate for easy access to any Portacath or PICC line.   We strive to give you quality time with your provider. You may need to reschedule your appointment if you arrive late (15 or more minutes).  Arriving late affects you and other patients whose appointments are after yours.  Also, if you miss three or more appointments without notifying the office, you may be dismissed from the clinic at the provider's discretion.      For prescription refill requests, have your pharmacy contact our office and allow 72 hours for refills to be completed.    Today you received the following chemotherapy and/or immunotherapy agents Cisplatin and Gemzar      To help prevent nausea and vomiting after your treatment, we encourage you to take your nausea medication as directed.  BELOW ARE SYMPTOMS THAT SHOULD BE REPORTED IMMEDIATELY: *FEVER GREATER THAN 100.4 F (38 C) OR HIGHER *CHILLS OR SWEATING *NAUSEA AND VOMITING THAT IS NOT CONTROLLED WITH YOUR NAUSEA MEDICATION *UNUSUAL SHORTNESS OF BREATH *UNUSUAL BRUISING OR BLEEDING *URINARY PROBLEMS (pain or burning when urinating, or frequent urination) *BOWEL PROBLEMS (unusual diarrhea, constipation, pain near the anus) TENDERNESS IN MOUTH AND THROAT WITH OR WITHOUT PRESENCE OF ULCERS (sore throat, sores in mouth, or a toothache) UNUSUAL RASH, SWELLING OR PAIN  UNUSUAL VAGINAL DISCHARGE OR ITCHING   Items with * indicate a potential emergency and should be followed up as soon as possible or go to the Emergency Department if any problems should occur.  Please show the CHEMOTHERAPY ALERT CARD or IMMUNOTHERAPY ALERT CARD at  check-in to the Emergency Department and triage nurse.  Should you have questions after your visit or need to cancel or reschedule your appointment, please contact [facility]  [phone] and follow the prompts.  Office hours are 8:00 a.m. to 4:30 p.m. Monday - Friday. Please note that voicemails left after 4:00 p.m. may not be returned until the following business day.  We are closed weekends and major holidays. You have access to a nurse at all times for urgent questions. Please call the main number to the clinic [phone] and follow the prompts.  For any non-urgent questions, you may also contact your provider using MyChart. We now offer e-Visits for anyone 64 and older to request care online for non-urgent symptoms. For details visit [email or web address].   Also download the MyChart app! Go to the app store, search "MyChart", open the app, select [facility], and log in with your MyChart username and password.  Due to Covid, a mask is required upon entering the hospital/clinic. If you do not have a mask, one will be given to you upon arrival. For doctor visits, patients may have 1 support person aged 65 or older with them. For treatment visits, patients cannot have anyone with them due to current Covid guidelines and our immunocompromised population.     Pegfilgrastim injection What is this medication? PEGFILGRASTIM (PEG fil gra stim) is a long-acting granulocyte colony-stimulating factor that stimulates the growth of neutrophils, a type of white blood cell important in the body's fight against infection. It is used to reduce the incidence of fever and infection in patients with certain  types of cancer who are receiving chemotherapy that affects the bone marrow, and toincrease survival after being exposed to high doses of radiation. This medicine may be used for other purposes; ask your health care provider orpharmacist if you have questions. COMMON  BRAND NAME(S): Rexene Edison, Ziextenzo What should I tell my care team before I take this medication? They need to know if you have any of these conditions: kidney disease latex allergy ongoing radiation therapy sickle cell disease skin reactions to acrylic adhesives (On-Body Injector only) an unusual or allergic reaction to pegfilgrastim, filgrastim, other medicines, foods, dyes, or preservatives pregnant or trying to get pregnant breast-feeding How should I use this medication? This medicine is for injection under the skin. If you get this medicine at home, you will be taught how to prepare and give the pre-filled syringe or how to use the On-body Injector. Refer to the patient Instructions for Use for detailed instructions. Use exactly as directed. Tell your healthcare provider immediately if you suspect that the On-body Injector may not have performed as intended or if you suspect the use of the On-body Injector resulted in a missedor partial dose. It is important that you put your used needles and syringes in a special sharps container. Do not put them in a trash can. If you do not have a sharpscontainer, call your pharmacist or healthcare provider to get one. Talk to your pediatrician regarding the use of this medicine in children. Whilethis drug may be prescribed for selected conditions, precautions do apply. Overdosage: If you think you have taken too much of this medicine contact apoison control center or emergency room at once. NOTE: This medicine is only for you. Do not share this medicine with others. What if I miss a dose? It is important not to miss your dose. Call your doctor or health care professional if you miss your dose. If you miss a dose due to an On-body Injector failure or leakage, a new dose should be administered as soon aspossible using a single prefilled syringe for manual use. What may interact with this medication? Interactions have not been  studied. This list may not describe all possible interactions. Give your health care provider a list of all the medicines, herbs, non-prescription drugs, or dietary supplements you use. Also tell them if you smoke, drink alcohol, or use illegaldrugs. Some items may interact with your medicine. What should I watch for while using this medication? Your condition will be monitored carefully while you are receiving thismedicine. You may need blood work done while you are taking this medicine. Talk to your health care provider about your risk of cancer. You may be more atrisk for certain types of cancer if you take this medicine. If you are going to need a MRI, CT scan, or other procedure, tell your doctorthat you are using this medicine (On-Body Injector only). What side effects may I notice from receiving this medication? Side effects that you should report to your doctor or health care professionalas soon as possible: allergic reactions (skin rash, itching or hives, swelling of the face, lips, or tongue) back pain dizziness fever pain, redness, or irritation at site where injected pinpoint red spots on the skin red or dark-brown urine shortness of breath or breathing problems stomach or side pain, or pain at the shoulder swelling tiredness trouble passing urine or change in the amount of urine unusual bruising or bleeding Side effects that usually do not require medical attention (report to yourdoctor or health  care professional if they continue or are bothersome): bone pain muscle pain This list may not describe all possible side effects. Call your doctor for medical advice about side effects. You may report side effects to FDA at1-800-FDA-1088. Where should I keep my medication? Keep out of the reach of children. If you are using this medicine at home, you will be instructed on how to storeit. Throw away any unused medicine after the expiration date on the label. NOTE: This sheet is a  summary. It may not cover all possible information. If you have questions about this medicine, talk to your doctor, pharmacist, orhealth care provider.  2022 Elsevier/Gold Standard (2020-12-20 11:54:14)

## 2021-06-23 NOTE — Chronic Care Management (AMB) (Addendum)
Chronic Care Management    Clinical Social Work Note  06/23/2021 Name: Janice Short MRN: [id number] DOB: 09/18/1961  Janice Short is a 60 y.o. year old female who is a primary care patient of Steele Sizer, MD. The CCM team was consulted to assist the patient with chronic disease management and/or care coordination needs related to: [employer] .   Collaboration with [facility] Social Worker  for follow up visit in response to provider referral for social work chronic care management and care coordination services.   Consent to Services:  The patient was given information about Chronic Care Management services, agreed to services, and gave verbal consent prior to initiation of services.  Please see initial visit note for detailed documentation.   Patient agreed to services and consent obtained.   Assessment: Review of patient past medical history, allergies, medications, and health status, including review of relevant consultants reports was performed today as part of a comprehensive evaluation and provision of chronic care management and care coordination services.     SDOH (Social Determinants of Health) assessments and interventions performed:    Advanced Directives Status: Not addressed in this encounter.  CCM Care Plan  Allergies  Allergen Reactions   Other Shortness Of Breath    Cat dander and grass pollen   Bydureon [Exenatide] Other (See Comments)    Pain with injection not allergy    Outpatient Encounter Medications as of 06/23/2021  Medication Sig   albuterol (VENTOLIN HFA) 108 (90 Base) MCG/ACT inhaler INHALE 2 PUFFS BY MOUTH EVERY 6 HOURS AS NEEDED FOR WHEEZING AND FOR SHORTNESS OF BREATH   aspirin 81 MG tablet Take 1 tablet by mouth daily.   atorvastatin (LIPITOR) 40 MG tablet Take 1 tablet (40 mg total) by mouth at bedtime.   blood glucose meter kit and supplies Dispense based on patient and insurance preference. Use up to four times daily as  directed. (FOR ICD-10 E10.9, E11.9).   dexamethasone (DECADRON) 4 MG tablet Take 2 tablets (8 mg total) by mouth daily. Take daily x 3 days starting the day after cisplatin chemotherapy. Take with food. (Patient not taking: Reported on 05/26/2021)   diphenhydramine-acetaminophen (TYLENOL PM) 25-500 MG TABS tablet Take 1 tablet by mouth at bedtime as needed.   Empagliflozin-metFORMIN HCl ER (SYNJARDY XR) 12.04-999 MG TB24 Take 2 tablets by mouth daily. New dose   escitalopram (LEXAPRO) 10 MG tablet Take 1 tablet (10 mg total) by mouth daily.   fluticasone (FLONASE) 50 MCG/ACT nasal spray USE 2 SPRAY(S) IN EACH NOSTRIL AT BEDTIME (Patient taking differently: Place 2 sprays into both nostrils daily as needed. USE 2 SPRAY(S) IN EACH NOSTRIL AT BEDTIME)   Fluticasone-Umeclidin-Vilant (TRELEGY ELLIPTA) 100-62.5-25 MCG/INH AEPB Take 1 puff by mouth daily.   gabapentin (NEURONTIN) 300 MG capsule Take 2 capsules (600 mg total) by mouth 2 (two) times daily.   Insulin Degludec (TRESIBA) 100 UNIT/ML SOLN Inject 12-30 units of lipase into the skin daily. To keep fasting glucose between 100-140   Insulin Pen Needle 31G X 8 MM MISC 1 each by Does not apply route daily.   lidocaine-prilocaine (EMLA) cream Apply to affected area once (Patient not taking: Reported on 05/26/2021)   losartan (COZAAR) 25 MG tablet Take 1 tablet (25 mg total) by mouth daily.   ondansetron (ZOFRAN) 8 MG tablet Take 1 tablet (8 mg total) by mouth 2 (two) times daily as needed. Start on the third day after cisplatin chemotherapy. (Patient not taking: Reported on 05/26/2021)  prochlorperazine (COMPAZINE) 10 MG tablet Take 1 tablet (10 mg total) by mouth every 6 (six) hours as needed (Nausea or vomiting). (Patient not taking: Reported on 05/26/2021)   No facility-administered encounter medications on file as of 06/23/2021.    Patient Active Problem List   Diagnosis Date Noted   Screen for colon cancer    Rectal polyp    Polyp of sigmoid  colon    Abnormal findings on diagnostic imaging of digestive system    Gastritis with hemorrhage    Polyp of duodenum    Intrahepatic cholangiocarcinoma ([place]) 04/12/2021   Goals of care, counseling/discussion 04/12/2021   Mild episode of recurrent major depressive disorder ([place]) 02/08/2019   Dyslipidemia associated with type 2 diabetes mellitus ([place]) 12/17/2017   Noncompliance with medication regimen 12/17/2017   Spinal stenosis 10/15/2016   Diabetic polyneuropathy associated with type 2 diabetes mellitus ([place]) 07/14/2016   Chronic midline low back pain with sciatica 06/22/2016   Primary osteoarthritis of both knees 06/22/2016   Carpal tunnel syndrome 07/01/2015   Cervical radiculitis 07/01/2015   Corn or callus 07/01/2015   Impingement syndrome of shoulder 07/01/2015   Microalbuminuria 07/01/2015   Compulsive tobacco user syndrome 07/01/2015   Tenosynovitis of wrist 07/01/2015   Benign hypertension 11/05/2009   Uncontrolled type 2 diabetes mellitus with microalbuminuria ([place]) 11/05/2009   Athlete's foot 08/05/2009   Combined fat and carbohydrate induced hyperlipemia 01/08/2009   Vitamin D deficiency 01/08/2009   Asthma, mild intermittent 12/28/2008   Allergic rhinitis 12/28/2008   Lumbosacral neuritis 08/11/2007    Conditions to be addressed/monitored: Depression; Financial constraints  Care Plan : Depression (Adult)  Updates made by Vern Claude, LCSW since 06/23/2021 12:00 AM     Problem: Symptoms (Depression)      Long-Range Goal: Symptoms Monitored and Managed   Start Date: 05/02/2021  Expected End Date: 12/02/2021  Recent Progress: On track  Priority: Medium  Note:   Current Barriers:  Acute Mental Health needs related to recent diagnosis Mental Health Concerns  Suicidal Ideation/Homicidal Ideation: No  Clinical Social Work Goal(s):  Over the next 90 days, patient will work with SW bi-weekly by telephone or in person to reduce or manage symptoms related to  depression patient will work with SW to address concerns related to depressed mood due to recent cancer diagnosis  Interventions: SDOH Interventions    Flowsheet Row Most Recent Value  SDOH Interventions   SDOH Interventions for the Following Domains Depression  Depression Interventions/Treatment  Counseling, Medication     Follow up phone call to patient regarding her recent diagnosis Patient discussed feeling much better since last contact-stating that she "is putting it in God's hands" Confirmed initial start of her treatments through the [facility] and so far, doing well. Patient confirms needing assistance with a utility bill, patient has an appointment with the [facility] on 06/23/21, patient encouraged to explore any possible assistance with the social worker at the [facility] Patient encouraged to verbalize her feeling regarding recent diagnosis, emotional support provided, however seemed guarded today Self care emphasized, positive coping discussed and reinforced including consideration of long term mental health counseling Discussed plans with patient for ongoing care management follow up and provided patient with direct contact information for care management team  06/23/21 Return phone call from [facility] to confirm that he spoke with patient today and will submit request to assist with her past due water bill.  Patient Self Care Activities:  Performs ADL's independently Performs IADL's independently Ability for insight Strong family or social support  Patient Coping Strengths:  Supportive Relationships Family Friends Able to Communicate Effectively  Patient Self Care Deficits:  Coping struggles related to recent diagnosis         Follow Up Plan: SW will follow up with patient by phone over the next 14 business days      [place], [place] Worker  [facility] Care  Management [phone]

## 2021-06-23 NOTE — Progress Notes (Signed)
Bilirubin 3.5 and Magnesium 2.7, after reviewing labs, Per Dr. Janese Banks okay to proceed with treatment and hold Magnesium at this time.    Urine output 167ml. Per Dr. Janese Banks okay to proceed with treatment.   1515: Dr. Janese Banks at chairside to discuss Neulasta On Pro. Verbal and written instructions given to pt. Pt verbalizes understanding.

## 2021-06-23 NOTE — Progress Notes (Signed)
Per MD d/c magnesium IV today

## 2021-07-03 DIAGNOSIS — J449 Chronic obstructive pulmonary disease, unspecified: Secondary | ICD-10-CM | POA: Diagnosis not present

## 2021-07-03 DIAGNOSIS — R69 Illness, unspecified: Secondary | ICD-10-CM | POA: Diagnosis not present

## 2021-07-03 DIAGNOSIS — E1142 Type 2 diabetes mellitus with diabetic polyneuropathy: Secondary | ICD-10-CM | POA: Diagnosis not present

## 2021-07-03 DIAGNOSIS — Z794 Long term (current) use of insulin: Secondary | ICD-10-CM | POA: Diagnosis not present

## 2021-07-03 DIAGNOSIS — I10 Essential (primary) hypertension: Secondary | ICD-10-CM | POA: Diagnosis not present

## 2021-07-03 DIAGNOSIS — E785 Hyperlipidemia, unspecified: Secondary | ICD-10-CM | POA: Diagnosis not present

## 2021-07-03 DIAGNOSIS — K08409 Partial loss of teeth, unspecified cause, unspecified class: Secondary | ICD-10-CM | POA: Diagnosis not present

## 2021-07-03 DIAGNOSIS — M1990 Unspecified osteoarthritis, unspecified site: Secondary | ICD-10-CM | POA: Diagnosis not present

## 2021-07-07 ENCOUNTER — Inpatient Hospital Stay (HOSPITAL_BASED_OUTPATIENT_CLINIC_OR_DEPARTMENT_OTHER): Payer: Medicare HMO | Admitting: Oncology

## 2021-07-07 ENCOUNTER — Inpatient Hospital Stay: Payer: Medicare HMO

## 2021-07-07 ENCOUNTER — Encounter: Payer: Self-pay | Admitting: Oncology

## 2021-07-07 ENCOUNTER — Inpatient Hospital Stay: Payer: Medicare HMO | Attending: Oncology

## 2021-07-07 VITALS — BP 122/70 | HR 82 | Temp 96.6°F | Wt 149.0 lb

## 2021-07-07 DIAGNOSIS — Z7982 Long term (current) use of aspirin: Secondary | ICD-10-CM | POA: Diagnosis not present

## 2021-07-07 DIAGNOSIS — R7989 Other specified abnormal findings of blood chemistry: Secondary | ICD-10-CM

## 2021-07-07 DIAGNOSIS — C221 Intrahepatic bile duct carcinoma: Secondary | ICD-10-CM | POA: Insufficient documentation

## 2021-07-07 DIAGNOSIS — E119 Type 2 diabetes mellitus without complications: Secondary | ICD-10-CM | POA: Diagnosis not present

## 2021-07-07 DIAGNOSIS — D649 Anemia, unspecified: Secondary | ICD-10-CM | POA: Insufficient documentation

## 2021-07-07 DIAGNOSIS — R945 Abnormal results of liver function studies: Secondary | ICD-10-CM

## 2021-07-07 DIAGNOSIS — R69 Illness, unspecified: Secondary | ICD-10-CM | POA: Diagnosis not present

## 2021-07-07 DIAGNOSIS — I10 Essential (primary) hypertension: Secondary | ICD-10-CM | POA: Diagnosis not present

## 2021-07-07 DIAGNOSIS — Z5111 Encounter for antineoplastic chemotherapy: Secondary | ICD-10-CM | POA: Insufficient documentation

## 2021-07-07 DIAGNOSIS — Z79899 Other long term (current) drug therapy: Secondary | ICD-10-CM | POA: Diagnosis not present

## 2021-07-07 DIAGNOSIS — D729 Disorder of white blood cells, unspecified: Secondary | ICD-10-CM

## 2021-07-07 DIAGNOSIS — F17210 Nicotine dependence, cigarettes, uncomplicated: Secondary | ICD-10-CM | POA: Insufficient documentation

## 2021-07-07 DIAGNOSIS — Z90710 Acquired absence of both cervix and uterus: Secondary | ICD-10-CM | POA: Insufficient documentation

## 2021-07-07 DIAGNOSIS — E785 Hyperlipidemia, unspecified: Secondary | ICD-10-CM | POA: Diagnosis not present

## 2021-07-07 LAB — COMPREHENSIVE METABOLIC PANEL
ALT: 36 U/L (ref 0–44)
AST: 49 U/L — ABNORMAL HIGH (ref 15–41)
Albumin: 2.8 g/dL — ABNORMAL LOW (ref 3.5–5.0)
Alkaline Phosphatase: 826 U/L — ABNORMAL HIGH (ref 38–126)
Anion gap: 10 (ref 5–15)
BUN: 13 mg/dL (ref 8–23)
CO2: 23 mmol/L (ref 22–32)
Calcium: 8.7 mg/dL — ABNORMAL LOW (ref 8.9–10.3)
Chloride: 102 mmol/L (ref 98–111)
Creatinine, Ser: 0.99 mg/dL (ref 0.44–1.00)
GFR, Estimated: >60 mL/min (ref >60)
Glucose, Bld: 141 mg/dL — ABNORMAL HIGH (ref 70–99)
Potassium: 3.5 mmol/L (ref 3.5–5.1)
Sodium: 135 mmol/L (ref 135–145)
Total Bilirubin: 2.1 mg/dL — ABNORMAL HIGH (ref 0.3–1.2)
Total Protein: 7.9 g/dL (ref 6.5–8.1)

## 2021-07-07 LAB — CBC WITH DIFFERENTIAL/PLATELET
Abs Immature Granulocytes: 5.21 10*3/uL — ABNORMAL HIGH (ref 0.00–0.07)
Basophils Absolute: 0.1 10*3/uL (ref 0.0–0.1)
Basophils Relative: 0 %
Eosinophils Absolute: 0.4 10*3/uL (ref 0.0–0.5)
Eosinophils Relative: 1 %
HCT: 32 % — ABNORMAL LOW (ref 36.0–46.0)
Hemoglobin: 10.2 g/dL — ABNORMAL LOW (ref 12.0–15.0)
Immature Granulocytes: 11 %
Lymphocytes Relative: 10 %
Lymphs Abs: 4.4 10*3/uL — ABNORMAL HIGH (ref 0.7–4.0)
MCH: 33.3 pg (ref 26.0–34.0)
MCHC: 31.9 g/dL (ref 30.0–36.0)
MCV: 104.6 fL — ABNORMAL HIGH (ref 80.0–100.0)
Monocytes Absolute: 2.3 10*3/uL — ABNORMAL HIGH (ref 0.1–1.0)
Monocytes Relative: 5 %
Neutro Abs: 34 10*3/uL — ABNORMAL HIGH (ref 1.7–7.7)
Neutrophils Relative %: 73 %
Platelets: 282 10*3/uL (ref 150–400)
RBC: 3.06 MIL/uL — ABNORMAL LOW (ref 3.87–5.11)
RDW: 20.6 % — ABNORMAL HIGH (ref 11.5–15.5)
Smear Review: ADEQUATE
WBC: 46.4 10*3/uL — ABNORMAL HIGH (ref 4.0–10.5)
nRBC: 1.3 % — ABNORMAL HIGH (ref 0.0–0.2)

## 2021-07-07 LAB — MAGNESIUM: Magnesium: 2.4 mg/dL (ref 1.7–2.4)

## 2021-07-07 MED ORDER — SODIUM CHLORIDE 0.9 % IV SOLN
Freq: Once | INTRAVENOUS | Status: AC
  Filled 2021-07-07: qty 250

## 2021-07-07 MED ORDER — PALONOSETRON HCL INJECTION 0.25 MG/5ML
0.2500 mg | Freq: Once | INTRAVENOUS | Status: AC
  Administered 2021-07-07: 0.25 mg via INTRAVENOUS
  Filled 2021-07-07: qty 5

## 2021-07-07 MED ORDER — POTASSIUM CHLORIDE IN NACL 20-0.9 MEQ/L-% IV SOLN
Freq: Once | INTRAVENOUS | Status: AC
Start: 2021-07-07 — End: 2021-07-07
  Filled 2021-07-07: qty 1000

## 2021-07-07 MED ORDER — SODIUM CHLORIDE 0.9 % IV SOLN
10.0000 mg | Freq: Once | INTRAVENOUS | Status: AC
  Administered 2021-07-07: 10 mg via INTRAVENOUS
  Filled 2021-07-07: qty 10

## 2021-07-07 MED ORDER — SODIUM CHLORIDE 0.9 % IV SOLN
35.0000 mg/m2 | Freq: Once | INTRAVENOUS | Status: AC
  Administered 2021-07-07: 63 mg via INTRAVENOUS
  Filled 2021-07-07: qty 63

## 2021-07-07 MED ORDER — SODIUM CHLORIDE 0.9 % IV SOLN
1400.0000 mg | Freq: Once | INTRAVENOUS | Status: AC
  Administered 2021-07-07: 1400 mg via INTRAVENOUS
  Filled 2021-07-07: qty 10.52

## 2021-07-07 MED ORDER — SODIUM CHLORIDE 0.9 % IV SOLN
150.0000 mg | Freq: Once | INTRAVENOUS | Status: AC
  Administered 2021-07-07: 150 mg via INTRAVENOUS
  Filled 2021-07-07: qty 150

## 2021-07-07 MED ORDER — MAGNESIUM SULFATE 2 GM/50ML IV SOLN
2.0000 g | Freq: Once | INTRAVENOUS | Status: AC
  Administered 2021-07-07: 2 g via INTRAVENOUS
  Filled 2021-07-07: qty 50

## 2021-07-07 NOTE — Progress Notes (Signed)
Hematology/Oncology Consult note [facility]  Telephone:([phone] [phone]  Patient Care Team: Steele Sizer, MD as PCP - General (Family Medicine) Neldon Labella, RN as Case Manager Clent Jacks, RN as Oncology Nurse Navigator [place], [place], LCSW as Social Worker Germaine Pomfret, [facility] (Pharmacist)   Name of the patient: Janice Short  [id number]  08-Aug-1961   Date of visit: 07/07/21  Diagnosis- stage IV intrahepatic cholangiocarcinoma  Chief complaint/ Reason for visit-on treatment assessment prior to cycle 3-day 1 of gemcitabine cisplatin chemotherapy  Heme/Onc history: Patient is a 60 year old African-American female with a past medical history significant for hypertension hyperlipidemia and type 2 diabetes who presented to the ER with symptoms of cramping abdominal pain and nausea.  This was followed by right upper quadrant ultrasound which showed multiple echogenic masses in the liver with nodular contour of the liver possibly suggestive of cirrhosis.  Several nodular masses in the vicinity of the pancreatic head and porta hepatic lymph nodes.  This was followed by an MRI abdomen and MRCP.  It showed right hepatic lobe masslike area measuring 4.9 x 2.9 cm.  Diffusion abnormality in the left hepatic lobe with mild left-sided biliary ductal dilatation and another area of abnormality measuring 1.4 x 1 cm.  Bulky celiac adenopathy 3 x 2.1 cm tracking into gastrohepatic recess.  Suspected portal venous invasion with convex protrusion in the right portal vein.  Left adrenal lesion measuring 2.9 x 1.5 cm.  Findings concerning for cholangiocarcinoma or biphenotypic cholangiole hepatocellular neoplasm.     Patient underwent CT-guided liver biopsy Which was consistent with adenocarcinoma poorly differentiated.  Immunohistochemistry showed CK7 positivity, CDX2 negative, Heppar negative, GATA3 negative.  CK19 positive.  Differentials include  cholangiocarcinoma, pancreatic, upper GI.  Lung and breast less likely.  However given CK19 positivity most compatible with cholangiocarcinoma.   Patient's case was discussed at tumor board and it was felt as if the lymphadenopathy was more like a confluent primary tumor mass.  Patient was referred to [facility] for second opinion to see if she would be a candidate for surgery.However upon their review of the PET scan it was deemed that this was indeed periportal adenopathy.  They also did a CT chest abdomen and pelvis with MIPS protocol and she was also found to have portocaval lymph node measuring 2.4 cm which was more consistent with metastatic disease.  She was deemed unresectable   NGS testing showed BRCA2, FGFR2, PIK3CA, RAD 54L, CDC73, CDKN2 A/B CR EBBP rearrangement exon 31       Interval history-patient reports doing well and denies any specific complaints at this time.  Appetite and weight have remained stable.  Denies any nausea vomiting with chemotherapy.  Denies any peripheral neuropathy.  Denies any abdominal pain  ECOG PS- 1 Pain scale- 0   Review of systems- Review of Systems  Constitutional:  Negative for chills, fever, malaise/fatigue and weight loss.  HENT:  Negative for congestion, ear discharge and nosebleeds.   Eyes:  Negative for blurred vision.  Respiratory:  Negative for cough, hemoptysis, sputum production, shortness of breath and wheezing.   Cardiovascular:  Negative for chest pain, palpitations, orthopnea and claudication.  Gastrointestinal:  Negative for abdominal pain, blood in stool, constipation, diarrhea, heartburn, melena, nausea and vomiting.  Genitourinary:  Negative for dysuria, flank pain, frequency, hematuria and urgency.  Musculoskeletal:  Negative for back pain, joint pain and myalgias.  Skin:  Negative for rash.  Neurological:  Negative for dizziness, tingling, focal  weakness, seizures, weakness and headaches.  Endo/Heme/Allergies:  Does not bruise/bleed  easily.  Psychiatric/Behavioral:  Negative for depression and suicidal ideas. The patient does not have insomnia.      Allergies  Allergen Reactions   Other Shortness Of Breath    Cat dander and grass pollen   Bydureon [Exenatide] Other (See Comments)    Pain with injection not allergy     Past Medical History:  Diagnosis Date   Allergy    Asthma    Carpal tunnel syndrome on left    Cervical radiculitis    Chronic osteoarthritis    Corns and callosity    Depression    Dermatophytosis of foot    Diabetes mellitus without complication (HCC)    Gout    Hyperlipidemia    Hypertension    Impingement syndrome of left shoulder    Intrahepatic cholangiocarcinoma (HCC)    Lumbosacral neuritis    Microalbuminuria    Obesity    Supraventricular tachycardia ([place]) 07/01/2015   SVT (supraventricular tachycardia) (HCC)    Tenosynovitis of wrist    Vitamin D deficiency      Past Surgical History:  Procedure Laterality Date   ABDOMINAL HYSTERECTOMY     COLONOSCOPY WITH PROPOFOL N/A 05/06/2021   Procedure: COLONOSCOPY WITH PROPOFOL;  Surgeon: Lucilla Lame, MD;  Location: [facility] ENDOSCOPY;  Service: Endoscopy;  Laterality: N/A;   ESOPHAGOGASTRODUODENOSCOPY (EGD) WITH PROPOFOL N/A 05/06/2021   Procedure: ESOPHAGOGASTRODUODENOSCOPY (EGD) WITH PROPOFOL;  Surgeon: Lucilla Lame, MD;  Location: [facility] ENDOSCOPY;  Service: Endoscopy;  Laterality: N/A;    Social History   Socioeconomic History   Marital status: Single    Spouse name: Not on file   Number of children: 2   Years of education: Not on file   Highest education level: Not on file  Occupational History   Occupation: disable     Comment: from chronic back pain, 2019  Tobacco Use   Smoking status: Some Days    Packs/day: 0.25    Types: Cigarettes   Smokeless tobacco: Never   Tobacco comments:    2 per day 01/30/21  Vaping Use   Vaping Use: Never used  Substance and Sexual Activity   Alcohol use: Yes    Alcohol/week: 0.0  standard drinks    Comment: rare maybe 1-2  year   Drug use: No   Sexual activity: Yes  Other Topics Concern   Not on file  Social History Narrative   She is on disability for chronic back pain. Pt's son lives with her   Social Determinants of Health   Financial Resource Strain: Medium Risk   Difficulty of Paying Living Expenses: Somewhat hard  Food Insecurity: No Food Insecurity   Worried About Charity fundraiser in the Last Year: Never true   Arboriculturist in the Last Year: Never true  Transportation Needs: No Transportation Needs   Lack of Transportation (Medical): No   Lack of Transportation (Non-Medical): No  Physical Activity: Inactive   Days of Exercise per Week: 0 days   Minutes of Exercise per Session: 0 min  Stress: Stress Concern Present   Feeling of Stress : To some extent  Social Connections: Moderately Isolated   Frequency of Communication with Friends and Family: More than three times a week   Frequency of Social Gatherings with Friends and Family: Three times a week   Attends Religious Services: More than 4 times per year   Active Member of Clubs or Organizations: No  Attends Archivist Meetings: Never   Marital Status: Never married  Human resources officer Violence: Not At Risk   Fear of Current or Ex-Partner: No   Emotionally Abused: No   Physically Abused: No   Sexually Abused: No    Family History  Problem Relation Age of Onset   Heart attack Mother    CAD Mother    Kidney disease Father      Current Outpatient Medications:    albuterol (VENTOLIN HFA) 108 (90 Base) MCG/ACT inhaler, INHALE 2 PUFFS BY MOUTH EVERY 6 HOURS AS NEEDED FOR WHEEZING AND FOR SHORTNESS OF BREATH, Disp: 18 g, Rfl: 1   aspirin 81 MG tablet, Take 1 tablet by mouth daily., Disp: , Rfl:    atorvastatin (LIPITOR) 40 MG tablet, Take 1 tablet (40 mg total) by mouth at bedtime., Disp: 90 tablet, Rfl: 1   blood glucose meter kit and supplies, Dispense based on patient and  insurance preference. Use up to four times daily as directed. (FOR ICD-10 E10.9, E11.9)., Disp: 1 each, Rfl: 0   diphenhydramine-acetaminophen (TYLENOL PM) 25-500 MG TABS tablet, Take 1 tablet by mouth at bedtime as needed., Disp: , Rfl:    Empagliflozin-metFORMIN HCl ER (SYNJARDY XR) 12.04-999 MG TB24, Take 2 tablets by mouth daily. New dose, Disp: 180 tablet, Rfl: 1   escitalopram (LEXAPRO) 10 MG tablet, Take 1 tablet (10 mg total) by mouth daily., Disp: 90 tablet, Rfl: 0   fluticasone (FLONASE) 50 MCG/ACT nasal spray, USE 2 SPRAY(S) IN EACH NOSTRIL AT BEDTIME (Patient taking differently: Place 2 sprays into both nostrils daily as needed. USE 2 SPRAY(S) IN EACH NOSTRIL AT BEDTIME), Disp: 16 g, Rfl: 0   Fluticasone-Umeclidin-Vilant (TRELEGY ELLIPTA) 100-62.5-25 MCG/INH AEPB, Take 1 puff by mouth daily., Disp: 180 each, Rfl: 1   gabapentin (NEURONTIN) 300 MG capsule, Take 2 capsules (600 mg total) by mouth 2 (two) times daily., Disp: 360 capsule, Rfl: 1   Insulin Degludec (TRESIBA) 100 UNIT/ML SOLN, Inject 12-30 units of lipase into the skin daily. To keep fasting glucose between 100-140, Disp: 15 mL, Rfl: 0   Insulin Pen Needle 31G X 8 MM MISC, 1 each by Does not apply route daily., Disp: 100 each, Rfl: 1   losartan (COZAAR) 25 MG tablet, Take 1 tablet (25 mg total) by mouth daily., Disp: 90 tablet, Rfl: 0   dexamethasone (DECADRON) 4 MG tablet, Take 2 tablets (8 mg total) by mouth daily. Take daily x 3 days starting the day after cisplatin chemotherapy. Take with food. (Patient not taking: No sig reported), Disp: 30 tablet, Rfl: 1   lidocaine-prilocaine (EMLA) cream, Apply to affected area once (Patient not taking: No sig reported), Disp: 30 g, Rfl: 3   ondansetron (ZOFRAN) 8 MG tablet, Take 1 tablet (8 mg total) by mouth 2 (two) times daily as needed. Start on the third day after cisplatin chemotherapy. (Patient not taking: No sig reported), Disp: 30 tablet, Rfl: 1   prochlorperazine (COMPAZINE) 10  MG tablet, Take 1 tablet (10 mg total) by mouth every 6 (six) hours as needed (Nausea or vomiting). (Patient not taking: No sig reported), Disp: 30 tablet, Rfl: 1 No current facility-administered medications for this visit.  Facility-Administered Medications Ordered in Other Visits:    CISplatin (PLATINOL) 63 mg in sodium chloride 0.9 % 250 mL chemo infusion, 35 mg/m2 (Treatment Plan Recorded), Intravenous, Once, Sindy Guadeloupe, MD   dexamethasone (DECADRON) 10 mg in sodium chloride 0.9 % 50 mL IVPB, 10 mg, Intravenous, Once,  Sindy Guadeloupe, MD   fosaprepitant (EMEND) 150 mg in sodium chloride 0.9 % 145 mL IVPB, 150 mg, Intravenous, Once, Sindy Guadeloupe, MD   gemcitabine (GEMZAR) 1,400 mg in sodium chloride 0.9 % 250 mL chemo infusion, 1,400 mg, Intravenous, Once, Sindy Guadeloupe, MD   magnesium sulfate IVPB 2 g 50 mL, 2 g, Intravenous, Once, Sindy Guadeloupe, MD, Last Rate: 50 mL/hr at 07/07/21 0941, 2 g at 07/07/21 0941   palonosetron (ALOXI) injection 0.25 mg, 0.25 mg, Intravenous, Once, Sindy Guadeloupe, MD  Physical exam:  Vitals:   07/07/21 0834  BP: 122/70  Pulse: 82  Temp: (!) 96.6 F (35.9 C)  SpO2: 100%  Weight: 149 lb (67.6 kg)   Physical Exam HENT:     Head: Normocephalic and atraumatic.  Eyes:     Pupils: Pupils are equal, round, and reactive to light.     Comments: Sclerae appear mildly icteric  Cardiovascular:     Rate and Rhythm: Normal rate and regular rhythm.     Heart sounds: Normal heart sounds.  Pulmonary:     Effort: Pulmonary effort is normal.     Breath sounds: Normal breath sounds.  Abdominal:     General: Bowel sounds are normal.     Palpations: Abdomen is soft.  Skin:    General: Skin is warm and dry.  Neurological:     Mental Status: She is alert and oriented to person, place, and time.     CMP Latest Ref Rng & Units 07/07/2021  Glucose 70 - 99 mg/dL 141(H)  BUN 8 - 23 mg/dL 13  Creatinine 0.44 - 1.00 mg/dL 0.99  Sodium 135 - 145 mmol/L 135   Potassium 3.5 - 5.1 mmol/L 3.5  Chloride 98 - 111 mmol/L 102  CO2 22 - 32 mmol/L 23  Calcium 8.9 - 10.3 mg/dL 8.7(L)  Total Protein 6.5 - 8.1 g/dL 7.9  Total Bilirubin 0.3 - 1.2 mg/dL 2.1(H)  Alkaline Phos 38 - 126 U/L 826(H)  AST 15 - 41 U/L 49(H)  ALT 0 - 44 U/L 36   CBC Latest Ref Rng & Units 07/07/2021  WBC 4.0 - 10.5 K/uL 46.4(H)  Hemoglobin 12.0 - 15.0 g/dL 10.2(L)  Hematocrit 36.0 - 46.0 % 32.0(L)  Platelets 150 - 400 K/uL 282      Assessment and plan- Patient is a 60 y.o. female  with stage IV intrahepatic cholangiocarcinoma.  She is here for on treatment assessment prior to cycle 3-day 1 of gemcitabine cisplatin chemotherapy  Neutrophilia likely secondary to Neulasta that she received 2 weeks ago.  No signs and symptoms of infection.  She will proceed with gemcitabine cisplatin chemotherapy today and return to clinic in 1 week for cycle 3-day 8.  Plan to give 4 cycles before getting repeat scans.  Bilirubin is lower as compared to before down to 2.1 from a prior value of 3.5.  ALT has normalized and AST is trending down.  Alkaline phosphatase continues to be high.  Normocytic anemia: Likely secondary to chronic disease.  Iron studies B12 and folate are normal . Visit Diagnosis 1. Intrahepatic cholangiocarcinoma ([place])   2. Abnormal LFTs   3. Encounter for antineoplastic chemotherapy   4. Neutrophilia      Dr. Randa Evens, MD, MPH [facility] at [facility] [phone] 07/07/2021 10:13 AM

## 2021-07-07 NOTE — H&P (View-Only) (Signed)
Hematology/Oncology Consult note [facility]  Telephone:([phone] [phone]  Patient Care Team: Steele Sizer, MD as PCP - General (Family Medicine) Neldon Labella, RN as Case Manager Clent Jacks, RN as Oncology Nurse Navigator [place], [place], LCSW as Social Worker Germaine Pomfret, [facility] (Pharmacist)   Name of the patient: Janice Short  [id number]  08-Aug-1961   Date of visit: 07/07/21  Diagnosis- stage IV intrahepatic cholangiocarcinoma  Chief complaint/ Reason for visit-on treatment assessment prior to cycle 3-day 1 of gemcitabine cisplatin chemotherapy  Heme/Onc history: Patient is a 60 year old African-American female with a past medical history significant for hypertension hyperlipidemia and type 2 diabetes who presented to the ER with symptoms of cramping abdominal pain and nausea.  This was followed by right upper quadrant ultrasound which showed multiple echogenic masses in the liver with nodular contour of the liver possibly suggestive of cirrhosis.  Several nodular masses in the vicinity of the pancreatic head and porta hepatic lymph nodes.  This was followed by an MRI abdomen and MRCP.  It showed right hepatic lobe masslike area measuring 4.9 x 2.9 cm.  Diffusion abnormality in the left hepatic lobe with mild left-sided biliary ductal dilatation and another area of abnormality measuring 1.4 x 1 cm.  Bulky celiac adenopathy 3 x 2.1 cm tracking into gastrohepatic recess.  Suspected portal venous invasion with convex protrusion in the right portal vein.  Left adrenal lesion measuring 2.9 x 1.5 cm.  Findings concerning for cholangiocarcinoma or biphenotypic cholangiole hepatocellular neoplasm.     Patient underwent CT-guided liver biopsy Which was consistent with adenocarcinoma poorly differentiated.  Immunohistochemistry showed CK7 positivity, CDX2 negative, Heppar negative, GATA3 negative.  CK19 positive.  Differentials include  cholangiocarcinoma, pancreatic, upper GI.  Lung and breast less likely.  However given CK19 positivity most compatible with cholangiocarcinoma.   Patient's case was discussed at tumor board and it was felt as if the lymphadenopathy was more like a confluent primary tumor mass.  Patient was referred to [facility] for second opinion to see if she would be a candidate for surgery.However upon their review of the PET scan it was deemed that this was indeed periportal adenopathy.  They also did a CT chest abdomen and pelvis with MIPS protocol and she was also found to have portocaval lymph node measuring 2.4 cm which was more consistent with metastatic disease.  She was deemed unresectable   NGS testing showed BRCA2, FGFR2, PIK3CA, RAD 54L, CDC73, CDKN2 A/B CR EBBP rearrangement exon 31       Interval history-patient reports doing well and denies any specific complaints at this time.  Appetite and weight have remained stable.  Denies any nausea vomiting with chemotherapy.  Denies any peripheral neuropathy.  Denies any abdominal pain  ECOG PS- 1 Pain scale- 0   Review of systems- Review of Systems  Constitutional:  Negative for chills, fever, malaise/fatigue and weight loss.  HENT:  Negative for congestion, ear discharge and nosebleeds.   Eyes:  Negative for blurred vision.  Respiratory:  Negative for cough, hemoptysis, sputum production, shortness of breath and wheezing.   Cardiovascular:  Negative for chest pain, palpitations, orthopnea and claudication.  Gastrointestinal:  Negative for abdominal pain, blood in stool, constipation, diarrhea, heartburn, melena, nausea and vomiting.  Genitourinary:  Negative for dysuria, flank pain, frequency, hematuria and urgency.  Musculoskeletal:  Negative for back pain, joint pain and myalgias.  Skin:  Negative for rash.  Neurological:  Negative for dizziness, tingling, focal  weakness, seizures, weakness and headaches.  Endo/Heme/Allergies:  Does not bruise/bleed  easily.  Psychiatric/Behavioral:  Negative for depression and suicidal ideas. The patient does not have insomnia.      Allergies  Allergen Reactions   Other Shortness Of Breath    Cat dander and grass pollen   Bydureon [Exenatide] Other (See Comments)    Pain with injection not allergy     Past Medical History:  Diagnosis Date   Allergy    Asthma    Carpal tunnel syndrome on left    Cervical radiculitis    Chronic osteoarthritis    Corns and callosity    Depression    Dermatophytosis of foot    Diabetes mellitus without complication (HCC)    Gout    Hyperlipidemia    Hypertension    Impingement syndrome of left shoulder    Intrahepatic cholangiocarcinoma (HCC)    Lumbosacral neuritis    Microalbuminuria    Obesity    Supraventricular tachycardia ([place]) 07/01/2015   SVT (supraventricular tachycardia) (HCC)    Tenosynovitis of wrist    Vitamin D deficiency      Past Surgical History:  Procedure Laterality Date   ABDOMINAL HYSTERECTOMY     COLONOSCOPY WITH PROPOFOL N/A 05/06/2021   Procedure: COLONOSCOPY WITH PROPOFOL;  Surgeon: Lucilla Lame, MD;  Location: [facility] ENDOSCOPY;  Service: Endoscopy;  Laterality: N/A;   ESOPHAGOGASTRODUODENOSCOPY (EGD) WITH PROPOFOL N/A 05/06/2021   Procedure: ESOPHAGOGASTRODUODENOSCOPY (EGD) WITH PROPOFOL;  Surgeon: Lucilla Lame, MD;  Location: [facility] ENDOSCOPY;  Service: Endoscopy;  Laterality: N/A;    Social History   Socioeconomic History   Marital status: Single    Spouse name: Not on file   Number of children: 2   Years of education: Not on file   Highest education level: Not on file  Occupational History   Occupation: disable     Comment: from chronic back pain, 2019  Tobacco Use   Smoking status: Some Days    Packs/day: 0.25    Types: Cigarettes   Smokeless tobacco: Never   Tobacco comments:    2 per day 01/30/21  Vaping Use   Vaping Use: Never used  Substance and Sexual Activity   Alcohol use: Yes    Alcohol/week: 0.0  standard drinks    Comment: rare maybe 1-2  year   Drug use: No   Sexual activity: Yes  Other Topics Concern   Not on file  Social History Narrative   She is on disability for chronic back pain. Pt's son lives with her   Social Determinants of Health   Financial Resource Strain: Medium Risk   Difficulty of Paying Living Expenses: Somewhat hard  Food Insecurity: No Food Insecurity   Worried About Charity fundraiser in the Last Year: Never true   Arboriculturist in the Last Year: Never true  Transportation Needs: No Transportation Needs   Lack of Transportation (Medical): No   Lack of Transportation (Non-Medical): No  Physical Activity: Inactive   Days of Exercise per Week: 0 days   Minutes of Exercise per Session: 0 min  Stress: Stress Concern Present   Feeling of Stress : To some extent  Social Connections: Moderately Isolated   Frequency of Communication with Friends and Family: More than three times a week   Frequency of Social Gatherings with Friends and Family: Three times a week   Attends Religious Services: More than 4 times per year   Active Member of Clubs or Organizations: No  Attends Archivist Meetings: Never   Marital Status: Never married  Human resources officer Violence: Not At Risk   Fear of Current or Ex-Partner: No   Emotionally Abused: No   Physically Abused: No   Sexually Abused: No    Family History  Problem Relation Age of Onset   Heart attack Mother    CAD Mother    Kidney disease Father      Current Outpatient Medications:    albuterol (VENTOLIN HFA) 108 (90 Base) MCG/ACT inhaler, INHALE 2 PUFFS BY MOUTH EVERY 6 HOURS AS NEEDED FOR WHEEZING AND FOR SHORTNESS OF BREATH, Disp: 18 g, Rfl: 1   aspirin 81 MG tablet, Take 1 tablet by mouth daily., Disp: , Rfl:    atorvastatin (LIPITOR) 40 MG tablet, Take 1 tablet (40 mg total) by mouth at bedtime., Disp: 90 tablet, Rfl: 1   blood glucose meter kit and supplies, Dispense based on patient and  insurance preference. Use up to four times daily as directed. (FOR ICD-10 E10.9, E11.9)., Disp: 1 each, Rfl: 0   diphenhydramine-acetaminophen (TYLENOL PM) 25-500 MG TABS tablet, Take 1 tablet by mouth at bedtime as needed., Disp: , Rfl:    Empagliflozin-metFORMIN HCl ER (SYNJARDY XR) 12.04-999 MG TB24, Take 2 tablets by mouth daily. New dose, Disp: 180 tablet, Rfl: 1   escitalopram (LEXAPRO) 10 MG tablet, Take 1 tablet (10 mg total) by mouth daily., Disp: 90 tablet, Rfl: 0   fluticasone (FLONASE) 50 MCG/ACT nasal spray, USE 2 SPRAY(S) IN EACH NOSTRIL AT BEDTIME (Patient taking differently: Place 2 sprays into both nostrils daily as needed. USE 2 SPRAY(S) IN EACH NOSTRIL AT BEDTIME), Disp: 16 g, Rfl: 0   Fluticasone-Umeclidin-Vilant (TRELEGY ELLIPTA) 100-62.5-25 MCG/INH AEPB, Take 1 puff by mouth daily., Disp: 180 each, Rfl: 1   gabapentin (NEURONTIN) 300 MG capsule, Take 2 capsules (600 mg total) by mouth 2 (two) times daily., Disp: 360 capsule, Rfl: 1   Insulin Degludec (TRESIBA) 100 UNIT/ML SOLN, Inject 12-30 units of lipase into the skin daily. To keep fasting glucose between 100-140, Disp: 15 mL, Rfl: 0   Insulin Pen Needle 31G X 8 MM MISC, 1 each by Does not apply route daily., Disp: 100 each, Rfl: 1   losartan (COZAAR) 25 MG tablet, Take 1 tablet (25 mg total) by mouth daily., Disp: 90 tablet, Rfl: 0   dexamethasone (DECADRON) 4 MG tablet, Take 2 tablets (8 mg total) by mouth daily. Take daily x 3 days starting the day after cisplatin chemotherapy. Take with food. (Patient not taking: No sig reported), Disp: 30 tablet, Rfl: 1   lidocaine-prilocaine (EMLA) cream, Apply to affected area once (Patient not taking: No sig reported), Disp: 30 g, Rfl: 3   ondansetron (ZOFRAN) 8 MG tablet, Take 1 tablet (8 mg total) by mouth 2 (two) times daily as needed. Start on the third day after cisplatin chemotherapy. (Patient not taking: No sig reported), Disp: 30 tablet, Rfl: 1   prochlorperazine (COMPAZINE) 10  MG tablet, Take 1 tablet (10 mg total) by mouth every 6 (six) hours as needed (Nausea or vomiting). (Patient not taking: No sig reported), Disp: 30 tablet, Rfl: 1 No current facility-administered medications for this visit.  Facility-Administered Medications Ordered in Other Visits:    CISplatin (PLATINOL) 63 mg in sodium chloride 0.9 % 250 mL chemo infusion, 35 mg/m2 (Treatment Plan Recorded), Intravenous, Once, Sindy Guadeloupe, MD   dexamethasone (DECADRON) 10 mg in sodium chloride 0.9 % 50 mL IVPB, 10 mg, Intravenous, Once,  Sindy Guadeloupe, MD   fosaprepitant (EMEND) 150 mg in sodium chloride 0.9 % 145 mL IVPB, 150 mg, Intravenous, Once, Sindy Guadeloupe, MD   gemcitabine (GEMZAR) 1,400 mg in sodium chloride 0.9 % 250 mL chemo infusion, 1,400 mg, Intravenous, Once, Sindy Guadeloupe, MD   magnesium sulfate IVPB 2 g 50 mL, 2 g, Intravenous, Once, Sindy Guadeloupe, MD, Last Rate: 50 mL/hr at 07/07/21 0941, 2 g at 07/07/21 0941   palonosetron (ALOXI) injection 0.25 mg, 0.25 mg, Intravenous, Once, Sindy Guadeloupe, MD  Physical exam:  Vitals:   07/07/21 0834  BP: 122/70  Pulse: 82  Temp: (!) 96.6 F (35.9 C)  SpO2: 100%  Weight: 149 lb (67.6 kg)   Physical Exam HENT:     Head: Normocephalic and atraumatic.  Eyes:     Pupils: Pupils are equal, round, and reactive to light.     Comments: Sclerae appear mildly icteric  Cardiovascular:     Rate and Rhythm: Normal rate and regular rhythm.     Heart sounds: Normal heart sounds.  Pulmonary:     Effort: Pulmonary effort is normal.     Breath sounds: Normal breath sounds.  Abdominal:     General: Bowel sounds are normal.     Palpations: Abdomen is soft.  Skin:    General: Skin is warm and dry.  Neurological:     Mental Status: She is alert and oriented to person, place, and time.     CMP Latest Ref Rng & Units 07/07/2021  Glucose 70 - 99 mg/dL 141(H)  BUN 8 - 23 mg/dL 13  Creatinine 0.44 - 1.00 mg/dL 0.99  Sodium 135 - 145 mmol/L 135   Potassium 3.5 - 5.1 mmol/L 3.5  Chloride 98 - 111 mmol/L 102  CO2 22 - 32 mmol/L 23  Calcium 8.9 - 10.3 mg/dL 8.7(L)  Total Protein 6.5 - 8.1 g/dL 7.9  Total Bilirubin 0.3 - 1.2 mg/dL 2.1(H)  Alkaline Phos 38 - 126 U/L 826(H)  AST 15 - 41 U/L 49(H)  ALT 0 - 44 U/L 36   CBC Latest Ref Rng & Units 07/07/2021  WBC 4.0 - 10.5 K/uL 46.4(H)  Hemoglobin 12.0 - 15.0 g/dL 10.2(L)  Hematocrit 36.0 - 46.0 % 32.0(L)  Platelets 150 - 400 K/uL 282      Assessment and plan- Patient is a 60 y.o. female  with stage IV intrahepatic cholangiocarcinoma.  She is here for on treatment assessment prior to cycle 3-day 1 of gemcitabine cisplatin chemotherapy  Neutrophilia likely secondary to Neulasta that she received 2 weeks ago.  No signs and symptoms of infection.  She will proceed with gemcitabine cisplatin chemotherapy today and return to clinic in 1 week for cycle 3-day 8.  Plan to give 4 cycles before getting repeat scans.  Bilirubin is lower as compared to before down to 2.1 from a prior value of 3.5.  ALT has normalized and AST is trending down.  Alkaline phosphatase continues to be high.  Normocytic anemia: Likely secondary to chronic disease.  Iron studies B12 and folate are normal . Visit Diagnosis 1. Intrahepatic cholangiocarcinoma ([place])   2. Abnormal LFTs   3. Encounter for antineoplastic chemotherapy   4. Neutrophilia      Dr. Randa Evens, MD, MPH [facility] at [facility] [phone] 07/07/2021 10:13 AM

## 2021-07-07 NOTE — Patient Instructions (Signed)
[facility]  Discharge Instructions: Thank you for choosing [facility] to provide your oncology and hematology care.  If you have a lab appointment with the [facility], please go directly to the [facility] and check in at the registration area.  Wear comfortable clothing and clothing appropriate for easy access to any Portacath or PICC line.   We strive to give you quality time with your provider. You may need to reschedule your appointment if you arrive late (15 or more minutes).  Arriving late affects you and other patients whose appointments are after yours.  Also, if you miss three or more appointments without notifying the office, you may be dismissed from the clinic at the provider's discretion.      For prescription refill requests, have your pharmacy contact our office and allow 72 hours for refills to be completed.    Today you received the following chemotherapy and/or immunotherapy agents : Gemzar / Cisplatin   To help prevent nausea and vomiting after your treatment, we encourage you to take your nausea medication as directed.  BELOW ARE SYMPTOMS THAT SHOULD BE REPORTED IMMEDIATELY: *FEVER GREATER THAN 100.4 F (38 C) OR HIGHER *CHILLS OR SWEATING *NAUSEA AND VOMITING THAT IS NOT CONTROLLED WITH YOUR NAUSEA MEDICATION *UNUSUAL SHORTNESS OF BREATH *UNUSUAL BRUISING OR BLEEDING *URINARY PROBLEMS (pain or burning when urinating, or frequent urination) *BOWEL PROBLEMS (unusual diarrhea, constipation, pain near the anus) TENDERNESS IN MOUTH AND THROAT WITH OR WITHOUT PRESENCE OF ULCERS (sore throat, sores in mouth, or a toothache) UNUSUAL RASH, SWELLING OR PAIN  UNUSUAL VAGINAL DISCHARGE OR ITCHING   Items with * indicate a potential emergency and should be followed up as soon as possible or go to the Emergency Department if any problems should occur.  Please show the CHEMOTHERAPY ALERT CARD or IMMUNOTHERAPY ALERT CARD at  check-in to the Emergency Department and triage nurse.  Should you have questions after your visit or need to cancel or reschedule your appointment, please contact [facility]  [phone] and follow the prompts.  Office hours are 8:00 a.m. to 4:30 p.m. Monday - Friday. Please note that voicemails left after 4:00 p.m. may not be returned until the following business day.  We are closed weekends and major holidays. You have access to a nurse at all times for urgent questions. Please call the main number to the clinic [phone] and follow the prompts.  For any non-urgent questions, you may also contact your provider using MyChart. We now offer e-Visits for anyone 88 and older to request care online for non-urgent symptoms. For details visit [email or web address].   Also download the MyChart app! Go to the app store, search "MyChart", open the app, select [facility], and log in with your MyChart username and password.  Due to Covid, a mask is required upon entering the hospital/clinic. If you do not have a mask, one will be given to you upon arrival. For doctor visits, patients may have 1 support person aged 29 or older with them. For treatment visits, patients cannot have anyone with them due to current Covid guidelines and our immunocompromised population.

## 2021-07-09 ENCOUNTER — Encounter (INDEPENDENT_AMBULATORY_CARE_PROVIDER_SITE_OTHER): Payer: Self-pay

## 2021-07-14 ENCOUNTER — Inpatient Hospital Stay: Payer: Medicare HMO

## 2021-07-14 VITALS — BP 127/62 | HR 81 | Temp 96.2°F | Resp 18

## 2021-07-14 DIAGNOSIS — R7989 Other specified abnormal findings of blood chemistry: Secondary | ICD-10-CM | POA: Diagnosis not present

## 2021-07-14 DIAGNOSIS — D649 Anemia, unspecified: Secondary | ICD-10-CM | POA: Diagnosis not present

## 2021-07-14 DIAGNOSIS — C221 Intrahepatic bile duct carcinoma: Secondary | ICD-10-CM

## 2021-07-14 DIAGNOSIS — Z5111 Encounter for antineoplastic chemotherapy: Secondary | ICD-10-CM | POA: Diagnosis not present

## 2021-07-14 DIAGNOSIS — R69 Illness, unspecified: Secondary | ICD-10-CM | POA: Diagnosis not present

## 2021-07-14 DIAGNOSIS — I10 Essential (primary) hypertension: Secondary | ICD-10-CM | POA: Diagnosis not present

## 2021-07-14 DIAGNOSIS — E785 Hyperlipidemia, unspecified: Secondary | ICD-10-CM | POA: Diagnosis not present

## 2021-07-14 DIAGNOSIS — Z7982 Long term (current) use of aspirin: Secondary | ICD-10-CM | POA: Diagnosis not present

## 2021-07-14 DIAGNOSIS — Z79899 Other long term (current) drug therapy: Secondary | ICD-10-CM | POA: Diagnosis not present

## 2021-07-14 DIAGNOSIS — E119 Type 2 diabetes mellitus without complications: Secondary | ICD-10-CM | POA: Diagnosis not present

## 2021-07-14 LAB — CBC WITH DIFFERENTIAL/PLATELET
Abs Immature Granulocytes: 0.18 10*3/uL — ABNORMAL HIGH (ref 0.00–0.07)
Basophils Absolute: 0.1 10*3/uL (ref 0.0–0.1)
Basophils Relative: 1 %
Eosinophils Absolute: 0.1 10*3/uL (ref 0.0–0.5)
Eosinophils Relative: 1 %
HCT: 26.1 % — ABNORMAL LOW (ref 36.0–46.0)
Hemoglobin: 8.3 g/dL — ABNORMAL LOW (ref 12.0–15.0)
Immature Granulocytes: 1 %
Lymphocytes Relative: 19 %
Lymphs Abs: 2.8 10*3/uL (ref 0.7–4.0)
MCH: 32.5 pg (ref 26.0–34.0)
MCHC: 31.8 g/dL (ref 30.0–36.0)
MCV: 102.4 fL — ABNORMAL HIGH (ref 80.0–100.0)
Monocytes Absolute: 1.4 10*3/uL — ABNORMAL HIGH (ref 0.1–1.0)
Monocytes Relative: 10 %
Neutro Abs: 10 10*3/uL — ABNORMAL HIGH (ref 1.7–7.7)
Neutrophils Relative %: 68 %
Platelets: 338 10*3/uL (ref 150–400)
RBC: 2.55 MIL/uL — ABNORMAL LOW (ref 3.87–5.11)
RDW: 18.2 % — ABNORMAL HIGH (ref 11.5–15.5)
WBC: 14.6 10*3/uL — ABNORMAL HIGH (ref 4.0–10.5)
nRBC: 0 % (ref 0.0–0.2)

## 2021-07-14 LAB — COMPREHENSIVE METABOLIC PANEL
ALT: 37 U/L (ref 0–44)
AST: 53 U/L — ABNORMAL HIGH (ref 15–41)
Albumin: 2.7 g/dL — ABNORMAL LOW (ref 3.5–5.0)
Alkaline Phosphatase: 678 U/L — ABNORMAL HIGH (ref 38–126)
Anion gap: 8 (ref 5–15)
BUN: 19 mg/dL (ref 8–23)
CO2: 24 mmol/L (ref 22–32)
Calcium: 8.4 mg/dL — ABNORMAL LOW (ref 8.9–10.3)
Chloride: 99 mmol/L (ref 98–111)
Creatinine, Ser: 1.04 mg/dL — ABNORMAL HIGH (ref 0.44–1.00)
GFR, Estimated: >60 mL/min (ref >60)
Glucose, Bld: 228 mg/dL — ABNORMAL HIGH (ref 70–99)
Potassium: 3.9 mmol/L (ref 3.5–5.1)
Sodium: 131 mmol/L — ABNORMAL LOW (ref 135–145)
Total Bilirubin: 2 mg/dL — ABNORMAL HIGH (ref 0.3–1.2)
Total Protein: 7.9 g/dL (ref 6.5–8.1)

## 2021-07-14 LAB — MAGNESIUM: Magnesium: 2.4 mg/dL (ref 1.7–2.4)

## 2021-07-14 MED ORDER — POTASSIUM CHLORIDE IN NACL 20-0.9 MEQ/L-% IV SOLN
INTRAVENOUS | Status: DC
  Filled 2021-07-14 (×2): qty 1000

## 2021-07-14 MED ORDER — PALONOSETRON HCL INJECTION 0.25 MG/5ML
0.2500 mg | Freq: Once | INTRAVENOUS | Status: AC
  Administered 2021-07-14: 0.25 mg via INTRAVENOUS
  Filled 2021-07-14: qty 5

## 2021-07-14 MED ORDER — SODIUM CHLORIDE 0.9 % IV SOLN
Freq: Once | INTRAVENOUS | Status: AC
  Filled 2021-07-14: qty 250

## 2021-07-14 MED ORDER — SODIUM CHLORIDE 0.9 % IV SOLN: Freq: Once | INTRAVENOUS | Status: DC

## 2021-07-14 MED ORDER — MAGNESIUM SULFATE 2 GM/50ML IV SOLN
2.0000 g | Freq: Once | INTRAVENOUS | Status: AC
  Administered 2021-07-14: 2 g via INTRAVENOUS
  Filled 2021-07-14: qty 50

## 2021-07-14 MED ORDER — PEGFILGRASTIM 6 MG/0.6ML ~~LOC~~ PSKT
6.0000 mg | PREFILLED_SYRINGE | Freq: Once | SUBCUTANEOUS | Status: AC
  Administered 2021-07-14: 6 mg via SUBCUTANEOUS
  Filled 2021-07-14: qty 0.6

## 2021-07-14 MED ORDER — SODIUM CHLORIDE 0.9 % IV SOLN
35.0000 mg/m2 | Freq: Once | INTRAVENOUS | Status: AC
  Administered 2021-07-14: 63 mg via INTRAVENOUS
  Filled 2021-07-14: qty 63

## 2021-07-14 MED ORDER — SODIUM CHLORIDE 0.9 % IV SOLN
10.0000 mg | Freq: Once | INTRAVENOUS | Status: AC
  Administered 2021-07-14: 10 mg via INTRAVENOUS
  Filled 2021-07-14: qty 10

## 2021-07-14 MED ORDER — SODIUM CHLORIDE 0.9 % IV SOLN
1400.0000 mg | Freq: Once | INTRAVENOUS | Status: AC
  Administered 2021-07-14: 1400 mg via INTRAVENOUS
  Filled 2021-07-14: qty 26.3

## 2021-07-14 MED ORDER — SODIUM CHLORIDE 0.9 % IV SOLN
150.0000 mg | Freq: Once | INTRAVENOUS | Status: AC
  Administered 2021-07-14: 150 mg via INTRAVENOUS
  Filled 2021-07-14: qty 150

## 2021-07-14 NOTE — Patient Instructions (Signed)
[facility]  Discharge Instructions: Thank you for choosing [facility] to provide your oncology and hematology care.  If you have a lab appointment with the [facility], please go directly to the [facility] and check in at the registration area.  Wear comfortable clothing and clothing appropriate for easy access to any Portacath or PICC line.   We strive to give you quality time with your provider. You may need to reschedule your appointment if you arrive late (15 or more minutes).  Arriving late affects you and other patients whose appointments are after yours.  Also, if you miss three or more appointments without notifying the office, you may be dismissed from the clinic at the provider's discretion.      For prescription refill requests, have your pharmacy contact our office and allow 72 hours for refills to be completed.    Today you received the following chemotherapy and/or immunotherapy agents GEMZAR and CISPLATIN      To help prevent nausea and vomiting after your treatment, we encourage you to take your nausea medication as directed.  BELOW ARE SYMPTOMS THAT SHOULD BE REPORTED IMMEDIATELY: *FEVER GREATER THAN 100.4 F (38 C) OR HIGHER *CHILLS OR SWEATING *NAUSEA AND VOMITING THAT IS NOT CONTROLLED WITH YOUR NAUSEA MEDICATION *UNUSUAL SHORTNESS OF BREATH *UNUSUAL BRUISING OR BLEEDING *URINARY PROBLEMS (pain or burning when urinating, or frequent urination) *BOWEL PROBLEMS (unusual diarrhea, constipation, pain near the anus) TENDERNESS IN MOUTH AND THROAT WITH OR WITHOUT PRESENCE OF ULCERS (sore throat, sores in mouth, or a toothache) UNUSUAL RASH, SWELLING OR PAIN  UNUSUAL VAGINAL DISCHARGE OR ITCHING   Items with * indicate a potential emergency and should be followed up as soon as possible or go to the Emergency Department if any problems should occur.  Please show the CHEMOTHERAPY ALERT CARD or IMMUNOTHERAPY ALERT CARD at  check-in to the Emergency Department and triage nurse.  Should you have questions after your visit or need to cancel or reschedule your appointment, please contact [facility]  [phone] and follow the prompts.  Office hours are 8:00 a.m. to 4:30 p.m. Monday - Friday. Please note that voicemails left after 4:00 p.m. may not be returned until the following business day.  We are closed weekends and major holidays. You have access to a nurse at all times for urgent questions. Please call the main number to the clinic [phone] and follow the prompts.  For any non-urgent questions, you may also contact your provider using MyChart. We now offer e-Visits for anyone 43 and older to request care online for non-urgent symptoms. For details visit [email or web address].   Also download the MyChart app! Go to the app store, search "MyChart", open the app, select [facility], and log in with your MyChart username and password.  Due to Covid, a mask is required upon entering the hospital/clinic. If you do not have a mask, one will be given to you upon arrival. For doctor visits, patients may have 1 support person aged 64 or older with them. For treatment visits, patients cannot have anyone with them due to current Covid guidelines and our immunocompromised population.   Gemcitabine injection What is this medication? GEMCITABINE (jem SYE ta been) is a chemotherapy drug. This medicine is used to treat many types of cancer like breast cancer, lung cancer, pancreatic cancer,and ovarian cancer. This medicine may be used for other purposes; ask your health care provider orpharmacist if you have questions. COMMON  BRAND NAME(S): Gemzar, Infugem What should I tell my care team before I take this medication? They need to know if you have any of these conditions: blood disorders infection kidney disease liver disease lung or breathing disease, like asthma recent or ongoing  radiation therapy an unusual or allergic reaction to gemcitabine, other chemotherapy, other medicines, foods, dyes, or preservatives pregnant or trying to get pregnant breast-feeding How should I use this medication? This drug is given as an infusion into a vein. It is administered in a hospitalor clinic by a specially trained health care professional. Talk to your pediatrician regarding the use of this medicine in children.Special care may be needed. Overdosage: If you think you have taken too much of this medicine contact apoison control center or emergency room at once. NOTE: This medicine is only for you. Do not share this medicine with others. What if I miss a dose? It is important not to miss your dose. Call your doctor or health careprofessional if you are unable to keep an appointment. What may interact with this medication? medicines to increase blood counts like filgrastim, pegfilgrastim, sargramostim some other chemotherapy drugs like cisplatin vaccines Talk to your doctor or health care professional before taking any of thesemedicines: acetaminophen aspirin ibuprofen ketoprofen naproxen This list may not describe all possible interactions. Give your health care provider a list of all the medicines, herbs, non-prescription drugs, or dietary supplements you use. Also tell them if you smoke, drink alcohol, or use illegaldrugs. Some items may interact with your medicine. What should I watch for while using this medication? Visit your doctor for checks on your progress. This drug may make you feel generally unwell. This is not uncommon, as chemotherapy can affect healthy cells as well as cancer cells. Report any side effects. Continue your course oftreatment even though you feel ill unless your doctor tells you to stop. In some cases, you may be given additional medicines to help with side effects.Follow all directions for their use. Call your doctor or health care professional for  advice if you get a fever, chills or sore throat, or other symptoms of a cold or flu. Do not treat yourself. This drug decreases your body's ability to fight infections. Try toavoid being around people who are sick. This medicine may increase your risk to bruise or bleed. Call your doctor orhealth care professional if you notice any unusual bleeding. Be careful brushing and flossing your teeth or using a toothpick because you may get an infection or bleed more easily. If you have any dental work done,tell your dentist you are receiving this medicine. Avoid taking products that contain aspirin, acetaminophen, ibuprofen, naproxen, or ketoprofen unless instructed by your doctor. These medicines may hide afever. Do not become pregnant while taking this medicine or for 6 months after stopping it. Women should inform their doctor if they wish to become pregnant or think they might be pregnant. Men should not father a child while taking this medicine and for 3 months after stopping it. There is a potential for serious side effects to an unborn child. Talk to your health care professional or pharmacist for more information. Do not breast-feed an infant while takingthis medicine or for at least 1 week after stopping it. Men should inform their doctors if they wish to father a child. This medicine may lower sperm counts. Talk with your doctor or health care professional ifyou are concerned about your fertility. What side effects may I notice from receiving this medication? Side effects  that you should report to your doctor or health care professionalas soon as possible: allergic reactions like skin rash, itching or hives, swelling of the face, lips, or tongue breathing problems pain, redness, or irritation at site where injected signs and symptoms of a dangerous change in heartbeat or heart rhythm like chest pain; dizziness; fast or irregular heartbeat; palpitations; feeling faint or lightheaded, falls; breathing  problems signs of decreased platelets or bleeding - bruising, pinpoint red spots on the skin, black, tarry stools, blood in the urine signs of decreased red blood cells - unusually weak or tired, feeling faint or lightheaded, falls signs of infection - fever or chills, cough, sore throat, pain or difficulty passing urine signs and symptoms of kidney injury like trouble passing urine or change in the amount of urine signs and symptoms of liver injury like dark yellow or brown urine; general ill feeling or flu-like symptoms; light-colored stools; loss of appetite; nausea; right upper belly pain; unusually weak or tired; yellowing of the eyes or skin swelling of ankles, feet, hands Side effects that usually do not require medical attention (report to yourdoctor or health care professional if they continue or are bothersome): constipation diarrhea hair loss loss of appetite nausea rash vomiting This list may not describe all possible side effects. Call your doctor for medical advice about side effects. You may report side effects to FDA at1-800-FDA-1088. Where should I keep my medication? This drug is given in a hospital or clinic and will not be stored at home. NOTE: This sheet is a summary. It may not cover all possible information. If you have questions about this medicine, talk to your doctor, pharmacist, orhealth care provider.  2022 Elsevier/Gold Standard (2018-02-16 18:06:11)  Cisplatin injection What is this medication? CISPLATIN (SIS pla tin) is a chemotherapy drug. It targets fast dividing cells, like cancer cells, and causes these cells to die. This medicine is used totreat many types of cancer like bladder, ovarian, and testicular cancers. This medicine may be used for other purposes; ask your health care provider orpharmacist if you have questions. COMMON BRAND NAME(S): Platinol, Platinol -AQ What should I tell my care team before I take this medication? They need to know if you  have any of these conditions: eye disease, vision problems hearing problems kidney disease low blood counts, like white cells, platelets, or red blood cells tingling of the fingers or toes, or other nerve disorder an unusual or allergic reaction to cisplatin, carboplatin, oxaliplatin, other medicines, foods, dyes, or preservatives pregnant or trying to get pregnant breast-feeding How should I use this medication? This drug is given as an infusion into a vein. It is administered in a hospitalor clinic by a specially trained health care professional. Talk to your pediatrician regarding the use of this medicine in children.Special care may be needed. Overdosage: If you think you have taken too much of this medicine contact apoison control center or emergency room at once. NOTE: This medicine is only for you. Do not share this medicine with others. What if I miss a dose? It is important not to miss a dose. Call your doctor or health careprofessional if you are unable to keep an appointment. What may interact with this medication? This medicine may interact with the following medications: foscarnet certain antibiotics like amikacin, gentamicin, neomycin, polymyxin B, streptomycin, tobramycin, vancomycin This list may not describe all possible interactions. Give your health care provider a list of all the medicines, herbs, non-prescription drugs, or dietary supplements you use. Also  tell them if you smoke, drink alcohol, or use illegaldrugs. Some items may interact with your medicine. What should I watch for while using this medication? Your condition will be monitored carefully while you are receiving this medicine. You will need important blood work done while you are taking thismedicine. This drug may make you feel generally unwell. This is not uncommon, as chemotherapy can affect healthy cells as well as cancer cells. Report any side effects. Continue your course of treatment even though you  feel ill unless yourdoctor tells you to stop. This medicine may increase your risk of getting an infection. Call your healthcare professional for advice if you get a fever, chills, or sore throat, or other symptoms of a cold or flu. Do not treat yourself. Try to avoid beingaround people who are sick. Avoid taking medicines that contain aspirin, acetaminophen, ibuprofen, naproxen, or ketoprofen unless instructed by your healthcare professional.These medicines may hide a fever. This medicine may increase your risk to bruise or bleed. Call your doctor orhealth care professional if you notice any unusual bleeding. Be careful brushing and flossing your teeth or using a toothpick because you may get an infection or bleed more easily. If you have any dental work done,tell your dentist you are receiving this medicine. Do not become pregnant while taking this medicine or for 14 months after stopping it. Women should inform their healthcare professional if they wish to become pregnant or think they might be pregnant. Men should not father a child while taking this medicine and for 11 months after stopping it. There is potential for serious side effects to an unborn child. Talk to your healthcareprofessional for more information. Do not breast-feed an infant while taking this medicine. This medicine has caused ovarian failure in some women. This medicine may make it more difficult to get pregnant. Talk to your healthcare professional if [place] concerned about your fertility. This medicine has caused decreased sperm counts in some men. This may make it more difficult to father a child. Talk to your healthcare professional if [place] concerned about your fertility. Drink fluids as directed while you are taking this medicine. This will helpprotect your kidneys. Call your doctor or health care professional if you get diarrhea. Do not treatyourself. What side effects may I notice from receiving this medication? Side  effects that you should report to your doctor or health care professionalas soon as possible: allergic reactions like skin rash, itching or hives, swelling of the face, lips, or tongue blurred vision changes in vision decreased hearing or ringing of the ears nausea, vomiting pain, redness, or irritation at site where injected pain, tingling, numbness in the hands or feet signs and symptoms of bleeding such as bloody or black, tarry stools; red or dark brown urine; spitting up blood or brown material that looks like coffee grounds; red spots on the skin; unusual bruising or bleeding from the eyes, gums, or nose signs and symptoms of infection like fever; chills; cough; sore throat; pain or trouble passing urine signs and symptoms of kidney injury like trouble passing urine or change in the amount of urine signs and symptoms of low red blood cells or anemia such as unusually weak or tired; feeling faint or lightheaded; falls; breathing problems Side effects that usually do not require medical attention (report to yourdoctor or health care professional if they continue or are bothersome): loss of appetite mouth sores muscle cramps This list may not describe all possible side effects. Call your doctor for medical advice  about side effects. You may report side effects to FDA at1-800-FDA-1088. Where should I keep my medication? This drug is given in a hospital or clinic and will not be stored at home. NOTE: This sheet is a summary. It may not cover all possible information. If you have questions about this medicine, talk to your doctor, pharmacist, orhealth care provider.  2022 Elsevier/Gold Standard (2018-11-18 15:59:17)

## 2021-07-14 NOTE — Progress Notes (Signed)
When starting GEMZAR pt c/o buringin at site. Site feels good and has good blood return. Site also assessed by [facility] S RN. ;rate slowed to half, pt states felt better.  Rechecked iv site after gemzar and prior to starting cisplatin. Good blood return noted, site appears with out issues, and pt states it feels fine.

## 2021-07-16 NOTE — Progress Notes (Signed)
Patient was called with an appointment time for her procedure for her port placement on 8/11.  Patient was told to be at registration at 1045 for her procedure start time at 1145.  Patient verbalizes her understanding.

## 2021-07-17 ENCOUNTER — Other Ambulatory Visit: Payer: Self-pay

## 2021-07-17 ENCOUNTER — Other Ambulatory Visit (INDEPENDENT_AMBULATORY_CARE_PROVIDER_SITE_OTHER): Payer: Self-pay | Admitting: Nurse Practitioner

## 2021-07-17 ENCOUNTER — Ambulatory Visit
Admission: RE | Admit: 2021-07-17 | Discharge: 2021-07-17 | Disposition: A | Discharge status: Home / Self Care | Payer: Medicare HMO | Attending: Vascular Surgery | Admitting: Vascular Surgery

## 2021-07-17 ENCOUNTER — Encounter
Admission: RE | Disposition: A | Discharge status: Home / Self Care | Payer: Self-pay | Source: Home / Self Care | Attending: Vascular Surgery

## 2021-07-17 DIAGNOSIS — E119 Type 2 diabetes mellitus without complications: Secondary | ICD-10-CM | POA: Diagnosis not present

## 2021-07-17 DIAGNOSIS — R69 Illness, unspecified: Secondary | ICD-10-CM | POA: Diagnosis not present

## 2021-07-17 DIAGNOSIS — I10 Essential (primary) hypertension: Secondary | ICD-10-CM | POA: Diagnosis not present

## 2021-07-17 DIAGNOSIS — Z9221 Personal history of antineoplastic chemotherapy: Secondary | ICD-10-CM | POA: Insufficient documentation

## 2021-07-17 DIAGNOSIS — F17210 Nicotine dependence, cigarettes, uncomplicated: Secondary | ICD-10-CM | POA: Insufficient documentation

## 2021-07-17 DIAGNOSIS — D649 Anemia, unspecified: Secondary | ICD-10-CM | POA: Insufficient documentation

## 2021-07-17 DIAGNOSIS — C221 Intrahepatic bile duct carcinoma: Secondary | ICD-10-CM | POA: Diagnosis not present

## 2021-07-17 DIAGNOSIS — Z7982 Long term (current) use of aspirin: Secondary | ICD-10-CM | POA: Diagnosis not present

## 2021-07-17 DIAGNOSIS — Z452 Encounter for adjustment and management of vascular access device: Secondary | ICD-10-CM

## 2021-07-17 DIAGNOSIS — Z79899 Other long term (current) drug therapy: Secondary | ICD-10-CM | POA: Diagnosis not present

## 2021-07-17 DIAGNOSIS — E785 Hyperlipidemia, unspecified: Secondary | ICD-10-CM | POA: Diagnosis not present

## 2021-07-17 DIAGNOSIS — Z794 Long term (current) use of insulin: Secondary | ICD-10-CM | POA: Insufficient documentation

## 2021-07-17 HISTORY — PX: PORTA CATH INSERTION: CATH118285

## 2021-07-17 LAB — GLUCOSE, CAPILLARY: Glucose-Capillary: 98 mg/dL (ref 70–99)

## 2021-07-17 SURGERY — PORTA CATH INSERTION
Anesthesia: Moderate Sedation

## 2021-07-17 MED ORDER — CEFAZOLIN SODIUM-DEXTROSE 2-4 GM/100ML-% IV SOLN
2.0000 g | Freq: Once | INTRAVENOUS | Status: AC
  Administered 2021-07-17: 2 g via INTRAVENOUS

## 2021-07-17 MED ORDER — DIPHENHYDRAMINE HCL 50 MG/ML IJ SOLN: 50.0000 mg | Freq: Once | INTRAMUSCULAR | Status: DC | PRN

## 2021-07-17 MED ORDER — METHYLPREDNISOLONE SODIUM SUCC 125 MG IJ SOLR: 125.0000 mg | Freq: Once | INTRAMUSCULAR | Status: DC | PRN

## 2021-07-17 MED ORDER — FENTANYL CITRATE (PF) 100 MCG/2ML IJ SOLN
INTRAMUSCULAR | Status: DC | PRN
  Administered 2021-07-17 (×2): 25 ug via INTRAVENOUS
  Administered 2021-07-17: 50 ug via INTRAVENOUS

## 2021-07-17 MED ORDER — CHLORHEXIDINE GLUCONATE CLOTH 2 % EX PADS
6.0000 | MEDICATED_PAD | Freq: Every day | CUTANEOUS | Status: DC
Start: 2021-07-17 — End: 2021-07-19

## 2021-07-17 MED ORDER — FAMOTIDINE 20 MG PO TABS: 40.0000 mg | ORAL_TABLET | Freq: Once | ORAL | Status: DC | PRN

## 2021-07-17 MED ORDER — FENTANYL CITRATE (PF) 100 MCG/2ML IJ SOLN
INTRAMUSCULAR | Status: AC
  Filled 2021-07-17: qty 2

## 2021-07-17 MED ORDER — SODIUM CHLORIDE 0.9 % IV SOLN: INTRAVENOUS | Status: DC

## 2021-07-17 MED ORDER — SODIUM CHLORIDE 0.9 % IV SOLN
Freq: Once | INTRAVENOUS | Status: DC
  Filled 2021-07-17: qty 2

## 2021-07-17 MED ORDER — MIDAZOLAM HCL 2 MG/ML PO SYRP: 8.0000 mg | ORAL_SOLUTION | Freq: Once | ORAL | Status: DC | PRN

## 2021-07-17 MED ORDER — MIDAZOLAM HCL 2 MG/2ML IJ SOLN
INTRAMUSCULAR | Status: DC | PRN
  Administered 2021-07-17: 2 mg via INTRAVENOUS

## 2021-07-17 MED ORDER — HYDROMORPHONE HCL 1 MG/ML IJ SOLN: 1.0000 mg | Freq: Once | INTRAMUSCULAR | Status: DC | PRN

## 2021-07-17 MED ORDER — MIDAZOLAM HCL 2 MG/2ML IJ SOLN
INTRAMUSCULAR | Status: AC
  Filled 2021-07-17: qty 2

## 2021-07-17 MED ORDER — ONDANSETRON HCL 4 MG/2ML IJ SOLN: 4.0000 mg | Freq: Four times a day (QID) | INTRAMUSCULAR | Status: DC | PRN

## 2021-07-17 SURGICAL SUPPLY — 13 items
COVER PROBE U/S 5X48 (MISCELLANEOUS) ×2 IMPLANT
COVER SURGICAL LIGHT HANDLE (MISCELLANEOUS) ×2 IMPLANT
DERMABOND ADVANCED (GAUZE/BANDAGES/DRESSINGS) ×1
DERMABOND ADVANCED .7 DNX12 (GAUZE/BANDAGES/DRESSINGS) ×1 IMPLANT
HANDLE YANKAUER SUCT BULB TIP (MISCELLANEOUS) ×2 IMPLANT
KIT PORT POWER 8FR ISP CVUE (Port) ×2 IMPLANT
PACK ANGIOGRAPHY (CUSTOM PROCEDURE TRAY) ×2 IMPLANT
PENCIL ELECTRO HAND CTR (MISCELLANEOUS) ×2 IMPLANT
SUT MNCRL AB 4-0 PS2 18 (SUTURE) ×2 IMPLANT
SUT VIC AB 3-0 SH 27 (SUTURE) ×2
SUT VIC AB 3-0 SH 27X BRD (SUTURE) ×1 IMPLANT
TOWEL OR 17X26 4PK STRL BLUE (TOWEL DISPOSABLE) ×2 IMPLANT
TUBING CONNECTING 10 (TUBING) ×4 IMPLANT

## 2021-07-17 NOTE — Interval H&P Note (Signed)
History and Physical Interval Note:  07/17/2021 12:02 PM  Janice Short  has presented today for surgery, with the diagnosis of Porta Cath Insertion   Intrahepatic bile duct Ca.  The various methods of treatment have been discussed with the patient and family. After consideration of risks, benefits and other options for treatment, the patient has consented to  Procedure(s): PORTA CATH INSERTION (N/A) as a surgical intervention.  The patient's history has been reviewed, patient examined, no change in status, stable for surgery.  I have reviewed the patient's chart and labs.  Questions were answered to the patient's satisfaction.     Leotis Pain

## 2021-07-17 NOTE — Op Note (Signed)
      [facility]       Operative Note  Date: 07/17/2021  Preoperative diagnosis:  1. Bile duct carcinoma  Postoperative diagnosis:  Same as above  Procedures: #1. Ultrasound guidance for vascular access to the right internal jugular vein. #2. Fluoroscopic guidance for placement of catheter. #3. Placement of CT compatible Port-A-Cath, right internal jugular vein.  Surgeon: Leotis Pain, MD.   Anesthesia: Local with moderate conscious sedation for approximately 29  minutes using 2 mg of Versed and 100 mcg of Fentanyl  Fluoroscopy time: less than 1 minute  Contrast used: 0  Estimated blood loss: 3 cc  Indication for the procedure:  The patient is a 60 y.o.female with bile duct carcinoma.  The patient needs a Port-A-Cath for durable venous access, chemotherapy, lab draws, and CT scans. We are asked to place this. Risks and benefits were discussed and informed consent was obtained.  Description of procedure: The patient was brought to the vascular and interventional radiology suite.  Moderate conscious sedation was administered throughout the procedure during a face to face encounter with the patient with my supervision of the RN administering medicines and monitoring the patient's vital signs, pulse oximetry, telemetry and mental status throughout from the start of the procedure until the patient was taken to the recovery room. The right neck chest and shoulder were sterilely prepped and draped, and a sterile surgical field was created. Ultrasound was used to help visualize a patent right internal jugular vein. This was then accessed under direct ultrasound guidance without difficulty with the Seldinger needle and a permanent image was recorded. A J-wire was placed. After skin nick and dilatation, the peel-away sheath was then placed over the wire. I then anesthetized an area under the clavicle approximately 1-2 fingerbreadths. A transverse incision was created and an  inferior pocket was created with electrocautery and blunt dissection. The port was then brought onto the field, placed into the pocket and secured to the chest wall with 2 Prolene sutures. The catheter was connected to the port and tunneled from the subclavicular incision to the access site. Fluoroscopic guidance was then used to cut the catheter to an appropriate length. The catheter was then placed through the peel-away sheath and the peel-away sheath was removed. The catheter tip was parked in excellent location under fluorocoscopic guidance in the cavoatrial junction. The pocket was then irrigated with antibiotic impregnated saline and the wound was closed with a running 3-0 Vicryl and a 4-0 Monocryl. The access incision was closed with a single 4-0 Monocryl. The Huber needle was used to withdraw blood and flush the port with heparinized saline. Dermabond was then placed as a dressing. The patient tolerated the procedure well and was taken to the recovery room in stable condition.   Leotis Pain 07/17/2021 12:53 PM   This note was created with Dragon Medical transcription system. Any errors in dictation are purely unintentional.

## 2021-07-18 ENCOUNTER — Encounter: Payer: Self-pay | Admitting: Vascular Surgery

## 2021-07-22 ENCOUNTER — Telehealth: Payer: Self-pay | Admitting: *Deleted

## 2021-07-22 NOTE — Telephone Encounter (Signed)
  Care Management   Follow Up Note   07/22/2021 Name: ZALEA PETE MRN: [id number] DOB: 30-Jun-1961   Referred by: Steele Sizer, MD Reason for referral : Care Coordination   An unsuccessful telephone outreach was attempted today. The patient was referred to the case management team for assistance with care management and care coordination.   Follow Up Plan: Telephone follow up appointment with care management team member scheduled for: 07/25/21  Elliot Gurney, [place] Worker  [facility] Care Management [phone]

## 2021-07-25 ENCOUNTER — Telehealth: Payer: Medicare HMO | Admitting: *Deleted

## 2021-07-25 ENCOUNTER — Telehealth: Payer: Self-pay | Admitting: *Deleted

## 2021-07-25 NOTE — Telephone Encounter (Signed)
  Care Management   Follow Up Note   07/25/2021 Name: Janice Short MRN: [id number] DOB: [birth year]   Referred by: Steele Sizer, MD Reason for referral : No chief complaint on file.   A second unsuccessful telephone outreach was attempted today. The patient was referred to the case management team for assistance with care management and care coordination.   Follow Up Plan: Telephone follow up appointment with care management team member scheduled for: 08/01/21  Elliot Gurney, [place] Worker  [facility] Care Management [phone]

## 2021-07-28 ENCOUNTER — Inpatient Hospital Stay (HOSPITAL_BASED_OUTPATIENT_CLINIC_OR_DEPARTMENT_OTHER): Payer: Medicare HMO | Admitting: Oncology

## 2021-07-28 ENCOUNTER — Inpatient Hospital Stay: Payer: Medicare HMO

## 2021-07-28 ENCOUNTER — Other Ambulatory Visit: Payer: Self-pay

## 2021-07-28 ENCOUNTER — Encounter: Payer: Self-pay | Admitting: Oncology

## 2021-07-28 VITALS — BP 124/73 | HR 88 | Temp 96.7°F | Resp 18 | Wt 150.0 lb

## 2021-07-28 DIAGNOSIS — R945 Abnormal results of liver function studies: Secondary | ICD-10-CM | POA: Diagnosis not present

## 2021-07-28 DIAGNOSIS — Z95828 Presence of other vascular implants and grafts: Secondary | ICD-10-CM

## 2021-07-28 DIAGNOSIS — Z79899 Other long term (current) drug therapy: Secondary | ICD-10-CM | POA: Diagnosis not present

## 2021-07-28 DIAGNOSIS — Z5111 Encounter for antineoplastic chemotherapy: Secondary | ICD-10-CM | POA: Diagnosis not present

## 2021-07-28 DIAGNOSIS — R69 Illness, unspecified: Secondary | ICD-10-CM | POA: Diagnosis not present

## 2021-07-28 DIAGNOSIS — R7989 Other specified abnormal findings of blood chemistry: Secondary | ICD-10-CM

## 2021-07-28 DIAGNOSIS — D649 Anemia, unspecified: Secondary | ICD-10-CM | POA: Diagnosis not present

## 2021-07-28 DIAGNOSIS — E785 Hyperlipidemia, unspecified: Secondary | ICD-10-CM | POA: Diagnosis not present

## 2021-07-28 DIAGNOSIS — C221 Intrahepatic bile duct carcinoma: Secondary | ICD-10-CM | POA: Diagnosis not present

## 2021-07-28 DIAGNOSIS — E119 Type 2 diabetes mellitus without complications: Secondary | ICD-10-CM | POA: Diagnosis not present

## 2021-07-28 DIAGNOSIS — Z7982 Long term (current) use of aspirin: Secondary | ICD-10-CM | POA: Diagnosis not present

## 2021-07-28 DIAGNOSIS — I10 Essential (primary) hypertension: Secondary | ICD-10-CM | POA: Diagnosis not present

## 2021-07-28 LAB — CBC WITH DIFFERENTIAL/PLATELET
Abs Immature Granulocytes: 4.58 10*3/uL — ABNORMAL HIGH (ref 0.00–0.07)
Basophils Absolute: 0.1 10*3/uL (ref 0.0–0.1)
Basophils Relative: 0 %
Eosinophils Absolute: 1 10*3/uL — ABNORMAL HIGH (ref 0.0–0.5)
Eosinophils Relative: 2 %
HCT: 24.6 % — ABNORMAL LOW (ref 36.0–46.0)
Hemoglobin: 7.7 g/dL — ABNORMAL LOW (ref 12.0–15.0)
Immature Granulocytes: 9 %
Lymphocytes Relative: 7 %
Lymphs Abs: 3.4 10*3/uL (ref 0.7–4.0)
MCH: 34.5 pg — ABNORMAL HIGH (ref 26.0–34.0)
MCHC: 31.3 g/dL (ref 30.0–36.0)
MCV: 110.3 fL — ABNORMAL HIGH (ref 80.0–100.0)
Monocytes Absolute: 4.2 10*3/uL — ABNORMAL HIGH (ref 0.1–1.0)
Monocytes Relative: 8 %
Neutro Abs: 37 10*3/uL — ABNORMAL HIGH (ref 1.7–7.7)
Neutrophils Relative %: 74 %
Platelets: 239 10*3/uL (ref 150–400)
RBC: 2.23 MIL/uL — ABNORMAL LOW (ref 3.87–5.11)
RDW: 23.8 % — ABNORMAL HIGH (ref 11.5–15.5)
Smear Review: NORMAL
WBC: 50.2 10*3/uL (ref 4.0–10.5)
nRBC: 1.9 % — ABNORMAL HIGH (ref 0.0–0.2)

## 2021-07-28 LAB — COMPREHENSIVE METABOLIC PANEL
ALT: 23 U/L (ref 0–44)
AST: 40 U/L (ref 15–41)
Albumin: 2.4 g/dL — ABNORMAL LOW (ref 3.5–5.0)
Alkaline Phosphatase: 751 U/L — ABNORMAL HIGH (ref 38–126)
Anion gap: 6 (ref 5–15)
BUN: 16 mg/dL (ref 8–23)
CO2: 26 mmol/L (ref 22–32)
Calcium: 8.2 mg/dL — ABNORMAL LOW (ref 8.9–10.3)
Chloride: 100 mmol/L (ref 98–111)
Creatinine, Ser: 1.18 mg/dL — ABNORMAL HIGH (ref 0.44–1.00)
GFR, Estimated: 52 mL/min — ABNORMAL LOW (ref >60)
Glucose, Bld: 209 mg/dL — ABNORMAL HIGH (ref 70–99)
Potassium: 3.7 mmol/L (ref 3.5–5.1)
Sodium: 132 mmol/L — ABNORMAL LOW (ref 135–145)
Total Bilirubin: 1.4 mg/dL — ABNORMAL HIGH (ref 0.3–1.2)
Total Protein: 7.3 g/dL (ref 6.5–8.1)

## 2021-07-28 LAB — MAGNESIUM: Magnesium: 2.2 mg/dL (ref 1.7–2.4)

## 2021-07-28 MED ORDER — HEPARIN SOD (PORK) LOCK FLUSH 100 UNIT/ML IV SOLN
500.0000 [IU] | Freq: Once | INTRAVENOUS | Status: AC
  Administered 2021-07-28: 500 [IU] via INTRAVENOUS
  Filled 2021-07-28: qty 5

## 2021-07-28 MED ORDER — SODIUM CHLORIDE 0.9% FLUSH
10.0000 mL | Freq: Once | INTRAVENOUS | Status: AC
  Administered 2021-07-28: 10 mL via INTRAVENOUS
  Filled 2021-07-28: qty 10

## 2021-07-28 MED ORDER — HEPARIN SOD (PORK) LOCK FLUSH 100 UNIT/ML IV SOLN
INTRAVENOUS | Status: AC
  Filled 2021-07-28: qty 5

## 2021-07-28 NOTE — Progress Notes (Signed)
Patient here for oncology follow-up appointment,  concerns rash around port and leg swelling

## 2021-07-28 NOTE — Progress Notes (Signed)
I connected with Burr Medico on 07/28/21 at  8:30 AM EDT by video enabled telemedicine visit and verified that I am speaking with the correct person using two identifiers.   I discussed the limitations, risks, security and privacy concerns of performing an evaluation and management service by telemedicine and the availability of in-person appointments. I also discussed with the patient that there may be a patient responsible charge related to this service. The patient expressed understanding and agreed to proceed.  Other persons participating in the visit and their role in the encounter:  none  Patient's location:  cancer center Provider's location:  home  Chief Complaint: On treatment assessment prior to cycle 4-day 1 of gemcitabine cisplatin chemotherapy  History of present illness: Patient is a 60 year old African-American female with a past medical history significant for hypertension hyperlipidemia and type 2 diabetes who presented to the ER with symptoms of cramping abdominal pain and nausea.  This was followed by right upper quadrant ultrasound which showed multiple echogenic masses in the liver with nodular contour of the liver possibly suggestive of cirrhosis.  Several nodular masses in the vicinity of the pancreatic head and porta hepatic lymph nodes.  This was followed by an MRI abdomen and MRCP.  It showed right hepatic lobe masslike area measuring 4.9 x 2.9 cm.  Diffusion abnormality in the left hepatic lobe with mild left-sided biliary ductal dilatation and another area of abnormality measuring 1.4 x 1 cm.  Bulky celiac adenopathy 3 x 2.1 cm tracking into gastrohepatic recess.  Suspected portal venous invasion with convex protrusion in the right portal vein.  Left adrenal lesion measuring 2.9 x 1.5 cm.  Findings concerning for cholangiocarcinoma or biphenotypic cholangiole hepatocellular neoplasm.     Patient underwent CT-guided liver biopsy Which was consistent with adenocarcinoma poorly  differentiated.  Immunohistochemistry showed CK7 positivity, CDX2 negative, Heppar negative, GATA3 negative.  CK19 positive.  Differentials include cholangiocarcinoma, pancreatic, upper GI.  Lung and breast less likely.  However given CK19 positivity most compatible with cholangiocarcinoma.   Patient's case was discussed at tumor board and it was felt as if the lymphadenopathy was more like a confluent primary tumor mass.  Patient was referred to [facility] for second opinion to see if she would be a candidate for surgery.However upon their review of the PET scan it was deemed that this was indeed periportal adenopathy.  They also did a CT chest abdomen and pelvis with MIPS protocol and she was also found to have portocaval lymph node measuring 2.4 cm which was more consistent with metastatic disease.  She was deemed unresectable   NGS testing showed BRCA2, FGFR2, PIK3CA, RAD 54L, CDC73, CDKN2 A/B CR EBBP rearrangement exon 31     Interval history Patient reports doing well. Has baseline fatigue. She has a rash on the undersurface of right breast which she has had for 2-3 weeks. Gradually improving. Reports some ongoing leg swelling over last 1 week. Denies any significant sob.   Review of Systems  Constitutional:  Negative for chills, fever, malaise/fatigue and weight loss.  HENT:  Negative for congestion, ear discharge and nosebleeds.   Eyes:  Negative for blurred vision.  Respiratory:  Negative for cough, hemoptysis, sputum production, shortness of breath and wheezing.   Cardiovascular:  Negative for chest pain, palpitations, orthopnea and claudication.  Gastrointestinal:  Negative for abdominal pain, blood in stool, constipation, diarrhea, heartburn, melena, nausea and vomiting.  Genitourinary:  Negative for dysuria, flank pain, frequency, hematuria and urgency.  Musculoskeletal:  Negative  for back pain, joint pain and myalgias.  Skin:  Negative for rash.  Neurological:  Negative for dizziness,  tingling, focal weakness, seizures, weakness and headaches.  Endo/Heme/Allergies:  Does not bruise/bleed easily.  Psychiatric/Behavioral:  Negative for depression and suicidal ideas. The patient does not have insomnia.    Allergies  Allergen Reactions   Other Shortness Of Breath    Cat dander and grass pollen   Bydureon [Exenatide] Other (See Comments)    Pain with injection not allergy    Past Medical History:  Diagnosis Date   Allergy    Asthma    Carpal tunnel syndrome on left    Cervical radiculitis    Chronic osteoarthritis    Corns and callosity    Depression    Dermatophytosis of foot    Diabetes mellitus without complication (HCC)    Gout    Hyperlipidemia    Hypertension    Impingement syndrome of left shoulder    Intrahepatic cholangiocarcinoma (HCC)    Lumbosacral neuritis    Microalbuminuria    Obesity    Supraventricular tachycardia ([place]) 07/01/2015   SVT (supraventricular tachycardia) (HCC)    Tenosynovitis of wrist    Vitamin D deficiency     Past Surgical History:  Procedure Laterality Date   ABDOMINAL HYSTERECTOMY     COLONOSCOPY WITH PROPOFOL N/A 05/06/2021   Procedure: COLONOSCOPY WITH PROPOFOL;  Surgeon: Lucilla Lame, MD;  Location: [facility] ENDOSCOPY;  Service: Endoscopy;  Laterality: N/A;   ESOPHAGOGASTRODUODENOSCOPY (EGD) WITH PROPOFOL N/A 05/06/2021   Procedure: ESOPHAGOGASTRODUODENOSCOPY (EGD) WITH PROPOFOL;  Surgeon: Lucilla Lame, MD;  Location: [facility] ENDOSCOPY;  Service: Endoscopy;  Laterality: N/A;   PORTA CATH INSERTION N/A 07/17/2021   Procedure: PORTA CATH INSERTION;  Surgeon: Algernon Huxley, MD;  Location: [facility];  Service: Cardiovascular;  Laterality: N/A;    Social History   Socioeconomic History   Marital status: Single    Spouse name: Not on file   Number of children: 2   Years of education: Not on file   Highest education level: Not on file  Occupational History   Occupation: disable     Comment: from chronic back pain,  2019  Tobacco Use   Smoking status: Some Days    Packs/day: 0.25    Types: Cigarettes   Smokeless tobacco: Never   Tobacco comments:    2 per day 01/30/21  Vaping Use   Vaping Use: Never used  Substance and Sexual Activity   Alcohol use: Yes    Alcohol/week: 0.0 standard drinks    Comment: rare maybe 1-2  year   Drug use: No   Sexual activity: Yes  Other Topics Concern   Not on file  Social History Narrative   She is on disability for chronic back pain. Pt's son lives with her   Social Determinants of Health   Financial Resource Strain: Medium Risk   Difficulty of Paying Living Expenses: Somewhat hard  Food Insecurity: No Food Insecurity   Worried About Charity fundraiser in the Last Year: Never true   Arboriculturist in the Last Year: Never true  Transportation Needs: No Transportation Needs   Lack of Transportation (Medical): No   Lack of Transportation (Non-Medical): No  Physical Activity: Inactive   Days of Exercise per Week: 0 days   Minutes of Exercise per Session: 0 min  Stress: Stress Concern Present   Feeling of Stress : To some extent  Social Connections: Moderately Isolated  Frequency of Communication with Friends and Family: More than three times a week   Frequency of Social Gatherings with Friends and Family: Three times a week   Attends Religious Services: More than 4 times per year   Active Member of Clubs or Organizations: No   Attends Archivist Meetings: Never   Marital Status: Never married  Human resources officer Violence: Not At Risk   Fear of Current or Ex-Partner: No   Emotionally Abused: No   Physically Abused: No   Sexually Abused: No    Family History  Problem Relation Age of Onset   Heart attack Mother    CAD Mother    Kidney disease Father      Current Outpatient Medications:    albuterol (VENTOLIN HFA) 108 (90 Base) MCG/ACT inhaler, INHALE 2 PUFFS BY MOUTH EVERY 6 HOURS AS NEEDED FOR WHEEZING AND FOR SHORTNESS OF BREATH,  Disp: 18 g, Rfl: 1   aspirin 81 MG tablet, Take 1 tablet by mouth daily., Disp: , Rfl:    atorvastatin (LIPITOR) 40 MG tablet, Take 1 tablet (40 mg total) by mouth at bedtime., Disp: 90 tablet, Rfl: 1   blood glucose meter kit and supplies, Dispense based on patient and insurance preference. Use up to four times daily as directed. (FOR ICD-10 E10.9, E11.9)., Disp: 1 each, Rfl: 0   dexamethasone (DECADRON) 4 MG tablet, Take 2 tablets (8 mg total) by mouth daily. Take daily x 3 days starting the day after cisplatin chemotherapy. Take with food. (Patient not taking: No sig reported), Disp: 30 tablet, Rfl: 1   diphenhydramine-acetaminophen (TYLENOL PM) 25-500 MG TABS tablet, Take 1 tablet by mouth at bedtime as needed., Disp: , Rfl:    Empagliflozin-metFORMIN HCl ER (SYNJARDY XR) 12.04-999 MG TB24, Take 2 tablets by mouth daily. New dose, Disp: 180 tablet, Rfl: 1   escitalopram (LEXAPRO) 10 MG tablet, Take 1 tablet (10 mg total) by mouth daily., Disp: 90 tablet, Rfl: 0   fluticasone (FLONASE) 50 MCG/ACT nasal spray, USE 2 SPRAY(S) IN EACH NOSTRIL AT BEDTIME (Patient taking differently: Place 2 sprays into both nostrils daily as needed. USE 2 SPRAY(S) IN EACH NOSTRIL AT BEDTIME), Disp: 16 g, Rfl: 0   Fluticasone-Umeclidin-Vilant (TRELEGY ELLIPTA) 100-62.5-25 MCG/INH AEPB, Take 1 puff by mouth daily., Disp: 180 each, Rfl: 1   gabapentin (NEURONTIN) 300 MG capsule, Take 2 capsules (600 mg total) by mouth 2 (two) times daily., Disp: 360 capsule, Rfl: 1   Insulin Degludec (TRESIBA) 100 UNIT/ML SOLN, Inject 12-30 units of lipase into the skin daily. To keep fasting glucose between 100-140, Disp: 15 mL, Rfl: 0   Insulin Pen Needle 31G X 8 MM MISC, 1 each by Does not apply route daily., Disp: 100 each, Rfl: 1   lidocaine-prilocaine (EMLA) cream, Apply to affected area once (Patient not taking: No sig reported), Disp: 30 g, Rfl: 3   losartan (COZAAR) 25 MG tablet, Take 1 tablet (25 mg total) by mouth daily., Disp:  90 tablet, Rfl: 0   ondansetron (ZOFRAN) 8 MG tablet, Take 1 tablet (8 mg total) by mouth 2 (two) times daily as needed. Start on the third day after cisplatin chemotherapy., Disp: 30 tablet, Rfl: 1   prochlorperazine (COMPAZINE) 10 MG tablet, Take 1 tablet (10 mg total) by mouth every 6 (six) hours as needed (Nausea or vomiting)., Disp: 30 tablet, Rfl: 1  PERIPHERAL VASCULAR CATHETERIZATION  Result Date: 07/17/2021 See surgical note for result.   No images are attached to the encounter.  CMP Latest Ref Rng & Units 07/28/2021  Glucose 70 - 99 mg/dL 209(H)  BUN 8 - 23 mg/dL 16  Creatinine 0.44 - 1.00 mg/dL 1.18(H)  Sodium 135 - 145 mmol/L 132(L)  Potassium 3.5 - 5.1 mmol/L 3.7  Chloride 98 - 111 mmol/L 100  CO2 22 - 32 mmol/L 26  Calcium 8.9 - 10.3 mg/dL 8.2(L)  Total Protein 6.5 - 8.1 g/dL 7.3  Total Bilirubin 0.3 - 1.2 mg/dL 1.4(H)  Alkaline Phos 38 - 126 U/L 751(H)  AST 15 - 41 U/L 40  ALT 0 - 44 U/L 23   CBC Latest Ref Rng & Units 07/14/2021  WBC 4.0 - 10.5 K/uL 14.6(H)  Hemoglobin 12.0 - 15.0 g/dL 8.3(L)  Hematocrit 36.0 - 46.0 % 26.1(L)  Platelets 150 - 400 K/uL 338     Observation/objective:appears in no acute distress over video visit today.  Assessment and plan: Patient is a 60 year old female with history of stage IV intrahepatic cholangiocarcinoma here for on treatment assessment prior to cycle 4-day 1 of gemcitabine cisplatin chemotherapy Wbc is 50 today likely due to neulasta. Hb down to 7.7. hold chemo today. She likely has chemo induced anemia. Will consider dose modification moving forward. Iron studies b12 and folate are normal.   She will also proceed for cycle 4-day 8 chemotherapy next week.She has repeat CT chest abdomen and pelvis with contrast scheduled for 08/12/2021.I will see her back in 3 weeks for cycle 5-day 1.  Skin rash appears to be consistent wirh irritant dermatitis. Improving. Continue to monitor  Abnormal LFT's: alk phos is high but alt/ast and  total bilirubin improving. Continue to monitor    Follow-up instructions:as above  I discussed the assessment and treatment plan with the patient. The patient was provided an opportunity to ask questions and all were answered. The patient agreed with the plan and demonstrated an understanding of the instructions.   The patient was advised to call back or seek an in-person evaluation if the symptoms worsen or if the condition fails to improve as anticipated.  Visit Diagnosis: 1. Encounter for antineoplastic chemotherapy   2. Intrahepatic cholangiocarcinoma ([place])   3. Abnormal LFTs     Dr. Randa Evens, MD, MPH [facility] at [facility] Tel- [phone] 07/28/2021 12:34 PM

## 2021-07-29 ENCOUNTER — Telehealth: Payer: Self-pay | Admitting: *Deleted

## 2021-07-29 NOTE — Chronic Care Management (AMB) (Signed)
  Care Management   Note  07/29/2021 Name: JULL HARRAL MRN: [id number] DOB: Oct 24, 1961  ALEXEI EY is a 60 y.o. year old female who is a primary care patient of Steele Sizer, MD and is actively engaged with the care management team. I reached out to [name] by phone today to assist with re-scheduling an initial visit with the Pharmacist  Follow up plan: Telephone appointment with care management team member scheduled for: 08/28/2021  Julian Hy, [place], [place] Management  Direct Dial: [phone]

## 2021-07-31 ENCOUNTER — Encounter: Payer: Self-pay | Admitting: Gastroenterology

## 2021-08-01 ENCOUNTER — Ambulatory Visit (INDEPENDENT_AMBULATORY_CARE_PROVIDER_SITE_OTHER): Payer: Medicare HMO | Admitting: *Deleted

## 2021-08-01 ENCOUNTER — Other Ambulatory Visit: Payer: Self-pay | Admitting: Family Medicine

## 2021-08-01 DIAGNOSIS — I10 Essential (primary) hypertension: Secondary | ICD-10-CM

## 2021-08-01 DIAGNOSIS — E118 Type 2 diabetes mellitus with unspecified complications: Secondary | ICD-10-CM | POA: Diagnosis not present

## 2021-08-01 DIAGNOSIS — Z599 Problem related to housing and economic circumstances, unspecified: Secondary | ICD-10-CM

## 2021-08-01 DIAGNOSIS — R69 Illness, unspecified: Secondary | ICD-10-CM | POA: Diagnosis not present

## 2021-08-01 DIAGNOSIS — F331 Major depressive disorder, recurrent, moderate: Secondary | ICD-10-CM

## 2021-08-01 MED ORDER — LOSARTAN POTASSIUM 25 MG PO TABS: 25.0000 mg | ORAL_TABLET | Freq: Every day | ORAL | 0 refills | Status: DC

## 2021-08-01 NOTE — Telephone Encounter (Signed)
Copied from [place] [id number]. Topic: Quick Communication - Rx Refill/Question >> Aug 01, 2021 10:46 AM Yvette Rack wrote: Medication: losartan (COZAAR) 25 MG tablet  Has the patient contacted their pharmacy? Yes.   (Agent: If no, request that the patient contact the pharmacy for the refill.) (Agent: If yes, when and what did the pharmacy advise?)  Preferred Pharmacy (with phone number or street name): [place], [place]  Phone: [phone]  Fax: [phone]  Agent: Please be advised that RX refills may take up to 3 business days. We ask that you follow-up with your pharmacy.

## 2021-08-01 NOTE — Patient Instructions (Signed)
Visit Information   Goals Addressed             This Visit's Progress    Manage My Emotions       Timeframe:  Long-Range Goal Priority:  Medium Start Date:    05/02/21                         Expected End Date:  08/01/21                  Follow Up Date:08/01/21   - begin personal counseling if needed - call and visit an old friend - practice relaxation or meditation daily - talk about feelings with a friend, family or spiritual advisor - practice positive thinking and self-talk     Why is this important?   When you are stressed, down or upset, your body reacts too.  For example, your blood pressure may get higher; you may have a headache or stomachache.  When your emotions get the best of you, your body's ability to fight off cold and flu gets weak.  These steps will help you manage your emotions.     Notes:         The patient verbalized understanding of instructions, educational materials, and care plan provided today and declined offer to receive copy of patient instructions, educational materials, and care plan.   No further follow up required: patient to contact this Education officer, museum with any additional community resource needs  [employer], [place] Worker  [facility] Care Management [phone]

## 2021-08-01 NOTE — Chronic Care Management (AMB) (Signed)
Chronic Care Management    Clinical Social Work Note  08/01/2021 Name: Janice Short MRN: [id number] DOB: 1961-11-17  Janice Short is a 60 y.o. year old female who is a primary care patient of Steele Sizer, MD. The CCM team was consulted to assist the patient with chronic disease management and/or care coordination needs related to: [employer]  and [place] and Resources.   Engaged with patient by telephone for follow up visit in response to provider referral for social work chronic care management and care coordination services.   Consent to Services:  The patient was given information about Chronic Care Management services, agreed to services, and gave verbal consent prior to initiation of services.  Please see initial visit note for detailed documentation.   Patient agreed to services and consent obtained.   Assessment: Review of patient past medical history, allergies, medications, and health status, including review of relevant consultants reports was performed today as part of a comprehensive evaluation and provision of chronic care management and care coordination services.     SDOH (Social Determinants of Health) assessments and interventions performed:    Advanced Directives Status: Not addressed in this encounter.  CCM Care Plan  Allergies  Allergen Reactions   Other Shortness Of Breath    Cat dander and grass pollen   Bydureon [Exenatide] Other (See Comments)    Pain with injection not allergy    Outpatient Encounter Medications as of 08/01/2021  Medication Sig   albuterol (VENTOLIN HFA) 108 (90 Base) MCG/ACT inhaler INHALE 2 PUFFS BY MOUTH EVERY 6 HOURS AS NEEDED FOR WHEEZING AND FOR SHORTNESS OF BREATH   aspirin 81 MG tablet Take 1 tablet by mouth daily.   atorvastatin (LIPITOR) 40 MG tablet Take 1 tablet (40 mg total) by mouth at bedtime.   blood glucose meter kit and supplies Dispense based on patient and insurance preference. Use up to  four times daily as directed. (FOR ICD-10 E10.9, E11.9).   dexamethasone (DECADRON) 4 MG tablet Take 2 tablets (8 mg total) by mouth daily. Take daily x 3 days starting the day after cisplatin chemotherapy. Take with food. (Patient not taking: No sig reported)   diphenhydramine-acetaminophen (TYLENOL PM) 25-500 MG TABS tablet Take 1 tablet by mouth at bedtime as needed.   Empagliflozin-metFORMIN HCl ER (SYNJARDY XR) 12.04-999 MG TB24 Take 2 tablets by mouth daily. New dose   escitalopram (LEXAPRO) 10 MG tablet Take 1 tablet (10 mg total) by mouth daily.   fluticasone (FLONASE) 50 MCG/ACT nasal spray USE 2 SPRAY(S) IN EACH NOSTRIL AT BEDTIME (Patient taking differently: Place 2 sprays into both nostrils daily as needed. USE 2 SPRAY(S) IN EACH NOSTRIL AT BEDTIME)   Fluticasone-Umeclidin-Vilant (TRELEGY ELLIPTA) 100-62.5-25 MCG/INH AEPB Take 1 puff by mouth daily.   gabapentin (NEURONTIN) 300 MG capsule Take 2 capsules (600 mg total) by mouth 2 (two) times daily.   Insulin Degludec (TRESIBA) 100 UNIT/ML SOLN Inject 12-30 units of lipase into the skin daily. To keep fasting glucose between 100-140   Insulin Pen Needle 31G X 8 MM MISC 1 each by Does not apply route daily.   lidocaine-prilocaine (EMLA) cream Apply to affected area once (Patient not taking: No sig reported)   losartan (COZAAR) 25 MG tablet Take 1 tablet (25 mg total) by mouth daily.   ondansetron (ZOFRAN) 8 MG tablet Take 1 tablet (8 mg total) by mouth 2 (two) times daily as needed. Start on the third day after cisplatin chemotherapy.   prochlorperazine (  COMPAZINE) 10 MG tablet Take 1 tablet (10 mg total) by mouth every 6 (six) hours as needed (Nausea or vomiting).   No facility-administered encounter medications on file as of 08/01/2021.    Patient Active Problem List   Diagnosis Date Noted   Screen for colon cancer    Rectal polyp    Polyp of sigmoid colon    Abnormal findings on diagnostic imaging of digestive system    Gastritis  with hemorrhage    Polyp of duodenum    Intrahepatic cholangiocarcinoma ([place]) 04/12/2021   Goals of care, counseling/discussion 04/12/2021   Mild episode of recurrent major depressive disorder ([place]) 02/08/2019   Dyslipidemia associated with type 2 diabetes mellitus ([place]) 12/17/2017   Noncompliance with medication regimen 12/17/2017   Spinal stenosis 10/15/2016   Diabetic polyneuropathy associated with type 2 diabetes mellitus ([place]) 07/14/2016   Chronic midline low back pain with sciatica 06/22/2016   Primary osteoarthritis of both knees 06/22/2016   Carpal tunnel syndrome 07/01/2015   Cervical radiculitis 07/01/2015   Corn or callus 07/01/2015   Impingement syndrome of shoulder 07/01/2015   Microalbuminuria 07/01/2015   Compulsive tobacco user syndrome 07/01/2015   Tenosynovitis of wrist 07/01/2015   Benign hypertension 11/05/2009   Uncontrolled type 2 diabetes mellitus with microalbuminuria ([place]) 11/05/2009   Athlete's foot 08/05/2009   Combined fat and carbohydrate induced hyperlipemia 01/08/2009   Vitamin D deficiency 01/08/2009   Asthma, mild intermittent 12/28/2008   Allergic rhinitis 12/28/2008   Lumbosacral neuritis 08/11/2007    Conditions to be addressed/monitored: Recently diagnosed with cancer/cholangiocarcinoma ; Financial constraints related to medical treatments and Mental Health Concerns   Care Plan : Depression (Adult)  Updates made by Vern Claude, LCSW since 08/01/2021 12:00 AM     Problem: Symptoms (Depression)      Long-Range Goal: Symptoms Monitored and Managed   Start Date: 05/02/2021  Expected End Date: 12/02/2021  Recent Progress: On track  Priority: Medium  Note:   Current Barriers:  Acute Mental Health needs related to recent diagnosis Mental Health Concerns  Suicidal Ideation/Homicidal Ideation: No  Clinical Social Work Goal(s):  Over the next 90 days, patient will work with SW bi-weekly by telephone or in person to reduce or manage  symptoms related to depression patient will work with SW to address concerns related to depressed mood due to recent cancer diagnosis  Interventions: SDOH Interventions    Flowsheet Row Most Recent Value  SDOH Interventions   SDOH Interventions for the Following Domains Depression  Depression Interventions/Treatment  Counseling, Medication     Follow up phone call to patient regarding her recent diagnosis and need for continued social work follow up Patient discussed feeling much better since, port placed for treatment  last week-patient continues to state that "God's taking me through" Patient confirms receiving assistance with her medical bill-issue resolved Patient verbalized having no additional community resource needs at this time Patient encouraged to contact this Education officer, museum with any additional community resource needs Emotional support and reassurance provided Discussed plans with patient for ongoing care management follow up and provided patient with direct contact information for care management team if needed in the future  Patient Self Care Activities:  Performs ADL's independently Performs IADL's independently Ability for insight Strong family or social support  Patient Coping Strengths:  Supportive Relationships Family Friends Able to Communicate Effectively  Patient Self Care Deficits:  Coping struggles related to recent diagnosis         Follow Up Plan: Client will  contact this Education officer, museum with any additional community resource needs      [employer], [place] Worker  [facility] Care Management [phone]

## 2021-08-04 ENCOUNTER — Inpatient Hospital Stay: Payer: Medicare HMO

## 2021-08-04 ENCOUNTER — Other Ambulatory Visit: Payer: Self-pay | Admitting: Oncology

## 2021-08-04 ENCOUNTER — Other Ambulatory Visit: Payer: Self-pay

## 2021-08-04 VITALS — BP 129/75 | HR 87 | Temp 96.7°F | Resp 18 | Wt 152.0 lb

## 2021-08-04 DIAGNOSIS — D649 Anemia, unspecified: Secondary | ICD-10-CM | POA: Diagnosis not present

## 2021-08-04 DIAGNOSIS — R7989 Other specified abnormal findings of blood chemistry: Secondary | ICD-10-CM | POA: Diagnosis not present

## 2021-08-04 DIAGNOSIS — C221 Intrahepatic bile duct carcinoma: Secondary | ICD-10-CM | POA: Diagnosis not present

## 2021-08-04 DIAGNOSIS — I10 Essential (primary) hypertension: Secondary | ICD-10-CM | POA: Diagnosis not present

## 2021-08-04 DIAGNOSIS — Z79899 Other long term (current) drug therapy: Secondary | ICD-10-CM | POA: Diagnosis not present

## 2021-08-04 DIAGNOSIS — Z7982 Long term (current) use of aspirin: Secondary | ICD-10-CM | POA: Diagnosis not present

## 2021-08-04 DIAGNOSIS — R69 Illness, unspecified: Secondary | ICD-10-CM | POA: Diagnosis not present

## 2021-08-04 DIAGNOSIS — E785 Hyperlipidemia, unspecified: Secondary | ICD-10-CM | POA: Diagnosis not present

## 2021-08-04 DIAGNOSIS — E119 Type 2 diabetes mellitus without complications: Secondary | ICD-10-CM | POA: Diagnosis not present

## 2021-08-04 DIAGNOSIS — Z5111 Encounter for antineoplastic chemotherapy: Secondary | ICD-10-CM | POA: Diagnosis not present

## 2021-08-04 LAB — CBC WITH DIFFERENTIAL/PLATELET
Abs Immature Granulocytes: 4.6 10*3/uL — ABNORMAL HIGH (ref 0.00–0.07)
Basophils Absolute: 0 10*3/uL (ref 0.0–0.1)
Basophils Relative: 0 %
Blasts: 1 %
Eosinophils Absolute: 0.4 10*3/uL (ref 0.0–0.5)
Eosinophils Relative: 1 %
HCT: 26 % — ABNORMAL LOW (ref 36.0–46.0)
Hemoglobin: 8 g/dL — ABNORMAL LOW (ref 12.0–15.0)
Lymphocytes Relative: 13 %
Lymphs Abs: 4.6 10*3/uL — ABNORMAL HIGH (ref 0.7–4.0)
MCH: 34.5 pg — ABNORMAL HIGH (ref 26.0–34.0)
MCHC: 30.8 g/dL (ref 30.0–36.0)
MCV: 112.1 fL — ABNORMAL HIGH (ref 80.0–100.0)
Metamyelocytes Relative: 1 %
Monocytes Absolute: 1.8 10*3/uL — ABNORMAL HIGH (ref 0.1–1.0)
Monocytes Relative: 5 %
Myelocytes: 8 %
Neutro Abs: 23.9 10*3/uL — ABNORMAL HIGH (ref 1.7–7.7)
Neutrophils Relative %: 67 %
Platelets: 324 10*3/uL (ref 150–400)
Promyelocytes Relative: 4 %
RBC: 2.32 MIL/uL — ABNORMAL LOW (ref 3.87–5.11)
RDW: 21.9 % — ABNORMAL HIGH (ref 11.5–15.5)
Smear Review: NORMAL
WBC: 35.7 10*3/uL — ABNORMAL HIGH (ref 4.0–10.5)
nRBC: 2.5 % — ABNORMAL HIGH (ref 0.0–0.2)

## 2021-08-04 LAB — MAGNESIUM: Magnesium: 2.1 mg/dL (ref 1.7–2.4)

## 2021-08-04 LAB — COMPREHENSIVE METABOLIC PANEL
ALT: 23 U/L (ref 0–44)
AST: 43 U/L — ABNORMAL HIGH (ref 15–41)
Albumin: 2.4 g/dL — ABNORMAL LOW (ref 3.5–5.0)
Alkaline Phosphatase: 578 U/L — ABNORMAL HIGH (ref 38–126)
Anion gap: 6 (ref 5–15)
BUN: 14 mg/dL (ref 8–23)
CO2: 24 mmol/L (ref 22–32)
Calcium: 8.4 mg/dL — ABNORMAL LOW (ref 8.9–10.3)
Chloride: 105 mmol/L (ref 98–111)
Creatinine, Ser: 0.91 mg/dL (ref 0.44–1.00)
GFR, Estimated: >60 mL/min (ref >60)
Glucose, Bld: 208 mg/dL — ABNORMAL HIGH (ref 70–99)
Potassium: 4.4 mmol/L (ref 3.5–5.1)
Sodium: 135 mmol/L (ref 135–145)
Total Bilirubin: 1.2 mg/dL (ref 0.3–1.2)
Total Protein: 7.3 g/dL (ref 6.5–8.1)

## 2021-08-04 MED ORDER — SODIUM CHLORIDE 0.9 % IV SOLN
10.0000 mg | Freq: Once | INTRAVENOUS | Status: AC
  Administered 2021-08-04: 10 mg via INTRAVENOUS
  Filled 2021-08-04: qty 10

## 2021-08-04 MED ORDER — POTASSIUM CHLORIDE IN NACL 20-0.9 MEQ/L-% IV SOLN
Freq: Once | INTRAVENOUS | Status: AC
Start: 2021-08-04 — End: 2021-08-04
  Filled 2021-08-04: qty 1000

## 2021-08-04 MED ORDER — SODIUM CHLORIDE 0.9 % IV SOLN
1400.0000 mg | Freq: Once | INTRAVENOUS | Status: AC
  Administered 2021-08-04: 1400 mg via INTRAVENOUS
  Filled 2021-08-04: qty 26.3

## 2021-08-04 MED ORDER — SODIUM CHLORIDE 0.9 % IV SOLN
25.0000 mg/m2 | Freq: Once | INTRAVENOUS | Status: AC
  Administered 2021-08-04: 45 mg via INTRAVENOUS
  Filled 2021-08-04: qty 45

## 2021-08-04 MED ORDER — HEPARIN SOD (PORK) LOCK FLUSH 100 UNIT/ML IV SOLN
500.0000 [IU] | Freq: Once | INTRAVENOUS | Status: AC | PRN
  Filled 2021-08-04: qty 5

## 2021-08-04 MED ORDER — MAGNESIUM SULFATE 2 GM/50ML IV SOLN
2.0000 g | Freq: Once | INTRAVENOUS | Status: AC
  Administered 2021-08-04: 2 g via INTRAVENOUS
  Filled 2021-08-04: qty 50

## 2021-08-04 MED ORDER — PALONOSETRON HCL INJECTION 0.25 MG/5ML
0.2500 mg | Freq: Once | INTRAVENOUS | Status: AC
  Administered 2021-08-04: 0.25 mg via INTRAVENOUS
  Filled 2021-08-04: qty 5

## 2021-08-04 MED ORDER — CYANOCOBALAMIN 1000 MCG/ML IJ SOLN
1000.0000 ug | INTRAMUSCULAR | Status: DC
  Administered 2021-08-04: 1000 ug via INTRAMUSCULAR
  Filled 2021-08-04: qty 1

## 2021-08-04 MED ORDER — SODIUM CHLORIDE 0.9 % IV SOLN
Freq: Once | INTRAVENOUS | Status: AC
  Filled 2021-08-04: qty 250

## 2021-08-04 MED ORDER — SODIUM CHLORIDE 0.9 % IV SOLN
150.0000 mg | Freq: Once | INTRAVENOUS | Status: AC
  Administered 2021-08-04: 150 mg via INTRAVENOUS
  Filled 2021-08-04: qty 150

## 2021-08-04 MED ORDER — HEPARIN SOD (PORK) LOCK FLUSH 100 UNIT/ML IV SOLN
INTRAVENOUS | Status: AC
  Administered 2021-08-04: 500 [IU]
  Filled 2021-08-04: qty 5

## 2021-08-04 NOTE — Patient Instructions (Signed)
[facility] ONCOLOGY   Discharge Instructions: Thank you for choosing [facility] to provide your oncology and hematology care.  If you have a lab appointment with the [facility], please go directly to the [facility] and check in at the registration area.  Wear comfortable clothing and clothing appropriate for easy access to any Portacath or PICC line.   We strive to give you quality time with your provider. You may need to reschedule your appointment if you arrive late (15 or more minutes).  Arriving late affects you and other patients whose appointments are after yours.  Also, if you miss three or more appointments without notifying the office, you may be dismissed from the clinic at the provider's discretion.      For prescription refill requests, have your pharmacy contact our office and allow 72 hours for refills to be completed.    Today you received the following chemotherapy and/or immunotherapy agents: Gemzar and Cisplatin. Today you also received the following: Vitamin B-12 injection.      To help prevent nausea and vomiting after your treatment, we encourage you to take your nausea medication as directed.  BELOW ARE SYMPTOMS THAT SHOULD BE REPORTED IMMEDIATELY: *FEVER GREATER THAN 100.4 F (38 C) OR HIGHER *CHILLS OR SWEATING *NAUSEA AND VOMITING THAT IS NOT CONTROLLED WITH YOUR NAUSEA MEDICATION *UNUSUAL SHORTNESS OF BREATH *UNUSUAL BRUISING OR BLEEDING *URINARY PROBLEMS (pain or burning when urinating, or frequent urination) *BOWEL PROBLEMS (unusual diarrhea, constipation, pain near the anus) TENDERNESS IN MOUTH AND THROAT WITH OR WITHOUT PRESENCE OF ULCERS (sore throat, sores in mouth, or a toothache) UNUSUAL RASH, SWELLING OR PAIN  UNUSUAL VAGINAL DISCHARGE OR ITCHING   Items with * indicate a potential emergency and should be followed up as soon as possible or go to the Emergency Department if any problems should  occur.  Please show the CHEMOTHERAPY ALERT CARD or IMMUNOTHERAPY ALERT CARD at check-in to the Emergency Department and triage nurse.  Should you have questions after your visit or need to cancel or reschedule your appointment, please contact [facility]  [phone] and follow the prompts.  Office hours are 8:00 a.m. to 4:30 p.m. Monday - Friday. Please note that voicemails left after 4:00 p.m. may not be returned until the following business day.  We are closed weekends and major holidays. You have access to a nurse at all times for urgent questions. Please call the main number to the clinic [phone] and follow the prompts.  For any non-urgent questions, you may also contact your provider using MyChart. We now offer e-Visits for anyone 20 and older to request care online for non-urgent symptoms. For details visit [email or web address].   Also download the MyChart app! Go to the app store, search "MyChart", open the app, select [facility], and log in with your MyChart username and password.  Due to Covid, a mask is required upon entering the hospital/clinic. If you do not have a mask, one will be given to you upon arrival. For doctor visits, patients may have 1 support person aged 21 or older with them. For treatment visits, patients cannot have anyone with them due to current Covid guidelines and our immunocompromised population.

## 2021-08-04 NOTE — Progress Notes (Signed)
MD, Dr. Janese Banks, notified and aware or today's lab results. Per MD order: proceed with scheduled Gemzar and Cisplatin treatment today. Also, per MD order: administer B-12 injection today; MD is placing order in supportive therapy plan.

## 2021-08-06 NOTE — Telephone Encounter (Signed)
LVM to schedule an appt for med refill for BP medicine only. Other meds that were requested have been refilled

## 2021-08-08 ENCOUNTER — Inpatient Hospital Stay: Payer: Medicare HMO | Attending: Hospice and Palliative Medicine | Admitting: Hospice and Palliative Medicine

## 2021-08-08 ENCOUNTER — Telehealth: Payer: Self-pay

## 2021-08-08 DIAGNOSIS — D6481 Anemia due to antineoplastic chemotherapy: Secondary | ICD-10-CM | POA: Insufficient documentation

## 2021-08-08 DIAGNOSIS — Z5111 Encounter for antineoplastic chemotherapy: Secondary | ICD-10-CM | POA: Insufficient documentation

## 2021-08-08 DIAGNOSIS — Z515 Encounter for palliative care: Secondary | ICD-10-CM

## 2021-08-08 DIAGNOSIS — C221 Intrahepatic bile duct carcinoma: Secondary | ICD-10-CM | POA: Insufficient documentation

## 2021-08-08 NOTE — Telephone Encounter (Signed)
  Care Management   Follow Up Note   08/08/2021 Name: Janice Short MRN: [id number] DOB: October 13, 1961   Primary Care Provider: Steele Sizer, MD Reason for referral : Chronic Care Management   An unsuccessful telephone outreach was attempted today. The patient was referred to the case management team for assistance with care management and care coordination.    Follow Up Plan:  A HIPAA compliant voice message was left today requesting a return call.   [facility] Care Management [facility] [phone]

## 2021-08-08 NOTE — Progress Notes (Signed)
Unable to reach patient.  Voicemail left. °

## 2021-08-12 ENCOUNTER — Ambulatory Visit
Admission: RE | Admit: 2021-08-12 | Discharge: 2021-08-12 | Disposition: A | Discharge status: Home / Self Care | Payer: Medicare HMO | Source: Ambulatory Visit | Attending: Oncology | Admitting: Oncology

## 2021-08-12 ENCOUNTER — Other Ambulatory Visit: Payer: Self-pay

## 2021-08-12 DIAGNOSIS — C221 Intrahepatic bile duct carcinoma: Secondary | ICD-10-CM | POA: Insufficient documentation

## 2021-08-12 MED ORDER — IOHEXOL 350 MG/ML SOLN
75.0000 mL | Freq: Once | INTRAVENOUS | Status: AC | PRN
  Administered 2021-08-12: 75 mL via INTRAVENOUS

## 2021-08-18 ENCOUNTER — Inpatient Hospital Stay: Payer: Medicare HMO

## 2021-08-18 ENCOUNTER — Telehealth: Payer: Self-pay | Admitting: *Deleted

## 2021-08-18 ENCOUNTER — Inpatient Hospital Stay: Payer: Medicare HMO | Admitting: Oncology

## 2021-08-18 NOTE — Telephone Encounter (Signed)
Called patient to see why she was unable to come in today.  She says she has been having sinus issues and a runny nose with a low-grade temperature started on Sunday.  I asked her if she had gotten a COVID test.  She said no.  I told her that if she continues to have these symptoms with fever in the next 2 to 3 days she does need to go somewhere and get a COVID test.  I will reschedule her for 1 week out.  Patient is agreeable to this

## 2021-08-19 ENCOUNTER — Telehealth: Payer: Self-pay | Admitting: Oncology

## 2021-08-19 NOTE — Telephone Encounter (Signed)
Left VM with patient to confirm her r/s appointments on 9/19 (patient has been sick).

## 2021-08-25 ENCOUNTER — Inpatient Hospital Stay: Payer: Medicare HMO

## 2021-08-25 ENCOUNTER — Telehealth: Payer: Self-pay

## 2021-08-25 ENCOUNTER — Encounter: Payer: Self-pay | Admitting: Oncology

## 2021-08-25 ENCOUNTER — Ambulatory Visit: Payer: Medicare HMO

## 2021-08-25 ENCOUNTER — Inpatient Hospital Stay (HOSPITAL_BASED_OUTPATIENT_CLINIC_OR_DEPARTMENT_OTHER): Payer: Medicare HMO | Admitting: Oncology

## 2021-08-25 ENCOUNTER — Other Ambulatory Visit: Payer: Medicare HMO

## 2021-08-25 ENCOUNTER — Other Ambulatory Visit: Payer: Self-pay

## 2021-08-25 VITALS — BP 131/63 | HR 87 | Temp 96.9°F | Resp 16 | Wt 142.7 lb

## 2021-08-25 DIAGNOSIS — C221 Intrahepatic bile duct carcinoma: Secondary | ICD-10-CM

## 2021-08-25 DIAGNOSIS — D6481 Anemia due to antineoplastic chemotherapy: Secondary | ICD-10-CM | POA: Diagnosis not present

## 2021-08-25 DIAGNOSIS — Z5111 Encounter for antineoplastic chemotherapy: Secondary | ICD-10-CM | POA: Diagnosis not present

## 2021-08-25 LAB — CBC WITH DIFFERENTIAL/PLATELET
Abs Immature Granulocytes: 0.03 10*3/uL (ref 0.00–0.07)
Basophils Absolute: 0.1 10*3/uL (ref 0.0–0.1)
Basophils Relative: 1 %
Eosinophils Absolute: 0.1 10*3/uL (ref 0.0–0.5)
Eosinophils Relative: 1 %
HCT: 30.7 % — ABNORMAL LOW (ref 36.0–46.0)
Hemoglobin: 9.6 g/dL — ABNORMAL LOW (ref 12.0–15.0)
Immature Granulocytes: 1 %
Lymphocytes Relative: 21 %
Lymphs Abs: 1.4 10*3/uL (ref 0.7–4.0)
MCH: 33.1 pg (ref 26.0–34.0)
MCHC: 31.3 g/dL (ref 30.0–36.0)
MCV: 105.9 fL — ABNORMAL HIGH (ref 80.0–100.0)
Monocytes Absolute: 1.1 10*3/uL — ABNORMAL HIGH (ref 0.1–1.0)
Monocytes Relative: 17 %
Neutro Abs: 3.9 10*3/uL (ref 1.7–7.7)
Neutrophils Relative %: 59 %
Platelets: 202 10*3/uL (ref 150–400)
RBC: 2.9 MIL/uL — ABNORMAL LOW (ref 3.87–5.11)
RDW: 17.8 % — ABNORMAL HIGH (ref 11.5–15.5)
Smear Review: NORMAL
WBC: 6.5 10*3/uL (ref 4.0–10.5)
nRBC: 0 % (ref 0.0–0.2)

## 2021-08-25 LAB — COMPREHENSIVE METABOLIC PANEL
ALT: 23 U/L (ref 0–44)
AST: 43 U/L — ABNORMAL HIGH (ref 15–41)
Albumin: 2.8 g/dL — ABNORMAL LOW (ref 3.5–5.0)
Alkaline Phosphatase: 541 U/L — ABNORMAL HIGH (ref 38–126)
Anion gap: 8 (ref 5–15)
BUN: 15 mg/dL (ref 8–23)
CO2: 19 mmol/L — ABNORMAL LOW (ref 22–32)
Calcium: 8.4 mg/dL — ABNORMAL LOW (ref 8.9–10.3)
Chloride: 107 mmol/L (ref 98–111)
Creatinine, Ser: 1.1 mg/dL — ABNORMAL HIGH (ref 0.44–1.00)
GFR, Estimated: 57 mL/min — ABNORMAL LOW (ref >60)
Glucose, Bld: 231 mg/dL — ABNORMAL HIGH (ref 70–99)
Potassium: 4.3 mmol/L (ref 3.5–5.1)
Sodium: 134 mmol/L — ABNORMAL LOW (ref 135–145)
Total Bilirubin: 1.1 mg/dL (ref 0.3–1.2)
Total Protein: 7.8 g/dL (ref 6.5–8.1)

## 2021-08-25 LAB — MAGNESIUM: Magnesium: 1.9 mg/dL (ref 1.7–2.4)

## 2021-08-25 MED ORDER — PALONOSETRON HCL INJECTION 0.25 MG/5ML
0.2500 mg | Freq: Once | INTRAVENOUS | Status: AC
  Administered 2021-08-25: 0.25 mg via INTRAVENOUS
  Filled 2021-08-25: qty 5

## 2021-08-25 MED ORDER — SODIUM CHLORIDE 0.9 % IV SOLN
10.0000 mg | Freq: Once | INTRAVENOUS | Status: AC
  Administered 2021-08-25: 10 mg via INTRAVENOUS
  Filled 2021-08-25: qty 10

## 2021-08-25 MED ORDER — SODIUM CHLORIDE 0.9 % IV SOLN
150.0000 mg | Freq: Once | INTRAVENOUS | Status: AC
  Administered 2021-08-25: 150 mg via INTRAVENOUS
  Filled 2021-08-25: qty 150

## 2021-08-25 MED ORDER — POTASSIUM CHLORIDE IN NACL 20-0.9 MEQ/L-% IV SOLN
Freq: Once | INTRAVENOUS | Status: AC
  Filled 2021-08-25: qty 1000

## 2021-08-25 MED ORDER — SODIUM CHLORIDE 0.9 % IV SOLN
1400.0000 mg | Freq: Once | INTRAVENOUS | Status: AC
  Administered 2021-08-25: 1400 mg via INTRAVENOUS
  Filled 2021-08-25: qty 10.52

## 2021-08-25 MED ORDER — SODIUM CHLORIDE 0.9% FLUSH
10.0000 mL | Freq: Once | INTRAVENOUS | Status: AC
  Administered 2021-08-25: 10 mL via INTRAVENOUS
  Filled 2021-08-25: qty 10

## 2021-08-25 MED ORDER — SODIUM CHLORIDE 0.9 % IV SOLN
25.0000 mg/m2 | Freq: Once | INTRAVENOUS | Status: AC
  Administered 2021-08-25: 45 mg via INTRAVENOUS
  Filled 2021-08-25: qty 45

## 2021-08-25 MED ORDER — SODIUM CHLORIDE 0.9 % IV SOLN
Freq: Once | INTRAVENOUS | Status: AC
  Filled 2021-08-25: qty 250

## 2021-08-25 MED ORDER — HEPARIN SOD (PORK) LOCK FLUSH 100 UNIT/ML IV SOLN
500.0000 [IU] | Freq: Once | INTRAVENOUS | Status: AC | PRN
  Filled 2021-08-25: qty 5

## 2021-08-25 MED ORDER — MAGNESIUM SULFATE 2 GM/50ML IV SOLN
2.0000 g | Freq: Once | INTRAVENOUS | Status: AC
  Administered 2021-08-25: 2 g via INTRAVENOUS
  Filled 2021-08-25: qty 50

## 2021-08-25 MED ORDER — HEPARIN SOD (PORK) LOCK FLUSH 100 UNIT/ML IV SOLN
INTRAVENOUS | Status: AC
  Administered 2021-08-25: 500 [IU]
  Filled 2021-08-25: qty 5

## 2021-08-25 NOTE — Progress Notes (Signed)
Nutrition Assessment   Reason for Assessment:   RD screen weight loss   ASSESSMENT:  60 year old female with stage IV intrahepatic cholangiocarcinoma.  Past medical history of HLD, DM, HTN.  Patient receiving cisplatin and gemcitabine.    Met with patient during infusion.  Patient reports that her appetite has been down due to sinus issues and not feeling well.  Says that she is feeling better and appetite is getting better.  Planning on going to [employer] after treatment today.  Says that she usually eats yogurt for breakfast, nibbles at lunch and has supper.  Made some spaghetti recently.  "I am going to work on eating better."     Medications: dexamethasone, zofran, compazine, tresiba   Labs: glucose 231   Anthropometrics:   Height: 67 inches Weight: 142 lb 11.2 oz 155 lb 11.2 04/2021 BMI: 22 8% weight loss in the last 4 months, concerning  Estimated Energy Needs  Kcals: 1600-1900 Protein: 80-95 g Fluid: 1.6 L   NUTRITION DIAGNOSIS: Inadequate oral intake related to sinus infection/cancer as evidenced by 8% weight loss in the last 4 months   INTERVENTION:  Discussed importance of nutrition during treatment Encouraged small frequent meals  Encouraged protein food at every meal and reviewed foods that contain protein.  Contact information provided   MONITORING, EVALUATION, GOAL: weight trends, intake   Next Visit: Monday, Sept 26 during infusion  Antoinetta Berrones B. Zenia Resides, [place], [place] Registered Dietitian [phone] (mobile)

## 2021-08-25 NOTE — Progress Notes (Signed)
Chronic Care Management Pharmacy Assistant   Name: Janice Short  MRN: [id number] DOB: 06-11-1961  Chart Review for clinical pharmacist on 08/28/2021.   Conditions to be addressed/monitored: HTN, HLD, DMII, Depression, and  Asthma, Carpal Tunnel Syndrome, Chronic midline low back pain with sciatica, Spinal stenosis, Vit D Deficiency, Lumbosacral Neuritis, Intrahepatic Cholangiocarcinoma  Primary concerns for visit include: None ID   Recent office visits:  08/01/2021 Chrystal Land LCSW (CCM) No medication changes noted 06/23/2021 Chrystal Land LCSW (CCM) No medication changes noted 06/20/2021 Chrystal Land LCSW (CCM) No medication changes noted 05/16/2021 Chrystal Land LCSW (CCM) No medication changes noted  Recent consult visits:  07/28/2021 Dr. Smith Robert MD (Oncology) No Medication Changes noted 07/07/2021 Dr. Smith Robert MD (Oncology)No Medication Changes noted 06/16/2021 Dr. Smith Robert MD (Oncology) No Medication Changes noted 06/13/2021 Laurette Schimke NP (Oncology) No Medication Changes noted 05/26/2021 Dr. Smith Robert MD (Oncology) Patient agreed to starting Chemo 05/15/2021 Laurette Schimke NP (Oncology) Follow-up 1 month  Hospital visits:  Medication Reconciliation was completed by comparing discharge summary, patient's EMR and Pharmacy list, and upon discussion with patient.  Admitted to the hospital on 07/17/2021  due to Intrahepatic bile duct carcinoma. Discharge date was 07/17/2021. Discharged from [facility].    New?Medications Started at [facility] Discharge:?? -started None  Medication Changes at Hospital Discharge: -Changed None  Medications Discontinued at Hospital Discharge: -Stopped None  Medications that remain the same after Hospital Discharge:??  -All other medications will remain the same.     Left Voice message to do initial question prior to patient appointment on 08/28/2021 for CCM at 3:00 pm  with Angelena Sole the Clinical pharmacist.    Medications: Outpatient Encounter Medications as of 08/25/2021  Medication Sig   albuterol (VENTOLIN HFA) 108 (90 Base) MCG/ACT inhaler INHALE 2 PUFFS BY MOUTH EVERY 6 HOURS AS NEEDED FOR WHEEZING AND FOR SHORTNESS OF BREATH   aspirin 81 MG tablet Take 1 tablet by mouth daily.   atorvastatin (LIPITOR) 40 MG tablet Take 1 tablet (40 mg total) by mouth at bedtime.   blood glucose meter kit and supplies Dispense based on patient and insurance preference. Use up to four times daily as directed. (FOR ICD-10 E10.9, E11.9).   dexamethasone (DECADRON) 4 MG tablet Take 2 tablets (8 mg total) by mouth daily. Take daily x 3 days starting the day after cisplatin chemotherapy. Take with food. (Patient not taking: No sig reported)   diphenhydramine-acetaminophen (TYLENOL PM) 25-500 MG TABS tablet Take 1 tablet by mouth at bedtime as needed.   Empagliflozin-metFORMIN HCl ER (SYNJARDY XR) 12.04-999 MG TB24 Take 2 tablets by mouth daily. New dose   escitalopram (LEXAPRO) 10 MG tablet Take 1 tablet by mouth once daily   fluticasone (FLONASE) 50 MCG/ACT nasal spray USE 2 SPRAY(S) IN EACH NOSTRIL AT BEDTIME (Patient taking differently: Place 2 sprays into both nostrils daily as needed. USE 2 SPRAY(S) IN EACH NOSTRIL AT BEDTIME)   Fluticasone-Umeclidin-Vilant (TRELEGY ELLIPTA) 100-62.5-25 MCG/INH AEPB Take 1 puff by mouth daily.   gabapentin (NEURONTIN) 300 MG capsule Take 2 capsules (600 mg total) by mouth 2 (two) times daily.   Insulin Degludec (TRESIBA) 100 UNIT/ML SOLN Inject 12-30 units of lipase into the skin daily. To keep fasting glucose between 100-140   Insulin Pen Needle 31G X 8 MM MISC 1 each by Does not apply route daily.   lidocaine-prilocaine (EMLA) cream Apply to affected area once (Patient not taking: No sig reported)   losartan (COZAAR) 25 MG tablet Take 1  tablet (25 mg total) by mouth daily.   ondansetron (ZOFRAN) 8 MG tablet Take 1 tablet (8 mg total) by mouth 2 (two) times daily as needed. Start  on the third day after cisplatin chemotherapy.   prochlorperazine (COMPAZINE) 10 MG tablet Take 1 tablet (10 mg total) by mouth every 6 (six) hours as needed (Nausea or vomiting).   No facility-administered encounter medications on file as of 08/25/2021.    Care Gaps: COVID-19 Vaccine Ophthalmology Exam Shingrix Vaccine Influenza Vaccine Star Rating Drugs: Losartan 25 mg ast filled 08/01/2021 for 90 day supply at [facility]. Atorvastatin 40 mg last filled 08/13/2021 for 90 day supply at [facility]. Synjardy 12.04-999 mg ast filled 12/27/2020 for 90 day supply at [facility]. Medication Fill Gaps: TRELEGY100-62.5-25 MCG/INH AEPB last filled 12/27/2020 for 90 day supply. TRESIBA 100 UNIT/ML last filled 04/29/2021 for 50 day supply.  [facility] Pharmacist Assistant [phone]

## 2021-08-25 NOTE — Patient Instructions (Signed)
[facility] ONCOLOGY  Discharge Instructions: Thank you for choosing [facility] to provide your oncology and hematology care.  If you have a lab appointment with the [facility], please go directly to the [facility] and check in at the registration area.  Wear comfortable clothing and clothing appropriate for easy access to any Portacath or PICC line.   We strive to give you quality time with your provider. You may need to reschedule your appointment if you arrive late (15 or more minutes).  Arriving late affects you and other patients whose appointments are after yours.  Also, if you miss three or more appointments without notifying the office, you may be dismissed from the clinic at the provider's discretion.      For prescription refill requests, have your pharmacy contact our office and allow 72 hours for refills to be completed.    Today you received the following chemotherapy and/or immunotherapy agents Cisplatin & Gemzar    To help prevent nausea and vomiting after your treatment, we encourage you to take your nausea medication as directed.  BELOW ARE SYMPTOMS THAT SHOULD BE REPORTED IMMEDIATELY: *FEVER GREATER THAN 100.4 F (38 C) OR HIGHER *CHILLS OR SWEATING *NAUSEA AND VOMITING THAT IS NOT CONTROLLED WITH YOUR NAUSEA MEDICATION *UNUSUAL SHORTNESS OF BREATH *UNUSUAL BRUISING OR BLEEDING *URINARY PROBLEMS (pain or burning when urinating, or frequent urination) *BOWEL PROBLEMS (unusual diarrhea, constipation, pain near the anus) TENDERNESS IN MOUTH AND THROAT WITH OR WITHOUT PRESENCE OF ULCERS (sore throat, sores in mouth, or a toothache) UNUSUAL RASH, SWELLING OR PAIN  UNUSUAL VAGINAL DISCHARGE OR ITCHING   Items with * indicate a potential emergency and should be followed up as soon as possible or go to the Emergency Department if any problems should occur.  Please show the CHEMOTHERAPY ALERT CARD or IMMUNOTHERAPY ALERT CARD at  check-in to the Emergency Department and triage nurse.  Should you have questions after your visit or need to cancel or reschedule your appointment, please contact [facility]  [phone] and follow the prompts.  Office hours are 8:00 a.m. to 4:30 p.m. Monday - Friday. Please note that voicemails left after 4:00 p.m. may not be returned until the following business day.  We are closed weekends and major holidays. You have access to a nurse at all times for urgent questions. Please call the main number to the clinic [phone] and follow the prompts.  For any non-urgent questions, you may also contact your provider using MyChart. We now offer e-Visits for anyone 2 and older to request care online for non-urgent symptoms. For details visit [email or web address].   Also download the MyChart app! Go to the app store, search "MyChart", open the app, select [facility], and log in with your MyChart username and password.  Due to Covid, a mask is required upon entering the hospital/clinic. If you do not have a mask, one will be given to you upon arrival. For doctor visits, patients may have 1 support person aged 72 or older with them. For treatment visits, patients cannot have anyone with them due to current Covid guidelines and our immunocompromised population.

## 2021-08-25 NOTE — Progress Notes (Signed)
Hematology/Oncology Consult note [facility]  [phone] [phone]  Patient Care Team: Steele Sizer, MD as PCP - General (Family Medicine) Neldon Labella, RN as Case Manager Clent Jacks, RN as Oncology Nurse Navigator [place], [place], LCSW as Social Worker Germaine Pomfret, [facility] (Pharmacist)   Name of the patient: Janice Short  [id number]  Apr 12, 1961   Date of visit: 08/25/21  Diagnosis-metastatic intrahepatic cholangiocarcinoma  Chief complaint/ Reason for visit-on treatment assessment prior to cycle 5-day 1 of gemcitabine cisplatin chemotherapy  Heme/Onc history: Patient is a 60 year old African-American female with a past medical history significant for hypertension hyperlipidemia and type 2 diabetes who presented to the ER with symptoms of cramping abdominal pain and nausea.  This was followed by right upper quadrant ultrasound which showed multiple echogenic masses in the liver with nodular contour of the liver possibly suggestive of cirrhosis.  Several nodular masses in the vicinity of the pancreatic head and porta hepatic lymph nodes.  This was followed by an MRI abdomen and MRCP.  It showed right hepatic lobe masslike area measuring 4.9 x 2.9 cm.  Diffusion abnormality in the left hepatic lobe with mild left-sided biliary ductal dilatation and another area of abnormality measuring 1.4 x 1 cm.  Bulky celiac adenopathy 3 x 2.1 cm tracking into gastrohepatic recess.  Suspected portal venous invasion with convex protrusion in the right portal vein.  Left adrenal lesion measuring 2.9 x 1.5 cm.  Findings concerning for cholangiocarcinoma or biphenotypic cholangiole hepatocellular neoplasm.     Patient underwent CT-guided liver biopsy Which was consistent with adenocarcinoma poorly differentiated.  Immunohistochemistry showed CK7 positivity, CDX2 negative, Heppar negative, GATA3 negative.  CK19 positive.  Differentials include  cholangiocarcinoma, pancreatic, upper GI.  Lung and breast less likely.  However given CK19 positivity most compatible with cholangiocarcinoma.   Patient's case was discussed at tumor board and it was felt as if the lymphadenopathy was more like a confluent primary tumor mass.  Patient was referred to [facility] for second opinion to see if she would be a candidate for surgery.However upon their review of the PET scan it was deemed that this was indeed periportal adenopathy.  They also did a CT chest abdomen and pelvis with MIPS protocol and she was also found to have portocaval lymph node measuring 2.4 cm which was more consistent with metastatic disease.  She was deemed unresectable   NGS testing showed BRCA2, FGFR2, PIK3CA, RAD 54L, CDC73, CDKN2 A/B CR EBBP rearrangement exon 31     Interval history-reports doing well overall.  Symptoms of nasal congestion and sinus pain and pressure have resolved.  Denies any fever cough shortness of breath or abdominal pain.  ECOG PS- 1 Pain scale- 0   Review of systems- Review of Systems  Constitutional:  Negative for chills, fever, malaise/fatigue and weight loss.  HENT:  Negative for congestion, ear discharge and nosebleeds.   Eyes:  Negative for blurred vision.  Respiratory:  Negative for cough, hemoptysis, sputum production, shortness of breath and wheezing.   Cardiovascular:  Negative for chest pain, palpitations, orthopnea and claudication.  Gastrointestinal:  Negative for abdominal pain, blood in stool, constipation, diarrhea, heartburn, melena, nausea and vomiting.  Genitourinary:  Negative for dysuria, flank pain, frequency, hematuria and urgency.  Musculoskeletal:  Negative for back pain, joint pain and myalgias.  Skin:  Negative for rash.  Neurological:  Negative for dizziness, tingling, focal weakness, seizures, weakness and headaches.  Endo/Heme/Allergies:  Does not bruise/bleed easily.  Psychiatric/Behavioral:  Negative for depression and  suicidal ideas. The patient does not have insomnia.       Allergies  Allergen Reactions   Other Shortness Of Breath    Cat dander and grass pollen   Bydureon [Exenatide] Other (See Comments)    Pain with injection not allergy     Past Medical History:  Diagnosis Date   Allergy    Asthma    Carpal tunnel syndrome on left    Cervical radiculitis    Chronic osteoarthritis    Corns and callosity    Depression    Dermatophytosis of foot    Diabetes mellitus without complication (HCC)    Gout    Hyperlipidemia    Hypertension    Impingement syndrome of left shoulder    Intrahepatic cholangiocarcinoma (HCC)    Lumbosacral neuritis    Microalbuminuria    Obesity    Supraventricular tachycardia ([place]) 07/01/2015   SVT (supraventricular tachycardia) (HCC)    Tenosynovitis of wrist    Vitamin D deficiency      Past Surgical History:  Procedure Laterality Date   ABDOMINAL HYSTERECTOMY     COLONOSCOPY WITH PROPOFOL N/A 05/06/2021   Procedure: COLONOSCOPY WITH PROPOFOL;  Surgeon: Lucilla Lame, MD;  Location: [facility] ENDOSCOPY;  Service: Endoscopy;  Laterality: N/A;   ESOPHAGOGASTRODUODENOSCOPY (EGD) WITH PROPOFOL N/A 05/06/2021   Procedure: ESOPHAGOGASTRODUODENOSCOPY (EGD) WITH PROPOFOL;  Surgeon: Lucilla Lame, MD;  Location: [facility] ENDOSCOPY;  Service: Endoscopy;  Laterality: N/A;   PORTA CATH INSERTION N/A 07/17/2021   Procedure: PORTA CATH INSERTION;  Surgeon: Algernon Huxley, MD;  Location: [place] [facility];  Service: Cardiovascular;  Laterality: N/A;    Social History   Socioeconomic History   Marital status: Single    Spouse name: Not on file   Number of children: 2   Years of education: Not on file   Highest education level: Not on file  Occupational History   Occupation: disable     Comment: from chronic back pain, 2019  Tobacco Use   Smoking status: Some Days    Packs/day: 0.25    Types: Cigarettes   Smokeless tobacco: Never   Tobacco comments:    2 per day  01/30/21  Vaping Use   Vaping Use: Never used  Substance and Sexual Activity   Alcohol use: Yes    Alcohol/week: 0.0 standard drinks    Comment: rare maybe 1-2  year   Drug use: No   Sexual activity: Yes  Other Topics Concern   Not on file  Social History Narrative   She is on disability for chronic back pain. Pt's son lives with her   Social Determinants of Health   Financial Resource Strain: Medium Risk   Difficulty of Paying Living Expenses: Somewhat hard  Food Insecurity: No Food Insecurity   Worried About Charity fundraiser in the Last Year: Never true   Arboriculturist in the Last Year: Never true  Transportation Needs: No Transportation Needs   Lack of Transportation (Medical): No   Lack of Transportation (Non-Medical): No  Physical Activity: Inactive   Days of Exercise per Week: 0 days   Minutes of Exercise per Session: 0 min  Stress: Stress Concern Present   Feeling of Stress : To some extent  Social Connections: Moderately Isolated   Frequency of Communication with Friends and Family: More than three times a week   Frequency of Social Gatherings with Friends and Family: Three times a week  Attends Religious Services: More than 4 times per year   Active Member of Clubs or Organizations: No   Attends Archivist Meetings: Never   Marital Status: Never married  Human resources officer Violence: Not At Risk   Fear of Current or Ex-Partner: No   Emotionally Abused: No   Physically Abused: No   Sexually Abused: No    Family History  Problem Relation Age of Onset   Heart attack Mother    CAD Mother    Kidney disease Father      Current Outpatient Medications:    albuterol (VENTOLIN HFA) 108 (90 Base) MCG/ACT inhaler, INHALE 2 PUFFS BY MOUTH EVERY 6 HOURS AS NEEDED FOR WHEEZING AND FOR SHORTNESS OF BREATH, Disp: 18 g, Rfl: 1   aspirin 81 MG tablet, Take 1 tablet by mouth daily., Disp: , Rfl:    atorvastatin (LIPITOR) 40 MG tablet, Take 1 tablet (40 mg total)  by mouth at bedtime., Disp: 90 tablet, Rfl: 1   blood glucose meter kit and supplies, Dispense based on patient and insurance preference. Use up to four times daily as directed. (FOR ICD-10 E10.9, E11.9)., Disp: 1 each, Rfl: 0   diphenhydramine-acetaminophen (TYLENOL PM) 25-500 MG TABS tablet, Take 1 tablet by mouth at bedtime as needed., Disp: , Rfl:    Empagliflozin-metFORMIN HCl ER (SYNJARDY XR) 12.04-999 MG TB24, Take 2 tablets by mouth daily. New dose, Disp: 180 tablet, Rfl: 1   escitalopram (LEXAPRO) 10 MG tablet, Take 1 tablet by mouth once daily, Disp: 30 tablet, Rfl: 0   fluticasone (FLONASE) 50 MCG/ACT nasal spray, USE 2 SPRAY(S) IN EACH NOSTRIL AT BEDTIME (Patient taking differently: Place 2 sprays into both nostrils daily as needed. USE 2 SPRAY(S) IN EACH NOSTRIL AT BEDTIME), Disp: 16 g, Rfl: 0   Fluticasone-Umeclidin-Vilant (TRELEGY ELLIPTA) 100-62.5-25 MCG/INH AEPB, Take 1 puff by mouth daily., Disp: 180 each, Rfl: 1   gabapentin (NEURONTIN) 300 MG capsule, Take 2 capsules (600 mg total) by mouth 2 (two) times daily., Disp: 360 capsule, Rfl: 1   Insulin Degludec (TRESIBA) 100 UNIT/ML SOLN, Inject 12-30 units of lipase into the skin daily. To keep fasting glucose between 100-140, Disp: 15 mL, Rfl: 0   Insulin Pen Needle 31G X 8 MM MISC, 1 each by Does not apply route daily., Disp: 100 each, Rfl: 1   losartan (COZAAR) 25 MG tablet, Take 1 tablet (25 mg total) by mouth daily., Disp: 90 tablet, Rfl: 0   ondansetron (ZOFRAN) 8 MG tablet, Take 1 tablet (8 mg total) by mouth 2 (two) times daily as needed. Start on the third day after cisplatin chemotherapy., Disp: 30 tablet, Rfl: 1   prochlorperazine (COMPAZINE) 10 MG tablet, Take 1 tablet (10 mg total) by mouth every 6 (six) hours as needed (Nausea or vomiting)., Disp: 30 tablet, Rfl: 1   dexamethasone (DECADRON) 4 MG tablet, Take 2 tablets (8 mg total) by mouth daily. Take daily x 3 days starting the day after cisplatin chemotherapy. Take with  food. (Patient not taking: No sig reported), Disp: 30 tablet, Rfl: 1   lidocaine-prilocaine (EMLA) cream, Apply to affected area once (Patient not taking: No sig reported), Disp: 30 g, Rfl: 3 No current facility-administered medications for this visit.  Facility-Administered Medications Ordered in Other Visits:    CISplatin (PLATINOL) 45 mg in sodium chloride 0.9 % 250 mL chemo infusion, 25 mg/m2 (Treatment Plan Recorded), Intravenous, Once, Sindy Guadeloupe, MD   dexamethasone (DECADRON) 10 mg in sodium chloride 0.9 % 50  mL IVPB, 10 mg, Intravenous, Once, Sindy Guadeloupe, MD   fosaprepitant (EMEND) 150 mg in sodium chloride 0.9 % 145 mL IVPB, 150 mg, Intravenous, Once, Sindy Guadeloupe, MD   gemcitabine (GEMZAR) 1,400 mg in sodium chloride 0.9 % 250 mL chemo infusion, 1,400 mg, Intravenous, Once, Sindy Guadeloupe, MD   heparin lock flush 100 unit/mL, 500 Units, Intracatheter, Once PRN, Sindy Guadeloupe, MD   magnesium sulfate IVPB 2 g 50 mL, 2 g, Intravenous, Once, Sindy Guadeloupe, MD, Last Rate: 50 mL/hr at 08/25/21 0923, 2 g at 08/25/21 0923   palonosetron (ALOXI) injection 0.25 mg, 0.25 mg, Intravenous, Once, Sindy Guadeloupe, MD  Physical exam:  Vitals:   08/25/21 0836  BP: 131/63  Pulse: 87  Resp: 16  Temp: (!) 96.9 F (36.1 C)  SpO2: 100%  Weight: 142 lb 11.2 oz (64.7 kg)   Physical Exam Constitutional:      General: She is not in acute distress. Cardiovascular:     Rate and Rhythm: Normal rate and regular rhythm.     Heart sounds: Normal heart sounds.  Pulmonary:     Effort: Pulmonary effort is normal.     Breath sounds: Normal breath sounds.  Abdominal:     General: Bowel sounds are normal.     Palpations: Abdomen is soft.  Skin:    General: Skin is warm and dry.  Neurological:     Mental Status: She is alert and oriented to person, place, and time.     CMP Latest Ref Rng & Units 08/25/2021  Glucose 70 - 99 mg/dL 231(H)  BUN 8 - 23 mg/dL 15  Creatinine 0.44 - 1.00 mg/dL  1.10(H)  Sodium 135 - 145 mmol/L 134(L)  Potassium 3.5 - 5.1 mmol/L 4.3  Chloride 98 - 111 mmol/L 107  CO2 22 - 32 mmol/L 19(L)  Calcium 8.9 - 10.3 mg/dL 8.4(L)  Total Protein 6.5 - 8.1 g/dL 7.8  Total Bilirubin 0.3 - 1.2 mg/dL 1.1  Alkaline Phos 38 - 126 U/L 541(H)  AST 15 - 41 U/L 43(H)  ALT 0 - 44 U/L 23   CBC Latest Ref Rng & Units 08/25/2021  WBC 4.0 - 10.5 K/uL 6.5  Hemoglobin 12.0 - 15.0 g/dL 9.6(L)  Hematocrit 36.0 - 46.0 % 30.7(L)  Platelets 150 - 400 K/uL 202    No images are attached to the encounter.  CT CHEST ABDOMEN PELVIS W CONTRAST  Result Date: 08/12/2021 CLINICAL DATA:  Intrahepatic cholangiocarcinoma restaging, chemotherapy, treatment response EXAM: CT CHEST, ABDOMEN, AND PELVIS WITH CONTRAST TECHNIQUE: Multidetector CT imaging of the chest, abdomen and pelvis was performed following the standard protocol during bolus administration of intravenous contrast. CONTRAST:  78m OMNIPAQUE IOHEXOL 350 MG/ML SOLN, additional oral enteric contrast COMPARISON:  PET-CT, 04/16/2021, MR abdomen, 03/24/2021 FINDINGS: CT CHEST FINDINGS Cardiovascular: Right chest port catheter. Aortic atherosclerosis. Normal heart size. Left and right coronary artery calcifications. No pericardial effusion. Mediastinum/Nodes: No enlarged mediastinal, hilar, or axillary lymph nodes. Thyroid gland, trachea, and esophagus demonstrate no significant findings. Lungs/Pleura: Background of very fine ground-glass nodules, most concentrated in the lung apices. No pleural effusion or pneumothorax. Musculoskeletal: No chest wall mass or suspicious bone lesions identified. CT ABDOMEN PELVIS FINDINGS Hepatobiliary: Within the limitations of cross modality comparison, unchanged post treatment appearance of the right lobe of the liver and no significant interval change in a hypoenhancing lesion of the inferior right lobe of the liver, hepatic segment VI, measuring 4.9 x 4.6 cm (series 2, image  56). No significant change in  multiple hypodense and heterogeneously enhancing satellite lesions scattered throughout the liver, particularly in the dome (series 2, image 45). The right portal vein remains occluded (series 2, image 56). Tiny gallstones and/or sludge in the dependent gallbladder (series 2, image 67). Unchanged segmental biliary ductal dilatation throughout the right lobe of the liver (series 2, image 53). Pancreas: Unremarkable. No pancreatic ductal dilatation or surrounding inflammatory changes. Spleen: Normal in size without significant abnormality. Adrenals/Urinary Tract: Redemonstrated left adrenal nodule, previously not FDG avid and most consistent with a benign adenoma (series 2, image 57). Kidneys are normal, without renal calculi, solid lesion, or hydronephrosis. Bladder is unremarkable. Stomach/Bowel: Stomach is within normal limits. Appendix appears normal. No evidence of bowel wall thickening, distention, or inflammatory changes. Vascular/Lymphatic: Aortic atherosclerosis. Enlarged, matted appearing portacaval lymph nodes are somewhat reduced in size, difficult to discretely appreciate on current examination, largest index node seen on prior examination measuring no greater than 2.3 x 1.5 cm, previously 3.1 x 2.2 cm (series 2, image 57). Reproductive: Status post hysterectomy. Other: No abdominal wall hernia or abnormality. No abdominopelvic ascites. Musculoskeletal: No acute or significant osseous findings. IMPRESSION: 1. Within the limitations of cross modality comparison, unchanged post treatment appearance of the right lobe of the liver and no significant interval change in a hypoenhancing lesion of the inferior right lobe of the liver, hepatic segment VI, measuring 4.9 x 4.6 cm. No significant change in multiple hypodense and heterogeneously enhancing satellite lesions scattered throughout the liver, particularly in the dome. Unchanged occlusion of the right portal vein and segmental biliary ductal dilatation  throughout the right lobe of the liver. 2. Enlarged, matted appearing portacaval lymph nodes are somewhat reduced in size, however difficult to discretely appreciate on current examination. 3. Findings are consistent with treatment response of nodal metastatic disease with stable primary hepatic disease. 4. No evidence of distant metastatic disease in the chest. 5. Background of very fine ground-glass nodules, most concentrated in the lung apices, most commonly seen in smoking-related respiratory bronchiolitis, metastatic disease not favored. 6. Cholelithiasis. 7. Coronary artery disease. Aortic Atherosclerosis (ICD10-I70.0). Electronically Signed   By: Eddie Candle M.D.   On: 08/12/2021 14:11     Assessment and plan- Patient is a 60 y.o. female with stage IV intrahepatic cholangiocarcinoma here for on treatment assessment prior to cycle 5-day 1 of gemcitabine cisplatin chemotherapy  I have reviewed CT chest abdomen pelvis images independently and discussed findings with the patient.It overall shows that her primary liver mass has not changed in size much and measures 4.9 x 4.6 cm.  Multiple hypodense lesions in the liver are likewise stable.  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  Mild decrease in the size of portacaval lymph nodes.  Plan is therefore to continue gemcitabine cisplatin chemotherapy until progression or toxicity.  Overall there has been a steady decrease in the alkaline phosphatase as well as AST ALT and total bilirubin since the start of treatment indicating good response to treatment as well.  She does have some chemo induced anemia which we will continue to monitor.  Counts are otherwise okay to proceed with gemcitabine cisplatin chemotherapy today and in 1 week.  She will receive on for Neulasta in 1 week.  She will be seen by covering NP in 3 weeks with port labs for cycle 6-day 1 of gemcitabine chemotherapy.  She has been getting chemotherapy 2 weeks on and 1 week off    Visit Diagnosis 1. Encounter for  antineoplastic chemotherapy   2. Intrahepatic cholangiocarcinoma ([place])      Dr. Randa Evens, MD, MPH [facility] [phone] 08/25/2021 9:27 AM

## 2021-08-28 ENCOUNTER — Telehealth: Payer: Medicare HMO

## 2021-08-28 NOTE — Progress Notes (Deleted)
Chronic Care Management Pharmacy Note  08/28/2021 Name:  Janice Short MRN:  [id number] DOB:  February 02, 1961  Summary: ***  Recommendations/Changes made from today's visit: ***  Plan: ***   Subjective: Janice Short is an 60 y.o. year old female who is a primary patient of Steele Sizer, MD.  The CCM team was consulted for assistance with disease management and care coordination needs.    Engaged with patient by telephone for initial visit in response to provider referral for pharmacy case management and/or care coordination services.   Consent to Services:  The patient was given the following information about Chronic Care Management services today, agreed to services, and gave verbal consent: 1. CCM service includes personalized support from designated clinical staff supervised by the primary care provider, including individualized plan of care and coordination with other care providers 2. 24/7 contact phone numbers for assistance for urgent and routine care needs. 3. Service will only be billed when office clinical staff spend 20 minutes or more in a month to coordinate care. 4. Only one practitioner may furnish and bill the service in a calendar month. 5.The patient may stop CCM services at any time (effective at the end of the month) by phone call to the office staff. 6. The patient will be responsible for cost sharing (co-pay) of up to 20% of the service fee (after annual deductible is met). Patient agreed to services and consent obtained.  Patient Care Team: Steele Sizer, MD as PCP - General (Family Medicine) Neldon Labella, RN as Case Manager Clent Jacks, RN as Oncology Nurse Navigator [place], [place], [place] as Social Worker Germaine Pomfret, [facility] (Pharmacist)  Recent office visits: 04/29/21: Patient presented to Dr. Ancil Boozer for follow-up. A1c decreased to 4.8%. Victoza stopped, HCTZ stopped., Escitalopram 10 mg daily.   Recent consult visits: 07/28/2021 Dr. Janese Banks MD  (Oncology) No Medication Changes noted 07/07/2021 Dr. Janese Banks MD (Oncology)No Medication Changes noted 06/16/2021 Dr. Janese Banks MD (Oncology) No Medication Changes noted 06/13/2021 Altha Harm NP (Oncology) No Medication Changes noted 05/26/2021 Dr. Janese Banks MD (Oncology) Patient agreed to starting Chemo 05/15/2021 Altha Harm NP (Oncology) Follow-up 1 month  Hospital visits: Medication Reconciliation was completed by comparing discharge summary, patient's EMR and Pharmacy list, and upon discussion with patient.   Admitted to the hospital on 07/17/2021  due to Intrahepatic bile duct carcinoma. Discharge date was 07/17/2021. Discharged from [place]?Medications Started at [facility] Discharge:?? -started None   Medication Changes at Hospital Discharge: -Changed None   Medications Discontinued at Hospital Discharge: -Stopped None   Medications that remain the same after Hospital Discharge:??  -All other medications will remain the same.    Objective:  Lab Results  Component Value Date   CREATININE 1.10 (H) 08/25/2021   BUN 15 08/25/2021   GFRNONAA 57 (L) 08/25/2021   GFRAA 64 12/26/2020   NA 134 (L) 08/25/2021   K 4.3 08/25/2021   CALCIUM 8.4 (L) 08/25/2021   CO2 19 (L) 08/25/2021   GLUCOSE 231 (H) 08/25/2021    Lab Results  Component Value Date/Time   HGBA1C 4.8 04/29/2021 03:08 PM   HGBA1C 8.1 (A) 12/26/2020 03:56 PM   HGBA1C 6.0 (H) 02/16/2020 08:15 AM   HGBA1C 8.2 (A) 09/01/2019 11:04 AM   HGBA1C 9.2 (H) 02/08/2019 12:00 AM   MICROALBUR 1.0 12/26/2020 04:25 PM   MICROALBUR 1.1 09/01/2019 12:00 AM   MICROALBUR 20 12/17/2017 02:05 PM   MICROALBUR 100 11/19/2015 04:44 PM  Last diabetic Eye exam: No results found for: HMDIABEYEEXA  Last diabetic Foot exam: No results found for: HMDIABFOOTEX   Lab Results  Component Value Date   CHOL 197 12/26/2020   HDL 36 (L) 12/26/2020   LDLCALC 129 (H) 12/26/2020   TRIG 181 (H) 12/26/2020   CHOLHDL 5.5  (H) 12/26/2020    Hepatic Function Latest Ref Rng & Units 08/25/2021 08/04/2021 07/28/2021  Total Protein 6.5 - 8.1 g/dL 7.8 7.3 7.3  Albumin 3.5 - 5.0 g/dL 2.8(L) 2.4(L) 2.4(L)  AST 15 - 41 U/L 43(H) 43(H) 40  ALT 0 - 44 U/L _0 Alk Phosphatase 38 - 126 U/L 541(H) 578(H) 751(H)  Total Bilirubin 0.3 - 1.2 mg/dL 1.1 1.2 1.4(H)  Bilirubin, Direct 0.0 - 0.2 mg/dL - - -    Lab Results  Component Value Date/Time   TSH 2.558 04/21/2021 08:42 AM   TSH 3.09 12/26/2020 04:25 PM    CBC Latest Ref Rng & Units 08/25/2021 08/04/2021 07/28/2021  WBC 4.0 - 10.5 K/uL 6.5 35.7(H) 50.2(HH)  Hemoglobin 12.0 - 15.0 g/dL 9.6(L) 8.0(L) 7.7(L)  Hematocrit 36.0 - 46.0 % 30.7(L) 26.0(L) 24.6(L)  Platelets 150 - 400 K/uL 202 324 239    Lab Results  Component Value Date/Time   VD25OH 11 (L) 03/24/2018 11:42 AM    Clinical ASCVD: {YES/NO:21197} The 10-year ASCVD risk score (Arnett DK, et al., 2019) is: 38.5%   Values used to calculate the score:     Age: 61 years     Sex: Female     Is Non-Hispanic African American: Yes     Diabetic: Yes     Tobacco smoker: Yes     Systolic Blood Pressure: 450 mmHg     Is BP treated: Yes     HDL Cholesterol: 36 mg/dL     Total Cholesterol: 197 mg/dL    Depression screen [facility] 2/9 05/02/2021 04/29/2021 01/30/2021  Decreased Interest 1 1 0  Down, Depressed, Hopeless 1 1 0  PHQ - 2 Score 2 2 0  Altered sleeping 0 0 -  Tired, decreased energy 0 0 -  Change in appetite 1 1 -  Feeling bad or failure about yourself  0 0 -  Trouble concentrating 0 0 -  Moving slowly or fidgety/restless 0 0 -  Suicidal thoughts 0 0 -  PHQ-9 Score 3 3 -  Difficult doing work/chores - Not difficult at all -  Some recent data might be hidden     ***Other: (CHADS2VASc if Afib, MMRC or CAT for COPD, ACT, DEXA)  Social History   Tobacco Use  Smoking Status Some Days   Packs/day: 0.25   Types: Cigarettes  Smokeless Tobacco Never  Tobacco Comments   2 per day 01/30/21   BP  Readings from Last 3 Encounters:  08/25/21 131/63  08/04/21 129/75  07/28/21 124/73   Pulse Readings from Last 3 Encounters:  08/25/21 87  08/04/21 87  07/28/21 88   Wt Readings from Last 3 Encounters:  08/25/21 142 lb 11.2 oz (64.7 kg)  08/04/21 152 lb (68.9 kg)  07/28/21 150 lb (68 kg)   BMI Readings from Last 3 Encounters:  08/25/21 22.35 kg/m  08/04/21 23.81 kg/m  07/28/21 23.49 kg/m    Assessment/Interventions: Review of patient past medical history, allergies, medications, health status, including review of consultants reports, laboratory and other test data, was performed as part of comprehensive evaluation and provision of chronic care management services.   SDOH:  (Social Determinants of  Health) assessments and interventions performed: {yes/no:20286}  SDOH Screenings   Alcohol Screen: Low Risk    Last Alcohol Screening Score (AUDIT): 0  Depression (PHQ2-9): Low Risk    PHQ-2 Score: 3  Financial Resource Strain: Medium Risk   Difficulty of Paying Living Expenses: Somewhat hard  Food Insecurity: No Food Insecurity   Worried About Charity fundraiser in the Last Year: Never true   Ran Out of Food in the Last Year: Never true  Housing: Low Risk    Last Housing Risk Score: 0  Physical Activity: Inactive   Days of Exercise per Week: 0 days   Minutes of Exercise per Session: 0 min  Social Connections: Moderately Isolated   Frequency of Communication with Friends and Family: More than three times a week   Frequency of Social Gatherings with Friends and Family: Three times a week   Attends Religious Services: More than 4 times per year   Active Member of Clubs or Organizations: No   Attends Archivist Meetings: Never   Marital Status: Never married  Stress: Stress Concern Present   Feeling of Stress : To some extent  Tobacco Use: High Risk   Smoking Tobacco Use: Some Days   Smokeless Tobacco Use: Never  Transportation Needs: No Transportation Needs    Lack of Transportation (Medical): No   Lack of Transportation (Non-Medical): No    CCM Care Plan  Allergies  Allergen Reactions   Other Shortness Of Breath    Cat dander and grass pollen   Bydureon [Exenatide] Other (See Comments)    Pain with injection not allergy    Medications Reviewed Today     Reviewed by Sindy Guadeloupe, MD (Physician) on 08/25/21 at 0927  Med List Status: <None>   Medication Order Taking? Sig Documenting Provider Last Dose Status Informant  albuterol (VENTOLIN HFA) 108 (90 Base) MCG/ACT inhaler [id number] Yes INHALE 2 PUFFS BY MOUTH EVERY 6 HOURS AS NEEDED FOR WHEEZING AND FOR SHORTNESS OF Cherlynn June, Drue Stager, MD Taking Active   aspirin 81 MG tablet [id number] Yes Take 1 tablet by mouth daily. [provider] Taking Active            Med Note Clemetine Marker D   Tue Jan 30, 2020 10:46 AM)    atorvastatin (LIPITOR) 40 MG tablet [id number] Yes Take 1 tablet (40 mg total) by mouth at bedtime. Steele Sizer, MD Taking Active   blood glucose meter kit and supplies [id number] Yes Dispense based on patient and insurance preference. Use up to four times daily as directed. (FOR ICD-10 E10.9, E11.9). Steele Sizer, MD Taking Active   CISplatin (PLATINOL) 45 mg in sodium chloride 0.9 % 250 mL chemo infusion [id number]   Sindy Guadeloupe, MD  Active   dexamethasone (DECADRON) 10 mg in sodium chloride 0.9 % 50 mL IVPB [id number]   Sindy Guadeloupe, MD  Active   dexamethasone (DECADRON) 4 MG tablet [id number] No Take 2 tablets (8 mg total) by mouth daily. Take daily x 3 days starting the day after cisplatin chemotherapy. Take with food.  Patient not taking: No sig reported   Sindy Guadeloupe, MD Not Taking Active   diphenhydramine-acetaminophen (TYLENOL PM) 25-500 MG TABS tablet [id number] Yes Take 1 tablet by mouth at bedtime as needed. [provider] Taking Active   Empagliflozin-metFORMIN HCl ER (SYNJARDY XR) 12.04-999 MG TB24 [id number] Yes Take 2 tablets by mouth  daily. New dose Steele Sizer, MD Taking Active   escitalopram [facility])  10 MG tablet [id number] Yes Take 1 tablet by mouth once daily Steele Sizer, MD Taking Active   fluticasone (FLONASE) 50 MCG/ACT nasal spray [id number] Yes USE 2 SPRAY(S) IN EACH NOSTRIL AT BEDTIME  Patient taking differently: Place 2 sprays into both nostrils daily as needed. USE 2 SPRAY(S) IN EACH NOSTRIL AT BEDTIME   Steele Sizer, MD Taking Active   Fluticasone-Umeclidin[facility] ELLIPTA) 100-62.5-25 MCG/INH AEPB [id number] Yes Take 1 puff by mouth daily. Steele Sizer, MD Taking Active   fosaprepitant (EMEND) 150 mg in sodium chloride 0.9 % 145 mL IVPB [id number]   Sindy Guadeloupe, MD  Active   gabapentin (NEURONTIN) 300 MG capsule [id number] Yes Take 2 capsules (600 mg total) by mouth 2 (two) times daily. Steele Sizer, MD Taking Active   gemcitabine (GEMZAR) 1,400 mg in sodium chloride 0.9 % 250 mL chemo infusion [id number]   Sindy Guadeloupe, MD  Active   heparin lock flush 100 unit/mL [id number]   Sindy Guadeloupe, MD  Active   Insulin Degludec (TRESIBA) 100 UNIT/ML SOLN [id number] Yes Inject 12-30 units of lipase into the skin daily. To keep fasting glucose between 100-140 Steele Sizer, MD Taking Active   Insulin Pen Needle 31G X 8 MM MISC [id number] Yes 1 each by Does not apply route daily. Steele Sizer, MD Taking Active   lidocaine-prilocaine (EMLA) cream [id number] No Apply to affected area once  Patient not taking: No sig reported   Sindy Guadeloupe, MD Not Taking Active   losartan (COZAAR) 25 MG tablet [id number] Yes Take 1 tablet (25 mg total) by mouth daily. Steele Sizer, MD Taking Active   magnesium sulfate IVPB 2 g 50 mL [id number]   Sindy Guadeloupe, MD  Active   ondansetron [facility]) 8 MG tablet [id number] Yes Take 1 tablet (8 mg total) by mouth 2 (two) times daily as needed. Start on the third day after cisplatin chemotherapy. Sindy Guadeloupe, MD Taking Active   palonosetron Leona Carry) injection 0.25 mg  [id number]   Sindy Guadeloupe, MD  Active   prochlorperazine (COMPAZINE) 10 MG tablet [id number] Yes Take 1 tablet (10 mg total) by mouth every 6 (six) hours as needed (Nausea or vomiting). Sindy Guadeloupe, MD Taking Active             Patient Active Problem List   Diagnosis Date Noted   Screen for colon cancer    Rectal polyp    Polyp of sigmoid colon    Abnormal findings on diagnostic imaging of digestive system    Gastritis with hemorrhage    Polyp of duodenum    Intrahepatic cholangiocarcinoma ([place]) 04/12/2021   Goals of care, counseling/discussion 04/12/2021   Mild episode of recurrent major depressive disorder ([place]) 02/08/2019   Dyslipidemia associated with type 2 diabetes mellitus ([place]) 12/17/2017   Noncompliance with medication regimen 12/17/2017   Spinal stenosis 10/15/2016   Diabetic polyneuropathy associated with type 2 diabetes mellitus ([place]) 07/14/2016   Chronic midline low back pain with sciatica 06/22/2016   Primary osteoarthritis of both knees 06/22/2016   Carpal tunnel syndrome 07/01/2015   Cervical radiculitis 07/01/2015   Corn or callus 07/01/2015   Impingement syndrome of shoulder 07/01/2015   Microalbuminuria 07/01/2015   Compulsive tobacco user syndrome 07/01/2015   Tenosynovitis of wrist 07/01/2015   Benign hypertension 11/05/2009   Uncontrolled type 2 diabetes mellitus with microalbuminuria ([place]) 11/05/2009   Athlete's foot 08/05/2009   Combined fat and carbohydrate induced hyperlipemia 01/08/2009   Vitamin D deficiency 01/08/2009  Asthma, mild intermittent 12/28/2008   Allergic rhinitis 12/28/2008   Lumbosacral neuritis 08/11/2007    Immunization History  Administered Date(s) Administered   Influenza,inj,Quad PF,6+ Mos 08/14/2013, 08/02/2014, 11/19/2015, 10/15/2016, 12/26/2020   Influenza-Unspecified 08/02/2014   PPD Test 07/01/2015, 09/09/2016   Pneumococcal Conjugate-13 04/20/2018   Pneumococcal Polysaccharide-23 02/08/2019   Td 05/08/2010    Tdap 05/08/2010    Conditions to be addressed/monitored:  Hypertension, Hyperlipidemia, Diabetes, Asthma, and Depression  There are no care plans that you recently modified to display for this patient.    Medication Assistance: {MEDASSISTANCEINFO:25044}  Compliance/Adherence/Medication fill history: Care Gaps: ***  Star-Rating Drugs: ***  Patient's preferred pharmacy is:  [facility] [address] [place] [address] [place] [address] Phone: [phone] Fax: [phone]  Uses pill box? {Yes or If no, why not?:20788} Pt endorses ***% compliance  We discussed: {Pharmacy options:24294} Patient decided to: {US Pharmacy Plan:23885}  Care Plan and Follow Up Patient Decision:  {FOLLOWUP:24991}  Plan: {CM FOLLOW UP ZBMZ:58682}  ***  Current Barriers:  {pharmacybarriers:24917}  Pharmacist Clinical Goal(s):  Patient will {PHARMACYGOALCHOICES:24921} through collaboration with PharmD and provider.   Interventions: 1:1 collaboration with Steele Sizer, MD regarding development and update of comprehensive plan of care as evidenced by provider attestation and co-signature Inter-disciplinary care team collaboration (see longitudinal plan of care) Comprehensive medication review performed; medication list updated in electronic medical record  Hypertension (BP goal {CHL HP UPSTREAM Pharmacist BP ranges:[phone]}) -{US controlled/uncontrolled:25276} -Current treatment: Losartan 25 mg daily  -Medications previously tried: ***  -Current home readings: *** -Current dietary habits: *** -Current exercise habits: *** -{ACTIONS;DENIES/REPORTS:21021675::"Denies"} hypotensive/hypertensive symptoms -Educated on {CCM BP Counseling:25124} -Counseled to monitor BP at home ***, document, and provide log at future appointments -{CCMPHARMDINTERVENTION:25122}  Hyperlipidemia: (LDL goal < ***) -{US controlled/uncontrolled:25276} -Current  treatment: Atorvastatin 40 mg nightly  -Medications previously tried: ***  -Current dietary patterns: *** -Current exercise habits: *** -Educated on {CCM HLD Counseling:25126} -{CCMPHARMDINTERVENTION:25122}  Diabetes (A1c goal {A1c goals:23924}) -{US controlled/uncontrolled:25276} -Current medications: Synjardy XR 12.04-999 mg 2 tablets daily  Tresiba 12-30 units daily  -Medications previously tried: ***  -Current home glucose readings fasting glucose: *** post prandial glucose: *** -{ACTIONS;DENIES/REPORTS:21021675::"Denies"} hypoglycemic/hyperglycemic symptoms -Current meal patterns:  breakfast: ***  lunch: ***  dinner: *** snacks: *** drinks: *** -Current exercise: *** -Educated on {CCM DM COUNSELING:25123} -Counseled to check feet daily and get yearly eye exams -{CCMPHARMDINTERVENTION:25122}  Asthma (Goal: control symptoms and prevent exacerbations) -{US controlled/uncontrolled:25276} -Current treatment  Trelegy 1 puff daily  Ventolin HFA 2 puffs every 6 hours as needed  Flonase nasal spray  -Medications previously tried: ***  -Gold Grade: {CHL HP Upstream Pharm COPD Gold BRKVT:[id number]} -Current COPD Classification:  {CHL HP Upstream Pharm COPD Classification:6691092604} -MMRC/CAT score: *** -Pulmonary function testing: *** -Exacerbations requiring treatment in last 6 months: *** -Patient {Actions; denies-reports:120008} consistent use of maintenance inhaler -Frequency of rescue inhaler use: *** -Counseled on {CCMINHALERCOUNSELING:25121} -{CCMPHARMDINTERVENTION:25122}  Depression/Anxiety (Goal: ***) -{US controlled/uncontrolled:25276} -Current treatment: Escitalopram 10 mg daily  -Medications previously tried/failed: *** -PHQ9: 3 -GAD7: 1 -Connected with *** for mental health support -Educated on {CCM mental health counseling:25127} -{CCMPHARMDINTERVENTION:25122}  Patient Goals/Self-Care Activities Patient will:  -  {pharmacypatientgoals:24919}  Follow Up Plan: {CM FOLLOW UP BZXY:72897}

## 2021-08-30 ENCOUNTER — Emergency Department: Payer: Medicare HMO

## 2021-08-30 ENCOUNTER — Emergency Department
Admission: EM | Admit: 2021-08-30 | Discharge: 2021-08-30 | Disposition: A | Discharge status: Home / Self Care | Payer: Medicare HMO | Attending: Emergency Medicine | Admitting: Emergency Medicine

## 2021-08-30 DIAGNOSIS — Z794 Long term (current) use of insulin: Secondary | ICD-10-CM | POA: Insufficient documentation

## 2021-08-30 DIAGNOSIS — R112 Nausea with vomiting, unspecified: Secondary | ICD-10-CM | POA: Diagnosis not present

## 2021-08-30 DIAGNOSIS — J3489 Other specified disorders of nose and nasal sinuses: Secondary | ICD-10-CM | POA: Diagnosis not present

## 2021-08-30 DIAGNOSIS — Z7984 Long term (current) use of oral hypoglycemic drugs: Secondary | ICD-10-CM | POA: Diagnosis not present

## 2021-08-30 DIAGNOSIS — F17210 Nicotine dependence, cigarettes, uncomplicated: Secondary | ICD-10-CM | POA: Diagnosis not present

## 2021-08-30 DIAGNOSIS — R059 Cough, unspecified: Secondary | ICD-10-CM | POA: Diagnosis not present

## 2021-08-30 DIAGNOSIS — R0981 Nasal congestion: Secondary | ICD-10-CM | POA: Insufficient documentation

## 2021-08-30 DIAGNOSIS — E1142 Type 2 diabetes mellitus with diabetic polyneuropathy: Secondary | ICD-10-CM | POA: Insufficient documentation

## 2021-08-30 DIAGNOSIS — Z7982 Long term (current) use of aspirin: Secondary | ICD-10-CM | POA: Insufficient documentation

## 2021-08-30 DIAGNOSIS — Z79899 Other long term (current) drug therapy: Secondary | ICD-10-CM | POA: Insufficient documentation

## 2021-08-30 DIAGNOSIS — U071 COVID-19: Secondary | ICD-10-CM | POA: Diagnosis not present

## 2021-08-30 DIAGNOSIS — Z7951 Long term (current) use of inhaled steroids: Secondary | ICD-10-CM | POA: Diagnosis not present

## 2021-08-30 DIAGNOSIS — J45909 Unspecified asthma, uncomplicated: Secondary | ICD-10-CM | POA: Insufficient documentation

## 2021-08-30 DIAGNOSIS — R0989 Other specified symptoms and signs involving the circulatory and respiratory systems: Secondary | ICD-10-CM | POA: Diagnosis not present

## 2021-08-30 DIAGNOSIS — I10 Essential (primary) hypertension: Secondary | ICD-10-CM | POA: Diagnosis not present

## 2021-08-30 DIAGNOSIS — J019 Acute sinusitis, unspecified: Secondary | ICD-10-CM | POA: Diagnosis not present

## 2021-08-30 DIAGNOSIS — J0100 Acute maxillary sinusitis, unspecified: Secondary | ICD-10-CM

## 2021-08-30 DIAGNOSIS — K029 Dental caries, unspecified: Secondary | ICD-10-CM | POA: Diagnosis not present

## 2021-08-30 LAB — RESP PANEL BY RT-PCR (FLU A&B, COVID) ARPGX2
Influenza A by PCR: NEGATIVE
Influenza B by PCR: NEGATIVE
SARS Coronavirus 2 by RT PCR: POSITIVE — AB

## 2021-08-30 LAB — CBC
HCT: 32.5 % — ABNORMAL LOW (ref 36.0–46.0)
Hemoglobin: 10.6 g/dL — ABNORMAL LOW (ref 12.0–15.0)
MCH: 33.8 pg (ref 26.0–34.0)
MCHC: 32.6 g/dL (ref 30.0–36.0)
MCV: 103.5 fL — ABNORMAL HIGH (ref 80.0–100.0)
Platelets: 178 10*3/uL (ref 150–400)
RBC: 3.14 MIL/uL — ABNORMAL LOW (ref 3.87–5.11)
RDW: 16.5 % — ABNORMAL HIGH (ref 11.5–15.5)
WBC: 5.1 10*3/uL (ref 4.0–10.5)
nRBC: 0 % (ref 0.0–0.2)

## 2021-08-30 LAB — COMPREHENSIVE METABOLIC PANEL
ALT: 26 U/L (ref 0–44)
AST: 35 U/L (ref 15–41)
Albumin: 3.1 g/dL — ABNORMAL LOW (ref 3.5–5.0)
Alkaline Phosphatase: 448 U/L — ABNORMAL HIGH (ref 38–126)
Anion gap: 9 (ref 5–15)
BUN: 15 mg/dL (ref 8–23)
CO2: 20 mmol/L — ABNORMAL LOW (ref 22–32)
Calcium: 8.7 mg/dL — ABNORMAL LOW (ref 8.9–10.3)
Chloride: 105 mmol/L (ref 98–111)
Creatinine, Ser: 0.99 mg/dL (ref 0.44–1.00)
GFR, Estimated: >60 mL/min (ref >60)
Glucose, Bld: 215 mg/dL — ABNORMAL HIGH (ref 70–99)
Potassium: 4.6 mmol/L (ref 3.5–5.1)
Sodium: 134 mmol/L — ABNORMAL LOW (ref 135–145)
Total Bilirubin: 1.2 mg/dL (ref 0.3–1.2)
Total Protein: 8.4 g/dL — ABNORMAL HIGH (ref 6.5–8.1)

## 2021-08-30 MED ORDER — ONDANSETRON 4 MG PO TBDP: 4.0000 mg | ORAL_TABLET | Freq: Three times a day (TID) | ORAL | 0 refills | Status: DC | PRN

## 2021-08-30 MED ORDER — SODIUM CHLORIDE 0.9 % IV BOLUS
1000.0000 mL | Freq: Once | INTRAVENOUS | Status: AC
  Administered 2021-08-30: 1000 mL via INTRAVENOUS

## 2021-08-30 MED ORDER — MORPHINE SULFATE (PF) 4 MG/ML IV SOLN
4.0000 mg | Freq: Once | INTRAVENOUS | Status: AC
Start: 2021-08-30 — End: 2021-08-30
  Administered 2021-08-30: 4 mg via INTRAVENOUS
  Filled 2021-08-30: qty 1

## 2021-08-30 MED ORDER — HEPARIN SOD (PORK) LOCK FLUSH 10 UNIT/ML IV SOLN
10.0000 [IU] | Freq: Once | INTRAVENOUS | Status: AC
  Administered 2021-08-30: 10 [IU] via INTRAVENOUS
  Filled 2021-08-30: qty 1

## 2021-08-30 MED ORDER — ONDANSETRON HCL 4 MG/2ML IJ SOLN
4.0000 mg | Freq: Once | INTRAMUSCULAR | Status: AC
  Administered 2021-08-30: 4 mg via INTRAVENOUS
  Filled 2021-08-30: qty 2

## 2021-08-30 MED ORDER — ONDANSETRON 4 MG PO TBDP
4.0000 mg | ORAL_TABLET | Freq: Once | ORAL | Status: AC
  Administered 2021-08-30: 4 mg via ORAL
  Filled 2021-08-30: qty 1

## 2021-08-30 MED ORDER — AMOXICILLIN-POT CLAVULANATE 875-125 MG PO TABS: 1.0000 | ORAL_TABLET | Freq: Two times a day (BID) | ORAL | 0 refills | Status: AC

## 2021-08-30 MED ORDER — HYDROCODONE-ACETAMINOPHEN 5-325 MG PO TABS: 1.0000 | ORAL_TABLET | ORAL | 0 refills | Status: DC | PRN

## 2021-08-30 MED ORDER — HYDROCODONE-ACETAMINOPHEN 5-325 MG PO TABS
1.0000 | ORAL_TABLET | Freq: Once | ORAL | Status: AC
  Administered 2021-08-30: 1 via ORAL
  Filled 2021-08-30: qty 1

## 2021-08-30 NOTE — Discharge Instructions (Addendum)
Please call your doctor and oncologist Monday morning to inform them of today's ER visit and your COVID-positive status.  Please take your medications as prescribed as needed.  Return to the emergency department for any symptoms personally concerning to yourself.

## 2021-08-30 NOTE — ED Provider Notes (Signed)
[facility] Emergency Department Provider Note  Time seen: 8:46 PM  I have reviewed the triage vital signs and the nursing notes.   HISTORY  Chief Complaint Cough and Diarrhea   HPI Janice Short is a 60 y.o. female with a past medical history of depression, hypertension, hyperlipidemia, SVT, stage IV cholangiocarcinoma currently on chemotherapy who presents to the emergency department for 2 weeks of cough congestion sinus pressure and pain as well as nausea and vomiting.  Patient states chills at times but denies any known fever.  States she is currently on chemotherapy and frequently will be nauseated with intermittent vomiting.  Last round of chemotherapy was this past Monday per patient.  Patient states she has been coughing and her major concern is sinus pain and congestion.  Currently describes her sinus pain is a 9/10.   Past Medical History:  Diagnosis Date   Allergy    Asthma    Carpal tunnel syndrome on left    Cervical radiculitis    Chronic osteoarthritis    Corns and callosity    Depression    Dermatophytosis of foot    Diabetes mellitus without complication (HCC)    Gout    Hyperlipidemia    Hypertension    Impingement syndrome of left shoulder    Intrahepatic cholangiocarcinoma ([place])    Lumbosacral neuritis    Microalbuminuria    Obesity    Supraventricular tachycardia ([place]) 07/01/2015   SVT (supraventricular tachycardia) ([place])    Tenosynovitis of wrist    Vitamin D deficiency     Patient Active Problem List   Diagnosis Date Noted   Screen for colon cancer    Rectal polyp    Polyp of sigmoid colon    Abnormal findings on diagnostic imaging of digestive system    Gastritis with hemorrhage    Polyp of duodenum    Intrahepatic cholangiocarcinoma ([place]) 04/12/2021   Goals of care, counseling/discussion 04/12/2021   Mild episode of recurrent major depressive disorder ([place]) 02/08/2019   Dyslipidemia associated with type 2 diabetes  mellitus ([place]) 12/17/2017   Noncompliance with medication regimen 12/17/2017   Spinal stenosis 10/15/2016   Diabetic polyneuropathy associated with type 2 diabetes mellitus ([place]) 07/14/2016   Chronic midline low back pain with sciatica 06/22/2016   Primary osteoarthritis of both knees 06/22/2016   Carpal tunnel syndrome 07/01/2015   Cervical radiculitis 07/01/2015   Corn or callus 07/01/2015   Impingement syndrome of shoulder 07/01/2015   Microalbuminuria 07/01/2015   Compulsive tobacco user syndrome 07/01/2015   Tenosynovitis of wrist 07/01/2015   Benign hypertension 11/05/2009   Uncontrolled type 2 diabetes mellitus with microalbuminuria ([place]) 11/05/2009   Athlete's foot 08/05/2009   Combined fat and carbohydrate induced hyperlipemia 01/08/2009   Vitamin D deficiency 01/08/2009   Asthma, mild intermittent 12/28/2008   Allergic rhinitis 12/28/2008   Lumbosacral neuritis 08/11/2007    Past Surgical History:  Procedure Laterality Date   ABDOMINAL HYSTERECTOMY     COLONOSCOPY WITH PROPOFOL N/A 05/06/2021   Procedure: COLONOSCOPY WITH PROPOFOL;  Surgeon: Lucilla Lame, MD;  Location: [facility] ENDOSCOPY;  Service: Endoscopy;  Laterality: N/A;   ESOPHAGOGASTRODUODENOSCOPY (EGD) WITH PROPOFOL N/A 05/06/2021   Procedure: ESOPHAGOGASTRODUODENOSCOPY (EGD) WITH PROPOFOL;  Surgeon: Lucilla Lame, MD;  Location: [facility] ENDOSCOPY;  Service: Endoscopy;  Laterality: N/A;   PORTA CATH INSERTION N/A 07/17/2021   Procedure: PORTA CATH INSERTION;  Surgeon: Algernon Huxley, MD;  Location: [place] CV LAB;  Service: Cardiovascular;  Laterality: N/A;  Prior to Admission medications   Medication Sig Start Date End Date Taking? Authorizing Provider  albuterol (VENTOLIN HFA) 108 (90 Base) MCG/ACT inhaler INHALE 2 PUFFS BY MOUTH EVERY 6 HOURS AS NEEDED FOR WHEEZING AND FOR SHORTNESS OF BREATH 02/15/20   Steele Sizer, MD  aspirin 81 MG tablet Take 1 tablet by mouth daily. 11/05/09   [provider]   atorvastatin (LIPITOR) 40 MG tablet Take 1 tablet (40 mg total) by mouth at bedtime. 12/26/20   Steele Sizer, MD  blood glucose meter kit and supplies Dispense based on patient and insurance preference. Use up to four times daily as directed. (FOR ICD-10 E10.9, E11.9). 04/29/21   Steele Sizer, MD  dexamethasone (DECADRON) 4 MG tablet Take 2 tablets (8 mg total) by mouth daily. Take daily x 3 days starting the day after cisplatin chemotherapy. Take with food. Patient not taking: No sig reported 05/22/21   Sindy Guadeloupe, MD  diphenhydramine-acetaminophen (TYLENOL PM) 25-500 MG TABS tablet Take 1 tablet by mouth at bedtime as needed.    [provider]  Empagliflozin-metFORMIN HCl ER (SYNJARDY XR) 12.04-999 MG TB24 Take 2 tablets by mouth daily. New dose 12/26/20   Steele Sizer, MD  escitalopram (LEXAPRO) 10 MG tablet Take 1 tablet by mouth once daily 08/05/21   Steele Sizer, MD  fluticasone (FLONASE) 50 MCG/ACT nasal spray USE 2 SPRAY(S) IN EACH NOSTRIL AT BEDTIME Patient taking differently: Place 2 sprays into both nostrils daily as needed. USE 2 SPRAY(S) IN EACH NOSTRIL AT BEDTIME 02/19/21   Steele Sizer, MD  Fluticasone-Umeclidin-Vilant (TRELEGY ELLIPTA) 100-62.5-25 MCG/INH AEPB Take 1 puff by mouth daily. 12/26/20   Steele Sizer, MD  gabapentin (NEURONTIN) 300 MG capsule Take 2 capsules (600 mg total) by mouth 2 (two) times daily. 04/29/21   Steele Sizer, MD  Insulin Degludec (TRESIBA) 100 UNIT/ML SOLN Inject 12-30 units of lipase into the skin daily. To keep fasting glucose between 100-140 04/29/21   Sowles, Drue Stager, MD  Insulin Pen Needle 31G X 8 MM MISC 1 each by Does not apply route daily. 12/26/20   Steele Sizer, MD  lidocaine-prilocaine (EMLA) cream Apply to affected area once Patient not taking: No sig reported 05/22/21   Sindy Guadeloupe, MD  losartan (COZAAR) 25 MG tablet Take 1 tablet (25 mg total) by mouth daily. 08/01/21   Steele Sizer, MD  ondansetron (ZOFRAN)  8 MG tablet Take 1 tablet (8 mg total) by mouth 2 (two) times daily as needed. Start on the third day after cisplatin chemotherapy. 05/22/21   Sindy Guadeloupe, MD  prochlorperazine (COMPAZINE) 10 MG tablet Take 1 tablet (10 mg total) by mouth every 6 (six) hours as needed (Nausea or vomiting). 05/22/21   Sindy Guadeloupe, MD    Allergies  Allergen Reactions   Other Shortness Of Breath    Cat dander and grass pollen   Bydureon [Exenatide] Other (See Comments)    Pain with injection not allergy    Family History  Problem Relation Age of Onset   Heart attack Mother    CAD Mother    Kidney disease Father     Social History Social History   Tobacco Use   Smoking status: Some Days    Types: Cigarettes   Smokeless tobacco: Never   Tobacco comments:    2 per day 01/30/21  Vaping Use   Vaping Use: Never used  Substance Use Topics   Alcohol use: Yes    Alcohol/week: 0.0 standard drinks  Comment: rare maybe 1-2  year   Drug use: No    Review of Systems Constitutional: Negative for fever. ENT: Positive for sinus pain/pressure Cardiovascular: Negative for chest pain. Respiratory: Negative for shortness of breath. Gastrointestinal: Negative for abdominal pain, vomiting Musculoskeletal: Negative for musculoskeletal complaints Neurological: Negative for headache All other ROS negative  ____________________________________________   PHYSICAL EXAM:  VITAL SIGNS: ED Triage Vitals  Enc Vitals Group     BP 08/30/21 1708 (!) 175/87     Pulse Rate 08/30/21 1708 (!) 122     Resp 08/30/21 1708 18     Temp 08/30/21 1708 98.6 F (37 C)     Temp Source 08/30/21 1708 Oral     SpO2 08/30/21 1708 98 %     Weight 08/30/21 1709 142 lb (64.4 kg)     Height --      Head Circumference --      Peak Flow --      Pain Score 08/30/21 1708 9     Pain Loc --      Pain Edu? --      Excl. in [place]? --    Constitutional: Alert and oriented. Well appearing and in no distress. Eyes: Normal  exam ENT      Head: Normocephalic and atraumatic.       Nose: Mild congestion with mild sinus tenderness to palpation.      Mouth/Throat: Mucous membranes are moist. Cardiovascular: Normal rate, regular rhythm.  Respiratory: Normal respiratory effort without tachypnea nor retractions. Breath sounds are clear Gastrointestinal: Soft and nontender. No distention.   Musculoskeletal: Nontender with normal range of motion in all extremities.  Neurologic:  Normal speech and language. No gross focal neurologic deficits Skin:  Skin is warm, dry and intact.  Psychiatric: Mood and affect are normal.   ____________________________________________   RADIOLOGY  IMPRESSION:  No acute cardiopulmonary abnormality.   ____________________________________________   INITIAL IMPRESSION / ASSESSMENT AND PLAN / ED COURSE  Pertinent labs & imaging results that were available during my care of the patient were reviewed by me and considered in my medical decision making (see chart for details).   Patient presents emergency department for worsening sinus congestion pain cough intermittent nausea and vomiting.  Patient does have moderate sinus tenderness to palpation with mild rhinorrhea, no fever but positive for chills.  Intermittent nausea or vomiting but the patient is currently on chemotherapy and states that is fairly typical for her.  Patient has a history of cholangiocarcinoma stage IV nonresectable currently on chemotherapy.  We will check labs, obtain CT imaging of the face as a precaution, COVID swab.  We will treat pain nausea IV hydrate while awaiting results.  Patient agreeable to plan of care.   CT shows near opacification of the right maxillary sinus.  Patient's COVID test has resulted positive.  Patient states her symptoms have been ongoing for the past 2 weeks, patient does not appear to be a candidate for antivirals.  Given the complete opacification and pain however I do believe it is still  reasonable to cover with an antibiotic for acute sinusitis and have the patient follow-up with her doctor.  Patient is to call her oncologist Monday morning to inform them of her COVID-positive status.  Janice Short was evaluated in Emergency Department on 08/30/2021 for the symptoms described in the history of present illness. She was evaluated in the context of the global COVID-19 pandemic, which necessitated consideration that the patient might be at risk for  infection with the SARS-CoV-2 virus that causes COVID-19. Institutional protocols and algorithms that pertain to the evaluation of patients at risk for COVID-19 are in a state of rapid change based on information released by regulatory bodies including the CDC and federal and state organizations. These policies and algorithms were followed during the patient's care in the ED.  ____________________________________________   FINAL CLINICAL IMPRESSION(S) / ED DIAGNOSES  Sinusitis COVID-19   Harvest Dark, MD 08/30/21 2300

## 2021-08-30 NOTE — ED Notes (Signed)
Lab request to draw labwork.

## 2021-08-30 NOTE — ED Notes (Signed)
Pt OTF with CT

## 2021-08-30 NOTE — ED Notes (Signed)
Pt found to be dry heaving.  Refused protocol Zofran.  Sts she will "wait it out."

## 2021-08-30 NOTE — ED Triage Notes (Signed)
Pt c/o cough and congestion x 2 weeks and diarrhea x 2 days. C/o nasal pain.  Pain score 9/10.  Pt reports taking OTC medication w/o relief.  Pt reports chemo x 2 weeks ago.

## 2021-09-01 ENCOUNTER — Inpatient Hospital Stay: Payer: Medicare HMO

## 2021-09-01 ENCOUNTER — Telehealth: Payer: Self-pay | Admitting: *Deleted

## 2021-09-01 ENCOUNTER — Inpatient Hospital Stay: Payer: Medicare HMO | Admitting: Nurse Practitioner

## 2021-09-01 NOTE — Telephone Encounter (Signed)
Janice Short- please move appt out by 3 weeks Lauren- please call in for paxlovid. She has baseline abnormal LFT's. See if dose needs adjustment. thanks

## 2021-09-01 NOTE — Telephone Encounter (Signed)
Patient called reporting that she has COVID. She is scheduled to be in office this morning

## 2021-09-05 NOTE — Telephone Encounter (Signed)
I called again today and got pt's voicemail and wanted to see if she got any of the other messages about how she is feeling from having covid and if she got the message and went and got the oral med to help with covid. Please callus back and see if there is anything we can do for her and left direct number

## 2021-09-12 ENCOUNTER — Other Ambulatory Visit: Payer: Self-pay | Admitting: Family Medicine

## 2021-09-12 DIAGNOSIS — F331 Major depressive disorder, recurrent, moderate: Secondary | ICD-10-CM

## 2021-09-15 ENCOUNTER — Ambulatory Visit: Payer: Medicare HMO | Admitting: Nurse Practitioner

## 2021-09-15 ENCOUNTER — Other Ambulatory Visit: Payer: Medicare HMO

## 2021-09-15 ENCOUNTER — Ambulatory Visit: Payer: Medicare HMO

## 2021-09-16 ENCOUNTER — Telehealth: Payer: Self-pay

## 2021-09-16 ENCOUNTER — Telehealth: Payer: Self-pay | Admitting: *Deleted

## 2021-09-16 NOTE — Chronic Care Management (AMB) (Signed)
  Care Management   Note  09/16/2021 Name: Janice Short MRN: [id number] DOB: [birth year]  Janice Short is a 60 y.o. year old female who is a primary care patient of Steele Sizer, MD and is actively engaged with the care management team. I reached out to [name] by phone today to assist with re-scheduling a follow up visit with the RN Case Manager  Follow up plan: Unsuccessful telephone outreach attempt made. A HIPAA compliant phone message was left for the patient providing contact information and requesting a return call.   Julian Hy, [place] Management  Direct Dial: [phone]

## 2021-09-16 NOTE — Telephone Encounter (Signed)
  Care Management   Follow Up Note   09/16/2021 Name: Janice Short MRN: [id number] DOB: 05-14-1961   Primary Care Provider: Steele Sizer, MD Reason for referral : Chronic Care Management   An unsuccessful telephone outreach was attempted today. The patient was referred to the case management team for assistance with care management and care coordination.    Follow Up Plan:  A HIPAA compliant voice message was left today requesting a return call.   Cristy [facility] Care Management [facility] [phone]

## 2021-09-18 NOTE — Progress Notes (Signed)
Name: Janice Short   MRN: [id number]    DOB: 1961/05/30   Date:09/19/2021       Progress Note  Subjective  Chief Complaint  Follow Up  HPI  DMII: A1C is 5.1 %, she is off [place] and [place], only on T[place]iba, weight is slightly up since last visit, able to eat more. She has associated microalbuminuria, HTN, dyslipidemia. She is not sure if she is taking statin therapy, taking losartan, denies side effects of medications. Denies polyphagia, polydipsia or polyuria.   Adenocarcinoma poorly differentiated ( cholangiocarcinoma) with mets :  she had lost 44 lbs from 2021 to 2022  she was not trying when she was seen in our office back in January 22 we had discussed colonoscopy , lung CT and look for cancer but she chose a fit test and asked to hold off on further testing. She went to [facility] on 03/2021 with nausea/abdominal cramping  and was diagnosed with intrahepatic cholangiocarcinoma. She is seeing Dr. Janese Banks ( oncologist ) , she had a PET scan done 04/16/2021, she had a visit with surgeon and was not advisable to have surgery, getting chemotherapy, able to tolerate food and gained some weight since last visit. She denies any abdominal pain at this time.    MDD: she has a long history of depression, periods of crying spell, lack of motivation, more stress due to diagnosis of cancer and would like to go up no dose of Lexapro from 10 mg to 20 mg. Denies suicidal thoughts or ideation   Lower extremity edema: both lower legs, feels tight, improves when she elevates, started this week, no redness or increase in warmth . She is high risk for clots due to cancer and we will get doppler US legs, but likely from malnutrition   Asthma/copd syndrome: still smokes cigarettes intermittently, used to be a heavy smoker, but down to less than 0.25 pack daily, she states cough has improved very seldom has wheezing,  she denies SOB with activity . She uses Trelegy occasionally    WUJ:WJXBJY medication, no chest pain or  palpitation, bp is at goal today    Chronic back pain: she has spinal stenosis, never had surgery , she is on disability for her back. . She states pain is daily, described as aching , right now is 7/10 , very seldom has radiculitis down left side, but no saddle anesthesia.   Cervical radiculitis and left shoulder impingement sign: she states left shoulder is painful, difficulty elevating her left arm, she asked for narcotics but she told me in the past she could get hydrocodone from the streets, I explained I cannot give her pain medications but can give her steroid injections, she agreed with that     Patient Active Problem List   Diagnosis Date Noted   Screen for colon cancer    Rectal polyp    Polyp of sigmoid colon    Abnormal findings on diagnostic imaging of digestive system    Gastritis with hemorrhage    Polyp of duodenum    Intrahepatic cholangiocarcinoma ([place]) 04/12/2021   Goals of care, counseling/discussion 04/12/2021   Mild episode of recurrent major depressive disorder ([place]) 02/08/2019   Dyslipidemia associated with type 2 diabetes mellitus ([place]) 12/17/2017   Noncompliance with medication regimen 12/17/2017   Spinal stenosis 10/15/2016   Diabetic polyneuropathy associated with type 2 diabetes mellitus ([place]) 07/14/2016   Chronic midline low back pain with sciatica 06/22/2016   Primary osteoarthritis of both knees 06/22/2016  Carpal tunnel syndrome 07/01/2015   Cervical radiculitis 07/01/2015   Corn or callus 07/01/2015   Impingement syndrome of shoulder 07/01/2015   Microalbuminuria 07/01/2015   Compulsive tobacco user syndrome 07/01/2015   Tenosynovitis of wrist 07/01/2015   Benign hypertension 11/05/2009   Uncontrolled type 2 diabetes mellitus with microalbuminuria ([place]) 11/05/2009   Athlete's foot 08/05/2009   Combined fat and carbohydrate induced hyperlipemia 01/08/2009   Vitamin D deficiency 01/08/2009   Asthma, mild intermittent 12/28/2008   Allergic  rhinitis 12/28/2008   Lumbosacral neuritis 08/11/2007    Past Surgical History:  Procedure Laterality Date   ABDOMINAL HYSTERECTOMY     COLONOSCOPY WITH PROPOFOL N/A 05/06/2021   Procedure: COLONOSCOPY WITH PROPOFOL;  Surgeon: Lucilla Lame, MD;  Location: [facility] ENDOSCOPY;  Service: Endoscopy;  Laterality: N/A;   ESOPHAGOGASTRODUODENOSCOPY (EGD) WITH PROPOFOL N/A 05/06/2021   Procedure: ESOPHAGOGASTRODUODENOSCOPY (EGD) WITH PROPOFOL;  Surgeon: Lucilla Lame, MD;  Location: [facility] ENDOSCOPY;  Service: Endoscopy;  Laterality: N/A;   PORTA CATH INSERTION N/A 07/17/2021   Procedure: PORTA CATH INSERTION;  Surgeon: Algernon Huxley, MD;  Location: [place] [facility];  Service: Cardiovascular;  Laterality: N/A;    Family History  Problem Relation Age of Onset   Heart attack Mother    CAD Mother    Kidney disease Father     Social History   Tobacco Use   Smoking status: Some Days    Types: Cigarettes   Smokeless tobacco: Never   Tobacco comments:    2 per day 01/30/21  Substance Use Topics   Alcohol use: Yes    Alcohol/week: 0.0 standard drinks    Comment: rare maybe 1-2  year     Current Outpatient Medications:    albuterol (VENTOLIN HFA) 108 (90 Base) MCG/ACT inhaler, INHALE 2 PUFFS BY MOUTH EVERY 6 HOURS AS NEEDED FOR WHEEZING AND FOR SHORTNESS OF BREATH, Disp: 18 g, Rfl: 1   aspirin 81 MG tablet, Take 1 tablet by mouth daily., Disp: , Rfl:    atorvastatin (LIPITOR) 40 MG tablet, Take 1 tablet (40 mg total) by mouth at bedtime., Disp: 90 tablet, Rfl: 1   blood glucose meter kit and supplies, Dispense based on patient and insurance preference. Use up to four times daily as directed. (FOR ICD-10 E10.9, E11.9)., Disp: 1 each, Rfl: 0   diphenhydramine-acetaminophen (TYLENOL PM) 25-500 MG TABS tablet, Take 1 tablet by mouth at bedtime as needed., Disp: , Rfl:    Empagliflozin-metFORMIN HCl ER (SYNJARDY XR) 12.04-999 MG TB24, Take 2 tablets by mouth daily. New dose, Disp: 180 tablet, Rfl: 1    escitalopram (LEXAPRO) 10 MG tablet, Take 1 tablet by mouth once daily, Disp: 30 tablet, Rfl: 0   fluticasone (FLONASE) 50 MCG/ACT nasal spray, USE 2 SPRAY(S) IN EACH NOSTRIL AT BEDTIME (Patient taking differently: Place 2 sprays into both nostrils daily as needed. USE 2 SPRAY(S) IN EACH NOSTRIL AT BEDTIME), Disp: 16 g, Rfl: 0   Fluticasone-Umeclidin-Vilant (TRELEGY ELLIPTA) 100-62.5-25 MCG/INH AEPB, Take 1 puff by mouth daily., Disp: 180 each, Rfl: 1   gabapentin (NEURONTIN) 300 MG capsule, Take 2 capsules (600 mg total) by mouth 2 (two) times daily., Disp: 360 capsule, Rfl: 1   Insulin Degludec (TRESIBA) 100 UNIT/ML SOLN, Inject 12-30 units of lipase into the skin daily. To keep fasting glucose between 100-140, Disp: 15 mL, Rfl: 0   Insulin Pen Needle 31G X 8 MM MISC, 1 each by Does not apply route daily., Disp: 100 each, Rfl: 1   losartan (COZAAR)  25 MG tablet, Take 1 tablet (25 mg total) by mouth daily., Disp: 90 tablet, Rfl: 0   ondansetron (ZOFRAN ODT) 4 MG disintegrating tablet, Take 1 tablet (4 mg total) by mouth every 8 (eight) hours as needed for nausea or vomiting., Disp: 20 tablet, Rfl: 0   ondansetron (ZOFRAN) 8 MG tablet, Take 1 tablet (8 mg total) by mouth 2 (two) times daily as needed. Start on the third day after cisplatin chemotherapy., Disp: 30 tablet, Rfl: 1   prochlorperazine (COMPAZINE) 10 MG tablet, Take 1 tablet (10 mg total) by mouth every 6 (six) hours as needed (Nausea or vomiting)., Disp: 30 tablet, Rfl: 1   dexamethasone (DECADRON) 4 MG tablet, Take 2 tablets (8 mg total) by mouth daily. Take daily x 3 days starting the day after cisplatin chemotherapy. Take with food. (Patient not taking: Reported on 09/19/2021), Disp: 30 tablet, Rfl: 1   HYDROcodone-acetaminophen (NORCO/VICODIN) 5-325 MG tablet, Take 1 tablet by mouth every 4 (four) hours as needed for moderate pain. (Patient not taking: Reported on 09/19/2021), Disp: 15 tablet, Rfl: 0   lidocaine-prilocaine (EMLA) cream,  Apply to affected area once (Patient not taking: No sig reported), Disp: 30 g, Rfl: 3  Allergies  Allergen Reactions   Other Shortness Of Breath    Cat dander and grass pollen   Bydureon [Exenatide] Other (See Comments)    Pain with injection not allergy    I personally reviewed active problem list, medication list, allergies, family history, social history, health maintenance with the patient/caregiver today.   ROS  Constitutional: Negative for fever, positive for  weight change.  Respiratory: positive for cough but no  shortness of breath.   Cardiovascular: Negative for chest pain or palpitations.  Gastrointestinal: Negative for abdominal pain, no bowel changes.  Musculoskeletal: Negative for gait problem or joint swelling.  Skin: Negative for rash.  Neurological: Negative for dizziness or headache.  No other specific complaints in a complete review of systems (except as listed in HPI above).   Objective  Vitals:   09/19/21 0733  BP: 116/64  Pulse: 91  Resp: 16  Temp: 97.9 F (36.6 C)  SpO2: 98%  Weight: 164 lb (74.4 kg)  Height: _0  (1.702 m)    Body mass index is 25.69 kg/m.  Physical Exam  Constitutional: Patient appears well-developed and malnourished No distress.  HEENT: head atraumatic, normocephalic, pupils equal and reactive to light, neck supple Cardiovascular: Normal rate, regular rhythm and normal heart sounds.  No murmur heard.  BLE edema, mild increase in warmth, no erythema, calves really tight,  plus pitting edema ankles and lower leg. Pulmonary/Chest: Effort normal and breath sounds normal. No respiratory distress. Abdominal: Soft.  There is no tenderness. Psychiatric: Patient has a normal mood and affect. behavior is normal. Judgment and thought content normal.   Recent Results (from the past 2160 hour(s))  Magnesium     Status: Abnormal   Collection Time: 06/23/21  8:18 AM  Result Value Ref Range   Magnesium 2.7 (H) 1.7 - 2.4 mg/dL     Comment: Performed at [facility], [address] [place], [place] [address]  Comprehensive metabolic panel     Status: Abnormal   Collection Time: 06/23/21  8:18 AM  Result Value Ref Range   Sodium 131 (L) 135 - 145 mmol/L   Potassium 3.7 3.5 - 5.1 mmol/L   Chloride 101 98 - 111 mmol/L   CO2 20 (L) 22 - 32 mmol/L   Glucose, Bld 168 (  H) 70 - 99 mg/dL    Comment: Glucose reference range applies only to samples taken after fasting for at least 8 hours.   BUN 24 (H) 8 - 23 mg/dL   Creatinine, Ser 1.16 (H) 0.44 - 1.00 mg/dL   Calcium 8.8 (L) 8.9 - 10.3 mg/dL   Total Protein 8.1 6.5 - 8.1 g/dL   Albumin 3.0 (L) 3.5 - 5.0 g/dL   AST 58 (H) 15 - 41 U/L   ALT 53 (H) 0 - 44 U/L   Alkaline Phosphatase 779 (H) 38 - 126 U/L   Total Bilirubin 3.5 (H) 0.3 - 1.2 mg/dL   GFR, Estimated 53 (L) >60 mL/min    Comment: (NOTE) Calculated using the CKD-EPI Creatinine Equation (2021)    Anion gap 10 5 - 15    Comment: Performed at [facility], [address] [place], [place] [address]  CBC with Differential     Status: Abnormal   Collection Time: 06/23/21  8:18 AM  Result Value Ref Range   WBC 4.5 4.0 - 10.5 K/uL   RBC 2.89 (L) 3.87 - 5.11 MIL/uL   Hemoglobin 9.4 (L) 12.0 - 15.0 g/dL   HCT 28.6 (L) 36.0 - 46.0 %   MCV 99.0 80.0 - 100.0 fL   MCH 32.5 26.0 - 34.0 pg   MCHC 32.9 30.0 - 36.0 g/dL   RDW 15.9 (H) 11.5 - 15.5 %   Platelets 254 150 - 400 K/uL   nRBC 0.0 0.0 - 0.2 %   Neutrophils Relative % 40 %   Neutro Abs 1.8 1.7 - 7.7 K/uL   Lymphocytes Relative 45 %   Lymphs Abs 2.0 0.7 - 4.0 K/uL   Monocytes Relative 11 %   Monocytes Absolute 0.5 0.1 - 1.0 K/uL   Eosinophils Relative 2 %   Eosinophils Absolute 0.1 0.0 - 0.5 K/uL   Basophils Relative 1 %   Basophils Absolute 0.1 0.0 - 0.1 K/uL   Immature Granulocytes 1 %   Abs Immature Granulocytes 0.04 0.00 - 0.07 K/uL    Comment: Performed at [facility], [address] [place], [place] [address]  Folate      Status: None   Collection Time: 06/23/21  8:18 AM  Result Value Ref Range   Folate 7.3 >5.9 ng/mL    Comment: Performed at [facility], [address] [place], [place] [address]  Vitamin B12     Status: None   Collection Time: 06/23/21  8:18 AM  Result Value Ref Range   Vitamin B-12 313 180 - 914 pg/mL    Comment: (NOTE) This assay is not validated for testing neonatal or myeloproliferative syndrome specimens for Vitamin B12 levels. Performed at [facility], [place] [address] [place], [place] [address]   Iron and TIBC     Status: None   Collection Time: 06/23/21  8:18 AM  Result Value Ref Range   Iron 57 28 - 170 ug/dL   TIBC 274 250 - 450 ug/dL   Saturation Ratios 21 10.4 - 31.8 %   UIBC 217 ug/dL    Comment: Performed at [facility], [address] [place], [place] [address]  Ferritin     Status: None   Collection Time: 06/23/21  8:18 AM  Result Value Ref Range   Ferritin 171 11 - 307 ng/mL    Comment: Performed at [facility], [address], [place], [place] [address]  Comprehensive metabolic panel  Status: Abnormal   Collection Time: 07/07/21  8:10 AM  Result Value Ref Range   Sodium 135 135 - 145 mmol/L   Potassium 3.5 3.5 - 5.1 mmol/L   Chloride 102 98 - 111 mmol/L   CO2 23 22 - 32 mmol/L   Glucose, Bld 141 (H) 70 - 99 mg/dL    Comment: Glucose reference range applies only to samples taken after fasting for at least 8 hours.   BUN 13 8 - 23 mg/dL   Creatinine, Ser 0.99 0.44 - 1.00 mg/dL   Calcium 8.7 (L) 8.9 - 10.3 mg/dL   Total Protein 7.9 6.5 - 8.1 g/dL   Albumin 2.8 (L) 3.5 - 5.0 g/dL   AST 49 (H) 15 - 41 U/L   ALT 36 0 - 44 U/L   Alkaline Phosphatase 826 (H) 38 - 126 U/L   Total Bilirubin 2.1 (H) 0.3 - 1.2 mg/dL   GFR, Estimated >60 >60 mL/min    Comment: (NOTE) Calculated using the CKD-EPI Creatinine Equation (2021)    Anion gap 10 5 - 15    Comment: Performed at [facility], [address]  [place], [place] [address]  CBC with Differential     Status: Abnormal   Collection Time: 07/07/21  8:10 AM  Result Value Ref Range   WBC 46.4 (H) 4.0 - 10.5 K/uL   RBC 3.06 (L) 3.87 - 5.11 MIL/uL   Hemoglobin 10.2 (L) 12.0 - 15.0 g/dL   HCT 32.0 (L) 36.0 - 46.0 %   MCV 104.6 (H) 80.0 - 100.0 fL   MCH 33.3 26.0 - 34.0 pg   MCHC 31.9 30.0 - 36.0 g/dL   RDW 20.6 (H) 11.5 - 15.5 %   Platelets 282 150 - 400 K/uL   nRBC 1.3 (H) 0.0 - 0.2 %   Neutrophils Relative % 73 %   Neutro Abs 34.0 (H) 1.7 - 7.7 K/uL   Lymphocytes Relative 10 %   Lymphs Abs 4.4 (H) 0.7 - 4.0 K/uL   Monocytes Relative 5 %   Monocytes Absolute 2.3 (H) 0.1 - 1.0 K/uL   Eosinophils Relative 1 %   Eosinophils Absolute 0.4 0.0 - 0.5 K/uL   Basophils Relative 0 %   Basophils Absolute 0.1 0.0 - 0.1 K/uL   WBC Morphology      MODERATE LEFT SHIFT (>5% METAS AND MYELOS,OCC PRO NOTED)    Comment: DIFF. CONFIRMD BY SMEAR   RBC Morphology MORPHOLOGY UNREMARKABLE    Smear Review PLATELETS APPEAR ADEQUATE    Immature Granulocytes 11 %    Comment: Increased IG's, likely caused by Bone Marrow Colony Stimulating Factor received within 30 days. NEULASTA ON 7.18.22    Abs Immature Granulocytes 5.21 (H) 0.00 - 0.07 K/uL   Polychromasia PRESENT    Giant PLTs PRESENT     Comment: Performed at [facility], [address] [place], [place] [address]  Magnesium     Status: None   Collection Time: 07/07/21  8:10 AM  Result Value Ref Range   Magnesium 2.4 1.7 - 2.4 mg/dL    Comment: Performed at [facility], [address] [place], [place] [address]  Magnesium     Status: None   Collection Time: 07/14/21  8:10 AM  Result Value Ref Range   Magnesium 2.4 1.7 - 2.4 mg/dL    Comment: Performed at [facility], [address] [place], [place] [address]  Comprehensive metabolic panel     Status: Abnormal  Collection Time: 07/14/21  8:10 AM  Result Value Ref Range   Sodium 131 (L) 135 - 145 mmol/L   Potassium 3.9  3.5 - 5.1 mmol/L   Chloride 99 98 - 111 mmol/L   CO2 24 22 - 32 mmol/L   Glucose, Bld 228 (H) 70 - 99 mg/dL    Comment: Glucose reference range applies only to samples taken after fasting for at least 8 hours.   BUN 19 8 - 23 mg/dL   Creatinine, Ser 1.04 (H) 0.44 - 1.00 mg/dL   Calcium 8.4 (L) 8.9 - 10.3 mg/dL   Total Protein 7.9 6.5 - 8.1 g/dL   Albumin 2.7 (L) 3.5 - 5.0 g/dL   AST 53 (H) 15 - 41 U/L   ALT 37 0 - 44 U/L   Alkaline Phosphatase 678 (H) 38 - 126 U/L   Total Bilirubin 2.0 (H) 0.3 - 1.2 mg/dL   GFR, Estimated >60 >60 mL/min    Comment: (NOTE) Calculated using the CKD-EPI Creatinine Equation (2021)    Anion gap 8 5 - 15    Comment: Performed at [facility], [address] [place], [place] [address]  CBC with Differential     Status: Abnormal   Collection Time: 07/14/21  8:10 AM  Result Value Ref Range   WBC 14.6 (H) 4.0 - 10.5 K/uL   RBC 2.55 (L) 3.87 - 5.11 MIL/uL   Hemoglobin 8.3 (L) 12.0 - 15.0 g/dL   HCT 26.1 (L) 36.0 - 46.0 %   MCV 102.4 (H) 80.0 - 100.0 fL   MCH 32.5 26.0 - 34.0 pg   MCHC 31.8 30.0 - 36.0 g/dL   RDW 18.2 (H) 11.5 - 15.5 %   Platelets 338 150 - 400 K/uL   nRBC 0.0 0.0 - 0.2 %   Neutrophils Relative % 68 %   Neutro Abs 10.0 (H) 1.7 - 7.7 K/uL   Lymphocytes Relative 19 %   Lymphs Abs 2.8 0.7 - 4.0 K/uL   Monocytes Relative 10 %   Monocytes Absolute 1.4 (H) 0.1 - 1.0 K/uL   Eosinophils Relative 1 %   Eosinophils Absolute 0.1 0.0 - 0.5 K/uL   Basophils Relative 1 %   Basophils Absolute 0.1 0.0 - 0.1 K/uL   Immature Granulocytes 1 %   Abs Immature Granulocytes 0.18 (H) 0.00 - 0.07 K/uL    Comment: Performed at [facility], [address] [place], [place] [address]  Glucose, capillary     Status: None   Collection Time: 07/17/21 11:52 AM  Result Value Ref Range   Glucose-Capillary 98 70 - 99 mg/dL    Comment: Glucose reference range applies only to samples taken after fasting for at least 8 hours.  Magnesium     Status:  None   Collection Time: 07/28/21  8:10 AM  Result Value Ref Range   Magnesium 2.2 1.7 - 2.4 mg/dL    Comment: Performed at [facility], [address] [place], [place] [address]  Comprehensive metabolic panel     Status: Abnormal   Collection Time: 07/28/21  8:10 AM  Result Value Ref Range   Sodium 132 (L) 135 - 145 mmol/L   Potassium 3.7 3.5 - 5.1 mmol/L   Chloride 100 98 - 111 mmol/L   CO2 26 22 - 32 mmol/L   Glucose, Bld 209 (H) 70 - 99 mg/dL    Comment: Glucose reference range applies only to samples taken after fasting for at least 8 hours.  BUN 16 8 - 23 mg/dL   Creatinine, Ser 1.18 (H) 0.44 - 1.00 mg/dL   Calcium 8.2 (L) 8.9 - 10.3 mg/dL   Total Protein 7.3 6.5 - 8.1 g/dL   Albumin 2.4 (L) 3.5 - 5.0 g/dL   AST 40 15 - 41 U/L   ALT 23 0 - 44 U/L   Alkaline Phosphatase 751 (H) 38 - 126 U/L   Total Bilirubin 1.4 (H) 0.3 - 1.2 mg/dL   GFR, Estimated 52 (L) >60 mL/min    Comment: (NOTE) Calculated using the CKD-EPI Creatinine Equation (2021)    Anion gap 6 5 - 15    Comment: Performed at [facility], [address] [place], [place] [address]  CBC with Differential     Status: Abnormal   Collection Time: 07/28/21  8:10 AM  Result Value Ref Range   WBC 50.2 (HH) 4.0 - 10.5 K/uL    Comment: REPEATED TO VERIFY THIS CRITICAL RESULT HAS VERIFIED AND BEEN CALLED TO SHERRY VENABLE BY KIM ROOS ON 08 22 2022 AT 0934, AND HAS BEEN READ BACK.     RBC 2.23 (L) 3.87 - 5.11 MIL/uL   Hemoglobin 7.7 (L) 12.0 - 15.0 g/dL   HCT 24.6 (L) 36.0 - 46.0 %   MCV 110.3 (H) 80.0 - 100.0 fL   MCH 34.5 (H) 26.0 - 34.0 pg   MCHC 31.3 30.0 - 36.0 g/dL   RDW 23.8 (H) 11.5 - 15.5 %   Platelets 239 150 - 400 K/uL   nRBC 1.9 (H) 0.0 - 0.2 %   Neutrophils Relative % 74 %   Neutro Abs 37.0 (H) 1.7 - 7.7 K/uL   Lymphocytes Relative 7 %   Lymphs Abs 3.4 0.7 - 4.0 K/uL   Monocytes Relative 8 %   Monocytes Absolute 4.2 (H) 0.1 - 1.0 K/uL   Eosinophils Relative 2 %   Eosinophils  Absolute 1.0 (H) 0.0 - 0.5 K/uL   Basophils Relative 0 %   Basophils Absolute 0.1 0.0 - 0.1 K/uL   WBC Morphology MILD LEFT SHIFT (1-5% METAS, OCC MYELO, OCC BANDS)     Comment: DIFF CONFIRMED BY MANUAL.   RBC Morphology MIXED RBC POPULATION    Smear Review Normal platelet morphology     Comment: PLATELETS APPEAR DECREASED   Immature Granulocytes 9 %   Abs Immature Granulocytes 4.58 (H) 0.00 - 0.07 K/uL    Comment: Performed at [facility], [address], [place], [place] [address]  Magnesium     Status: None   Collection Time: 08/04/21  8:05 AM  Result Value Ref Range   Magnesium 2.1 1.7 - 2.4 mg/dL    Comment: Performed at [facility], [address] [place], [place] [address]  Comprehensive metabolic panel     Status: Abnormal   Collection Time: 08/04/21  8:05 AM  Result Value Ref Range   Sodium 135 135 - 145 mmol/L   Potassium 4.4 3.5 - 5.1 mmol/L   Chloride 105 98 - 111 mmol/L   CO2 24 22 - 32 mmol/L   Glucose, Bld 208 (H) 70 - 99 mg/dL    Comment: Glucose reference range applies only to samples taken after fasting for at least 8 hours.   BUN 14 8 - 23 mg/dL   Creatinine, Ser 0.91 0.44 - 1.00 mg/dL   Calcium 8.4 (L) 8.9 - 10.3 mg/dL   Total Protein 7.3 6.5 - 8.1 g/dL   Albumin 2.4 (L) 3.5 - 5.0 g/dL  AST 43 (H) 15 - 41 U/L   ALT 23 0 - 44 U/L   Alkaline Phosphatase 578 (H) 38 - 126 U/L   Total Bilirubin 1.2 0.3 - 1.2 mg/dL   GFR, Estimated >60 >60 mL/min    Comment: (NOTE) Calculated using the CKD-EPI Creatinine Equation (2021)    Anion gap 6 5 - 15    Comment: Performed at [facility], [address] [place], [place] [address]  CBC with Differential     Status: Abnormal   Collection Time: 08/04/21  8:05 AM  Result Value Ref Range   WBC 35.7 (H) 4.0 - 10.5 K/uL   RBC 2.32 (L) 3.87 - 5.11 MIL/uL   Hemoglobin 8.0 (L) 12.0 - 15.0 g/dL   HCT 26.0 (L) 36.0 - 46.0 %   MCV 112.1 (H) 80.0 - 100.0 fL   MCH 34.5 (H) 26.0 - 34.0 pg   MCHC 30.8  30.0 - 36.0 g/dL   RDW 21.9 (H) 11.5 - 15.5 %   Platelets 324 150 - 400 K/uL   nRBC 2.5 (H) 0.0 - 0.2 %   Neutrophils Relative % 67 %   Neutro Abs 23.9 (H) 1.7 - 7.7 K/uL   Lymphocytes Relative 13 %   Lymphs Abs 4.6 (H) 0.7 - 4.0 K/uL   Monocytes Relative 5 %   Monocytes Absolute 1.8 (H) 0.1 - 1.0 K/uL   Eosinophils Relative 1 %   Eosinophils Absolute 0.4 0.0 - 0.5 K/uL   Basophils Relative 0 %   Basophils Absolute 0.0 0.0 - 0.1 K/uL   WBC Morphology      MARKED LEFT SHIFT (>5% METAS,MYELOS AND PROS, OCC BLAST NOTED)    Comment: TOXIC GRANULATION DIFF. CONFIRMED BY SMEAR    RBC Morphology MORPHOLOGY UNREMARKABLE     Comment: NRBC PRESENT   Smear Review Normal platelet morphology     Comment: PLATELETS APPEAR ADEQUATE   Metamyelocytes Relative 1 %   Myelocytes 8 %   Promyelocytes Relative 4 %   Blasts 1 %   Abs Immature Granulocytes 4.60 (H) 0.00 - 0.07 K/uL   Polychromasia PRESENT     Comment: Performed at [facility], [address], [place], [place] [address]  Magnesium     Status: None   Collection Time: 08/25/21  8:26 AM  Result Value Ref Range   Magnesium 1.9 1.7 - 2.4 mg/dL    Comment: Performed at [facility], [address] [place], [place] [address]  Comprehensive metabolic panel     Status: Abnormal   Collection Time: 08/25/21  8:26 AM  Result Value Ref Range   Sodium 134 (L) 135 - 145 mmol/L   Potassium 4.3 3.5 - 5.1 mmol/L   Chloride 107 98 - 111 mmol/L   CO2 19 (L) 22 - 32 mmol/L   Glucose, Bld 231 (H) 70 - 99 mg/dL    Comment: Glucose reference range applies only to samples taken after fasting for at least 8 hours.   BUN 15 8 - 23 mg/dL   Creatinine, Ser 1.10 (H) 0.44 - 1.00 mg/dL   Calcium 8.4 (L) 8.9 - 10.3 mg/dL   Total Protein 7.8 6.5 - 8.1 g/dL   Albumin 2.8 (L) 3.5 - 5.0 g/dL   AST 43 (H) 15 - 41 U/L   ALT 23 0 - 44 U/L   Alkaline Phosphatase 541 (H) 38 - 126 U/L   Total Bilirubin 1.1 0.3 - 1.2 mg/dL   GFR, Estimated 57 (L) >60  mL/min    Comment: (NOTE) Calculated using the CKD-EPI Creatinine Equation (2021)    Anion gap 8 5 - 15    Comment: Performed at [facility], [address] [place], [place] [address]  CBC with Differential     Status: Abnormal   Collection Time: 08/25/21  8:26 AM  Result Value Ref Range   WBC 6.5 4.0 - 10.5 K/uL   RBC 2.90 (L) 3.87 - 5.11 MIL/uL   Hemoglobin 9.6 (L) 12.0 - 15.0 g/dL   HCT 30.7 (L) 36.0 - 46.0 %   MCV 105.9 (H) 80.0 - 100.0 fL   MCH 33.1 26.0 - 34.0 pg   MCHC 31.3 30.0 - 36.0 g/dL   RDW 17.8 (H) 11.5 - 15.5 %   Platelets 202 150 - 400 K/uL   nRBC 0.0 0.0 - 0.2 %   Neutrophils Relative % 59 %   Neutro Abs 3.9 1.7 - 7.7 K/uL   Lymphocytes Relative 21 %   Lymphs Abs 1.4 0.7 - 4.0 K/uL   Monocytes Relative 17 %   Monocytes Absolute 1.1 (H) 0.1 - 1.0 K/uL   Eosinophils Relative 1 %   Eosinophils Absolute 0.1 0.0 - 0.5 K/uL   Basophils Relative 1 %   Basophils Absolute 0.1 0.0 - 0.1 K/uL   WBC Morphology      LARGE GRANULAR LYMPHS PRESENT. VARYING LYMPH CELL POPULATION.   RBC Morphology MORPHOLOGY UNREMARKABLE    Smear Review Normal platelet morphology     Comment: PLATELETS APPEAR ADEQUATE Reviewed    Immature Granulocytes 1 %   Abs Immature Granulocytes 0.03 0.00 - 0.07 K/uL    Comment: Performed at [facility], [address] [place], [place] [address]  Comprehensive metabolic panel     Status: Abnormal   Collection Time: 08/30/21  5:53 PM  Result Value Ref Range   Sodium 134 (L) 135 - 145 mmol/L   Potassium 4.6 3.5 - 5.1 mmol/L   Chloride 105 98 - 111 mmol/L   CO2 20 (L) 22 - 32 mmol/L   Glucose, Bld 215 (H) 70 - 99 mg/dL    Comment: Glucose reference range applies only to samples taken after fasting for at least 8 hours.   BUN 15 8 - 23 mg/dL   Creatinine, Ser 0.99 0.44 - 1.00 mg/dL   Calcium 8.7 (L) 8.9 - 10.3 mg/dL   Total Protein 8.4 (H) 6.5 - 8.1 g/dL   Albumin 3.1 (L) 3.5 - 5.0 g/dL   AST 35 15 - 41 U/L   ALT 26 0 - 44 U/L    Alkaline Phosphatase 448 (H) 38 - 126 U/L   Total Bilirubin 1.2 0.3 - 1.2 mg/dL   GFR, Estimated >60 >60 mL/min    Comment: (NOTE) Calculated using the CKD-EPI Creatinine Equation (2021)    Anion gap 9 5 - 15    Comment: Performed at [facility], [address] [place], [place] [address]  CBC     Status: Abnormal   Collection Time: 08/30/21  5:53 PM  Result Value Ref Range   WBC 5.1 4.0 - 10.5 K/uL   RBC 3.14 (L) 3.87 - 5.11 MIL/uL   Hemoglobin 10.6 (L) 12.0 - 15.0 g/dL   HCT 32.5 (L) 36.0 - 46.0 %   MCV 103.5 (H) 80.0 - 100.0 fL   MCH 33.8 26.0 - 34.0 pg   MCHC 32.6 30.0 - 36.0 g/dL   RDW 16.5 (H) 11.5 - 15.5 %   Platelets  178 150 - 400 K/uL   nRBC 0.0 0.0 - 0.2 %    Comment: Performed at [facility], [address] [place], [place] [address]  Resp Panel by RT-PCR (Flu A&B, Covid) Nasopharyngeal Swab     Status: Abnormal   Collection Time: 08/30/21  9:27 PM   Specimen: Nasopharyngeal Swab; Nasopharyngeal(NP) swabs in vial transport medium  Result Value Ref Range   SARS Coronavirus 2 by RT PCR POSITIVE (A) NEGATIVE    Comment: RESULT CALLED TO, READ BACK BY AND VERIFIED WITH: AMANDA NIVALA AT 2234 08/30/21.PMF (NOTE) SARS-CoV-2 target nucleic acids are DETECTED.  The SARS-CoV-2 RNA is generally detectable in upper respiratory specimens during the acute phase of infection. Positive results are indicative of the presence of the identified virus, but do not rule out bacterial infection or co-infection with other pathogens not detected by the test. Clinical correlation with patient history and other diagnostic information is necessary to determine patient infection status. The expected result is Negative.  Fact Sheet for Patients: EntrepreneurPulse.com.au  Fact Sheet for Healthcare Providers: IncredibleEmployment.be  This test is not yet approved or cleared by the [place] FDA and  has been authorized for  detection and/or diagnosis of SARS-CoV-2 by FDA under an Emergency Use Authorization (EUA).  This EUA will remain in effect (meaning this test can be  used) for the duration of  the COVID-19 declaration under Section 564(b)(1) of the Act, 21 U.S.C. section 360bbb-3(b)(1), unless the authorization is terminated or revoked sooner.     Influenza A by PCR NEGATIVE NEGATIVE   Influenza B by PCR NEGATIVE NEGATIVE    Comment: (NOTE) The Xpert Xpress SARS-CoV-2/FLU/RSV plus assay is intended as an aid in the diagnosis of influenza from Nasopharyngeal swab specimens and should not be used as a sole basis for treatment. Nasal washings and aspirates are unacceptable for Xpert Xpress SARS-CoV-2/FLU/RSV testing.  Fact Sheet for Patients: EntrepreneurPulse.com.au  Fact Sheet for Healthcare Providers: IncredibleEmployment.be  This test is not yet approved or cleared by the [place] FDA and has been authorized for detection and/or diagnosis of SARS-CoV-2 by FDA under an Emergency Use Authorization (EUA). This EUA will remain in effect (meaning this test can be used) for the duration of the COVID-19 declaration under Section 564(b)(1) of the Act, 21 U.S.C. section 360bbb-3(b)(1), unless the authorization is terminated or revoked.  Performed at [facility], [address] [place], [place] [address]   POCT HgB A1C     Status: None   Collection Time: 09/19/21  7:43 AM  Result Value Ref Range   Hemoglobin A1C 5.1 4.0 - 5.6 %   HbA1c POC (<> result, manual entry)     HbA1c, POC (prediabetic range)     HbA1c, POC (controlled diabetic range)        PHQ2/9: Depression screen [facility] 2/9 09/19/2021 05/02/2021 04/29/2021 01/30/2021 12/26/2020  Decreased Interest 0 1 1 0 0  Down, Depressed, Hopeless _0 0 0  PHQ - 2 Score _1 0 0  Altered sleeping 0 0 0 - 0  Tired, decreased energy 1 0 0 - 0  Change in appetite 0 1 1 - 0  Feeling bad or failure  about yourself  0 0 0 - 0  Trouble concentrating 0 0 0 - 0  Moving slowly or fidgety/restless 0 0 0 - 0  Suicidal thoughts 0 0 0 - 0  PHQ-9 Score _2 - 0  Difficult doing work/chores - - Not  difficult at all - -  Some recent data might be hidden    phq 9 is negative   Fall Risk: Fall Risk  09/19/2021 04/29/2021 01/30/2021 12/26/2020 02/15/2020  Falls in the past year? 0 0 0 0 0  Number falls in past yr: 0 0 0 0 0  Injury with Fall? 0 0 0 0 0  Comment - - - - -  Risk for fall due to : No Fall Risks - No Fall Risks - -  Follow up Falls prevention discussed Falls prevention discussed Falls prevention discussed - Falls evaluation completed      Functional Status Survey: Is the patient deaf or have difficulty hearing?: No Does the patient have difficulty seeing, even when wearing glasses/contacts?: No Does the patient have difficulty concentrating, remembering, or making decisions?: No Does the patient have difficulty walking or climbing stairs?: Yes Does the patient have difficulty dressing or bathing?: No Does the patient have difficulty doing errands alone such as visiting a doctor's office or shopping?: No    Assessment & Plan  1. Diabetes mellitus type 2 with complications (HCC)  - POCT HgB A1C  2. Intrahepatic cholangiocarcinoma ([place])  Keep follow up with Dr. Janese Banks   3. Controlled type 2 diabetes mellitus with microalbuminuria, with long-term current use of insulin ([place])   4. Moderate protein-calorie malnutrition ([place])  Continue high protein diet   5. Hypertension associated with type 2 diabetes mellitus ([place])   6. Benign hypertension  - losartan (COZAAR) 25 MG tablet; Take 1 tablet (25 mg total) by mouth daily.  Dispense: 90 tablet; Refill: 0  7. Dyslipidemia associated with type 2 diabetes mellitus ([place])   8. Asthma-COPD overlap syndrome ([place])  - Fluticasone-Umeclidin-Vilant (TRELEGY ELLIPTA) 100-62.5-25 MCG/INH AEPB; Take 1 puff by mouth daily.  Dispense:  180 each; Refill: 1  9. Vitamin D deficiency   10. Moderate episode of recurrent major depressive disorder (HCC)  - escitalopram (LEXAPRO) 20 MG tablet; Take 1 tablet (20 mg total) by mouth daily.  Dispense: 90 tablet; Refill: 1  11. Impingement syndrome of left shoulder   Consent signed: YES  Procedure: Shoulder Joint Injection Region: left Shoulder Location: anterior approach of affected shoulder Equipment used: 22 gauge 1.5 inch needle Medication: 1 mL Kenalog (89m/1mL) Anesthesia: out of lidocaine, used topical ethyl chloride  Cleaned and prepped: Betadine  The risks, benefits, treatment options discussed with patient prior to procedure.  Consent signed. Area cleansed with sterile betadine. Overlying skin numbed with liquid nitrogen.  Patient tolerated procedure well with no complications and no bleeding. Bandage placed at site of injection. Patient instructed on potential for steroid reaction pain within the initial 24-48hr period. May use ice packs directly on injected site as needed.   12. Needs flu shot  - Flu Vaccine QUAD 6+ mos PF IM (Fluarix Quad PF)   13. Bilateral lower extremity edema  - VAS [place] EXTREMITY VENOUS (DVT); Future

## 2021-09-19 ENCOUNTER — Other Ambulatory Visit: Payer: Self-pay

## 2021-09-19 ENCOUNTER — Encounter: Payer: Self-pay | Admitting: Family Medicine

## 2021-09-19 ENCOUNTER — Ambulatory Visit (INDEPENDENT_AMBULATORY_CARE_PROVIDER_SITE_OTHER): Payer: Medicare HMO | Admitting: Family Medicine

## 2021-09-19 VITALS — BP 116/64 | HR 91 | Temp 97.9°F | Resp 16 | Ht 67.0 in | Wt 164.0 lb

## 2021-09-19 DIAGNOSIS — Z23 Encounter for immunization: Secondary | ICD-10-CM | POA: Diagnosis not present

## 2021-09-19 DIAGNOSIS — J449 Chronic obstructive pulmonary disease, unspecified: Secondary | ICD-10-CM | POA: Diagnosis not present

## 2021-09-19 DIAGNOSIS — Z794 Long term (current) use of insulin: Secondary | ICD-10-CM

## 2021-09-19 DIAGNOSIS — R809 Proteinuria, unspecified: Secondary | ICD-10-CM

## 2021-09-19 DIAGNOSIS — E559 Vitamin D deficiency, unspecified: Secondary | ICD-10-CM

## 2021-09-19 DIAGNOSIS — E118 Type 2 diabetes mellitus with unspecified complications: Secondary | ICD-10-CM | POA: Diagnosis not present

## 2021-09-19 DIAGNOSIS — R600 Localized edema: Secondary | ICD-10-CM

## 2021-09-19 DIAGNOSIS — E440 Moderate protein-calorie malnutrition: Secondary | ICD-10-CM

## 2021-09-19 DIAGNOSIS — J4489 Other specified chronic obstructive pulmonary disease: Secondary | ICD-10-CM

## 2021-09-19 DIAGNOSIS — E1159 Type 2 diabetes mellitus with other circulatory complications: Secondary | ICD-10-CM

## 2021-09-19 DIAGNOSIS — E1169 Type 2 diabetes mellitus with other specified complication: Secondary | ICD-10-CM

## 2021-09-19 DIAGNOSIS — I10 Essential (primary) hypertension: Secondary | ICD-10-CM | POA: Diagnosis not present

## 2021-09-19 DIAGNOSIS — E1129 Type 2 diabetes mellitus with other diabetic kidney complication: Secondary | ICD-10-CM | POA: Diagnosis not present

## 2021-09-19 DIAGNOSIS — E785 Hyperlipidemia, unspecified: Secondary | ICD-10-CM

## 2021-09-19 DIAGNOSIS — C221 Intrahepatic bile duct carcinoma: Secondary | ICD-10-CM | POA: Diagnosis not present

## 2021-09-19 DIAGNOSIS — F331 Major depressive disorder, recurrent, moderate: Secondary | ICD-10-CM

## 2021-09-19 DIAGNOSIS — M7542 Impingement syndrome of left shoulder: Secondary | ICD-10-CM

## 2021-09-19 DIAGNOSIS — I152 Hypertension secondary to endocrine disorders: Secondary | ICD-10-CM

## 2021-09-19 LAB — POCT GLYCOSYLATED HEMOGLOBIN (HGB A1C): Hemoglobin A1C: 5.1 % (ref 4.0–5.6)

## 2021-09-19 MED ORDER — TRELEGY ELLIPTA 100-62.5-25 MCG/INH IN AEPB: 1.0000 | INHALATION_SPRAY | Freq: Every day | RESPIRATORY_TRACT | 1 refills | Status: DC

## 2021-09-19 MED ORDER — LOSARTAN POTASSIUM 25 MG PO TABS: 25.0000 mg | ORAL_TABLET | Freq: Every day | ORAL | 0 refills | Status: DC

## 2021-09-19 MED ORDER — ESCITALOPRAM OXALATE 20 MG PO TABS: 20.0000 mg | ORAL_TABLET | Freq: Every day | ORAL | 1 refills | Status: DC

## 2021-09-19 NOTE — Addendum Note (Signed)
Addended by: Carlene Coria on: 09/19/2021 01:18 PM   Modules accepted: Orders

## 2021-09-22 ENCOUNTER — Inpatient Hospital Stay: Payer: Medicare HMO | Attending: Oncology

## 2021-09-22 ENCOUNTER — Other Ambulatory Visit: Payer: Self-pay | Admitting: Family Medicine

## 2021-09-22 ENCOUNTER — Inpatient Hospital Stay: Payer: Medicare HMO

## 2021-09-22 ENCOUNTER — Other Ambulatory Visit: Payer: Self-pay

## 2021-09-22 VITALS — BP 140/75 | HR 98 | Temp 96.0°F | Resp 19 | Wt 164.0 lb

## 2021-09-22 DIAGNOSIS — Z5111 Encounter for antineoplastic chemotherapy: Secondary | ICD-10-CM | POA: Insufficient documentation

## 2021-09-22 DIAGNOSIS — C221 Intrahepatic bile duct carcinoma: Secondary | ICD-10-CM | POA: Insufficient documentation

## 2021-09-22 LAB — COMPREHENSIVE METABOLIC PANEL
ALT: 22 U/L (ref 0–44)
AST: 38 U/L (ref 15–41)
Albumin: 2.7 g/dL — ABNORMAL LOW (ref 3.5–5.0)
Alkaline Phosphatase: 326 U/L — ABNORMAL HIGH (ref 38–126)
Anion gap: 4 — ABNORMAL LOW (ref 5–15)
BUN: 13 mg/dL (ref 8–23)
CO2: 24 mmol/L (ref 22–32)
Calcium: 8.2 mg/dL — ABNORMAL LOW (ref 8.9–10.3)
Chloride: 107 mmol/L (ref 98–111)
Creatinine, Ser: 0.95 mg/dL (ref 0.44–1.00)
GFR, Estimated: >60 mL/min (ref >60)
Glucose, Bld: 184 mg/dL — ABNORMAL HIGH (ref 70–99)
Potassium: 4.2 mmol/L (ref 3.5–5.1)
Sodium: 135 mmol/L (ref 135–145)
Total Bilirubin: 0.4 mg/dL (ref 0.3–1.2)
Total Protein: 7.6 g/dL (ref 6.5–8.1)

## 2021-09-22 LAB — CBC WITH DIFFERENTIAL/PLATELET
Abs Immature Granulocytes: 0.02 10*3/uL (ref 0.00–0.07)
Basophils Absolute: 0.1 10*3/uL (ref 0.0–0.1)
Basophils Relative: 1 %
Eosinophils Absolute: 0.3 10*3/uL (ref 0.0–0.5)
Eosinophils Relative: 4 %
HCT: 27.8 % — ABNORMAL LOW (ref 36.0–46.0)
Hemoglobin: 8.8 g/dL — ABNORMAL LOW (ref 12.0–15.0)
Immature Granulocytes: 0 %
Lymphocytes Relative: 25 %
Lymphs Abs: 1.8 10*3/uL (ref 0.7–4.0)
MCH: 31.2 pg (ref 26.0–34.0)
MCHC: 31.7 g/dL (ref 30.0–36.0)
MCV: 98.6 fL (ref 80.0–100.0)
Monocytes Absolute: 1.1 10*3/uL — ABNORMAL HIGH (ref 0.1–1.0)
Monocytes Relative: 15 %
Neutro Abs: 4 10*3/uL (ref 1.7–7.7)
Neutrophils Relative %: 55 %
Platelets: 191 10*3/uL (ref 150–400)
RBC: 2.82 MIL/uL — ABNORMAL LOW (ref 3.87–5.11)
RDW: 16.6 % — ABNORMAL HIGH (ref 11.5–15.5)
WBC: 7.3 10*3/uL (ref 4.0–10.5)
nRBC: 0 % (ref 0.0–0.2)

## 2021-09-22 LAB — MAGNESIUM: Magnesium: 1.9 mg/dL (ref 1.7–2.4)

## 2021-09-22 MED ORDER — PALONOSETRON HCL INJECTION 0.25 MG/5ML
0.2500 mg | Freq: Once | INTRAVENOUS | Status: AC
  Administered 2021-09-22: 0.25 mg via INTRAVENOUS
  Filled 2021-09-22: qty 5

## 2021-09-22 MED ORDER — SODIUM CHLORIDE 0.9 % IV SOLN
25.0000 mg/m2 | Freq: Once | INTRAVENOUS | Status: AC
  Administered 2021-09-22: 45 mg via INTRAVENOUS
  Filled 2021-09-22: qty 45

## 2021-09-22 MED ORDER — SODIUM CHLORIDE 0.9 % IV SOLN
150.0000 mg | Freq: Once | INTRAVENOUS | Status: AC
  Administered 2021-09-22: 150 mg via INTRAVENOUS
  Filled 2021-09-22: qty 150

## 2021-09-22 MED ORDER — PEGFILGRASTIM 6 MG/0.6ML ~~LOC~~ PSKT
6.0000 mg | PREFILLED_SYRINGE | Freq: Once | SUBCUTANEOUS | Status: AC
  Administered 2021-09-22: 6 mg via SUBCUTANEOUS
  Filled 2021-09-22: qty 0.6

## 2021-09-22 MED ORDER — HEPARIN SOD (PORK) LOCK FLUSH 100 UNIT/ML IV SOLN
INTRAVENOUS | Status: AC
  Administered 2021-09-22: 500 [IU]
  Filled 2021-09-22: qty 5

## 2021-09-22 MED ORDER — MAGNESIUM SULFATE 2 GM/50ML IV SOLN
2.0000 g | Freq: Once | INTRAVENOUS | Status: AC
  Administered 2021-09-22: 2 g via INTRAVENOUS
  Filled 2021-09-22: qty 50

## 2021-09-22 MED ORDER — SODIUM CHLORIDE 0.9 % IV SOLN
Freq: Once | INTRAVENOUS | Status: AC
  Filled 2021-09-22: qty 250

## 2021-09-22 MED ORDER — SODIUM CHLORIDE 0.9% FLUSH
10.0000 mL | Freq: Once | INTRAVENOUS | Status: AC
  Administered 2021-09-22: 10 mL via INTRAVENOUS
  Filled 2021-09-22: qty 10

## 2021-09-22 MED ORDER — SODIUM CHLORIDE 0.9 % IV SOLN
1400.0000 mg | Freq: Once | INTRAVENOUS | Status: AC
  Administered 2021-09-22: 1400 mg via INTRAVENOUS
  Filled 2021-09-22: qty 26.3

## 2021-09-22 MED ORDER — HEPARIN SOD (PORK) LOCK FLUSH 100 UNIT/ML IV SOLN
500.0000 [IU] | Freq: Once | INTRAVENOUS | Status: AC | PRN
  Filled 2021-09-22: qty 5

## 2021-09-22 MED ORDER — SODIUM CHLORIDE 0.9 % IV SOLN
10.0000 mg | Freq: Once | INTRAVENOUS | Status: AC
  Administered 2021-09-22: 10 mg via INTRAVENOUS
  Filled 2021-09-22: qty 10

## 2021-09-22 MED ORDER — POTASSIUM CHLORIDE IN NACL 20-0.9 MEQ/L-% IV SOLN
Freq: Once | INTRAVENOUS | Status: AC
  Filled 2021-09-22: qty 1000

## 2021-09-22 NOTE — Patient Instructions (Signed)
[facility]COL[facility]  Discharge Instructions: Thank you for choosing [facility] to provide your oncology and hematology care.  If you have a lab appointment with the [facility], please go directly to the [facility] and check in at the registration area.  Wear comfortable clothing and clothing appropriate for easy access to any Portacath or PICC line.   We strive to give you quality time with your provider. You may need to reschedule your appointment if you arrive late (15 or more minutes).  Arriving late affects you and other patients whose appointments are after yours.  Also, if you miss three or more appointments without notifying the office, you may be dismissed from the clinic at the provider's discretion.      For prescription refill requests, have your pharmacy contact our office and allow 72 hours for refills to be completed.    Today you received the following chemotherapy and/or immunotherapy agents Cisplatin   To help prevent nausea and vomiting after your treatment, we encourage you to take your nausea medication as directed.  BELOW ARE SYMPTOMS THAT SHOULD BE REPORTED IMMEDIATELY: *FEVER GREATER THAN 100.4 F (38 C) OR HIGHER *CHILLS OR SWEATING *NAUSEA AND VOMITING THAT IS NOT CONTROLLED WITH YOUR NAUSEA MEDICATION *UNUSUAL SHORTNESS OF BREATH *UNUSUAL BRUISING OR BLEEDING *URINARY PROBLEMS (pain or burning when urinating, or frequent urination) *BOWEL PROBLEMS (unusual diarrhea, constipation, pain near the anus) TENDERNESS IN MOUTH AND THROAT WITH OR WITHOUT PRESENCE OF ULCERS (sore throat, sores in mouth, or a toothache) UNUSUAL RASH, SWELLING OR PAIN  UNUSUAL VAGINAL DISCHARGE OR ITCHING   Items with * indicate a potential emergency and should be followed up as soon as possible or go to the Emergency Department if any problems should occur.  Please show the CHEMOTHERAPY ALERT CARD or IMMUNOTHERAPY ALERT CARD at check-in to  the Emergency Department and triage nurse.  Should you have questions after your visit or need to cancel or reschedule your appointment, please contact [facility]  [phone] and follow the prompts.  Office hours are 8:00 a.m. to 4:30 p.m. Monday - Friday. Please note that voicemails left after 4:00 p.m. may not be returned until the following business day.  We are closed weekends and major holidays. You have access to a nurse at all times for urgent questions. Please call the main number to the clinic [phone] and follow the prompts.  For any non-urgent questions, you may also contact your provider using MyChart. We now offer e-Visits for anyone 56 and older to request care online for non-urgent symptoms. For details visit [email or web address].   Also download the MyChart app! Go to the app store, search "MyChart", open the app, select [facility], and log in with your MyChart username and password.  Due to Covid, a mask is required upon entering the hospital/clinic. If you do not have a mask, one will be given to you upon arrival. For doctor visits, patients may have 1 support person aged 71 or older with them. For treatment visits, patients cannot have anyone with them due to current Covid guidelines and our immunocompromised population.

## 2021-09-23 ENCOUNTER — Telehealth: Payer: Self-pay

## 2021-09-23 ENCOUNTER — Inpatient Hospital Stay: Payer: Medicare HMO

## 2021-09-23 NOTE — Telephone Encounter (Addendum)
Nutrition Follow-up:  Patient with stage IV intrahepatic cholangiocarcinoma.  Patient receiving cisplatin and gemcitabine.    Called patient, no answer or option to leave voicemail.    Patient called right back.    Spoke with patient by phone for nutrition follow-up.  Patient reports that appetite is better.  Eating at least 3 meals per day.  Likes yogurt.  Had biscuit and hashbrowns this am.  Last night ate 2 chicken legs and wing with rice and macaroni and cheese.  Has oral nutrition supplements in refrigerator (boost) but does not drink them on regular basis.  Denies nutrition impact symptoms.      Medications: reviewed  Labs: reviewed  Anthropometrics:   Weight 164 lb yesterday 10/17  142 lb 11.2 oz on 9/19 155 lb 11.2 oz on 5/22   NUTRITION DIAGNOSIS: Inadequate oral intake improving   INTERVENTION:  Continue to encouraged high calorie, high protein foods to maintain weight during treatment    MONITORING, EVALUATION, GOAL: weight trends, intake   NEXT VISIT: Tuesday, Nov 11 phone call  Janice Short, [place], [place] Registered Dietitian [phone] (mobile)

## 2021-09-24 NOTE — Chronic Care Management (AMB) (Signed)
  Care Management   Note  09/24/2021 Name: Janice Short MRN: [id number] DOB: Sep 12, 1961  Janice Short is a 60 y.o. year old female who is a primary care patient of Steele Sizer, MD and is actively engaged with the care management team. I reached out to [name] by phone today to assist with re-scheduling a follow up visit with the RN Case Manager  Follow up plan: Telephone appointment with care management team member scheduled for: 10/08/2021  Julian Hy, [place], [place] Management  Direct Dial: [phone]

## 2021-10-06 ENCOUNTER — Inpatient Hospital Stay: Payer: Medicare HMO

## 2021-10-06 ENCOUNTER — Other Ambulatory Visit: Payer: Self-pay

## 2021-10-06 ENCOUNTER — Inpatient Hospital Stay (HOSPITAL_BASED_OUTPATIENT_CLINIC_OR_DEPARTMENT_OTHER): Payer: Medicare HMO | Admitting: Oncology

## 2021-10-06 ENCOUNTER — Encounter: Payer: Self-pay | Admitting: Oncology

## 2021-10-06 VITALS — BP 139/94 | HR 98 | Temp 96.3°F | Resp 18 | Wt 155.5 lb

## 2021-10-06 DIAGNOSIS — C221 Intrahepatic bile duct carcinoma: Secondary | ICD-10-CM | POA: Diagnosis not present

## 2021-10-06 DIAGNOSIS — Z5111 Encounter for antineoplastic chemotherapy: Secondary | ICD-10-CM

## 2021-10-06 LAB — CBC WITH DIFFERENTIAL/PLATELET
Abs Immature Granulocytes: 0.14 10*3/uL — ABNORMAL HIGH (ref 0.00–0.07)
Basophils Absolute: 0.1 10*3/uL (ref 0.0–0.1)
Basophils Relative: 0 %
Eosinophils Absolute: 0.3 10*3/uL (ref 0.0–0.5)
Eosinophils Relative: 2 %
HCT: 30.6 % — ABNORMAL LOW (ref 36.0–46.0)
Hemoglobin: 9.7 g/dL — ABNORMAL LOW (ref 12.0–15.0)
Immature Granulocytes: 1 %
Lymphocytes Relative: 16 %
Lymphs Abs: 2.6 10*3/uL (ref 0.7–4.0)
MCH: 30.2 pg (ref 26.0–34.0)
MCHC: 31.7 g/dL (ref 30.0–36.0)
MCV: 95.3 fL (ref 80.0–100.0)
Monocytes Absolute: 1 10*3/uL (ref 0.1–1.0)
Monocytes Relative: 6 %
Neutro Abs: 12.3 10*3/uL — ABNORMAL HIGH (ref 1.7–7.7)
Neutrophils Relative %: 75 %
Platelets: 231 10*3/uL (ref 150–400)
RBC: 3.21 MIL/uL — ABNORMAL LOW (ref 3.87–5.11)
RDW: 18.7 % — ABNORMAL HIGH (ref 11.5–15.5)
WBC: 16.5 10*3/uL — ABNORMAL HIGH (ref 4.0–10.5)
nRBC: 0 % (ref 0.0–0.2)

## 2021-10-06 LAB — COMPREHENSIVE METABOLIC PANEL
ALT: 27 U/L (ref 0–44)
AST: 34 U/L (ref 15–41)
Albumin: 2.8 g/dL — ABNORMAL LOW (ref 3.5–5.0)
Alkaline Phosphatase: 432 U/L — ABNORMAL HIGH (ref 38–126)
Anion gap: 7 (ref 5–15)
BUN: 13 mg/dL (ref 8–23)
CO2: 24 mmol/L (ref 22–32)
Calcium: 8.3 mg/dL — ABNORMAL LOW (ref 8.9–10.3)
Chloride: 106 mmol/L (ref 98–111)
Creatinine, Ser: 1.04 mg/dL — ABNORMAL HIGH (ref 0.44–1.00)
GFR, Estimated: >60 mL/min (ref >60)
Glucose, Bld: 161 mg/dL — ABNORMAL HIGH (ref 70–99)
Potassium: 4.2 mmol/L (ref 3.5–5.1)
Sodium: 137 mmol/L (ref 135–145)
Total Bilirubin: 0.7 mg/dL (ref 0.3–1.2)
Total Protein: 7.8 g/dL (ref 6.5–8.1)

## 2021-10-06 LAB — MAGNESIUM: Magnesium: 1.9 mg/dL (ref 1.7–2.4)

## 2021-10-06 MED ORDER — SODIUM CHLORIDE 0.9 % IV SOLN
25.0000 mg/m2 | Freq: Once | INTRAVENOUS | Status: AC
  Administered 2021-10-06: 45 mg via INTRAVENOUS
  Filled 2021-10-06: qty 45

## 2021-10-06 MED ORDER — HEPARIN SOD (PORK) LOCK FLUSH 100 UNIT/ML IV SOLN
500.0000 [IU] | Freq: Once | INTRAVENOUS | Status: AC
  Administered 2021-10-06: 500 [IU] via INTRAVENOUS
  Filled 2021-10-06: qty 5

## 2021-10-06 MED ORDER — MAGNESIUM SULFATE 2 GM/50ML IV SOLN
2.0000 g | Freq: Once | INTRAVENOUS | Status: AC
  Administered 2021-10-06: 2 g via INTRAVENOUS
  Filled 2021-10-06: qty 50

## 2021-10-06 MED ORDER — SODIUM CHLORIDE 0.9 % IV SOLN
10.0000 mg | Freq: Once | INTRAVENOUS | Status: AC
  Administered 2021-10-06: 10 mg via INTRAVENOUS
  Filled 2021-10-06: qty 10

## 2021-10-06 MED ORDER — SODIUM CHLORIDE 0.9 % IV SOLN
Freq: Once | INTRAVENOUS | Status: AC
  Filled 2021-10-06: qty 250

## 2021-10-06 MED ORDER — SODIUM CHLORIDE 0.9 % IV SOLN
150.0000 mg | Freq: Once | INTRAVENOUS | Status: AC
  Administered 2021-10-06: 150 mg via INTRAVENOUS
  Filled 2021-10-06: qty 150

## 2021-10-06 MED ORDER — POTASSIUM CHLORIDE IN NACL 20-0.9 MEQ/L-% IV SOLN
Freq: Once | INTRAVENOUS | Status: AC
  Filled 2021-10-06: qty 1000

## 2021-10-06 MED ORDER — PALONOSETRON HCL INJECTION 0.25 MG/5ML
0.2500 mg | Freq: Once | INTRAVENOUS | Status: AC
  Administered 2021-10-06: 0.25 mg via INTRAVENOUS
  Filled 2021-10-06: qty 5

## 2021-10-06 MED ORDER — HEPARIN SOD (PORK) LOCK FLUSH 100 UNIT/ML IV SOLN
INTRAVENOUS | Status: AC
  Filled 2021-10-06: qty 5

## 2021-10-06 MED ORDER — SODIUM CHLORIDE 0.9% FLUSH
10.0000 mL | Freq: Once | INTRAVENOUS | Status: AC
  Administered 2021-10-06: 10 mL via INTRAVENOUS
  Filled 2021-10-06: qty 10

## 2021-10-06 MED ORDER — SODIUM CHLORIDE 0.9 % IV SOLN
1400.0000 mg | Freq: Once | INTRAVENOUS | Status: AC
  Administered 2021-10-06: 1400 mg via INTRAVENOUS
  Filled 2021-10-06: qty 26.3

## 2021-10-06 NOTE — Progress Notes (Signed)
Pt wanting to know how long treatment will be. Pt reports hair thinning out. Approx 10lb weight loss since visit on 09/22/2021

## 2021-10-06 NOTE — Patient Instructions (Signed)
[facility]  Discharge Instructions: Thank you for choosing [facility] to provide your oncology and hematology care.  If you have a lab appointment with the [facility], please go directly to the [facility] and check in at the registration area.  Wear comfortable clothing and clothing appropriate for easy access to any Portacath or PICC line.   We strive to give you quality time with your provider. You may need to reschedule your appointment if you arrive late (15 or more minutes).  Arriving late affects you and other patients whose appointments are after yours.  Also, if you miss three or more appointments without notifying the office, you may be dismissed from the clinic at the provider's discretion.      For prescription refill requests, have your pharmacy contact our office and allow 72 hours for refills to be completed.    Today you received the following chemotherapy and/or immunotherapy agents : Gemzar / Cisplatin   To help prevent nausea and vomiting after your treatment, we encourage you to take your nausea medication as directed.  BELOW ARE SYMPTOMS THAT SHOULD BE REPORTED IMMEDIATELY: *FEVER GREATER THAN 100.4 F (38 C) OR HIGHER *CHILLS OR SWEATING *NAUSEA AND VOMITING THAT IS NOT CONTROLLED WITH YOUR NAUSEA MEDICATION *UNUSUAL SHORTNESS OF BREATH *UNUSUAL BRUISING OR BLEEDING *URINARY PROBLEMS (pain or burning when urinating, or frequent urination) *BOWEL PROBLEMS (unusual diarrhea, constipation, pain near the anus) TENDERNESS IN MOUTH AND THROAT WITH OR WITHOUT PRESENCE OF ULCERS (sore throat, sores in mouth, or a toothache) UNUSUAL RASH, SWELLING OR PAIN  UNUSUAL VAGINAL DISCHARGE OR ITCHING   Items with * indicate a potential emergency and should be followed up as soon as possible or go to the Emergency Department if any problems should occur.  Please show the CHEMOTHERAPY ALERT CARD or IMMUNOTHERAPY ALERT CARD at  check-in to the Emergency Department and triage nurse.  Should you have questions after your visit or need to cancel or reschedule your appointment, please contact [facility]  [phone] and follow the prompts.  Office hours are 8:00 a.m. to 4:30 p.m. Monday - Friday. Please note that voicemails left after 4:00 p.m. may not be returned until the following business day.  We are closed weekends and major holidays. You have access to a nurse at all times for urgent questions. Please call the main number to the clinic [phone] and follow the prompts.  For any non-urgent questions, you may also contact your provider using MyChart. We now offer e-Visits for anyone 13 and older to request care online for non-urgent symptoms. For details visit [email or web address].   Also download the MyChart app! Go to the app store, search "MyChart", open the app, select [facility], and log in with your MyChart username and password.  Due to Covid, a mask is required upon entering the hospital/clinic. If you do not have a mask, one will be given to you upon arrival. For doctor visits, patients may have 1 support person aged 70 or older with them. For treatment visits, patients cannot have anyone with them due to current Covid guidelines and our immunocompromised population.

## 2021-10-06 NOTE — Progress Notes (Signed)
Hematology/Oncology Consult note [facility]  Telephone:([phone] Fax:([phone]) [phone]  Patient Care Team: Steele Sizer, MD as PCP - General (Family Medicine) Neldon Labella, RN as Case Manager Clent Jacks, RN as Oncology Nurse Navigator Turpin, Robbins, LCSW as Social Worker Germaine Pomfret, [facility] (Pharmacist)   Name of the patient: Janice Short  [id number]  10-25-1961   Date of visit: 10/06/21  Diagnosis- metastatic intrahepatic cholangiocarcinoma   Chief complaint/ Reason for visit-on treatment assessment prior to cycle 6-day 1 of gemcitabine cisplatin chemotherapy  Heme/Onc history: Patient is a 60 year old African-American female with a past medical history significant for hypertension hyperlipidemia and type 2 diabetes who presented to the ER with symptoms of cramping abdominal pain and nausea.  This was followed by right upper quadrant ultrasound which showed multiple echogenic masses in the liver with nodular contour of the liver possibly suggestive of cirrhosis.  Several nodular masses in the vicinity of the pancreatic head and porta hepatic lymph nodes.  This was followed by an MRI abdomen and MRCP.  It showed right hepatic lobe masslike area measuring 4.9 x 2.9 cm.  Diffusion abnormality in the left hepatic lobe with mild left-sided biliary ductal dilatation and another area of abnormality measuring 1.4 x 1 cm.  Bulky celiac adenopathy 3 x 2.1 cm tracking into gastrohepatic recess.  Suspected portal venous invasion with convex protrusion in the right portal vein.  Left adrenal lesion measuring 2.9 x 1.5 cm.  Findings concerning for cholangiocarcinoma or biphenotypic cholangiole hepatocellular neoplasm.     Patient underwent CT-guided liver biopsy Which was consistent with adenocarcinoma poorly differentiated.  Immunohistochemistry showed CK7 positivity, CDX2 negative, Heppar negative, GATA3 negative.  CK19 positive.  Differentials include  cholangiocarcinoma, pancreatic, upper GI.  Lung and breast less likely.  However given CK19 positivity most compatible with cholangiocarcinoma.   Patient's case was discussed at tumor board and it was felt as if the lymphadenopathy was more like a confluent primary tumor mass.  Patient was referred to [facility] for second opinion to see if she would be a candidate for surgery.However upon their review of the PET scan it was deemed that this was indeed periportal adenopathy.  They also did a CT chest abdomen and pelvis with MIPS protocol and she was also found to have portocaval lymph node measuring 2.4 cm which was more consistent with metastatic disease.  She was deemed unresectable   NGS testing showed BRCA2, FGFR2, PIK3CA, RAD 54L, CDC73, CDKN2 A/B CR EBBP rearrangement exon 31    Patient has been getting gemcitabine cisplatin chemotherapy 2 weeks on 1 week off since August 2022  Interval history-tolerating treatment well so far and denies any specific complaints at this time.  Denies any significant nausea or vomiting.  Denies any abdominal pain  ECOG PS- 1 Pain scale- 0   Review of systems- Review of Systems  Constitutional:  Positive for malaise/fatigue. Negative for chills, fever and weight loss.  HENT:  Negative for congestion, ear discharge and nosebleeds.   Eyes:  Negative for blurred vision.  Respiratory:  Negative for cough, hemoptysis, sputum production, shortness of breath and wheezing.   Cardiovascular:  Negative for chest pain, palpitations, orthopnea and claudication.  Gastrointestinal:  Negative for abdominal pain, blood in stool, constipation, diarrhea, heartburn, melena, nausea and vomiting.  Genitourinary:  Negative for dysuria, flank pain, frequency, hematuria and urgency.  Musculoskeletal:  Negative for back pain, joint pain and myalgias.  Skin:  Negative for rash.  Neurological:  Negative for dizziness, tingling, focal weakness, seizures, weakness and headaches.   Endo/Heme/Allergies:  Does not bruise/bleed easily.  Psychiatric/Behavioral:  Negative for depression and suicidal ideas. The patient does not have insomnia.      Allergies  Allergen Reactions   Other Shortness Of Breath    Cat dander and grass pollen   Bydureon [Exenatide] Other (See Comments)    Pain with injection not allergy     Past Medical History:  Diagnosis Date   Allergy    Asthma    Carpal tunnel syndrome on left    Cervical radiculitis    Chronic osteoarthritis    Corns and callosity    Depression    Dermatophytosis of foot    Diabetes mellitus without complication (HCC)    Gout    Hyperlipidemia    Hypertension    Impingement syndrome of left shoulder    Intrahepatic cholangiocarcinoma (HCC)    Lumbosacral neuritis    Microalbuminuria    Obesity    Supraventricular tachycardia ([place]) 07/01/2015   SVT (supraventricular tachycardia) (HCC)    Tenosynovitis of wrist    Vitamin D deficiency      Past Surgical History:  Procedure Laterality Date   ABDOMINAL HYSTERECTOMY     COLONOSCOPY WITH PROPOFOL N/A 05/06/2021   Procedure: COLONOSCOPY WITH PROPOFOL;  Surgeon: Lucilla Lame, MD;  Location: [facility] ENDOSCOPY;  Service: Endoscopy;  Laterality: N/A;   ESOPHAGOGASTRODUODENOSCOPY (EGD) WITH PROPOFOL N/A 05/06/2021   Procedure: ESOPHAGOGASTRODUODENOSCOPY (EGD) WITH PROPOFOL;  Surgeon: Lucilla Lame, MD;  Location: [facility] ENDOSCOPY;  Service: Endoscopy;  Laterality: N/A;   PORTA CATH INSERTION N/A 07/17/2021   Procedure: PORTA CATH INSERTION;  Surgeon: Algernon Huxley, MD;  Location: [place] CV LAB;  Service: Cardiovascular;  Laterality: N/A;    Social History   Socioeconomic History   Marital status: Single    Spouse name: Not on file   Number of children: 2   Years of education: Not on file   Highest education level: Not on file  Occupational History   Occupation: disable     Comment: from chronic back pain, 2019  Tobacco Use   Smoking status: Some Days     Types: Cigarettes   Smokeless tobacco: Never   Tobacco comments:    2 per day 01/30/21  Vaping Use   Vaping Use: Never used  Substance and Sexual Activity   Alcohol use: Yes    Alcohol/week: 0.0 standard drinks    Comment: rare maybe 1-2  year   Drug use: No   Sexual activity: Yes  Other Topics Concern   Not on file  Social History Narrative   She is on disability for chronic back pain. Pt's son lives with her   Social Determinants of Health   Financial Resource Strain: Medium Risk   Difficulty of Paying Living Expenses: Somewhat hard  Food Insecurity: No Food Insecurity   Worried About Charity fundraiser in the Last Year: Never true   Arboriculturist in the Last Year: Never true  Transportation Needs: No Transportation Needs   Lack of Transportation (Medical): No   Lack of Transportation (Non-Medical): No  Physical Activity: Inactive   Days of Exercise per Week: 0 days   Minutes of Exercise per Session: 0 min  Stress: Stress Concern Present   Feeling of Stress : To some extent  Social Connections: Moderately Isolated   Frequency of Communication with Friends and Family: More than three times a week   Frequency of  Social Gatherings with Friends and Family: Three times a week   Attends Religious Services: More than 4 times per year   Active Member of Clubs or Organizations: No   Attends Archivist Meetings: Never   Marital Status: Never married  Human resources officer Violence: Not At Risk   Fear of Current or Ex-Partner: No   Emotionally Abused: No   Physically Abused: No   Sexually Abused: No    Family History  Problem Relation Age of Onset   Heart attack Mother    CAD Mother    Kidney disease Father      Current Outpatient Medications:    albuterol (VENTOLIN HFA) 108 (90 Base) MCG/ACT inhaler, INHALE 2 PUFFS BY MOUTH EVERY 6 HOURS AS NEEDED FOR WHEEZING AND FOR SHORTNESS OF BREATH, Disp: 18 g, Rfl: 1   aspirin 81 MG tablet, Take 1 tablet by mouth daily.,  Disp: , Rfl:    atorvastatin (LIPITOR) 40 MG tablet, Take 1 tablet (40 mg total) by mouth at bedtime., Disp: 90 tablet, Rfl: 1   blood glucose meter kit and supplies, Dispense based on patient and insurance preference. Use up to four times daily as directed. (FOR ICD-10 E10.9, E11.9)., Disp: 1 each, Rfl: 0   diphenhydramine-acetaminophen (TYLENOL PM) 25-500 MG TABS tablet, Take 1 tablet by mouth at bedtime as needed., Disp: , Rfl:    escitalopram (LEXAPRO) 20 MG tablet, Take 1 tablet (20 mg total) by mouth daily., Disp: 90 tablet, Rfl: 1   fluticasone (FLONASE) 50 MCG/ACT nasal spray, USE 2 SPRAY(S) IN EACH NOSTRIL AT BEDTIME (Patient taking differently: Place 2 sprays into both nostrils daily as needed. USE 2 SPRAY(S) IN EACH NOSTRIL AT BEDTIME), Disp: 16 g, Rfl: 0   Fluticasone-Umeclidin-Vilant (TRELEGY ELLIPTA) 100-62.5-25 MCG/INH AEPB, Take 1 puff by mouth daily., Disp: 180 each, Rfl: 1   gabapentin (NEURONTIN) 300 MG capsule, Take 2 capsules (600 mg total) by mouth 2 (two) times daily., Disp: 360 capsule, Rfl: 1   Insulin Degludec (TRESIBA) 100 UNIT/ML SOLN, Inject 12-30 units of lipase into the skin daily. To keep fasting glucose between 100-140, Disp: 15 mL, Rfl: 0   Insulin Pen Needle 31G X 8 MM MISC, 1 each by Does not apply route daily., Disp: 100 each, Rfl: 1   losartan (COZAAR) 25 MG tablet, Take 1 tablet (25 mg total) by mouth daily., Disp: 90 tablet, Rfl: 0   ondansetron (ZOFRAN ODT) 4 MG disintegrating tablet, Take 1 tablet (4 mg total) by mouth every 8 (eight) hours as needed for nausea or vomiting., Disp: 20 tablet, Rfl: 0   ondansetron (ZOFRAN) 8 MG tablet, Take 1 tablet (8 mg total) by mouth 2 (two) times daily as needed. Start on the third day after cisplatin chemotherapy., Disp: 30 tablet, Rfl: 1   prochlorperazine (COMPAZINE) 10 MG tablet, Take 1 tablet (10 mg total) by mouth every 6 (six) hours as needed (Nausea or vomiting)., Disp: 30 tablet, Rfl: 1   dexamethasone (DECADRON) 4  MG tablet, Take 2 tablets (8 mg total) by mouth daily. Take daily x 3 days starting the day after cisplatin chemotherapy. Take with food. (Patient not taking: No sig reported), Disp: 30 tablet, Rfl: 1 No current facility-administered medications for this visit.  Facility-Administered Medications Ordered in Other Visits:    heparin lock flush 100 unit/mL, 500 Units, Intravenous, Once, Sindy Guadeloupe, MD  Physical exam:  Vitals:   10/06/21 0838  BP: (!) 139/94  Pulse: 98  Resp: 18  Temp: Marland Kitchen)  96.3 F (35.7 C)  TempSrc: Tympanic  SpO2: 100%  Weight: 155 lb 8 oz (70.5 kg)   Physical Exam Constitutional:      General: She is not in acute distress. Cardiovascular:     Rate and Rhythm: Normal rate and regular rhythm.     Heart sounds: Normal heart sounds.  Pulmonary:     Effort: Pulmonary effort is normal.     Breath sounds: Normal breath sounds.  Abdominal:     General: Bowel sounds are normal.     Palpations: Abdomen is soft.  Skin:    General: Skin is warm and dry.  Neurological:     Mental Status: She is alert and oriented to person, place, and time.     CMP Latest Ref Rng & Units 09/22/2021  Glucose 70 - 99 mg/dL 184(H)  BUN 8 - 23 mg/dL 13  Creatinine 0.44 - 1.00 mg/dL 0.95  Sodium 135 - 145 mmol/L 135  Potassium 3.5 - 5.1 mmol/L 4.2  Chloride 98 - 111 mmol/L 107  CO2 22 - 32 mmol/L 24  Calcium 8.9 - 10.3 mg/dL 8.2(L)  Total Protein 6.5 - 8.1 g/dL 7.6  Total Bilirubin 0.3 - 1.2 mg/dL 0.4  Alkaline Phos 38 - 126 U/L 326(H)  AST 15 - 41 U/L 38  ALT 0 - 44 U/L 22   CBC Latest Ref Rng & Units 10/06/2021  WBC 4.0 - 10.5 K/uL 16.5(H)  Hemoglobin 12.0 - 15.0 g/dL 9.7(L)  Hematocrit 36.0 - 46.0 % 30.6(L)  Platelets 150 - 400 K/uL 231      Assessment and plan- Patient is a 60 y.o. female with stage IV intrahepatic cholangiocarcinoma here for on treatment assessment prior to cycle 6-day 1 of gemcitabine cisplatin chemotherapy  Counts okay to proceed with cycle 6-day  1 of gemcitabine cisplatin chemotherapy today.  She will return in 1 week with port labs and get cycle 6-day 8 of treatment with on pro Neulasta.  I will see her back in 3 weeks for cycle 7-day 1.  Overall tolerating treatment well without any significant side effects.  Plan is to continue treatment until progression or toxicity.  She would be due for repeat scans in December 2022.  Recent scans from September 2022 showed stable disease   Visit Diagnosis 1. Encounter for antineoplastic chemotherapy   2. Intrahepatic cholangiocarcinoma ([place])      Dr. Randa Evens, MD, MPH [facility] at [facility] [phone] 10/06/2021 8:40 AM

## 2021-10-08 ENCOUNTER — Ambulatory Visit: Payer: Self-pay

## 2021-10-08 ENCOUNTER — Telehealth: Payer: Medicare HMO

## 2021-10-08 DIAGNOSIS — E118 Type 2 diabetes mellitus with unspecified complications: Secondary | ICD-10-CM

## 2021-10-08 NOTE — Chronic Care Management (AMB) (Signed)
  Chronic Care Management   CCM RN Visit Note  10/08/2021 Name: Janice Short MRN: [id number] DOB: 06-Apr-1961  Subjective: Janice Short is a 60 y.o. year old female who is a primary care patient of Steele Sizer, MD. The care management team was consulted for assistance with disease management and care coordination needs.    Janice Short was referred to the care management team for assistance with chronic care management and care coordination. Her primary care provider will be notified of our unsuccessful attempts to establish and maintain contact. The care management team will gladly outreach at any time in the future if she is interested in receiving assistance.   PLAN: The care management team will gladly follow up with Janice Short after the primary care provider has a conversation with her regarding recommendation for care management engagement and subsequent re-referral for care management services.   Cristy [facility] Care Management [facility] [phone]

## 2021-10-10 ENCOUNTER — Inpatient Hospital Stay: Payer: Medicare HMO | Attending: Hospice and Palliative Medicine | Admitting: Hospice and Palliative Medicine

## 2021-10-10 DIAGNOSIS — E785 Hyperlipidemia, unspecified: Secondary | ICD-10-CM | POA: Diagnosis not present

## 2021-10-10 DIAGNOSIS — F17210 Nicotine dependence, cigarettes, uncomplicated: Secondary | ICD-10-CM | POA: Insufficient documentation

## 2021-10-10 DIAGNOSIS — I10 Essential (primary) hypertension: Secondary | ICD-10-CM | POA: Insufficient documentation

## 2021-10-10 DIAGNOSIS — E875 Hyperkalemia: Secondary | ICD-10-CM | POA: Insufficient documentation

## 2021-10-10 DIAGNOSIS — E1169 Type 2 diabetes mellitus with other specified complication: Secondary | ICD-10-CM | POA: Diagnosis not present

## 2021-10-10 DIAGNOSIS — E119 Type 2 diabetes mellitus without complications: Secondary | ICD-10-CM | POA: Insufficient documentation

## 2021-10-10 DIAGNOSIS — Z90710 Acquired absence of both cervix and uterus: Secondary | ICD-10-CM | POA: Insufficient documentation

## 2021-10-10 DIAGNOSIS — C221 Intrahepatic bile duct carcinoma: Secondary | ICD-10-CM | POA: Insufficient documentation

## 2021-10-10 MED ORDER — ATORVASTATIN CALCIUM 40 MG PO TABS: 40.0000 mg | ORAL_TABLET | Freq: Every day | ORAL | 1 refills | Status: DC

## 2021-10-10 NOTE — Addendum Note (Signed)
Addended by: Altha Harm R on: 10/10/2021 10:06 AM   Modules accepted: Orders

## 2021-10-10 NOTE — Progress Notes (Signed)
Virtual Visit via Telephone Note  I connected with Janice Short on 10/10/21 at 10:30 AM EDT by telephone and verified that I am speaking with the correct person using two identifiers.  Location: Patient: Home Provider: Clinic   I discussed the limitations, risks, security and privacy concerns of performing an evaluation and management service by telephone and the availability of in person appointments. I also discussed with the patient that there may be a patient responsible charge related to this service. The patient expressed understanding and agreed to proceed.   History of Present Illness: Janice Short is a 60 y.o. female with multiple medical problems including hypertension, hyperlipidemia, and type 2 diabetes, who was recently diagnosed with cholangiocarcinoma.  Patient was initially unsure regarding treatment options.  She was referred to palliative care to help address goals.   Observations/Objective: Spoke with patient on phone.  She reports that she is doing well.  She denies any significant changes or concerns.  No symptomatic complaints at present.  Patient reports her appetite has improved.  Weight down trended slightly in October but patient feels that her oral intake is adequate.  Assessment and Plan: Stage IV cholangiocarcinoma -on systemic chemotherapy.  Seems to be doing well clinically without any significant symptomatic burden at present.  Will follow.  Follow Up Instructions: Follow-up telephone visit 1 to 2 months   I discussed the assessment and treatment plan with the patient. The patient was provided an opportunity to ask questions and all were answered. The patient agreed with the plan and demonstrated an understanding of the instructions.   The patient was advised to call back or seek an in-person evaluation if the symptoms worsen or if the condition fails to improve as anticipated.  I provided 5 minutes of non-face-to-face time during this encounter.   Irean Hong, NP

## 2021-10-16 ENCOUNTER — Other Ambulatory Visit: Payer: Self-pay | Admitting: Family Medicine

## 2021-10-16 DIAGNOSIS — E118 Type 2 diabetes mellitus with unspecified complications: Secondary | ICD-10-CM

## 2021-10-21 ENCOUNTER — Inpatient Hospital Stay: Payer: Medicare HMO

## 2021-10-21 NOTE — Progress Notes (Signed)
Nutrition Follow-up:  Patient with stage IV intrahepatic cholangiocarcinoma.  Patient receiving cisplatin and gemcitabine.    Spoke with patient via phone for nutrition follow-up.  Patient reports that she has a good appetite.  "I just got done eating a tenderloin biscuit, hashbrown, juice and coffee."  Last night for supper ate mashed potatoes and salisbury steak.  Lunch was pizza, sometimes has sandwich.  Has boost shakes but does not drink them as they fill her up.  Denies nausea, constipation or diarrhea.      Medications: reviewed  Labs: reviewed  Anthropometrics:   Weight 155 lb 8 oz on 10/31  164 lb on 10/17 (?? Accuracy) 142 lb 11.2 oz on 9/19 155 lb 11.2 oz on 5/22   NUTRITION DIAGNOSIS: Inadequate oral intake improving   INTERVENTION:  Encouraged patient to eat well balanced diet including good sources protein for weight maintenance.      MONITORING, EVALUATION, GOAL: weight trends, intake   NEXT VISIT: phone visit on Tuesday, Dec 27   Jahaan Vanwagner B. Zenia Resides, [place], [place] Registered Dietitian [phone] (mobile)

## 2021-10-27 ENCOUNTER — Inpatient Hospital Stay: Payer: Medicare HMO

## 2021-10-27 ENCOUNTER — Other Ambulatory Visit: Payer: Self-pay

## 2021-10-27 ENCOUNTER — Inpatient Hospital Stay (HOSPITAL_BASED_OUTPATIENT_CLINIC_OR_DEPARTMENT_OTHER): Payer: Medicare HMO | Admitting: Oncology

## 2021-10-27 ENCOUNTER — Other Ambulatory Visit: Payer: Self-pay | Admitting: *Deleted

## 2021-10-27 ENCOUNTER — Encounter: Payer: Self-pay | Admitting: *Deleted

## 2021-10-27 VITALS — BP 152/83 | HR 90 | Temp 98.4°F | Resp 16 | Ht 67.0 in | Wt 158.3 lb

## 2021-10-27 DIAGNOSIS — R69 Illness, unspecified: Secondary | ICD-10-CM | POA: Diagnosis not present

## 2021-10-27 DIAGNOSIS — E875 Hyperkalemia: Secondary | ICD-10-CM

## 2021-10-27 DIAGNOSIS — Z5111 Encounter for antineoplastic chemotherapy: Secondary | ICD-10-CM

## 2021-10-27 DIAGNOSIS — C221 Intrahepatic bile duct carcinoma: Secondary | ICD-10-CM | POA: Diagnosis not present

## 2021-10-27 DIAGNOSIS — I10 Essential (primary) hypertension: Secondary | ICD-10-CM | POA: Diagnosis not present

## 2021-10-27 DIAGNOSIS — F17210 Nicotine dependence, cigarettes, uncomplicated: Secondary | ICD-10-CM | POA: Diagnosis not present

## 2021-10-27 DIAGNOSIS — E119 Type 2 diabetes mellitus without complications: Secondary | ICD-10-CM | POA: Diagnosis not present

## 2021-10-27 DIAGNOSIS — Z90710 Acquired absence of both cervix and uterus: Secondary | ICD-10-CM | POA: Diagnosis not present

## 2021-10-27 LAB — COMPREHENSIVE METABOLIC PANEL
ALT: 28 U/L (ref 0–44)
AST: 46 U/L — ABNORMAL HIGH (ref 15–41)
Albumin: 2.9 g/dL — ABNORMAL LOW (ref 3.5–5.0)
Alkaline Phosphatase: 426 U/L — ABNORMAL HIGH (ref 38–126)
Anion gap: 4 — ABNORMAL LOW (ref 5–15)
BUN: 19 mg/dL (ref 8–23)
CO2: 22 mmol/L (ref 22–32)
Calcium: 8.1 mg/dL — ABNORMAL LOW (ref 8.9–10.3)
Chloride: 111 mmol/L (ref 98–111)
Creatinine, Ser: 1.21 mg/dL — ABNORMAL HIGH (ref 0.44–1.00)
GFR, Estimated: 51 mL/min — ABNORMAL LOW (ref >60)
Glucose, Bld: 180 mg/dL — ABNORMAL HIGH (ref 70–99)
Potassium: 5.8 mmol/L — ABNORMAL HIGH (ref 3.5–5.1)
Sodium: 137 mmol/L (ref 135–145)
Total Bilirubin: 0.8 mg/dL (ref 0.3–1.2)
Total Protein: 7.8 g/dL (ref 6.5–8.1)

## 2021-10-27 LAB — CBC WITH DIFFERENTIAL/PLATELET
Abs Immature Granulocytes: 0.02 10*3/uL (ref 0.00–0.07)
Basophils Absolute: 0.1 10*3/uL (ref 0.0–0.1)
Basophils Relative: 1 %
Eosinophils Absolute: 0.3 10*3/uL (ref 0.0–0.5)
Eosinophils Relative: 4 %
HCT: 28.5 % — ABNORMAL LOW (ref 36.0–46.0)
Hemoglobin: 9.2 g/dL — ABNORMAL LOW (ref 12.0–15.0)
Immature Granulocytes: 0 %
Lymphocytes Relative: 20 %
Lymphs Abs: 1.7 10*3/uL (ref 0.7–4.0)
MCH: 28.8 pg (ref 26.0–34.0)
MCHC: 32.3 g/dL (ref 30.0–36.0)
MCV: 89.3 fL (ref 80.0–100.0)
Monocytes Absolute: 0.9 10*3/uL (ref 0.1–1.0)
Monocytes Relative: 11 %
Neutro Abs: 5.5 10*3/uL (ref 1.7–7.7)
Neutrophils Relative %: 64 %
Platelets: 354 10*3/uL (ref 150–400)
RBC: 3.19 MIL/uL — ABNORMAL LOW (ref 3.87–5.11)
RDW: 23.5 % — ABNORMAL HIGH (ref 11.5–15.5)
Smear Review: NORMAL
WBC: 8.6 10*3/uL (ref 4.0–10.5)
nRBC: 0 % (ref 0.0–0.2)

## 2021-10-27 LAB — BASIC METABOLIC PANEL
Anion gap: 3 — ABNORMAL LOW (ref 5–15)
BUN: 20 mg/dL (ref 8–23)
CO2: 21 mmol/L — ABNORMAL LOW (ref 22–32)
Calcium: 8.1 mg/dL — ABNORMAL LOW (ref 8.9–10.3)
Chloride: 113 mmol/L — ABNORMAL HIGH (ref 98–111)
Creatinine, Ser: 1.26 mg/dL — ABNORMAL HIGH (ref 0.44–1.00)
GFR, Estimated: 48 mL/min — ABNORMAL LOW (ref >60)
Glucose, Bld: 155 mg/dL — ABNORMAL HIGH (ref 70–99)
Potassium: 6.2 mmol/L — ABNORMAL HIGH (ref 3.5–5.1)
Sodium: 137 mmol/L (ref 135–145)

## 2021-10-27 LAB — MAGNESIUM: Magnesium: 2.3 mg/dL (ref 1.7–2.4)

## 2021-10-27 MED ORDER — HEPARIN SOD (PORK) LOCK FLUSH 100 UNIT/ML IV SOLN
INTRAVENOUS | Status: AC
  Administered 2021-10-27: 500 [IU]
  Filled 2021-10-27: qty 5

## 2021-10-27 MED ORDER — LOKELMA 10 G PO PACK: 10.0000 g | PACK | Freq: Every day | ORAL | 1 refills | Status: DC

## 2021-10-27 MED ORDER — SODIUM CHLORIDE 0.9% FLUSH
10.0000 mL | Freq: Once | INTRAVENOUS | Status: AC
  Administered 2021-10-27: 10 mL via INTRAVENOUS
  Filled 2021-10-27: qty 10

## 2021-10-27 MED ORDER — VELTASSA 8.4 G PO PACK: 8.4000 g | PACK | Freq: Every day | ORAL | 0 refills | Status: DC

## 2021-10-27 NOTE — Progress Notes (Signed)
Met

## 2021-10-27 NOTE — Progress Notes (Signed)
Per MD to redraw BMP prior to treatment  BMP resulted at 1045. Potassium 6.2. MD and treatment team notified. Per MD to hold chemotherapy treatment today and recommend that she goes to the ER for further evaluation. Pt and treatment team updated. Pt states she "does not want to go to the ER and wait." Vitals are stable. Pt states "I feel fine." MD made aware. Per MD pt needs to stop taking Losartan, take Veltassa 1 tab a day, refer pt to nephrologist, and schedule pt to have labs redrawn on 10/29/21. RN printed and handed  AVS with updated appointments to pt. Pt made aware that someone will contact her with nephrology referral. Pt states she will start taking the veltassa today. RN educated pt on the importance of notifying the clinic if any complications occur at home and when to seek emergency care. Pt verbalized understanding and all questions answered at this time. Vitals stable. Pt stable for discharge.   Janice Short [employer]

## 2021-10-27 NOTE — Progress Notes (Signed)
Hematology/Oncology Consult note [facility]  [phone] [phone]  Patient Care Team: Steele Sizer, MD as PCP - General (Family Medicine) Clent Jacks, RN as Oncology Nurse Navigator [place], [place], LCSW as Social Worker Germaine Pomfret, [facility] (Pharmacist)   Name of the patient: Janice Short  [id number]  [birth year]   Date of visit: 10/27/21  Diagnosis- metastatic intrahepatic cholangiocarcinoma  Chief complaint/ Reason for visit-on treatment assessment prior to cycle 7-day 1 of gemcitabine cisplatin chemotherapy  Heme/Onc history: Patient is a 60 year old African-American female with a past medical history significant for hypertension hyperlipidemia and type 2 diabetes who presented to the ER with symptoms of cramping abdominal pain and nausea.  This was followed by right upper quadrant ultrasound which showed multiple echogenic masses in the liver with nodular contour of the liver possibly suggestive of cirrhosis.  Several nodular masses in the vicinity of the pancreatic head and porta hepatic lymph nodes.  This was followed by an MRI abdomen and MRCP.  It showed right hepatic lobe masslike area measuring 4.9 x 2.9 cm.  Diffusion abnormality in the left hepatic lobe with mild left-sided biliary ductal dilatation and another area of abnormality measuring 1.4 x 1 cm.  Bulky celiac adenopathy 3 x 2.1 cm tracking into gastrohepatic recess.  Suspected portal venous invasion with convex protrusion in the right portal vein.  Left adrenal lesion measuring 2.9 x 1.5 cm.  Findings concerning for cholangiocarcinoma or biphenotypic cholangiole hepatocellular neoplasm.     Patient underwent CT-guided liver biopsy Which was consistent with adenocarcinoma poorly differentiated.  Immunohistochemistry showed CK7 positivity, CDX2 negative, Heppar negative, GATA3 negative.  CK19 positive.  Differentials include cholangiocarcinoma, pancreatic, upper GI.   Lung and breast less likely.  However given CK19 positivity most compatible with cholangiocarcinoma.   Patient's case was discussed at tumor board and it was felt as if the lymphadenopathy was more like a confluent primary tumor mass.  Patient was referred to [facility] for second opinion to see if she would be a candidate for surgery.However upon their review of the PET scan it was deemed that this was indeed periportal adenopathy.  They also did a CT chest abdomen and pelvis with MIPS protocol and she was also found to have portocaval lymph node measuring 2.4 cm which was more consistent with metastatic disease.  She was deemed unresectable   NGS testing showed BRCA2, FGFR2, PIK3CA, RAD 54L, CDC73, CDKN2 A/B CR EBBP rearrangement exon 31    Patient has been getting gemcitabine cisplatin chemotherapy 2 weeks on 1 week off since August 2022  Interval history-patient did not come for cycle 6-day 15 of gemcitabine cisplatin chemotherapy.  Presently she reports doing well and denies any specific complaints at this time  ECOG PS- 1 Pain scale- 0 Opioid associated constipation- no  Review of systems- Review of Systems  Constitutional:  Positive for malaise/fatigue. Negative for chills, fever and weight loss.  HENT:  Negative for congestion, ear discharge and nosebleeds.   Eyes:  Negative for blurred vision.  Respiratory:  Negative for cough, hemoptysis, sputum production, shortness of breath and wheezing.   Cardiovascular:  Negative for chest pain, palpitations, orthopnea and claudication.  Gastrointestinal:  Negative for abdominal pain, blood in stool, constipation, diarrhea, heartburn, melena, nausea and vomiting.  Genitourinary:  Negative for dysuria, flank pain, frequency, hematuria and urgency.  Musculoskeletal:  Negative for back pain, joint pain and myalgias.  Skin:  Negative for rash.  Neurological:  Negative for dizziness,  tingling, focal weakness, seizures, weakness and headaches.   Endo/Heme/Allergies:  Does not bruise/bleed easily.  Psychiatric/Behavioral:  Negative for depression and suicidal ideas. The patient does not have insomnia.       Allergies  Allergen Reactions   Other Shortness Of Breath    Cat dander and grass pollen   Bydureon [Exenatide] Other (See Comments)    Pain with injection not allergy     Past Medical History:  Diagnosis Date   Allergy    Asthma    Carpal tunnel syndrome on left    Cervical radiculitis    Chronic osteoarthritis    Corns and callosity    Depression    Dermatophytosis of foot    Diabetes mellitus without complication (HCC)    Gout    Hyperlipidemia    Hypertension    Impingement syndrome of left shoulder    Intrahepatic cholangiocarcinoma (HCC)    Lumbosacral neuritis    Microalbuminuria    Obesity    Supraventricular tachycardia ([place]) 07/01/2015   SVT (supraventricular tachycardia) (HCC)    Tenosynovitis of wrist    Vitamin D deficiency      Past Surgical History:  Procedure Laterality Date   ABDOMINAL HYSTERECTOMY     COLONOSCOPY WITH PROPOFOL N/A 05/06/2021   Procedure: COLONOSCOPY WITH PROPOFOL;  Surgeon: Lucilla Lame, MD;  Location: [facility] ENDOSCOPY;  Service: Endoscopy;  Laterality: N/A;   ESOPHAGOGASTRODUODENOSCOPY (EGD) WITH PROPOFOL N/A 05/06/2021   Procedure: ESOPHAGOGASTRODUODENOSCOPY (EGD) WITH PROPOFOL;  Surgeon: Lucilla Lame, MD;  Location: [facility] ENDOSCOPY;  Service: Endoscopy;  Laterality: N/A;   PORTA CATH INSERTION N/A 07/17/2021   Procedure: PORTA CATH INSERTION;  Surgeon: Algernon Huxley, MD;  Location: [place] [facility];  Service: Cardiovascular;  Laterality: N/A;    Social History   Socioeconomic History   Marital status: Single    Spouse name: Not on file   Number of children: 2   Years of education: Not on file   Highest education level: Not on file  Occupational History   Occupation: disable     Comment: from chronic back pain, 2019  Tobacco Use   Smoking status: Some Days     Types: Cigarettes   Smokeless tobacco: Never   Tobacco comments:    2 per day 01/30/21  Vaping Use   Vaping Use: Never used  Substance and Sexual Activity   Alcohol use: Yes    Alcohol/week: 0.0 standard drinks    Comment: rare maybe 1-2  year   Drug use: No   Sexual activity: Yes  Other Topics Concern   Not on file  Social History Narrative   She is on disability for chronic back pain. Pt's son lives with her   Social Determinants of Health   Financial Resource Strain: Medium Risk   Difficulty of Paying Living Expenses: Somewhat hard  Food Insecurity: No Food Insecurity   Worried About Charity fundraiser in the Last Year: Never true   Arboriculturist in the Last Year: Never true  Transportation Needs: No Transportation Needs   Lack of Transportation (Medical): No   Lack of Transportation (Non-Medical): No  Physical Activity: Inactive   Days of Exercise per Week: 0 days   Minutes of Exercise per Session: 0 min  Stress: Stress Concern Present   Feeling of Stress : To some extent  Social Connections: Moderately Isolated   Frequency of Communication with Friends and Family: More than three times a week   Frequency of Social Gatherings  with Friends and Family: Three times a week   Attends Religious Services: More than 4 times per year   Active Member of Clubs or Organizations: No   Attends Archivist Meetings: Never   Marital Status: Never married  Human resources officer Violence: Not At Risk   Fear of Current or Ex-Partner: No   Emotionally Abused: No   Physically Abused: No   Sexually Abused: No    Family History  Problem Relation Age of Onset   Heart attack Mother    CAD Mother    Kidney disease Father      Current Outpatient Medications:    albuterol (VENTOLIN HFA) 108 (90 Base) MCG/ACT inhaler, INHALE 2 PUFFS BY MOUTH EVERY 6 HOURS AS NEEDED FOR WHEEZING AND FOR SHORTNESS OF BREATH, Disp: 18 g, Rfl: 1   aspirin 81 MG tablet, Take 1 tablet by mouth daily.,  Disp: , Rfl:    atorvastatin (LIPITOR) 40 MG tablet, Take 1 tablet (40 mg total) by mouth at bedtime., Disp: 90 tablet, Rfl: 1   blood glucose meter kit and supplies, Dispense based on patient and insurance preference. Use up to four times daily as directed. (FOR ICD-10 E10.9, E11.9)., Disp: 1 each, Rfl: 0   diphenhydramine-acetaminophen (TYLENOL PM) 25-500 MG TABS tablet, Take 1 tablet by mouth at bedtime as needed., Disp: , Rfl:    escitalopram (LEXAPRO) 20 MG tablet, Take 1 tablet (20 mg total) by mouth daily., Disp: 90 tablet, Rfl: 1   fluticasone (FLONASE) 50 MCG/ACT nasal spray, USE 2 SPRAY(S) IN EACH NOSTRIL AT BEDTIME (Patient taking differently: Place 2 sprays into both nostrils daily as needed. USE 2 SPRAY(S) IN EACH NOSTRIL AT BEDTIME), Disp: 16 g, Rfl: 0   Fluticasone-Umeclidin-Vilant (TRELEGY ELLIPTA) 100-62.5-25 MCG/INH AEPB, Take 1 puff by mouth daily., Disp: 180 each, Rfl: 1   gabapentin (NEURONTIN) 300 MG capsule, Take 2 capsules (600 mg total) by mouth 2 (two) times daily., Disp: 360 capsule, Rfl: 1   Insulin Pen Needle 31G X 8 MM MISC, 1 each by Does not apply route daily., Disp: 100 each, Rfl: 1   losartan (COZAAR) 25 MG tablet, Take 1 tablet (25 mg total) by mouth daily., Disp: 90 tablet, Rfl: 0   TRESIBA FLEXTOUCH 100 UNIT/ML FlexTouch Pen, INJECT 12-30 UNITS OF INSULIN INTO THE SKIN DAILY TO KEEP FASTING GLUCOSE BETWEEN 100-140, Disp: 15 mL, Rfl: 0   dexamethasone (DECADRON) 4 MG tablet, Take 2 tablets (8 mg total) by mouth daily. Take daily x 3 days starting the day after cisplatin chemotherapy. Take with food. (Patient not taking: Reported on 09/19/2021), Disp: 30 tablet, Rfl: 1   ondansetron (ZOFRAN ODT) 4 MG disintegrating tablet, Take 1 tablet (4 mg total) by mouth every 8 (eight) hours as needed for nausea or vomiting. (Patient not taking: Reported on 10/27/2021), Disp: 20 tablet, Rfl: 0   ondansetron (ZOFRAN) 8 MG tablet, Take 1 tablet (8 mg total) by mouth 2 (two) times  daily as needed. Start on the third day after cisplatin chemotherapy. (Patient not taking: Reported on 10/27/2021), Disp: 30 tablet, Rfl: 1   patiromer (VELTASSA) 8.4 g packet, Take 1 packet (8.4 g total) by mouth daily., Disp: 3 each, Rfl: 0   prochlorperazine (COMPAZINE) 10 MG tablet, Take 1 tablet (10 mg total) by mouth every 6 (six) hours as needed (Nausea or vomiting). (Patient not taking: Reported on 10/27/2021), Disp: 30 tablet, Rfl: 1  Physical exam:  Vitals:   10/27/21 0855  BP: (!) 152/83  Pulse: 90  Resp: 16  Temp: 98.4 F (36.9 C)  TempSrc: Oral  Weight: 158 lb 4.8 oz (71.8 kg)  Height: 5' 7"  (1.702 m)   Physical Exam Constitutional:      General: She is not in acute distress. Cardiovascular:     Rate and Rhythm: Normal rate and regular rhythm.     Heart sounds: Normal heart sounds.  Pulmonary:     Effort: Pulmonary effort is normal.     Breath sounds: Normal breath sounds.  Abdominal:     General: Bowel sounds are normal.     Palpations: Abdomen is soft.  Skin:    General: Skin is warm and dry.  Neurological:     Mental Status: She is alert and oriented to person, place, and time.     CMP Latest Ref Rng & Units 10/27/2021  Glucose 70 - 99 mg/dL 155(H)  BUN 8 - 23 mg/dL 20  Creatinine 0.44 - 1.00 mg/dL 1.26(H)  Sodium 135 - 145 mmol/L 137  Potassium 3.5 - 5.1 mmol/L 6.2(H)  Chloride 98 - 111 mmol/L 113(H)  CO2 22 - 32 mmol/L 21(L)  Calcium 8.9 - 10.3 mg/dL 8.1(L)  Total Protein 6.5 - 8.1 g/dL -  Total Bilirubin 0.3 - 1.2 mg/dL -  Alkaline Phos 38 - 126 U/L -  AST 15 - 41 U/L -  ALT 0 - 44 U/L -   CBC Latest Ref Rng & Units 10/27/2021  WBC 4.0 - 10.5 K/uL 8.6  Hemoglobin 12.0 - 15.0 g/dL 9.2(L)  Hematocrit 36.0 - 46.0 % 28.5(L)  Platelets 150 - 400 K/uL 354    Assessment and plan- Patient is a 60 y.o. female with stage IV intrahepatic cholangiocarcinoma.  She is here for on treatment assessment prior to cycle 7-day 1 of gemcitabine cisplatin  chemotherapy  Patient's potassium levels are elevated on 2 separate occasions today at 5.8 and 6.2 respectively.  I did recommend that patient should go to the emergency room due to her significant hyperkalemia that can lead to cardiac arrhythmias and potential death.  However patient has refused to go to the ER. Plan is therefore to send her a prescription of days or 1 tablet daily.  Arrange urgent referral to nephrology for future management of her hyperkalemia.  We have asked her to hold off on taking her losartan.  Repeat BMP on 10/29/2021.  Hold off on giving chemotherapy today.  Chemo will be pushed out by 1 week.  She will be seen by covering NP on 12/03/2021 and received cycle 7-day 1 of gemcitabine cisplatin chemotherapy which she gets 2 weeks on and 1 week off.  Scans will be repeated sometime end of December 2022.  Also moving forward we will hold off on adding any extra potassium to her pre and post cisplatin IV fluids   Visit Diagnosis 1. Hyperkalemia   2. Intrahepatic cholangiocarcinoma ([place])      Dr. Randa Evens, MD, MPH [facility] at [facility] [phone] 10/27/2021 12:04 PM

## 2021-10-27 NOTE — Progress Notes (Signed)
Pt doing ok, no concerns. She does not have steroids that she is suppose to have after chemo of cisplatin

## 2021-10-29 ENCOUNTER — Encounter: Payer: Self-pay | Admitting: Emergency Medicine

## 2021-10-29 ENCOUNTER — Telehealth: Payer: Self-pay | Admitting: *Deleted

## 2021-10-29 ENCOUNTER — Emergency Department
Admission: EM | Admit: 2021-10-29 | Discharge: 2021-10-30 | Disposition: A | Discharge status: Home / Self Care | Payer: Medicare HMO | Attending: Emergency Medicine | Admitting: Emergency Medicine

## 2021-10-29 ENCOUNTER — Other Ambulatory Visit: Payer: Self-pay

## 2021-10-29 ENCOUNTER — Inpatient Hospital Stay: Payer: Medicare HMO

## 2021-10-29 DIAGNOSIS — J4520 Mild intermittent asthma, uncomplicated: Secondary | ICD-10-CM | POA: Insufficient documentation

## 2021-10-29 DIAGNOSIS — E875 Hyperkalemia: Secondary | ICD-10-CM

## 2021-10-29 DIAGNOSIS — E785 Hyperlipidemia, unspecified: Secondary | ICD-10-CM | POA: Insufficient documentation

## 2021-10-29 DIAGNOSIS — Z7982 Long term (current) use of aspirin: Secondary | ICD-10-CM | POA: Diagnosis not present

## 2021-10-29 DIAGNOSIS — E1142 Type 2 diabetes mellitus with diabetic polyneuropathy: Secondary | ICD-10-CM | POA: Insufficient documentation

## 2021-10-29 DIAGNOSIS — I10 Essential (primary) hypertension: Secondary | ICD-10-CM | POA: Diagnosis not present

## 2021-10-29 DIAGNOSIS — F17210 Nicotine dependence, cigarettes, uncomplicated: Secondary | ICD-10-CM | POA: Insufficient documentation

## 2021-10-29 DIAGNOSIS — R69 Illness, unspecified: Secondary | ICD-10-CM | POA: Diagnosis not present

## 2021-10-29 DIAGNOSIS — R000 Tachycardia, unspecified: Secondary | ICD-10-CM | POA: Diagnosis not present

## 2021-10-29 DIAGNOSIS — E1169 Type 2 diabetes mellitus with other specified complication: Secondary | ICD-10-CM | POA: Diagnosis not present

## 2021-10-29 LAB — CBC
HCT: 27.1 % — ABNORMAL LOW (ref 36.0–46.0)
Hemoglobin: 9.1 g/dL — ABNORMAL LOW (ref 12.0–15.0)
MCH: 29.6 pg (ref 26.0–34.0)
MCHC: 33.6 g/dL (ref 30.0–36.0)
MCV: 88.3 fL (ref 80.0–100.0)
Platelets: 307 10*3/uL (ref 150–400)
RBC: 3.07 MIL/uL — ABNORMAL LOW (ref 3.87–5.11)
RDW: 23.9 % — ABNORMAL HIGH (ref 11.5–15.5)
WBC: 9.8 10*3/uL (ref 4.0–10.5)
nRBC: 0 % (ref 0.0–0.2)

## 2021-10-29 LAB — MAGNESIUM: Magnesium: 2.1 mg/dL (ref 1.7–2.4)

## 2021-10-29 LAB — BASIC METABOLIC PANEL
Anion gap: 5 (ref 5–15)
Anion gap: 4 — ABNORMAL LOW (ref 5–15)
BUN: 20 mg/dL (ref 8–23)
BUN: 19 mg/dL (ref 8–23)
CO2: 21 mmol/L — ABNORMAL LOW (ref 22–32)
CO2: 20 mmol/L — ABNORMAL LOW (ref 22–32)
Calcium: 8.3 mg/dL — ABNORMAL LOW (ref 8.9–10.3)
Calcium: 8.2 mg/dL — ABNORMAL LOW (ref 8.9–10.3)
Chloride: 109 mmol/L (ref 98–111)
Chloride: 110 mmol/L (ref 98–111)
Creatinine, Ser: 1.29 mg/dL — ABNORMAL HIGH (ref 0.44–1.00)
Creatinine, Ser: 1.22 mg/dL — ABNORMAL HIGH (ref 0.44–1.00)
GFR, Estimated: 47 mL/min — ABNORMAL LOW (ref >60)
GFR, Estimated: 50 mL/min — ABNORMAL LOW (ref >60)
Glucose, Bld: 101 mg/dL — ABNORMAL HIGH (ref 70–99)
Glucose, Bld: 109 mg/dL — ABNORMAL HIGH (ref 70–99)
Potassium: 6.4 mmol/L (ref 3.5–5.1)
Potassium: 6.1 mmol/L — ABNORMAL HIGH (ref 3.5–5.1)
Sodium: 135 mmol/L (ref 135–145)
Sodium: 134 mmol/L — ABNORMAL LOW (ref 135–145)

## 2021-10-29 LAB — TROPONIN I (HIGH SENSITIVITY)
Troponin I (High Sensitivity): 9 ng/L (ref <18)
Troponin I (High Sensitivity): 7 ng/L (ref <18)

## 2021-10-29 LAB — CBG MONITORING, ED: Glucose-Capillary: 70 mg/dL (ref 70–99)

## 2021-10-29 MED ORDER — SODIUM CHLORIDE 0.9 % IV BOLUS
1000.0000 mL | Freq: Once | INTRAVENOUS | Status: AC
  Administered 2021-10-29: 1000 mL via INTRAVENOUS

## 2021-10-29 MED ORDER — FUROSEMIDE 10 MG/ML IJ SOLN
20.0000 mg | Freq: Once | INTRAMUSCULAR | Status: AC
  Administered 2021-10-29: 20 mg via INTRAVENOUS
  Filled 2021-10-29: qty 4

## 2021-10-29 MED ORDER — SODIUM ZIRCONIUM CYCLOSILICATE 10 G PO PACK
10.0000 g | PACK | Freq: Once | ORAL | Status: AC
  Administered 2021-10-29: 10 g via ORAL
  Filled 2021-10-29: qty 1

## 2021-10-29 MED ORDER — DEXTROSE 50 % IV SOLN
1.0000 | Freq: Once | INTRAVENOUS | Status: AC
  Administered 2021-10-29: 50 mL via INTRAVENOUS
  Filled 2021-10-29: qty 50

## 2021-10-29 MED ORDER — INSULIN ASPART 100 UNIT/ML IJ SOLN
5.0000 [IU] | Freq: Once | INTRAMUSCULAR | Status: AC
  Administered 2021-10-29: 5 [IU] via INTRAVENOUS
  Filled 2021-10-29: qty 1

## 2021-10-29 NOTE — ED Provider Notes (Signed)
[facility] Emergency Department Provider Note  ____________________________________________   Event Date/Time   First MD Initiated Contact with Patient 10/29/21 1504     (approximate)  I have reviewed the triage vital signs and the nursing notes.   HISTORY  Chief Complaint Abnormal Lab    HPI Janice Short is a 60 y.o. female  with PMhx HTN, HLD, DVT,cholangiocarcinoma, here with hyperkalemia. Pt sent in by Oncologist for K 6.2 on outside labs. Per her report, she has been otherwise well. She had routine labs drawn at her Oncologist and was noted to be hyperkalemic. She has had some mild indigestion but this is a chronic issue. No acute change. No n/v/d. No CP, SOB. No palpitations, syncope, or weakness. She does state she stopped her losartan a week ago. Her Oncologist called in [place] but did not take it yet. No other complaints.        Past Medical History:  Diagnosis Date   Allergy    Asthma    Carpal tunnel syndrome on left    Cervical radiculitis    Chronic osteoarthritis    Corns and callosity    Depression    Dermatophytosis of foot    Diabetes mellitus without complication (HCC)    Gout    Hyperlipidemia    Hypertension    Impingement syndrome of left shoulder    Intrahepatic cholangiocarcinoma (HCC)    Lumbosacral neuritis    Microalbuminuria    Obesity    Supraventricular tachycardia (HCC) 07/01/2015   SVT (supraventricular tachycardia) (HCC)    Tenosynovitis of wrist    Vitamin D deficiency     Patient Active Problem List   Diagnosis Date Noted   Rectal polyp    Polyp of sigmoid colon    Abnormal findings on diagnostic imaging of digestive system    Gastritis with hemorrhage    Polyp of duodenum    Intrahepatic cholangiocarcinoma (HCC) 04/12/2021   Goals of care, counseling/discussion 04/12/2021   Mild episode of recurrent major depressive disorder (HCC) 02/08/2019   Dyslipidemia associated with type 2 diabetes mellitus  (HCC) 12/17/2017   Noncompliance with medication regimen 12/17/2017   Spinal stenosis 10/15/2016   Diabetic polyneuropathy associated with type 2 diabetes mellitus (HCC) 07/14/2016   Chronic midline low back pain with sciatica 06/22/2016   Primary osteoarthritis of both knees 06/22/2016   Carpal tunnel syndrome 07/01/2015   Cervical radiculitis 07/01/2015   Corn or callus 07/01/2015   Impingement syndrome of shoulder 07/01/2015   Microalbuminuria 07/01/2015   Compulsive tobacco user syndrome 07/01/2015   Tenosynovitis of wrist 07/01/2015   Benign hypertension 11/05/2009   Athlete's foot 08/05/2009   Vitamin D deficiency 01/08/2009   Asthma, mild intermittent 12/28/2008   Allergic rhinitis 12/28/2008   Lumbosacral neuritis 08/11/2007    Past Surgical History:  Procedure Laterality Date   ABDOMINAL HYSTERECTOMY     COLONOSCOPY WITH PROPOFOL N/A 05/06/2021   Procedure: COLONOSCOPY WITH PROPOFOL;  Surgeon: Midge Minium, MD;  Location: [facility] ENDOSCOPY;  Service: Endoscopy;  Laterality: N/A;   ESOPHAGOGASTRODUODENOSCOPY (EGD) WITH PROPOFOL N/A 05/06/2021   Procedure: ESOPHAGOGASTRODUODENOSCOPY (EGD) WITH PROPOFOL;  Surgeon: Midge Minium, MD;  Location: [facility] ENDOSCOPY;  Service: Endoscopy;  Laterality: N/A;   PORTA CATH INSERTION N/A 07/17/2021   Procedure: PORTA CATH INSERTION;  Surgeon: Annice Needy, MD;  Location: [facility] INVASIVE CV LAB;  Service: Cardiovascular;  Laterality: N/A;    Prior to Admission medications   Medication Sig Start Date End Date Taking? Authorizing  Provider  albuterol (VENTOLIN HFA) 108 (90 Base) MCG/ACT inhaler INHALE 2 PUFFS BY MOUTH EVERY 6 HOURS AS NEEDED FOR WHEEZING AND FOR SHORTNESS OF BREATH 02/15/20   Steele Sizer, MD  aspirin 81 MG tablet Take 1 tablet by mouth daily. 11/05/09   [provider]  atorvastatin (LIPITOR) 40 MG tablet Take 1 tablet (40 mg total) by mouth at bedtime. 10/10/21   Borders, Kirt Boys, NP  blood glucose meter kit and supplies  Dispense based on patient and insurance preference. Use up to four times daily as directed. (FOR ICD-10 E10.9, E11.9). 04/29/21   Steele Sizer, MD  dexamethasone (DECADRON) 4 MG tablet Take 2 tablets (8 mg total) by mouth daily. Take daily x 3 days starting the day after cisplatin chemotherapy. Take with food. Patient not taking: Reported on 09/19/2021 05/22/21   Sindy Guadeloupe, MD  diphenhydramine-acetaminophen (TYLENOL PM) 25-500 MG TABS tablet Take 1 tablet by mouth at bedtime as needed.    [provider]  escitalopram (LEXAPRO) 20 MG tablet Take 1 tablet (20 mg total) by mouth daily. 09/19/21   Steele Sizer, MD  fluticasone (FLONASE) 50 MCG/ACT nasal spray USE 2 SPRAY(S) IN EACH NOSTRIL AT BEDTIME Patient taking differently: Place 2 sprays into both nostrils daily as needed. USE 2 SPRAY(S) IN EACH NOSTRIL AT BEDTIME 02/19/21   Steele Sizer, MD  Fluticasone-Umeclidin-Vilant (TRELEGY ELLIPTA) 100-62.5-25 MCG/INH AEPB Take 1 puff by mouth daily. 09/19/21   Steele Sizer, MD  gabapentin (NEURONTIN) 300 MG capsule Take 2 capsules (600 mg total) by mouth 2 (two) times daily. 04/29/21   Steele Sizer, MD  Insulin Pen Needle 31G X 8 MM MISC 1 each by Does not apply route daily. 12/26/20   Steele Sizer, MD  ondansetron (ZOFRAN ODT) 4 MG disintegrating tablet Take 1 tablet (4 mg total) by mouth every 8 (eight) hours as needed for nausea or vomiting. Patient not taking: Reported on 10/27/2021 08/30/21   Harvest Dark, MD  ondansetron (ZOFRAN) 8 MG tablet Take 1 tablet (8 mg total) by mouth 2 (two) times daily as needed. Start on the third day after cisplatin chemotherapy. Patient not taking: Reported on 10/27/2021 05/22/21   Sindy Guadeloupe, MD  prochlorperazine (COMPAZINE) 10 MG tablet Take 1 tablet (10 mg total) by mouth every 6 (six) hours as needed (Nausea or vomiting). Patient not taking: Reported on 10/27/2021 05/22/21   Sindy Guadeloupe, MD  sodium zirconium cyclosilicate  (LOKELMA) 10 g PACK packet Take 10 g by mouth daily. 10/27/21   Sindy Guadeloupe, MD  TRESIBA FLEXTOUCH 100 UNIT/ML FlexTouch Pen INJECT 12-30 UNITS OF INSULIN INTO THE SKIN DAILY TO KEEP FASTING GLUCOSE BETWEEN 100-140 10/16/21   Steele Sizer, MD    Allergies Other and Bydureon [exenatide]  Family History  Problem Relation Age of Onset   Heart attack Mother    CAD Mother    Kidney disease Father     Social History Social History   Tobacco Use   Smoking status: Some Days    Types: Cigarettes   Smokeless tobacco: Never   Tobacco comments:    2 per day 01/30/21  Vaping Use   Vaping Use: Never used  Substance Use Topics   Alcohol use: Yes    Alcohol/week: 0.0 standard drinks    Comment: rare maybe 1-2  year   Drug use: No    Review of Systems  Review of Systems  Constitutional:  Negative for fatigue and fever.  HENT:  Negative for congestion  and sore throat.   Eyes:  Negative for visual disturbance.  Respiratory:  Negative for cough and shortness of breath.   Cardiovascular:  Negative for chest pain.  Gastrointestinal:  Negative for abdominal pain, diarrhea, nausea and vomiting.  Genitourinary:  Negative for flank pain.  Musculoskeletal:  Negative for back pain and neck pain.  Skin:  Negative for rash and wound.  Neurological:  Negative for weakness.    ____________________________________________  PHYSICAL EXAM:      VITAL SIGNS: ED Triage Vitals  Enc Vitals Group     BP 10/29/21 1421 (!) 141/81     Pulse Rate 10/29/21 1421 (!) 109     Resp 10/29/21 1421 17     Temp 10/29/21 1421 98.6 F (37 C)     Temp Source 10/29/21 1421 Oral     SpO2 10/29/21 1421 99 %     Weight 10/29/21 1421 158 lb 4.8 oz (71.8 kg)     Height 10/29/21 1421 $RemoveBefor'5\' 7"'YXOqizDPDUsr$  (1.702 m)     Head Circumference --      Peak Flow --      Pain Score 10/29/21 1421 0     Pain Loc --      Pain Edu? --      Excl. in [place]? --      Physical Exam Vitals and nursing note reviewed.  Constitutional:       General: She is not in acute distress.    Appearance: She is well-developed.  HENT:     Head: Normocephalic and atraumatic.  Eyes:     Conjunctiva/sclera: Conjunctivae normal.  Cardiovascular:     Rate and Rhythm: Normal rate and regular rhythm.     Heart sounds: Normal heart sounds. No murmur heard.   No friction rub.  Pulmonary:     Effort: Pulmonary effort is normal. No respiratory distress.     Breath sounds: Normal breath sounds. No wheezing or rales.  Abdominal:     General: There is no distension.     Palpations: Abdomen is soft.     Tenderness: There is no abdominal tenderness.  Musculoskeletal:     Cervical back: Neck supple.  Skin:    General: Skin is warm.     Capillary Refill: Capillary refill takes less than 2 seconds.  Neurological:     Mental Status: She is alert and oriented to person, place, and time.     Motor: No abnormal muscle tone.      ____________________________________________   LABS (all labs ordered are listed, but only abnormal results are displayed)  Labs Reviewed  BASIC METABOLIC PANEL - Abnormal; Notable for the following components:      Result Value   Sodium 134 (*)    Potassium 6.1 (*)    CO2 20 (*)    Glucose, Bld 109 (*)    Creatinine, Ser 1.22 (*)    Calcium 8.2 (*)    GFR, Estimated 50 (*)    Anion gap 4 (*)    All other components within normal limits  CBC - Abnormal; Notable for the following components:   RBC 3.07 (*)    Hemoglobin 9.1 (*)    HCT 27.1 (*)    RDW 23.9 (*)    All other components within normal limits  MAGNESIUM  TROPONIN I (HIGH SENSITIVITY)  TROPONIN I (HIGH SENSITIVITY)    ____________________________________________  EKG: Sinus tachycardia, VR 104. PR 154, QRS 74, Qc 457. No acute St elevations or depressions. No ischemia or infarct.  ________________________________________  RADIOLOGY All imaging, including plain films, CT scans, and ultrasounds, independently reviewed by me, and interpretations  confirmed via formal radiology reads.  ED MD interpretation:     Official radiology report(s): No results found.  ____________________________________________  PROCEDURES   Procedure(s) performed (including Critical Care):  Procedures  ____________________________________________  INITIAL IMPRESSION / MDM / [place] / ED COURSE  As part of my medical decision making, I reviewed the following data within the [place] notes reviewed and incorporated, Old chart reviewed, Notes from prior ED visits, and [place] Controlled Substance Database       *DANNAH RYLES was evaluated in Emergency Department on 10/29/2021 for the symptoms described in the history of present illness. She was evaluated in the context of the global COVID-19 pandemic, which necessitated consideration that the patient might be at risk for infection with the SARS-CoV-2 virus that causes COVID-19. Institutional protocols and algorithms that pertain to the evaluation of patients at risk for COVID-19 are in a state of rapid change based on information released by regulatory bodies including the CDC and federal and state organizations. These policies and algorithms were followed during the patient's care in the ED.  Some ED evaluations and interventions may be delayed as a result of limited staffing during the pandemic.*     Medical Decision Making:  60 yo F here with asymptomatic hyperkalemia. No overt EKG changes. She is HDS. Suspect this is 2/2 her chemo regimen and losartan use. Will have her hold losartan. Lokelma, insulin, and lasix w/ fluids given here. Will plan to give meds, likely d/c if she remains stable. She has lokelma already prescribed by her oncologist. Will recommend f/u in 48 hours for repeat labs.  ____________________________________________  FINAL CLINICAL IMPRESSION(S) / ED DIAGNOSES  Final diagnoses:  Hyperkalemia     MEDICATIONS GIVEN DURING THIS  VISIT:  Medications  furosemide (LASIX) injection 20 mg (has no administration in time range)  sodium zirconium cyclosilicate (LOKELMA) packet 10 g (has no administration in time range)  sodium chloride 0.9 % bolus 1,000 mL (has no administration in time range)  dextrose 50 % solution 50 mL (has no administration in time range)  insulin aspart (novoLOG) injection 5 Units (has no administration in time range)     ED Discharge Orders     None        Note:  This document was prepared using Dragon voice recognition software and may include unintentional dictation errors.   Duffy Bruce, MD 10/29/21 [phone]

## 2021-10-29 NOTE — ED Triage Notes (Signed)
Pt comes into the ED via POV c/o abnormal lab with a potassium over 6.  Pt denies any chest pain, dizziness, SHOB, nausea, etc.  Pt does admit that her stomach doesn't hurt, but it feels funny.  Pt states labs were drawn today.  Pt ambulatory to triage with even and unlabored respirations.

## 2021-10-29 NOTE — Discharge Instructions (Addendum)
STOP your Losartan  Take the Lokelma prescribed by Dr. Janese Banks  Call Dr. Janese Banks to set up repeat labs, ideally on Friday or Monday  Avoid foods high in potassium  Drink plenty of fluids  Your potassium was elevated at 6.1 today but after treatment in the emergency department has improved to 4.8 which is a normal range.

## 2021-10-29 NOTE — ED Provider Notes (Signed)
Emergency Medicine Provider Triage Evaluation Note  MAYME PROFETA, a 60 y.o. female  was evaluated in triage.  Pt complains of abnormal potassium.  Notified by her PCP after labs were drawn earlier today reported abnormal potassium at greater than 6 mg/DL.  Patient denies any complaints at this time including chest pain, shortness of breath, nausea, dizziness.  She does endorse some mild abdominal discomfort but denies pain.  Review of Systems  Positive: Abnormal potassium Negative: CP, SOB  Physical Exam  BP (!) 141/81 (BP Location: Right Arm)   Pulse (!) 109   Temp 98.6 F (37 C) (Oral)   Resp 17   Ht 5\' 7"  (1.702 m)   Wt 71.8 kg   SpO2 99%   BMI 24.79 kg/m  Gen:   Awake, no distress  NAD Resp:  Normal effort CTA MSK:   Moves extremities without difficulty  Other:  CVS: RRR  Medical Decision Making  Medically screening exam initiated at 3:11 PM.  Appropriate orders placed.  TAMIKI KUBA was informed that the remainder of the evaluation will be completed by another provider, this initial triage assessment does not replace that evaluation, and the importance of remaining in the ED until their evaluation is complete.  Patient ED evaluation of lab reported abnormal potassium greater than 6 mg/DL.   Melvenia Needles, PA-C 10/29/21 1513    Lucrezia Starch, MD 10/29/21 [phone]

## 2021-10-29 NOTE — Telephone Encounter (Signed)
Called pt and she had not got her med to make potassium level go down it is 6.2 still. She said that they changed the med and she was wiating to hear back that iis ready. I told her it was ready yest. She says she will get it today. I told her that I feel that dr Janese Banks would wnt her to go to ER. I called Janese Banks and that is what she wants pt to do. I did tell her to go get the med so she may need it still after getting out of Er. She says she will got ER by 2 pm. Pt still holding her losartan

## 2021-10-30 DIAGNOSIS — E875 Hyperkalemia: Secondary | ICD-10-CM | POA: Diagnosis not present

## 2021-10-30 LAB — BASIC METABOLIC PANEL
Anion gap: 5 (ref 5–15)
BUN: 19 mg/dL (ref 8–23)
CO2: 21 mmol/L — ABNORMAL LOW (ref 22–32)
Calcium: 8.8 mg/dL — ABNORMAL LOW (ref 8.9–10.3)
Chloride: 111 mmol/L (ref 98–111)
Creatinine, Ser: 1.04 mg/dL — ABNORMAL HIGH (ref 0.44–1.00)
GFR, Estimated: >60 mL/min (ref >60)
Glucose, Bld: 66 mg/dL — ABNORMAL LOW (ref 70–99)
Potassium: 4.8 mmol/L (ref 3.5–5.1)
Sodium: 137 mmol/L (ref 135–145)

## 2021-10-30 NOTE — ED Notes (Signed)
Dr. Beather Arbour and Dr. Ellender Hose aware of CBG of 70 prior to insulin admin, ok to give per Dr. Ellender Hose, but give dextrose first.

## 2021-10-30 NOTE — ED Notes (Signed)
Pt given gingerale and graham crackers, family at bedside.

## 2021-10-30 NOTE — ED Provider Notes (Signed)
Patient sent here by her oncologist for hyperkalemia.  Patient received treatment for this from the ED and her potassium was rechecked and is now normal at 4.8.  Will discharge home.  She was already provided a prescription for [facility] by her oncologist.   At this time, I do not feel there is any life-threatening condition present. I have reviewed, interpreted and discussed all results (EKG, imaging, lab, urine as appropriate) and exam findings with patient/family. I have reviewed nursing notes and appropriate previous records.  I feel the patient is safe to be discharged home without further emergent workup and can continue workup as an outpatient as needed. Discussed usual and customary return precautions. Patient/family verbalize understanding and are comfortable with this plan.  Outpatient follow-up has been provided as needed. All questions have been answered.    Jerrad Mendibles, Delice Bison, DO 10/30/21 [phone]

## 2021-11-10 ENCOUNTER — Inpatient Hospital Stay: Payer: Medicare HMO

## 2021-11-10 ENCOUNTER — Inpatient Hospital Stay: Payer: Medicare HMO | Attending: Nurse Practitioner

## 2021-11-10 ENCOUNTER — Other Ambulatory Visit: Payer: Self-pay

## 2021-11-10 VITALS — BP 158/75 | HR 88 | Temp 97.0°F | Resp 18 | Wt 157.4 lb

## 2021-11-10 DIAGNOSIS — F17210 Nicotine dependence, cigarettes, uncomplicated: Secondary | ICD-10-CM | POA: Insufficient documentation

## 2021-11-10 DIAGNOSIS — Z5111 Encounter for antineoplastic chemotherapy: Secondary | ICD-10-CM | POA: Insufficient documentation

## 2021-11-10 DIAGNOSIS — Z90710 Acquired absence of both cervix and uterus: Secondary | ICD-10-CM | POA: Diagnosis not present

## 2021-11-10 DIAGNOSIS — E119 Type 2 diabetes mellitus without complications: Secondary | ICD-10-CM | POA: Diagnosis not present

## 2021-11-10 DIAGNOSIS — R69 Illness, unspecified: Secondary | ICD-10-CM | POA: Diagnosis not present

## 2021-11-10 DIAGNOSIS — C221 Intrahepatic bile duct carcinoma: Secondary | ICD-10-CM | POA: Diagnosis present

## 2021-11-10 DIAGNOSIS — I10 Essential (primary) hypertension: Secondary | ICD-10-CM | POA: Diagnosis not present

## 2021-11-10 LAB — CBC WITH DIFFERENTIAL/PLATELET
Abs Immature Granulocytes: 0.03 10*3/uL (ref 0.00–0.07)
Basophils Absolute: 0.1 10*3/uL (ref 0.0–0.1)
Basophils Relative: 1 %
Eosinophils Absolute: 0.4 10*3/uL (ref 0.0–0.5)
Eosinophils Relative: 5 %
HCT: 29.8 % — ABNORMAL LOW (ref 36.0–46.0)
Hemoglobin: 10.1 g/dL — ABNORMAL LOW (ref 12.0–15.0)
Immature Granulocytes: 0 %
Lymphocytes Relative: 22 %
Lymphs Abs: 2.1 10*3/uL (ref 0.7–4.0)
MCH: 30.1 pg (ref 26.0–34.0)
MCHC: 33.9 g/dL (ref 30.0–36.0)
MCV: 88.7 fL (ref 80.0–100.0)
Monocytes Absolute: 1 10*3/uL (ref 0.1–1.0)
Monocytes Relative: 11 %
Neutro Abs: 5.8 10*3/uL (ref 1.7–7.7)
Neutrophils Relative %: 61 %
Platelets: 192 10*3/uL (ref 150–400)
RBC: 3.36 MIL/uL — ABNORMAL LOW (ref 3.87–5.11)
RDW: 26.3 % — ABNORMAL HIGH (ref 11.5–15.5)
WBC: 9.4 10*3/uL (ref 4.0–10.5)
nRBC: 0 % (ref 0.0–0.2)

## 2021-11-10 LAB — COMPREHENSIVE METABOLIC PANEL
ALT: 31 U/L (ref 0–44)
AST: 45 U/L — ABNORMAL HIGH (ref 15–41)
Albumin: 3 g/dL — ABNORMAL LOW (ref 3.5–5.0)
Alkaline Phosphatase: 406 U/L — ABNORMAL HIGH (ref 38–126)
Anion gap: 8 (ref 5–15)
BUN: 24 mg/dL — ABNORMAL HIGH (ref 8–23)
CO2: 21 mmol/L — ABNORMAL LOW (ref 22–32)
Calcium: 8.6 mg/dL — ABNORMAL LOW (ref 8.9–10.3)
Chloride: 107 mmol/L (ref 98–111)
Creatinine, Ser: 1.2 mg/dL — ABNORMAL HIGH (ref 0.44–1.00)
GFR, Estimated: 51 mL/min — ABNORMAL LOW (ref >60)
Glucose, Bld: 98 mg/dL (ref 70–99)
Potassium: 4.4 mmol/L (ref 3.5–5.1)
Sodium: 136 mmol/L (ref 135–145)
Total Bilirubin: 1.5 mg/dL — ABNORMAL HIGH (ref 0.3–1.2)
Total Protein: 8.3 g/dL — ABNORMAL HIGH (ref 6.5–8.1)

## 2021-11-10 LAB — MAGNESIUM: Magnesium: 2.1 mg/dL (ref 1.7–2.4)

## 2021-11-10 MED ORDER — DIPHENHYDRAMINE HCL 50 MG/ML IJ SOLN
25.0000 mg | INTRAMUSCULAR | Status: AC
  Administered 2021-11-10: 25 mg via INTRAVENOUS

## 2021-11-10 MED ORDER — PALONOSETRON HCL INJECTION 0.25 MG/5ML
0.2500 mg | Freq: Once | INTRAVENOUS | Status: AC
  Administered 2021-11-10: 0.25 mg via INTRAVENOUS
  Filled 2021-11-10: qty 5

## 2021-11-10 MED ORDER — HEPARIN SOD (PORK) LOCK FLUSH 100 UNIT/ML IV SOLN
500.0000 [IU] | Freq: Once | INTRAVENOUS | Status: DC | PRN
  Filled 2021-11-10: qty 5

## 2021-11-10 MED ORDER — SODIUM CHLORIDE 0.9 % IV SOLN
Freq: Once | INTRAVENOUS | Status: AC
Start: 2021-11-10 — End: 2021-11-10
  Filled 2021-11-10: qty 250

## 2021-11-10 MED ORDER — POTASSIUM CHLORIDE IN NACL 20-0.9 MEQ/L-% IV SOLN
Freq: Once | INTRAVENOUS | Status: AC
  Filled 2021-11-10: qty 1000

## 2021-11-10 MED ORDER — SODIUM CHLORIDE 0.9 % IV SOLN
25.0000 mg/m2 | Freq: Once | INTRAVENOUS | Status: AC
  Administered 2021-11-10: 45 mg via INTRAVENOUS
  Filled 2021-11-10: qty 45

## 2021-11-10 MED ORDER — SODIUM CHLORIDE 0.9 % IV SOLN
1400.0000 mg | Freq: Once | INTRAVENOUS | Status: AC
  Administered 2021-11-10: 1400 mg via INTRAVENOUS
  Filled 2021-11-10: qty 26.3

## 2021-11-10 MED ORDER — SODIUM CHLORIDE 0.9 % IV SOLN
10.0000 mg | Freq: Once | INTRAVENOUS | Status: AC
  Administered 2021-11-10: 10 mg via INTRAVENOUS
  Filled 2021-11-10: qty 10

## 2021-11-10 MED ORDER — HEPARIN SOD (PORK) LOCK FLUSH 100 UNIT/ML IV SOLN
INTRAVENOUS | Status: AC
  Administered 2021-11-10: 500 [IU]
  Filled 2021-11-10: qty 5

## 2021-11-10 MED ORDER — MAGNESIUM SULFATE 2 GM/50ML IV SOLN
2.0000 g | Freq: Once | INTRAVENOUS | Status: AC
  Administered 2021-11-10: 2 g via INTRAVENOUS
  Filled 2021-11-10: qty 50

## 2021-11-10 MED ORDER — SODIUM CHLORIDE 0.9 % IV SOLN
150.0000 mg | Freq: Once | INTRAVENOUS | Status: AC
  Administered 2021-11-10: 150 mg via INTRAVENOUS
  Filled 2021-11-10: qty 150

## 2021-11-10 NOTE — Progress Notes (Signed)
1415 pt c/o itching both arms and legs, cisplatin stopped, MD notified, BP 142/64, HR 96, no increase wob noted, benadyrl 25mg  given as ordered, NS infusing at 500cc/hr 1425 Dr Janese Banks at chair side, pt continues with itching , scratching arms and legs,  1450 158/75 HR 88, cont. To c/o itching, no whelps or increase in reaction signs and symptoms

## 2021-11-11 ENCOUNTER — Other Ambulatory Visit: Payer: Self-pay | Admitting: Oncology

## 2021-11-12 ENCOUNTER — Ambulatory Visit: Payer: Self-pay

## 2021-11-12 NOTE — Telephone Encounter (Signed)
Pt called and states she is having excruciating pain 10/10 in her L shoulder and arm. She reports it radiates into her neck on the L side as well. She takes her prescribed meds for pain but it isn't helping. She feels like its arthritis related but not sure. She reports that it has been hurting for a long time now. Advised pt she would need to go to ER or UC d/t no office appts for [place] or [place]. She refused to go UC or ER because she has cancer and isnt going in all of the offices. Called and spoke with Cassandra, [facility] who was connected in call and advised pt of appts upcoming. Appt was scheduled tomorrow at 1300.   Reason for Disposition  [1] SEVERE pain AND [2] not improved 2 hours after pain medicine  Answer Assessment - Initial Assessment Questions 1. ONSET: "When did the pain start?"     For a long while 2. LOCATION: "Where is the pain located?"     L Shoulder down to hand 3. PAIN: "How bad is the pain?" (Scale 1-10; or mild, moderate, severe)   - MILD (1-3): doesn't interfere with normal activities   - MODERATE (4-7): interferes with normal activities (e.g., work or school) or awakens from sleep   - SEVERE (8-10): excruciating pain, unable to do any normal activities, unable to hold a cup of water     10 4. WORK OR EXERCISE: "Has there been any recent work or exercise that involved this part of the body?"     No 5. CAUSE: "What do you think is causing the arm pain?"     arthritis 6. OTHER SYMPTOMS: "Do you have any other symptoms?" (e.g., neck pain, swelling, rash, fever, numbness, weakness)     Neck pain on R side 7. PREGNANCY: "Is there any chance you are pregnant?" "When was your last menstrual period?"     No  Protocols used: Arm Pain-A-AH

## 2021-11-13 ENCOUNTER — Other Ambulatory Visit: Payer: Self-pay | Admitting: Oncology

## 2021-11-13 ENCOUNTER — Encounter: Payer: Self-pay | Admitting: Internal Medicine

## 2021-11-13 ENCOUNTER — Ambulatory Visit (INDEPENDENT_AMBULATORY_CARE_PROVIDER_SITE_OTHER): Payer: Medicare HMO | Admitting: Internal Medicine

## 2021-11-13 VITALS — BP 132/62 | HR 97 | Temp 98.4°F | Resp 16 | Ht 67.0 in | Wt 155.7 lb

## 2021-11-13 DIAGNOSIS — M1990 Unspecified osteoarthritis, unspecified site: Secondary | ICD-10-CM

## 2021-11-13 DIAGNOSIS — M25512 Pain in left shoulder: Secondary | ICD-10-CM | POA: Diagnosis not present

## 2021-11-13 DIAGNOSIS — G8929 Other chronic pain: Secondary | ICD-10-CM

## 2021-11-13 MED ORDER — METHYLPREDNISOLONE 4 MG PO TBPK: ORAL_TABLET | ORAL | 0 refills | Status: DC

## 2021-11-13 NOTE — Progress Notes (Signed)
Acute Office Visit  Subjective:    Patient ID: Janice Short, female    DOB: Feb 28, 1961, 60 y.o.   MRN: [id number]  Chief Complaint  Patient presents with   Shoulder Pain    left    HPI Patient is in today for left shoulder pain. Has arthritis for several years, flare for last 3 weeks.   SHOULDER PAIN Duration: chronic Involved shoulder: left Mechanism of injury: arthritis, no recent injury Location: diffuse Onset:gradual Severity: 9/10  Quality:  sharp Frequency: intermittent Radiation: yes from neck into hands Aggravating factors:  None   Alleviating factors:  Voltaren and APAP - does get steroid injections but doesn't remember last one Status: stable Treatments attempted: Uses Tylenol, Voltaren, can't take NSAIDs  Weakness: yes Numbness: no Decreased grip strength: no Redness: no Swelling: no Bruising: no Fevers: no   Past Medical History:  Diagnosis Date   Allergy    Asthma    Carpal tunnel syndrome on left    Cervical radiculitis    Chronic osteoarthritis    Corns and callosity    Depression    Dermatophytosis of foot    Diabetes mellitus without complication (HCC)    Gout    Hyperlipidemia    Hypertension    Impingement syndrome of left shoulder    Intrahepatic cholangiocarcinoma (HCC)    Lumbosacral neuritis    Microalbuminuria    Obesity    Supraventricular tachycardia ([place]) 07/01/2015   SVT (supraventricular tachycardia) (HCC)    Tenosynovitis of wrist    Vitamin D deficiency     Past Surgical History:  Procedure Laterality Date   ABDOMINAL HYSTERECTOMY     COLONOSCOPY WITH PROPOFOL N/A 05/06/2021   Procedure: COLONOSCOPY WITH PROPOFOL;  Surgeon: Lucilla Lame, MD;  Location: [facility] ENDOSCOPY;  Service: Endoscopy;  Laterality: N/A;   ESOPHAGOGASTRODUODENOSCOPY (EGD) WITH PROPOFOL N/A 05/06/2021   Procedure: ESOPHAGOGASTRODUODENOSCOPY (EGD) WITH PROPOFOL;  Surgeon: Lucilla Lame, MD;  Location: [facility] ENDOSCOPY;  Service: Endoscopy;  Laterality:  N/A;   PORTA CATH INSERTION N/A 07/17/2021   Procedure: PORTA CATH INSERTION;  Surgeon: Algernon Huxley, MD;  Location: [facility];  Service: Cardiovascular;  Laterality: N/A;    Family History  Problem Relation Age of Onset   Heart attack Mother    CAD Mother    Kidney disease Father     Social History   Socioeconomic History   Marital status: Single    Spouse name: Not on file   Number of children: 2   Years of education: Not on file   Highest education level: Not on file  Occupational History   Occupation: disable     Comment: from chronic back pain, 2019  Tobacco Use   Smoking status: Some Days    Types: Cigarettes   Smokeless tobacco: Never   Tobacco comments:    2 per day 01/30/21  Vaping Use   Vaping Use: Never used  Substance and Sexual Activity   Alcohol use: Yes    Alcohol/week: 0.0 standard drinks    Comment: rare maybe 1-2  year   Drug use: No   Sexual activity: Yes  Other Topics Concern   Not on file  Social History Narrative   She is on disability for chronic back pain. Pt's son lives with her   Social Determinants of Health   Financial Resource Strain: Medium Risk   Difficulty of Paying Living Expenses: Somewhat hard  Food Insecurity: No Food Insecurity   Worried About Running Out of  Food in the Last Year: Never true   [place] in the Last Year: Never true  Transportation Needs: No Transportation Needs   Lack of Transportation (Medical): No   Lack of Transportation (Non-Medical): No  Physical Activity: Inactive   Days of Exercise per Week: 0 days   Minutes of Exercise per Session: 0 min  Stress: Stress Concern Present   Feeling of Stress : To some extent  Social Connections: Moderately Isolated   Frequency of Communication with Friends and Family: More than three times a week   Frequency of Social Gatherings with Friends and Family: Three times a week   Attends Religious Services: More than 4 times per year   Active Member of  Clubs or Organizations: No   Attends Archivist Meetings: Never   Marital Status: Never married  Human resources officer Violence: Not At Risk   Fear of Current or Ex-Partner: No   Emotionally Abused: No   Physically Abused: No   Sexually Abused: No    Outpatient Medications Prior to Visit  Medication Sig Dispense Refill   albuterol (VENTOLIN HFA) 108 (90 Base) MCG/ACT inhaler INHALE 2 PUFFS BY MOUTH EVERY 6 HOURS AS NEEDED FOR WHEEZING AND FOR SHORTNESS OF BREATH 18 g 1   aspirin 81 MG tablet Take 1 tablet by mouth daily.     atorvastatin (LIPITOR) 40 MG tablet Take 1 tablet (40 mg total) by mouth at bedtime. 90 tablet 1   blood glucose meter kit and supplies Dispense based on patient and insurance preference. Use up to four times daily as directed. (FOR ICD-10 E10.9, E11.9). 1 each 0   dexamethasone (DECADRON) 4 MG tablet Take 2 tablets (8 mg total) by mouth daily. Take daily x 3 days starting the day after cisplatin chemotherapy. Take with food. (Patient not taking: Reported on 09/19/2021) 30 tablet 1   diphenhydramine-acetaminophen (TYLENOL PM) 25-500 MG TABS tablet Take 1 tablet by mouth at bedtime as needed.     escitalopram (LEXAPRO) 20 MG tablet Take 1 tablet (20 mg total) by mouth daily. 90 tablet 1   fluticasone (FLONASE) 50 MCG/ACT nasal spray USE 2 SPRAY(S) IN EACH NOSTRIL AT BEDTIME (Patient taking differently: Place 2 sprays into both nostrils daily as needed. USE 2 SPRAY(S) IN EACH NOSTRIL AT BEDTIME) 16 g 0   Fluticasone-Umeclidin-Vilant (TRELEGY ELLIPTA) 100-62.5-25 MCG/INH AEPB Take 1 puff by mouth daily. 180 each 1   gabapentin (NEURONTIN) 300 MG capsule Take 2 capsules (600 mg total) by mouth 2 (two) times daily. 360 capsule 1   Insulin Pen Needle 31G X 8 MM MISC 1 each by Does not apply route daily. 100 each 1   ondansetron (ZOFRAN ODT) 4 MG disintegrating tablet Take 1 tablet (4 mg total) by mouth every 8 (eight) hours as needed for nausea or vomiting. (Patient not  taking: Reported on 10/27/2021) 20 tablet 0   ondansetron (ZOFRAN) 8 MG tablet Take 1 tablet (8 mg total) by mouth 2 (two) times daily as needed. Start on the third day after cisplatin chemotherapy. (Patient not taking: Reported on 10/27/2021) 30 tablet 1   prochlorperazine (COMPAZINE) 10 MG tablet Take 1 tablet (10 mg total) by mouth every 6 (six) hours as needed (Nausea or vomiting). (Patient not taking: Reported on 10/27/2021) 30 tablet 1   sodium zirconium cyclosilicate (LOKELMA) 10 g PACK packet Take 10 g by mouth daily. 10 each 1   TRESIBA FLEXTOUCH 100 UNIT/ML FlexTouch Pen INJECT 12-30 UNITS OF INSULIN INTO THE  SKIN DAILY TO KEEP FASTING GLUCOSE BETWEEN 100-140 15 mL 0   No facility-administered medications prior to visit.    Allergies  Allergen Reactions   Other Shortness Of Breath    Cat dander and grass pollen   Cisplatin Itching   Bydureon [Exenatide] Other (See Comments)    Pain with injection not allergy    Review of Systems  Constitutional:  Negative for chills and fever.  Musculoskeletal:  Positive for arthralgias.  Skin: Negative.   Neurological:  Positive for weakness. Negative for numbness.      Objective:    Physical Exam Constitutional:      Appearance: Normal appearance.  HENT:     Head: Normocephalic and atraumatic.  Eyes:     Conjunctiva/sclera: Conjunctivae normal.  Cardiovascular:     Rate and Rhythm: Normal rate and regular rhythm.  Pulmonary:     Effort: Pulmonary effort is normal.     Breath sounds: Normal breath sounds.  Musculoskeletal:        General: Tenderness present.     Right lower leg: No edema.     Left lower leg: No edema.     Comments: Pain of left shoulder with abduction, flexion, external/internal rotation. Strength 3/5.   Skin:    General: Skin is warm and dry.  Neurological:     General: No focal deficit present.     Mental Status: She is alert. Mental status is at baseline.  Psychiatric:        Mood and Affect: Mood  normal.        Behavior: Behavior normal.       BP 132/62   Pulse 97   Temp 98.4 F (36.9 C)   Resp 16   Ht $R'5\' 7"'vE$  (1.702 m)   Wt 155 lb 11.2 oz (70.6 kg)   SpO2 99%   BMI 24.39 kg/m  Wt Readings from Last 3 Encounters:  11/10/21 157 lb 6.4 oz (71.4 kg)  10/29/21 158 lb 4.8 oz (71.8 kg)  10/27/21 158 lb 4.8 oz (71.8 kg)    Health Maintenance Due  Topic Date Due   COVID-19 Vaccine (1) Never done   OPHTHALMOLOGY EXAM  Never done   Zoster Vaccines- Shingrix (1 of 2) Never done   MAMMOGRAM  04/23/2016   URINE MICROALBUMIN  12/26/2021    There are no preventive care reminders to display for this patient.   Lab Results  Component Value Date   TSH 2.558 04/21/2021   Lab Results  Component Value Date   WBC 9.4 11/10/2021   HGB 10.1 (L) 11/10/2021   HCT 29.8 (L) 11/10/2021   MCV 88.7 11/10/2021   PLT 192 11/10/2021   Lab Results  Component Value Date   NA 136 11/10/2021   K 4.4 11/10/2021   CO2 21 (L) 11/10/2021   GLUCOSE 98 11/10/2021   BUN 24 (H) 11/10/2021   CREATININE 1.20 (H) 11/10/2021   BILITOT 1.5 (H) 11/10/2021   ALKPHOS 406 (H) 11/10/2021   AST 45 (H) 11/10/2021   ALT 31 11/10/2021   PROT 8.3 (H) 11/10/2021   ALBUMIN 3.0 (L) 11/10/2021   CALCIUM 8.6 (L) 11/10/2021   ANIONGAP 8 11/10/2021   Lab Results  Component Value Date   CHOL 197 12/26/2020   Lab Results  Component Value Date   HDL 36 (L) 12/26/2020   Lab Results  Component Value Date   LDLCALC 129 (H) 12/26/2020   Lab Results  Component Value Date   TRIG 181 (H)  12/26/2020   Lab Results  Component Value Date   CHOLHDL 5.5 (H) 12/26/2020   Lab Results  Component Value Date   HGBA1C 5.1 09/19/2021       Assessment & Plan:   1. Chronic left shoulder pain/Arthritis: Unfortunately we did not have Lidocaine in the office presently and therefore the patient opted out of injection. A medrol dosepack will be sent to her pharmacy to reduce inflammation. We did discuss how this  could affect her blood sugars. She declined an orthopedic referral today. Continue Tylenol/Voltaren as needed.   - methylPREDNISolone (MEDROL DOSEPAK) 4 MG TBPK tablet; Day 1: Take 8 mg (2 tablets) before breakfast, 4 mg (1 tablet) after lunch, 4 mg (1 tablet) after supper, and 8 mg (2 tablets) at bedtime. Day 2:Take 4 mg (1 tablet) before breakfast, 4 mg (1 tablet) after lunch, 4 mg (1 tablet) after supper, and 8 mg (2 tablets) at bedtime. Day 3: Take 4 mg (1 tablet) before breakfast, 4 mg (1 tablet) after lunch, 4 mg (1 tablet) after supper, and 4 mg (1 tablet) at bedtime. Day 4: Take 4 mg (1 tablet) before breakfast, 4 mg (1 tablet) after lunch, and 4 mg (1 tablet) at bedtime. Day 5: Take 4 mg (1 tablet) before breakfast and 4 mg (1 tablet) at bedtime. Day 6: Take 4 mg (1 tablet) before breakfast.  Dispense: 1 each; Refill: 0   Teodora Medici, DO

## 2021-11-13 NOTE — Patient Instructions (Signed)
It was great seeing you today!  Plan discussed at today's visit: -Medrol dose pack sent to pharmacy -Continue using Voltaren gel -Let me know if you decide you want to see Orthopedics   Follow up in: as needed.   Take care and let us know if you have any questions or concerns prior to your next visit.  Dr. Rosana Berger

## 2021-11-17 ENCOUNTER — Emergency Department: Payer: Medicare HMO

## 2021-11-17 ENCOUNTER — Inpatient Hospital Stay: Payer: Medicare HMO

## 2021-11-17 ENCOUNTER — Encounter: Payer: Self-pay | Admitting: Nurse Practitioner

## 2021-11-17 ENCOUNTER — Other Ambulatory Visit: Payer: Self-pay

## 2021-11-17 ENCOUNTER — Emergency Department
Admission: EM | Admit: 2021-11-17 | Discharge: 2021-11-17 | Disposition: A | Discharge status: Home / Self Care | Payer: Medicare HMO | Attending: Emergency Medicine | Admitting: Emergency Medicine

## 2021-11-17 DIAGNOSIS — R1011 Right upper quadrant pain: Secondary | ICD-10-CM | POA: Diagnosis not present

## 2021-11-17 DIAGNOSIS — K7689 Other specified diseases of liver: Secondary | ICD-10-CM | POA: Diagnosis not present

## 2021-11-17 DIAGNOSIS — I10 Essential (primary) hypertension: Secondary | ICD-10-CM | POA: Insufficient documentation

## 2021-11-17 DIAGNOSIS — Z794 Long term (current) use of insulin: Secondary | ICD-10-CM | POA: Diagnosis not present

## 2021-11-17 DIAGNOSIS — R69 Illness, unspecified: Secondary | ICD-10-CM | POA: Diagnosis not present

## 2021-11-17 DIAGNOSIS — K8020 Calculus of gallbladder without cholecystitis without obstruction: Secondary | ICD-10-CM | POA: Diagnosis not present

## 2021-11-17 DIAGNOSIS — R945 Abnormal results of liver function studies: Secondary | ICD-10-CM | POA: Diagnosis not present

## 2021-11-17 DIAGNOSIS — F17210 Nicotine dependence, cigarettes, uncomplicated: Secondary | ICD-10-CM | POA: Diagnosis not present

## 2021-11-17 DIAGNOSIS — Z90710 Acquired absence of both cervix and uterus: Secondary | ICD-10-CM | POA: Diagnosis not present

## 2021-11-17 DIAGNOSIS — J45909 Unspecified asthma, uncomplicated: Secondary | ICD-10-CM | POA: Diagnosis not present

## 2021-11-17 DIAGNOSIS — C221 Intrahepatic bile duct carcinoma: Secondary | ICD-10-CM | POA: Diagnosis not present

## 2021-11-17 DIAGNOSIS — I2510 Atherosclerotic heart disease of native coronary artery without angina pectoris: Secondary | ICD-10-CM | POA: Diagnosis not present

## 2021-11-17 DIAGNOSIS — Z7982 Long term (current) use of aspirin: Secondary | ICD-10-CM | POA: Insufficient documentation

## 2021-11-17 DIAGNOSIS — Z5111 Encounter for antineoplastic chemotherapy: Secondary | ICD-10-CM | POA: Diagnosis not present

## 2021-11-17 DIAGNOSIS — Z7951 Long term (current) use of inhaled steroids: Secondary | ICD-10-CM | POA: Insufficient documentation

## 2021-11-17 DIAGNOSIS — E1142 Type 2 diabetes mellitus with diabetic polyneuropathy: Secondary | ICD-10-CM | POA: Diagnosis not present

## 2021-11-17 DIAGNOSIS — K838 Other specified diseases of biliary tract: Secondary | ICD-10-CM | POA: Diagnosis not present

## 2021-11-17 DIAGNOSIS — E119 Type 2 diabetes mellitus without complications: Secondary | ICD-10-CM | POA: Diagnosis not present

## 2021-11-17 DIAGNOSIS — R748 Abnormal levels of other serum enzymes: Secondary | ICD-10-CM | POA: Diagnosis not present

## 2021-11-17 DIAGNOSIS — R17 Unspecified jaundice: Secondary | ICD-10-CM

## 2021-11-17 DIAGNOSIS — I700 Atherosclerosis of aorta: Secondary | ICD-10-CM | POA: Diagnosis not present

## 2021-11-17 LAB — COMPREHENSIVE METABOLIC PANEL
ALT: 49 U/L — ABNORMAL HIGH (ref 0–44)
AST: 52 U/L — ABNORMAL HIGH (ref 15–41)
Albumin: 2.7 g/dL — ABNORMAL LOW (ref 3.5–5.0)
Alkaline Phosphatase: 397 U/L — ABNORMAL HIGH (ref 38–126)
Anion gap: 7 (ref 5–15)
BUN: 23 mg/dL (ref 8–23)
CO2: 21 mmol/L — ABNORMAL LOW (ref 22–32)
Calcium: 8.3 mg/dL — ABNORMAL LOW (ref 8.9–10.3)
Chloride: 105 mmol/L (ref 98–111)
Creatinine, Ser: 1.22 mg/dL — ABNORMAL HIGH (ref 0.44–1.00)
GFR, Estimated: 50 mL/min — ABNORMAL LOW (ref >60)
Glucose, Bld: 150 mg/dL — ABNORMAL HIGH (ref 70–99)
Potassium: 4.4 mmol/L (ref 3.5–5.1)
Sodium: 133 mmol/L — ABNORMAL LOW (ref 135–145)
Total Bilirubin: 3.8 mg/dL — ABNORMAL HIGH (ref 0.3–1.2)
Total Protein: 7.9 g/dL (ref 6.5–8.1)

## 2021-11-17 LAB — CBC WITH DIFFERENTIAL/PLATELET
Abs Immature Granulocytes: 0.04 10*3/uL (ref 0.00–0.07)
Basophils Absolute: 0 10*3/uL (ref 0.0–0.1)
Basophils Relative: 1 %
Eosinophils Absolute: 0.1 10*3/uL (ref 0.0–0.5)
Eosinophils Relative: 1 %
HCT: 24.6 % — ABNORMAL LOW (ref 36.0–46.0)
Hemoglobin: 8.6 g/dL — ABNORMAL LOW (ref 12.0–15.0)
Immature Granulocytes: 1 %
Lymphocytes Relative: 31 %
Lymphs Abs: 1.6 10*3/uL (ref 0.7–4.0)
MCH: 29.9 pg (ref 26.0–34.0)
MCHC: 35 g/dL (ref 30.0–36.0)
MCV: 85.4 fL (ref 80.0–100.0)
Monocytes Absolute: 0.6 10*3/uL (ref 0.1–1.0)
Monocytes Relative: 11 %
Neutro Abs: 2.8 10*3/uL (ref 1.7–7.7)
Neutrophils Relative %: 55 %
Platelets: 112 10*3/uL — ABNORMAL LOW (ref 150–400)
RBC: 2.88 MIL/uL — ABNORMAL LOW (ref 3.87–5.11)
RDW: 25 % — ABNORMAL HIGH (ref 11.5–15.5)
Smear Review: NORMAL
WBC: 5.1 10*3/uL (ref 4.0–10.5)
nRBC: 0 % (ref 0.0–0.2)

## 2021-11-17 LAB — MAGNESIUM: Magnesium: 1.9 mg/dL (ref 1.7–2.4)

## 2021-11-17 LAB — PROTIME-INR
INR: 1 (ref 0.8–1.2)
Prothrombin Time: 13.6 seconds (ref 11.4–15.2)

## 2021-11-17 LAB — LIPASE, BLOOD: Lipase: 30 U/L (ref 11–51)

## 2021-11-17 MED ORDER — SODIUM CHLORIDE 0.9 % IV BOLUS
1000.0000 mL | Freq: Once | INTRAVENOUS | Status: AC
  Administered 2021-11-17: 1000 mL via INTRAVENOUS

## 2021-11-17 MED ORDER — HEPARIN SOD (PORK) LOCK FLUSH 100 UNIT/ML IV SOLN
500.0000 [IU] | Freq: Once | INTRAVENOUS | Status: AC
  Administered 2021-11-17: 500 [IU] via INTRAVENOUS
  Filled 2021-11-17: qty 5

## 2021-11-17 MED ORDER — IOHEXOL 300 MG/ML  SOLN
100.0000 mL | Freq: Once | INTRAMUSCULAR | Status: AC | PRN
  Administered 2021-11-17: 100 mL via INTRAVENOUS

## 2021-11-17 NOTE — Progress Notes (Signed)
Discussed patient with ER. No obstruction or progressive disease on CT. Reviewed with Dr. Grayland Ormond. Will plan to recheck LFTs later this week and then see her next week for possible chemo. Schedule message sent to get patient scheduled for both.

## 2021-11-17 NOTE — Progress Notes (Unsigned)
Janice Rutter, NP = that's a jump. I'd like to hold treatment today and see if we can move up her ct scan that's scheduled for 12/20

## 2021-11-17 NOTE — ED Provider Notes (Signed)
CT scan without any significant change from previous. Discussed with Beckey Rutter, NP with oncology. Will plan on discharging. Oncology will plan on following up.   Nance Pear, MD 11/17/21 [phone]

## 2021-11-17 NOTE — Discharge Instructions (Addendum)
Please seek medical attention for any high fevers, chest pain, shortness of breath, change in behavior, persistent vomiting, bloody stool or any other new or concerning symptoms.  

## 2021-11-17 NOTE — ED Provider Notes (Signed)
[facility] Emergency Department Provider Note  ____________________________________________   Event Date/Time   First MD Initiated Contact with Patient 11/17/21 1348     (approximate)  I have reviewed the triage vital signs and the nursing notes.   HISTORY  Chief Complaint Abnormal Lab    HPI Janice Short is a 60 y.o. female with history of cholangiocarcinoma here with elevated bilirubin.  The patient presents from oncology clinic.  She was sent because she cannot have treatment today due to elevated bilirubin.  Patient herself states that she has been doing very well.  She had 1 episode of transient right upper quadrant abdominal pain last week, but this has not been persistent.  She has had normal appetite.  She has been able to eat and drink.  She denies any fevers.  No chills.  No other complaints.    Past Medical History:  Diagnosis Date   Allergy    Asthma    Carpal tunnel syndrome on left    Cervical radiculitis    Chronic osteoarthritis    Corns and callosity    Depression    Dermatophytosis of foot    Diabetes mellitus without complication (HCC)    Gout    Hyperlipidemia    Hypertension    Impingement syndrome of left shoulder    Intrahepatic cholangiocarcinoma (HCC)    Lumbosacral neuritis    Microalbuminuria    Obesity    Supraventricular tachycardia ([place]) 07/01/2015   SVT (supraventricular tachycardia) ([place])    Tenosynovitis of wrist    Vitamin D deficiency     Patient Active Problem List   Diagnosis Date Noted   Rectal polyp    Polyp of sigmoid colon    Abnormal findings on diagnostic imaging of digestive system    Gastritis with hemorrhage    Polyp of duodenum    Intrahepatic cholangiocarcinoma ([place]) 04/12/2021   Goals of care, counseling/discussion 04/12/2021   Mild episode of recurrent major depressive disorder ([place]) 02/08/2019   Dyslipidemia associated with type 2 diabetes mellitus ([place]) 12/17/2017   Noncompliance  with medication regimen 12/17/2017   Spinal stenosis 10/15/2016   Diabetic polyneuropathy associated with type 2 diabetes mellitus ([place]) 07/14/2016   Chronic midline low back pain with sciatica 06/22/2016   Primary osteoarthritis of both knees 06/22/2016   Carpal tunnel syndrome 07/01/2015   Cervical radiculitis 07/01/2015   Corn or callus 07/01/2015   Impingement syndrome of shoulder 07/01/2015   Microalbuminuria 07/01/2015   Compulsive tobacco user syndrome 07/01/2015   Tenosynovitis of wrist 07/01/2015   Benign hypertension 11/05/2009   Athlete's foot 08/05/2009   Vitamin D deficiency 01/08/2009   Asthma, mild intermittent 12/28/2008   Allergic rhinitis 12/28/2008   Lumbosacral neuritis 08/11/2007    Past Surgical History:  Procedure Laterality Date   ABDOMINAL HYSTERECTOMY     COLONOSCOPY WITH PROPOFOL N/A 05/06/2021   Procedure: COLONOSCOPY WITH PROPOFOL;  Surgeon: Lucilla Lame, MD;  Location: [facility] ENDOSCOPY;  Service: Endoscopy;  Laterality: N/A;   ESOPHAGOGASTRODUODENOSCOPY (EGD) WITH PROPOFOL N/A 05/06/2021   Procedure: ESOPHAGOGASTRODUODENOSCOPY (EGD) WITH PROPOFOL;  Surgeon: Lucilla Lame, MD;  Location: [facility] ENDOSCOPY;  Service: Endoscopy;  Laterality: N/A;   PORTA CATH INSERTION N/A 07/17/2021   Procedure: PORTA CATH INSERTION;  Surgeon: Algernon Huxley, MD;  Location: [place] CV LAB;  Service: Cardiovascular;  Laterality: N/A;    Prior to Admission medications   Medication Sig Start Date End Date Taking? Authorizing Provider  albuterol (VENTOLIN HFA) 108 (90 Base)  MCG/ACT inhaler INHALE 2 PUFFS BY MOUTH EVERY 6 HOURS AS NEEDED FOR WHEEZING AND FOR SHORTNESS OF BREATH 02/15/20   Steele Sizer, MD  aspirin 81 MG tablet Take 1 tablet by mouth daily. 11/05/09   [provider]  atorvastatin (LIPITOR) 40 MG tablet Take 1 tablet (40 mg total) by mouth at bedtime. 10/10/21   Borders, Kirt Boys, NP  blood glucose meter kit and supplies Dispense based on patient and  insurance preference. Use up to four times daily as directed. (FOR ICD-10 E10.9, E11.9). 04/29/21   Steele Sizer, MD  diphenhydramine-acetaminophen (TYLENOL PM) 25-500 MG TABS tablet Take 1 tablet by mouth at bedtime as needed.    [provider]  escitalopram (LEXAPRO) 20 MG tablet Take 1 tablet (20 mg total) by mouth daily. 09/19/21   Steele Sizer, MD  fluticasone (FLONASE) 50 MCG/ACT nasal spray USE 2 SPRAY(S) IN EACH NOSTRIL AT BEDTIME Patient taking differently: Place 2 sprays into both nostrils daily as needed. USE 2 SPRAY(S) IN EACH NOSTRIL AT BEDTIME 02/19/21   Steele Sizer, MD  Fluticasone-Umeclidin-Vilant (TRELEGY ELLIPTA) 100-62.5-25 MCG/INH AEPB Take 1 puff by mouth daily. 09/19/21   Steele Sizer, MD  gabapentin (NEURONTIN) 300 MG capsule Take 2 capsules (600 mg total) by mouth 2 (two) times daily. 04/29/21   Steele Sizer, MD  Insulin Pen Needle 31G X 8 MM MISC 1 each by Does not apply route daily. 12/26/20   Steele Sizer, MD  methylPREDNISolone (MEDROL DOSEPAK) 4 MG TBPK tablet Day 1: Take 8 mg (2 tablets) before breakfast, 4 mg (1 tablet) after lunch, 4 mg (1 tablet) after supper, and 8 mg (2 tablets) at bedtime. Day 2:Take 4 mg (1 tablet) before breakfast, 4 mg (1 tablet) after lunch, 4 mg (1 tablet) after supper, and 8 mg (2 tablets) at bedtime. Day 3: Take 4 mg (1 tablet) before breakfast, 4 mg (1 tablet) after lunch, 4 mg (1 tablet) after supper, and 4 mg (1 tablet) at bedtime. Day 4: Take 4 mg (1 tablet) before breakfast, 4 mg (1 tablet) after lunch, and 4 mg (1 tablet) at bedtime. Day 5: Take 4 mg (1 tablet) before breakfast and 4 mg (1 tablet) at bedtime. Day 6: Take 4 mg (1 tablet) before breakfast. 11/13/21   Teodora Medici, DO  ondansetron (ZOFRAN ODT) 4 MG disintegrating tablet Take 1 tablet (4 mg total) by mouth every 8 (eight) hours as needed for nausea or vomiting. Patient not taking: Reported on 10/27/2021 08/30/21   Harvest Dark, MD  ondansetron  (ZOFRAN) 8 MG tablet Take 1 tablet (8 mg total) by mouth 2 (two) times daily as needed. Start on the third day after cisplatin chemotherapy. Patient not taking: Reported on 10/27/2021 05/22/21   Sindy Guadeloupe, MD  prochlorperazine (COMPAZINE) 10 MG tablet Take 1 tablet (10 mg total) by mouth every 6 (six) hours as needed (Nausea or vomiting). Patient not taking: Reported on 10/27/2021 05/22/21   Sindy Guadeloupe, MD  sodium zirconium cyclosilicate (LOKELMA) 10 g PACK packet Take 10 g by mouth daily. 10/27/21   Sindy Guadeloupe, MD  TRESIBA FLEXTOUCH 100 UNIT/ML FlexTouch Pen INJECT 12-30 UNITS OF INSULIN INTO THE SKIN DAILY TO KEEP FASTING GLUCOSE BETWEEN 100-140 10/16/21   Steele Sizer, MD    Allergies Other, Cisplatin, and Bydureon [exenatide]  Family History  Problem Relation Age of Onset   Heart attack Mother    CAD Mother    Kidney disease Father     Social History Social  History   Tobacco Use   Smoking status: Some Days    Types: Cigarettes   Smokeless tobacco: Never   Tobacco comments:    2 per day 01/30/21  Vaping Use   Vaping Use: Never used  Substance Use Topics   Alcohol use: Yes    Alcohol/week: 0.0 standard drinks    Comment: rare maybe 1-2  year   Drug use: No    Review of Systems  Review of Systems  Constitutional:  Negative for chills and fever.  HENT:  Negative for sore throat.   Respiratory:  Negative for shortness of breath.   Cardiovascular:  Negative for chest pain.  Gastrointestinal:  Negative for abdominal pain.  Genitourinary:  Negative for flank pain.  Musculoskeletal:  Negative for neck pain.  Skin:  Negative for rash and wound.  Allergic/Immunologic: Negative for immunocompromised state.  Neurological:  Negative for weakness and numbness.  Hematological:  Does not bruise/bleed easily.    ____________________________________________  PHYSICAL EXAM:      VITAL SIGNS: ED Triage Vitals  Enc Vitals Group     BP 11/17/21 1102 (!) 146/104      Pulse Rate 11/17/21 1102 82     Resp 11/17/21 1102 17     Temp 11/17/21 1102 98.2 F (36.8 C)     Temp Source 11/17/21 1102 Oral     SpO2 11/17/21 1102 100 %     Weight 11/17/21 1103 155 lb (70.3 kg)     Height 11/17/21 1103 5' 7"  (1.702 m)     Head Circumference --      Peak Flow --      Pain Score 11/17/21 1103 0     Pain Loc --      Pain Edu? --      Excl. in [place]? --      Physical Exam Vitals and nursing note reviewed.  Constitutional:      General: She is not in acute distress.    Appearance: She is well-developed.  HENT:     Head: Normocephalic and atraumatic.  Eyes:     Conjunctiva/sclera: Conjunctivae normal.  Cardiovascular:     Rate and Rhythm: Normal rate and regular rhythm.     Heart sounds: Normal heart sounds.  Pulmonary:     Effort: Pulmonary effort is normal. No respiratory distress.     Breath sounds: No wheezing.  Abdominal:     General: Abdomen is flat. There is no distension.     Comments: No right upper quadrant other tenderness.  No rebound or guarding.  No CVA tenderness.  Musculoskeletal:     Cervical back: Neck supple.  Skin:    General: Skin is warm.     Capillary Refill: Capillary refill takes less than 2 seconds.     Findings: No rash.  Neurological:     Mental Status: She is alert and oriented to person, place, and time.     Motor: No abnormal muscle tone.      ____________________________________________   LABS (all labs ordered are listed, but only abnormal results are displayed)  Labs Reviewed  PROTIME-INR  LIPASE, BLOOD    ____________________________________________  EKG:  ________________________________________  RADIOLOGY All imaging, including plain films, CT scans, and ultrasounds, independently reviewed by me, and interpretations confirmed via formal radiology reads.  ED MD interpretation:   CT chest/abdomen/pelvis: Pending  Official radiology report(s): CT CHEST ABDOMEN PELVIS W CONTRAST  Result Date:  11/17/2021 CLINICAL DATA:  Abnormal liver enzymes. Intrahepatic cholangiocarcinoma. Restaging. EXAM:  CT CHEST, ABDOMEN, AND PELVIS WITH CONTRAST TECHNIQUE: Multidetector CT imaging of the chest, abdomen and pelvis was performed following the standard protocol during bolus administration of intravenous contrast. CONTRAST:  176m OMNIPAQUE IOHEXOL 300 MG/ML  SOLN COMPARISON:  08/12/2021 FINDINGS: CT CHEST FINDINGS Cardiovascular: The heart size is normal. No substantial pericardial effusion. Coronary artery calcification is evident. Mild atherosclerotic calcification is noted in the wall of the thoracic aorta. Right Port-A-Cath tip is positioned below the right atrium in the IVC. Mediastinum/Nodes: No mediastinal lymphadenopathy. No evidence for gross hilar lymphadenopathy although assessment is limited by the lack of intravenous contrast on the current study. The esophagus has normal imaging features. There is no axillary lymphadenopathy. Lungs/Pleura: No suspicious pulmonary nodule or mass. No focal airspace consolidation. There is no evidence of pleural effusion. Musculoskeletal: No worrisome lytic or sclerotic osseous abnormality. CT ABDOMEN PELVIS FINDINGS Hepatobiliary: Similar appearance of volume loss in the right hepatic lobe. Right hepatic lobe lesion measured previously at 4.6 x 4.9 cm is 4.0 x 5.3 cm today when measured in a similar fashion. Similar appearance of satellite lesions although differential enhancement characteristics may reflect bolus timing. Stable intrahepatic biliary duct dilatation adjacent to the mass. Layering tiny calcified gallstones evident. Common bile duct in the head of the pancreas is more distended today measuring up to 6 mm diameter compared to 4 mm previously. Nodular hepatic contour is stable. Pancreas: No focal mass lesion. No dilatation of the main duct. No intraparenchymal cyst. No peripancreatic edema. Spleen: No splenomegaly. No focal mass lesion. Adrenals/Urinary Tract:  Right adrenal gland unremarkable. Thickening of the left adrenal gland is stable. Right kidney unremarkable. Tiny cyst noted upper pole left kidney. No evidence for hydroureter. The urinary bladder appears normal for the degree of distention. Stomach/Bowel: Stomach is unremarkable. No gastric wall thickening. No evidence of outlet obstruction. Duodenum is normally positioned as is the ligament of Treitz. Duodenal diverticulum noted. No small bowel wall thickening. No small bowel dilatation. The terminal ileum is normal. The appendix is not well visualized, but there is no edema or inflammation in the region of the cecum. No gross colonic mass. No colonic wall thickening. Vascular/Lymphatic: There is mild atherosclerotic calcification of the abdominal aorta without aneurysm. Hepatoduodenal ligament lymphadenopathy is similar to prior. 15 mm short axis hepatoduodenal ligament lymph node measured previously is 14 mm today on image 63/2. Portal caval node measures 13 mm short axis today, similar to prior. Upper normal retroperitoneal lymph nodes again noted. No pelvic sidewall lymphadenopathy. Reproductive: There is no adnexal mass. Other: No intraperitoneal free fluid. Musculoskeletal: No worrisome lytic or sclerotic osseous abnormality. Small umbilical hernia contains only fat. IMPRESSION: 1. No substantial interval change in size of the right hepatic lobe mass with satellite lesions. Imaging features compatible with known cholangiocarcinoma. 2. Stable appearance of intrahepatic biliary duct dilatation in the right hepatic lobe adjacent to the mass. 3. Stable appearance of mild lymphadenopathy in the hepatoduodenal ligament. 4. Cholelithiasis. 5. Aortic Atherosclerosis (ICD10-I70.0). Electronically Signed   By: EMisty StanleyM.D.   On: 11/17/2021 16:01    ____________________________________________  PROCEDURES   Procedure(s) performed (including Critical  Care):  Procedures  ____________________________________________  INITIAL IMPRESSION / MDM / [place]/ ED COURSE  As part of my medical decision making, I reviewed the following data within the eMenardnotes reviewed and incorporated, Old chart reviewed, Notes from prior ED visits, and  Controlled Substance Database       *CJENEEN DOUTTwas  evaluated in Emergency Department on 11/17/2021 for the symptoms described in the history of present illness. She was evaluated in the context of the global COVID-19 pandemic, which necessitated consideration that the patient might be at risk for infection with the SARS-CoV-2 virus that causes COVID-19. Institutional protocols and algorithms that pertain to the evaluation of patients at risk for COVID-19 are in a state of rapid change based on information released by regulatory bodies including the CDC and federal and state organizations. These policies and algorithms were followed during the patient's care in the ED.  Some ED evaluations and interventions may be delayed as a result of limited staffing during the pandemic.*     Medical Decision Making: Well-appearing 60 year old female here with elevated bilirubin in the setting of known cholangiocarcinoma receiving active chemotherapy.  I reviewed her labs from oncology clinic.  White blood cell count is normal.  Hemoglobin is near baseline.  Platelets 112, slightly below her last 192, but she is receiving active chemotherapy.  CMP shows likely mild dehydration, slight elevation in bilirubin of 3.8, up from 1.51-week ago.  LFTs appear largely unchanged.  Creatinine appears similar to last week.  Will plan to obtain CT for evaluation of recurrent or worsening obstruction or other acute changes in her cancer, otherwise plan to treat supportively.  ____________________________________________  FINAL CLINICAL IMPRESSION(S) / ED DIAGNOSES  Final diagnoses:  Elevated  bilirubin     MEDICATIONS GIVEN DURING THIS VISIT:  Medications  sodium chloride 0.9 % bolus 1,000 mL (0 mLs Intravenous Stopped 11/17/21 1515)  iohexol (OMNIPAQUE) 300 MG/ML solution 100 mL (100 mLs Intravenous Contrast Given 11/17/21 1528)  heparin lock flush 100 unit/mL (500 Units Intravenous Given 11/17/21 1630)     ED Discharge Orders     None        Note:  This document was prepared using Dragon voice recognition software and may include unintentional dictation errors.   Duffy Bruce, MD 11/17/21 [phone]

## 2021-11-17 NOTE — Progress Notes (Signed)
Message sent to Beckey Rutter, NP and Pharmacist Debbora Lacrosse this pt's total bili is above the parameters for tx, parameters say hold for t bil > 1.5, pt's t bili today is 3.8, she is getting a new tx carboplatin today instead of cisplatin to go along with her gemzar. Give tx or hold?  Pt sent to ER (she will drive herself) for more lab work and stat scan. Alease Medina NP over to speak with pt and encourage her to go to ER. Port left accessed for ER.

## 2021-11-17 NOTE — ED Triage Notes (Addendum)
Pt was sent from the [facility] for abnormal leverl enzymes, pt is currently under chemotherapy 2 mondays and the 3rd Monday off, last treatment was last week. Pt does have yellow sclera.  Denies any abd pain , N/V.   Pt arrives from [facility] with porta cath accessed.

## 2021-11-17 NOTE — ED Notes (Signed)
Pt refusing DC vitals at this time.

## 2021-11-17 NOTE — ED Triage Notes (Signed)
First Nurse Note:  Arrives from [facility] for ED evaluation. Per report, patient's Tbili elevated.

## 2021-11-17 NOTE — ED Notes (Signed)
Port de accessed at this time.

## 2021-11-18 ENCOUNTER — Other Ambulatory Visit: Payer: Self-pay

## 2021-11-18 DIAGNOSIS — C221 Intrahepatic bile duct carcinoma: Secondary | ICD-10-CM

## 2021-11-19 ENCOUNTER — Telehealth: Payer: Self-pay | Admitting: Oncology

## 2021-11-19 NOTE — Telephone Encounter (Signed)
Called patient to speak with her about lab work for this week and her rescheduled treatment from 12/12 (patient had to go to the ED) to 12/22. Patient stated that she "doesn't have time" to come in next week and will not be coming back to the cancer center until 12/27 (her previously scheduled treatment day). I emphasized to patient the need to have her labs monitored closely and again she declined the appointment.

## 2021-11-20 ENCOUNTER — Inpatient Hospital Stay: Payer: Medicare HMO

## 2021-11-25 ENCOUNTER — Ambulatory Visit: Payer: Medicare HMO

## 2021-11-26 ENCOUNTER — Telehealth: Payer: Self-pay

## 2021-11-26 NOTE — Telephone Encounter (Signed)
After several attempts to contact spoke with patient to schedule Venous DVT u/s .   She declined and states nothing is wrong with her legs.   Removed order from [facility]

## 2021-11-27 ENCOUNTER — Inpatient Hospital Stay: Payer: Medicare HMO

## 2021-11-27 ENCOUNTER — Other Ambulatory Visit: Payer: Self-pay

## 2021-11-27 ENCOUNTER — Inpatient Hospital Stay (HOSPITAL_BASED_OUTPATIENT_CLINIC_OR_DEPARTMENT_OTHER): Payer: Medicare HMO | Admitting: Oncology

## 2021-11-27 VITALS — BP 122/69 | HR 63 | Temp 98.1°F | Resp 16 | Wt 155.8 lb

## 2021-11-27 DIAGNOSIS — C221 Intrahepatic bile duct carcinoma: Secondary | ICD-10-CM | POA: Diagnosis not present

## 2021-11-27 DIAGNOSIS — Z90710 Acquired absence of both cervix and uterus: Secondary | ICD-10-CM | POA: Diagnosis not present

## 2021-11-27 DIAGNOSIS — R17 Unspecified jaundice: Secondary | ICD-10-CM | POA: Diagnosis not present

## 2021-11-27 DIAGNOSIS — R69 Illness, unspecified: Secondary | ICD-10-CM | POA: Diagnosis not present

## 2021-11-27 DIAGNOSIS — Z5111 Encounter for antineoplastic chemotherapy: Secondary | ICD-10-CM | POA: Diagnosis not present

## 2021-11-27 DIAGNOSIS — I10 Essential (primary) hypertension: Secondary | ICD-10-CM | POA: Diagnosis not present

## 2021-11-27 DIAGNOSIS — E119 Type 2 diabetes mellitus without complications: Secondary | ICD-10-CM | POA: Diagnosis not present

## 2021-11-27 LAB — COMPREHENSIVE METABOLIC PANEL
ALT: 32 U/L (ref 0–44)
AST: 45 U/L — ABNORMAL HIGH (ref 15–41)
Albumin: 2.7 g/dL — ABNORMAL LOW (ref 3.5–5.0)
Alkaline Phosphatase: 446 U/L — ABNORMAL HIGH (ref 38–126)
Anion gap: 7 (ref 5–15)
BUN: 19 mg/dL (ref 8–23)
CO2: 21 mmol/L — ABNORMAL LOW (ref 22–32)
Calcium: 8.4 mg/dL — ABNORMAL LOW (ref 8.9–10.3)
Chloride: 107 mmol/L (ref 98–111)
Creatinine, Ser: 1.24 mg/dL — ABNORMAL HIGH (ref 0.44–1.00)
GFR, Estimated: 49 mL/min — ABNORMAL LOW (ref >60)
Glucose, Bld: 128 mg/dL — ABNORMAL HIGH (ref 70–99)
Potassium: 4.6 mmol/L (ref 3.5–5.1)
Sodium: 135 mmol/L (ref 135–145)
Total Bilirubin: 2.1 mg/dL — ABNORMAL HIGH (ref 0.3–1.2)
Total Protein: 7.8 g/dL (ref 6.5–8.1)

## 2021-11-27 LAB — CBC WITH DIFFERENTIAL/PLATELET
Abs Immature Granulocytes: 0.02 10*3/uL (ref 0.00–0.07)
Basophils Absolute: 0 10*3/uL (ref 0.0–0.1)
Basophils Relative: 0 %
Eosinophils Absolute: 0.2 10*3/uL (ref 0.0–0.5)
Eosinophils Relative: 2 %
HCT: 25.6 % — ABNORMAL LOW (ref 36.0–46.0)
Hemoglobin: 8.3 g/dL — ABNORMAL LOW (ref 12.0–15.0)
Immature Granulocytes: 0 %
Lymphocytes Relative: 20 %
Lymphs Abs: 1.7 10*3/uL (ref 0.7–4.0)
MCH: 29.9 pg (ref 26.0–34.0)
MCHC: 32.4 g/dL (ref 30.0–36.0)
MCV: 92.1 fL (ref 80.0–100.0)
Monocytes Absolute: 0.9 10*3/uL (ref 0.1–1.0)
Monocytes Relative: 11 %
Neutro Abs: 5.4 10*3/uL (ref 1.7–7.7)
Neutrophils Relative %: 67 %
Platelets: 322 10*3/uL (ref 150–400)
RBC: 2.78 MIL/uL — ABNORMAL LOW (ref 3.87–5.11)
RDW: 28.5 % — ABNORMAL HIGH (ref 11.5–15.5)
WBC: 8.2 10*3/uL (ref 4.0–10.5)
nRBC: 0 % (ref 0.0–0.2)

## 2021-11-27 LAB — MAGNESIUM: Magnesium: 2.3 mg/dL (ref 1.7–2.4)

## 2021-11-27 MED ORDER — SODIUM CHLORIDE 0.9 % IV SOLN
10.0000 mg | Freq: Once | INTRAVENOUS | Status: AC
  Administered 2021-11-27: 10:00:00 10 mg via INTRAVENOUS
  Filled 2021-11-27: qty 10

## 2021-11-27 MED ORDER — SODIUM CHLORIDE 0.9 % IV SOLN
Freq: Once | INTRAVENOUS | Status: DC
  Filled 2021-11-27: qty 250

## 2021-11-27 MED ORDER — SODIUM CHLORIDE 0.9% FLUSH
10.0000 mL | Freq: Once | INTRAVENOUS | Status: AC
  Administered 2021-11-27: 09:00:00 10 mL via INTRAVENOUS
  Filled 2021-11-27: qty 10

## 2021-11-27 MED ORDER — PALONOSETRON HCL INJECTION 0.25 MG/5ML
0.2500 mg | Freq: Once | INTRAVENOUS | Status: AC
  Administered 2021-11-27: 10:00:00 0.25 mg via INTRAVENOUS

## 2021-11-27 MED ORDER — PEGFILGRASTIM 6 MG/0.6ML ~~LOC~~ PSKT
6.0000 mg | PREFILLED_SYRINGE | Freq: Once | SUBCUTANEOUS | Status: AC
  Administered 2021-11-27: 12:00:00 6 mg via SUBCUTANEOUS
  Filled 2021-11-27: qty 0.6

## 2021-11-27 MED ORDER — HEPARIN SOD (PORK) LOCK FLUSH 100 UNIT/ML IV SOLN
500.0000 [IU] | Freq: Once | INTRAVENOUS | Status: AC
  Administered 2021-11-27: 12:00:00 500 [IU] via INTRAVENOUS
  Filled 2021-11-27: qty 5

## 2021-11-27 MED ORDER — SODIUM CHLORIDE 0.9 % IV SOLN
151.2000 mg | Freq: Once | INTRAVENOUS | Status: AC
  Administered 2021-11-27: 11:00:00 150 mg via INTRAVENOUS
  Filled 2021-11-27: qty 15

## 2021-11-27 MED ORDER — SODIUM CHLORIDE 0.9 % IV SOLN
1400.0000 mg | Freq: Once | INTRAVENOUS | Status: AC
  Administered 2021-11-27: 10:00:00 1400 mg via INTRAVENOUS
  Filled 2021-11-27: qty 26.3

## 2021-11-27 MED ORDER — SODIUM CHLORIDE 0.9 % IV SOLN
Freq: Once | INTRAVENOUS | Status: AC
  Filled 2021-11-27: qty 250

## 2021-11-27 NOTE — Progress Notes (Signed)
Hematology/Oncology Consult note [facility]  Telephone:([phone] [phone]  Patient Care Team: Steele Sizer, MD as PCP - General (Family Medicine) Clent Jacks, RN as Oncology Nurse Navigator [place], [place], LCSW as Social Worker Germaine Pomfret, [facility] (Pharmacist)   Name of the patient: Janice Short  [id number]  [birth year]   Date of visit: 11/28/21  Diagnosis- metastatic intrahepatic cholangiocarcinoma  Chief complaint/ Reason for visit-on treatment assessment prior to cycle 8-day 7 of gemcitabine carbo chemotherapy  Heme/Onc history: Patient is a 60 year old African-American female with a past medical history significant for hypertension hyperlipidemia and type 2 diabetes who presented to the ER with symptoms of cramping abdominal pain and nausea.  This was followed by right upper quadrant ultrasound which showed multiple echogenic masses in the liver with nodular contour of the liver possibly suggestive of cirrhosis.  Several nodular masses in the vicinity of the pancreatic head and porta hepatic lymph nodes.  This was followed by an MRI abdomen and MRCP.  It showed right hepatic lobe masslike area measuring 4.9 x 2.9 cm.  Diffusion abnormality in the left hepatic lobe with mild left-sided biliary ductal dilatation and another area of abnormality measuring 1.4 x 1 cm.  Bulky celiac adenopathy 3 x 2.1 cm tracking into gastrohepatic recess.  Suspected portal venous invasion with convex protrusion in the right portal vein.  Left adrenal lesion measuring 2.9 x 1.5 cm.  Findings concerning for cholangiocarcinoma or biphenotypic cholangiole hepatocellular neoplasm.     Patient underwent CT-guided liver biopsy Which was consistent with adenocarcinoma poorly differentiated.  Immunohistochemistry showed CK7 positivity, CDX2 negative, Heppar negative, GATA3 negative.  CK19 positive.  Differentials include cholangiocarcinoma, pancreatic, upper GI.   Lung and breast less likely.  However given CK19 positivity most compatible with cholangiocarcinoma.   Patient's case was discussed at tumor board and it was felt as if the lymphadenopathy was more like a confluent primary tumor mass.  Patient was referred to [facility] for second opinion to see if she would be a candidate for surgery.However upon their review of the PET scan it was deemed that this was indeed periportal adenopathy.  They also did a CT chest abdomen and pelvis with MIPS protocol and she was also found to have portocaval lymph node measuring 2.4 cm which was more consistent with metastatic disease.  She was deemed unresectable   NGS testing showed BRCA2, FGFR2, PIK3CA, RAD 54L, CDC73, CDKN2 A/B CR EBBP rearrangement exon 31    Patient has been getting gemcitabine cisplatin chemotherapy 2 weeks on 1 week off since August 2022  Interval history-patient's last cycle was delayed secondary to elevated bilirubin 3.8.  Was evaluated in the emergency room and had imaging that did not reveal any acute abnormality.  She returns to clinic today for reevaluation prior to next cycle of carbo/gemcitabine chemotherapy.  Cisplatin discontinued secondary to reaction.   ECOG PS- 1 Pain scale- 0 Opioid associated constipation- no  Review of systems- Review of Systems  Constitutional: Negative.  Negative for chills, fever, malaise/fatigue and weight loss.  HENT:  Negative for congestion, ear pain and tinnitus.   Eyes: Negative.  Negative for blurred vision and double vision.  Respiratory: Negative.  Negative for cough, sputum production and shortness of breath.   Cardiovascular: Negative.  Negative for chest pain, palpitations and leg swelling.  Gastrointestinal: Negative.  Negative for abdominal pain, constipation, diarrhea, nausea and vomiting.  Genitourinary:  Negative for dysuria, frequency and urgency.  Musculoskeletal:  Negative  for back pain and falls.  Skin: Negative.  Negative for rash.   Neurological: Negative.  Negative for weakness and headaches.  Endo/Heme/Allergies: Negative.  Does not bruise/bleed easily.  Psychiatric/Behavioral: Negative.  Negative for depression. The patient is not nervous/anxious and does not have insomnia.       Allergies  Allergen Reactions   Other Shortness Of Breath    Cat dander and grass pollen   Cisplatin Itching   Bydureon [Exenatide] Other (See Comments)    Pain with injection not allergy     Past Medical History:  Diagnosis Date   Allergy    Asthma    Carpal tunnel syndrome on left    Cervical radiculitis    Chronic osteoarthritis    Corns and callosity    Depression    Dermatophytosis of foot    Diabetes mellitus without complication (HCC)    Gout    Hyperlipidemia    Hypertension    Impingement syndrome of left shoulder    Intrahepatic cholangiocarcinoma (HCC)    Lumbosacral neuritis    Microalbuminuria    Obesity    Supraventricular tachycardia ([place]) 07/01/2015   SVT (supraventricular tachycardia) (HCC)    Tenosynovitis of wrist    Vitamin D deficiency      Past Surgical History:  Procedure Laterality Date   ABDOMINAL HYSTERECTOMY     COLONOSCOPY WITH PROPOFOL N/A 05/06/2021   Procedure: COLONOSCOPY WITH PROPOFOL;  Surgeon: Lucilla Lame, MD;  Location: [facility] ENDOSCOPY;  Service: Endoscopy;  Laterality: N/A;   ESOPHAGOGASTRODUODENOSCOPY (EGD) WITH PROPOFOL N/A 05/06/2021   Procedure: ESOPHAGOGASTRODUODENOSCOPY (EGD) WITH PROPOFOL;  Surgeon: Lucilla Lame, MD;  Location: [facility] ENDOSCOPY;  Service: Endoscopy;  Laterality: N/A;   PORTA CATH INSERTION N/A 07/17/2021   Procedure: PORTA CATH INSERTION;  Surgeon: Algernon Huxley, MD;  Location: [place] [facility];  Service: Cardiovascular;  Laterality: N/A;    Social History   Socioeconomic History   Marital status: Single    Spouse name: Not on file   Number of children: 2   Years of education: Not on file   Highest education level: Not on file  Occupational  History   Occupation: disable     Comment: from chronic back pain, 2019  Tobacco Use   Smoking status: Some Days    Types: Cigarettes   Smokeless tobacco: Never   Tobacco comments:    2 per day 01/30/21  Vaping Use   Vaping Use: Never used  Substance and Sexual Activity   Alcohol use: Yes    Alcohol/week: 0.0 standard drinks    Comment: rare maybe 1-2  year   Drug use: No   Sexual activity: Yes  Other Topics Concern   Not on file  Social History Narrative   She is on disability for chronic back pain. Pt's son lives with her   Social Determinants of Health   Financial Resource Strain: Medium Risk   Difficulty of Paying Living Expenses: Somewhat hard  Food Insecurity: No Food Insecurity   Worried About Charity fundraiser in the Last Year: Never true   Arboriculturist in the Last Year: Never true  Transportation Needs: No Transportation Needs   Lack of Transportation (Medical): No   Lack of Transportation (Non-Medical): No  Physical Activity: Inactive   Days of Exercise per Week: 0 days   Minutes of Exercise per Session: 0 min  Stress: Stress Concern Present   Feeling of Stress : To some extent  Social Connections: Moderately Isolated  Frequency of Communication with Friends and Family: More than three times a week   Frequency of Social Gatherings with Friends and Family: Three times a week   Attends Religious Services: More than 4 times per year   Active Member of Clubs or Organizations: No   Attends Archivist Meetings: Never   Marital Status: Never married  Human resources officer Violence: Not At Risk   Fear of Current or Ex-Partner: No   Emotionally Abused: No   Physically Abused: No   Sexually Abused: No    Family History  Problem Relation Age of Onset   Heart attack Mother    CAD Mother    Kidney disease Father      Current Outpatient Medications:    albuterol (VENTOLIN HFA) 108 (90 Base) MCG/ACT inhaler, INHALE 2 PUFFS BY MOUTH EVERY 6 HOURS AS  NEEDED FOR WHEEZING AND FOR SHORTNESS OF BREATH, Disp: 18 g, Rfl: 1   aspirin 81 MG tablet, Take 1 tablet by mouth daily., Disp: , Rfl:    atorvastatin (LIPITOR) 40 MG tablet, Take 1 tablet (40 mg total) by mouth at bedtime., Disp: 90 tablet, Rfl: 1   blood glucose meter kit and supplies, Dispense based on patient and insurance preference. Use up to four times daily as directed. (FOR ICD-10 E10.9, E11.9)., Disp: 1 each, Rfl: 0   diphenhydramine-acetaminophen (TYLENOL PM) 25-500 MG TABS tablet, Take 1 tablet by mouth at bedtime as needed., Disp: , Rfl:    escitalopram (LEXAPRO) 20 MG tablet, Take 1 tablet (20 mg total) by mouth daily., Disp: 90 tablet, Rfl: 1   fluticasone (FLONASE) 50 MCG/ACT nasal spray, USE 2 SPRAY(S) IN EACH NOSTRIL AT BEDTIME (Patient taking differently: Place 2 sprays into both nostrils daily as needed. USE 2 SPRAY(S) IN EACH NOSTRIL AT BEDTIME), Disp: 16 g, Rfl: 0   Fluticasone-Umeclidin-Vilant (TRELEGY ELLIPTA) 100-62.5-25 MCG/INH AEPB, Take 1 puff by mouth daily., Disp: 180 each, Rfl: 1   gabapentin (NEURONTIN) 300 MG capsule, Take 2 capsules (600 mg total) by mouth 2 (two) times daily., Disp: 360 capsule, Rfl: 1   Insulin Pen Needle 31G X 8 MM MISC, 1 each by Does not apply route daily., Disp: 100 each, Rfl: 1   sodium zirconium cyclosilicate (LOKELMA) 10 g PACK packet, Take 10 g by mouth daily., Disp: 10 each, Rfl: 1   TRESIBA FLEXTOUCH 100 UNIT/ML FlexTouch Pen, INJECT 12-30 UNITS OF INSULIN INTO THE SKIN DAILY TO KEEP FASTING GLUCOSE BETWEEN 100-140, Disp: 15 mL, Rfl: 0   methylPREDNISolone (MEDROL DOSEPAK) 4 MG TBPK tablet, Day 1: Take 8 mg (2 tablets) before breakfast, 4 mg (1 tablet) after lunch, 4 mg (1 tablet) after supper, and 8 mg (2 tablets) at bedtime. Day 2:Take 4 mg (1 tablet) before breakfast, 4 mg (1 tablet) after lunch, 4 mg (1 tablet) after supper, and 8 mg (2 tablets) at bedtime. Day 3: Take 4 mg (1 tablet) before breakfast, 4 mg (1 tablet) after lunch, 4 mg  (1 tablet) after supper, and 4 mg (1 tablet) at bedtime. Day 4: Take 4 mg (1 tablet) before breakfast, 4 mg (1 tablet) after lunch, and 4 mg (1 tablet) at bedtime. Day 5: Take 4 mg (1 tablet) before breakfast and 4 mg (1 tablet) at bedtime. Day 6: Take 4 mg (1 tablet) before breakfast. (Patient not taking: Reported on 11/27/2021), Disp: 1 each, Rfl: 0   ondansetron (ZOFRAN ODT) 4 MG disintegrating tablet, Take 1 tablet (4 mg total) by mouth every 8 (eight)  hours as needed for nausea or vomiting. (Patient not taking: Reported on 10/27/2021), Disp: 20 tablet, Rfl: 0   ondansetron (ZOFRAN) 8 MG tablet, Take 1 tablet (8 mg total) by mouth 2 (two) times daily as needed. Start on the third day after cisplatin chemotherapy. (Patient not taking: Reported on 10/27/2021), Disp: 30 tablet, Rfl: 1   prochlorperazine (COMPAZINE) 10 MG tablet, Take 1 tablet (10 mg total) by mouth every 6 (six) hours as needed (Nausea or vomiting). (Patient not taking: Reported on 10/27/2021), Disp: 30 tablet, Rfl: 1  Physical exam:  Vitals:   11/27/21 0839  BP: 122/69  Pulse: 63  Resp: 16  Temp: 98.1 F (36.7 C)  TempSrc: Oral  SpO2: 100%  Weight: 155 lb 12.8 oz (70.7 kg)   Physical Exam Constitutional:      Appearance: Normal appearance.  HENT:     Head: Normocephalic and atraumatic.  Eyes:     Pupils: Pupils are equal, round, and reactive to light.  Cardiovascular:     Rate and Rhythm: Normal rate and regular rhythm.     Heart sounds: Normal heart sounds. No murmur heard. Pulmonary:     Effort: Pulmonary effort is normal.     Breath sounds: Normal breath sounds. No wheezing.  Abdominal:     General: Bowel sounds are normal. There is no distension.     Palpations: Abdomen is soft.     Tenderness: There is no abdominal tenderness.  Musculoskeletal:        General: Normal range of motion.     Cervical back: Normal range of motion.  Skin:    General: Skin is warm and dry.     Findings: No rash.  Neurological:      Mental Status: She is alert and oriented to person, place, and time.  Psychiatric:        Judgment: Judgment normal.     CMP Latest Ref Rng & Units 11/27/2021  Glucose 70 - 99 mg/dL 128(H)  BUN 8 - 23 mg/dL 19  Creatinine 0.44 - 1.00 mg/dL 1.24(H)  Sodium 135 - 145 mmol/L 135  Potassium 3.5 - 5.1 mmol/L 4.6  Chloride 98 - 111 mmol/L 107  CO2 22 - 32 mmol/L 21(L)  Calcium 8.9 - 10.3 mg/dL 8.4(L)  Total Protein 6.5 - 8.1 g/dL 7.8  Total Bilirubin 0.3 - 1.2 mg/dL 2.1(H)  Alkaline Phos 38 - 126 U/L 446(H)  AST 15 - 41 U/L 45(H)  ALT 0 - 44 U/L 32   CBC Latest Ref Rng & Units 11/27/2021  WBC 4.0 - 10.5 K/uL 8.2  Hemoglobin 12.0 - 15.0 g/dL 8.3(L)  Hematocrit 36.0 - 46.0 % 25.6(L)  Platelets 150 - 400 K/uL 322    Assessment and plan- Patient is a 60 y.o. female with stage IV intrahepatic cholangiocarcinoma.  She is here for assessment prior to cycle 7-day 8 of carbo/gemcitabine.  Cisplatin is currently on hold due to reaction.  Treatment held last week secondary to elevated bilirubin.  Had imaging which did not reveal any abnormality or obstruction.  She returns today for carbo/gemcitabine.  Clinically she is doing well.  Denies any new concerns.  Denies abdominal pain, diarrhea or constipation.  Lab work from today shows improvement of her bilirubin to 2.1 (3.8) and her LFTs.  Continues to have anemia hemoglobin 8.3 which is likely chemo induced.  Continue to monitor.   Hyperkalemia-this has resolved.  Hyperbilirubinemia-evaluated in ED.  Recent CT scan of her abdomen was negative for acute abnormality  or obstruction.  Continue to monitor.  Improvement of bilirubin today.  Disposition- Cycle 7-day 8 carbo/gemcitabine chemotherapy today. Given delay in treatment offered her to come back in 1 week but patient would like for today to be cycle 7-day 8 with 1 week off and return to clinic in 2 weeks for cycle 8-day 1.  Return to clinic on 12/12/2021 for cycle 8-day 1.  I spent 25  minutes dedicated to the care of this patient (face-to-face and non-face-to-face) on the date of the encounter to include what is described in the assessment and plan.   Visit Diagnosis 1. Intrahepatic cholangiocarcinoma ([place])   2. Elevated bilirubin    Faythe Casa, NP 11/28/2021 7:45 AM

## 2021-11-27 NOTE — Progress Notes (Signed)
Per Faythe Casa, NP, OK to proceed with treatment today with bilirubin at 2.1.

## 2021-11-27 NOTE — Patient Instructions (Signed)
[facility] [place]  Discharge Instructions: ?Thank you for choosing [facility] to provide your oncology and hematology care.  ?If you have a lab appointment with the Cancer Center, please go directly to the Cancer Center and check in at the registration area. ? ?Wear comfortable clothing and clothing appropriate for easy access to any Portacath or PICC line.  ? ?We strive to give you quality time with your provider. You may need to reschedule your appointment if you arrive late (15 or more minutes).  Arriving late affects you and other patients whose appointments are after yours.  Also, if you miss three or more appointments without notifying the office, you may be dismissed from the clinic at the provider?s discretion.    ?  ?For prescription refill requests, have your pharmacy contact our office and allow 72 hours for refills to be completed.   ? ?  ?To help prevent nausea and vomiting after your treatment, we encourage you to take your nausea medication as directed. ? ?BELOW ARE SYMPTOMS THAT SHOULD BE REPORTED IMMEDIATELY: ?*FEVER GREATER THAN 100.4 F (38 ?C) OR HIGHER ?*CHILLS OR SWEATING ?*NAUSEA AND VOMITING THAT IS NOT CONTROLLED WITH YOUR NAUSEA MEDICATION ?*UNUSUAL SHORTNESS OF BREATH ?*UNUSUAL BRUISING OR BLEEDING ?*URINARY PROBLEMS (pain or burning when urinating, or frequent urination) ?*BOWEL PROBLEMS (unusual diarrhea, constipation, pain near the anus) ?TENDERNESS IN MOUTH AND THROAT WITH OR WITHOUT PRESENCE OF ULCERS (sore throat, sores in mouth, or a toothache) ?UNUSUAL RASH, SWELLING OR PAIN  ?UNUSUAL VAGINAL DISCHARGE OR ITCHING  ? ?Items with * indicate a potential emergency and should be followed up as soon as possible or go to the Emergency Department if any problems should occur. ? ?Please show the CHEMOTHERAPY ALERT CARD or IMMUNOTHERAPY ALERT CARD at check-in to the Emergency Department and triage nurse. ? ?Should you have questions after your visit  or need to cancel or reschedule your appointment, please contact [facility] [place]  [phone] and follow the prompts.  Office hours are 8:00 a.m. to 4:30 p.m. Monday - Friday. Please note that voicemails left after 4:00 p.m. may not be returned until the following business day.  We are closed weekends and major holidays. You have access to a nurse at all times for urgent questions. Please call the main number to the clinic [phone] and follow the prompts. ? ?For any non-urgent questions, you may also contact your provider using MyChart. We now offer e-Visits for anyone 18 and older to request care online for non-urgent symptoms. For details visit [email or web address]. ?  ?Also download the MyChart app! Go to the app store, search "MyChart", open the app, select [facility], and log in with your MyChart username and password. ? ?Due to Covid, a mask is required upon entering the hospital/clinic. If you do not have a mask, one will be given to you upon arrival. For doctor visits, patients may have 1 support person aged 18 or older with them. For treatment visits, patients cannot have anyone with them due to current Covid guidelines and our immunocompromised population.  ?

## 2021-11-28 ENCOUNTER — Encounter: Payer: Self-pay | Admitting: Oncology

## 2021-12-02 ENCOUNTER — Ambulatory Visit: Payer: Medicare HMO | Admitting: Oncology

## 2021-12-02 ENCOUNTER — Inpatient Hospital Stay: Payer: Medicare HMO

## 2021-12-02 ENCOUNTER — Telehealth: Payer: Medicare HMO

## 2021-12-02 ENCOUNTER — Other Ambulatory Visit: Payer: Medicare HMO

## 2021-12-02 ENCOUNTER — Ambulatory Visit: Payer: Medicare HMO

## 2021-12-02 NOTE — Progress Notes (Signed)
Nutrition Follow-up:  Patient with stage IV intrahepatic cholangiocarcinoma.  Patient receiving cisplatin and gemcitabine.    Spoke with patient via phone for nutrition follow-up.  She reports she is eating well. Currently has a good appetite and no concerns with nausea or vomiting. Her bowels were fine until this morning she has had 3 bowel movements but she believes this due to over eating during the Christmas holiday.  Her weight has been steady over past 3 weeks. She is eating a variety of foods. Recent PO intake: B: tenderloin biscuit, hash browns and apples. Loves oranges but has been told to avoid because her potassium had been running high. L: usually PBJ, D: steak and potatoes. Enjoyed a Public relations account executive with mac and cheese with pumpkin pie for dessert at Christmas and still has leftovers.  She been drinking lots of water.  She feels good at this time. Encouraged continue balanced meals with good protein and iron sources. Denies nausea, constipation or diarrhea.      Medications: reviewed  Labs: reviewed  Anthropometrics:   Weight /BMI 11/27/2021 11/17/2021 11/17/2021 11/13/2021  WEIGHT 155 lb 12.8 oz 155 lb 154 lb 6.4 oz 155 lb 11.2 oz  HEIGHT  _0   _1   BMI 24.4 kg/m2 24.28 kg/m2 24.18 kg/m2 24.39 kg/m2   Weight /BMI 11/10/2021  WEIGHT 157 lb 6.4 oz  HEIGHT   BMI 24.65 kg/m2     NUTRITION DIAGNOSIS: Inadequate oral intake stable x 3 weeks  INTERVENTION:  Encouraged patient to eat well balanced diet including good sources protein for weight maintenance.  Provided contact information for Joli B. Zenia Resides, RD, LDN, Registered [phone] (mobile) if nutritional intake changes and she needs support.    MONITORING, EVALUATION, GOAL: weight trends, intake   NEXT VISIT: To be determined as able to coincide with future appointments.     April Manson, RDN, LDN Registered Dietitian, [place] Part Time Remote (Usual office hours: Tuesday-Thursday) Cell:  [phone]

## 2021-12-10 ENCOUNTER — Other Ambulatory Visit: Payer: Self-pay

## 2021-12-10 ENCOUNTER — Inpatient Hospital Stay: Payer: Medicare HMO | Attending: Nurse Practitioner | Admitting: Hospice and Palliative Medicine

## 2021-12-10 DIAGNOSIS — E119 Type 2 diabetes mellitus without complications: Secondary | ICD-10-CM | POA: Insufficient documentation

## 2021-12-10 DIAGNOSIS — Z515 Encounter for palliative care: Secondary | ICD-10-CM

## 2021-12-10 DIAGNOSIS — E538 Deficiency of other specified B group vitamins: Secondary | ICD-10-CM | POA: Insufficient documentation

## 2021-12-10 DIAGNOSIS — C221 Intrahepatic bile duct carcinoma: Secondary | ICD-10-CM | POA: Diagnosis not present

## 2021-12-10 DIAGNOSIS — Z79899 Other long term (current) drug therapy: Secondary | ICD-10-CM | POA: Insufficient documentation

## 2021-12-10 DIAGNOSIS — D509 Iron deficiency anemia, unspecified: Secondary | ICD-10-CM | POA: Insufficient documentation

## 2021-12-10 DIAGNOSIS — Z5111 Encounter for antineoplastic chemotherapy: Secondary | ICD-10-CM | POA: Insufficient documentation

## 2021-12-10 DIAGNOSIS — Z90710 Acquired absence of both cervix and uterus: Secondary | ICD-10-CM | POA: Insufficient documentation

## 2021-12-10 DIAGNOSIS — I10 Essential (primary) hypertension: Secondary | ICD-10-CM | POA: Insufficient documentation

## 2021-12-10 DIAGNOSIS — D6481 Anemia due to antineoplastic chemotherapy: Secondary | ICD-10-CM | POA: Insufficient documentation

## 2021-12-10 NOTE — Progress Notes (Signed)
Virtual Visit via Telephone Note  I connected with [name] on 12/10/21 at  2:00 PM EST by telephone and verified that I am speaking with the correct person using two identifiers.  Location: Patient: Home Provider: Clinic   I discussed the limitations, risks, security and privacy concerns of performing an evaluation and management service by telephone and the availability of in person appointments. I also discussed with the patient that there may be a patient responsible charge related to this service. The patient expressed understanding and agreed to proceed.   History of Present Illness: Janice Short is a 61 y.o. female with multiple medical problems including hypertension, hyperlipidemia, and type 2 diabetes, who was recently diagnosed with cholangiocarcinoma.  Patient was initially unsure regarding treatment options.  She was referred to palliative care to help address goals.   Observations/Objective:   Spoke with patient by phone.  She reports she is doing well.  She denies any significant changes or concerns.  No symptomatic complaints at present.  She tells me about the reaction she had to chemotherapy recently but remains committed to pursuing treatment.  She reports good appetite and stable weight.  She still feels like her quality of life is acceptable.  She describes having had a good Christmas with ability to spend time with family and grandchildren.  Assessment and Plan: Stage IV cholangiocarcinoma -on systemic chemotherapy.  Seems to be doing well clinically without any significant symptomatic burden at present.  Will follow.  Follow Up Instructions: Follow-up telephone visit 1 to 2 months   I discussed the assessment and treatment plan with the patient. The patient was provided an opportunity to ask questions and all were answered. The patient agreed with the plan and demonstrated an understanding of the instructions.   The patient was advised to call back or seek an  in-person evaluation if the symptoms worsen or if the condition fails to improve as anticipated.  I provided 5 minutes of non-face-to-face time during this encounter.   Irean Hong, NP

## 2021-12-12 ENCOUNTER — Inpatient Hospital Stay: Payer: Medicare HMO

## 2021-12-12 ENCOUNTER — Inpatient Hospital Stay (HOSPITAL_BASED_OUTPATIENT_CLINIC_OR_DEPARTMENT_OTHER): Payer: Medicare HMO | Admitting: Oncology

## 2021-12-12 ENCOUNTER — Encounter: Payer: Self-pay | Admitting: Oncology

## 2021-12-12 ENCOUNTER — Other Ambulatory Visit: Payer: Self-pay

## 2021-12-12 VITALS — BP 145/69 | HR 82

## 2021-12-12 VITALS — BP 142/75 | HR 81 | Temp 98.2°F | Resp 16 | Wt 154.6 lb

## 2021-12-12 DIAGNOSIS — C221 Intrahepatic bile duct carcinoma: Secondary | ICD-10-CM | POA: Diagnosis not present

## 2021-12-12 DIAGNOSIS — I10 Essential (primary) hypertension: Secondary | ICD-10-CM | POA: Diagnosis not present

## 2021-12-12 DIAGNOSIS — Z90710 Acquired absence of both cervix and uterus: Secondary | ICD-10-CM | POA: Diagnosis not present

## 2021-12-12 DIAGNOSIS — D509 Iron deficiency anemia, unspecified: Secondary | ICD-10-CM | POA: Diagnosis not present

## 2021-12-12 DIAGNOSIS — Z5111 Encounter for antineoplastic chemotherapy: Secondary | ICD-10-CM | POA: Diagnosis not present

## 2021-12-12 DIAGNOSIS — D6481 Anemia due to antineoplastic chemotherapy: Secondary | ICD-10-CM | POA: Diagnosis not present

## 2021-12-12 DIAGNOSIS — Z79899 Other long term (current) drug therapy: Secondary | ICD-10-CM | POA: Diagnosis not present

## 2021-12-12 DIAGNOSIS — E538 Deficiency of other specified B group vitamins: Secondary | ICD-10-CM | POA: Diagnosis not present

## 2021-12-12 DIAGNOSIS — E119 Type 2 diabetes mellitus without complications: Secondary | ICD-10-CM | POA: Diagnosis not present

## 2021-12-12 LAB — CBC WITH DIFFERENTIAL/PLATELET
Abs Immature Granulocytes: 0.4 10*3/uL — ABNORMAL HIGH (ref 0.00–0.07)
Basophils Absolute: 0.1 10*3/uL (ref 0.0–0.1)
Basophils Relative: 1 %
Eosinophils Absolute: 0.2 10*3/uL (ref 0.0–0.5)
Eosinophils Relative: 1 %
HCT: 27.6 % — ABNORMAL LOW (ref 36.0–46.0)
Hemoglobin: 8.7 g/dL — ABNORMAL LOW (ref 12.0–15.0)
Immature Granulocytes: 2 %
Lymphocytes Relative: 13 %
Lymphs Abs: 3.1 10*3/uL (ref 0.7–4.0)
MCH: 31.6 pg (ref 26.0–34.0)
MCHC: 31.5 g/dL (ref 30.0–36.0)
MCV: 100.4 fL — ABNORMAL HIGH (ref 80.0–100.0)
Monocytes Absolute: 1.8 10*3/uL — ABNORMAL HIGH (ref 0.1–1.0)
Monocytes Relative: 7 %
Neutro Abs: 19 10*3/uL — ABNORMAL HIGH (ref 1.7–7.7)
Neutrophils Relative %: 76 %
Platelets: 229 10*3/uL (ref 150–400)
RBC: 2.75 MIL/uL — ABNORMAL LOW (ref 3.87–5.11)
RDW: 28.9 % — ABNORMAL HIGH (ref 11.5–15.5)
WBC: 24.8 10*3/uL — ABNORMAL HIGH (ref 4.0–10.5)
nRBC: 0.2 % (ref 0.0–0.2)

## 2021-12-12 LAB — COMPREHENSIVE METABOLIC PANEL
ALT: 27 U/L (ref 0–44)
AST: 38 U/L (ref 15–41)
Albumin: 2.5 g/dL — ABNORMAL LOW (ref 3.5–5.0)
Alkaline Phosphatase: 577 U/L — ABNORMAL HIGH (ref 38–126)
Anion gap: 6 (ref 5–15)
BUN: 14 mg/dL (ref 8–23)
CO2: 22 mmol/L (ref 22–32)
Calcium: 8.4 mg/dL — ABNORMAL LOW (ref 8.9–10.3)
Chloride: 107 mmol/L (ref 98–111)
Creatinine, Ser: 1.09 mg/dL — ABNORMAL HIGH (ref 0.44–1.00)
GFR, Estimated: 57 mL/min — ABNORMAL LOW (ref >60)
Glucose, Bld: 208 mg/dL — ABNORMAL HIGH (ref 70–99)
Potassium: 4.2 mmol/L (ref 3.5–5.1)
Sodium: 135 mmol/L (ref 135–145)
Total Bilirubin: 1.4 mg/dL — ABNORMAL HIGH (ref 0.3–1.2)
Total Protein: 8 g/dL (ref 6.5–8.1)

## 2021-12-12 LAB — BILIRUBIN, FRACTIONATED(TOT/DIR/INDIR)
Bilirubin, Direct: 0.6 mg/dL — ABNORMAL HIGH (ref 0.0–0.2)
Indirect Bilirubin: 0.6 mg/dL (ref 0.3–0.9)
Total Bilirubin: 1.2 mg/dL (ref 0.3–1.2)

## 2021-12-12 LAB — MAGNESIUM: Magnesium: 2.1 mg/dL (ref 1.7–2.4)

## 2021-12-12 MED ORDER — SODIUM CHLORIDE 0.9 % IV SOLN
165.2000 mg | Freq: Once | INTRAVENOUS | Status: AC
  Administered 2021-12-12: 170 mg via INTRAVENOUS
  Filled 2021-12-12: qty 17

## 2021-12-12 MED ORDER — SODIUM CHLORIDE 0.9 % IV SOLN
Freq: Once | INTRAVENOUS | Status: AC
  Filled 2021-12-12: qty 250

## 2021-12-12 MED ORDER — PALONOSETRON HCL INJECTION 0.25 MG/5ML
0.2500 mg | Freq: Once | INTRAVENOUS | Status: AC
  Administered 2021-12-12: 0.25 mg via INTRAVENOUS

## 2021-12-12 MED ORDER — SODIUM CHLORIDE 0.9 % IV SOLN
10.0000 mg | Freq: Once | INTRAVENOUS | Status: AC
  Administered 2021-12-12: 10 mg via INTRAVENOUS
  Filled 2021-12-12: qty 10

## 2021-12-12 MED ORDER — SODIUM CHLORIDE 0.9 % IV SOLN
1400.0000 mg | Freq: Once | INTRAVENOUS | Status: AC
  Administered 2021-12-12: 1400 mg via INTRAVENOUS
  Filled 2021-12-12: qty 26.3

## 2021-12-12 MED ORDER — HEPARIN SOD (PORK) LOCK FLUSH 100 UNIT/ML IV SOLN
500.0000 [IU] | Freq: Once | INTRAVENOUS | Status: AC | PRN
  Administered 2021-12-12: 500 [IU]
  Filled 2021-12-12: qty 5

## 2021-12-12 NOTE — Progress Notes (Signed)
Hematology/Oncology Consult note [facility]  [phone] [phone]  Patient Care Team: Steele Sizer, MD as PCP - General (Family Medicine) Clent Jacks, RN as Oncology Nurse Navigator [place], [place], LCSW as Social Worker Germaine Pomfret, [facility] (Pharmacist)   Name of the patient: Janice Short  [id number]  1961-06-28   Date of visit: 12/12/21  Diagnosis- metastatic intrahepatic cholangiocarcinoma    Chief complaint/ Reason for visit-on treatment assessment prior to cycle 8-day 1 of gemcitabine cisplatin chemotherapy  Heme/Onc history:  Patient is a 61 year old African-American female with a past medical history significant for hypertension hyperlipidemia and type 2 diabetes who presented to the ER with symptoms of cramping abdominal pain and nausea.  This was followed by right upper quadrant ultrasound which showed multiple echogenic masses in the liver with nodular contour of the liver possibly suggestive of cirrhosis.  Several nodular masses in the vicinity of the pancreatic head and porta hepatic lymph nodes.  This was followed by an MRI abdomen and MRCP.  It showed right hepatic lobe masslike area measuring 4.9 x 2.9 cm.  Diffusion abnormality in the left hepatic lobe with mild left-sided biliary ductal dilatation and another area of abnormality measuring 1.4 x 1 cm.  Bulky celiac adenopathy 3 x 2.1 cm tracking into gastrohepatic recess.  Suspected portal venous invasion with convex protrusion in the right portal vein.  Left adrenal lesion measuring 2.9 x 1.5 cm.  Findings concerning for cholangiocarcinoma or biphenotypic cholangiole hepatocellular neoplasm.     Patient underwent CT-guided liver biopsy Which was consistent with adenocarcinoma poorly differentiated.  Immunohistochemistry showed CK7 positivity, CDX2 negative, Heppar negative, GATA3 negative.  CK19 positive.  Differentials include cholangiocarcinoma, pancreatic, upper  GI.  Lung and breast less likely.  However given CK19 positivity most compatible with cholangiocarcinoma.   Patient's case was discussed at tumor board and it was felt as if the lymphadenopathy was more like a confluent primary tumor mass.  Patient was referred to [facility] for second opinion to see if she would be a candidate for surgery.However upon their review of the PET scan it was deemed that this was indeed periportal adenopathy.  They also did a CT chest abdomen and pelvis with MIPS protocol and she was also found to have portocaval lymph node measuring 2.4 cm which was more consistent with metastatic disease.  She was deemed unresectable   NGS testing showed BRCA2, FGFR2, PIK3CA, RAD 54L, CDC73, CDKN2 A/B CR EBBP rearrangement exon 31    Patient has been getting gemcitabine cisplatin chemotherapy 2 weeks on 1 week off since August 2022  Interval history-patient is tolerating treatment well.  Has ongoing mild fatigue but denies any new complaints at this time.  Appetite and weight have remained stable.  ECOG PS- 1 Pain scale- 0 Opioid associated constipation- no  Review of systems- Review of Systems  Constitutional:  Positive for malaise/fatigue. Negative for chills, fever and weight loss.  HENT:  Negative for congestion, ear discharge and nosebleeds.   Eyes:  Negative for blurred vision.  Respiratory:  Negative for cough, hemoptysis, sputum production, shortness of breath and wheezing.   Cardiovascular:  Negative for chest pain, palpitations, orthopnea and claudication.  Gastrointestinal:  Negative for abdominal pain, blood in stool, constipation, diarrhea, heartburn, melena, nausea and vomiting.  Genitourinary:  Negative for dysuria, flank pain, frequency, hematuria and urgency.  Musculoskeletal:  Negative for back pain, joint pain and myalgias.  Skin:  Negative for rash.  Neurological:  Negative  for dizziness, tingling, focal weakness, seizures, weakness and headaches.   Endo/Heme/Allergies:  Does not bruise/bleed easily.  Psychiatric/Behavioral:  Negative for depression and suicidal ideas. The patient does not have insomnia.       Allergies  Allergen Reactions   Other Shortness Of Breath    Cat dander and grass pollen   Cisplatin Itching   Bydureon [Exenatide] Other (See Comments)    Pain with injection not allergy     Past Medical History:  Diagnosis Date   Allergy    Asthma    Carpal tunnel syndrome on left    Cervical radiculitis    Chronic osteoarthritis    Corns and callosity    Depression    Dermatophytosis of foot    Diabetes mellitus without complication (HCC)    Gout    Hyperlipidemia    Hypertension    Impingement syndrome of left shoulder    Intrahepatic cholangiocarcinoma (HCC)    Lumbosacral neuritis    Microalbuminuria    Obesity    Supraventricular tachycardia ([place]) 07/01/2015   SVT (supraventricular tachycardia) (HCC)    Tenosynovitis of wrist    Vitamin D deficiency      Past Surgical History:  Procedure Laterality Date   ABDOMINAL HYSTERECTOMY     COLONOSCOPY WITH PROPOFOL N/A 05/06/2021   Procedure: COLONOSCOPY WITH PROPOFOL;  Surgeon: Lucilla Lame, MD;  Location: [facility] ENDOSCOPY;  Service: Endoscopy;  Laterality: N/A;   ESOPHAGOGASTRODUODENOSCOPY (EGD) WITH PROPOFOL N/A 05/06/2021   Procedure: ESOPHAGOGASTRODUODENOSCOPY (EGD) WITH PROPOFOL;  Surgeon: Lucilla Lame, MD;  [facility] ENDOSCOPY;  Service: Endoscopy;  Laterality: N/A;   PORTA CATH INSERTION N/A 07/17/2021   Procedure: PORTA CATH INSERTION;  Surgeon: Algernon Huxley, MD;  Location: [facility] LAB;  Service: Cardiovascular;  Laterality: N/A;    Social History   Socioeconomic History   Marital status: Single    Spouse name: Not on file   Number of children: 2   Years of education: Not on file   Highest education level: Not on file  Occupational History   Occupation: disable     Comment: from chronic back pain, 2019  Tobacco Use   Smoking  status: Some Days    Types: Cigarettes   Smokeless tobacco: Never   Tobacco comments:    2 per day 01/30/21  Vaping Use   Vaping Use: Never used  Substance and Sexual Activity   Alcohol use: Yes    Alcohol/week: 0.0 standard drinks    Comment: rare maybe 1-2  year   Drug use: No   Sexual activity: Yes  Other Topics Concern   Not on file  Social History Narrative   She is on disability for chronic back pain. Pt's son lives with her   Social Determinants of Health   Financial Resource Strain: Medium Risk   Difficulty of Paying Living Expenses: Somewhat hard  Food Insecurity: No Food Insecurity   Worried About Charity fundraiser in the Last Year: Never true   Arboriculturist in the Last Year: Never true  Transportation Needs: No Transportation Needs   Lack of Transportation (Medical): No   Lack of Transportation (Non-Medical): No  Physical Activity: Inactive   Days of Exercise per Week: 0 days   Minutes of Exercise per Session: 0 min  Stress: Stress Concern Present   Feeling of Stress : To some extent  Social Connections: Moderately Isolated   Frequency of Communication with Friends and Family: More than three times a week  Frequency of Social Gatherings with Friends and Family: Three times a week   Attends Religious Services: More than 4 times per year   Active Member of Clubs or Organizations: No   Attends Archivist Meetings: Never   Marital Status: Never married  Human resources officer Violence: Not At Risk   Fear of Current or Ex-Partner: No   Emotionally Abused: No   Physically Abused: No   Sexually Abused: No    Family History  Problem Relation Age of Onset   Heart attack Mother    CAD Mother    Kidney disease Father      Current Outpatient Medications:    albuterol (VENTOLIN HFA) 108 (90 Base) MCG/ACT inhaler, INHALE 2 PUFFS BY MOUTH EVERY 6 HOURS AS NEEDED FOR WHEEZING AND FOR SHORTNESS OF BREATH, Disp: 18 g, Rfl: 1   aspirin 81 MG tablet, Take 1  tablet by mouth daily., Disp: , Rfl:    atorvastatin (LIPITOR) 40 MG tablet, Take 1 tablet (40 mg total) by mouth at bedtime., Disp: 90 tablet, Rfl: 1   blood glucose meter kit and supplies, Dispense based on patient and insurance preference. Use up to four times daily as directed. (FOR ICD-10 E10.9, E11.9)., Disp: 1 each, Rfl: 0   diphenhydramine-acetaminophen (TYLENOL PM) 25-500 MG TABS tablet, Take 1 tablet by mouth at bedtime as needed., Disp: , Rfl:    escitalopram (LEXAPRO) 20 MG tablet, Take 1 tablet (20 mg total) by mouth daily., Disp: 90 tablet, Rfl: 1   fluticasone (FLONASE) 50 MCG/ACT nasal spray, USE 2 SPRAY(S) IN EACH NOSTRIL AT BEDTIME (Patient taking differently: Place 2 sprays into both nostrils daily as needed. USE 2 SPRAY(S) IN EACH NOSTRIL AT BEDTIME), Disp: 16 g, Rfl: 0   Fluticasone-Umeclidin-Vilant (TRELEGY ELLIPTA) 100-62.5-25 MCG/INH AEPB, Take 1 puff by mouth daily., Disp: 180 each, Rfl: 1   gabapentin (NEURONTIN) 300 MG capsule, Take 2 capsules (600 mg total) by mouth 2 (two) times daily., Disp: 360 capsule, Rfl: 1   Insulin Pen Needle 31G X 8 MM MISC, 1 each by Does not apply route daily., Disp: 100 each, Rfl: 1   methylPREDNISolone (MEDROL DOSEPAK) 4 MG TBPK tablet, Day 1: Take 8 mg (2 tablets) before breakfast, 4 mg (1 tablet) after lunch, 4 mg (1 tablet) after supper, and 8 mg (2 tablets) at bedtime. Day 2:Take 4 mg (1 tablet) before breakfast, 4 mg (1 tablet) after lunch, 4 mg (1 tablet) after supper, and 8 mg (2 tablets) at bedtime. Day 3: Take 4 mg (1 tablet) before breakfast, 4 mg (1 tablet) after lunch, 4 mg (1 tablet) after supper, and 4 mg (1 tablet) at bedtime. Day 4: Take 4 mg (1 tablet) before breakfast, 4 mg (1 tablet) after lunch, and 4 mg (1 tablet) at bedtime. Day 5: Take 4 mg (1 tablet) before breakfast and 4 mg (1 tablet) at bedtime. Day 6: Take 4 mg (1 tablet) before breakfast., Disp: 1 each, Rfl: 0   sodium zirconium cyclosilicate (LOKELMA) 10 g PACK packet,  Take 10 g by mouth daily., Disp: 10 each, Rfl: 1   TRESIBA FLEXTOUCH 100 UNIT/ML FlexTouch Pen, INJECT 12-30 UNITS OF INSULIN INTO THE SKIN DAILY TO KEEP FASTING GLUCOSE BETWEEN 100-140, Disp: 15 mL, Rfl: 0   ondansetron (ZOFRAN ODT) 4 MG disintegrating tablet, Take 1 tablet (4 mg total) by mouth every 8 (eight) hours as needed for nausea or vomiting. (Patient not taking: Reported on 10/27/2021), Disp: 20 tablet, Rfl: 0   ondansetron (  ZOFRAN) 8 MG tablet, Take 1 tablet (8 mg total) by mouth 2 (two) times daily as needed. Start on the third day after cisplatin chemotherapy. (Patient not taking: Reported on 10/27/2021), Disp: 30 tablet, Rfl: 1   prochlorperazine (COMPAZINE) 10 MG tablet, Take 1 tablet (10 mg total) by mouth every 6 (six) hours as needed (Nausea or vomiting). (Patient not taking: Reported on 10/27/2021), Disp: 30 tablet, Rfl: 1  Physical exam:  Vitals:   12/12/21 0842  BP: (!) 142/75  Pulse: 81  Resp: 16  Temp: 98.2 F (36.8 C)  TempSrc: Tympanic  SpO2: 100%  Weight: 154 lb 9.6 oz (70.1 kg)   Physical Exam Constitutional:      General: She is not in acute distress. Cardiovascular:     Rate and Rhythm: Normal rate and regular rhythm.     Heart sounds: Normal heart sounds.  Pulmonary:     Effort: Pulmonary effort is normal.     Breath sounds: Normal breath sounds.  Abdominal:     General: Bowel sounds are normal.     Palpations: Abdomen is soft.  Musculoskeletal:     Cervical back: Normal range of motion.  Skin:    General: Skin is warm and dry.  Neurological:     Mental Status: She is alert and oriented to person, place, and time.     CMP Latest Ref Rng & Units 12/12/2021  Glucose 70 - 99 mg/dL -  BUN 8 - 23 mg/dL -  Creatinine 0.44 - 1.00 mg/dL -  Sodium 135 - 145 mmol/L -  Potassium 3.5 - 5.1 mmol/L -  Chloride 98 - 111 mmol/L -  CO2 22 - 32 mmol/L -  Calcium 8.9 - 10.3 mg/dL -  Total Protein 6.5 - 8.1 g/dL -  Total Bilirubin 0.3 - 1.2 mg/dL 1.2  Alkaline  Phos 38 - 126 U/L -  AST 15 - 41 U/L -  ALT 0 - 44 U/L -   CBC Latest Ref Rng & Units 12/12/2021  WBC 4.0 - 10.5 K/uL 24.8(H)  Hemoglobin 12.0 - 15.0 g/dL 8.7(L)  Hematocrit 36.0 - 46.0 % 27.6(L)  Platelets 150 - 400 K/uL 229    No images are attached to the encounter.  CT CHEST ABDOMEN PELVIS W CONTRAST  Result Date: 11/17/2021 CLINICAL DATA:  Abnormal liver enzymes. Intrahepatic cholangiocarcinoma. Restaging. EXAM: CT CHEST, ABDOMEN, AND PELVIS WITH CONTRAST TECHNIQUE: Multidetector CT imaging of the chest, abdomen and pelvis was performed following the standard protocol during bolus administration of intravenous contrast. CONTRAST:  154m OMNIPAQUE IOHEXOL 300 MG/ML  SOLN COMPARISON:  08/12/2021 FINDINGS: CT CHEST FINDINGS Cardiovascular: The heart size is normal. No substantial pericardial effusion. Coronary artery calcification is evident. Mild atherosclerotic calcification is noted in the wall of the thoracic aorta. Right Port-A-Cath tip is positioned below the right atrium in the IVC. Mediastinum/Nodes: No mediastinal lymphadenopathy. No evidence for gross hilar lymphadenopathy although assessment is limited by the lack of intravenous contrast on the current study. The esophagus has normal imaging features. There is no axillary lymphadenopathy. Lungs/Pleura: No suspicious pulmonary nodule or mass. No focal airspace consolidation. There is no evidence of pleural effusion. Musculoskeletal: No worrisome lytic or sclerotic osseous abnormality. CT ABDOMEN PELVIS FINDINGS Hepatobiliary: Similar appearance of volume loss in the right hepatic lobe. Right hepatic lobe lesion measured previously at 4.6 x 4.9 cm is 4.0 x 5.3 cm today when measured in a similar fashion. Similar appearance of satellite lesions although differential enhancement characteristics may reflect bolus timing.  Stable intrahepatic biliary duct dilatation adjacent to the mass. Layering tiny calcified gallstones evident. Common bile  duct in the head of the pancreas is more distended today measuring up to 6 mm diameter compared to 4 mm previously. Nodular hepatic contour is stable. Pancreas: No focal mass lesion. No dilatation of the main duct. No intraparenchymal cyst. No peripancreatic edema. Spleen: No splenomegaly. No focal mass lesion. Adrenals/Urinary Tract: Right adrenal gland unremarkable. Thickening of the left adrenal gland is stable. Right kidney unremarkable. Tiny cyst noted upper pole left kidney. No evidence for hydroureter. The urinary bladder appears normal for the degree of distention. Stomach/Bowel: Stomach is unremarkable. No gastric wall thickening. No evidence of outlet obstruction. Duodenum is normally positioned as is the ligament of Treitz. Duodenal diverticulum noted. No small bowel wall thickening. No small bowel dilatation. The terminal ileum is normal. The appendix is not well visualized, but there is no edema or inflammation in the region of the cecum. No gross colonic mass. No colonic wall thickening. Vascular/Lymphatic: There is mild atherosclerotic calcification of the abdominal aorta without aneurysm. Hepatoduodenal ligament lymphadenopathy is similar to prior. 15 mm short axis hepatoduodenal ligament lymph node measured previously is 14 mm today on image 63/2. Portal caval node measures 13 mm short axis today, similar to prior. Upper normal retroperitoneal lymph nodes again noted. No pelvic sidewall lymphadenopathy. Reproductive: There is no adnexal mass. Other: No intraperitoneal free fluid. Musculoskeletal: No worrisome lytic or sclerotic osseous abnormality. Small umbilical hernia contains only fat. IMPRESSION: 1. No substantial interval change in size of the right hepatic lobe mass with satellite lesions. Imaging features compatible with known cholangiocarcinoma. 2. Stable appearance of intrahepatic biliary duct dilatation in the right hepatic lobe adjacent to the mass. 3. Stable appearance of mild  lymphadenopathy in the hepatoduodenal ligament. 4. Cholelithiasis. 5. Aortic Atherosclerosis (ICD10-I70.0). Electronically Signed   By: Misty Stanley M.D.   On: 11/17/2021 16:01     Assessment and plan- Patient is a 61 y.o. female with stage IV intrahepatic cholangiocarcinoma.  She is here for on treatment assessment prior to cycle 8-day 1 of gemcitabine cisplatin chemotherapy   Counts are okay to proceed with cycle 8-day 1 of gemcitabine cisplatin chemotherapy today her white cell count is elevated at 24.8 likely secondary to Neulasta.  She will directly proceed for cycle 8-day 15 of gemcitabine cisplatin chemotherapy next week with possible growth factor support and I will see her back in 3 weeks for cycle 9-day 1.    She recently had CT scans in December 2022 when she was in the ERFor hyperkalemia and elevated bilirubin.  At that time CT scan showed overall stable size of right hepatic lobe lesion and no overt evidence of disease progression.   Visit Diagnosis 1. Intrahepatic cholangiocarcinoma ([place])   2. Encounter for antineoplastic chemotherapy      Dr. Randa Evens, MD, MPH [facility] at [facility] [phone] 12/12/2021 1:53 PM

## 2021-12-12 NOTE — Progress Notes (Signed)
Patient here for pre treatment check she has no complaints today or concerns.

## 2021-12-12 NOTE — Patient Instructions (Signed)
[facility] AT [place]  Discharge Instructions: Thank you for choosing [facility] to provide your oncology and hematology care.  If you have a lab appointment with the [facility], please go directly to the [place] and check in at the registration area.  Wear comfortable clothing and clothing appropriate for easy access to any Portacath or PICC line.   We strive to give you quality time with your provider. You may need to reschedule your appointment if you arrive late (15 or more minutes).  Arriving late affects you and other patients whose appointments are after yours.  Also, if you miss three or more appointments without notifying the office, you may be dismissed from the clinic at the providers discretion.      For prescription refill requests, have your pharmacy contact our office and allow 72 hours for refills to be completed.    Today you received the following chemotherapy and/or immunotherapy agents       To help prevent nausea and vomiting after your treatment, we encourage you to take your nausea medication as directed.  BELOW ARE SYMPTOMS THAT SHOULD BE REPORTED IMMEDIATELY: *FEVER GREATER THAN 100.4 F (38 C) OR HIGHER *CHILLS OR SWEATING *NAUSEA AND VOMITING THAT IS NOT CONTROLLED WITH YOUR NAUSEA MEDICATION *UNUSUAL SHORTNESS OF BREATH *UNUSUAL BRUISING OR BLEEDING *URINARY PROBLEMS (pain or burning when urinating, or frequent urination) *BOWEL PROBLEMS (unusual diarrhea, constipation, pain near the anus) TENDERNESS IN MOUTH AND THROAT WITH OR WITHOUT PRESENCE OF ULCERS (sore throat, sores in mouth, or a toothache) UNUSUAL RASH, SWELLING OR PAIN  UNUSUAL VAGINAL DISCHARGE OR ITCHING   Items with * indicate a potential emergency and should be followed up as soon as possible or go to the Emergency Department if any problems should occur.  Please show the CHEMOTHERAPY ALERT CARD or IMMUNOTHERAPY ALERT CARD at check-in to the  Emergency Department and triage nurse.  Should you have questions after your visit or need to cancel or reschedule your appointment, please contact [facility] [place] AT [place]  [phone] and follow the prompts.  Office hours are 8:00 a.m. to 4:30 p.m. Monday - Friday. Please note that voicemails left after 4:00 p.m. may not be returned until the following business day.  We are closed weekends and major holidays. You have access to a nurse at all times for urgent questions. Please call the main number to the clinic [phone] and follow the prompts.  For any non-urgent questions, you may also contact your provider using MyChart. We now offer e-Visits for anyone 17 and older to request care online for non-urgent symptoms. For details visit [email or web address].   Also download the MyChart app! Go to the app store, search "MyChart", open the app, select [place], and log in with your MyChart username and password.  Due to Covid, a mask is required upon entering the hospital/clinic. If you do not have a mask, one will be given to you upon arrival. For doctor visits, patients may have 1 support person aged 36 or older with them. For treatment visits, patients cannot have anyone with them due to current Covid guidelines and our immunocompromised population.

## 2021-12-16 ENCOUNTER — Other Ambulatory Visit: Payer: Self-pay | Admitting: Family Medicine

## 2021-12-16 DIAGNOSIS — G8929 Other chronic pain: Secondary | ICD-10-CM

## 2021-12-16 DIAGNOSIS — M5440 Lumbago with sciatica, unspecified side: Secondary | ICD-10-CM

## 2021-12-16 DIAGNOSIS — E1142 Type 2 diabetes mellitus with diabetic polyneuropathy: Secondary | ICD-10-CM

## 2021-12-16 DIAGNOSIS — M4807 Spinal stenosis, lumbosacral region: Secondary | ICD-10-CM

## 2021-12-19 ENCOUNTER — Inpatient Hospital Stay: Payer: Medicare HMO

## 2021-12-19 ENCOUNTER — Other Ambulatory Visit: Payer: Self-pay

## 2021-12-19 ENCOUNTER — Other Ambulatory Visit: Payer: Self-pay | Admitting: Oncology

## 2021-12-19 VITALS — BP 139/70 | HR 80 | Temp 96.8°F | Resp 18 | Ht 64.0 in | Wt 158.1 lb

## 2021-12-19 DIAGNOSIS — I10 Essential (primary) hypertension: Secondary | ICD-10-CM | POA: Diagnosis not present

## 2021-12-19 DIAGNOSIS — E119 Type 2 diabetes mellitus without complications: Secondary | ICD-10-CM | POA: Diagnosis not present

## 2021-12-19 DIAGNOSIS — Z90710 Acquired absence of both cervix and uterus: Secondary | ICD-10-CM | POA: Diagnosis not present

## 2021-12-19 DIAGNOSIS — D6481 Anemia due to antineoplastic chemotherapy: Secondary | ICD-10-CM | POA: Diagnosis not present

## 2021-12-19 DIAGNOSIS — Z5111 Encounter for antineoplastic chemotherapy: Secondary | ICD-10-CM | POA: Diagnosis not present

## 2021-12-19 DIAGNOSIS — Z79899 Other long term (current) drug therapy: Secondary | ICD-10-CM | POA: Diagnosis not present

## 2021-12-19 DIAGNOSIS — Z95828 Presence of other vascular implants and grafts: Secondary | ICD-10-CM

## 2021-12-19 DIAGNOSIS — C221 Intrahepatic bile duct carcinoma: Secondary | ICD-10-CM

## 2021-12-19 DIAGNOSIS — D509 Iron deficiency anemia, unspecified: Secondary | ICD-10-CM | POA: Diagnosis not present

## 2021-12-19 DIAGNOSIS — E538 Deficiency of other specified B group vitamins: Secondary | ICD-10-CM | POA: Diagnosis not present

## 2021-12-19 LAB — COMPREHENSIVE METABOLIC PANEL
ALT: 43 U/L (ref 0–44)
AST: 61 U/L — ABNORMAL HIGH (ref 15–41)
Albumin: 2.6 g/dL — ABNORMAL LOW (ref 3.5–5.0)
Alkaline Phosphatase: 494 U/L — ABNORMAL HIGH (ref 38–126)
Anion gap: 4 — ABNORMAL LOW (ref 5–15)
BUN: 22 mg/dL (ref 8–23)
CO2: 21 mmol/L — ABNORMAL LOW (ref 22–32)
Calcium: 7.9 mg/dL — ABNORMAL LOW (ref 8.9–10.3)
Chloride: 111 mmol/L (ref 98–111)
Creatinine, Ser: 1.05 mg/dL — ABNORMAL HIGH (ref 0.44–1.00)
GFR, Estimated: >60 mL/min (ref >60)
Glucose, Bld: 121 mg/dL — ABNORMAL HIGH (ref 70–99)
Potassium: 4.4 mmol/L (ref 3.5–5.1)
Sodium: 136 mmol/L (ref 135–145)
Total Bilirubin: 1.2 mg/dL (ref 0.3–1.2)
Total Protein: 7.8 g/dL (ref 6.5–8.1)

## 2021-12-19 LAB — CBC WITH DIFFERENTIAL/PLATELET
Abs Immature Granulocytes: 0.04 10*3/uL (ref 0.00–0.07)
Basophils Absolute: 0 10*3/uL (ref 0.0–0.1)
Basophils Relative: 1 %
Eosinophils Absolute: 0.1 10*3/uL (ref 0.0–0.5)
Eosinophils Relative: 1 %
HCT: 24.3 % — ABNORMAL LOW (ref 36.0–46.0)
Hemoglobin: 7.8 g/dL — ABNORMAL LOW (ref 12.0–15.0)
Immature Granulocytes: 1 %
Lymphocytes Relative: 27 %
Lymphs Abs: 1.8 10*3/uL (ref 0.7–4.0)
MCH: 32.1 pg (ref 26.0–34.0)
MCHC: 32.1 g/dL (ref 30.0–36.0)
MCV: 100 fL (ref 80.0–100.0)
Monocytes Absolute: 0.5 10*3/uL (ref 0.1–1.0)
Monocytes Relative: 7 %
Neutro Abs: 4.4 10*3/uL (ref 1.7–7.7)
Neutrophils Relative %: 63 %
Platelets: 181 10*3/uL (ref 150–400)
RBC: 2.43 MIL/uL — ABNORMAL LOW (ref 3.87–5.11)
RDW: 24.1 % — ABNORMAL HIGH (ref 11.5–15.5)
WBC: 6.4 10*3/uL (ref 4.0–10.5)
nRBC: 0 % (ref 0.0–0.2)

## 2021-12-19 LAB — MAGNESIUM: Magnesium: 2.3 mg/dL (ref 1.7–2.4)

## 2021-12-19 MED ORDER — SODIUM CHLORIDE 0.9 % IV SOLN
1400.0000 mg | Freq: Once | INTRAVENOUS | Status: AC
  Administered 2021-12-19: 1400 mg via INTRAVENOUS
  Filled 2021-12-19: qty 26.3

## 2021-12-19 MED ORDER — SODIUM CHLORIDE 0.9 % IV SOLN
10.0000 mg | Freq: Once | INTRAVENOUS | Status: AC
  Administered 2021-12-19: 10 mg via INTRAVENOUS
  Filled 2021-12-19: qty 10

## 2021-12-19 MED ORDER — PALONOSETRON HCL INJECTION 0.25 MG/5ML
0.2500 mg | Freq: Once | INTRAVENOUS | Status: AC
  Administered 2021-12-19: 0.25 mg via INTRAVENOUS

## 2021-12-19 MED ORDER — SODIUM CHLORIDE 0.9 % IV SOLN
Freq: Once | INTRAVENOUS | Status: AC
  Filled 2021-12-19: qty 250

## 2021-12-19 MED ORDER — SODIUM CHLORIDE 0.9% FLUSH
10.0000 mL | Freq: Once | INTRAVENOUS | Status: AC
  Administered 2021-12-19: 10 mL via INTRAVENOUS
  Filled 2021-12-19: qty 10

## 2021-12-19 MED ORDER — SODIUM CHLORIDE 0.9 % IV SOLN
127.2000 mg | Freq: Once | INTRAVENOUS | Status: AC
  Administered 2021-12-19: 130 mg via INTRAVENOUS
  Filled 2021-12-19: qty 13

## 2021-12-19 MED ORDER — HEPARIN SOD (PORK) LOCK FLUSH 100 UNIT/ML IV SOLN
500.0000 [IU] | Freq: Once | INTRAVENOUS | Status: AC | PRN
  Administered 2021-12-19: 500 [IU]
  Filled 2021-12-19: qty 5

## 2021-12-19 NOTE — Patient Instructions (Signed)
[facility]  Discharge Instructions: Thank you for choosing [facility] to provide your oncology and hematology care.  If you have a lab appointment with the [facility], please go directly to the [facility] and check in at the registration area.  Wear comfortable clothing and clothing appropriate for easy access to any Portacath or PICC line.   We strive to give you quality time with your provider. You may need to reschedule your appointment if you arrive late (15 or more minutes).  Arriving late affects you and other patients whose appointments are after yours.  Also, if you miss three or more appointments without notifying the office, you may be dismissed from the clinic at the providers discretion.      For prescription refill requests, have your pharmacy contact our office and allow 72 hours for refills to be completed.    Today you received the following chemotherapy and/or immunotherapy agents CARBOPLATIN and GEMZAR      To help prevent nausea and vomiting after your treatment, we encourage you to take your nausea medication as directed.  BELOW ARE SYMPTOMS THAT SHOULD BE REPORTED IMMEDIATELY: *FEVER GREATER THAN 100.4 F (38 C) OR HIGHER *CHILLS OR SWEATING *NAUSEA AND VOMITING THAT IS NOT CONTROLLED WITH YOUR NAUSEA MEDICATION *UNUSUAL SHORTNESS OF BREATH *UNUSUAL BRUISING OR BLEEDING *URINARY PROBLEMS (pain or burning when urinating, or frequent urination) *BOWEL PROBLEMS (unusual diarrhea, constipation, pain near the anus) TENDERNESS IN MOUTH AND THROAT WITH OR WITHOUT PRESENCE OF ULCERS (sore throat, sores in mouth, or a toothache) UNUSUAL RASH, SWELLING OR PAIN  UNUSUAL VAGINAL DISCHARGE OR ITCHING   Items with * indicate a potential emergency and should be followed up as soon as possible or go to the Emergency Department if any problems should occur.  Please show the CHEMOTHERAPY ALERT CARD or IMMUNOTHERAPY ALERT CARD at  check-in to the Emergency Department and triage nurse.  Should you have questions after your visit or need to cancel or reschedule your appointment, please contact [facility] [place] [facility] [place]  [phone] and follow the prompts.  Office hours are 8:00 a.m. to 4:30 p.m. Monday - Friday. Please note that voicemails left after 4:00 p.m. may not be returned until the following business day.  We are closed weekends and major holidays. You have access to a nurse at all times for urgent questions. Please call the main number to the clinic [phone] and follow the prompts.  For any non-urgent questions, you may also contact your provider using MyChart. We now offer e-Visits for anyone 37 and older to request care online for non-urgent symptoms. For details visit [email or web address].   Also download the MyChart app! Go to the app store, search "MyChart", open the app, select [facility], and log in with your MyChart username and password.  Due to Covid, a mask is required upon entering the hospital/clinic. If you do not have a mask, one will be given to you upon arrival. For doctor visits, patients may have 1 support person aged 21 or older with them. For treatment visits, patients cannot have anyone with them due to current Covid guidelines and our immunocompromised population.    Carboplatin injection What is this medication? CARBOPLATIN (KAR boe pla tin) is a chemotherapy drug. It targets fast dividing cells, like cancer cells, and causes these cells to die. This medicine is used to treat ovarian cancer and many other cancers. This medicine may be used for other purposes; ask your health  care provider or pharmacist if you have questions. COMMON BRAND NAME(S): Paraplatin What should I tell my care team before I take this medication? They need to know if you have any of these conditions: blood disorders hearing problems kidney disease recent or ongoing radiation therapy an  unusual or allergic reaction to carboplatin, cisplatin, other chemotherapy, other medicines, foods, dyes, or preservatives pregnant or trying to get pregnant breast-feeding How should I use this medication? This drug is usually given as an infusion into a vein. It is administered in a hospital or clinic by a specially trained health care professional. Talk to your pediatrician regarding the use of this medicine in children. Special care may be needed. Overdosage: If you think you have taken too much of this medicine contact a poison control center or emergency room at once. NOTE: This medicine is only for you. Do not share this medicine with others. What if I miss a dose? It is important not to miss a dose. Call your doctor or health care professional if you are unable to keep an appointment. What may interact with this medication? medicines for seizures medicines to increase blood counts like filgrastim, pegfilgrastim, sargramostim some antibiotics like amikacin, gentamicin, neomycin, streptomycin, tobramycin vaccines Talk to your doctor or health care professional before taking any of these medicines: acetaminophen aspirin ibuprofen ketoprofen naproxen This list may not describe all possible interactions. Give your health care provider a list of all the medicines, herbs, non-prescription drugs, or dietary supplements you use. Also tell them if you smoke, drink alcohol, or use illegal drugs. Some items may interact with your medicine. What should I watch for while using this medication? Your condition will be monitored carefully while you are receiving this medicine. You will need important blood work done while you are taking this medicine. This drug may make you feel generally unwell. This is not uncommon, as chemotherapy can affect healthy cells as well as cancer cells. Report any side effects. Continue your course of treatment even though you feel ill unless your doctor tells you to  stop. In some cases, you may be given additional medicines to help with side effects. Follow all directions for their use. Call your doctor or health care professional for advice if you get a fever, chills or sore throat, or other symptoms of a cold or flu. Do not treat yourself. This drug decreases your body's ability to fight infections. Try to avoid being around people who are sick. This medicine may increase your risk to bruise or bleed. Call your doctor or health care professional if you notice any unusual bleeding. Be careful brushing and flossing your teeth or using a toothpick because you may get an infection or bleed more easily. If you have any dental work done, tell your dentist you are receiving this medicine. Avoid taking products that contain aspirin, acetaminophen, ibuprofen, naproxen, or ketoprofen unless instructed by your doctor. These medicines may hide a fever. Do not become pregnant while taking this medicine. Women should inform their doctor if they wish to become pregnant or think they might be pregnant. There is a potential for serious side effects to an unborn child. Talk to your health care professional or pharmacist for more information. Do not breast-feed an infant while taking this medicine. What side effects may I notice from receiving this medication? Side effects that you should report to your doctor or health care professional as soon as possible: allergic reactions like skin rash, itching or hives, swelling of the face,  lips, or tongue signs of infection - fever or chills, cough, sore throat, pain or difficulty passing urine signs of decreased platelets or bleeding - bruising, pinpoint red spots on the skin, black, tarry stools, nosebleeds signs of decreased red blood cells - unusually weak or tired, fainting spells, lightheadedness breathing problems changes in hearing changes in vision chest pain high blood pressure low blood counts - This drug may decrease the  number of white blood cells, red blood cells and platelets. You may be at increased risk for infections and bleeding. nausea and vomiting pain, swelling, redness or irritation at the injection site pain, tingling, numbness in the hands or feet problems with balance, talking, walking trouble passing urine or change in the amount of urine Side effects that usually do not require medical attention (report to your doctor or health care professional if they continue or are bothersome): hair loss loss of appetite metallic taste in the mouth or changes in taste This list may not describe all possible side effects. Call your doctor for medical advice about side effects. You may report side effects to FDA at 1-800-FDA-1088. Where should I keep my medication? This drug is given in a hospital or clinic and will not be stored at home. NOTE: This sheet is a summary. It may not cover all possible information. If you have questions about this medicine, talk to your doctor, pharmacist, or health care provider.  2022 Elsevier/Gold Standard (2008-05-02 00:00:00)   Gemcitabine injection What is this medication? GEMCITABINE (jem SYE ta been) is a chemotherapy drug. This medicine is used to treat many types of cancer like breast cancer, lung cancer, pancreatic cancer, and ovarian cancer. This medicine may be used for other purposes; ask your health care provider or pharmacist if you have questions. COMMON BRAND NAME(S): Gemzar, Infugem What should I tell my care team before I take this medication? They need to know if you have any of these conditions: blood disorders infection kidney disease liver disease lung or breathing disease, like asthma recent or ongoing radiation therapy an unusual or allergic reaction to gemcitabine, other chemotherapy, other medicines, foods, dyes, or preservatives pregnant or trying to get pregnant breast-feeding How should I use this medication? This drug is given as an  infusion into a vein. It is administered in a hospital or clinic by a specially trained health care professional. Talk to your pediatrician regarding the use of this medicine in children. Special care may be needed. Overdosage: If you think you have taken too much of this medicine contact a poison control center or emergency room at once. NOTE: This medicine is only for you. Do not share this medicine with others. What if I miss a dose? It is important not to miss your dose. Call your doctor or health care professional if you are unable to keep an appointment. What may interact with this medication? medicines to increase blood counts like filgrastim, pegfilgrastim, sargramostim some other chemotherapy drugs like cisplatin vaccines Talk to your doctor or health care professional before taking any of these medicines: acetaminophen aspirin ibuprofen ketoprofen naproxen This list may not describe all possible interactions. Give your health care provider a list of all the medicines, herbs, non-prescription drugs, or dietary supplements you use. Also tell them if you smoke, drink alcohol, or use illegal drugs. Some items may interact with your medicine. What should I watch for while using this medication? Visit your doctor for checks on your progress. This drug may make you feel generally unwell. This  is not uncommon, as chemotherapy can affect healthy cells as well as cancer cells. Report any side effects. Continue your course of treatment even though you feel ill unless your doctor tells you to stop. In some cases, you may be given additional medicines to help with side effects. Follow all directions for their use. Call your doctor or health care professional for advice if you get a fever, chills or sore throat, or other symptoms of a cold or flu. Do not treat yourself. This drug decreases your body's ability to fight infections. Try to avoid being around people who are sick. This medicine may  increase your risk to bruise or bleed. Call your doctor or health care professional if you notice any unusual bleeding. Be careful brushing and flossing your teeth or using a toothpick because you may get an infection or bleed more easily. If you have any dental work done, tell your dentist you are receiving this medicine. Avoid taking products that contain aspirin, acetaminophen, ibuprofen, naproxen, or ketoprofen unless instructed by your doctor. These medicines may hide a fever. Do not become pregnant while taking this medicine or for 6 months after stopping it. Women should inform their doctor if they wish to become pregnant or think they might be pregnant. Men should not father a child while taking this medicine and for 3 months after stopping it. There is a potential for serious side effects to an unborn child. Talk to your health care professional or pharmacist for more information. Do not breast-feed an infant while taking this medicine or for at least 1 week after stopping it. Men should inform their doctors if they wish to father a child. This medicine may lower sperm counts. Talk with your doctor or health care professional if you are concerned about your fertility. What side effects may I notice from receiving this medication? Side effects that you should report to your doctor or health care professional as soon as possible: allergic reactions like skin rash, itching or hives, swelling of the face, lips, or tongue breathing problems pain, redness, or irritation at site where injected signs and symptoms of a dangerous change in heartbeat or heart rhythm like chest pain; dizziness; fast or irregular heartbeat; palpitations; feeling faint or lightheaded, falls; breathing problems signs of decreased platelets or bleeding - bruising, pinpoint red spots on the skin, black, tarry stools, blood in the urine signs of decreased red blood cells - unusually weak or tired, feeling faint or lightheaded,  falls signs of infection - fever or chills, cough, sore throat, pain or difficulty passing urine signs and symptoms of kidney injury like trouble passing urine or change in the amount of urine signs and symptoms of liver injury like dark yellow or brown urine; general ill feeling or flu-like symptoms; light-colored stools; loss of appetite; nausea; right upper belly pain; unusually weak or tired; yellowing of the eyes or skin swelling of ankles, feet, hands Side effects that usually do not require medical attention (report to your doctor or health care professional if they continue or are bothersome): constipation diarrhea hair loss loss of appetite nausea rash vomiting This list may not describe all possible side effects. Call your doctor for medical advice about side effects. You may report side effects to FDA at 1-800-FDA-1088. Where should I keep my medication? This drug is given in a hospital or clinic and will not be stored at home. NOTE: This sheet is a summary. It may not cover all possible information. If you have questions  about this medicine, talk to your doctor, pharmacist, or health care provider.  2022 Elsevier/Gold Standard (2018-02-16 00:00:00)

## 2021-12-19 NOTE — Progress Notes (Signed)
Dr Janese Banks aware of HGB 7.8.  Treatment plan adjusted. OK to infuse.

## 2021-12-24 ENCOUNTER — Other Ambulatory Visit: Payer: Self-pay

## 2021-12-24 ENCOUNTER — Ambulatory Visit: Payer: Medicare HMO | Admitting: Family Medicine

## 2021-12-24 DIAGNOSIS — J449 Chronic obstructive pulmonary disease, unspecified: Secondary | ICD-10-CM

## 2021-12-24 DIAGNOSIS — E118 Type 2 diabetes mellitus with unspecified complications: Secondary | ICD-10-CM

## 2021-12-24 DIAGNOSIS — E1169 Type 2 diabetes mellitus with other specified complication: Secondary | ICD-10-CM

## 2021-12-24 DIAGNOSIS — E785 Hyperlipidemia, unspecified: Secondary | ICD-10-CM

## 2021-12-24 DIAGNOSIS — F331 Major depressive disorder, recurrent, moderate: Secondary | ICD-10-CM

## 2021-12-26 ENCOUNTER — Ambulatory Visit (INDEPENDENT_AMBULATORY_CARE_PROVIDER_SITE_OTHER): Payer: Medicare HMO | Admitting: Family Medicine

## 2021-12-26 ENCOUNTER — Encounter: Payer: Self-pay | Admitting: Family Medicine

## 2021-12-26 VITALS — BP 128/64 | HR 94 | Resp 16 | Ht 64.0 in | Wt 158.0 lb

## 2021-12-26 DIAGNOSIS — E785 Hyperlipidemia, unspecified: Secondary | ICD-10-CM

## 2021-12-26 DIAGNOSIS — E440 Moderate protein-calorie malnutrition: Secondary | ICD-10-CM | POA: Diagnosis not present

## 2021-12-26 DIAGNOSIS — C221 Intrahepatic bile duct carcinoma: Secondary | ICD-10-CM

## 2021-12-26 DIAGNOSIS — R600 Localized edema: Secondary | ICD-10-CM

## 2021-12-26 DIAGNOSIS — R809 Proteinuria, unspecified: Secondary | ICD-10-CM

## 2021-12-26 DIAGNOSIS — I152 Hypertension secondary to endocrine disorders: Secondary | ICD-10-CM

## 2021-12-26 DIAGNOSIS — I10 Essential (primary) hypertension: Secondary | ICD-10-CM

## 2021-12-26 DIAGNOSIS — E1129 Type 2 diabetes mellitus with other diabetic kidney complication: Secondary | ICD-10-CM

## 2021-12-26 DIAGNOSIS — E118 Type 2 diabetes mellitus with unspecified complications: Secondary | ICD-10-CM

## 2021-12-26 DIAGNOSIS — D638 Anemia in other chronic diseases classified elsewhere: Secondary | ICD-10-CM

## 2021-12-26 DIAGNOSIS — J449 Chronic obstructive pulmonary disease, unspecified: Secondary | ICD-10-CM

## 2021-12-26 DIAGNOSIS — Z794 Long term (current) use of insulin: Secondary | ICD-10-CM

## 2021-12-26 DIAGNOSIS — E559 Vitamin D deficiency, unspecified: Secondary | ICD-10-CM

## 2021-12-26 DIAGNOSIS — J4489 Other specified chronic obstructive pulmonary disease: Secondary | ICD-10-CM

## 2021-12-26 DIAGNOSIS — E1159 Type 2 diabetes mellitus with other circulatory complications: Secondary | ICD-10-CM | POA: Diagnosis not present

## 2021-12-26 DIAGNOSIS — E1169 Type 2 diabetes mellitus with other specified complication: Secondary | ICD-10-CM | POA: Diagnosis not present

## 2021-12-26 DIAGNOSIS — F325 Major depressive disorder, single episode, in full remission: Secondary | ICD-10-CM

## 2021-12-26 DIAGNOSIS — R69 Illness, unspecified: Secondary | ICD-10-CM | POA: Diagnosis not present

## 2021-12-26 LAB — POCT GLYCOSYLATED HEMOGLOBIN (HGB A1C): Hemoglobin A1C: 5.4 % (ref 4.0–5.6)

## 2021-12-26 NOTE — Progress Notes (Signed)
Name: Janice Short   MRN: [id number]    DOB: 07/31/1961   Date:12/26/2021       Progress Note  Subjective  Chief Complaint  Follow Up  HPI  DMII: she is off Xultophy and Synjardi, only on Tresiba 38 units, today A1C is 5.4 %, advised to go down to 30 units daily  She continues to lose weight, 6 lbs in the past 6 months. She has associated microalbuminuria, HTN, dyslipidemia. She is not sure if she is taking Atorvastatin, Losartan, denies side effects of medications. Denies polyphagia, polydipsia or polyuria.   Adenocarcinoma poorly differentiated ( cholangiocarcinoma) with mets :  she had lost 44 lbs from 2021 to 2022  she was not trying when she was seen in our office back in January 22 we had discussed colonoscopy , lung CT and look for cancer but she chose a fit test and asked to hold off on further testing. She went to [facility] on 03/2021 with nausea/abdominal cramping  and was diagnosed with intrahepatic cholangiocarcinoma. She is seeing Dr. Janese Banks ( oncologist ) , she had a PET scan done 04/16/2021, she had a visit with surgeon and was not advisable to have surgery, getting chemotherapy, able to tolerate food , eating 3 meals per day , weight is slightly low, no longer having nausea and is compliant with medications    MDD: she has a long history of depression, periods of crying spell, lack of motivation, she was very stressed when first diagnosed with cancer, but is coping well now , taking medications and no side effects   Lower extremity edema: both lower legs, feels tight, improves when she elevates, going on for months and previous doppler negative for DVT it may be secondary to anemia, last hemoglobin below 8 or malnutrition    Asthma/copd syndrome: still smokes cigarettes intermittently, used to be a heavy smoker, but down to less than 0.25 pack daily, she states cough has improved very seldom has wheezing,  she denies SOB with activity . She states she has been using Trelegy daily now     JME:QASTMH medication, no chest pain or palpitation, bp is at goal today      Patient Active Problem List   Diagnosis Date Noted   Rectal polyp    Polyp of sigmoid colon    Abnormal findings on diagnostic imaging of digestive system    Gastritis with hemorrhage    Polyp of duodenum    Intrahepatic cholangiocarcinoma ([place]) 04/12/2021   Goals of care, counseling/discussion 04/12/2021   Mild episode of recurrent major depressive disorder ([place]) 02/08/2019   Dyslipidemia associated with type 2 diabetes mellitus ([place]) 12/17/2017   Noncompliance with medication regimen 12/17/2017   Spinal stenosis 10/15/2016   Diabetic polyneuropathy associated with type 2 diabetes mellitus ([place]) 07/14/2016   Chronic midline low back pain with sciatica 06/22/2016   Primary osteoarthritis of both knees 06/22/2016   Carpal tunnel syndrome 07/01/2015   Cervical radiculitis 07/01/2015   Corn or callus 07/01/2015   Impingement syndrome of shoulder 07/01/2015   Microalbuminuria 07/01/2015   Compulsive tobacco user syndrome 07/01/2015   Tenosynovitis of wrist 07/01/2015   Benign hypertension 11/05/2009   Athlete's foot 08/05/2009   Vitamin D deficiency 01/08/2009   Asthma, mild intermittent 12/28/2008   Allergic rhinitis 12/28/2008   Lumbosacral neuritis 08/11/2007    Past Surgical History:  Procedure Laterality Date   ABDOMINAL HYSTERECTOMY     COLONOSCOPY WITH PROPOFOL N/A 05/06/2021   Procedure: COLONOSCOPY WITH PROPOFOL;  Surgeon:  Lucilla Lame, MD;  Location: [place] [facility];  Service: Endoscopy;  Laterality: N/A;   ESOPHAGOGASTRODUODENOSCOPY (EGD) WITH PROPOFOL N/A 05/06/2021   Procedure: ESOPHAGOGASTRODUODENOSCOPY (EGD) WITH PROPOFOL;  Surgeon: Lucilla Lame, MD;  Location: [facility] ENDOSCOPY;  Service: Endoscopy;  Laterality: N/A;   PORTA CATH INSERTION N/A 07/17/2021   Procedure: PORTA CATH INSERTION;  Surgeon: Algernon Huxley, MD;  Location: [facility];  Service: Cardiovascular;  Laterality:  N/A;    Family History  Problem Relation Age of Onset   Heart attack Mother    CAD Mother    Kidney disease Father     Social History   Tobacco Use   Smoking status: Some Days    Types: Cigarettes   Smokeless tobacco: Never   Tobacco comments:    2 per day 01/30/21  Substance Use Topics   Alcohol use: Yes    Alcohol/week: 0.0 standard drinks    Comment: rare maybe 1-2  year     Current Outpatient Medications:    albuterol (VENTOLIN HFA) 108 (90 Base) MCG/ACT inhaler, INHALE 2 PUFFS BY MOUTH EVERY 6 HOURS AS NEEDED FOR WHEEZING AND FOR SHORTNESS OF BREATH, Disp: 18 g, Rfl: 1   aspirin 81 MG tablet, Take 1 tablet by mouth daily., Disp: , Rfl:    atorvastatin (LIPITOR) 40 MG tablet, Take 1 tablet (40 mg total) by mouth at bedtime., Disp: 90 tablet, Rfl: 1   blood glucose meter kit and supplies, Dispense based on patient and insurance preference. Use up to four times daily as directed. (FOR ICD-10 E10.9, E11.9)., Disp: 1 each, Rfl: 0   diphenhydramine-acetaminophen (TYLENOL PM) 25-500 MG TABS tablet, Take 1 tablet by mouth at bedtime as needed., Disp: , Rfl:    escitalopram (LEXAPRO) 20 MG tablet, Take 1 tablet (20 mg total) by mouth daily., Disp: 90 tablet, Rfl: 1   fluticasone (FLONASE) 50 MCG/ACT nasal spray, USE 2 SPRAY(S) IN EACH NOSTRIL AT BEDTIME (Patient taking differently: Place 2 sprays into both nostrils daily as needed. USE 2 SPRAY(S) IN EACH NOSTRIL AT BEDTIME), Disp: 16 g, Rfl: 0   Fluticasone-Umeclidin-Vilant (TRELEGY ELLIPTA) 100-62.5-25 MCG/INH AEPB, Take 1 puff by mouth daily., Disp: 180 each, Rfl: 1   gabapentin (NEURONTIN) 300 MG capsule, Take 2 capsules by mouth twice daily, Disp: 360 capsule, Rfl: 0   Insulin Pen Needle 31G X 8 MM MISC, 1 each by Does not apply route daily., Disp: 100 each, Rfl: 1   TRESIBA FLEXTOUCH 100 UNIT/ML FlexTouch Pen, INJECT 12-30 UNITS OF INSULIN INTO THE SKIN DAILY TO KEEP FASTING GLUCOSE BETWEEN 100-140, Disp: 15 mL, Rfl:  0  Allergies  Allergen Reactions   Other Shortness Of Breath    Cat dander and grass pollen   Cisplatin Itching   Bydureon [Exenatide] Other (See Comments)    Pain with injection not allergy    I personally reviewed active problem list, medication list, allergies, family history, social history, health maintenance with the patient/caregiver today.   ROS  Constitutional: Negative for fever , positive for weight change.  Respiratory: Negative for cough and shortness of breath.   Cardiovascular: Negative for chest pain or palpitations.  Gastrointestinal: Negative for abdominal pain, no bowel changes.  Musculoskeletal: Negative for gait problem or joint swelling.  Skin: Negative for rash.  Neurological: Negative for dizziness or headache.  No other specific complaints in a complete review of systems (except as listed in HPI above).   Objective  Vitals:   12/26/21 1023  BP: 128/64  Pulse:  94  Resp: 16  SpO2: 99%  Weight: 158 lb (71.7 kg)  Height: 5' 4" (1.626 m)    Body mass index is 27.12 kg/m.  Physical Exam  Constitutional: Patient appears well-developed and malnourished with temporal waisting. No distress.  HEENT: head atraumatic, normocephalic, pupils equal and reactive to light, neck supple, Cardiovascular: Normal rate, regular rhythm and normal heart sounds.  No murmur heard. No BLE edema. Pulmonary/Chest: Effort normal and breath sounds normal. No respiratory distress. Abdominal: Soft.  There is no tenderness. Psychiatric: Patient has a normal mood and affect. behavior is normal. Judgment and thought content normal.   Recent Results (from the past 2160 hour(s))  Magnesium     Status: None   Collection Time: 10/06/21  8:16 AM  Result Value Ref Range   Magnesium 1.9 1.7 - 2.4 mg/dL    Comment: Performed at [facility], [address] [place], [place] [address]  Comprehensive metabolic panel     Status: Abnormal   Collection Time: 10/06/21  8:16 AM   Result Value Ref Range   Sodium 137 135 - 145 mmol/L   Potassium 4.2 3.5 - 5.1 mmol/L   Chloride 106 98 - 111 mmol/L   CO2 24 22 - 32 mmol/L   Glucose, Bld 161 (H) 70 - 99 mg/dL    Comment: Glucose reference range applies only to samples taken after fasting for at least 8 hours.   BUN 13 8 - 23 mg/dL   Creatinine, Ser 1.04 (H) 0.44 - 1.00 mg/dL   Calcium 8.3 (L) 8.9 - 10.3 mg/dL   Total Protein 7.8 6.5 - 8.1 g/dL   Albumin 2.8 (L) 3.5 - 5.0 g/dL   AST 34 15 - 41 U/L   ALT 27 0 - 44 U/L   Alkaline Phosphatase 432 (H) 38 - 126 U/L   Total Bilirubin 0.7 0.3 - 1.2 mg/dL   GFR, Estimated >60 >60 mL/min    Comment: (NOTE) Calculated using the CKD-EPI Creatinine Equation (2021)    Anion gap 7 5 - 15    Comment: Performed at [facility], [address] [place], [place] [address]  CBC with Differential     Status: Abnormal   Collection Time: 10/06/21  8:16 AM  Result Value Ref Range   WBC 16.5 (H) 4.0 - 10.5 K/uL   RBC 3.21 (L) 3.87 - 5.11 MIL/uL   Hemoglobin 9.7 (L) 12.0 - 15.0 g/dL   HCT 30.6 (L) 36.0 - 46.0 %   MCV 95.3 80.0 - 100.0 fL   MCH 30.2 26.0 - 34.0 pg   MCHC 31.7 30.0 - 36.0 g/dL   RDW 18.7 (H) 11.5 - 15.5 %   Platelets 231 150 - 400 K/uL   nRBC 0.0 0.0 - 0.2 %   Neutrophils Relative % 75 %   Neutro Abs 12.3 (H) 1.7 - 7.7 K/uL   Lymphocytes Relative 16 %   Lymphs Abs 2.6 0.7 - 4.0 K/uL   Monocytes Relative 6 %   Monocytes Absolute 1.0 0.1 - 1.0 K/uL   Eosinophils Relative 2 %   Eosinophils Absolute 0.3 0.0 - 0.5 K/uL   Basophils Relative 0 %   Basophils Absolute 0.1 0.0 - 0.1 K/uL   Immature Granulocytes 1 %   Abs Immature Granulocytes 0.14 (H) 0.00 - 0.07 K/uL    Comment: Performed at [facility], [address] [place], [place] [address]  Magnesium     Status: None   Collection Time: 10/27/21  8:31 AM  Result Value Ref Range   Magnesium 2.3 1.7 - 2.4 mg/dL    Comment: Performed at [facility], [address] [place], [place]  [address]  Comprehensive metabolic panel     Status: Abnormal   Collection Time: 10/27/21  8:31 AM  Result Value Ref Range   Sodium 137 135 - 145 mmol/L   Potassium 5.8 (H) 3.5 - 5.1 mmol/L   Chloride 111 98 - 111 mmol/L   CO2 22 22 - 32 mmol/L   Glucose, Bld 180 (H) 70 - 99 mg/dL    Comment: Glucose reference range applies only to samples taken after fasting for at least 8 hours.   BUN 19 8 - 23 mg/dL   Creatinine, Ser 1.21 (H) 0.44 - 1.00 mg/dL   Calcium 8.1 (L) 8.9 - 10.3 mg/dL   Total Protein 7.8 6.5 - 8.1 g/dL   Albumin 2.9 (L) 3.5 - 5.0 g/dL   AST 46 (H) 15 - 41 U/L   ALT 28 0 - 44 U/L   Alkaline Phosphatase 426 (H) 38 - 126 U/L   Total Bilirubin 0.8 0.3 - 1.2 mg/dL   GFR, Estimated 51 (L) >60 mL/min    Comment: (NOTE) Calculated using the CKD-EPI Creatinine Equation (2021)    Anion gap 4 (L) 5 - 15    Comment: Performed at [facility], [address] [place], [place] [address]  CBC with Differential     Status: Abnormal   Collection Time: 10/27/21  8:31 AM  Result Value Ref Range   WBC 8.6 4.0 - 10.5 K/uL    Comment: WHITE COUNT CONFIRMED ON SMEAR   RBC 3.19 (L) 3.87 - 5.11 MIL/uL   Hemoglobin 9.2 (L) 12.0 - 15.0 g/dL   HCT 28.5 (L) 36.0 - 46.0 %   MCV 89.3 80.0 - 100.0 fL   MCH 28.8 26.0 - 34.0 pg   MCHC 32.3 30.0 - 36.0 g/dL   RDW 23.5 (H) 11.5 - 15.5 %   Platelets 354 150 - 400 K/uL   nRBC 0.0 0.0 - 0.2 %   Neutrophils Relative % 64 %   Neutro Abs 5.5 1.7 - 7.7 K/uL   Lymphocytes Relative 20 %   Lymphs Abs 1.7 0.7 - 4.0 K/uL   Monocytes Relative 11 %   Monocytes Absolute 0.9 0.1 - 1.0 K/uL   Eosinophils Relative 4 %   Eosinophils Absolute 0.3 0.0 - 0.5 K/uL   Basophils Relative 1 %   Basophils Absolute 0.1 0.0 - 0.1 K/uL   WBC Morphology DIFF CONFIRMED BY MANUAL    RBC Morphology MIXED RBC POPULATION WITH HYPOCHROMASIA NOTED    Smear Review Normal platelet morphology     Comment: PLATELETS APPEAR ADEQUATE   Immature Granulocytes 0 %   Abs Immature  Granulocytes 0.02 0.00 - 0.07 K/uL   Target Cells PRESENT     Comment: Performed at [facility], [address] [place], [place] [address]  Basic metabolic panel     Status: Abnormal   Collection Time: 10/27/21  9:14 AM  Result Value Ref Range   Sodium 137 135 - 145 mmol/L   Potassium 6.2 (H) 3.5 - 5.1 mmol/L   Chloride 113 (H) 98 - 111 mmol/L   CO2 21 (L) 22 - 32 mmol/L   Glucose, Bld 155 (H) 70 - 99 mg/dL    Comment: Glucose reference range applies only to samples taken after fasting for at least 8 hours.   BUN  20 8 - 23 mg/dL   Creatinine, Ser 1.26 (H) 0.44 - 1.00 mg/dL   Calcium 8.1 (L) 8.9 - 10.3 mg/dL   GFR, Estimated 48 (L) >60 mL/min    Comment: (NOTE) Calculated using the CKD-EPI Creatinine Equation (2021)    Anion gap 3 (L) 5 - 15    Comment: Performed at [facility], [address] [place], [place] [address]  Basic metabolic panel     Status: Abnormal   Collection Time: 10/29/21 11:14 AM  Result Value Ref Range   Sodium 135 135 - 145 mmol/L   Potassium 6.4 (HH) 3.5 - 5.1 mmol/L    Comment: RESULTS VERIFIED BY REPEAT TESTING CRITICAL RESULT CALLED TO, READ BACK BY AND VERIFIED WITH VENABLE,S AT 1147 ON 11.23.22 BY ISLEY,B    Chloride 109 98 - 111 mmol/L   CO2 21 (L) 22 - 32 mmol/L   Glucose, Bld 101 (H) 70 - 99 mg/dL    Comment: Glucose reference range applies only to samples taken after fasting for at least 8 hours.   BUN 20 8 - 23 mg/dL   Creatinine, Ser 1.29 (H) 0.44 - 1.00 mg/dL   Calcium 8.3 (L) 8.9 - 10.3 mg/dL   GFR, Estimated 47 (L) >60 mL/min    Comment: (NOTE) Calculated using the CKD-EPI Creatinine Equation (2021)    Anion gap 5 5 - 15    Comment: Performed at [facility], [address] [place], [place] [address]  Basic metabolic panel     Status: Abnormal   Collection Time: 10/29/21  2:59 PM  Result Value Ref Range   Sodium 134 (L) 135 - 145 mmol/L   Potassium 6.1 (H) 3.5 - 5.1 mmol/L   Chloride 110 98 - 111 mmol/L    CO2 20 (L) 22 - 32 mmol/L   Glucose, Bld 109 (H) 70 - 99 mg/dL    Comment: Glucose reference range applies only to samples taken after fasting for at least 8 hours.   BUN 19 8 - 23 mg/dL   Creatinine, Ser 1.22 (H) 0.44 - 1.00 mg/dL   Calcium 8.2 (L) 8.9 - 10.3 mg/dL   GFR, Estimated 50 (L) >60 mL/min    Comment: (NOTE) Calculated using the CKD-EPI Creatinine Equation (2021)    Anion gap 4 (L) 5 - 15    Comment: Performed at [facility], [address] [place], [place] [address]  CBC     Status: Abnormal   Collection Time: 10/29/21  2:59 PM  Result Value Ref Range   WBC 9.8 4.0 - 10.5 K/uL   RBC 3.07 (L) 3.87 - 5.11 MIL/uL   Hemoglobin 9.1 (L) 12.0 - 15.0 g/dL   HCT 27.1 (L) 36.0 - 46.0 %   MCV 88.3 80.0 - 100.0 fL   MCH 29.6 26.0 - 34.0 pg   MCHC 33.6 30.0 - 36.0 g/dL   RDW 23.9 (H) 11.5 - 15.5 %   Platelets 307 150 - 400 K/uL   nRBC 0.0 0.0 - 0.2 %    Comment: Performed at [facility], [place], [place] [address]  Troponin I (High Sensitivity)     Status: None   Collection Time: 10/29/21  2:59 PM  Result Value Ref Range   Troponin I (High Sensitivity) 9 <18 ng/L    Comment: (NOTE) Elevated high sensitivity troponin I (hsTnI) values and significant  changes across serial measurements may suggest ACS but many other  chronic and acute conditions are  known to elevate hsTnI results.  Refer to the "Links" section for chest pain algorithms and additional  guidance. Performed at [facility], [address], [place], [place] [address]   Magnesium     Status: None   Collection Time: 10/29/21  4:23 PM  Result Value Ref Range   Magnesium 2.1 1.7 - 2.4 mg/dL    Comment: Performed at [facility], [place], [place] [address]  Troponin I (High Sensitivity)     Status: None   Collection Time: 10/29/21  8:55 PM  Result Value Ref Range   Troponin I (High Sensitivity) 7 <18 ng/L    Comment: (NOTE) Elevated high  sensitivity troponin I (hsTnI) values and significant  changes across serial measurements may suggest ACS but many other  chronic and acute conditions are known to elevate hsTnI results.  Refer to the "Links" section for chest pain algorithms and additional  guidance. Performed at [facility], [address] [place], [place] [address]   CBG monitoring, ED     Status: None   Collection Time: 10/29/21 11:37 PM  Result Value Ref Range   Glucose-Capillary 70 70 - 99 mg/dL    Comment: Glucose reference range applies only to samples taken after fasting for at least 8 hours.  Basic metabolic panel     Status: Abnormal   Collection Time: 10/30/21  1:23 AM  Result Value Ref Range   Sodium 137 135 - 145 mmol/L   Potassium 4.8 3.5 - 5.1 mmol/L   Chloride 111 98 - 111 mmol/L   CO2 21 (L) 22 - 32 mmol/L   Glucose, Bld 66 (L) 70 - 99 mg/dL    Comment: Glucose reference range applies only to samples taken after fasting for at least 8 hours.   BUN 19 8 - 23 mg/dL   Creatinine, Ser 1.04 (H) 0.44 - 1.00 mg/dL   Calcium 8.8 (L) 8.9 - 10.3 mg/dL   GFR, Estimated >60 >60 mL/min    Comment: (NOTE) Calculated using the CKD-EPI Creatinine Equation (2021)    Anion gap 5 5 - 15    Comment: Performed at [facility], [address] [place], [place] [address]  Magnesium     Status: None   Collection Time: 11/10/21  8:14 AM  Result Value Ref Range   Magnesium 2.1 1.7 - 2.4 mg/dL    Comment: Performed at [facility], [address] [place], [place] [address]  Comprehensive metabolic panel     Status: Abnormal   Collection Time: 11/10/21  8:14 AM  Result Value Ref Range   Sodium 136 135 - 145 mmol/L   Potassium 4.4 3.5 - 5.1 mmol/L   Chloride 107 98 - 111 mmol/L   CO2 21 (L) 22 - 32 mmol/L   Glucose, Bld 98 70 - 99 mg/dL    Comment: Glucose reference range applies only to samples taken after fasting for at least 8 hours.   BUN 24 (H) 8 - 23 mg/dL   Creatinine, Ser 1.20  (H) 0.44 - 1.00 mg/dL   Calcium 8.6 (L) 8.9 - 10.3 mg/dL   Total Protein 8.3 (H) 6.5 - 8.1 g/dL   Albumin 3.0 (L) 3.5 - 5.0 g/dL   AST 45 (H) 15 - 41 U/L   ALT 31 0 - 44 U/L   Alkaline Phosphatase 406 (H) 38 - 126 U/L   Total Bilirubin 1.5 (H) 0.3 - 1.2 mg/dL   GFR, Estimated 51 (L) >60  mL/min    Comment: (NOTE) Calculated using the CKD-EPI Creatinine Equation (2021)    Anion gap 8 5 - 15    Comment: Performed at [facility], [address] [place], [place] [address]  CBC with Differential     Status: Abnormal   Collection Time: 11/10/21  8:14 AM  Result Value Ref Range   WBC 9.4 4.0 - 10.5 K/uL   RBC 3.36 (L) 3.87 - 5.11 MIL/uL   Hemoglobin 10.1 (L) 12.0 - 15.0 g/dL   HCT 29.8 (L) 36.0 - 46.0 %   MCV 88.7 80.0 - 100.0 fL   MCH 30.1 26.0 - 34.0 pg   MCHC 33.9 30.0 - 36.0 g/dL   RDW 26.3 (H) 11.5 - 15.5 %   Platelets 192 150 - 400 K/uL   nRBC 0.0 0.0 - 0.2 %   Neutrophils Relative % 61 %   Neutro Abs 5.8 1.7 - 7.7 K/uL   Lymphocytes Relative 22 %   Lymphs Abs 2.1 0.7 - 4.0 K/uL   Monocytes Relative 11 %   Monocytes Absolute 1.0 0.1 - 1.0 K/uL   Eosinophils Relative 5 %   Eosinophils Absolute 0.4 0.0 - 0.5 K/uL   Basophils Relative 1 %   Basophils Absolute 0.1 0.0 - 0.1 K/uL   Immature Granulocytes 0 %   Abs Immature Granulocytes 0.03 0.00 - 0.07 K/uL    Comment: Performed at [facility], [address] [place], [place] [address]  Magnesium     Status: None   Collection Time: 11/17/21  8:13 AM  Result Value Ref Range   Magnesium 1.9 1.7 - 2.4 mg/dL    Comment: Performed at [facility], [address] [place], [place] [address]  Comprehensive metabolic panel     Status: Abnormal   Collection Time: 11/17/21  8:13 AM  Result Value Ref Range   Sodium 133 (L) 135 - 145 mmol/L   Potassium 4.4 3.5 - 5.1 mmol/L   Chloride 105 98 - 111 mmol/L   CO2 21 (L) 22 - 32 mmol/L   Glucose, Bld 150 (H) 70 - 99 mg/dL    Comment: Glucose reference range  applies only to samples taken after fasting for at least 8 hours.   BUN 23 8 - 23 mg/dL   Creatinine, Ser 1.22 (H) 0.44 - 1.00 mg/dL   Calcium 8.3 (L) 8.9 - 10.3 mg/dL   Total Protein 7.9 6.5 - 8.1 g/dL   Albumin 2.7 (L) 3.5 - 5.0 g/dL   AST 52 (H) 15 - 41 U/L   ALT 49 (H) 0 - 44 U/L   Alkaline Phosphatase 397 (H) 38 - 126 U/L   Total Bilirubin 3.8 (H) 0.3 - 1.2 mg/dL   GFR, Estimated 50 (L) >60 mL/min    Comment: (NOTE) Calculated using the CKD-EPI Creatinine Equation (2021)    Anion gap 7 5 - 15    Comment: Performed at [facility], [address] [place], [place] [address]  CBC with Differential     Status: Abnormal   Collection Time: 11/17/21  8:13 AM  Result Value Ref Range   WBC 5.1 4.0 - 10.5 K/uL   RBC 2.88 (L) 3.87 - 5.11 MIL/uL   Hemoglobin 8.6 (L) 12.0 - 15.0 g/dL   HCT 24.6 (L) 36.0 - 46.0 %   MCV 85.4 80.0 - 100.0 fL   MCH 29.9 26.0 - 34.0 pg   MCHC 35.0 30.0 - 36.0 g/dL   RDW 25.0 (H)  11.5 - 15.5 %   Platelets 112 (L) 150 - 400 K/uL   nRBC 0.0 0.0 - 0.2 %   Neutrophils Relative % 55 %   Neutro Abs 2.8 1.7 - 7.7 K/uL   Lymphocytes Relative 31 %   Lymphs Abs 1.6 0.7 - 4.0 K/uL   Monocytes Relative 11 %   Monocytes Absolute 0.6 0.1 - 1.0 K/uL   Eosinophils Relative 1 %   Eosinophils Absolute 0.1 0.0 - 0.5 K/uL   Basophils Relative 1 %   Basophils Absolute 0.0 0.0 - 0.1 K/uL   WBC Morphology DIFF. CONFIRMED BY SMEAR    RBC Morphology MIXED RBC POPULATION    Smear Review Normal platelet morphology     Comment: PLATELETS APPEAR DECREASED   Immature Granulocytes 1 %   Abs Immature Granulocytes 0.04 0.00 - 0.07 K/uL   Target Cells PRESENT     Comment: Performed at [facility], [address] [place], [place] [address]  Protime-INR     Status: None   Collection Time: 11/17/21  2:05 PM  Result Value Ref Range   Prothrombin Time 13.6 11.4 - 15.2 seconds   INR 1.0 0.8 - 1.2    Comment: (NOTE) INR goal varies based on device and disease  states. Performed at [facility], [address] [place], [place] [address]   Lipase, blood     Status: None   Collection Time: 11/17/21  2:05 PM  Result Value Ref Range   Lipase 30 11 - 51 U/L    Comment: Performed at [facility], [address] [place], [place] [address]  Magnesium     Status: None   Collection Time: 11/27/21  8:27 AM  Result Value Ref Range   Magnesium 2.3 1.7 - 2.4 mg/dL    Comment: Performed at [facility], [address] [place], [place] [address]  Comprehensive metabolic panel     Status: Abnormal   Collection Time: 11/27/21  8:27 AM  Result Value Ref Range   Sodium 135 135 - 145 mmol/L   Potassium 4.6 3.5 - 5.1 mmol/L   Chloride 107 98 - 111 mmol/L   CO2 21 (L) 22 - 32 mmol/L   Glucose, Bld 128 (H) 70 - 99 mg/dL    Comment: Glucose reference range applies only to samples taken after fasting for at least 8 hours.   BUN 19 8 - 23 mg/dL   Creatinine, Ser 1.24 (H) 0.44 - 1.00 mg/dL   Calcium 8.4 (L) 8.9 - 10.3 mg/dL   Total Protein 7.8 6.5 - 8.1 g/dL   Albumin 2.7 (L) 3.5 - 5.0 g/dL   AST 45 (H) 15 - 41 U/L   ALT 32 0 - 44 U/L   Alkaline Phosphatase 446 (H) 38 - 126 U/L   Total Bilirubin 2.1 (H) 0.3 - 1.2 mg/dL   GFR, Estimated 49 (L) >60 mL/min    Comment: (NOTE) Calculated using the CKD-EPI Creatinine Equation (2021)    Anion gap 7 5 - 15    Comment: Performed at [facility], [address] [place],  [address]  CBC with Differential     Status: Abnormal   Collection Time: 11/27/21  8:27 AM  Result Value Ref Range   WBC 8.2 4.0 - 10.5 K/uL   RBC 2.78 (L) 3.87 - 5.11 MIL/uL   Hemoglobin 8.3 (L) 12.0 - 15.0 g/dL   HCT 25.6 (L) 36.0 - 46.0 %   MCV 92.1 80.0 - 100.0  fL   MCH 29.9 26.0 - 34.0 pg   MCHC 32.4 30.0 - 36.0 g/dL   RDW 28.5 (H) 11.5 - 15.5 %   Platelets 322 150 - 400 K/uL   nRBC 0.0 0.0 - 0.2 %   Neutrophils Relative % 67 %   Neutro Abs 5.4 1.7 - 7.7 K/uL   Lymphocytes Relative 20 %    Lymphs Abs 1.7 0.7 - 4.0 K/uL   Monocytes Relative 11 %   Monocytes Absolute 0.9 0.1 - 1.0 K/uL   Eosinophils Relative 2 %   Eosinophils Absolute 0.2 0.0 - 0.5 K/uL   Basophils Relative 0 %   Basophils Absolute 0.0 0.0 - 0.1 K/uL   Immature Granulocytes 0 %   Abs Immature Granulocytes 0.02 0.00 - 0.07 K/uL    Comment: Performed at [facility], [address] [place], [place] [address]  Magnesium     Status: None   Collection Time: 12/12/21  8:20 AM  Result Value Ref Range   Magnesium 2.1 1.7 - 2.4 mg/dL    Comment: Performed at [facility], [address] [place], [place] [address]  Comprehensive metabolic panel     Status: Abnormal   Collection Time: 12/12/21  8:20 AM  Result Value Ref Range   Sodium 135 135 - 145 mmol/L   Potassium 4.2 3.5 - 5.1 mmol/L   Chloride 107 98 - 111 mmol/L   CO2 22 22 - 32 mmol/L   Glucose, Bld 208 (H) 70 - 99 mg/dL    Comment: Glucose reference range applies only to samples taken after fasting for at least 8 hours.   BUN 14 8 - 23 mg/dL   Creatinine, Ser 1.09 (H) 0.44 - 1.00 mg/dL   Calcium 8.4 (L) 8.9 - 10.3 mg/dL   Total Protein 8.0 6.5 - 8.1 g/dL   Albumin 2.5 (L) 3.5 - 5.0 g/dL   AST 38 15 - 41 U/L   ALT 27 0 - 44 U/L   Alkaline Phosphatase 577 (H) 38 - 126 U/L   Total Bilirubin 1.4 (H) 0.3 - 1.2 mg/dL   GFR, Estimated 57 (L) >60 mL/min    Comment: (NOTE) Calculated using the CKD-EPI Creatinine Equation (2021)    Anion gap 6 5 - 15    Comment: Performed at [facility], [address] [place], [place] [address]  CBC with Differential     Status: Abnormal   Collection Time: 12/12/21  8:20 AM  Result Value Ref Range   WBC 24.8 (H) 4.0 - 10.5 K/uL   RBC 2.75 (L) 3.87 - 5.11 MIL/uL   Hemoglobin 8.7 (L) 12.0 - 15.0 g/dL   HCT 27.6 (L) 36.0 - 46.0 %   MCV 100.4 (H) 80.0 - 100.0 fL   MCH 31.6 26.0 - 34.0 pg   MCHC 31.5 30.0 - 36.0 g/dL   RDW 28.9 (H) 11.5 - 15.5 %   Platelets 229 150 - 400 K/uL   nRBC 0.2 0.0 -  0.2 %   Neutrophils Relative % 76 %   Neutro Abs 19.0 (H) 1.7 - 7.7 K/uL   Lymphocytes Relative 13 %   Lymphs Abs 3.1 0.7 - 4.0 K/uL   Monocytes Relative 7 %   Monocytes Absolute 1.8 (H) 0.1 - 1.0 K/uL   Eosinophils Relative 1 %   Eosinophils Absolute 0.2 0.0 - 0.5 K/uL   Basophils Relative 1 %   Basophils Absolute 0.1 0.0 - 0.1 K/uL   Immature Granulocytes 2 %  Abs Immature Granulocytes 0.40 (H) 0.00 - 0.07 K/uL    Comment: Performed at [facility], [address] [place], [place] [address]  Bilirubin, fractionated(tot/dir/indir)     Status: Abnormal   Collection Time: 12/12/21  8:20 AM  Result Value Ref Range   Total Bilirubin 1.2 0.3 - 1.2 mg/dL   Bilirubin, Direct 0.6 (H) 0.0 - 0.2 mg/dL   Indirect Bilirubin 0.6 0.3 - 0.9 mg/dL    Comment: Performed at [facility], [address] [place], [place] [address]  CBC with Differential     Status: Abnormal   Collection Time: 12/19/21  8:18 AM  Result Value Ref Range   WBC 6.4 4.0 - 10.5 K/uL   RBC 2.43 (L) 3.87 - 5.11 MIL/uL   Hemoglobin 7.8 (L) 12.0 - 15.0 g/dL   HCT 24.3 (L) 36.0 - 46.0 %   MCV 100.0 80.0 - 100.0 fL   MCH 32.1 26.0 - 34.0 pg   MCHC 32.1 30.0 - 36.0 g/dL   RDW 24.1 (H) 11.5 - 15.5 %   Platelets 181 150 - 400 K/uL   nRBC 0.0 0.0 - 0.2 %   Neutrophils Relative % 63 %   Neutro Abs 4.4 1.7 - 7.7 K/uL   Lymphocytes Relative 27 %   Lymphs Abs 1.8 0.7 - 4.0 K/uL   Monocytes Relative 7 %   Monocytes Absolute 0.5 0.1 - 1.0 K/uL   Eosinophils Relative 1 %   Eosinophils Absolute 0.1 0.0 - 0.5 K/uL   Basophils Relative 1 %   Basophils Absolute 0.0 0.0 - 0.1 K/uL   Immature Granulocytes 1 %   Abs Immature Granulocytes 0.04 0.00 - 0.07 K/uL    Comment: Performed at [facility], [address] [place], [place] [address]  Comprehensive metabolic panel     Status: Abnormal   Collection Time: 12/19/21  8:18 AM  Result Value Ref Range   Sodium 136 135 - 145 mmol/L   Potassium 4.4 3.5 - 5.1  mmol/L   Chloride 111 98 - 111 mmol/L   CO2 21 (L) 22 - 32 mmol/L   Glucose, Bld 121 (H) 70 - 99 mg/dL    Comment: Glucose reference range applies only to samples taken after fasting for at least 8 hours.   BUN 22 8 - 23 mg/dL   Creatinine, Ser 1.05 (H) 0.44 - 1.00 mg/dL   Calcium 7.9 (L) 8.9 - 10.3 mg/dL   Total Protein 7.8 6.5 - 8.1 g/dL   Albumin 2.6 (L) 3.5 - 5.0 g/dL   AST 61 (H) 15 - 41 U/L   ALT 43 0 - 44 U/L   Alkaline Phosphatase 494 (H) 38 - 126 U/L   Total Bilirubin 1.2 0.3 - 1.2 mg/dL   GFR, Estimated >60 >60 mL/min    Comment: (NOTE) Calculated using the CKD-EPI Creatinine Equation (2021)    Anion gap 4 (L) 5 - 15    Comment: Performed at [facility], [address], [place], [place] [address]  Magnesium     Status: None   Collection Time: 12/19/21  8:18 AM  Result Value Ref Range   Magnesium 2.3 1.7 - 2.4 mg/dL    Comment: Performed at [facility], [address] [place], [place] [address]  POCT HgB A1C     Status: None   Collection Time: 12/26/21 10:34 AM  Result Value Ref Range   Hemoglobin A1C 5.4 4.0 - 5.6 %   HbA1c POC (<> result,  manual entry)     HbA1c, POC (prediabetic range)     HbA1c, POC (controlled diabetic range)      Diabetic Foot Exam: Diabetic Foot Exam - Simple   Simple Foot Form Visual Inspection See comments: Yes Sensation Testing Intact to touch and monofilament testing bilaterally: Yes Pulse Check Posterior Tibialis and Dorsalis pulse intact bilaterally: Yes Comments [place], dry feet      PHQ2/9: Depression screen [facility] 2/9 12/26/2021 11/13/2021 11/13/2021 09/19/2021 05/02/2021  Decreased Interest 0 0 0 0 1  Down, Depressed, Hopeless 0 0 0 2 1  PHQ - 2 Score 0 0 0 2 2  Altered sleeping 0 0 0 0 0  Tired, decreased energy 0 0 0 1 0  Change in appetite 0 0 0 0 1  Feeling bad or failure about yourself  0 0 0 0 0  Trouble concentrating 0 0 0 0 0  Moving slowly or fidgety/restless 0 0 0 0 0  Suicidal thoughts 0 0 0 0  0  PHQ-9 Score 0 0 0 3 3  Difficult doing work/chores - Not difficult at all Not difficult at all - -  Some recent data might be hidden    phq 9 is negative   Fall Risk: Fall Risk  12/26/2021 11/13/2021 11/13/2021 09/19/2021 04/29/2021  Falls in the past year? 0 0 0 0 0  Number falls in past yr: 0 0 0 0 0  Injury with Fall? 0 0 0 0 0  Comment - - - - -  Risk for fall due to : No Fall Risks - No Fall Risks No Fall Risks -  Follow up Falls prevention discussed - Falls prevention discussed Falls prevention discussed Falls prevention discussed      Functional Status Survey: Is the patient deaf or have difficulty hearing?: No Does the patient have difficulty seeing, even when wearing glasses/contacts?: No Does the patient have difficulty concentrating, remembering, or making decisions?: No Does the patient have difficulty walking or climbing stairs?: No Does the patient have difficulty dressing or bathing?: No Does the patient have difficulty doing errands alone such as visiting a doctor's office or shopping?: No    Assessment & Plan  1. Diabetes mellitus type 2 with complications (HCC)  - POCT HgB A1C - HM Diabetes Foot Exam - Microalbumin / creatinine urine ratio  2. Hypertension associated with type 2 diabetes mellitus ([place])  At goal   3. Intrahepatic cholangiocarcinoma ([place])  Keep follow up with oncologist   4. Controlled type 2 diabetes mellitus with microalbuminuria, with long-term current use of insulin (HCC)  Go down to 30 units of Tresiba per day, A1C is too low   5. Moderate protein-calorie malnutrition ([place])  She is eating and having high protein snacks between meals   6. Asthma-COPD overlap syndrome (HCC)  Continue Trelegy   7. Major depression in remission [facility])  Doing well, continue medication  8. Dyslipidemia associated with type 2 diabetes mellitus ([place])   9. Benign hypertension   10. Vitamin D deficiency   11. Bilateral lower extremity  edema   12. Anemia of chronic disease

## 2021-12-27 LAB — MICROALBUMIN / CREATININE URINE RATIO
Creatinine, Urine: 127 mg/dL (ref 20–275)
Microalb Creat Ratio: 35 mcg/mg creat — ABNORMAL HIGH (ref <30)
Microalb, Ur: 4.5 mg/dL

## 2022-01-02 ENCOUNTER — Inpatient Hospital Stay: Payer: Medicare HMO

## 2022-01-02 ENCOUNTER — Inpatient Hospital Stay (HOSPITAL_BASED_OUTPATIENT_CLINIC_OR_DEPARTMENT_OTHER): Payer: Medicare HMO | Admitting: Oncology

## 2022-01-02 ENCOUNTER — Encounter: Payer: Self-pay | Admitting: Oncology

## 2022-01-02 ENCOUNTER — Other Ambulatory Visit: Payer: Self-pay

## 2022-01-02 VITALS — BP 134/66 | HR 83 | Temp 97.5°F | Resp 16 | Ht 64.0 in | Wt 163.4 lb

## 2022-01-02 DIAGNOSIS — E538 Deficiency of other specified B group vitamins: Secondary | ICD-10-CM

## 2022-01-02 DIAGNOSIS — T451X5A Adverse effect of antineoplastic and immunosuppressive drugs, initial encounter: Secondary | ICD-10-CM

## 2022-01-02 DIAGNOSIS — Z79899 Other long term (current) drug therapy: Secondary | ICD-10-CM | POA: Diagnosis not present

## 2022-01-02 DIAGNOSIS — E119 Type 2 diabetes mellitus without complications: Secondary | ICD-10-CM | POA: Diagnosis not present

## 2022-01-02 DIAGNOSIS — C221 Intrahepatic bile duct carcinoma: Secondary | ICD-10-CM

## 2022-01-02 DIAGNOSIS — I10 Essential (primary) hypertension: Secondary | ICD-10-CM | POA: Diagnosis not present

## 2022-01-02 DIAGNOSIS — D6481 Anemia due to antineoplastic chemotherapy: Secondary | ICD-10-CM | POA: Diagnosis not present

## 2022-01-02 DIAGNOSIS — R69 Illness, unspecified: Secondary | ICD-10-CM | POA: Diagnosis not present

## 2022-01-02 DIAGNOSIS — Z90710 Acquired absence of both cervix and uterus: Secondary | ICD-10-CM | POA: Diagnosis not present

## 2022-01-02 DIAGNOSIS — D509 Iron deficiency anemia, unspecified: Secondary | ICD-10-CM | POA: Diagnosis not present

## 2022-01-02 DIAGNOSIS — F17210 Nicotine dependence, cigarettes, uncomplicated: Secondary | ICD-10-CM

## 2022-01-02 DIAGNOSIS — Z5111 Encounter for antineoplastic chemotherapy: Secondary | ICD-10-CM

## 2022-01-02 LAB — CBC WITH DIFFERENTIAL/PLATELET
Abs Immature Granulocytes: 0.01 10*3/uL (ref 0.00–0.07)
Basophils Absolute: 0 10*3/uL (ref 0.0–0.1)
Basophils Relative: 0 %
Eosinophils Absolute: 0.3 10*3/uL (ref 0.0–0.5)
Eosinophils Relative: 5 %
HCT: 24.2 % — ABNORMAL LOW (ref 36.0–46.0)
Hemoglobin: 7.4 g/dL — ABNORMAL LOW (ref 12.0–15.0)
Immature Granulocytes: 0 %
Lymphocytes Relative: 24 %
Lymphs Abs: 1.4 10*3/uL (ref 0.7–4.0)
MCH: 31.2 pg (ref 26.0–34.0)
MCHC: 30.6 g/dL (ref 30.0–36.0)
MCV: 102.1 fL — ABNORMAL HIGH (ref 80.0–100.0)
Monocytes Absolute: 0.7 10*3/uL (ref 0.1–1.0)
Monocytes Relative: 12 %
Neutro Abs: 3.3 10*3/uL (ref 1.7–7.7)
Neutrophils Relative %: 59 %
Platelets: 181 10*3/uL (ref 150–400)
RBC: 2.37 MIL/uL — ABNORMAL LOW (ref 3.87–5.11)
RDW: 23.4 % — ABNORMAL HIGH (ref 11.5–15.5)
WBC: 5.6 10*3/uL (ref 4.0–10.5)
nRBC: 0 % (ref 0.0–0.2)

## 2022-01-02 LAB — COMPREHENSIVE METABOLIC PANEL
ALT: 21 U/L (ref 0–44)
AST: 27 U/L (ref 15–41)
Albumin: 2.6 g/dL — ABNORMAL LOW (ref 3.5–5.0)
Alkaline Phosphatase: 380 U/L — ABNORMAL HIGH (ref 38–126)
Anion gap: 5 (ref 5–15)
BUN: 12 mg/dL (ref 8–23)
CO2: 22 mmol/L (ref 22–32)
Calcium: 8.2 mg/dL — ABNORMAL LOW (ref 8.9–10.3)
Chloride: 112 mmol/L — ABNORMAL HIGH (ref 98–111)
Creatinine, Ser: 1 mg/dL (ref 0.44–1.00)
GFR, Estimated: >60 mL/min (ref >60)
Glucose, Bld: 133 mg/dL — ABNORMAL HIGH (ref 70–99)
Potassium: 4.9 mmol/L (ref 3.5–5.1)
Sodium: 139 mmol/L (ref 135–145)
Total Bilirubin: 1 mg/dL (ref 0.3–1.2)
Total Protein: 7.5 g/dL (ref 6.5–8.1)

## 2022-01-02 LAB — MAGNESIUM: Magnesium: 2.2 mg/dL (ref 1.7–2.4)

## 2022-01-02 LAB — VITAMIN B12: Vitamin B-12: 992 pg/mL — ABNORMAL HIGH (ref 180–914)

## 2022-01-02 LAB — FERRITIN: Ferritin: 42 ng/mL (ref 11–307)

## 2022-01-02 LAB — FOLATE: Folate: 5.8 ng/mL — ABNORMAL LOW (ref >5.9)

## 2022-01-02 LAB — IRON AND TIBC
Iron: 36 ug/dL (ref 28–170)
Saturation Ratios: 12 % (ref 10.4–31.8)
TIBC: 298 ug/dL (ref 250–450)
UIBC: 262 ug/dL

## 2022-01-02 LAB — TSH: TSH: 1.352 u[IU]/mL (ref 0.350–4.500)

## 2022-01-02 MED ORDER — SODIUM CHLORIDE 0.9 % IV SOLN
131.7000 mg | Freq: Once | INTRAVENOUS | Status: AC
  Administered 2022-01-02: 130 mg via INTRAVENOUS
  Filled 2022-01-02: qty 13

## 2022-01-02 MED ORDER — FOLIC ACID 1 MG PO TABS: 1.0000 mg | ORAL_TABLET | Freq: Every day | ORAL | 3 refills | Status: DC

## 2022-01-02 MED ORDER — SODIUM CHLORIDE 0.9 % IV SOLN
Freq: Once | INTRAVENOUS | Status: AC
  Filled 2022-01-02: qty 250

## 2022-01-02 MED ORDER — SODIUM CHLORIDE 0.9 % IV SOLN
10.0000 mg | Freq: Once | INTRAVENOUS | Status: AC
  Administered 2022-01-02: 10 mg via INTRAVENOUS
  Filled 2022-01-02: qty 10

## 2022-01-02 MED ORDER — HEPARIN SOD (PORK) LOCK FLUSH 100 UNIT/ML IV SOLN
500.0000 [IU] | Freq: Once | INTRAVENOUS | Status: AC
  Filled 2022-01-02: qty 5

## 2022-01-02 MED ORDER — SODIUM CHLORIDE 0.9% FLUSH
10.0000 mL | INTRAVENOUS | Status: DC | PRN
  Administered 2022-01-02: 10 mL
  Filled 2022-01-02: qty 10

## 2022-01-02 MED ORDER — HEPARIN SOD (PORK) LOCK FLUSH 100 UNIT/ML IV SOLN
INTRAVENOUS | Status: AC
  Administered 2022-01-02: 500 [IU] via INTRAVENOUS
  Filled 2022-01-02: qty 5

## 2022-01-02 MED ORDER — SODIUM CHLORIDE 0.9% FLUSH
10.0000 mL | Freq: Once | INTRAVENOUS | Status: AC
  Administered 2022-01-02: 10 mL via INTRAVENOUS
  Filled 2022-01-02: qty 10

## 2022-01-02 MED ORDER — PALONOSETRON HCL INJECTION 0.25 MG/5ML
0.2500 mg | Freq: Once | INTRAVENOUS | Status: AC
  Administered 2022-01-02: 0.25 mg via INTRAVENOUS

## 2022-01-02 MED ORDER — SODIUM CHLORIDE 0.9 % IV SOLN
1400.0000 mg | Freq: Once | INTRAVENOUS | Status: AC
  Administered 2022-01-02: 1400 mg via INTRAVENOUS
  Filled 2022-01-02: qty 26.3

## 2022-01-02 NOTE — Progress Notes (Signed)
Hematology/Oncology Consult note [facility]  [phone] [phone]  Patient Care Team: Steele Sizer, MD as PCP - General (Family Medicine) Clent Jacks, RN as Oncology Nurse Navigator [place], [place], LCSW as Social Worker Germaine Pomfret, [facility] (Pharmacist)   Name of the patient: Janice Short  [id number]  Apr 09, 1961   Date of visit: 01/02/22  Diagnosis- metastatic intrahepatic cholangiocarcinoma  Chief complaint/ Reason for visit-on treatment assessment prior to cycle 9-day 1 of gemcitabine carboplatin chemotherapy  Heme/Onc history:  Patient is a 61 year old African-American female with a past medical history significant for hypertension hyperlipidemia and type 2 diabetes who presented to the ER with symptoms of cramping abdominal pain and nausea.  This was followed by right upper quadrant ultrasound which showed multiple echogenic masses in the liver with nodular contour of the liver possibly suggestive of cirrhosis.  Several nodular masses in the vicinity of the pancreatic head and porta hepatic lymph nodes.  This was followed by an MRI abdomen and MRCP.  It showed right hepatic lobe masslike area measuring 4.9 x 2.9 cm.  Diffusion abnormality in the left hepatic lobe with mild left-sided biliary ductal dilatation and another area of abnormality measuring 1.4 x 1 cm.  Bulky celiac adenopathy 3 x 2.1 cm tracking into gastrohepatic recess.  Suspected portal venous invasion with convex protrusion in the right portal vein.  Left adrenal lesion measuring 2.9 x 1.5 cm.  Findings concerning for cholangiocarcinoma or biphenotypic cholangiole hepatocellular neoplasm.     Patient underwent CT-guided liver biopsy Which was consistent with adenocarcinoma poorly differentiated.  Immunohistochemistry showed CK7 positivity, CDX2 negative, Heppar negative, GATA3 negative.  CK19 positive.  Differentials include cholangiocarcinoma, pancreatic, upper  GI.  Lung and breast less likely.  However given CK19 positivity most compatible with cholangiocarcinoma.   Patient's case was discussed at tumor board and it was felt as if the lymphadenopathy was more like a confluent primary tumor mass.  Patient was referred to [facility] for second opinion to see if she would be a candidate for surgery.However upon their review of the PET scan it was deemed that this was indeed periportal adenopathy.  They also did a CT chest abdomen and pelvis with MIPS protocol and she was also found to have portocaval lymph node measuring 2.4 cm which was more consistent with metastatic disease.  She was deemed unresectable   NGS testing showed BRCA2, FGFR2, PIK3CA, RAD 54L, CDC73, CDKN2 A/B CR EBBP rearrangement exon 31    Patient has been getting gemcitabine cisplatin chemotherapy 2 weeks on 1 week off since August 2022    Interval history-patient has been having problems getting along with her son and is tearful about those issues.  Otherwise tolerating chemotherapy well without any significant side effects.  ECOG PS- 1 Pain scale- 0 Opioid associated constipation- no  Review of systems- Review of Systems  Constitutional:  Positive for malaise/fatigue. Negative for chills, fever and weight loss.  HENT:  Negative for congestion, ear discharge and nosebleeds.   Eyes:  Negative for blurred vision.  Respiratory:  Negative for cough, hemoptysis, sputum production, shortness of breath and wheezing.   Cardiovascular:  Negative for chest pain, palpitations, orthopnea and claudication.  Gastrointestinal:  Negative for abdominal pain, blood in stool, constipation, diarrhea, heartburn, melena, nausea and vomiting.  Genitourinary:  Negative for dysuria, flank pain, frequency, hematuria and urgency.  Musculoskeletal:  Negative for back pain, joint pain and myalgias.  Skin:  Negative for rash.  Neurological:  Negative for dizziness, tingling, focal weakness, seizures, weakness and  headaches.  Endo/Heme/Allergies:  Does not bruise/bleed easily.  Psychiatric/Behavioral:  Negative for depression and suicidal ideas. The patient does not have insomnia.       Allergies  Allergen Reactions   Other Shortness Of Breath    Cat dander and grass pollen   Cisplatin Itching   Bydureon [Exenatide] Other (See Comments)    Pain with injection not allergy     Past Medical History:  Diagnosis Date   Allergy    Asthma    Carpal tunnel syndrome on left    Cervical radiculitis    Chronic osteoarthritis    Corns and callosity    Depression    Dermatophytosis of foot    Diabetes mellitus without complication (HCC)    Gout    Hyperlipidemia    Hypertension    Impingement syndrome of left shoulder    Intrahepatic cholangiocarcinoma (HCC)    Lumbosacral neuritis    Microalbuminuria    Obesity    Supraventricular tachycardia ([place]) 07/01/2015   SVT (supraventricular tachycardia) (HCC)    Tenosynovitis of wrist    Vitamin D deficiency      Past Surgical History:  Procedure Laterality Date   ABDOMINAL HYSTERECTOMY     COLONOSCOPY WITH PROPOFOL N/A 05/06/2021   Procedure: COLONOSCOPY WITH PROPOFOL;  Surgeon: Lucilla Lame, MD;  Location: [facility] ENDOSCOPY;  Service: Endoscopy;  Laterality: N/A;   ESOPHAGOGASTRODUODENOSCOPY (EGD) WITH PROPOFOL N/A 05/06/2021   Procedure: ESOPHAGOGASTRODUODENOSCOPY (EGD) WITH PROPOFOL;  Surgeon: Lucilla Lame, MD;  Location: [facility] ENDOSCOPY;  Service: Endoscopy;  Laterality: N/A;   PORTA CATH INSERTION N/A 07/17/2021   Procedure: PORTA CATH INSERTION;  Surgeon: Algernon Huxley, MD;  Location: [place] [facility] CV LAB;  Service: Cardiovascular;  Laterality: N/A;    Social History   Socioeconomic History   Marital status: Single    Spouse name: Not on file   Number of children: 2   Years of education: Not on file   Highest education level: Not on file  Occupational History   Occupation: disable     Comment: from chronic back pain, 2019  Tobacco Use    Smoking status: Some Days    Types: Cigarettes   Smokeless tobacco: Never   Tobacco comments:    1 per day 01/30/21 does not inhale it  Vaping Use   Vaping Use: Never used  Substance and Sexual Activity   Alcohol use: Not Currently   Drug use: No   Sexual activity: Not Currently  Other Topics Concern   Not on file  Social History Narrative   She is on disability for chronic back pain. Pt's son lives with her   Social Determinants of Health   Financial Resource Strain: Medium Risk   Difficulty of Paying Living Expenses: Somewhat hard  Food Insecurity: No Food Insecurity   Worried About Charity fundraiser in the Last Year: Never true   Arboriculturist in the Last Year: Never true  Transportation Needs: No Transportation Needs   Lack of Transportation (Medical): No   Lack of Transportation (Non-Medical): No  Physical Activity: Inactive   Days of Exercise per Week: 0 days   Minutes of Exercise per Session: 0 min  Stress: Stress Concern Present   Feeling of Stress : To some extent  Social Connections: Moderately Isolated   Frequency of Communication with Friends and Family: More than three times a week   Frequency of Social Gatherings with Friends and  Family: Three times a week   Attends Religious Services: More than 4 times per year   Active Member of Clubs or Organizations: No   Attends Archivist Meetings: Never   Marital Status: Never married  Human resources officer Violence: Not At Risk   Fear of Current or Ex-Partner: No   Emotionally Abused: No   Physically Abused: No   Sexually Abused: No    Family History  Problem Relation Age of Onset   Heart attack Mother    CAD Mother    Kidney disease Father      Current Outpatient Medications:    albuterol (VENTOLIN HFA) 108 (90 Base) MCG/ACT inhaler, INHALE 2 PUFFS BY MOUTH EVERY 6 HOURS AS NEEDED FOR WHEEZING AND FOR SHORTNESS OF BREATH, Disp: 18 g, Rfl: 1   aspirin 81 MG tablet, Take 1 tablet by mouth daily.,  Disp: , Rfl:    atorvastatin (LIPITOR) 40 MG tablet, Take 1 tablet (40 mg total) by mouth at bedtime., Disp: 90 tablet, Rfl: 1   blood glucose meter kit and supplies, Dispense based on patient and insurance preference. Use up to four times daily as directed. (FOR ICD-10 E10.9, E11.9)., Disp: 1 each, Rfl: 0   diphenhydramine-acetaminophen (TYLENOL PM) 25-500 MG TABS tablet, Take 1 tablet by mouth at bedtime as needed., Disp: , Rfl:    escitalopram (LEXAPRO) 20 MG tablet, Take 1 tablet (20 mg total) by mouth daily., Disp: 90 tablet, Rfl: 1   fluticasone (FLONASE) 50 MCG/ACT nasal spray, USE 2 SPRAY(S) IN EACH NOSTRIL AT BEDTIME (Patient taking differently: Place 2 sprays into both nostrils daily as needed. USE 2 SPRAY(S) IN EACH NOSTRIL AT BEDTIME), Disp: 16 g, Rfl: 0   Fluticasone-Umeclidin-Vilant (TRELEGY ELLIPTA) 100-62.5-25 MCG/INH AEPB, Take 1 puff by mouth daily., Disp: 180 each, Rfl: 1   gabapentin (NEURONTIN) 300 MG capsule, Take 2 capsules by mouth twice daily, Disp: 360 capsule, Rfl: 0   Insulin Pen Needle 31G X 8 MM MISC, 1 each by Does not apply route daily., Disp: 100 each, Rfl: 1   TRESIBA FLEXTOUCH 100 UNIT/ML FlexTouch Pen, INJECT 12-30 UNITS OF INSULIN INTO THE SKIN DAILY TO KEEP FASTING GLUCOSE BETWEEN 100-140, Disp: 15 mL, Rfl: 0 No current facility-administered medications for this visit.  Facility-Administered Medications Ordered in Other Visits:    sodium chloride flush (NS) 0.9 % injection 10 mL, 10 mL, Intracatheter, PRN, Sindy Guadeloupe, MD, 10 mL at 01/02/22 1008  Physical exam:  Vitals:   01/02/22 0900  BP: 134/66  Pulse: 83  Resp: 16  Temp: (!) 97.5 F (36.4 C)  TempSrc: Tympanic  Weight: 163 lb 6.4 oz (74.1 kg)  Height: $Remove'5\' 4"'MBCgAeh$  (1.626 m)   Physical Exam Constitutional:      General: She is not in acute distress. Cardiovascular:     Rate and Rhythm: Normal rate and regular rhythm.     Heart sounds: Normal heart sounds.  Pulmonary:     Effort: Pulmonary effort  is normal.     Breath sounds: Normal breath sounds.  Abdominal:     General: Bowel sounds are normal.     Palpations: Abdomen is soft.  Skin:    General: Skin is warm and dry.  Neurological:     Mental Status: She is alert and oriented to person, place, and time.     CMP Latest Ref Rng & Units 01/02/2022  Glucose 70 - 99 mg/dL 133(H)  BUN 8 - 23 mg/dL 12  Creatinine 0.44 - 1.00  mg/dL 1.00  Sodium 135 - 145 mmol/L 139  Potassium 3.5 - 5.1 mmol/L 4.9  Chloride 98 - 111 mmol/L 112(H)  CO2 22 - 32 mmol/L 22  Calcium 8.9 - 10.3 mg/dL 8.2(L)  Total Protein 6.5 - 8.1 g/dL 7.5  Total Bilirubin 0.3 - 1.2 mg/dL 1.0  Alkaline Phos 38 - 126 U/L 380(H)  AST 15 - 41 U/L 27  ALT 0 - 44 U/L 21   CBC Latest Ref Rng & Units 01/02/2022  WBC 4.0 - 10.5 K/uL 5.6  Hemoglobin 12.0 - 15.0 g/dL 7.4(L)  Hematocrit 36.0 - 46.0 % 24.2(L)  Platelets 150 - 400 K/uL 181     Assessment and plan- Patient is a 61 y.o. female with stage IV intrahepatic cholangiocarcinoma.  She is here for on treatment assessment prior to cycle 9-day 1 of gemcitabine carboplatin chemotherapy  Patient was switched from cisplatin to carboplatin chemotherapy due to allergic reaction.  She is getting carboplatin at AUC 1.5 along with gemcitabine 800 mg per metered square.  She has been having progressively worsening anemia and her hemoglobin is down to 7.4.  We will therefore plan to give her chemotherapy week on week off.  CA 19-9 from today is pending  Chemo induced anemia: Anemia panel was checked today and she does have borderline low ferritin level of 42 with an iron saturationOf 12%.  We will therefore proceed with 5 doses of Venofer which will be given with IV chemo treatment.  Folate levels were also low at 5.9 we will send her a prescription for oral folic acid 1 mg daily.  I will see her back in 2 weeks for cycle 9-day 15 of gemcitabine carboplatin chemotherapy   Visit Diagnosis 1. Intrahepatic cholangiocarcinoma ([place])    2. Antineoplastic chemotherapy induced anemia   3. Iron deficiency anemia, unspecified iron deficiency anemia type   4. Folate deficiency   5. Encounter for antineoplastic chemotherapy      Dr. Randa Evens, MD, MPH [facility] at [facility] [phone] 01/02/2022 1:37 PM

## 2022-01-02 NOTE — Progress Notes (Signed)
1001- Hemoglobin: 7.4 today. MD, Dr. Janese Banks, notified and aware. Per MD order: okay to proceed with scheduled Gemzar and Carboplatin treatment today.

## 2022-01-02 NOTE — Progress Notes (Signed)
Pt doing good but she is having issues with bills and her son that lives with her only wants to give 40 dollars to the power bill and pt can't afford it. I will send a social work request

## 2022-01-02 NOTE — Patient Instructions (Signed)
[facility] AT [place]   Discharge Instructions: Thank you for choosing [facility] to provide your oncology and hematology care.  If you have a lab appointment with the [facility], please go directly to the [facility] and check in at the registration area.   Wear comfortable clothing and clothing appropriate for easy access to any Portacath or PICC line.   We strive to give you quality time with your provider. You may need to reschedule your appointment if you arrive late (15 or more minutes).  Arriving late affects you and other patients whose appointments are after yours.  Also, if you miss three or more appointments without notifying the office, you may be dismissed from the clinic at the providers discretion.      For prescription refill requests, have your pharmacy contact our office and allow 72 hours for refills to be completed.    Today you received the following chemotherapy and/or immunotherapy agents: Gemzar and Carboplatin.      To help prevent nausea and vomiting after your treatment, we encourage you to take your nausea medication as directed.  BELOW ARE SYMPTOMS THAT SHOULD BE REPORTED IMMEDIATELY: *FEVER GREATER THAN 100.4 F (38 C) OR HIGHER *CHILLS OR SWEATING *NAUSEA AND VOMITING THAT IS NOT CONTROLLED WITH YOUR NAUSEA MEDICATION *UNUSUAL SHORTNESS OF BREATH *UNUSUAL BRUISING OR BLEEDING *URINARY PROBLEMS (pain or burning when urinating, or frequent urination) *BOWEL PROBLEMS (unusual diarrhea, constipation, pain near the anus) TENDERNESS IN MOUTH AND THROAT WITH OR WITHOUT PRESENCE OF ULCERS (sore throat, sores in mouth, or a toothache) UNUSUAL RASH, SWELLING OR PAIN  UNUSUAL VAGINAL DISCHARGE OR ITCHING   Items with * indicate a potential emergency and should be followed up as soon as possible or go to the Emergency Department if any problems should occur.  Please show the CHEMOTHERAPY ALERT CARD or IMMUNOTHERAPY ALERT  CARD at check-in to the Emergency Department and triage nurse.  Should you have questions after your visit or need to cancel or reschedule your appointment, please contact [facility]  Dept: [phone]  and follow the prompts.  Office hours are 8:00 a.m. to 4:30 p.m. Monday - Friday. Please note that voicemails left after 4:00 p.m. may not be returned until the following business day.  We are closed weekends and major holidays. You have access to a nurse at all times for urgent questions. Please call the main number to the clinic Dept: [phone] and follow the prompts.  For any non-urgent questions, you may also contact your provider using MyChart. We now offer e-Visits for anyone 60 and older to request care online for non-urgent symptoms. For details visit [email or web address].   Also download the MyChart app! Go to the app store, search "MyChart", open the app, select [facility], and log in with your MyChart username and password.  Due to Covid, a mask is required upon entering the hospital/clinic. If you do not have a mask, one will be given to you upon arrival. For doctor visits, patients may have 1 support person aged 77 or older with them. For treatment visits, patients cannot have anyone with them due to current Covid guidelines and our immunocompromised population.

## 2022-01-03 LAB — CANCER ANTIGEN 19-9: CA 19-9: <2 U/mL (ref 0–35)

## 2022-01-05 ENCOUNTER — Encounter: Payer: Self-pay | Admitting: Licensed Clinical Social Worker

## 2022-01-05 NOTE — Progress Notes (Signed)
[facility] Work  Clinical Social Work was referred by [facility] was referred by Dr. Janese Banks for assessment of psychosocial needs.  Clinical Social Worker contacted patient by phone  to offer support and assess for needs.  Patient's cell phone does not have voicemail set up therefore CSW ws unable to leave voicemail.  Patient's home phone states "subscriber not in service".   Janice Short, [place]   First Attempt

## 2022-01-06 NOTE — Progress Notes (Signed)
[place] add it to the next treatment and will work to get the other 4 added on and let pt know

## 2022-01-07 ENCOUNTER — Encounter: Payer: Self-pay | Admitting: Oncology

## 2022-01-09 ENCOUNTER — Inpatient Hospital Stay: Payer: Medicare Other

## 2022-01-09 ENCOUNTER — Encounter: Payer: Self-pay | Admitting: Oncology

## 2022-01-09 ENCOUNTER — Other Ambulatory Visit: Payer: Medicare HMO

## 2022-01-09 ENCOUNTER — Ambulatory Visit: Payer: Medicare HMO

## 2022-01-12 ENCOUNTER — Encounter: Payer: Self-pay | Admitting: Licensed Clinical Social Worker

## 2022-01-12 NOTE — Progress Notes (Signed)
[facility]  Initial Assessment   SHEERA ILLINGWORTH is a 61 y.o. year old female contacted by phone. Clinical Social Work was referred by [employer] for assessment of psychosocial needs.   SDOH (Social Determinants of Health) assessments performed: No   Distress Screen completed: No No flowsheet data found.    Family/Social Information:  Housing Arrangement: patient lives alonebut daughter Mendel Ryder stays with her regularly. Family members/support persons in your life? Family Transportation concerns: no  Employment: Disabled. Income source: Banker concerns: Yes, current concerns Type of concern: Utilities Food access concerns: no Religious or spiritual practice: N/A Services Currently in place:  Patient has Medicare and Medicaid  Coping/ Adjustment to diagnosis: Patient understands treatment plan and what happens next? yes Concerns about diagnosis and/or treatment:  Patient is not concerned about diagnosis or treatment, only mentioned concern about utilities. Patient reported stressors: Veterinary surgeon and priorities: N/A Patient enjoys  N/A Current coping skills/ strengths: Ability for insight , Capable of independent living , and Supportive family/friends     SUMMARY: Current SDOH Barriers:  Financial constraints related to lack of finances to pay for utilities, currently the patient's electricity bill, Medication procurement, and delay in receiving [facility] medicare card, and states she has to wait to receive the [facility] card before she can get her Albuterol.  Clinical Social Work Clinical Goal(s):  N/A  Interventions: Discussed common feeling and emotions when being diagnosed with cancer, and the importance of support during treatment Informed patient of the support team roles and support services at [facility] Provided [place] contact information and encouraged patient to call with any questions or concerns Referred patient to Juliann Pulse for financial  assistance and Provided education regarding CSW prole in patient care and community resources.   Follow Up Plan: Patient will contact CSW with any support or resource needs and Patient will refer patient to Ulice Dash. Patient will contact DSS about emergency assistance toward utility bills. Patient verbalizes understanding of plan: Yes    [employer] , LCSW

## 2022-01-16 ENCOUNTER — Inpatient Hospital Stay: Payer: Medicare Other

## 2022-01-16 ENCOUNTER — Other Ambulatory Visit: Payer: Self-pay

## 2022-01-16 ENCOUNTER — Ambulatory Visit: Payer: Medicare HMO

## 2022-01-16 ENCOUNTER — Inpatient Hospital Stay (HOSPITAL_BASED_OUTPATIENT_CLINIC_OR_DEPARTMENT_OTHER): Payer: Medicare Other | Admitting: Oncology

## 2022-01-16 ENCOUNTER — Encounter: Payer: Self-pay | Admitting: Oncology

## 2022-01-16 ENCOUNTER — Inpatient Hospital Stay: Payer: Medicare Other | Attending: Oncology

## 2022-01-16 VITALS — BP 124/69 | HR 75

## 2022-01-16 VITALS — BP 131/61 | HR 68 | Temp 96.8°F | Resp 16 | Wt 161.0 lb

## 2022-01-16 DIAGNOSIS — C221 Intrahepatic bile duct carcinoma: Secondary | ICD-10-CM | POA: Insufficient documentation

## 2022-01-16 DIAGNOSIS — E538 Deficiency of other specified B group vitamins: Secondary | ICD-10-CM

## 2022-01-16 DIAGNOSIS — D6481 Anemia due to antineoplastic chemotherapy: Secondary | ICD-10-CM | POA: Diagnosis not present

## 2022-01-16 DIAGNOSIS — Z5111 Encounter for antineoplastic chemotherapy: Secondary | ICD-10-CM

## 2022-01-16 DIAGNOSIS — T451X5A Adverse effect of antineoplastic and immunosuppressive drugs, initial encounter: Secondary | ICD-10-CM

## 2022-01-16 DIAGNOSIS — D509 Iron deficiency anemia, unspecified: Secondary | ICD-10-CM | POA: Insufficient documentation

## 2022-01-16 LAB — COMPREHENSIVE METABOLIC PANEL
ALT: 20 U/L (ref 0–44)
AST: 27 U/L (ref 15–41)
Albumin: 2.8 g/dL — ABNORMAL LOW (ref 3.5–5.0)
Alkaline Phosphatase: 286 U/L — ABNORMAL HIGH (ref 38–126)
Anion gap: 4 — ABNORMAL LOW (ref 5–15)
BUN: 12 mg/dL (ref 8–23)
CO2: 20 mmol/L — ABNORMAL LOW (ref 22–32)
Calcium: 8.1 mg/dL — ABNORMAL LOW (ref 8.9–10.3)
Chloride: 112 mmol/L — ABNORMAL HIGH (ref 98–111)
Creatinine, Ser: 1.1 mg/dL — ABNORMAL HIGH (ref 0.44–1.00)
GFR, Estimated: 57 mL/min — ABNORMAL LOW (ref >60)
Glucose, Bld: 212 mg/dL — ABNORMAL HIGH (ref 70–99)
Potassium: 4.9 mmol/L (ref 3.5–5.1)
Sodium: 136 mmol/L (ref 135–145)
Total Bilirubin: 0.6 mg/dL (ref 0.3–1.2)
Total Protein: 7.5 g/dL (ref 6.5–8.1)

## 2022-01-16 LAB — CBC WITH DIFFERENTIAL/PLATELET
Abs Immature Granulocytes: 0.02 10*3/uL (ref 0.00–0.07)
Basophils Absolute: 0.1 10*3/uL (ref 0.0–0.1)
Basophils Relative: 1 %
Eosinophils Absolute: 0.2 10*3/uL (ref 0.0–0.5)
Eosinophils Relative: 3 %
HCT: 24.2 % — ABNORMAL LOW (ref 36.0–46.0)
Hemoglobin: 7.4 g/dL — ABNORMAL LOW (ref 12.0–15.0)
Immature Granulocytes: 0 %
Lymphocytes Relative: 24 %
Lymphs Abs: 1.7 10*3/uL (ref 0.7–4.0)
MCH: 30 pg (ref 26.0–34.0)
MCHC: 30.6 g/dL (ref 30.0–36.0)
MCV: 98 fL (ref 80.0–100.0)
Monocytes Absolute: 1.1 10*3/uL — ABNORMAL HIGH (ref 0.1–1.0)
Monocytes Relative: 15 %
Neutro Abs: 4.1 10*3/uL (ref 1.7–7.7)
Neutrophils Relative %: 57 %
Platelets: 124 10*3/uL — ABNORMAL LOW (ref 150–400)
RBC: 2.47 MIL/uL — ABNORMAL LOW (ref 3.87–5.11)
RDW: 21.3 % — ABNORMAL HIGH (ref 11.5–15.5)
Smear Review: NORMAL
WBC: 7.1 10*3/uL (ref 4.0–10.5)
nRBC: 0 % (ref 0.0–0.2)

## 2022-01-16 LAB — MAGNESIUM: Magnesium: 2 mg/dL (ref 1.7–2.4)

## 2022-01-16 MED ORDER — SODIUM CHLORIDE 0.9 % IV SOLN
Freq: Once | INTRAVENOUS | Status: AC
  Filled 2022-01-16: qty 250

## 2022-01-16 MED ORDER — SODIUM CHLORIDE 0.9 % IV SOLN
130.0000 mg | Freq: Once | INTRAVENOUS | Status: AC
  Administered 2022-01-16: 130 mg via INTRAVENOUS
  Filled 2022-01-16: qty 13

## 2022-01-16 MED ORDER — SODIUM CHLORIDE 0.9 % IV SOLN
1400.0000 mg | Freq: Once | INTRAVENOUS | Status: AC
  Administered 2022-01-16: 1400 mg via INTRAVENOUS
  Filled 2022-01-16: qty 26.3

## 2022-01-16 MED ORDER — SODIUM CHLORIDE 0.9 % IV SOLN: 200.0000 mg | INTRAVENOUS | Status: DC

## 2022-01-16 MED ORDER — SODIUM CHLORIDE 0.9 % IV SOLN
10.0000 mg | Freq: Once | INTRAVENOUS | Status: AC
  Administered 2022-01-16: 10 mg via INTRAVENOUS
  Filled 2022-01-16: qty 10

## 2022-01-16 MED ORDER — HEPARIN SOD (PORK) LOCK FLUSH 100 UNIT/ML IV SOLN
500.0000 [IU] | Freq: Once | INTRAVENOUS | Status: AC | PRN
  Administered 2022-01-16: 500 [IU]
  Filled 2022-01-16: qty 5

## 2022-01-16 MED ORDER — PALONOSETRON HCL INJECTION 0.25 MG/5ML
0.2500 mg | Freq: Once | INTRAVENOUS | Status: AC
  Administered 2022-01-16: 0.25 mg via INTRAVENOUS

## 2022-01-16 MED ORDER — IRON SUCROSE 20 MG/ML IV SOLN
200.0000 mg | Freq: Once | INTRAVENOUS | Status: AC
  Administered 2022-01-16: 200 mg via INTRAVENOUS
  Filled 2022-01-16: qty 10

## 2022-01-16 NOTE — Progress Notes (Signed)
Per Dr. Janese Banks, all labs reviewed and ok to proceed with gemzar, carboplatin and venofer today.

## 2022-01-16 NOTE — Progress Notes (Signed)
Hematology/Oncology Consult note [facility]  Telephone:([phone] [phone]  Patient Care Team: Steele Sizer, MD as PCP - General (Family Medicine) Clent Jacks, RN as Oncology Nurse Navigator [place], [place], LCSW as Social Worker Germaine Pomfret, [facility] (Pharmacist)   Name of the patient: Janice Short  [id number]  1961/07/01   Date of visit: 01/16/22  Diagnosis- metastatic intrahepatic cholangiocarcinoma  Chief complaint/ Reason for visit- on treatment assessment prior to cycle 8 day 15 of gemcitabine carboplatin chemotherapy  Heme/Onc history:  Patient is a 61 year old African-American female with a past medical history significant for hypertension hyperlipidemia and type 2 diabetes who presented to the ER with symptoms of cramping abdominal pain and nausea.  This was followed by right upper quadrant ultrasound which showed multiple echogenic masses in the liver with nodular contour of the liver possibly suggestive of cirrhosis.  Several nodular masses in the vicinity of the pancreatic head and porta hepatic lymph nodes.  This was followed by an MRI abdomen and MRCP.  It showed right hepatic lobe masslike area measuring 4.9 x 2.9 cm.  Diffusion abnormality in the left hepatic lobe with mild left-sided biliary ductal dilatation and another area of abnormality measuring 1.4 x 1 cm.  Bulky celiac adenopathy 3 x 2.1 cm tracking into gastrohepatic recess.  Suspected portal venous invasion with convex protrusion in the right portal vein.  Left adrenal lesion measuring 2.9 x 1.5 cm.  Findings concerning for cholangiocarcinoma or biphenotypic cholangiole hepatocellular neoplasm.     Patient underwent CT-guided liver biopsy Which was consistent with adenocarcinoma poorly differentiated.  Immunohistochemistry showed CK7 positivity, CDX2 negative, Heppar negative, GATA3 negative.  CK19 positive.  Differentials include cholangiocarcinoma, pancreatic, upper  GI.  Lung and breast less likely.  However given CK19 positivity most compatible with cholangiocarcinoma.   Patient's case was discussed at tumor board and it was felt as if the lymphadenopathy was more like a confluent primary tumor mass.  Patient was referred to [facility] for second opinion to see if she would be a candidate for surgery.However upon their review of the PET scan it was deemed that this was indeed periportal adenopathy.  They also did a CT chest abdomen and pelvis with MIPS protocol and she was also found to have portocaval lymph node measuring 2.4 cm which was more consistent with metastatic disease.  She was deemed unresectable   NGS testing showed BRCA2, FGFR2, PIK3CA, RAD 54L, CDC73, CDKN2 A/B CR EBBP rearrangement exon 31    Patient has been getting gemcitabine cisplatin chemotherapy 2 weeks on 1 week off since August 2022  Interval history- she is tolerating treatment well so far. Denies any significant nausea/ vomiting. Denies any abdominal pain. Has ongoing fatigue  ECOG PS- 1 Pain scale- 0   Review of systems- Review of Systems  Constitutional:  Positive for malaise/fatigue. Negative for chills, fever and weight loss.  HENT:  Negative for congestion, ear discharge and nosebleeds.   Eyes:  Negative for blurred vision.  Respiratory:  Negative for cough, hemoptysis, sputum production, shortness of breath and wheezing.   Cardiovascular:  Negative for chest pain, palpitations, orthopnea and claudication.  Gastrointestinal:  Negative for abdominal pain, blood in stool, constipation, diarrhea, heartburn, melena, nausea and vomiting.  Genitourinary:  Negative for dysuria, flank pain, frequency, hematuria and urgency.  Musculoskeletal:  Negative for back pain, joint pain and myalgias.  Skin:  Negative for rash.  Neurological:  Negative for dizziness, tingling, focal weakness, seizures, weakness and  headaches.  Endo/Heme/Allergies:  Does not bruise/bleed easily.   Psychiatric/Behavioral:  Negative for depression and suicidal ideas. The patient does not have insomnia.       Allergies  Allergen Reactions   Other Shortness Of Breath    Cat dander and grass pollen   Cisplatin Itching   Bydureon [Exenatide] Other (See Comments)    Pain with injection not allergy     Past Medical History:  Diagnosis Date   Allergy    Asthma    Carpal tunnel syndrome on left    Cervical radiculitis    Chronic osteoarthritis    Corns and callosity    Depression    Dermatophytosis of foot    Diabetes mellitus without complication (HCC)    Gout    Hyperlipidemia    Hypertension    Impingement syndrome of left shoulder    Intrahepatic cholangiocarcinoma (HCC)    Lumbosacral neuritis    Microalbuminuria    Obesity    Supraventricular tachycardia ([place]) 07/01/2015   SVT (supraventricular tachycardia) (HCC)    Tenosynovitis of wrist    Vitamin D deficiency      Past Surgical History:  Procedure Laterality Date   ABDOMINAL HYSTERECTOMY     COLONOSCOPY WITH PROPOFOL N/A 05/06/2021   Procedure: COLONOSCOPY WITH PROPOFOL;  Surgeon: Lucilla Lame, MD;  Location: [facility] ENDOSCOPY;  Service: Endoscopy;  Laterality: N/A;   ESOPHAGOGASTRODUODENOSCOPY (EGD) WITH PROPOFOL N/A 05/06/2021   Procedure: ESOPHAGOGASTRODUODENOSCOPY (EGD) WITH PROPOFOL;  Surgeon: Lucilla Lame, MD;  Location: [facility] ENDOSCOPY;  Service: Endoscopy;  Laterality: N/A;   PORTA CATH INSERTION N/A 07/17/2021   Procedure: PORTA CATH INSERTION;  Surgeon: Algernon Huxley, MD;  Location: [place] CV LAB;  Service: Cardiovascular;  Laterality: N/A;    Social History   Socioeconomic History   Marital status: Single    Spouse name: Not on file   Number of children: 2   Years of education: Not on file   Highest education level: Not on file  Occupational History   Occupation: disable     Comment: from chronic back pain, 2019  Tobacco Use   Smoking status: Some Days    Types: Cigarettes   Smokeless  tobacco: Never   Tobacco comments:    1 per day 01/30/21 does not inhale it  Vaping Use   Vaping Use: Never used  Substance and Sexual Activity   Alcohol use: Not Currently   Drug use: No   Sexual activity: Not Currently  Other Topics Concern   Not on file  Social History Narrative   She is on disability for chronic back pain. Pt's son lives with her   Social Determinants of Health   Financial Resource Strain: Medium Risk   Difficulty of Paying Living Expenses: Somewhat hard  Food Insecurity: No Food Insecurity   Worried About Charity fundraiser in the Last Year: Never true   Arboriculturist in the Last Year: Never true  Transportation Needs: No Transportation Needs   Lack of Transportation (Medical): No   Lack of Transportation (Non-Medical): No  Physical Activity: Inactive   Days of Exercise per Week: 0 days   Minutes of Exercise per Session: 0 min  Stress: Stress Concern Present   Feeling of Stress : To some extent  Social Connections: Moderately Isolated   Frequency of Communication with Friends and Family: More than three times a week   Frequency of Social Gatherings with Friends and Family: Three times a week   Attends Religious  Services: More than 4 times per year   Active Member of Clubs or Organizations: No   Attends Archivist Meetings: Never   Marital Status: Never married  Human resources officer Violence: Not At Risk   Fear of Current or Ex-Partner: No   Emotionally Abused: No   Physically Abused: No   Sexually Abused: No    Family History  Problem Relation Age of Onset   Heart attack Mother    CAD Mother    Kidney disease Father      Current Outpatient Medications:    albuterol (VENTOLIN HFA) 108 (90 Base) MCG/ACT inhaler, INHALE 2 PUFFS BY MOUTH EVERY 6 HOURS AS NEEDED FOR WHEEZING AND FOR SHORTNESS OF BREATH, Disp: 18 g, Rfl: 1   aspirin 81 MG tablet, Take 1 tablet by mouth daily., Disp: , Rfl:    atorvastatin (LIPITOR) 40 MG tablet, Take 1  tablet (40 mg total) by mouth at bedtime., Disp: 90 tablet, Rfl: 1   blood glucose meter kit and supplies, Dispense based on patient and insurance preference. Use up to four times daily as directed. (FOR ICD-10 E10.9, E11.9)., Disp: 1 each, Rfl: 0   diphenhydramine-acetaminophen (TYLENOL PM) 25-500 MG TABS tablet, Take 1 tablet by mouth at bedtime as needed., Disp: , Rfl:    escitalopram (LEXAPRO) 20 MG tablet, Take 1 tablet (20 mg total) by mouth daily., Disp: 90 tablet, Rfl: 1   fluticasone (FLONASE) 50 MCG/ACT nasal spray, USE 2 SPRAY(S) IN EACH NOSTRIL AT BEDTIME (Patient taking differently: Place 2 sprays into both nostrils daily as needed. USE 2 SPRAY(S) IN EACH NOSTRIL AT BEDTIME), Disp: 16 g, Rfl: 0   Fluticasone-Umeclidin-Vilant (TRELEGY ELLIPTA) 100-62.5-25 MCG/INH AEPB, Take 1 puff by mouth daily., Disp: 180 each, Rfl: 1   folic acid (FOLVITE) 1 MG tablet, Take 1 tablet (1 mg total) by mouth daily., Disp: 30 tablet, Rfl: 3   gabapentin (NEURONTIN) 300 MG capsule, Take 2 capsules by mouth twice daily, Disp: 360 capsule, Rfl: 0   Insulin Pen Needle 31G X 8 MM MISC, 1 each by Does not apply route daily., Disp: 100 each, Rfl: 1   TRESIBA FLEXTOUCH 100 UNIT/ML FlexTouch Pen, INJECT 12-30 UNITS OF INSULIN INTO THE SKIN DAILY TO KEEP FASTING GLUCOSE BETWEEN 100-140, Disp: 15 mL, Rfl: 0  Physical exam:  Vitals:   01/16/22 0919  BP: 131/61  Pulse: 68  Resp: 16  Temp: (!) 96.8 F (36 C)  TempSrc: Tympanic  SpO2: 100%  Weight: 161 lb (73 kg)   Physical Exam Cardiovascular:     Rate and Rhythm: Normal rate and regular rhythm.     Heart sounds: Normal heart sounds.  Pulmonary:     Effort: Pulmonary effort is normal.     Breath sounds: Normal breath sounds.  Abdominal:     General: Bowel sounds are normal.     Palpations: Abdomen is soft.  Skin:    General: Skin is warm and dry.  Neurological:     Mental Status: She is alert and oriented to person, place, and time.     CMP Latest  Ref Rng & Units 01/16/2022  Glucose 70 - 99 mg/dL 212(H)  BUN 8 - 23 mg/dL 12  Creatinine 0.44 - 1.00 mg/dL 1.10(H)  Sodium 135 - 145 mmol/L 136  Potassium 3.5 - 5.1 mmol/L 4.9  Chloride 98 - 111 mmol/L 112(H)  CO2 22 - 32 mmol/L 20(L)  Calcium 8.9 - 10.3 mg/dL 8.1(L)  Total Protein 6.5 - 8.1 g/dL  7.5  Total Bilirubin 0.3 - 1.2 mg/dL 0.6  Alkaline Phos 38 - 126 U/L 286(H)  AST 15 - 41 U/L 27  ALT 0 - 44 U/L 20   CBC Latest Ref Rng & Units 01/16/2022  WBC 4.0 - 10.5 K/uL 7.1  Hemoglobin 12.0 - 15.0 g/dL 7.4(L)  Hematocrit 36.0 - 46.0 % 24.2(L)  Platelets 150 - 400 K/uL 124(L)      Assessment and plan- Patient is a 61 y.o. female with stage 4 intrahepatic cholangiocarcinoma here for on treatment assessment prior to cycle 8 day 15 of gemcitabine carboplatin chemotherapy  Counts ok to proceed with cycle 8 day 15 of gemcitabine carboplatin chemotherapy today. No udenyca. I will see her back in 2 weeks for cycle 8 day 15  Chemo induced anemia- stable around 7. On folate supplementatin for low folic acid. There was also a component of iron deficieny and she will receive dose 1 of venofer today. Dose 2 in 2 weeks. Continue to monitor   Visit Diagnosis 1. Encounter for antineoplastic chemotherapy   2. Antineoplastic chemotherapy induced anemia   3. Intrahepatic cholangiocarcinoma ([place])   4. Iron deficiency anemia, unspecified iron deficiency anemia type   5. Folate deficiency      Dr. Randa Evens, MD, MPH [facility] at [facility] [phone] 01/16/2022 1:51 PM

## 2022-01-16 NOTE — Progress Notes (Signed)
Pt in for follow up reports had a change in prescribe med insurance but unsure of pharmacy information.  It will be effective 01/25/22.

## 2022-01-16 NOTE — Progress Notes (Signed)
Nutrition Follow-up:  Patient with stage IV intrahepatic cholangiocarcinoma.  Patient receiving carboplatin, gemcitabine.    Met with patient today in infusion.  Patient reports that she has a good appetite.  Denies nutrition impact symptoms.  Breakfast she sometimes gets a biscuit.  Lunch she has been eating shrimp rolls recently.  "I have gone through 4 boxes of those."  Likes pecan twirls and devils foods squares.      Medications: reviewed  Labs: reviewed  Anthropometrics:   Weight 161 lb  155 lb 12.8 oz on 12/22  NUTRITION DIAGNOSIS: Inadequate oral intake improved   INTERVENTION:  Encouraged patient to include plant foods in diet with good appetite along with good sources of protein.   No questions or concerns at this time    MONITORING, EVALUATION, GOAL: weight trends, intake   NEXT VISIT: as needed  Celise Bazar B. Zenia Resides, [place], [place] Registered Dietitian [phone] (mobile)

## 2022-01-17 ENCOUNTER — Encounter: Payer: Self-pay | Admitting: Oncology

## 2022-01-19 ENCOUNTER — Other Ambulatory Visit: Payer: Self-pay

## 2022-01-19 DIAGNOSIS — G8929 Other chronic pain: Secondary | ICD-10-CM

## 2022-01-19 DIAGNOSIS — E1142 Type 2 diabetes mellitus with diabetic polyneuropathy: Secondary | ICD-10-CM

## 2022-01-19 DIAGNOSIS — J449 Chronic obstructive pulmonary disease, unspecified: Secondary | ICD-10-CM

## 2022-01-19 DIAGNOSIS — M4807 Spinal stenosis, lumbosacral region: Secondary | ICD-10-CM

## 2022-01-19 DIAGNOSIS — F331 Major depressive disorder, recurrent, moderate: Secondary | ICD-10-CM

## 2022-01-19 DIAGNOSIS — M5440 Lumbago with sciatica, unspecified side: Secondary | ICD-10-CM

## 2022-01-19 DIAGNOSIS — E118 Type 2 diabetes mellitus with unspecified complications: Secondary | ICD-10-CM

## 2022-01-19 MED ORDER — GABAPENTIN 300 MG PO CAPS: 600.0000 mg | ORAL_CAPSULE | Freq: Two times a day (BID) | ORAL | 0 refills | Status: DC

## 2022-01-19 MED ORDER — ESCITALOPRAM OXALATE 20 MG PO TABS: 20.0000 mg | ORAL_TABLET | Freq: Every day | ORAL | 0 refills | Status: DC

## 2022-01-19 MED ORDER — TRESIBA FLEXTOUCH 100 UNIT/ML ~~LOC~~ SOPN: PEN_INJECTOR | SUBCUTANEOUS | 0 refills | Status: DC

## 2022-01-19 MED ORDER — ALBUTEROL SULFATE HFA 108 (90 BASE) MCG/ACT IN AERS: INHALATION_SPRAY | RESPIRATORY_TRACT | 0 refills | Status: DC

## 2022-01-27 ENCOUNTER — Encounter: Payer: Self-pay | Admitting: Oncology

## 2022-01-30 ENCOUNTER — Encounter: Payer: Self-pay | Admitting: Oncology

## 2022-01-30 ENCOUNTER — Other Ambulatory Visit: Payer: Self-pay

## 2022-01-30 ENCOUNTER — Inpatient Hospital Stay (HOSPITAL_BASED_OUTPATIENT_CLINIC_OR_DEPARTMENT_OTHER): Payer: Medicare Other | Admitting: Oncology

## 2022-01-30 ENCOUNTER — Inpatient Hospital Stay: Payer: Medicare Other

## 2022-01-30 VITALS — BP 132/59 | HR 85 | Temp 97.8°F | Resp 20 | Wt 161.2 lb

## 2022-01-30 DIAGNOSIS — C221 Intrahepatic bile duct carcinoma: Secondary | ICD-10-CM | POA: Diagnosis not present

## 2022-01-30 DIAGNOSIS — T451X5A Adverse effect of antineoplastic and immunosuppressive drugs, initial encounter: Secondary | ICD-10-CM

## 2022-01-30 DIAGNOSIS — Z5111 Encounter for antineoplastic chemotherapy: Secondary | ICD-10-CM | POA: Diagnosis not present

## 2022-01-30 DIAGNOSIS — D6481 Anemia due to antineoplastic chemotherapy: Secondary | ICD-10-CM | POA: Diagnosis not present

## 2022-01-30 DIAGNOSIS — I10 Essential (primary) hypertension: Secondary | ICD-10-CM | POA: Diagnosis not present

## 2022-01-30 DIAGNOSIS — F17210 Nicotine dependence, cigarettes, uncomplicated: Secondary | ICD-10-CM

## 2022-01-30 DIAGNOSIS — Z90710 Acquired absence of both cervix and uterus: Secondary | ICD-10-CM

## 2022-01-30 DIAGNOSIS — E119 Type 2 diabetes mellitus without complications: Secondary | ICD-10-CM

## 2022-01-30 LAB — COMPREHENSIVE METABOLIC PANEL
ALT: 17 U/L (ref 0–44)
AST: 23 U/L (ref 15–41)
Albumin: 2.9 g/dL — ABNORMAL LOW (ref 3.5–5.0)
Alkaline Phosphatase: 240 U/L — ABNORMAL HIGH (ref 38–126)
Anion gap: 3 — ABNORMAL LOW (ref 5–15)
BUN: 13 mg/dL (ref 8–23)
CO2: 22 mmol/L (ref 22–32)
Calcium: 8.3 mg/dL — ABNORMAL LOW (ref 8.9–10.3)
Chloride: 111 mmol/L (ref 98–111)
Creatinine, Ser: 1.08 mg/dL — ABNORMAL HIGH (ref 0.44–1.00)
GFR, Estimated: 58 mL/min — ABNORMAL LOW (ref >60)
Glucose, Bld: 85 mg/dL (ref 70–99)
Potassium: 5.3 mmol/L — ABNORMAL HIGH (ref 3.5–5.1)
Sodium: 136 mmol/L (ref 135–145)
Total Bilirubin: 0.3 mg/dL (ref 0.3–1.2)
Total Protein: 7.8 g/dL (ref 6.5–8.1)

## 2022-01-30 LAB — CBC WITH DIFFERENTIAL/PLATELET
Abs Immature Granulocytes: 0.02 10*3/uL (ref 0.00–0.07)
Basophils Absolute: 0.1 10*3/uL (ref 0.0–0.1)
Basophils Relative: 1 %
Eosinophils Absolute: 0.3 10*3/uL (ref 0.0–0.5)
Eosinophils Relative: 4 %
HCT: 27.2 % — ABNORMAL LOW (ref 36.0–46.0)
Hemoglobin: 8.3 g/dL — ABNORMAL LOW (ref 12.0–15.0)
Immature Granulocytes: 0 %
Lymphocytes Relative: 24 %
Lymphs Abs: 1.8 10*3/uL (ref 0.7–4.0)
MCH: 29.4 pg (ref 26.0–34.0)
MCHC: 30.5 g/dL (ref 30.0–36.0)
MCV: 96.5 fL (ref 80.0–100.0)
Monocytes Absolute: 1.2 10*3/uL — ABNORMAL HIGH (ref 0.1–1.0)
Monocytes Relative: 16 %
Neutro Abs: 4.1 10*3/uL (ref 1.7–7.7)
Neutrophils Relative %: 55 %
Platelets: 191 10*3/uL (ref 150–400)
RBC: 2.82 MIL/uL — ABNORMAL LOW (ref 3.87–5.11)
RDW: 21.2 % — ABNORMAL HIGH (ref 11.5–15.5)
WBC: 7.4 10*3/uL (ref 4.0–10.5)
nRBC: 0 % (ref 0.0–0.2)

## 2022-01-30 LAB — MAGNESIUM: Magnesium: 2.2 mg/dL (ref 1.7–2.4)

## 2022-01-30 MED ORDER — IRON SUCROSE 20 MG/ML IV SOLN
200.0000 mg | Freq: Once | INTRAVENOUS | Status: AC
  Administered 2022-01-30: 200 mg via INTRAVENOUS
  Filled 2022-01-30: qty 10

## 2022-01-30 MED ORDER — SODIUM CHLORIDE 0.9 % IV SOLN
130.0000 mg | Freq: Once | INTRAVENOUS | Status: AC
  Administered 2022-01-30: 130 mg via INTRAVENOUS
  Filled 2022-01-30: qty 13

## 2022-01-30 MED ORDER — PALONOSETRON HCL INJECTION 0.25 MG/5ML
0.2500 mg | Freq: Once | INTRAVENOUS | Status: AC
  Administered 2022-01-30: 0.25 mg via INTRAVENOUS

## 2022-01-30 MED ORDER — HEPARIN SOD (PORK) LOCK FLUSH 100 UNIT/ML IV SOLN
500.0000 [IU] | Freq: Once | INTRAVENOUS | Status: AC | PRN
  Administered 2022-01-30: 500 [IU]
  Filled 2022-01-30: qty 5

## 2022-01-30 MED ORDER — SODIUM CHLORIDE 0.9 % IV SOLN
1400.0000 mg | Freq: Once | INTRAVENOUS | Status: AC
  Administered 2022-01-30: 1400 mg via INTRAVENOUS
  Filled 2022-01-30: qty 26.3

## 2022-01-30 MED ORDER — LIDOCAINE-PRILOCAINE 2.5-2.5 % EX CREA: 1.0000 "application " | TOPICAL_CREAM | CUTANEOUS | 3 refills | Status: DC | PRN

## 2022-01-30 MED ORDER — SODIUM CHLORIDE 0.9 % IV SOLN: 200.0000 mg | INTRAVENOUS | Status: DC

## 2022-01-30 MED ORDER — SODIUM CHLORIDE 0.9 % IV SOLN
Freq: Once | INTRAVENOUS | Status: AC
  Filled 2022-01-30: qty 250

## 2022-01-30 MED ORDER — SODIUM CHLORIDE 0.9 % IV SOLN
10.0000 mg | Freq: Once | INTRAVENOUS | Status: AC
  Administered 2022-01-30: 10 mg via INTRAVENOUS
  Filled 2022-01-30: qty 10

## 2022-01-30 MED ORDER — HEPARIN SOD (PORK) LOCK FLUSH 100 UNIT/ML IV SOLN
INTRAVENOUS | Status: AC
  Filled 2022-01-30: qty 5

## 2022-01-30 NOTE — Progress Notes (Addendum)
Hematology/Oncology Consult note [facility]  [phone] [phone]  Patient Care Team: Steele Sizer, MD as PCP - General (Family Medicine) Clent Jacks, RN as Oncology Nurse Navigator [place], [place], LCSW as Social Worker Germaine Pomfret, [facility] (Pharmacist)   Name of the patient: Janice Short  [id number]  [birth year]   Date of visit: 01/30/22  Diagnosis- metastatic intrahepatic cholangiocarcinoma  Chief complaint/ Reason for visit-on treatment assessment prior to cycle 9-day 1 of carboplatin gemcitabine chemotherapy  Heme/Onc history: Patient is a 62 year old African-American female with a past medical history significant for hypertension hyperlipidemia and type 2 diabetes who presented to the ER with symptoms of cramping abdominal pain and nausea.  This was followed by right upper quadrant ultrasound which showed multiple echogenic masses in the liver with nodular contour of the liver possibly suggestive of cirrhosis.  Several nodular masses in the vicinity of the pancreatic head and porta hepatic lymph nodes.  This was followed by an MRI abdomen and MRCP.  It showed right hepatic lobe masslike area measuring 4.9 x 2.9 cm.  Diffusion abnormality in the left hepatic lobe with mild left-sided biliary ductal dilatation and another area of abnormality measuring 1.4 x 1 cm.  Bulky celiac adenopathy 3 x 2.1 cm tracking into gastrohepatic recess.  Suspected portal venous invasion with convex protrusion in the right portal vein.  Left adrenal lesion measuring 2.9 x 1.5 cm.  Findings concerning for cholangiocarcinoma or biphenotypic cholangiole hepatocellular neoplasm.     Patient underwent CT-guided liver biopsy Which was consistent with adenocarcinoma poorly differentiated.  Immunohistochemistry showed CK7 positivity, CDX2 negative, Heppar negative, GATA3 negative.  CK19 positive.  Differentials include cholangiocarcinoma, pancreatic, upper  GI.  Lung and breast less likely.  However given CK19 positivity most compatible with cholangiocarcinoma.   Patient's case was discussed at tumor board and it was felt as if the lymphadenopathy was more like a confluent primary tumor mass.  Patient was referred to [facility] for second opinion to see if she would be a candidate for surgery.However upon their review of the PET scan it was deemed that this was indeed periportal adenopathy.  They also did a CT chest abdomen and pelvis with MIPS protocol and she was also found to have portocaval lymph node measuring 2.4 cm which was more consistent with metastatic disease.  She was deemed unresectable   NGS testing showed BRCA2, FGFR2, PIK3CA, RAD 54L, CDC73, CDKN2 A/B CR EBBP rearrangement exon 31    Patient has been getting gemcitabine cisplatin chemotherapy 2 weeks on 1 week off since August 2022  Interval history-tolerating chemotherapy well without any significant side effects.  Denies any nausea or vomiting.  Denies any abdominal pain  ECOG PS- 1 Pain scale- 0   Review of systems- Review of Systems  Constitutional:  Positive for malaise/fatigue. Negative for chills, fever and weight loss.  HENT:  Negative for congestion, ear discharge and nosebleeds.   Eyes:  Negative for blurred vision.  Respiratory:  Negative for cough, hemoptysis, sputum production, shortness of breath and wheezing.   Cardiovascular:  Negative for chest pain, palpitations, orthopnea and claudication.  Gastrointestinal:  Negative for abdominal pain, blood in stool, constipation, diarrhea, heartburn, melena, nausea and vomiting.  Genitourinary:  Negative for dysuria, flank pain, frequency, hematuria and urgency.  Musculoskeletal:  Negative for back pain, joint pain and myalgias.  Skin:  Negative for rash.  Neurological:  Negative for dizziness, tingling, focal weakness, seizures, weakness and headaches.  Endo/Heme/Allergies:  Does not bruise/bleed easily.   Psychiatric/Behavioral:  Negative for depression and suicidal ideas. The patient does not have insomnia.      Allergies  Allergen Reactions   Other Shortness Of Breath    Cat dander and grass pollen   Cisplatin Itching   Bydureon [Exenatide] Other (See Comments)    Pain with injection not allergy     Past Medical History:  Diagnosis Date   Allergy    Asthma    Carpal tunnel syndrome on left    Cervical radiculitis    Chronic osteoarthritis    Corns and callosity    Depression    Dermatophytosis of foot    Diabetes mellitus without complication (HCC)    Gout    Hyperlipidemia    Hypertension    Impingement syndrome of left shoulder    Intrahepatic cholangiocarcinoma (HCC)    Lumbosacral neuritis    Microalbuminuria    Obesity    Supraventricular tachycardia ([place]) 07/01/2015   SVT (supraventricular tachycardia) (HCC)    Tenosynovitis of wrist    Vitamin D deficiency      Past Surgical History:  Procedure Laterality Date   ABDOMINAL HYSTERECTOMY     COLONOSCOPY WITH PROPOFOL N/A 05/06/2021   Procedure: COLONOSCOPY WITH PROPOFOL;  Surgeon: Lucilla Lame, MD;  Location: [facility] ENDOSCOPY;  Service: Endoscopy;  Laterality: N/A;   ESOPHAGOGASTRODUODENOSCOPY (EGD) WITH PROPOFOL N/A 05/06/2021   Procedure: ESOPHAGOGASTRODUODENOSCOPY (EGD) WITH PROPOFOL;  Surgeon: Lucilla Lame, MD;  Location: [facility] ENDOSCOPY;  Service: Endoscopy;  Laterality: N/A;   PORTA CATH INSERTION N/A 07/17/2021   Procedure: PORTA CATH INSERTION;  Surgeon: Algernon Huxley, MD;  Location: [facility];  Service: Cardiovascular;  Laterality: N/A;    Social History   Socioeconomic History   Marital status: Single    Spouse name: Not on file   Number of children: 2   Years of education: Not on file   Highest education level: Not on file  Occupational History   Occupation: disable     Comment: from chronic back pain, 2019  Tobacco Use   Smoking status: Some Days    Types: Cigarettes   Smokeless  tobacco: Never   Tobacco comments:    1 per day 01/30/21 does not inhale it  Vaping Use   Vaping Use: Never used  Substance and Sexual Activity   Alcohol use: Not Currently   Drug use: No   Sexual activity: Not Currently  Other Topics Concern   Not on file  Social History Narrative   She is on disability for chronic back pain. Pt's son lives with her   Social Determinants of Health   Financial Resource Strain: Medium Risk   Difficulty of Paying Living Expenses: Somewhat hard  Food Insecurity: No Food Insecurity   Worried About Charity fundraiser in the Last Year: Never true   Arboriculturist in the Last Year: Never true  Transportation Needs: No Transportation Needs   Lack of Transportation (Medical): No   Lack of Transportation (Non-Medical): No  Physical Activity: Inactive   Days of Exercise per Week: 0 days   Minutes of Exercise per Session: 0 min  Stress: Stress Concern Present   Feeling of Stress : To some extent  Social Connections: Moderately Isolated   Frequency of Communication with Friends and Family: More than three times a week   Frequency of Social Gatherings with Friends and Family: Three times a week   Attends Religious Services: More than 4 times  per year   Active Member of Clubs or Organizations: No   Attends Archivist Meetings: Never   Marital Status: Never married  Human resources officer Violence: Not At Risk   Fear of Current or Ex-Partner: No   Emotionally Abused: No   Physically Abused: No   Sexually Abused: No    Family History  Problem Relation Age of Onset   Heart attack Mother    CAD Mother    Kidney disease Father      Current Outpatient Medications:    albuterol (VENTOLIN HFA) 108 (90 Base) MCG/ACT inhaler, INHALE 2 PUFFS BY MOUTH EVERY 6 HOURS AS NEEDED FOR WHEEZING AND FOR SHORTNESS OF BREATH, Disp: 18 g, Rfl: 0   aspirin 81 MG tablet, Take 1 tablet by mouth daily., Disp: , Rfl:    atorvastatin (LIPITOR) 40 MG tablet, Take 1  tablet (40 mg total) by mouth at bedtime., Disp: 90 tablet, Rfl: 1   blood glucose meter kit and supplies, Dispense based on patient and insurance preference. Use up to four times daily as directed. (FOR ICD-10 E10.9, E11.9)., Disp: 1 each, Rfl: 0   diphenhydramine-acetaminophen (TYLENOL PM) 25-500 MG TABS tablet, Take 1 tablet by mouth at bedtime as needed., Disp: , Rfl:    escitalopram (LEXAPRO) 20 MG tablet, Take 1 tablet (20 mg total) by mouth daily., Disp: 90 tablet, Rfl: 0   fluticasone (FLONASE) 50 MCG/ACT nasal spray, USE 2 SPRAY(S) IN EACH NOSTRIL AT BEDTIME (Patient taking differently: Place 2 sprays into both nostrils daily as needed. USE 2 SPRAY(S) IN EACH NOSTRIL AT BEDTIME), Disp: 16 g, Rfl: 0   Fluticasone-Umeclidin-Vilant (TRELEGY ELLIPTA) 100-62.5-25 MCG/INH AEPB, Take 1 puff by mouth daily., Disp: 180 each, Rfl: 1   folic acid (FOLVITE) 1 MG tablet, Take 1 tablet (1 mg total) by mouth daily., Disp: 30 tablet, Rfl: 3   gabapentin (NEURONTIN) 300 MG capsule, Take 2 capsules (600 mg total) by mouth 2 (two) times daily., Disp: 360 capsule, Rfl: 0   insulin degludec (TRESIBA FLEXTOUCH) 100 UNIT/ML FlexTouch Pen, INJECT 12-30 UNITS OF INSULIN INTO THE SKIN DAILY TO KEEP FASTING GLUCOSE BETWEEN 100-140, Disp: 15 mL, Rfl: 0   Insulin Pen Needle 31G X 8 MM MISC, 1 each by Does not apply route daily., Disp: 100 each, Rfl: 1   lidocaine-prilocaine (EMLA) cream, Apply 1 application topically as needed. Apply to port and cover with saran wrap 1-2 hours prior to port access, Disp: 30 g, Rfl: 3 No current facility-administered medications for this visit.  Facility-Administered Medications Ordered in Other Visits:    heparin lock flush 100 UNIT/ML injection, , , ,   Physical exam:  Vitals:   01/30/22 0936  BP: (!) 132/59  Pulse: 85  Resp: 20  Temp: 97.8 F (36.6 C)  SpO2: 100%  Weight: 161 lb 3.2 oz (73.1 kg)   Physical Exam Constitutional:      General: She is not in acute  distress. Eyes:     Pupils: Pupils are equal, round, and reactive to light.  Cardiovascular:     Rate and Rhythm: Normal rate and regular rhythm.     Heart sounds: Normal heart sounds.  Pulmonary:     Effort: Pulmonary effort is normal.     Breath sounds: Normal breath sounds.  Abdominal:     General: Bowel sounds are normal.     Palpations: Abdomen is soft.  Skin:    General: Skin is warm and dry.  Neurological:  Mental Status: She is alert and oriented to person, place, and time.     CMP Latest Ref Rng & Units 01/30/2022  Glucose 70 - 99 mg/dL 85  BUN 8 - 23 mg/dL 13  Creatinine 0.44 - 1.00 mg/dL 1.08(H)  Sodium 135 - 145 mmol/L 136  Potassium 3.5 - 5.1 mmol/L 5.3(H)  Chloride 98 - 111 mmol/L 111  CO2 22 - 32 mmol/L 22  Calcium 8.9 - 10.3 mg/dL 8.3(L)  Total Protein 6.5 - 8.1 g/dL 7.8  Total Bilirubin 0.3 - 1.2 mg/dL 0.3  Alkaline Phos 38 - 126 U/L 240(H)  AST 15 - 41 U/L 23  ALT 0 - 44 U/L 17   CBC Latest Ref Rng & Units 01/30/2022  WBC 4.0 - 10.5 K/uL 7.4  Hemoglobin 12.0 - 15.0 g/dL 8.3(L)  Hematocrit 36.0 - 46.0 % 27.2(L)  Platelets 150 - 400 K/uL 191     Assessment and plan- Patient is a 61 y.o. female with stage IV intrahepatic cholangiocarcinoma here for on treatment assessment prior to cycle 9-day 1 of gemcitabine carboplatin chemotherapy  Counts okay to proceed with cycle 9-day 1 of gemcitabine carboplatin chemotherapy today.    I will see her back in 2 weeks for cycle 9-day 15 of chemotherapy.  Overall patient is tolerating chemotherapy well and will be due for her repeat scans inMarch 2022.  Chemo induced anemia: Stable between 7-8.  There is a component of iron deficiency as well.  Continue to monitor.  She will receive dose 2 of Venofer today   Visit Diagnosis 1. Encounter for antineoplastic chemotherapy   2. Antineoplastic chemotherapy induced anemia   3. Intrahepatic cholangiocarcinoma ([place])      Dr. Randa Evens, MD, MPH [facility] at [facility] [phone] 01/30/2022 3:52 PM

## 2022-01-30 NOTE — Progress Notes (Signed)
Labs reviewed with MD . Per Dr. Tasia Catchings to proceed with treatment.   Athel Merriweather [employer]

## 2022-01-30 NOTE — Patient Instructions (Signed)
[facility] AT [place]  Discharge Instructions: Thank you for choosing [facility] to provide your oncology and hematology care.  If you have a lab appointment with the [facility], please go directly to the [facility] and check in at the registration area.  Wear comfortable clothing and clothing appropriate for easy access to any Portacath or PICC line.   We strive to give you quality time with your provider. You may need to reschedule your appointment if you arrive late (15 or more minutes).  Arriving late affects you and other patients whose appointments are after yours.  Also, if you miss three or more appointments without notifying the office, you may be dismissed from the clinic at the providers discretion.      For prescription refill requests, have your pharmacy contact our office and allow 72 hours for refills to be completed.    Today you received the following chemotherapy and/or immunotherapy agents gmezar & carboplatin   To help prevent nausea and vomiting after your treatment, we encourage you to take your nausea medication as directed.  BELOW ARE SYMPTOMS THAT SHOULD BE REPORTED IMMEDIATELY: *FEVER GREATER THAN 100.4 F (38 C) OR HIGHER *CHILLS OR SWEATING *NAUSEA AND VOMITING THAT IS NOT CONTROLLED WITH YOUR NAUSEA MEDICATION *UNUSUAL SHORTNESS OF BREATH *UNUSUAL BRUISING OR BLEEDING *URINARY PROBLEMS (pain or burning when urinating, or frequent urination) *BOWEL PROBLEMS (unusual diarrhea, constipation, pain near the anus) TENDERNESS IN MOUTH AND THROAT WITH OR WITHOUT PRESENCE OF ULCERS (sore throat, sores in mouth, or a toothache) UNUSUAL RASH, SWELLING OR PAIN  UNUSUAL VAGINAL DISCHARGE OR ITCHING   Items with * indicate a potential emergency and should be followed up as soon as possible or go to the Emergency Department if any problems should occur.  Please show the CHEMOTHERAPY ALERT CARD or IMMUNOTHERAPY ALERT CARD at  check-in to the Emergency Department and triage nurse.  Should you have questions after your visit or need to cancel or reschedule your appointment, please contact [facility] [place] AT [place]  [phone] and follow the prompts.  Office hours are 8:00 a.m. to 4:30 p.m. Monday - Friday. Please note that voicemails left after 4:00 p.m. may not be returned until the following business day.  We are closed weekends and major holidays. You have access to a nurse at all times for urgent questions. Please call the main number to the clinic [phone] and follow the prompts.  For any non-urgent questions, you may also contact your provider using MyChart. We now offer e-Visits for anyone 70 and older to request care online for non-urgent symptoms. For details visit [email or web address].   Also download the MyChart app! Go to the app store, search "MyChart", open the app, select [place], and log in with your MyChart username and password.  Due to Covid, a mask is required upon entering the hospital/clinic. If you do not have a mask, one will be given to you upon arrival. For doctor visits, patients may have 1 support person aged 61 or older with them. For treatment visits, patients cannot have anyone with them due to current Covid guidelines and our immunocompromised population.

## 2022-02-02 ENCOUNTER — Ambulatory Visit: Payer: Medicare Other

## 2022-02-02 ENCOUNTER — Other Ambulatory Visit: Payer: Self-pay

## 2022-02-03 ENCOUNTER — Ambulatory Visit (INDEPENDENT_AMBULATORY_CARE_PROVIDER_SITE_OTHER): Payer: Medicare Other

## 2022-02-03 DIAGNOSIS — Z0000 Encounter for general adult medical examination without abnormal findings: Secondary | ICD-10-CM | POA: Diagnosis not present

## 2022-02-03 NOTE — Progress Notes (Signed)
Subjective:   Janice Short is a 61 y.o. female who presents for Medicare Annual (Subsequent) preventive examination.  Virtual Visit via Telephone Note  I connected with  Janice Short on 02/03/22 at 10:40 AM EST by telephone and verified that I am speaking with the correct person using two identifiers.  Location: Patient: home Provider: [place] Persons participating in the virtual visit: [place]   I discussed the limitations, risks, security and privacy concerns of performing an evaluation and management service by telephone and the availability of in person appointments. The patient expressed understanding and agreed to proceed.  Interactive audio and video telecommunications were attempted between this nurse and patient, however failed, due to patient having technical difficulties OR patient did not have access to video capability.  We continued and completed visit with audio only.  Some vital signs may be absent or patient reported.   Janice Marker, LPN   Review of Systems     Cardiac Risk Factors include: diabetes mellitus;dyslipidemia;hypertension;smoking/ tobacco exposure     Objective:    There were no vitals filed for this visit. There is no height or weight on file to calculate BMI.  Advanced Directives 02/03/2022 01/30/2022 01/16/2022 01/16/2022 01/02/2022 12/19/2021 12/12/2021  Does Patient Have a Medical Advance Directive? No Yes No Yes Yes No No  Type of Advance Directive - [place];Living will - [place];Living will [place];Living will - -  Does patient want to make changes to medical advance directive? Yes (MAU/Ambulatory/Procedural Areas - Information given) Yes (ED - Information included in AVS) - - No - Patient declined No - Patient declined No - Patient declined  Copy of [place] in Chart? - - - - No - copy requested - -  Would patient like information on creating a  medical advance directive? - Yes (ED - Information included in AVS) - - No - Patient declined - -    Current Medications (verified) Outpatient Encounter Medications as of 02/03/2022  Medication Sig   albuterol (VENTOLIN HFA) 108 (90 Base) MCG/ACT inhaler INHALE 2 PUFFS BY MOUTH EVERY 6 HOURS AS NEEDED FOR WHEEZING AND FOR SHORTNESS OF BREATH   aspirin 81 MG tablet Take 1 tablet by mouth daily.   atorvastatin (LIPITOR) 40 MG tablet Take 1 tablet (40 mg total) by mouth at bedtime.   blood glucose meter kit and supplies Dispense based on patient and insurance preference. Use up to four times daily as directed. (FOR ICD-10 E10.9, E11.9).   diphenhydramine-acetaminophen (TYLENOL PM) 25-500 MG TABS tablet Take 1 tablet by mouth at bedtime as needed.   escitalopram (LEXAPRO) 20 MG tablet Take 1 tablet (20 mg total) by mouth daily.   fluticasone (FLONASE) 50 MCG/ACT nasal spray USE 2 SPRAY(S) IN EACH NOSTRIL AT BEDTIME (Patient taking differently: Place 2 sprays into both nostrils daily as needed. USE 2 SPRAY(S) IN EACH NOSTRIL AT BEDTIME)   Fluticasone-Umeclidin-Vilant (TRELEGY ELLIPTA) 100-62.5-25 MCG/INH AEPB Take 1 puff by mouth daily.   folic acid (FOLVITE) 1 MG tablet Take 1 tablet (1 mg total) by mouth daily.   gabapentin (NEURONTIN) 300 MG capsule Take 2 capsules (600 mg total) by mouth 2 (two) times daily.   insulin degludec (TRESIBA FLEXTOUCH) 100 UNIT/ML FlexTouch Pen INJECT 12-30 UNITS OF INSULIN INTO THE SKIN DAILY TO KEEP FASTING GLUCOSE BETWEEN 100-140   Insulin Pen Needle 31G X 8 MM MISC 1 each by Does not apply route daily.  lidocaine-prilocaine (EMLA) cream Apply 1 application topically as needed. Apply to port and cover with saran wrap 1-2 hours prior to port access (Patient not taking: Reported on 02/03/2022)   Facility-Administered Encounter Medications as of 02/03/2022  Medication   heparin lock flush 100 UNIT/ML injection    Allergies (verified) Other, Cisplatin, and Bydureon  [exenatide]   History: Past Medical History:  Diagnosis Date   Allergy    Asthma    Carpal tunnel syndrome on left    Cervical radiculitis    Chronic osteoarthritis    Corns and callosity    Depression    Dermatophytosis of foot    Diabetes mellitus without complication (HCC)    Gout    Hyperlipidemia    Hypertension    Impingement syndrome of left shoulder    Intrahepatic cholangiocarcinoma (HCC)    Lumbosacral neuritis    Microalbuminuria    Obesity    Supraventricular tachycardia ([place]) 07/01/2015   SVT (supraventricular tachycardia) (HCC)    Tenosynovitis of wrist    Vitamin D deficiency    Past Surgical History:  Procedure Laterality Date   ABDOMINAL HYSTERECTOMY     COLONOSCOPY WITH PROPOFOL N/A 05/06/2021   Procedure: COLONOSCOPY WITH PROPOFOL;  Surgeon: Lucilla Lame, MD;  Location: [facility] ENDOSCOPY;  Service: Endoscopy;  Laterality: N/A;   ESOPHAGOGASTRODUODENOSCOPY (EGD) WITH PROPOFOL N/A 05/06/2021   Procedure: ESOPHAGOGASTRODUODENOSCOPY (EGD) WITH PROPOFOL;  Surgeon: Lucilla Lame, MD;  Location: [facility] ENDOSCOPY;  Service: Endoscopy;  Laterality: N/A;   PORTA CATH INSERTION N/A 07/17/2021   Procedure: PORTA CATH INSERTION;  Surgeon: Algernon Huxley, MD;  Location: [place] [facility];  Service: Cardiovascular;  Laterality: N/A;   Family History  Problem Relation Age of Onset   Heart attack Mother    CAD Mother    Kidney disease Father    Social History   Socioeconomic History   Marital status: Single    Spouse name: Not on file   Number of children: 2   Years of education: Not on file   Highest education level: Not on file  Occupational History   Occupation: disable     Comment: from chronic back pain, 2019  Tobacco Use   Smoking status: Some Days    Types: Cigarettes   Smokeless tobacco: Never   Tobacco comments:    1 per day 01/30/21 does not inhale it  Vaping Use   Vaping Use: Never used  Substance and Sexual Activity   Alcohol use: Not Currently    Drug use: No   Sexual activity: Not Currently  Other Topics Concern   Not on file  Social History Narrative   She is on disability for chronic back pain. Pt's son lives with her   Social Determinants of Health   Financial Resource Strain: Medium Risk   Difficulty of Paying Living Expenses: Somewhat hard  Food Insecurity: Food Insecurity Present   Worried About Charity fundraiser in the Last Year: Sometimes true   Arboriculturist in the Last Year: Never true  Transportation Needs: No Transportation Needs   Lack of Transportation (Medical): No   Lack of Transportation (Non-Medical): No  Physical Activity: Inactive   Days of Exercise per Week: 0 days   Minutes of Exercise per Session: 0 min  Stress: No Stress Concern Present   Feeling of Stress : Only a little  Social Connections: Moderately Isolated   Frequency of Communication with Friends and Family: More than three times a week   Frequency  of Social Gatherings with Friends and Family: Three times a week   Attends Religious Services: More than 4 times per year   Active Member of Clubs or Organizations: No   Attends Archivist Meetings: Never   Marital Status: Never married    Tobacco Counseling Ready to quit: Not Answered Counseling given: Not Answered Tobacco comments: 1 per day 01/30/21 does not inhale it   Clinical Intake:  Pre-visit preparation completed: Yes  Pain : No/denies pain     Nutritional Risks: None Diabetes: Yes CBG done?: No Did pt. bring in CBG monitor from home?: No  Nutrition Risk Assessment:  Has the patient had any N/V/D within the last 2 months?  No  Does the patient have any non-healing wounds?  No  Has the patient had any unintentional weight loss or weight gain?  No   Diabetes:  Is the patient diabetic?  Yes  If diabetic, was a CBG obtained today?  No  Did the patient bring in their glucometer from home?  No  How often do you monitor your CBG's? daily.   Financial  Strains and Diabetes Management:  Are you having any financial strains with the device, your supplies or your medication? No .  Does the patient want to be seen by Chronic Care Management for management of their diabetes?  No  Would the patient like to be referred to a Nutritionist or for Diabetic Management?  No   Diabetic Exams:  Diabetic Eye Exam: Overdue for diabetic eye exam. Pt has been advised about the importance in completing this exam.   Diabetic Foot Exam: Completed 12/26/21.     How often do you need to have someone help you when you read instructions, pamphlets, or other written materials from your doctor or pharmacy?: 1 - Never    Interpreter Needed?: No  Information entered by :: Janice Marker LPN   Activities of Daily Living In your present state of health, do you have any difficulty performing the following activities: 02/03/2022 12/26/2021  Hearing? N N  Vision? N N  Difficulty concentrating or making decisions? N N  Walking or climbing stairs? N N  Dressing or bathing? N N  Doing errands, shopping? N N  Preparing Food and eating ? N -  Using the Toilet? N -  In the past six months, have you accidently leaked urine? N -  Do you have problems with loss of bowel control? N -  Managing your Medications? N -  Managing your Finances? N -  Housekeeping or managing your Housekeeping? N -  Some recent data might be hidden    Patient Care Team: Steele Sizer, MD as PCP - General (Family Medicine) Clent Jacks, RN as Oncology Nurse Navigator [place], [place], LCSW as Social Worker Germaine Pomfret, [place] (Pharmacist) Sindy Guadeloupe, MD as Consulting Physician (Oncology) [place], Kirt Boys, NP as Nurse Practitioner [facility] and Palliative Medicine)  Indicate any recent Medical Services you may have received from other than [facility] providers in the past year (date may be approximate).     Assessment:   This is a routine wellness examination for  [place].  Hearing/Vision screen Hearing Screening - Comments::  Pt denies hearing difficulty Vision Screening - Comments:: Pt past due for eye exam. Pt established with [facility].  Dietary issues and exercise activities discussed: Current Exercise Habits: The patient does not participate in regular exercise at present, Exercise limited by: None identified   Goals Addressed  This Visit's Progress    Quit Smoking   Not on track    If you wish to quit smoking, help is available. For free tobacco cessation program offerings call the [facility] at 214-064-4289 or Live Well Line at 903-552-8374. You may also visit [email or web address] or email [email or web address] for more information on other programs.         Depression Screen PHQ 2/9 Scores 02/03/2022 12/26/2021 11/13/2021 11/13/2021 09/19/2021 05/02/2021 04/29/2021  PHQ - 2 Score 2 0 0 0 2 2 2   PHQ- 9 Score 2 0 0 0 3 3 3     Fall Risk Fall Risk  02/03/2022 12/26/2021 11/13/2021 11/13/2021 09/19/2021  Falls in the past year? 0 0 0 0 0  Number falls in past yr: 0 0 0 0 0  Injury with Fall? 0 0 0 0 0  Comment - - - - -  Risk for fall due to : No Fall Risks No Fall Risks - No Fall Risks No Fall Risks  Follow up Falls prevention discussed Falls prevention discussed - Falls prevention discussed Falls prevention discussed    FALL RISK PREVENTION PERTAINING TO THE HOME:  Any stairs in or around the home? No  If so, are there any without handrails? No  Home free of loose throw rugs in walkways, pet beds, electrical cords, etc? Yes  Adequate lighting in your home to reduce risk of falls? Yes   ASSISTIVE DEVICES UTILIZED TO PREVENT FALLS:  Life alert? No  Use of a cane, walker or w/c? No  Grab bars in the bathroom? No  Shower chair or bench in shower? No  Elevated toilet seat or a handicapped toilet? No   TIMED UP AND GO:  Was the test performed? No . Telephonic visit  Cognitive Function:  Normal cognitive status assessed by direct observation by this Nurse Health Advisor. No abnormalities found.       6CIT Screen 01/30/2020  What Year? 0 points  What month? 0 points  What time? 0 points  Count back from 20 0 points  Months in reverse 2 points  Repeat phrase 0 points  Total Score 2    Immunizations Immunization History  Administered Date(s) Administered   Influenza,inj,Quad PF,6+ Mos 08/14/2013, 08/02/2014, 11/19/2015, 10/15/2016, 12/26/2020, 09/19/2021   Influenza-Unspecified 08/02/2014   PPD Test 07/01/2015, 09/09/2016   Pneumococcal Conjugate-13 04/20/2018   Pneumococcal Polysaccharide-23 02/08/2019   Td 05/08/2010   Tdap 05/08/2010    TDAP status: Due, Education has been provided regarding the importance of this vaccine. Advised may receive this vaccine at local pharmacy or Health Dept. Aware to provide a copy of the vaccination record if obtained from local pharmacy or Health Dept. Verbalized acceptance and understanding.  Flu Vaccine status: Up to date  Pneumococcal vaccine status: Up to date  Covid-19 vaccine status: Declined, Education has been provided regarding the importance of this vaccine but patient still declined. Advised may receive this vaccine at local pharmacy or Health Dept.or vaccine clinic. Aware to provide a copy of the vaccination record if obtained from local pharmacy or Health Dept. Verbalized acceptance and understanding.  Qualifies for Shingles Vaccine? Yes   Zostavax completed No   Shingrix Completed?: No.    Education has been provided regarding the importance of this vaccine. Patient has been advised to call insurance company to determine out of pocket expense if they have not yet received this vaccine. Advised may also receive vaccine at local pharmacy or Health Dept. Verbalized acceptance and  understanding.  Screening Tests Health Maintenance  Topic Date Due   OPHTHALMOLOGY EXAM  Never done   Zoster Vaccines- Shingrix (1 of 2)  Never done   MAMMOGRAM  04/23/2016   TETANUS/TDAP  05/08/2020   COVID-19 Vaccine (1) 02/19/2022 (Originally 08/27/1961)   HEMOGLOBIN A1C  06/25/2022   FOOT EXAM  12/26/2022   URINE MICROALBUMIN  12/26/2022   COLONOSCOPY (Pts 45-30yr Insurance coverage will need to be confirmed)  05/06/2026   INFLUENZA VACCINE  Completed   Hepatitis C Screening  Completed   HIV Screening  Completed   HPV VACCINES  Aged Out    Health Maintenance  Health Maintenance Due  Topic Date Due   OPHTHALMOLOGY EXAM  Never done   Zoster Vaccines- Shingrix (1 of 2) Never done   MAMMOGRAM  04/23/2016   TETANUS/TDAP  05/08/2020    Colorectal cancer screening: Type of screening: Colonoscopy. Completed 05/06/21. Repeat every 5 years  Mammogram status: Completed 04/23/14. Repeat every year. Pt declines repeat screening at this time.   Bone density status: due age 61 Lung Cancer Screening: (Low Dose CT Chest recommended if Age 63-80years, 30 pack-year currently smoking OR have quit w/in 15years.) does not qualify.   Additional Screening:  Hepatitis C Screening: does qualify; Completed 04/21/21  Vision Screening: Recommended annual ophthalmology exams for early detection of glaucoma and other disorders of the eye. Is the patient up to date with their annual eye exam?  No  Who is the provider or what is the name of the office in which the patient attends annual [facility] exams? [facility]   Dental Screening: Recommended annual dental exams for proper oral hygiene  Community Resource Referral / Chronic Care Management: CRR required this visit?  No   CCM required this visit?  No      Plan:     I have personally reviewed and noted the following in the patients chart:   Medical and social history Use of alcohol, tobacco or illicit drugs  Current medications and supplements including opioid prescriptions.  Functional ability and status Nutritional status Physical activity Advanced  directives List of other physicians Hospitalizations, surgeries, and ER visits in previous 12 months Vitals Screenings to include cognitive, depression, and falls Referrals and appointments  In addition, I have reviewed and discussed with patient certain preventive protocols, quality metrics, and best practice recommendations. A written personalized care plan for preventive services as well as general preventive health recommendations were provided to patient.     KClemetine Marker LPN   [id number]  Nurse Notes: none

## 2022-02-03 NOTE — Patient Instructions (Addendum)
Janice Short , Thank you for taking time to come for your Medicare Wellness Visit. I appreciate your ongoing commitment to your health goals. Please review the following plan we discussed and let me know if I can assist you in the future.   CALL [facility] at [phone] to confirm they have received your prescriptions.   Screening recommendations/referrals: Colonoscopy: done 05/06/21. Repeat 04/2026 Mammogram: done 04/23/14. Due for repeat screening.  Bone Density: due age 1 Recommended yearly ophthalmology/optometry visit for glaucoma screening and checkup Recommended yearly dental visit for hygiene and checkup  Vaccinations: Influenza vaccine: done 09/19/21 Pneumococcal vaccine: done 02/08/19 Tdap vaccine: due Shingles vaccine: Shingrix discussed. Please contact your pharmacy for coverage information.  Covid-19: declined  Advanced directives: Advance directive discussed with you today. I have provided a copy for you to complete at home and have notarized. Once this is complete please bring a copy in to our office so we can scan it into your chart.   Conditions/risks identified: If you wish to quit smoking, help is available. For free tobacco cessation program offerings call the [facility] at [phone] or Live Well Line at [phone]. You may also visit [email or web address] or email [email or web address] for more information on other programs.   Next appointment: Follow up in one year for your annual wellness visit.   Preventive Care 40-64 Years, Female Preventive care refers to lifestyle choices and visits with your health care provider that can promote health and wellness. What does preventive care include? A yearly physical exam. This is also called an annual well check. Dental exams once or twice a year. Routine eye exams. Ask your health care provider how often you should have your eyes checked. Personal lifestyle choices, including: Daily care of your  teeth and gums. Regular physical activity. Eating a healthy diet. Avoiding tobacco and drug use. Limiting alcohol use. Practicing safe sex. Taking low-dose aspirin daily starting at age 60. Taking vitamin and mineral supplements as recommended by your health care provider. What happens during an annual well check? The services and screenings done by your health care provider during your annual well check will depend on your age, overall health, lifestyle risk factors, and family history of disease. Counseling  Your health care provider may ask you questions about your: Alcohol use. Tobacco use. Drug use. Emotional well-being. Home and relationship well-being. Sexual activity. Eating habits. Work and work Statistician. Method of birth control. Menstrual cycle. Pregnancy history. Screening  You may have the following tests or measurements: Height, weight, and BMI. Blood pressure. Lipid and cholesterol levels. These may be checked every 5 years, or more frequently if you are over 15 years old. Skin check. Lung cancer screening. You may have this screening every year starting at age 65 if you have a 30-pack-year history of smoking and currently smoke or have quit within the past 15 years. Fecal occult blood test (FOBT) of the stool. You may have this test every year starting at age 15. Flexible sigmoidoscopy or colonoscopy. You may have a sigmoidoscopy every 5 years or a colonoscopy every 10 years starting at age 27. Hepatitis C blood test. Hepatitis B blood test. Sexually transmitted disease (STD) testing. Diabetes screening. This is done by checking your blood sugar (glucose) after you have not eaten for a while (fasting). You may have this done every 1-3 years. Mammogram. This may be done every 1-2 years. Talk to your health care provider about when you should start having regular mammograms. This may depend  on whether you have a family history of breast cancer. BRCA-related cancer  screening. This may be done if you have a family history of breast, ovarian, tubal, or peritoneal cancers. Pelvic exam and Pap test. This may be done every 3 years starting at age 21. Starting at age 75, this may be done every 5 years if you have a Pap test in combination with an HPV test. Bone density scan. This is done to screen for osteoporosis. You may have this scan if you are at high risk for osteoporosis. Discuss your test results, treatment options, and if necessary, the need for more tests with your health care provider. Vaccines  Your health care provider may recommend certain vaccines, such as: Influenza vaccine. This is recommended every year. Tetanus, diphtheria, and acellular pertussis (Tdap, Td) vaccine. You may need a Td booster every 10 years. Zoster vaccine. You may need this after age 28. Pneumococcal 13-valent conjugate (PCV13) vaccine. You may need this if you have certain conditions and were not previously vaccinated. Pneumococcal polysaccharide (PPSV23) vaccine. You may need one or two doses if you smoke cigarettes or if you have certain conditions. Talk to your health care provider about which screenings and vaccines you need and how often you need them. This information is not intended to replace advice given to you by your health care provider. Make sure you discuss any questions you have with your health care provider. Document Released: 12/20/2015 Document Revised: 08/12/2016 Document Reviewed: 09/24/2015 Elsevier Interactive Patient Education  2017 [place] Prevention in the Home Falls can cause injuries. They can happen to people of all ages. There are many things you can do to make your home safe and to help prevent falls. What can I do on the outside of my home? Regularly fix the edges of walkways and driveways and fix any cracks. Remove anything that might make you trip as you walk through a door, such as a raised step or threshold. Trim any  bushes or trees on the path to your home. Use bright outdoor lighting. Clear any walking paths of anything that might make someone trip, such as rocks or tools. Regularly check to see if handrails are loose or broken. Make sure that both sides of any steps have handrails. Any raised decks and porches should have guardrails on the edges. Have any leaves, snow, or ice cleared regularly. Use sand or salt on walking paths during winter. Clean up any spills in your garage right away. This includes oil or grease spills. What can I do in the bathroom? Use night lights. Install grab bars by the toilet and in the tub and shower. Do not use towel bars as grab bars. Use non-skid mats or decals in the tub or shower. If you need to sit down in the shower, use a plastic, non-slip stool. Keep the floor dry. Clean up any water that spills on the floor as soon as it happens. Remove soap buildup in the tub or shower regularly. Attach bath mats securely with double-sided non-slip rug tape. Do not have throw rugs and other things on the floor that can make you trip. What can I do in the bedroom? Use night lights. Make sure that you have a light by your bed that is easy to reach. Do not use any sheets or blankets that are too big for your bed. They should not hang down onto the floor. Have a firm chair that has side arms. You can use  this for support while you get dressed. Do not have throw rugs and other things on the floor that can make you trip. What can I do in the kitchen? Clean up any spills right away. Avoid walking on wet floors. Keep items that you use a lot in easy-to-reach places. If you need to reach something above you, use a strong step stool that has a grab bar. Keep electrical cords out of the way. Do not use floor polish or wax that makes floors slippery. If you must use wax, use non-skid floor wax. Do not have throw rugs and other things on the floor that can make you trip. What can I do  with my stairs? Do not leave any items on the stairs. Make sure that there are handrails on both sides of the stairs and use them. Fix handrails that are broken or loose. Make sure that handrails are as long as the stairways. Check any carpeting to make sure that it is firmly attached to the stairs. Fix any carpet that is loose or worn. Avoid having throw rugs at the top or bottom of the stairs. If you do have throw rugs, attach them to the floor with carpet tape. Make sure that you have a light switch at the top of the stairs and the bottom of the stairs. If you do not have them, ask someone to add them for you. What else can I do to help prevent falls? Wear shoes that: Do not have high heels. Have rubber bottoms. Are comfortable and fit you well. Are closed at the toe. Do not wear sandals. If you use a stepladder: Make sure that it is fully opened. Do not climb a closed stepladder. Make sure that both sides of the stepladder are locked into place. Ask someone to hold it for you, if possible. Clearly mark and make sure that you can see: Any grab bars or handrails. First and last steps. Where the edge of each step is. Use tools that help you move around (mobility aids) if they are needed. These include: Canes. Walkers. Scooters. Crutches. Turn on the lights when you go into a dark area. Replace any light bulbs as soon as they burn out. Set up your furniture so you have a clear path. Avoid moving your furniture around. If any of your floors are uneven, fix them. If there are any pets around you, be aware of where they are. Review your medicines with your doctor. Some medicines can make you feel dizzy. This can increase your chance of falling. Ask your doctor what other things that you can do to help prevent falls. This information is not intended to replace advice given to you by your health care provider. Make sure you discuss any questions you have with your health care  provider. Document Released: 09/19/2009 Document Revised: 04/30/2016 Document Reviewed: 12/28/2014 Elsevier Interactive Patient Education  2017 [employer].

## 2022-02-09 ENCOUNTER — Inpatient Hospital Stay: Payer: Medicare Other | Attending: Hospice and Palliative Medicine | Admitting: Hospice and Palliative Medicine

## 2022-02-09 DIAGNOSIS — D509 Iron deficiency anemia, unspecified: Secondary | ICD-10-CM | POA: Insufficient documentation

## 2022-02-09 DIAGNOSIS — C221 Intrahepatic bile duct carcinoma: Secondary | ICD-10-CM | POA: Insufficient documentation

## 2022-02-09 DIAGNOSIS — Z5111 Encounter for antineoplastic chemotherapy: Secondary | ICD-10-CM | POA: Insufficient documentation

## 2022-02-09 DIAGNOSIS — D6481 Anemia due to antineoplastic chemotherapy: Secondary | ICD-10-CM | POA: Insufficient documentation

## 2022-02-09 NOTE — Progress Notes (Signed)
Virtual Visit via Telephone Note ? ?I connected with Janice Short on 02/09/22 at  2:00 PM EST by telephone and verified that I am speaking with the correct person using two identifiers. ? ?Location: ?Patient: Home ?Provider: Clinic ?  ?I discussed the limitations, risks, security and privacy concerns of performing an evaluation and management service by telephone and the availability of in person appointments. I also discussed with the patient that there may be a patient responsible charge related to this service. The patient expressed understanding and agreed to proceed. ? ? ?History of Present Illness: ?Janice Short is a 61 y.o. female with multiple medical problems including hypertension, hyperlipidemia, and type 2 diabetes, who was recently diagnosed with cholangiocarcinoma.  Patient was initially unsure regarding treatment options.  She was referred to palliative care to help address goals. ?  ?Observations/Objective: ?Spoke with patient by phone.  She reports that she is doing reasonably well.  She denies significant symptomatic burden at present.  She does endorse financial distress and is hopeful that the cancer center will be able to help with her utility payments.  We will reach out to social work ? ?Assessment and Plan: ?Stage IV cholangiocarcinoma -on systemic chemotherapy.  Seems to be doing well clinically without any significant symptomatic burden at present.  Will follow. ? ?Financial distress - SW referral ? ?Follow Up Instructions: ?Follow-up telephone visit 2 to 3 months ?  ?I discussed the assessment and treatment plan with the patient. The patient was provided an opportunity to ask questions and all were answered. The patient agreed with the plan and demonstrated an understanding of the instructions. ?  ?The patient was advised to call back or seek an in-person evaluation if the symptoms worsen or if the condition fails to improve as anticipated. ? ?I provided 5 minutes of non-face-to-face time  during this encounter. ? ? ?Irean Hong, NP ? ? ?

## 2022-02-10 ENCOUNTER — Encounter: Payer: Self-pay | Admitting: Licensed Clinical Social Worker

## 2022-02-10 NOTE — Progress Notes (Signed)
[place] CSW Progress Note ? ?Clinical Social Worker  updated Designer, jewellery  to follow-up with patient about utility bill, patient has made request for payment. Fund Freight forwarder updated CSW and stated she would contact patient directly.. ? ? ?Jeane Cashatt , LCSW ?

## 2022-02-13 ENCOUNTER — Other Ambulatory Visit: Payer: Self-pay

## 2022-02-13 ENCOUNTER — Encounter: Payer: Self-pay | Admitting: Oncology

## 2022-02-13 ENCOUNTER — Inpatient Hospital Stay: Payer: Medicare Other

## 2022-02-13 ENCOUNTER — Other Ambulatory Visit: Payer: Self-pay | Admitting: Family Medicine

## 2022-02-13 ENCOUNTER — Inpatient Hospital Stay (HOSPITAL_BASED_OUTPATIENT_CLINIC_OR_DEPARTMENT_OTHER): Payer: Medicare Other | Admitting: Oncology

## 2022-02-13 VITALS — BP 142/76 | HR 84

## 2022-02-13 VITALS — BP 133/59 | HR 88 | Temp 97.6°F | Resp 18 | Wt 167.3 lb

## 2022-02-13 DIAGNOSIS — D509 Iron deficiency anemia, unspecified: Secondary | ICD-10-CM

## 2022-02-13 DIAGNOSIS — D6481 Anemia due to antineoplastic chemotherapy: Secondary | ICD-10-CM

## 2022-02-13 DIAGNOSIS — T451X5A Adverse effect of antineoplastic and immunosuppressive drugs, initial encounter: Secondary | ICD-10-CM

## 2022-02-13 DIAGNOSIS — Z5111 Encounter for antineoplastic chemotherapy: Secondary | ICD-10-CM | POA: Diagnosis not present

## 2022-02-13 DIAGNOSIS — E119 Type 2 diabetes mellitus without complications: Secondary | ICD-10-CM

## 2022-02-13 DIAGNOSIS — J449 Chronic obstructive pulmonary disease, unspecified: Secondary | ICD-10-CM

## 2022-02-13 DIAGNOSIS — C221 Intrahepatic bile duct carcinoma: Secondary | ICD-10-CM

## 2022-02-13 DIAGNOSIS — I10 Essential (primary) hypertension: Secondary | ICD-10-CM

## 2022-02-13 DIAGNOSIS — F17210 Nicotine dependence, cigarettes, uncomplicated: Secondary | ICD-10-CM

## 2022-02-13 LAB — CBC WITH DIFFERENTIAL/PLATELET
Abs Immature Granulocytes: 0.01 10*3/uL (ref 0.00–0.07)
Basophils Absolute: 0 10*3/uL (ref 0.0–0.1)
Basophils Relative: 1 %
Eosinophils Absolute: 0.3 10*3/uL (ref 0.0–0.5)
Eosinophils Relative: 4 %
HCT: 27.2 % — ABNORMAL LOW (ref 36.0–46.0)
Hemoglobin: 8.2 g/dL — ABNORMAL LOW (ref 12.0–15.0)
Immature Granulocytes: 0 %
Lymphocytes Relative: 24 %
Lymphs Abs: 1.7 10*3/uL (ref 0.7–4.0)
MCH: 29 pg (ref 26.0–34.0)
MCHC: 30.1 g/dL (ref 30.0–36.0)
MCV: 96.1 fL (ref 80.0–100.0)
Monocytes Absolute: 1 10*3/uL (ref 0.1–1.0)
Monocytes Relative: 14 %
Neutro Abs: 4 10*3/uL (ref 1.7–7.7)
Neutrophils Relative %: 57 %
Platelets: 103 10*3/uL — ABNORMAL LOW (ref 150–400)
RBC: 2.83 MIL/uL — ABNORMAL LOW (ref 3.87–5.11)
RDW: 22.5 % — ABNORMAL HIGH (ref 11.5–15.5)
WBC: 6.9 10*3/uL (ref 4.0–10.5)
nRBC: 0 % (ref 0.0–0.2)

## 2022-02-13 LAB — COMPREHENSIVE METABOLIC PANEL
ALT: 19 U/L (ref 0–44)
AST: 29 U/L (ref 15–41)
Albumin: 2.9 g/dL — ABNORMAL LOW (ref 3.5–5.0)
Alkaline Phosphatase: 214 U/L — ABNORMAL HIGH (ref 38–126)
Anion gap: 3 — ABNORMAL LOW (ref 5–15)
BUN: 15 mg/dL (ref 8–23)
CO2: 22 mmol/L (ref 22–32)
Calcium: 7.9 mg/dL — ABNORMAL LOW (ref 8.9–10.3)
Chloride: 111 mmol/L (ref 98–111)
Creatinine, Ser: 1.06 mg/dL — ABNORMAL HIGH (ref 0.44–1.00)
GFR, Estimated: 59 mL/min — ABNORMAL LOW (ref >60)
Glucose, Bld: 136 mg/dL — ABNORMAL HIGH (ref 70–99)
Potassium: 5.3 mmol/L — ABNORMAL HIGH (ref 3.5–5.1)
Sodium: 136 mmol/L (ref 135–145)
Total Bilirubin: 0.5 mg/dL (ref 0.3–1.2)
Total Protein: 7.5 g/dL (ref 6.5–8.1)

## 2022-02-13 LAB — MAGNESIUM: Magnesium: 2.2 mg/dL (ref 1.7–2.4)

## 2022-02-13 MED ORDER — PALONOSETRON HCL INJECTION 0.25 MG/5ML
0.2500 mg | Freq: Once | INTRAVENOUS | Status: AC
  Administered 2022-02-13: 0.25 mg via INTRAVENOUS

## 2022-02-13 MED ORDER — SODIUM CHLORIDE 0.9 % IV SOLN
126.3000 mg | Freq: Once | INTRAVENOUS | Status: AC
  Administered 2022-02-13: 130 mg via INTRAVENOUS
  Filled 2022-02-13: qty 13

## 2022-02-13 MED ORDER — IRON SUCROSE 20 MG/ML IV SOLN
200.0000 mg | Freq: Once | INTRAVENOUS | Status: AC
  Administered 2022-02-13: 200 mg via INTRAVENOUS

## 2022-02-13 MED ORDER — SODIUM CHLORIDE 0.9 % IV SOLN
Freq: Once | INTRAVENOUS | Status: AC
  Filled 2022-02-13: qty 250

## 2022-02-13 MED ORDER — SODIUM CHLORIDE 0.9 % IV SOLN
10.0000 mg | Freq: Once | INTRAVENOUS | Status: AC
  Administered 2022-02-13: 10 mg via INTRAVENOUS
  Filled 2022-02-13: qty 10

## 2022-02-13 MED ORDER — ALBUTEROL SULFATE HFA 108 (90 BASE) MCG/ACT IN AERS: INHALATION_SPRAY | RESPIRATORY_TRACT | 2 refills | Status: DC

## 2022-02-13 MED ORDER — HEPARIN SOD (PORK) LOCK FLUSH 100 UNIT/ML IV SOLN
500.0000 [IU] | Freq: Once | INTRAVENOUS | Status: AC | PRN
  Administered 2022-02-13: 500 [IU]
  Filled 2022-02-13: qty 5

## 2022-02-13 MED ORDER — SODIUM CHLORIDE 0.9 % IV SOLN
200.0000 mg | INTRAVENOUS | Status: DC
  Filled 2022-02-13: qty 10

## 2022-02-13 MED ORDER — SODIUM CHLORIDE 0.9 % IV SOLN
1400.0000 mg | Freq: Once | INTRAVENOUS | Status: AC
  Administered 2022-02-13: 1400 mg via INTRAVENOUS
  Filled 2022-02-13: qty 26.3

## 2022-02-13 NOTE — Telephone Encounter (Signed)
Medication: albuterol (VENTOLIN HFA) 108 (90 Base) MCG/ACT inhaler [[id number]]  ? ?Has the patient contacted their pharmacy? YES Pharmacy Calling  ?(Agent: If no, request that the patient contact the pharmacy for the refill. If patient does not wish to contact the pharmacy document the reason why and proceed with request.) ?(Agent: If yes, when and what did the pharmacy advise?) ? ?Preferred Pharmacy (with phone number or street name): [facility] - [place], [address] [place] [address] ?Phone: [phone] Fax: [phone] ?Hours: Not open 24 hours ? ? ?Has the patient been seen for an appointment in the last year OR does the patient have an upcoming appointment? YES 09/19/21 ? ?Agent: Please be advised that RX refills may take up to 3 business days. We ask that you follow-up with your pharmacy. ?

## 2022-02-13 NOTE — Telephone Encounter (Signed)
Requested Prescriptions  ?Pending Prescriptions Disp Refills  ?? albuterol (VENTOLIN HFA) 108 (90 Base) MCG/ACT inhaler 18 g 0  ?  Sig: INHALE 2 PUFFS BY MOUTH EVERY 6 HOURS AS NEEDED FOR WHEEZING AND FOR SHORTNESS OF BREATH  ?  ? Pulmonology:  Beta Agonists 2 Failed - 02/13/2022  4:54 PM  ?  ?  Failed - Last BP in normal range  ?  BP Readings from Last 1 Encounters:  ?02/13/22 (!) 142/76  ?   ?  ?  Passed - Last Heart Rate in normal range  ?  Pulse Readings from Last 1 Encounters:  ?02/13/22 84  ?   ?  ?  Passed - Valid encounter within last 12 months  ?  Recent Outpatient Visits   ?      ? 1 month ago Diabetes mellitus type 2 with complications ([place])  ? [facility] [place], Drue Stager, MD  ? 3 months ago Chronic left shoulder pain  ? [place], DO  ? 4 months ago Diabetes mellitus type 2 with complications [facility])  ? [facility] [place], Drue Stager, MD  ? 9 months ago Diabetes mellitus type 2 with complications [facility])  ? [facility] [place], Drue Stager, MD  ? 1 year ago Uncontrolled type 2 diabetes mellitus with microalbuminuria ([place])  ? [facility] Steele Sizer, MD  ?  ?  ?Future Appointments   ?        ? In 60 months [place]   ?  ? ?  ?  ?  ? ? ?

## 2022-02-13 NOTE — Progress Notes (Signed)
Hematology/Oncology Consult note [facility]  Telephone:([phone] Fax:([phone]) [phone]  Patient Care Team: Steele Sizer, MD as PCP - General (Family Medicine) Clent Jacks, RN as Oncology Nurse Navigator Mabel, [place], LCSW as Social Worker Germaine Pomfret, [place] (Pharmacist) Sindy Guadeloupe, MD as Consulting Physician (Oncology) Borders, Kirt Boys, NP as Nurse Practitioner [facility] and Palliative Medicine)   Name of the patient: Janice Short  [id number]  December 04, 1961   Date of visit: 02/13/22  Diagnosis-  metastatic intrahepatic cholangiocarcinoma  Chief complaint/ Reason for visit-on treatment assessment prior to cycle 9-day 15 of carboplatin gemcitabine chemotherapy  Heme/Onc history: Patient is a 61 year old African-American female with a past medical history significant for hypertension hyperlipidemia and type 2 diabetes who presented to the ER with symptoms of cramping abdominal pain and nausea.  This was followed by right upper quadrant ultrasound which showed multiple echogenic masses in the liver with nodular contour of the liver possibly suggestive of cirrhosis.  Several nodular masses in the vicinity of the pancreatic head and porta hepatic lymph nodes.  This was followed by an MRI abdomen and MRCP.  It showed right hepatic lobe masslike area measuring 4.9 x 2.9 cm.  Diffusion abnormality in the left hepatic lobe with mild left-sided biliary ductal dilatation and another area of abnormality measuring 1.4 x 1 cm.  Bulky celiac adenopathy 3 x 2.1 cm tracking into gastrohepatic recess.  Suspected portal venous invasion with convex protrusion in the right portal vein.  Left adrenal lesion measuring 2.9 x 1.5 cm.  Findings concerning for cholangiocarcinoma or biphenotypic cholangiole hepatocellular neoplasm.     Patient underwent CT-guided liver biopsy Which was consistent with adenocarcinoma poorly differentiated.  Immunohistochemistry showed CK7  positivity, CDX2 negative, Heppar negative, GATA3 negative.  CK19 positive.  Differentials include cholangiocarcinoma, pancreatic, upper GI.  Lung and breast less likely.  However given CK19 positivity most compatible with cholangiocarcinoma.   Patient's case was discussed at tumor board and it was felt as if the lymphadenopathy was more like a confluent primary tumor mass.  Patient was referred to [facility] for second opinion to see if she would be a candidate for surgery.However upon their review of the PET scan it was deemed that this was indeed periportal adenopathy.  They also did a CT chest abdomen and pelvis with MIPS protocol and she was also found to have portocaval lymph node measuring 2.4 cm which was more consistent with metastatic disease.  She was deemed unresectable   NGS testing showed BRCA2, FGFR2, PIK3CA, RAD 54L, CDC73, CDKN2 A/B CR EBBP rearrangement exon 31    Patient has been getting gemcitabine cisplatin chemotherapy 2 weeks on 1 week off since August 2022.  Patient had an allergic reaction to cisplatin and therefore was switched to carboplatin gemcitabine chemotherapy which she gets week on week off  Interval history-tolerating chemotherapy well so far.  Denies any abdominal pain nausea or vomiting.Appetite is good  ECOG PS- 1 Pain scale- 0   Review of systems- Review of Systems  Constitutional:  Negative for chills, fever, malaise/fatigue and weight loss.  HENT:  Negative for congestion, ear discharge and nosebleeds.   Eyes:  Negative for blurred vision.  Respiratory:  Negative for cough, hemoptysis, sputum production, shortness of breath and wheezing.   Cardiovascular:  Negative for chest pain, palpitations, orthopnea and claudication.  Gastrointestinal:  Negative for abdominal pain, blood in stool, constipation, diarrhea, heartburn, melena, nausea and vomiting.  Genitourinary:  Negative for dysuria, flank  pain, frequency, hematuria and urgency.  Musculoskeletal:  Negative  for back pain, joint pain and myalgias.  Skin:  Negative for rash.  Neurological:  Negative for dizziness, tingling, focal weakness, seizures, weakness and headaches.  Endo/Heme/Allergies:  Does not bruise/bleed easily.  Psychiatric/Behavioral:  Negative for depression and suicidal ideas. The patient does not have insomnia.      Allergies  Allergen Reactions   Other Shortness Of Breath    Cat dander and grass pollen   Cisplatin Itching   Bydureon [Exenatide] Other (See Comments)    Pain with injection not allergy     Past Medical History:  Diagnosis Date   Allergy    Asthma    Carpal tunnel syndrome on left    Cervical radiculitis    Chronic osteoarthritis    Corns and callosity    Depression    Dermatophytosis of foot    Diabetes mellitus without complication (HCC)    Gout    Hyperlipidemia    Hypertension    Impingement syndrome of left shoulder    Intrahepatic cholangiocarcinoma (HCC)    Lumbosacral neuritis    Microalbuminuria    Obesity    Supraventricular tachycardia ([place]) 07/01/2015   SVT (supraventricular tachycardia) (HCC)    Tenosynovitis of wrist    Vitamin D deficiency      Past Surgical History:  Procedure Laterality Date   ABDOMINAL HYSTERECTOMY     COLONOSCOPY WITH PROPOFOL N/A 05/06/2021   Procedure: COLONOSCOPY WITH PROPOFOL;  Surgeon: Lucilla Lame, MD;  Location: [facility] ENDOSCOPY;  Service: Endoscopy;  Laterality: N/A;   ESOPHAGOGASTRODUODENOSCOPY (EGD) WITH PROPOFOL N/A 05/06/2021   Procedure: ESOPHAGOGASTRODUODENOSCOPY (EGD) WITH PROPOFOL;  Surgeon: Lucilla Lame, MD;  Location: [facility] ENDOSCOPY;  Service: Endoscopy;  Laterality: N/A;   PORTA CATH INSERTION N/A 07/17/2021   Procedure: PORTA CATH INSERTION;  Surgeon: Algernon Huxley, MD;  Location: [place] CV LAB;  Service: Cardiovascular;  Laterality: N/A;    Social History   Socioeconomic History   Marital status: Single    Spouse name: Not on file   Number of children: 2   Years of  education: Not on file   Highest education level: Not on file  Occupational History   Occupation: disable     Comment: from chronic back pain, 2019  Tobacco Use   Smoking status: Some Days    Types: Cigarettes   Smokeless tobacco: Never   Tobacco comments:    1 per day 01/30/21 does not inhale it  Vaping Use   Vaping Use: Never used  Substance and Sexual Activity   Alcohol use: Not Currently   Drug use: No   Sexual activity: Not Currently  Other Topics Concern   Not on file  Social History Narrative   She is on disability for chronic back pain. Pt's son lives with her   Social Determinants of Health   Financial Resource Strain: Medium Risk   Difficulty of Paying Living Expenses: Somewhat hard  Food Insecurity: Food Insecurity Present   Worried About Charity fundraiser in the Last Year: Sometimes true   Arboriculturist in the Last Year: Never true  Transportation Needs: No Transportation Needs   Lack of Transportation (Medical): No   Lack of Transportation (Non-Medical): No  Physical Activity: Inactive   Days of Exercise per Week: 0 days   Minutes of Exercise per Session: 0 min  Stress: No Stress Concern Present   Feeling of Stress : Only a little  Social Connections: Moderately  Isolated   Frequency of Communication with Friends and Family: More than three times a week   Frequency of Social Gatherings with Friends and Family: Three times a week   Attends Religious Services: More than 4 times per year   Active Member of Clubs or Organizations: No   Attends Archivist Meetings: Never   Marital Status: Never married  Human resources officer Violence: Not At Risk   Fear of Current or Ex-Partner: No   Emotionally Abused: No   Physically Abused: No   Sexually Abused: No    Family History  Problem Relation Age of Onset   Heart attack Mother    CAD Mother    Kidney disease Father      Current Outpatient Medications:    albuterol (VENTOLIN HFA) 108 (90 Base)  MCG/ACT inhaler, INHALE 2 PUFFS BY MOUTH EVERY 6 HOURS AS NEEDED FOR WHEEZING AND FOR SHORTNESS OF BREATH, Disp: 18 g, Rfl: 0   aspirin 81 MG tablet, Take 1 tablet by mouth daily., Disp: , Rfl:    atorvastatin (LIPITOR) 40 MG tablet, Take 1 tablet (40 mg total) by mouth at bedtime., Disp: 90 tablet, Rfl: 1   blood glucose meter kit and supplies, Dispense based on patient and insurance preference. Use up to four times daily as directed. (FOR ICD-10 E10.9, E11.9)., Disp: 1 each, Rfl: 0   diphenhydramine-acetaminophen (TYLENOL PM) 25-500 MG TABS tablet, Take 1 tablet by mouth at bedtime as needed., Disp: , Rfl:    escitalopram (LEXAPRO) 20 MG tablet, Take 1 tablet (20 mg total) by mouth daily., Disp: 90 tablet, Rfl: 0   fluticasone (FLONASE) 50 MCG/ACT nasal spray, USE 2 SPRAY(S) IN EACH NOSTRIL AT BEDTIME (Patient taking differently: Place 2 sprays into both nostrils daily as needed. USE 2 SPRAY(S) IN EACH NOSTRIL AT BEDTIME), Disp: 16 g, Rfl: 0   Fluticasone-Umeclidin-Vilant (TRELEGY ELLIPTA) 100-62.5-25 MCG/INH AEPB, Take 1 puff by mouth daily., Disp: 180 each, Rfl: 1   gabapentin (NEURONTIN) 300 MG capsule, Take 2 capsules (600 mg total) by mouth 2 (two) times daily., Disp: 360 capsule, Rfl: 0   insulin degludec (TRESIBA FLEXTOUCH) 100 UNIT/ML FlexTouch Pen, INJECT 12-30 UNITS OF INSULIN INTO THE SKIN DAILY TO KEEP FASTING GLUCOSE BETWEEN 100-140, Disp: 15 mL, Rfl: 0   Insulin Pen Needle 31G X 8 MM MISC, 1 each by Does not apply route daily., Disp: 100 each, Rfl: 1   lidocaine-prilocaine (EMLA) cream, Apply 1 application topically as needed. Apply to port and cover with saran wrap 1-2 hours prior to port access, Disp: 30 g, Rfl: 3   folic acid (FOLVITE) 1 MG tablet, Take 1 tablet (1 mg total) by mouth daily. (Patient not taking: Reported on 02/13/2022), Disp: 30 tablet, Rfl: 3 No current facility-administered medications for this visit.  Facility-Administered Medications Ordered in Other Visits:     CARBOplatin (PARAPLATIN) 130 mg in sodium chloride 0.9 % 100 mL chemo infusion, 130 mg, Intravenous, Once, Sindy Guadeloupe, MD, Last Rate: 226 mL/hr at 02/13/22 1235, 130 mg at 02/13/22 1235   heparin lock flush 100 UNIT/ML injection, , , ,    heparin lock flush 100 unit/mL, 500 Units, Intracatheter, Once PRN, Sindy Guadeloupe, MD  Physical exam:  Vitals:   02/13/22 1008  BP: (!) 133/59  Pulse: 88  Resp: 18  Temp: 97.6 F (36.4 C)  SpO2: 99%  Weight: 167 lb 4.8 oz (75.9 kg)   Physical Exam Constitutional:      General: She is not  in acute distress. Cardiovascular:     Rate and Rhythm: Normal rate and regular rhythm.     Heart sounds: Normal heart sounds.  Pulmonary:     Effort: Pulmonary effort is normal.     Breath sounds: Normal breath sounds.  Abdominal:     General: Bowel sounds are normal.     Palpations: Abdomen is soft.  Skin:    General: Skin is warm and dry.  Neurological:     Mental Status: She is alert and oriented to person, place, and time.     CMP Latest Ref Rng & Units 02/13/2022  Glucose 70 - 99 mg/dL 136(H)  BUN 8 - 23 mg/dL 15  Creatinine 0.44 - 1.00 mg/dL 1.06(H)  Sodium 135 - 145 mmol/L 136  Potassium 3.5 - 5.1 mmol/L 5.3(H)  Chloride 98 - 111 mmol/L 111  CO2 22 - 32 mmol/L 22  Calcium 8.9 - 10.3 mg/dL 7.9(L)  Total Protein 6.5 - 8.1 g/dL 7.5  Total Bilirubin 0.3 - 1.2 mg/dL 0.5  Alkaline Phos 38 - 126 U/L 214(H)  AST 15 - 41 U/L 29  ALT 0 - 44 U/L 19   CBC Latest Ref Rng & Units 02/13/2022  WBC 4.0 - 10.5 K/uL 6.9  Hemoglobin 12.0 - 15.0 g/dL 8.2(L)  Hematocrit 36.0 - 46.0 % 27.2(L)  Platelets 150 - 400 K/uL 103(L)     Assessment and plan- Patient is a 61 y.o. female  with stage IV intrahepatic cholangiocarcinoma.  She is here for on treatment assessment prior to cycle 9-day 15 of gemcitabine carboplatin chemotherapy  Hemoglobin has been stable around 8.  Platelet count is 103 today but given that it is more than 100 okay to proceed with  carboplatin gemcitabine chemotherapy today.  She will directly proceed for cycle 10-day 1 of treatment in 2 weeks and I will see her back in 4 weeks for cycle 10-day 15 of gem carbo chemotherapy.  Chemo-induced anemia: There is a component of iron deficiency to it as well and she has been receiving IV iron and will receive her dose 3 today.   Visit Diagnosis 1. Iron deficiency anemia, unspecified iron deficiency anemia type   2. Intrahepatic cholangiocarcinoma ([place])   3. Antineoplastic chemotherapy induced anemia      Dr. Randa Evens, MD, MPH [facility] at [facility] [phone] 02/13/2022 1:02 PM

## 2022-02-13 NOTE — Patient Instructions (Signed)
[facility] AT [place]-[facility]ICAL ONCOLOGY  Discharge Instructions: ?Thank you for choosing [facility] to provide your oncology and hematology care.  ?If you have a lab appointment with the Cancer Center, please go directly to the Cancer Center and check in at the registration area. ? ?Wear comfortable clothing and clothing appropriate for easy access to any Portacath or PICC line.  ? ?We strive to give you quality time with your provider. You may need to reschedule your appointment if you arrive late (15 or more minutes).  Arriving late affects you and other patients whose appointments are after yours.  Also, if you miss three or more appointments without notifying the office, you may be dismissed from the clinic at the provider?s discretion.    ?  ?For prescription refill requests, have your pharmacy contact our office and allow 72 hours for refills to be completed.   ? ?  ?To help prevent nausea and vomiting after your treatment, we encourage you to take your nausea medication as directed. ? ?BELOW ARE SYMPTOMS THAT SHOULD BE REPORTED IMMEDIATELY: ?*FEVER GREATER THAN 100.4 F (38 ?C) OR HIGHER ?*CHILLS OR SWEATING ?*NAUSEA AND VOMITING THAT IS NOT CONTROLLED WITH YOUR NAUSEA MEDICATION ?*UNUSUAL SHORTNESS OF BREATH ?*UNUSUAL BRUISING OR BLEEDING ?*URINARY PROBLEMS (pain or burning when urinating, or frequent urination) ?*BOWEL PROBLEMS (unusual diarrhea, constipation, pain near the anus) ?TENDERNESS IN MOUTH AND THROAT WITH OR WITHOUT PRESENCE OF ULCERS (sore throat, sores in mouth, or a toothache) ?UNUSUAL RASH, SWELLING OR PAIN  ?UNUSUAL VAGINAL DISCHARGE OR ITCHING  ? ?Items with * indicate a potential emergency and should be followed up as soon as possible or go to the Emergency Department if any problems should occur. ? ?Please show the CHEMOTHERAPY ALERT CARD or IMMUNOTHERAPY ALERT CARD at check-in to the Emergency Department and triage nurse. ? ?Should you have questions after your visit  or need to cancel or reschedule your appointment, please contact [facility]  [phone] and follow the prompts.  Office hours are 8:00 a.m. to 4:30 p.m. Monday - Friday. Please note that voicemails left after 4:00 p.m. may not be returned until the following business day.  We are closed weekends and major holidays. You have access to a nurse at all times for urgent questions. Please call the main number to the clinic [phone] and follow the prompts. ? ?For any non-urgent questions, you may also contact your provider using MyChart. We now offer e-Visits for anyone 18 and older to request care online for non-urgent symptoms. For details visit [email or web address]. ?  ?Also download the MyChart app! Go to the app store, search "MyChart", open the app, select [place], and log in with your MyChart username and password. ? ?Due to Covid, a mask is required upon entering the hospital/clinic. If you do not have a mask, one will be given to you upon arrival. For doctor visits, patients may have 1 support person aged 18 or older with them. For treatment visits, patients cannot have anyone with them due to current Covid guidelines and our immunocompromised population.  ?

## 2022-02-16 ENCOUNTER — Ambulatory Visit: Payer: Medicare Other

## 2022-02-27 ENCOUNTER — Inpatient Hospital Stay: Payer: Medicare Other

## 2022-02-27 ENCOUNTER — Other Ambulatory Visit: Payer: Medicare Other

## 2022-02-27 ENCOUNTER — Other Ambulatory Visit: Payer: Self-pay

## 2022-02-27 ENCOUNTER — Ambulatory Visit: Payer: Medicare Other

## 2022-02-27 VITALS — BP 121/64 | HR 91 | Temp 98.4°F | Resp 16 | Wt 166.7 lb

## 2022-02-27 DIAGNOSIS — C221 Intrahepatic bile duct carcinoma: Secondary | ICD-10-CM

## 2022-02-27 DIAGNOSIS — Z5111 Encounter for antineoplastic chemotherapy: Secondary | ICD-10-CM | POA: Diagnosis not present

## 2022-02-27 LAB — CBC WITH DIFFERENTIAL/PLATELET
Abs Immature Granulocytes: 0.02 10*3/uL (ref 0.00–0.07)
Basophils Absolute: 0 10*3/uL (ref 0.0–0.1)
Basophils Relative: 1 %
Eosinophils Absolute: 0.3 10*3/uL (ref 0.0–0.5)
Eosinophils Relative: 5 %
HCT: 29.1 % — ABNORMAL LOW (ref 36.0–46.0)
Hemoglobin: 8.7 g/dL — ABNORMAL LOW (ref 12.0–15.0)
Immature Granulocytes: 0 %
Lymphocytes Relative: 23 %
Lymphs Abs: 1.5 10*3/uL (ref 0.7–4.0)
MCH: 28.5 pg (ref 26.0–34.0)
MCHC: 29.9 g/dL — ABNORMAL LOW (ref 30.0–36.0)
MCV: 95.4 fL (ref 80.0–100.0)
Monocytes Absolute: 1 10*3/uL (ref 0.1–1.0)
Monocytes Relative: 15 %
Neutro Abs: 3.7 10*3/uL (ref 1.7–7.7)
Neutrophils Relative %: 56 %
Platelets: 123 10*3/uL — ABNORMAL LOW (ref 150–400)
RBC: 3.05 MIL/uL — ABNORMAL LOW (ref 3.87–5.11)
RDW: 24.5 % — ABNORMAL HIGH (ref 11.5–15.5)
WBC: 6.5 10*3/uL (ref 4.0–10.5)
nRBC: 0 % (ref 0.0–0.2)

## 2022-02-27 LAB — COMPREHENSIVE METABOLIC PANEL
ALT: 19 U/L (ref 0–44)
AST: 29 U/L (ref 15–41)
Albumin: 3 g/dL — ABNORMAL LOW (ref 3.5–5.0)
Alkaline Phosphatase: 258 U/L — ABNORMAL HIGH (ref 38–126)
Anion gap: 4 — ABNORMAL LOW (ref 5–15)
BUN: 16 mg/dL (ref 8–23)
CO2: 20 mmol/L — ABNORMAL LOW (ref 22–32)
Calcium: 8.1 mg/dL — ABNORMAL LOW (ref 8.9–10.3)
Chloride: 115 mmol/L — ABNORMAL HIGH (ref 98–111)
Creatinine, Ser: 1.05 mg/dL — ABNORMAL HIGH (ref 0.44–1.00)
GFR, Estimated: 60 mL/min — ABNORMAL LOW (ref >60)
Glucose, Bld: 203 mg/dL — ABNORMAL HIGH (ref 70–99)
Potassium: 5 mmol/L (ref 3.5–5.1)
Sodium: 139 mmol/L (ref 135–145)
Total Bilirubin: 0.4 mg/dL (ref 0.3–1.2)
Total Protein: 7.7 g/dL (ref 6.5–8.1)

## 2022-02-27 MED ORDER — IRON SUCROSE 20 MG/ML IV SOLN
200.0000 mg | Freq: Once | INTRAVENOUS | Status: AC
  Administered 2022-02-27: 200 mg via INTRAVENOUS
  Filled 2022-02-27: qty 10

## 2022-02-27 MED ORDER — SODIUM CHLORIDE 0.9 % IV SOLN
Freq: Once | INTRAVENOUS | Status: AC
  Filled 2022-02-27: qty 250

## 2022-02-27 MED ORDER — SODIUM CHLORIDE 0.9 % IV SOLN
1400.0000 mg | Freq: Once | INTRAVENOUS | Status: AC
  Administered 2022-02-27: 1400 mg via INTRAVENOUS
  Filled 2022-02-27: qty 26.3

## 2022-02-27 MED ORDER — SODIUM CHLORIDE 0.9 % IV SOLN
10.0000 mg | Freq: Once | INTRAVENOUS | Status: AC
  Administered 2022-02-27: 10 mg via INTRAVENOUS
  Filled 2022-02-27: qty 10

## 2022-02-27 MED ORDER — SODIUM CHLORIDE 0.9 % IV SOLN
126.0000 mg | Freq: Once | INTRAVENOUS | Status: AC
  Administered 2022-02-27: 130 mg via INTRAVENOUS
  Filled 2022-02-27: qty 13

## 2022-02-27 MED ORDER — FAMOTIDINE IN NACL 20-0.9 MG/50ML-% IV SOLN
20.0000 mg | Freq: Once | INTRAVENOUS | Status: AC
  Administered 2022-02-27: 20 mg via INTRAVENOUS
  Filled 2022-02-27: qty 50

## 2022-02-27 MED ORDER — DIPHENHYDRAMINE HCL 50 MG/ML IJ SOLN
50.0000 mg | Freq: Once | INTRAMUSCULAR | Status: AC
  Administered 2022-02-27: 50 mg via INTRAVENOUS
  Filled 2022-02-27: qty 1

## 2022-02-27 MED ORDER — PALONOSETRON HCL INJECTION 0.25 MG/5ML
0.2500 mg | Freq: Once | INTRAVENOUS | Status: AC
  Administered 2022-02-27: 0.25 mg via INTRAVENOUS
  Filled 2022-02-27: qty 5

## 2022-02-27 MED ORDER — SODIUM CHLORIDE 0.9 % IV SOLN: 200.0000 mg | INTRAVENOUS | Status: DC

## 2022-02-27 MED ORDER — HEPARIN SOD (PORK) LOCK FLUSH 100 UNIT/ML IV SOLN
500.0000 [IU] | Freq: Once | INTRAVENOUS | Status: AC | PRN
  Administered 2022-02-27: 500 [IU]
  Filled 2022-02-27: qty 5

## 2022-02-27 NOTE — Progress Notes (Signed)
Patient has concerns about a bruise and a knot on her right arm that presented itself "a couple of days after the last time I was here". Patient is concerned that it may be related to the treatment. Dr. Janese Banks made aware. Per Dr. Janese Banks, proceed with treatment. Nelwyn Salisbury, PA to see patient at chairside.  ?

## 2022-02-27 NOTE — Patient Instructions (Signed)
[facility] AT [place]  Discharge Instructions: ?Thank you for choosing [facility] to provide your oncology and hematology care.  ?If you have a lab appointment with the [facility], please go directly to the [facility] and check in at the registration area. ? ?Wear comfortable clothing and clothing appropriate for easy access to any Portacath or PICC line.  ? ?We strive to give you quality time with your provider. You may need to reschedule your appointment if you arrive late (15 or more minutes).  Arriving late affects you and other patients whose appointments are after yours.  Also, if you miss three or more appointments without notifying the office, you may be dismissed from the clinic at the provider?s discretion.    ?  ?For prescription refill requests, have your pharmacy contact our office and allow 72 hours for refills to be completed.   ? ?Today you received the following chemotherapy and/or immunotherapy agents Gemzar & Carboplatin    ?  ?To help prevent nausea and vomiting after your treatment, we encourage you to take your nausea medication as directed. ? ?BELOW ARE SYMPTOMS THAT SHOULD BE REPORTED IMMEDIATELY: ?*FEVER GREATER THAN 100.4 F (38 ?C) OR HIGHER ?*CHILLS OR SWEATING ?*NAUSEA AND VOMITING THAT IS NOT CONTROLLED WITH YOUR NAUSEA MEDICATION ?*UNUSUAL SHORTNESS OF BREATH ?*UNUSUAL BRUISING OR BLEEDING ?*URINARY PROBLEMS (pain or burning when urinating, or frequent urination) ?*BOWEL PROBLEMS (unusual diarrhea, constipation, pain near the anus) ?TENDERNESS IN MOUTH AND THROAT WITH OR WITHOUT PRESENCE OF ULCERS (sore throat, sores in mouth, or a toothache) ?UNUSUAL RASH, SWELLING OR PAIN  ?UNUSUAL VAGINAL DISCHARGE OR ITCHING  ? ?Items with * indicate a potential emergency and should be followed up as soon as possible or go to the Emergency Department if any problems should occur. ? ?Please show the CHEMOTHERAPY ALERT CARD or IMMUNOTHERAPY ALERT CARD at  check-in to the Emergency Department and triage nurse. ? ?Should you have questions after your visit or need to cancel or reschedule your appointment, please contact [facility] CANCER [place] AT [place]  [phone] and follow the prompts.  Office hours are 8:00 a.m. to 4:30 p.m. Monday - Friday. Please note that voicemails left after 4:00 p.m. may not be returned until the following business day.  We are closed weekends and major holidays. You have access to a nurse at all times for urgent questions. Please call the main number to the clinic [phone] and follow the prompts. ? ?For any non-urgent questions, you may also contact your provider using MyChart. We now offer e-Visits for anyone 86 and older to request care online for non-urgent symptoms. For details visit [email or web address]. ?  ?Also download the MyChart app! Go to the app store, search "MyChart", open the app, select [place], and log in with your MyChart username and password. ? ?Due to Covid, a mask is required upon entering the hospital/clinic. If you do not have a mask, one will be given to you upon arrival. For doctor visits, patients may have 1 support person aged 41 or older with them. For treatment visits, patients cannot have anyone with them due to current Covid guidelines and our immunocompromised population.  ?

## 2022-03-03 ENCOUNTER — Other Ambulatory Visit: Payer: Self-pay | Admitting: *Deleted

## 2022-03-03 DIAGNOSIS — C221 Intrahepatic bile duct carcinoma: Secondary | ICD-10-CM

## 2022-03-11 ENCOUNTER — Encounter: Payer: Self-pay | Admitting: Emergency Medicine

## 2022-03-11 ENCOUNTER — Emergency Department: Payer: Medicare Other

## 2022-03-11 ENCOUNTER — Other Ambulatory Visit: Payer: Self-pay

## 2022-03-11 ENCOUNTER — Emergency Department
Admission: EM | Admit: 2022-03-11 | Discharge: 2022-03-11 | Disposition: A | Discharge status: Home / Self Care | Payer: Medicare Other | Attending: Emergency Medicine | Admitting: Emergency Medicine

## 2022-03-11 DIAGNOSIS — E119 Type 2 diabetes mellitus without complications: Secondary | ICD-10-CM | POA: Diagnosis not present

## 2022-03-11 DIAGNOSIS — S6391XA Sprain of unspecified part of right wrist and hand, initial encounter: Secondary | ICD-10-CM | POA: Diagnosis not present

## 2022-03-11 DIAGNOSIS — S6991XA Unspecified injury of right wrist, hand and finger(s), initial encounter: Secondary | ICD-10-CM | POA: Diagnosis present

## 2022-03-11 DIAGNOSIS — S5011XA Contusion of right forearm, initial encounter: Secondary | ICD-10-CM | POA: Diagnosis not present

## 2022-03-11 DIAGNOSIS — Y92410 Unspecified street and highway as the place of occurrence of the external cause: Secondary | ICD-10-CM | POA: Diagnosis not present

## 2022-03-11 MED ORDER — OXYCODONE-ACETAMINOPHEN 5-325 MG PO TABS
1.0000 | ORAL_TABLET | Freq: Once | ORAL | Status: AC
  Administered 2022-03-11: 1 via ORAL
  Filled 2022-03-11: qty 1

## 2022-03-11 MED ORDER — OXYCODONE-ACETAMINOPHEN 5-325 MG PO TABS: 1.0000 | ORAL_TABLET | ORAL | 0 refills | Status: DC | PRN

## 2022-03-11 NOTE — Discharge Instructions (Addendum)
Follow-up with orthopedics.  Please call for an appointment.  Wear the splint until seen by orthopedics.  You will need a second x-ray due to the amount of swelling on your hand.  Sometimes a fracture will not show due to the amount of soft tissue swelling.  Keep it elevated and iced.  Take pain medication as needed. ?
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
## 2022-03-13 ENCOUNTER — Inpatient Hospital Stay: Payer: Medicare Other | Admitting: Oncology

## 2022-03-13 ENCOUNTER — Telehealth: Payer: Self-pay | Admitting: Oncology

## 2022-03-13 ENCOUNTER — Ambulatory Visit: Payer: Medicare Other

## 2022-03-13 ENCOUNTER — Inpatient Hospital Stay: Payer: Medicare Other

## 2022-03-13 ENCOUNTER — Inpatient Hospital Stay: Payer: Medicare Other | Attending: Nurse Practitioner

## 2022-03-13 DIAGNOSIS — I10 Essential (primary) hypertension: Secondary | ICD-10-CM | POA: Insufficient documentation

## 2022-03-13 DIAGNOSIS — N179 Acute kidney failure, unspecified: Secondary | ICD-10-CM | POA: Insufficient documentation

## 2022-03-13 DIAGNOSIS — E119 Type 2 diabetes mellitus without complications: Secondary | ICD-10-CM | POA: Insufficient documentation

## 2022-03-13 DIAGNOSIS — F17210 Nicotine dependence, cigarettes, uncomplicated: Secondary | ICD-10-CM | POA: Insufficient documentation

## 2022-03-13 DIAGNOSIS — Z5111 Encounter for antineoplastic chemotherapy: Secondary | ICD-10-CM | POA: Insufficient documentation

## 2022-03-13 DIAGNOSIS — D6481 Anemia due to antineoplastic chemotherapy: Secondary | ICD-10-CM | POA: Insufficient documentation

## 2022-03-13 DIAGNOSIS — Z90710 Acquired absence of both cervix and uterus: Secondary | ICD-10-CM | POA: Insufficient documentation

## 2022-03-13 DIAGNOSIS — C221 Intrahepatic bile duct carcinoma: Secondary | ICD-10-CM | POA: Insufficient documentation
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
## 2022-03-14 ENCOUNTER — Emergency Department: Payer: Medicare Other

## 2022-03-14 ENCOUNTER — Other Ambulatory Visit: Payer: Self-pay

## 2022-03-14 ENCOUNTER — Emergency Department
Admission: EM | Admit: 2022-03-14 | Discharge: 2022-03-14 | Disposition: A | Discharge status: Home / Self Care | Payer: Medicare Other | Attending: Emergency Medicine | Admitting: Emergency Medicine

## 2022-03-14 DIAGNOSIS — Y92410 Unspecified street and highway as the place of occurrence of the external cause: Secondary | ICD-10-CM | POA: Insufficient documentation

## 2022-03-14 DIAGNOSIS — M25531 Pain in right wrist: Secondary | ICD-10-CM

## 2022-03-14 DIAGNOSIS — S4991XA Unspecified injury of right shoulder and upper arm, initial encounter: Secondary | ICD-10-CM | POA: Diagnosis present

## 2022-03-14 DIAGNOSIS — R600 Localized edema: Secondary | ICD-10-CM | POA: Insufficient documentation

## 2022-03-14 DIAGNOSIS — S6991XA Unspecified injury of right wrist, hand and finger(s), initial encounter: Secondary | ICD-10-CM | POA: Insufficient documentation

## 2022-03-14 DIAGNOSIS — G8911 Acute pain due to trauma: Secondary | ICD-10-CM | POA: Insufficient documentation
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
## 2022-03-14 NOTE — Discharge Instructions (Signed)
Call make an appointment Tuesday with your primary care provider who can follow-up your wrist and hand pain.  A Velcro wrist splint was applied to your wrist today and can be worn for support and protection.  You may take this off to take shower and then apply it afterwards.  No fractures noted on today's exam. ?
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
## 2022-03-23 ENCOUNTER — Other Ambulatory Visit: Payer: Self-pay | Admitting: Family Medicine

## 2022-03-23 DIAGNOSIS — G8929 Other chronic pain: Secondary | ICD-10-CM

## 2022-03-23 DIAGNOSIS — M4807 Spinal stenosis, lumbosacral region: Secondary | ICD-10-CM

## 2022-03-23 DIAGNOSIS — F331 Major depressive disorder, recurrent, moderate: Secondary | ICD-10-CM

## 2022-03-23 DIAGNOSIS — E1142 Type 2 diabetes mellitus with diabetic polyneuropathy: Secondary | ICD-10-CM

## 2022-03-24 ENCOUNTER — Other Ambulatory Visit: Payer: Self-pay | Admitting: *Deleted

## 2022-03-24 ENCOUNTER — Telehealth: Payer: Self-pay | Admitting: *Deleted

## 2022-03-24 DIAGNOSIS — E1169 Type 2 diabetes mellitus with other specified complication: Secondary | ICD-10-CM

## 2022-03-24 MED ORDER — ATORVASTATIN CALCIUM 40 MG PO TABS: 40.0000 mg | ORAL_TABLET | Freq: Every day | ORAL | 1 refills | Status: DC
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
## 2022-03-27 ENCOUNTER — Inpatient Hospital Stay (HOSPITAL_BASED_OUTPATIENT_CLINIC_OR_DEPARTMENT_OTHER): Payer: Medicare Other | Admitting: Oncology

## 2022-03-27 ENCOUNTER — Inpatient Hospital Stay: Payer: Medicare Other

## 2022-03-27 ENCOUNTER — Other Ambulatory Visit: Payer: Self-pay | Admitting: Family Medicine

## 2022-03-27 ENCOUNTER — Encounter: Payer: Self-pay | Admitting: Oncology

## 2022-03-27 VITALS — BP 131/67 | HR 76 | Temp 97.2°F | Resp 16 | Wt 158.3 lb

## 2022-03-27 DIAGNOSIS — D6481 Anemia due to antineoplastic chemotherapy: Secondary | ICD-10-CM

## 2022-03-27 DIAGNOSIS — C221 Intrahepatic bile duct carcinoma: Secondary | ICD-10-CM | POA: Diagnosis present

## 2022-03-27 DIAGNOSIS — I10 Essential (primary) hypertension: Secondary | ICD-10-CM

## 2022-03-27 DIAGNOSIS — D509 Iron deficiency anemia, unspecified: Secondary | ICD-10-CM

## 2022-03-27 DIAGNOSIS — F17210 Nicotine dependence, cigarettes, uncomplicated: Secondary | ICD-10-CM | POA: Diagnosis not present

## 2022-03-27 DIAGNOSIS — N179 Acute kidney failure, unspecified: Secondary | ICD-10-CM

## 2022-03-27 DIAGNOSIS — T451X5A Adverse effect of antineoplastic and immunosuppressive drugs, initial encounter: Secondary | ICD-10-CM

## 2022-03-27 DIAGNOSIS — Z5111 Encounter for antineoplastic chemotherapy: Secondary | ICD-10-CM | POA: Diagnosis present

## 2022-03-27 DIAGNOSIS — Z90710 Acquired absence of both cervix and uterus: Secondary | ICD-10-CM

## 2022-03-27 DIAGNOSIS — E119 Type 2 diabetes mellitus without complications: Secondary | ICD-10-CM

## 2022-03-27 DIAGNOSIS — E118 Type 2 diabetes mellitus with unspecified complications: Secondary | ICD-10-CM

## 2022-03-27 LAB — CBC WITH DIFFERENTIAL/PLATELET
Abs Immature Granulocytes: 0.02 10*3/uL (ref 0.00–0.07)
Basophils Absolute: 0.1 10*3/uL (ref 0.0–0.1)
Basophils Relative: 1 %
Eosinophils Absolute: 0.2 10*3/uL (ref 0.0–0.5)
Eosinophils Relative: 4 %
HCT: 33.6 % — ABNORMAL LOW (ref 36.0–46.0)
Hemoglobin: 10.6 g/dL — ABNORMAL LOW (ref 12.0–15.0)
Immature Granulocytes: 0 %
Lymphocytes Relative: 23 %
Lymphs Abs: 1.4 10*3/uL (ref 0.7–4.0)
MCH: 30.1 pg (ref 26.0–34.0)
MCHC: 31.5 g/dL (ref 30.0–36.0)
MCV: 95.5 fL (ref 80.0–100.0)
Monocytes Absolute: 0.6 10*3/uL (ref 0.1–1.0)
Monocytes Relative: 11 %
Neutro Abs: 3.6 10*3/uL (ref 1.7–7.7)
Neutrophils Relative %: 61 %
Platelets: 116 10*3/uL — ABNORMAL LOW (ref 150–400)
RBC: 3.52 MIL/uL — ABNORMAL LOW (ref 3.87–5.11)
RDW: 24.5 % — ABNORMAL HIGH (ref 11.5–15.5)
WBC: 5.9 10*3/uL (ref 4.0–10.5)
nRBC: 0 % (ref 0.0–0.2)

## 2022-03-27 LAB — COMPREHENSIVE METABOLIC PANEL
ALT: 22 U/L (ref 0–44)
AST: 36 U/L (ref 15–41)
Albumin: 3 g/dL — ABNORMAL LOW (ref 3.5–5.0)
Alkaline Phosphatase: 286 U/L — ABNORMAL HIGH (ref 38–126)
Anion gap: 4 — ABNORMAL LOW (ref 5–15)
BUN: 21 mg/dL (ref 8–23)
CO2: 23 mmol/L (ref 22–32)
Calcium: 8 mg/dL — ABNORMAL LOW (ref 8.9–10.3)
Chloride: 110 mmol/L (ref 98–111)
Creatinine, Ser: 1.25 mg/dL — ABNORMAL HIGH (ref 0.44–1.00)
GFR, Estimated: 48 mL/min — ABNORMAL LOW (ref >60)
Glucose, Bld: 268 mg/dL — ABNORMAL HIGH (ref 70–99)
Potassium: 4.4 mmol/L (ref 3.5–5.1)
Sodium: 137 mmol/L (ref 135–145)
Total Bilirubin: 0.8 mg/dL (ref 0.3–1.2)
Total Protein: 7.8 g/dL (ref 6.5–8.1)

## 2022-03-27 MED ORDER — FAMOTIDINE IN NACL 20-0.9 MG/50ML-% IV SOLN
20.0000 mg | Freq: Once | INTRAVENOUS | Status: AC
  Administered 2022-03-27: 20 mg via INTRAVENOUS
  Filled 2022-03-27: qty 50

## 2022-03-27 MED ORDER — IRON SUCROSE 20 MG/ML IV SOLN
200.0000 mg | Freq: Once | INTRAVENOUS | Status: AC
  Administered 2022-03-27: 200 mg via INTRAVENOUS
  Filled 2022-03-27: qty 10

## 2022-03-27 MED ORDER — TRESIBA FLEXTOUCH 100 UNIT/ML ~~LOC~~ SOPN: PEN_INJECTOR | SUBCUTANEOUS | 1 refills | Status: DC

## 2022-03-27 MED ORDER — SODIUM CHLORIDE 0.9 % IV SOLN
Freq: Once | INTRAVENOUS | Status: AC
  Filled 2022-03-27: qty 250

## 2022-03-27 MED ORDER — SODIUM CHLORIDE 0.9 % IV SOLN: 126.0000 mg | Freq: Once | INTRAVENOUS | Status: DC

## 2022-03-27 MED ORDER — HEPARIN SOD (PORK) LOCK FLUSH 100 UNIT/ML IV SOLN
INTRAVENOUS | Status: AC
  Administered 2022-03-27: 500 [IU]
  Filled 2022-03-27: qty 5

## 2022-03-27 MED ORDER — SODIUM CHLORIDE 0.9 % IV SOLN
10.0000 mg | Freq: Once | INTRAVENOUS | Status: AC
  Administered 2022-03-27: 10 mg via INTRAVENOUS
  Filled 2022-03-27: qty 1
  Filled 2022-03-27: qty 10

## 2022-03-27 MED ORDER — DIPHENHYDRAMINE HCL 50 MG/ML IJ SOLN
50.0000 mg | Freq: Once | INTRAMUSCULAR | Status: AC
  Administered 2022-03-27: 50 mg via INTRAVENOUS
  Filled 2022-03-27: qty 1

## 2022-03-27 MED ORDER — PALONOSETRON HCL INJECTION 0.25 MG/5ML
0.2500 mg | Freq: Once | INTRAVENOUS | Status: AC
  Administered 2022-03-27: 0.25 mg via INTRAVENOUS

## 2022-03-27 MED ORDER — SODIUM CHLORIDE 0.9 % IV SOLN
1400.0000 mg | Freq: Once | INTRAVENOUS | Status: AC
  Administered 2022-03-27: 1400 mg via INTRAVENOUS
  Filled 2022-03-27: qty 26.3

## 2022-03-27 MED ORDER — SODIUM CHLORIDE 0.9 % IV SOLN
115.0000 mg | Freq: Once | INTRAVENOUS | Status: AC
  Administered 2022-03-27: 120 mg via INTRAVENOUS
  Filled 2022-03-27: qty 12

## 2022-03-27 MED ORDER — SODIUM CHLORIDE 0.9 % IV SOLN: 200.0000 mg | INTRAVENOUS | Status: DC

## 2022-03-27 MED ORDER — HEPARIN SOD (PORK) LOCK FLUSH 100 UNIT/ML IV SOLN
500.0000 [IU] | Freq: Once | INTRAVENOUS | Status: AC | PRN
  Filled 2022-03-27: qty 5

## 2022-03-27 NOTE — Progress Notes (Signed)
? ? ? ?Hematology/Oncology Consult note ?[place]  ?[phone] [phone] ? ?Patient Care Team: ?Steele Sizer, MD as PCP - General (Family Medicine) ?Clent Jacks, RN as Oncology Nurse Navigator ?Land, Hunter, [place] as Education officer, museum ?Germaine Pomfret, [facility] (Pharmacist) ?Sindy Guadeloupe, MD as Consulting Physician (Oncology) ?Borders, Kirt Boys, NP as Nurse Practitioner [facility] and Palliative Medicine)  ? ?Name of the patient: Janice Short  ?[id number]  ?December 31, 1960  ? ?Date of visit: 03/27/22 ? ?Diagnosis- metastatic intrahepatic cholangiocarcinoma ? ?Chief complaint/ Reason for visit-on treatment assessment prior to cycle 10-day 1 of carboplatin gemcitabine chemotherapy ? ?Heme/Onc history: Patient is a 61 year old African-American female with a past medical history significant for hypertension hyperlipidemia and type 2 diabetes who presented to the ER with symptoms of cramping abdominal pain and nausea.  This was followed by right upper quadrant ultrasound which showed multiple echogenic masses in the liver with nodular contour of the liver possibly suggestive of cirrhosis.  Several nodular masses in the vicinity of the pancreatic head and porta hepatic lymph nodes.  This was followed by an MRI abdomen and MRCP.  It showed right hepatic lobe masslike area measuring 4.9 x 2.9 cm.  Diffusion abnormality in the left hepatic lobe with mild left-sided biliary ductal dilatation and another area of abnormality measuring 1.4 x 1 cm.  Bulky celiac adenopathy 3 x 2.1 cm tracking into gastrohepatic recess.  Suspected portal venous invasion with convex protrusion in the right portal vein.  Left adrenal lesion measuring 2.9 x 1.5 cm.  Findings concerning for cholangiocarcinoma or biphenotypic cholangiole hepatocellular neoplasm.   ?  ?Patient underwent CT-guided liver biopsy Which was consistent with adenocarcinoma poorly differentiated.  Immunohistochemistry showed CK7  positivity, CDX2 negative, Heppar negative, GATA3 negative.  CK19 positive.  Differentials include cholangiocarcinoma, pancreatic, upper GI.  Lung and breast less likely.  However given CK19 positivity most compatible with cholangiocarcinoma. ?  ?Patient's case was discussed at tumor board and it was felt as if the lymphadenopathy was more like a confluent primary tumor mass.  Patient was referred to [facility] for second opinion to see if she would be a candidate for surgery.However upon their review of the PET scan it was deemed that this was indeed periportal adenopathy.  They also did a CT chest abdomen and pelvis with MIPS protocol and she was also found to have portocaval lymph node measuring 2.4 cm which was more consistent with metastatic disease.  She was deemed unresectable ?  ?NGS testing showed BRCA2, FGFR2, PIK3CA, RAD 54L, CDC73, CDKN2 A/B CR EBBP rearrangement exon 31 ?   ?Patient has been getting gemcitabine cisplatin chemotherapy 2 weeks on 1 week off since August 2022.  Patient had an allergic reaction to cisplatin and therefore was switched to carboplatin gemcitabine chemotherapy which she gets week on week off ?  ? ?Interval history- tolerating chemotherapy well.  Denies any abdominal pain.  Bowel movements are regular.  Appetite is fair but she has lost 3 pounds since February.  Unclear if her weight of 167 pounds in March was accurate. ? ?ECOG PS- 1 ?Pain scale- 0 ?Opioid associated constipation- no ? ?Review of systems- Review of Systems  ?Constitutional:  Positive for malaise/fatigue. Negative for chills, fever and weight loss.  ?HENT:  Negative for congestion, ear discharge and nosebleeds.   ?Eyes:  Negative for blurred vision.  ?Respiratory:  Negative for cough, hemoptysis, sputum production, shortness of breath and wheezing.   ?Cardiovascular:  Negative for chest pain,  palpitations, orthopnea and claudication.  ?Gastrointestinal:  Negative for abdominal pain, blood in stool, constipation,  diarrhea, heartburn, melena, nausea and vomiting.  ?Genitourinary:  Negative for dysuria, flank pain, frequency, hematuria and urgency.  ?Musculoskeletal:  Negative for back pain, joint pain and myalgias.  ?Skin:  Negative for rash.  ?Neurological:  Negative for dizziness, tingling, focal weakness, seizures, weakness and headaches.  ?Endo/Heme/Allergies:  Does not bruise/bleed easily.  ?Psychiatric/Behavioral:  Negative for depression and suicidal ideas. The patient does not have insomnia.    ? ? ? ?Allergies  ?Allergen Reactions  ? Other Shortness Of Breath  ?  Cat dander and grass pollen  ? Cisplatin Itching  ? Bydureon [Exenatide] Other (See Comments)  ?  Pain with injection not allergy  ? ? ? ?Past Medical History:  ?Diagnosis Date  ? Allergy   ? Asthma   ? Carpal tunnel syndrome on left   ? Cervical radiculitis   ? Chronic osteoarthritis   ? Corns and callosity   ? Depression   ? Dermatophytosis of foot   ? Diabetes mellitus without complication ([place])   ? Gout   ? Hyperlipidemia   ? Hypertension   ? Impingement syndrome of left shoulder   ? Intrahepatic cholangiocarcinoma ([place])   ? Lumbosacral neuritis   ? Microalbuminuria   ? Obesity   ? Supraventricular tachycardia ([place]) 07/01/2015  ? SVT (supraventricular tachycardia) ([place])   ? Tenosynovitis of wrist   ? Vitamin D deficiency   ? ? ? ?Past Surgical History:  ?Procedure Laterality Date  ? ABDOMINAL HYSTERECTOMY    ? COLONOSCOPY WITH PROPOFOL N/A 05/06/2021  ? Procedure: COLONOSCOPY WITH PROPOFOL;  Surgeon: Lucilla Lame, MD;  Location[facility] ENDOSCOPY;  Service: Endoscopy;  Laterality: N/A;  ? ESOPHAGOGASTRODUODENOSCOPY (EGD) WITH PROPOFOL N/A 05/06/2021  ? Procedure: ESOPHAGOGASTRODUODENOSCOPY (EGD) WITH PROPOFOL;  Surgeon: Lucilla Lame, MD;  Location[facility] ENDOSCOPY;  Service: Endoscopy;  Laterality: N/A;  ? PORTA CATH INSERTION N/A 07/17/2021  ? Procedure: PORTA CATH INSERTION;  Surgeon: Algernon Huxley, MD;  Location: [facility];  Service: Cardiovascular;   Laterality: N/A;  ? ? ?Social History  ? ?Socioeconomic History  ? Marital status: Single  ?  Spouse name: Not on file  ? Number of children: 2  ? Years of education: Not on file  ? Highest education level: Not on file  ?Occupational History  ? Occupation: disable   ?  Comment: from chronic back pain, 2019  ?Tobacco Use  ? Smoking status: Some Days  ?  Types: Cigarettes  ? Smokeless tobacco: Never  ? Tobacco comments:  ?  1 per day 01/30/21 does not inhale it  ?Vaping Use  ? Vaping Use: Never used  ?Substance and Sexual Activity  ? Alcohol use: Not Currently  ? Drug use: No  ? Sexual activity: Not Currently  ?Other Topics Concern  ? Not on file  ?Social History Narrative  ? She is on disability for chronic back pain. Pt's son lives with her  ? ?Social Determinants of Health  ? ?Financial Resource Strain: Medium Risk  ? Difficulty of Paying Living Expenses: Somewhat hard  ?Food Insecurity: Food Insecurity Present  ? Worried About Charity fundraiser in the Last Year: Sometimes true  ? Ran Out of Food in the Last Year: Never true  ?Transportation Needs: No Transportation Needs  ? Lack of Transportation (Medical): No  ? Lack of Transportation (Non-Medical): No  ?Physical Activity: Inactive  ? Days of Exercise per Week: 0  days  ? Minutes of Exercise per Session: 0 min  ?Stress: No Stress Concern Present  ? Feeling of Stress : Only a little  ?Social Connections: Moderately Isolated  ? Frequency of Communication with Friends and Family: More than three times a week  ? Frequency of Social Gatherings with Friends and Family: Three times a week  ? Attends Religious Services: More than 4 times per year  ? Active Member of Clubs or Organizations: No  ? Attends Archivist Meetings: Never  ? Marital Status: Never married  ?Intimate Partner Violence: Not At Risk  ? Fear of Current or Ex-Partner: No  ? Emotionally Abused: No  ? Physically Abused: No  ? Sexually Abused: No  ? ? ?Family History  ?Problem Relation Age of  Onset  ? Heart attack Mother   ? CAD Mother   ? Kidney disease Father   ? ? ? ?Current Outpatient Medications:  ?  albuterol (VENTOLIN HFA) 108 (90 Base) MCG/ACT inhaler, INHALE 2 PUFFS BY MOUTH EVERY 6 HOURS AS

## 2022-03-27 NOTE — Progress Notes (Signed)
Pt has no new concerns for today. ?

## 2022-03-27 NOTE — Telephone Encounter (Signed)
Medication Refill - Medication: insulin degludec (TRESIBA FLEXTOUCH) 100 UNIT/ML FlexTouch Pen ? ?Has the patient contacted their pharmacy? Yes.   ?([facility] , Jeanne Ivan is calling to request this medication ?Pharmacy phone number or street name): [facility] - [place], [address] ?Has the patient been seen for an appointment in the last year OR does the patient have an upcoming appointment? Yes.   ? ?Agent: Please be advised that RX refills may take up to 3 business days. We ask that you follow-up with your pharmacy. ? ?

## 2022-03-27 NOTE — Telephone Encounter (Signed)
Requested Prescriptions  ?Pending Prescriptions Disp Refills  ?? insulin degludec (TRESIBA FLEXTOUCH) 100 UNIT/ML FlexTouch Pen 15 mL 0  ?  Sig: INJECT 12-30 UNITS OF INSULIN INTO THE SKIN DAILY TO KEEP FASTING GLUCOSE BETWEEN 100-140  ?  ? Endocrinology:  Diabetes - Insulins Passed - 03/27/2022 11:13 AM  ?  ?  Passed - HBA1C is between 0 and 7.9 and within 180 days  ?  Hemoglobin A1C  ?Date Value Ref Range Status  ?12/26/2021 5.4 4.0 - 5.6 % Final  ? ?HbA1c, POC (controlled diabetic range)  ?Date Value Ref Range Status  ?09/01/2019 8.2 (A) 0.0 - 7.0 % Final  ? ?Hgb A1c MFr Bld  ?Date Value Ref Range Status  ?02/16/2020 6.0 (H) <5.7 % of total Hgb Final  ?  Comment:  ?  For someone without known diabetes, a hemoglobin  ?A1c value between 5.7% and 6.4% is consistent with ?prediabetes and should be confirmed with a  ?follow-up test. ?. ?For someone with known diabetes, a value <7% ?indicates that their diabetes is well controlled. A1c ?targets should be individualized based on duration of ?diabetes, age, comorbid conditions, and other ?considerations. ?. ?This assay result is consistent with an increased risk ?of diabetes. ?. ?Currently, no consensus exists regarding use of ?hemoglobin A1c for diagnosis of diabetes for children. ?. ?  ?   ?  ?  Passed - Valid encounter within last 6 months  ?  Recent Outpatient Visits   ?      ? 3 months ago Diabetes mellitus type 2 with complications ([place])  ? [facility], Drue Stager, MD  ? 4 months ago Chronic left shoulder pain  ? [place], DO  ? 6 months ago Diabetes mellitus type 2 with complications [facility])  ? [facility], Drue Stager, MD  ? 11 months ago Diabetes mellitus type 2 with complications [facility])  ? [facility] [place], Drue Stager, MD  ? 1 year ago Uncontrolled type 2 diabetes mellitus with microalbuminuria ([place])  ? [facility] Steele Sizer, MD  ?   ?  ?Future Appointments   ?        ? In 10 months [place]   ?  ? ?  ?  ?  ? ? ?

## 2022-03-27 NOTE — Patient Instructions (Signed)
[facility] AT [place]  Discharge Instructions: ?Thank you for choosing [facility] to provide your oncology and hematology care.  ?If you have a lab appointment with the [facility], please go directly to the [facility] and check in at the registration area. ? ?Wear comfortable clothing and clothing appropriate for easy access to any Portacath or PICC line.  ? ?We strive to give you quality time with your provider. You may need to reschedule your appointment if you arrive late (15 or more minutes).  Arriving late affects you and other patients whose appointments are after yours.  Also, if you miss three or more appointments without notifying the office, you may be dismissed from the clinic at the provider?s discretion.    ?  ?For prescription refill requests, have your pharmacy contact our office and allow 72 hours for refills to be completed.   ? ?Today you received the following chemotherapy and/or immunotherapy agents Gemzar and Carboplatin    ?  ?To help prevent nausea and vomiting after your treatment, we encourage you to take your nausea medication as directed. ? ?BELOW ARE SYMPTOMS THAT SHOULD BE REPORTED IMMEDIATELY: ?*FEVER GREATER THAN 100.4 F (38 ?C) OR HIGHER ?*CHILLS OR SWEATING ?*NAUSEA AND VOMITING THAT IS NOT CONTROLLED WITH YOUR NAUSEA MEDICATION ?*UNUSUAL SHORTNESS OF BREATH ?*UNUSUAL BRUISING OR BLEEDING ?*URINARY PROBLEMS (pain or burning when urinating, or frequent urination) ?*BOWEL PROBLEMS (unusual diarrhea, constipation, pain near the anus) ?TENDERNESS IN MOUTH AND THROAT WITH OR WITHOUT PRESENCE OF ULCERS (sore throat, sores in mouth, or a toothache) ?UNUSUAL RASH, SWELLING OR PAIN  ?UNUSUAL VAGINAL DISCHARGE OR ITCHING  ? ?Items with * indicate a potential emergency and should be followed up as soon as possible or go to the Emergency Department if any problems should occur. ? ?Please show the CHEMOTHERAPY ALERT CARD or IMMUNOTHERAPY ALERT CARD at  check-in to the Emergency Department and triage nurse. ? ?Should you have questions after your visit or need to cancel or reschedule your appointment, please contact [facility] [place] AT [place]  [phone] and follow the prompts.  Office hours are 8:00 a.m. to 4:30 p.m. Monday - Friday. Please note that voicemails left after 4:00 p.m. may not be returned until the following business day.  We are closed weekends and major holidays. You have access to a nurse at all times for urgent questions. Please call the main number to the clinic [phone] and follow the prompts. ? ?For any non-urgent questions, you may also contact your provider using MyChart. We now offer e-Visits for anyone 96 and older to request care online for non-urgent symptoms. For details visit [email or web address]. ?  ?Also download the MyChart app! Go to the app store, search "MyChart", open the app, select [place], and log in with your MyChart username and password. ? ?Due to Covid, a mask is required upon entering the hospital/clinic. If you do not have a mask, one will be given to you upon arrival. For doctor visits, patients may have 1 support person aged 3 or older with them. For treatment visits, patients cannot have anyone with them due to current Covid guidelines and our immunocompromised population.  ?

## 2022-03-31 ENCOUNTER — Other Ambulatory Visit: Payer: Self-pay | Admitting: Family Medicine

## 2022-03-31 DIAGNOSIS — E118 Type 2 diabetes mellitus with unspecified complications: Secondary | ICD-10-CM

## 2022-04-06 ENCOUNTER — Ambulatory Visit: Admission: RE | Admit: 2022-04-06 | Payer: Medicare Other | Source: Ambulatory Visit

## 2022-04-10 ENCOUNTER — Inpatient Hospital Stay: Payer: Medicare Other

## 2022-04-10 ENCOUNTER — Inpatient Hospital Stay (HOSPITAL_BASED_OUTPATIENT_CLINIC_OR_DEPARTMENT_OTHER): Payer: Medicare Other | Admitting: Oncology

## 2022-04-10 ENCOUNTER — Inpatient Hospital Stay: Payer: Medicare Other | Attending: Oncology

## 2022-04-10 ENCOUNTER — Encounter: Payer: Self-pay | Admitting: Oncology

## 2022-04-10 VITALS — BP 120/66 | HR 71 | Temp 97.9°F | Resp 16 | Wt 159.5 lb

## 2022-04-10 DIAGNOSIS — C221 Intrahepatic bile duct carcinoma: Secondary | ICD-10-CM

## 2022-04-10 DIAGNOSIS — E119 Type 2 diabetes mellitus without complications: Secondary | ICD-10-CM | POA: Diagnosis not present

## 2022-04-10 DIAGNOSIS — Z5111 Encounter for antineoplastic chemotherapy: Secondary | ICD-10-CM

## 2022-04-10 DIAGNOSIS — D696 Thrombocytopenia, unspecified: Secondary | ICD-10-CM | POA: Diagnosis not present

## 2022-04-10 DIAGNOSIS — I10 Essential (primary) hypertension: Secondary | ICD-10-CM | POA: Insufficient documentation

## 2022-04-10 DIAGNOSIS — D6481 Anemia due to antineoplastic chemotherapy: Secondary | ICD-10-CM | POA: Diagnosis not present

## 2022-04-10 DIAGNOSIS — F17210 Nicotine dependence, cigarettes, uncomplicated: Secondary | ICD-10-CM | POA: Insufficient documentation

## 2022-04-10 DIAGNOSIS — Z90710 Acquired absence of both cervix and uterus: Secondary | ICD-10-CM | POA: Insufficient documentation

## 2022-04-10 LAB — COMPREHENSIVE METABOLIC PANEL
ALT: 30 U/L (ref 0–44)
AST: 40 U/L (ref 15–41)
Albumin: 3 g/dL — ABNORMAL LOW (ref 3.5–5.0)
Alkaline Phosphatase: 360 U/L — ABNORMAL HIGH (ref 38–126)
Anion gap: 3 — ABNORMAL LOW (ref 5–15)
BUN: 17 mg/dL (ref 8–23)
CO2: 23 mmol/L (ref 22–32)
Calcium: 8.3 mg/dL — ABNORMAL LOW (ref 8.9–10.3)
Chloride: 110 mmol/L (ref 98–111)
Creatinine, Ser: 1.08 mg/dL — ABNORMAL HIGH (ref 0.44–1.00)
GFR, Estimated: 58 mL/min — ABNORMAL LOW (ref >60)
Glucose, Bld: 113 mg/dL — ABNORMAL HIGH (ref 70–99)
Potassium: 4.5 mmol/L (ref 3.5–5.1)
Sodium: 136 mmol/L (ref 135–145)
Total Bilirubin: 1 mg/dL (ref 0.3–1.2)
Total Protein: 7.9 g/dL (ref 6.5–8.1)

## 2022-04-10 LAB — CBC WITH DIFFERENTIAL/PLATELET
Abs Immature Granulocytes: 0.02 10*3/uL (ref 0.00–0.07)
Basophils Absolute: 0 10*3/uL (ref 0.0–0.1)
Basophils Relative: 1 %
Eosinophils Absolute: 0.3 10*3/uL (ref 0.0–0.5)
Eosinophils Relative: 5 %
HCT: 33 % — ABNORMAL LOW (ref 36.0–46.0)
Hemoglobin: 10.6 g/dL — ABNORMAL LOW (ref 12.0–15.0)
Immature Granulocytes: 0 %
Lymphocytes Relative: 29 %
Lymphs Abs: 1.9 10*3/uL (ref 0.7–4.0)
MCH: 30.9 pg (ref 26.0–34.0)
MCHC: 32.1 g/dL (ref 30.0–36.0)
MCV: 96.2 fL (ref 80.0–100.0)
Monocytes Absolute: 0.6 10*3/uL (ref 0.1–1.0)
Monocytes Relative: 9 %
Neutro Abs: 3.6 10*3/uL (ref 1.7–7.7)
Neutrophils Relative %: 56 %
Platelets: 127 10*3/uL — ABNORMAL LOW (ref 150–400)
RBC: 3.43 MIL/uL — ABNORMAL LOW (ref 3.87–5.11)
RDW: 26.4 % — ABNORMAL HIGH (ref 11.5–15.5)
WBC: 6.4 10*3/uL (ref 4.0–10.5)
nRBC: 0 % (ref 0.0–0.2)

## 2022-04-10 MED ORDER — HEPARIN SOD (PORK) LOCK FLUSH 100 UNIT/ML IV SOLN
500.0000 [IU] | Freq: Once | INTRAVENOUS | Status: DC
  Filled 2022-04-10: qty 5

## 2022-04-10 MED ORDER — HEPARIN SOD (PORK) LOCK FLUSH 100 UNIT/ML IV SOLN
INTRAVENOUS | Status: AC
  Administered 2022-04-10: 500 [IU]
  Filled 2022-04-10: qty 5

## 2022-04-10 MED ORDER — SODIUM CHLORIDE 0.9 % IV SOLN
130.0000 mg | Freq: Once | INTRAVENOUS | Status: AC
  Administered 2022-04-10: 130 mg via INTRAVENOUS
  Filled 2022-04-10: qty 13

## 2022-04-10 MED ORDER — SODIUM CHLORIDE 0.9 % IV SOLN
Freq: Once | INTRAVENOUS | Status: AC
  Filled 2022-04-10: qty 250

## 2022-04-10 MED ORDER — SODIUM CHLORIDE 0.9% FLUSH
10.0000 mL | INTRAVENOUS | Status: DC | PRN
  Administered 2022-04-10: 10 mL via INTRAVENOUS
  Filled 2022-04-10: qty 10

## 2022-04-10 MED ORDER — SODIUM CHLORIDE 0.9 % IV SOLN
10.0000 mg | Freq: Once | INTRAVENOUS | Status: AC
  Administered 2022-04-10: 10 mg via INTRAVENOUS
  Filled 2022-04-10: qty 1

## 2022-04-10 MED ORDER — HEPARIN SOD (PORK) LOCK FLUSH 100 UNIT/ML IV SOLN
500.0000 [IU] | Freq: Once | INTRAVENOUS | Status: AC | PRN
  Filled 2022-04-10: qty 5

## 2022-04-10 MED ORDER — DIPHENHYDRAMINE HCL 50 MG/ML IJ SOLN
50.0000 mg | Freq: Once | INTRAMUSCULAR | Status: AC
  Administered 2022-04-10: 50 mg via INTRAVENOUS
  Filled 2022-04-10: qty 1

## 2022-04-10 MED ORDER — SODIUM CHLORIDE 0.9 % IV SOLN
1400.0000 mg | Freq: Once | INTRAVENOUS | Status: AC
  Administered 2022-04-10: 1400 mg via INTRAVENOUS
  Filled 2022-04-10: qty 26.3

## 2022-04-10 MED ORDER — PALONOSETRON HCL INJECTION 0.25 MG/5ML
0.2500 mg | Freq: Once | INTRAVENOUS | Status: AC
  Administered 2022-04-10: 0.25 mg via INTRAVENOUS

## 2022-04-10 MED ORDER — FAMOTIDINE IN NACL 20-0.9 MG/50ML-% IV SOLN
20.0000 mg | Freq: Once | INTRAVENOUS | Status: AC
  Administered 2022-04-10: 20 mg via INTRAVENOUS
  Filled 2022-04-10: qty 50
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
## 2022-04-10 NOTE — Patient Instructions (Signed)
[facility] CANCER CTR AT [place]  Discharge Instructions: ?Thank you for choosing [facility] to provide your oncology and hematology care.  ?If you have a lab appointment with the [facility], please go directly to the [facility] and check in at the registration area. ? ?Wear comfortable clothing and clothing appropriate for easy access to any Portacath or PICC line.  ? ?We strive to give you quality time with your provider. You may need to reschedule your appointment if you arrive late (15 or more minutes).  Arriving late affects you and other patients whose appointments are after yours.  Also, if you miss three or more appointments without notifying the office, you may be dismissed from the clinic at the provider?s discretion.    ?  ?For prescription refill requests, have your pharmacy contact our office and allow 72 hours for refills to be completed.   ? ?Today you received the following chemotherapy and/or immunotherapy agents Gemzar and Carboplatin.  ?    ?  ?To help prevent nausea and vomiting after your treatment, we encourage you to take your nausea medication as directed. ? ?BELOW ARE SYMPTOMS THAT SHOULD BE REPORTED IMMEDIATELY: ?*FEVER GREATER THAN 100.4 F (38 ?C) OR HIGHER ?*CHILLS OR SWEATING ?*NAUSEA AND VOMITING THAT IS NOT CONTROLLED WITH YOUR NAUSEA MEDICATION ?*UNUSUAL SHORTNESS OF BREATH ?*UNUSUAL BRUISING OR BLEEDING ?*URINARY PROBLEMS (pain or burning when urinating, or frequent urination) ?*BOWEL PROBLEMS (unusual diarrhea, constipation, pain near the anus) ?TENDERNESS IN MOUTH AND THROAT WITH OR WITHOUT PRESENCE OF ULCERS (sore throat, sores in mouth, or a toothache) ?UNUSUAL RASH, SWELLING OR PAIN  ?UNUSUAL VAGINAL DISCHARGE OR ITCHING  ? ?Items with * indicate a potential emergency and should be followed up as soon as possible or go to the Emergency Department if any problems should occur. ? ?Please show the CHEMOTHERAPY ALERT CARD or IMMUNOTHERAPY ALERT CARD  at check-in to the Emergency Department and triage nurse. ? ?Should you have questions after your visit or need to cancel or reschedule your appointment, please contact [facility]CER [place] AT [place]  [phone] and follow the prompts.  Office hours are 8:00 a.m. to 4:30 p.m. Monday - Friday. Please note that voicemails left after 4:00 p.m. may not be returned until the following business day.  We are closed weekends and major holidays. You have access to a nurse at all times for urgent questions. Please call the main number to the clinic [phone] and follow the prompts. ? ?For any non-urgent questions, you may also contact your provider using MyChart. We now offer e-Visits for anyone 46 and older to request care online for non-urgent symptoms. For details visit [email or web address]. ?  ?Also download the MyChart app! Go to the app store, search "MyChart", open the app, select [facility], and log in with your MyChart username and password. ? ?Due to Covid, a mask is required upon entering the hospital/clinic. If you do not have a mask, one will be given to you upon arrival. For doctor visits, patients may have 1 support person aged 75 or older with them. For treatment visits, patients cannot have anyone with them due to current Covid guidelines and our immunocompromised population.  ?

## 2022-04-10 NOTE — Progress Notes (Signed)
Alkaline Phosphatase 360, Per Dr. Janese Banks okay to proceed with Gemzar and Carboplatin as scheduled.  ? ?

## 2022-04-23 ENCOUNTER — Other Ambulatory Visit: Payer: Self-pay | Admitting: *Deleted

## 2022-04-23 MED ORDER — LIDOCAINE-PRILOCAINE 2.5-2.5 % EX CREA: 1.0000 "application " | TOPICAL_CREAM | CUTANEOUS | 3 refills | Status: DC | PRN

## 2022-04-23 MED FILL — Dexamethasone Sodium Phosphate Inj 100 MG/10ML: INTRAMUSCULAR | Qty: 1 | Status: AC

## 2022-04-24 ENCOUNTER — Inpatient Hospital Stay: Payer: Medicare Other

## 2022-04-24 ENCOUNTER — Ambulatory Visit
Admission: RE | Admit: 2022-04-24 | Discharge: 2022-04-24 | Disposition: A | Discharge status: Home / Self Care | Payer: Medicare Other | Source: Ambulatory Visit | Attending: Oncology | Admitting: Oncology

## 2022-04-24 ENCOUNTER — Ambulatory Visit: Payer: Medicare Other

## 2022-04-24 VITALS — BP 131/72 | HR 74 | Temp 96.9°F | Resp 17 | Wt 161.6 lb

## 2022-04-24 DIAGNOSIS — C221 Intrahepatic bile duct carcinoma: Secondary | ICD-10-CM | POA: Diagnosis present

## 2022-04-24 DIAGNOSIS — Z5111 Encounter for antineoplastic chemotherapy: Secondary | ICD-10-CM | POA: Diagnosis not present

## 2022-04-24 LAB — CBC WITH DIFFERENTIAL/PLATELET
Abs Immature Granulocytes: 0.02 10*3/uL (ref 0.00–0.07)
Basophils Absolute: 0.1 10*3/uL (ref 0.0–0.1)
Basophils Relative: 1 %
Eosinophils Absolute: 0.2 10*3/uL (ref 0.0–0.5)
Eosinophils Relative: 3 %
HCT: 32.4 % — ABNORMAL LOW (ref 36.0–46.0)
Hemoglobin: 10.5 g/dL — ABNORMAL LOW (ref 12.0–15.0)
Immature Granulocytes: 0 %
Lymphocytes Relative: 29 %
Lymphs Abs: 2.1 10*3/uL (ref 0.7–4.0)
MCH: 31.7 pg (ref 26.0–34.0)
MCHC: 32.4 g/dL (ref 30.0–36.0)
MCV: 97.9 fL (ref 80.0–100.0)
Monocytes Absolute: 1 10*3/uL (ref 0.1–1.0)
Monocytes Relative: 13 %
Neutro Abs: 4 10*3/uL (ref 1.7–7.7)
Neutrophils Relative %: 54 %
Platelets: 98 10*3/uL — ABNORMAL LOW (ref 150–400)
RBC: 3.31 MIL/uL — ABNORMAL LOW (ref 3.87–5.11)
RDW: 26.2 % — ABNORMAL HIGH (ref 11.5–15.5)
WBC: 7.2 10*3/uL (ref 4.0–10.5)
nRBC: 0 % (ref 0.0–0.2)

## 2022-04-24 LAB — COMPREHENSIVE METABOLIC PANEL
ALT: 32 U/L (ref 0–44)
AST: 44 U/L — ABNORMAL HIGH (ref 15–41)
Albumin: 3 g/dL — ABNORMAL LOW (ref 3.5–5.0)
Alkaline Phosphatase: 365 U/L — ABNORMAL HIGH (ref 38–126)
Anion gap: 5 (ref 5–15)
BUN: 21 mg/dL (ref 8–23)
CO2: 23 mmol/L (ref 22–32)
Calcium: 8.1 mg/dL — ABNORMAL LOW (ref 8.9–10.3)
Chloride: 107 mmol/L (ref 98–111)
Creatinine, Ser: 1.1 mg/dL — ABNORMAL HIGH (ref 0.44–1.00)
GFR, Estimated: 56 mL/min — ABNORMAL LOW (ref >60)
Glucose, Bld: 73 mg/dL (ref 70–99)
Potassium: 4.5 mmol/L (ref 3.5–5.1)
Sodium: 135 mmol/L (ref 135–145)
Total Bilirubin: 1.1 mg/dL (ref 0.3–1.2)
Total Protein: 8 g/dL (ref 6.5–8.1)

## 2022-04-24 MED ORDER — HEPARIN SOD (PORK) LOCK FLUSH 100 UNIT/ML IV SOLN
500.0000 [IU] | Freq: Once | INTRAVENOUS | Status: AC
  Administered 2022-04-24: 500 [IU] via INTRAVENOUS
  Filled 2022-04-24: qty 5

## 2022-04-24 MED ORDER — SODIUM CHLORIDE 0.9 % IV SOLN
126.0000 mg | Freq: Once | INTRAVENOUS | Status: AC
  Administered 2022-04-24: 130 mg via INTRAVENOUS
  Filled 2022-04-24: qty 13

## 2022-04-24 MED ORDER — IOHEXOL 300 MG/ML  SOLN
100.0000 mL | Freq: Once | INTRAMUSCULAR | Status: AC | PRN
  Administered 2022-04-24: 100 mL via INTRAVENOUS

## 2022-04-24 MED ORDER — SODIUM CHLORIDE 0.9 % IV SOLN
1400.0000 mg | Freq: Once | INTRAVENOUS | Status: AC
  Administered 2022-04-24: 1400 mg via INTRAVENOUS
  Filled 2022-04-24: qty 26.3

## 2022-04-24 MED ORDER — SODIUM CHLORIDE 0.9% FLUSH
10.0000 mL | Freq: Once | INTRAVENOUS | Status: AC
  Administered 2022-04-24: 10 mL via INTRAVENOUS
  Filled 2022-04-24: qty 10

## 2022-04-24 MED ORDER — SODIUM CHLORIDE 0.9 % IV SOLN
Freq: Once | INTRAVENOUS | Status: AC
  Filled 2022-04-24: qty 250

## 2022-04-24 MED ORDER — PALONOSETRON HCL INJECTION 0.25 MG/5ML
0.2500 mg | Freq: Once | INTRAVENOUS | Status: AC
  Administered 2022-04-24: 0.25 mg via INTRAVENOUS

## 2022-04-24 MED ORDER — FAMOTIDINE IN NACL 20-0.9 MG/50ML-% IV SOLN
20.0000 mg | Freq: Once | INTRAVENOUS | Status: AC
  Administered 2022-04-24: 20 mg via INTRAVENOUS
  Filled 2022-04-24: qty 50

## 2022-04-24 MED ORDER — DIPHENHYDRAMINE HCL 50 MG/ML IJ SOLN
50.0000 mg | Freq: Once | INTRAMUSCULAR | Status: AC
  Administered 2022-04-24: 50 mg via INTRAVENOUS
  Filled 2022-04-24: qty 1

## 2022-04-24 MED ORDER — SODIUM CHLORIDE 0.9 % IV SOLN
10.0000 mg | Freq: Once | INTRAVENOUS | Status: AC
  Administered 2022-04-24: 10 mg via INTRAVENOUS
  Filled 2022-04-24: qty 10

## 2022-04-24 NOTE — Progress Notes (Signed)
Plt 98 - per MD ok to proceed with treatment

## 2022-04-28 ENCOUNTER — Telehealth: Payer: Self-pay | Admitting: Family Medicine

## 2022-04-28 NOTE — Telephone Encounter (Signed)
Medication Refill - Medication: Ondansetron 4 MG ODT Tablets  Requested by Janice Short from [facility]   Has the patient contacted their pharmacy? Yes.   (Agent: If no, request that the patient contact the pharmacy for the refill. If patient does not wish to contact the pharmacy document the reason why and proceed with request.) (Agent: If yes, when and what did the pharmacy advise?)  Preferred Pharmacy (with phone number or street name):  [facility] ([place] - [place], [address] [place] [address]  Phone: [phone] Fax: [phone]   Has the patient been seen for an appointment in the last year OR does the patient have an upcoming appointment? Yes.    Agent: Please be advised that RX refills may take up to 3 business days. We ask that you follow-up with your pharmacy.

## 2022-04-29 ENCOUNTER — Other Ambulatory Visit: Payer: Self-pay

## 2022-04-29 MED ORDER — ONDANSETRON HCL 4 MG PO TABS: 4.0000 mg | ORAL_TABLET | Freq: Three times a day (TID) | ORAL | 0 refills | Status: DC | PRN

## 2022-05-08 ENCOUNTER — Inpatient Hospital Stay: Payer: Medicare Other

## 2022-05-08 ENCOUNTER — Inpatient Hospital Stay: Payer: Medicare Other | Admitting: Oncology

## 2022-05-12 ENCOUNTER — Inpatient Hospital Stay: Payer: Medicare Other | Attending: Nurse Practitioner | Admitting: Hospice and Palliative Medicine

## 2022-05-12 DIAGNOSIS — I10 Essential (primary) hypertension: Secondary | ICD-10-CM | POA: Insufficient documentation

## 2022-05-12 DIAGNOSIS — Z5111 Encounter for antineoplastic chemotherapy: Secondary | ICD-10-CM | POA: Insufficient documentation

## 2022-05-12 DIAGNOSIS — D696 Thrombocytopenia, unspecified: Secondary | ICD-10-CM | POA: Insufficient documentation

## 2022-05-12 DIAGNOSIS — C221 Intrahepatic bile duct carcinoma: Secondary | ICD-10-CM | POA: Insufficient documentation

## 2022-05-12 DIAGNOSIS — D6481 Anemia due to antineoplastic chemotherapy: Secondary | ICD-10-CM | POA: Insufficient documentation

## 2022-05-12 NOTE — Progress Notes (Signed)
I did not reach patient by phone.  Unable to leave voicemail.  Will reschedule.

## 2022-05-15 ENCOUNTER — Inpatient Hospital Stay: Payer: Medicare Other

## 2022-05-15 ENCOUNTER — Inpatient Hospital Stay (HOSPITAL_BASED_OUTPATIENT_CLINIC_OR_DEPARTMENT_OTHER): Payer: Medicare Other | Admitting: Medical Oncology

## 2022-05-15 ENCOUNTER — Other Ambulatory Visit: Payer: Self-pay | Admitting: Internal Medicine

## 2022-05-15 ENCOUNTER — Telehealth: Payer: Self-pay | Admitting: *Deleted

## 2022-05-15 VITALS — BP 149/69 | HR 70 | Temp 98.0°F | Resp 16 | Wt 160.0 lb

## 2022-05-15 DIAGNOSIS — C221 Intrahepatic bile duct carcinoma: Secondary | ICD-10-CM | POA: Diagnosis present

## 2022-05-15 DIAGNOSIS — D6481 Anemia due to antineoplastic chemotherapy: Secondary | ICD-10-CM

## 2022-05-15 DIAGNOSIS — Z5111 Encounter for antineoplastic chemotherapy: Secondary | ICD-10-CM

## 2022-05-15 DIAGNOSIS — T451X5A Adverse effect of antineoplastic and immunosuppressive drugs, initial encounter: Secondary | ICD-10-CM

## 2022-05-15 DIAGNOSIS — I10 Essential (primary) hypertension: Secondary | ICD-10-CM | POA: Diagnosis not present

## 2022-05-15 DIAGNOSIS — D696 Thrombocytopenia, unspecified: Secondary | ICD-10-CM | POA: Diagnosis not present

## 2022-05-15 LAB — CBC WITH DIFFERENTIAL/PLATELET
Abs Immature Granulocytes: 0.01 10*3/uL (ref 0.00–0.07)
Basophils Absolute: 0.1 10*3/uL (ref 0.0–0.1)
Basophils Relative: 1 %
Eosinophils Absolute: 0.1 10*3/uL (ref 0.0–0.5)
Eosinophils Relative: 2 %
HCT: 33.3 % — ABNORMAL LOW (ref 36.0–46.0)
Hemoglobin: 10.6 g/dL — ABNORMAL LOW (ref 12.0–15.0)
Immature Granulocytes: 0 %
Lymphocytes Relative: 26 %
Lymphs Abs: 1.6 10*3/uL (ref 0.7–4.0)
MCH: 33.1 pg (ref 26.0–34.0)
MCHC: 31.8 g/dL (ref 30.0–36.0)
MCV: 104.1 fL — ABNORMAL HIGH (ref 80.0–100.0)
Monocytes Absolute: 0.8 10*3/uL (ref 0.1–1.0)
Monocytes Relative: 13 %
Neutro Abs: 3.7 10*3/uL (ref 1.7–7.7)
Neutrophils Relative %: 58 %
Platelets: 175 10*3/uL (ref 150–400)
RBC: 3.2 MIL/uL — ABNORMAL LOW (ref 3.87–5.11)
RDW: 22.7 % — ABNORMAL HIGH (ref 11.5–15.5)
WBC: 6.4 10*3/uL (ref 4.0–10.5)
nRBC: 0 % (ref 0.0–0.2)

## 2022-05-15 LAB — COMPREHENSIVE METABOLIC PANEL
ALT: 26 U/L (ref 0–44)
AST: 42 U/L — ABNORMAL HIGH (ref 15–41)
Albumin: 3 g/dL — ABNORMAL LOW (ref 3.5–5.0)
Alkaline Phosphatase: 262 U/L — ABNORMAL HIGH (ref 38–126)
Anion gap: 5 (ref 5–15)
BUN: 16 mg/dL (ref 8–23)
CO2: 24 mmol/L (ref 22–32)
Calcium: 7.9 mg/dL — ABNORMAL LOW (ref 8.9–10.3)
Chloride: 111 mmol/L (ref 98–111)
Creatinine, Ser: 1.18 mg/dL — ABNORMAL HIGH (ref 0.44–1.00)
GFR, Estimated: 52 mL/min — ABNORMAL LOW (ref >60)
Glucose, Bld: 227 mg/dL — ABNORMAL HIGH (ref 70–99)
Potassium: 4.6 mmol/L (ref 3.5–5.1)
Sodium: 140 mmol/L (ref 135–145)
Total Bilirubin: 0.7 mg/dL (ref 0.3–1.2)
Total Protein: 7.7 g/dL (ref 6.5–8.1)

## 2022-05-15 MED ORDER — PALONOSETRON HCL INJECTION 0.25 MG/5ML
0.2500 mg | Freq: Once | INTRAVENOUS | Status: AC
  Administered 2022-05-15: 0.25 mg via INTRAVENOUS
  Filled 2022-05-15: qty 5

## 2022-05-15 MED ORDER — DIPHENHYDRAMINE HCL 50 MG/ML IJ SOLN
50.0000 mg | Freq: Once | INTRAMUSCULAR | Status: AC
  Administered 2022-05-15: 50 mg via INTRAVENOUS
  Filled 2022-05-15: qty 1

## 2022-05-15 MED ORDER — FAMOTIDINE IN NACL 20-0.9 MG/50ML-% IV SOLN
20.0000 mg | Freq: Once | INTRAVENOUS | Status: AC
  Administered 2022-05-15: 20 mg via INTRAVENOUS
  Filled 2022-05-15: qty 50

## 2022-05-15 MED ORDER — SODIUM CHLORIDE 0.9 % IV SOLN
10.0000 mg | Freq: Once | INTRAVENOUS | Status: AC
  Administered 2022-05-15: 10 mg via INTRAVENOUS
  Filled 2022-05-15: qty 10

## 2022-05-15 MED ORDER — SODIUM CHLORIDE 0.9 % IV SOLN
Freq: Once | INTRAVENOUS | Status: AC
  Filled 2022-05-15: qty 250

## 2022-05-15 MED ORDER — HEPARIN SOD (PORK) LOCK FLUSH 100 UNIT/ML IV SOLN
500.0000 [IU] | Freq: Once | INTRAVENOUS | Status: AC | PRN
  Administered 2022-05-15: 500 [IU]
  Filled 2022-05-15: qty 5

## 2022-05-15 MED ORDER — SODIUM CHLORIDE 0.9 % IV SOLN
1400.0000 mg | Freq: Once | INTRAVENOUS | Status: AC
  Administered 2022-05-15: 1400 mg via INTRAVENOUS
  Filled 2022-05-15: qty 26.3

## 2022-05-15 MED ORDER — SODIUM CHLORIDE 0.9 % IV SOLN
126.0000 mg | Freq: Once | INTRAVENOUS | Status: AC
  Administered 2022-05-15: 130 mg via INTRAVENOUS
  Filled 2022-05-15: qty 13

## 2022-05-15 NOTE — Progress Notes (Signed)
Hematology/Oncology Consult note [facility]  Telephone:([phone] [phone]  Patient Care Team: Steele Sizer, MD as PCP - General (Family Medicine) Clent Jacks, RN as Oncology Nurse Navigator [place], [place], LCSW as Social Worker Germaine Pomfret, [place] (Pharmacist) Sindy Guadeloupe, MD as Consulting Physician (Oncology) Borders, Kirt Boys, NP as Nurse Practitioner [facility])   Name of the patient: Janice Short  [id number]  25-May-1961   Date of visit: 05/15/22  Diagnosis-metastatic intrahepatic cholangiocarcinoma  Chief complaint/ Reason for visit-on treatment assessment prior to cycle 11-day 1 of carboplatin gemcitabine chemotherapy  Heme/Onc history: Patient is a 61 year old African-American female with a past medical history significant for hypertension hyperlipidemia and type 2 diabetes who presented to the ER with symptoms of cramping abdominal pain and nausea.  This was followed by right upper quadrant ultrasound which showed multiple echogenic masses in the liver with nodular contour of the liver possibly suggestive of cirrhosis.  Several nodular masses in the vicinity of the pancreatic head and porta hepatic lymph nodes.  This was followed by an MRI abdomen and MRCP.  It showed right hepatic lobe masslike area measuring 4.9 x 2.9 cm.  Diffusion abnormality in the left hepatic lobe with mild left-sided biliary ductal dilatation and another area of abnormality measuring 1.4 x 1 cm.  Bulky celiac adenopathy 3 x 2.1 cm tracking into gastrohepatic recess.  Suspected portal venous invasion with convex protrusion in the right portal vein.  Left adrenal lesion measuring 2.9 x 1.5 cm.  Findings concerning for cholangiocarcinoma or biphenotypic cholangiole hepatocellular neoplasm.     Patient underwent CT-guided liver biopsy Which was consistent with adenocarcinoma poorly differentiated.  Immunohistochemistry showed CK7  positivity, CDX2 negative, Heppar negative, GATA3 negative.  CK19 positive.  Differentials include cholangiocarcinoma, pancreatic, upper GI.  Lung and breast less likely.  However given CK19 positivity most compatible with cholangiocarcinoma.   Patient's case was discussed at tumor board and it was felt as if the lymphadenopathy was more like a confluent primary tumor mass.  Patient was referred to [facility] for second opinion to see if she would be a candidate for surgery.However upon their review of the PET scan it was deemed that this was indeed periportal adenopathy.  They also did a CT chest abdomen and pelvis with MIPS protocol and she was also found to have portocaval lymph node measuring 2.4 cm which was more consistent with metastatic disease.  She was deemed unresectable   NGS testing showed BRCA2, FGFR2, PIK3CA, RAD 54L, CDC73, CDKN2 A/B CR EBBP rearrangement exon 31    Patient has been getting gemcitabine cisplatin chemotherapy 2 weeks on 1 week off since August 2022.  Patient had an allergic reaction to cisplatin and therefore was switched to carboplatin gemcitabine chemotherapy which she gets week on week off      Interval history- Patient reports that she is doing "really well". She is feeling well. She is eating really well. No night sweats, headaches, N/V/D/C. Tolerating treatment well.   ECOG PS- 1 Pain scale- 0   Review of systems- Review of Systems  Constitutional:  Negative for chills, fever, malaise/fatigue and weight loss.  HENT:  Negative for congestion, ear discharge and nosebleeds.   Eyes:  Negative for blurred vision.  Respiratory:  Negative for cough, hemoptysis, sputum production, shortness of breath and wheezing.   Cardiovascular:  Negative for chest pain, palpitations, orthopnea and claudication.  Gastrointestinal:  Negative for abdominal pain, blood in stool, constipation,  diarrhea, heartburn, melena, nausea and vomiting.  Genitourinary:  Negative for dysuria, flank  pain, frequency, hematuria and urgency.  Musculoskeletal:  Negative for back pain, joint pain and myalgias.  Skin:  Negative for rash.  Neurological:  Negative for dizziness, tingling, focal weakness, seizures, weakness and headaches.  Endo/Heme/Allergies:  Does not bruise/bleed easily.  Psychiatric/Behavioral:  Negative for depression and suicidal ideas. The patient does not have insomnia.        Allergies  Allergen Reactions   Other Shortness Of Breath    Cat dander and grass pollen   Cisplatin Itching   Bydureon [Exenatide] Other (See Comments)    Pain with injection not allergy     Past Medical History:  Diagnosis Date   Allergy    Asthma    Carpal tunnel syndrome on left    Cervical radiculitis    Chronic osteoarthritis    Corns and callosity    Depression    Dermatophytosis of foot    Diabetes mellitus without complication (HCC)    Gout    Hyperlipidemia    Hypertension    Impingement syndrome of left shoulder    Intrahepatic cholangiocarcinoma (HCC)    Lumbosacral neuritis    Microalbuminuria    Obesity    Supraventricular tachycardia ([place]) 07/01/2015   SVT (supraventricular tachycardia) (HCC)    Tenosynovitis of wrist    Vitamin D deficiency      Past Surgical History:  Procedure Laterality Date   ABDOMINAL HYSTERECTOMY     COLONOSCOPY WITH PROPOFOL N/A 05/06/2021   Procedure: COLONOSCOPY WITH PROPOFOL;  Surgeon: Lucilla Lame, MD;  Location: [facility] ENDOSCOPY;  Service: Endoscopy;  Laterality: N/A;   ESOPHAGOGASTRODUODENOSCOPY (EGD) WITH PROPOFOL N/A 05/06/2021   Procedure: ESOPHAGOGASTRODUODENOSCOPY (EGD) WITH PROPOFOL;  Surgeon: Lucilla Lame, MD;  Location: [facility] ENDOSCOPY;  Service: Endoscopy;  Laterality: N/A;   PORTA CATH INSERTION N/A 07/17/2021   Procedure: PORTA CATH INSERTION;  Surgeon: Algernon Huxley, MD;  Location: [place] [facility] LAB;  Service: Cardiovascular;  Laterality: N/A;    Social History   Socioeconomic History   Marital status: Single     Spouse name: Not on file   Number of children: 2   Years of education: Not on file   Highest education level: Not on file  Occupational History   Occupation: disable     Comment: from chronic back pain, 2019  Tobacco Use   Smoking status: Some Days    Types: Cigarettes   Smokeless tobacco: Never   Tobacco comments:    1 per day 01/30/21 does not inhale it  Vaping Use   Vaping Use: Never used  Substance and Sexual Activity   Alcohol use: Not Currently   Drug use: No   Sexual activity: Not Currently  Other Topics Concern   Not on file  Social History Narrative   She is on disability for chronic back pain. Pt's son lives with her   Social Determinants of Health   Financial Resource Strain: Medium Risk (02/03/2022)   Overall Financial Resource Strain (CARDIA)    Difficulty of Paying Living Expenses: Somewhat hard  Food Insecurity: Food Insecurity Present (02/03/2022)   Hunger Vital Sign    Worried About Running Out of Food in the Last Year: Sometimes true    Ran Out of Food in the Last Year: Never true  Transportation Needs: No Transportation Needs (04/24/2022)   PRAPARE - Hydrologist (Medical): No    Lack of Transportation (Non-Medical): No  Physical Activity: Inactive (02/03/2022)   Exercise Vital Sign    Days of Exercise per Week: 0 days    Minutes of Exercise per Session: 0 min  Stress: No Stress Concern Present (02/03/2022)   [place]    Feeling of Stress : Only a little  Social Connections: Moderately Isolated (02/03/2022)   Social Connection and Isolation Panel [NHANES]    Frequency of Communication with Friends and Family: More than three times a week    Frequency of Social Gatherings with Friends and Family: Three times a week    Attends Religious Services: More than 4 times per year    Active Member of Clubs or Organizations: No    Attends Archivist Meetings:  Never    Marital Status: Never married  Intimate Partner Violence: Not At Risk (02/03/2022)   Humiliation, Afraid, Rape, and Kick questionnaire    Fear of Current or Ex-Partner: No    Emotionally Abused: No    Physically Abused: No    Sexually Abused: No    Family History  Problem Relation Age of Onset   Heart attack Mother    CAD Mother    Kidney disease Father      Current Outpatient Medications:    albuterol (VENTOLIN HFA) 108 (90 Base) MCG/ACT inhaler, INHALE 2 PUFFS BY MOUTH EVERY 6 HOURS AS NEEDED FOR WHEEZING AND FOR SHORTNESS OF BREATH, Disp: 18 g, Rfl: 2   aspirin 81 MG tablet, Take 1 tablet by mouth daily., Disp: , Rfl:    atorvastatin (LIPITOR) 40 MG tablet, Take 1 tablet (40 mg total) by mouth at bedtime., Disp: 90 tablet, Rfl: 1   blood glucose meter kit and supplies, Dispense based on patient and insurance preference. Use up to four times daily as directed. (FOR ICD-10 E10.9, E11.9)., Disp: 1 each, Rfl: 0   diphenhydramine-acetaminophen (TYLENOL PM) 25-500 MG TABS tablet, Take 1 tablet by mouth at bedtime as needed., Disp: , Rfl:    escitalopram (LEXAPRO) 20 MG tablet, TAKE ONE TABLET BY MOUTH DAILY AT 9AM, Disp: 90 tablet, Rfl: 1   fluticasone (FLONASE) 50 MCG/ACT nasal spray, USE 2 SPRAY(S) IN EACH NOSTRIL AT BEDTIME (Patient taking differently: Place 2 sprays into both nostrils daily as needed. USE 2 SPRAY(S) IN EACH NOSTRIL AT BEDTIME), Disp: 16 g, Rfl: 0   Fluticasone-Umeclidin-Vilant (TRELEGY ELLIPTA) 100-62.5-25 MCG/INH AEPB, Take 1 puff by mouth daily., Disp: 180 each, Rfl: 1   folic acid (FOLVITE) 1 MG tablet, Take 1 tablet (1 mg total) by mouth daily. (Patient not taking: Reported on 02/13/2022), Disp: 30 tablet, Rfl: 3   gabapentin (NEURONTIN) 300 MG capsule, TAKE TWO CAPSULES BY MOUTH TWICE DAILY @ 9AM & 5PM, Disp: 360 capsule, Rfl: 1   insulin degludec (TRESIBA FLEXTOUCH) 100 UNIT/ML FlexTouch Pen, INJECT 12-30 UNITS OF INSULIN INTO THE SKIN DAILY TO KEEP FASTING  GLUCOSE BETWEEN 100-140, Disp: 15 mL, Rfl: 1   Insulin Pen Needle 31G X 8 MM MISC, 1 each by Does not apply route daily., Disp: 100 each, Rfl: 1   lidocaine-prilocaine (EMLA) cream, Apply 1 application. topically as needed. Apply to port and cover with saran wrap 1-2 hours prior to port access, Disp: 30 g, Rfl: 3   ondansetron (ZOFRAN) 4 MG tablet, Take 1 tablet (4 mg total) by mouth every 8 (eight) hours as needed., Disp: 20 tablet, Rfl: 0   oxyCODONE-acetaminophen (PERCOCET) 5-325 MG tablet, Take 1 tablet by mouth every 4 (four)  hours as needed for severe pain. (Patient not taking: Reported on 04/10/2022), Disp: 20 tablet, Rfl: 0 No current facility-administered medications for this visit.  Facility-Administered Medications Ordered in Other Visits:    heparin lock flush 100 UNIT/ML injection, , , ,   Physical exam:  Vitals:   05/15/22 0924  BP: (!) 149/69  Pulse: 70  Resp: 16  Temp: 98 F (36.7 C)  TempSrc: Oral  SpO2: 100%  Weight: 160 lb (72.6 kg)   Physical Exam Cardiovascular:     Rate and Rhythm: Normal rate and regular rhythm.     Heart sounds: Normal heart sounds.  Pulmonary:     Effort: Pulmonary effort is normal.     Breath sounds: Normal breath sounds.  Abdominal:     General: Bowel sounds are normal.     Palpations: Abdomen is soft.  Skin:    General: Skin is warm and dry.  Neurological:     Mental Status: She is alert and oriented to person, place, and time.         Latest Ref Rng & Units 05/15/2022    8:40 AM  CMP  Glucose 70 - 99 mg/dL 227   BUN 8 - 23 mg/dL 16   Creatinine 0.44 - 1.00 mg/dL 1.18   Sodium 135 - 145 mmol/L 140   Potassium 3.5 - 5.1 mmol/L 4.6   Chloride 98 - 111 mmol/L 111   CO2 22 - 32 mmol/L 24   Calcium 8.9 - 10.3 mg/dL 7.9   Total Protein 6.5 - 8.1 g/dL 7.7   Total Bilirubin 0.3 - 1.2 mg/dL 0.7   Alkaline Phos 38 - 126 U/L 262   AST 15 - 41 U/L 42   ALT 0 - 44 U/L 26       Latest Ref Rng & Units 05/15/2022    8:40 AM  CBC  WBC  4.0 - 10.5 K/uL 6.4   Hemoglobin 12.0 - 15.0 g/dL 10.6   Hematocrit 36.0 - 46.0 % 33.3   Platelets 150 - 400 K/uL 175     No images are attached to the encounter.  CT CHEST ABDOMEN PELVIS W CONTRAST  Result Date: 04/24/2022 CLINICAL DATA:  Intrahepatic cholangiocarcinoma restaging * Tracking Code: BO * EXAM: CT CHEST, ABDOMEN, AND PELVIS WITH CONTRAST TECHNIQUE: Multidetector CT imaging of the chest, abdomen and pelvis was performed following the standard protocol during bolus administration of intravenous contrast. RADIATION DOSE REDUCTION: This exam was performed according to the departmental dose-optimization program which includes automated exposure control, adjustment of the mA and/or kV according to patient size and/or use of iterative reconstruction technique. CONTRAST:  137m OMNIPAQUE IOHEXOL 300 MG/ML SOLN, additional oral enteric contrast COMPARISON:  11/17/2021 FINDINGS: CT CHEST FINDINGS Cardiovascular: Right chest port catheter. Aortic atherosclerosis. Normal heart size. Left and right coronary artery calcifications. No pericardial effusion. Mediastinum/Nodes: No enlarged mediastinal, hilar, or axillary lymph nodes. Thyroid gland, trachea, and esophagus demonstrate no significant findings. Lungs/Pleura: Mild, bandlike scarring of the medial segment right middle lobe (series 4, image 102). No pleural effusion or pneumothorax. Musculoskeletal: No chest wall mass or suspicious osseous lesions identified. CT ABDOMEN PELVIS FINDINGS Hepatobiliary: Unchanged, heterogeneously hypoenhancing mass of the right lobe of the liver, centered in hepatic segment V, with severe atrophy of the right lobe and hypertrophy of the left lobe. Unchanged segmental ductal dilatation within hepatic segments VII and VIII (series 2, image 52). Mass measures 5.2 x 4.1 cm (series 2, image 55). Tiny gallstones and or sludge in  the gallbladder fundus (series 2, image 70). No gallbladder wall thickening or biliary dilatation.  Pancreas: Unremarkable. No pancreatic ductal dilatation or surrounding inflammatory changes. Spleen: Normal in size without significant abnormality. Adrenals/Urinary Tract: Adrenal glands are unremarkable. Kidneys are normal, without renal calculi, solid lesion, or hydronephrosis. Bladder is unremarkable. Stomach/Bowel: Stomach is within normal limits. Appendix appears normal. No evidence of bowel wall thickening, distention, or inflammatory changes. Vascular/Lymphatic: Aortic atherosclerosis. Unchanged enlarged porta hepatis nodes measuring up to 2.2 x 1.1 cm (series 2, image 60). Reproductive: Status post hysterectomy. Other: No abdominal wall hernia or abnormality. No ascites. Musculoskeletal: No acute osseous findings. IMPRESSION: 1. Unchanged, heterogeneously hypoenhancing mass of the right lobe of the liver, centered in hepatic segment V, with unchanged segmental ductal dilatation within right lobe of the liver. 2. Unchanged enlarged porta hepatis nodes. 3. No evidence of new metastatic disease in the chest, abdomen, or pelvis. 4. Cholelithiasis. 5. Coronary artery disease. Aortic Atherosclerosis (ICD10-I70.0). Electronically Signed   By: Delanna Ahmadi M.D.   On: 04/24/2022 15:51     Assessment and plan- Patient is a 61 y.o. female with stage IV intrahepatic cholangiocarcinoma.  She is here for on treatment assessment prior to cycle 11-day 1 of gemcitabine carboplatin chemotherapy  Reviewed labs and they are acceptable for her to proceed with Cycle 12 Day 1 today of gemcitabine carboplatin chemotherapy. She will return for cycle 11-day 15 in 2 weeks and Dr. Janese Banks will see her back in 4 weeks for consideration of Cycle 13 of Gemcitabine Carboplatin chemotherapy.    Chemo-induced anemia: Chronic. Stable. Continue to monitor  Mild thrombocytopenia likely secondary to chemotherapy   Visit Diagnosis 1. Intrahepatic cholangiocarcinoma ([place])   2. Encounter for antineoplastic chemotherapy   3.  Antineoplastic chemotherapy induced anemia      Nelwyn Salisbury PA-C [facility] 05/15/2022 9:59 AM

## 2022-05-15 NOTE — Progress Notes (Signed)
Returns for follow-up. No new concerns.

## 2022-05-15 NOTE — Telephone Encounter (Signed)
I got an email from Ulice Dash and she says the patient has $500 more left in her grant and if she would bring her built up on Monday and give it to [place] then she would be able to take care of the bill that she and wanted [place] to help with.  The patient said when I called her that she will come Monday and bring it and I told her to make sure that she gives it to [place].  She understands.

## 2022-05-15 NOTE — Patient Instructions (Signed)
[facility] [place]  Discharge Instructions: Thank you for choosing [facility] to provide your oncology and hematology care.  If you have a lab appointment with the [facility], please go directly to the [facility] and check in at the registration area.  Wear comfortable clothing and clothing appropriate for easy access to any Portacath or PICC line.   We strive to give you quality time with your provider. You may need to reschedule your appointment if you arrive late (15 or more minutes).  Arriving late affects you and other patients whose appointments are after yours.  Also, if you miss three or more appointments without notifying the office, you may be dismissed from the clinic at the provider's discretion.      For prescription refill requests, have your pharmacy contact our office and allow 72 hours for refills to be completed.    Today you received the following chemotherapy and/or immunotherapy agents CARBOPLATIN and TAXOL      To help prevent nausea and vomiting after your treatment, we encourage you to take your nausea medication as directed.  BELOW ARE SYMPTOMS THAT SHOULD BE REPORTED IMMEDIATELY: *FEVER GREATER THAN 100.4 F (38 C) OR HIGHER *CHILLS OR SWEATING *NAUSEA AND VOMITING THAT IS NOT CONTROLLED WITH YOUR NAUSEA MEDICATION *UNUSUAL SHORTNESS OF BREATH *UNUSUAL BRUISING OR BLEEDING *URINARY PROBLEMS (pain or burning when urinating, or frequent urination) *BOWEL PROBLEMS (unusual diarrhea, constipation, pain near the anus) TENDERNESS IN MOUTH AND THROAT WITH OR WITHOUT PRESENCE OF ULCERS (sore throat, sores in mouth, or a toothache) UNUSUAL RASH, SWELLING OR PAIN  UNUSUAL VAGINAL DISCHARGE OR ITCHING   Items with * indicate a potential emergency and should be followed up as soon as possible or go to the Emergency Department if any problems should occur.  Please show the CHEMOTHERAPY ALERT CARD or IMMUNOTHERAPY ALERT CARD at  check-in to the Emergency Department and triage nurse.  Should you have questions after your visit or need to cancel or reschedule your appointment, please contact [facility] [place] [facility] [place]  [phone] and follow the prompts.  Office hours are 8:00 a.m. to 4:30 p.m. Monday - Friday. Please note that voicemails left after 4:00 p.m. may not be returned until the following business day.  We are closed weekends and major holidays. You have access to a nurse at all times for urgent questions. Please call the main number to the clinic [phone] and follow the prompts.  For any non-urgent questions, you may also contact your provider using MyChart. We now offer e-Visits for anyone 96 and older to request care online for non-urgent symptoms. For details visit [email or web address].   Also download the MyChart app! Go to the app store, search "MyChart", open the app, select [facility], and log in with your MyChart username and password.  Due to Covid, a mask is required upon entering the hospital/clinic. If you do not have a mask, one will be given to you upon arrival. For doctor visits, patients may have 1 support person aged 110 or older with them. For treatment visits, patients cannot have anyone with them due to current Covid guidelines and our immunocompromised population.   Carboplatin injection What is this medication? CARBOPLATIN (KAR boe pla tin) is a chemotherapy drug. It targets fast dividing cells, like cancer cells, and causes these cells to die. This medicine is used to treat ovarian cancer and many other cancers. This medicine may be used for other purposes; ask your health care  provider or pharmacist if you have questions. COMMON BRAND NAME(S): Paraplatin What should I tell my care team before I take this medication? They need to know if you have any of these conditions: blood disorders hearing problems kidney disease recent or ongoing radiation therapy an  unusual or allergic reaction to carboplatin, cisplatin, other chemotherapy, other medicines, foods, dyes, or preservatives pregnant or trying to get pregnant breast-feeding How should I use this medication? This drug is usually given as an infusion into a vein. It is administered in a hospital or clinic by a specially trained health care professional. Talk to your pediatrician regarding the use of this medicine in children. Special care may be needed. Overdosage: If you think you have taken too much of this medicine contact a poison control center or emergency room at once. NOTE: This medicine is only for you. Do not share this medicine with others. What if I miss a dose? It is important not to miss a dose. Call your doctor or health care professional if you are unable to keep an appointment. What may interact with this medication? medicines for seizures medicines to increase blood counts like filgrastim, pegfilgrastim, sargramostim some antibiotics like amikacin, gentamicin, neomycin, streptomycin, tobramycin vaccines Talk to your doctor or health care professional before taking any of these medicines: acetaminophen aspirin ibuprofen ketoprofen naproxen This list may not describe all possible interactions. Give your health care provider a list of all the medicines, herbs, non-prescription drugs, or dietary supplements you use. Also tell them if you smoke, drink alcohol, or use illegal drugs. Some items may interact with your medicine. What should I watch for while using this medication? Your condition will be monitored carefully while you are receiving this medicine. You will need important blood work done while you are taking this medicine. This drug may make you feel generally unwell. This is not uncommon, as chemotherapy can affect healthy cells as well as cancer cells. Report any side effects. Continue your course of treatment even though you feel ill unless your doctor tells you to  stop. In some cases, you may be given additional medicines to help with side effects. Follow all directions for their use. Call your doctor or health care professional for advice if you get a fever, chills or sore throat, or other symptoms of a cold or flu. Do not treat yourself. This drug decreases your body's ability to fight infections. Try to avoid being around people who are sick. This medicine may increase your risk to bruise or bleed. Call your doctor or health care professional if you notice any unusual bleeding. Be careful brushing and flossing your teeth or using a toothpick because you may get an infection or bleed more easily. If you have any dental work done, tell your dentist you are receiving this medicine. Avoid taking products that contain aspirin, acetaminophen, ibuprofen, naproxen, or ketoprofen unless instructed by your doctor. These medicines may hide a fever. Do not become pregnant while taking this medicine. Women should inform their doctor if they wish to become pregnant or think they might be pregnant. There is a potential for serious side effects to an unborn child. Talk to your health care professional or pharmacist for more information. Do not breast-feed an infant while taking this medicine. What side effects may I notice from receiving this medication? Side effects that you should report to your doctor or health care professional as soon as possible: allergic reactions like skin rash, itching or hives, swelling of the face, lips,  or tongue signs of infection - fever or chills, cough, sore throat, pain or difficulty passing urine signs of decreased platelets or bleeding - bruising, pinpoint red spots on the skin, black, tarry stools, nosebleeds signs of decreased red blood cells - unusually weak or tired, fainting spells, lightheadedness breathing problems changes in hearing changes in vision chest pain high blood pressure low blood counts - This drug may decrease the  number of white blood cells, red blood cells and platelets. You may be at increased risk for infections and bleeding. nausea and vomiting pain, swelling, redness or irritation at the injection site pain, tingling, numbness in the hands or feet problems with balance, talking, walking trouble passing urine or change in the amount of urine Side effects that usually do not require medical attention (report to your doctor or health care professional if they continue or are bothersome): hair loss loss of appetite metallic taste in the mouth or changes in taste This list may not describe all possible side effects. Call your doctor for medical advice about side effects. You may report side effects to FDA at 1-800-FDA-1088. Where should I keep my medication? This drug is given in a hospital or clinic and will not be stored at home. NOTE: This sheet is a summary. It may not cover all possible information. If you have questions about this medicine, talk to your doctor, pharmacist, or health care provider.  2023 Elsevier/Gold Standard (2008-05-02 00:00:00)  Paclitaxel injection What is this medication? PACLITAXEL (PAK li TAX el) is a chemotherapy drug. It targets fast dividing cells, like cancer cells, and causes these cells to die. This medicine is used to treat ovarian cancer, breast cancer, lung cancer, Kaposi's sarcoma, and other cancers. This medicine may be used for other purposes; ask your health care provider or pharmacist if you have questions. COMMON BRAND NAME(S): Onxol, Taxol What should I tell my care team before I take this medication? They need to know if you have any of these conditions: history of irregular heartbeat liver disease low blood counts, like low white cell, platelet, or red cell counts lung or breathing disease, like asthma tingling of the fingers or toes, or other nerve disorder an unusual or allergic reaction to paclitaxel, alcohol, polyoxyethylated castor oil, other  chemotherapy, other medicines, foods, dyes, or preservatives pregnant or trying to get pregnant breast-feeding How should I use this medication? This drug is given as an infusion into a vein. It is administered in a hospital or clinic by a specially trained health care professional. Talk to your pediatrician regarding the use of this medicine in children. Special care may be needed. Overdosage: If you think you have taken too much of this medicine contact a poison control center or emergency room at once. NOTE: This medicine is only for you. Do not share this medicine with others. What if I miss a dose? It is important not to miss your dose. Call your doctor or health care professional if you are unable to keep an appointment. What may interact with this medication? Do not take this medicine with any of the following medications: live virus vaccines This medicine may also interact with the following medications: antiviral medicines for hepatitis, HIV or AIDS certain antibiotics like erythromycin and clarithromycin certain medicines for fungal infections like ketoconazole and itraconazole certain medicines for seizures like carbamazepine, phenobarbital, phenytoin gemfibrozil nefazodone rifampin St. John's wort This list may not describe all possible interactions. Give your health care provider a list of all the medicines,  herbs, non-prescription drugs, or dietary supplements you use. Also tell them if you smoke, drink alcohol, or use illegal drugs. Some items may interact with your medicine. What should I watch for while using this medication? Your condition will be monitored carefully while you are receiving this medicine. You will need important blood work done while you are taking this medicine. This medicine can cause serious allergic reactions. To reduce your risk you will need to take other medicine(s) before treatment with this medicine. If you experience allergic reactions like skin  rash, itching or hives, swelling of the face, lips, or tongue, tell your doctor or health care professional right away. In some cases, you may be given additional medicines to help with side effects. Follow all directions for their use. This drug may make you feel generally unwell. This is not uncommon, as chemotherapy can affect healthy cells as well as cancer cells. Report any side effects. Continue your course of treatment even though you feel ill unless your doctor tells you to stop. Call your doctor or health care professional for advice if you get a fever, chills or sore throat, or other symptoms of a cold or flu. Do not treat yourself. This drug decreases your body's ability to fight infections. Try to avoid being around people who are sick. This medicine may increase your risk to bruise or bleed. Call your doctor or health care professional if you notice any unusual bleeding. Be careful brushing and flossing your teeth or using a toothpick because you may get an infection or bleed more easily. If you have any dental work done, tell your dentist you are receiving this medicine. Avoid taking products that contain aspirin, acetaminophen, ibuprofen, naproxen, or ketoprofen unless instructed by your doctor. These medicines may hide a fever. Do not become pregnant while taking this medicine. Women should inform their doctor if they wish to become pregnant or think they might be pregnant. There is a potential for serious side effects to an unborn child. Talk to your health care professional or pharmacist for more information. Do not breast-feed an infant while taking this medicine. Men are advised not to father a child while receiving this medicine. This product may contain alcohol. Ask your pharmacist or healthcare provider if this medicine contains alcohol. Be sure to tell all healthcare providers you are taking this medicine. Certain medicines, like metronidazole and disulfiram, can cause an unpleasant  reaction when taken with alcohol. The reaction includes flushing, headache, nausea, vomiting, sweating, and increased thirst. The reaction can last from 30 minutes to several hours. What side effects may I notice from receiving this medication? Side effects that you should report to your doctor or health care professional as soon as possible: allergic reactions like skin rash, itching or hives, swelling of the face, lips, or tongue breathing problems changes in vision fast, irregular heartbeat high or low blood pressure mouth sores pain, tingling, numbness in the hands or feet signs of decreased platelets or bleeding - bruising, pinpoint red spots on the skin, black, tarry stools, blood in the urine signs of decreased red blood cells - unusually weak or tired, feeling faint or lightheaded, falls signs of infection - fever or chills, cough, sore throat, pain or difficulty passing urine signs and symptoms of liver injury like dark yellow or brown urine; general ill feeling or flu-like symptoms; light-colored stools; loss of appetite; nausea; right upper belly pain; unusually weak or tired; yellowing of the eyes or skin swelling of the ankles, feet, hands  unusually slow heartbeat Side effects that usually do not require medical attention (report to your doctor or health care professional if they continue or are bothersome): diarrhea hair loss loss of appetite muscle or joint pain nausea, vomiting pain, redness, or irritation at site where injected tiredness This list may not describe all possible side effects. Call your doctor for medical advice about side effects. You may report side effects to FDA at 1-800-FDA-1088. Where should I keep my medication? This drug is given in a hospital or clinic and will not be stored at home. NOTE: This sheet is a summary. It may not cover all possible information. If you have questions about this medicine, talk to your doctor, pharmacist, or health care  provider.  2023 Elsevier/Gold Standard (2021-10-24 00:00:00)

## 2022-05-28 ENCOUNTER — Telehealth: Payer: Self-pay | Admitting: Family Medicine

## 2022-05-28 NOTE — Telephone Encounter (Signed)
Copied from [facility] [phone]. Topic: General - Other >> May 28, 2022 12:43 PM [place] B wrote: Reason for CRM: Jasmine from [facility] called in about refill on Insulin Pen Needle 31G X 8 MM. Patient says needs the smalllest ones. She needs to confirm if this is the correct size. Please call patient back. [phone]

## 2022-05-29 ENCOUNTER — Encounter: Payer: Self-pay | Admitting: Oncology

## 2022-05-29 ENCOUNTER — Inpatient Hospital Stay: Payer: Medicare Other

## 2022-05-29 ENCOUNTER — Inpatient Hospital Stay (HOSPITAL_BASED_OUTPATIENT_CLINIC_OR_DEPARTMENT_OTHER): Payer: Medicare Other | Admitting: Oncology

## 2022-05-29 VITALS — BP 138/85 | HR 83 | Temp 97.7°F | Resp 18 | Wt 161.7 lb

## 2022-05-29 VITALS — BP 157/74 | HR 70

## 2022-05-29 DIAGNOSIS — C221 Intrahepatic bile duct carcinoma: Secondary | ICD-10-CM | POA: Diagnosis not present

## 2022-05-29 DIAGNOSIS — D6959 Other secondary thrombocytopenia: Secondary | ICD-10-CM

## 2022-05-29 DIAGNOSIS — E119 Type 2 diabetes mellitus without complications: Secondary | ICD-10-CM

## 2022-05-29 DIAGNOSIS — D6481 Anemia due to antineoplastic chemotherapy: Secondary | ICD-10-CM

## 2022-05-29 DIAGNOSIS — Z5111 Encounter for antineoplastic chemotherapy: Secondary | ICD-10-CM

## 2022-05-29 DIAGNOSIS — T451X5A Adverse effect of antineoplastic and immunosuppressive drugs, initial encounter: Secondary | ICD-10-CM

## 2022-05-29 DIAGNOSIS — F17210 Nicotine dependence, cigarettes, uncomplicated: Secondary | ICD-10-CM

## 2022-05-29 DIAGNOSIS — Z90710 Acquired absence of both cervix and uterus: Secondary | ICD-10-CM

## 2022-05-29 DIAGNOSIS — I10 Essential (primary) hypertension: Secondary | ICD-10-CM

## 2022-05-29 LAB — COMPREHENSIVE METABOLIC PANEL
ALT: 26 U/L (ref 0–44)
AST: 36 U/L (ref 15–41)
Albumin: 3 g/dL — ABNORMAL LOW (ref 3.5–5.0)
Alkaline Phosphatase: 328 U/L — ABNORMAL HIGH (ref 38–126)
Anion gap: 4 — ABNORMAL LOW (ref 5–15)
BUN: 19 mg/dL (ref 8–23)
CO2: 24 mmol/L (ref 22–32)
Calcium: 8.1 mg/dL — ABNORMAL LOW (ref 8.9–10.3)
Chloride: 111 mmol/L (ref 98–111)
Creatinine, Ser: 1.07 mg/dL — ABNORMAL HIGH (ref 0.44–1.00)
GFR, Estimated: 58 mL/min — ABNORMAL LOW (ref >60)
Glucose, Bld: 123 mg/dL — ABNORMAL HIGH (ref 70–99)
Potassium: 4.2 mmol/L (ref 3.5–5.1)
Sodium: 139 mmol/L (ref 135–145)
Total Bilirubin: 0.9 mg/dL (ref 0.3–1.2)
Total Protein: 7.9 g/dL (ref 6.5–8.1)

## 2022-05-29 LAB — CBC WITH DIFFERENTIAL/PLATELET
Abs Immature Granulocytes: 0.01 10*3/uL (ref 0.00–0.07)
Basophils Absolute: 0.1 10*3/uL (ref 0.0–0.1)
Basophils Relative: 1 %
Eosinophils Absolute: 0.2 10*3/uL (ref 0.0–0.5)
Eosinophils Relative: 3 %
HCT: 34 % — ABNORMAL LOW (ref 36.0–46.0)
Hemoglobin: 10.9 g/dL — ABNORMAL LOW (ref 12.0–15.0)
Immature Granulocytes: 0 %
Lymphocytes Relative: 29 %
Lymphs Abs: 2.1 10*3/uL (ref 0.7–4.0)
MCH: 32.9 pg (ref 26.0–34.0)
MCHC: 32.1 g/dL (ref 30.0–36.0)
MCV: 102.7 fL — ABNORMAL HIGH (ref 80.0–100.0)
Monocytes Absolute: 0.8 10*3/uL (ref 0.1–1.0)
Monocytes Relative: 11 %
Neutro Abs: 4.2 10*3/uL (ref 1.7–7.7)
Neutrophils Relative %: 56 %
Platelets: 82 10*3/uL — ABNORMAL LOW (ref 150–400)
RBC: 3.31 MIL/uL — ABNORMAL LOW (ref 3.87–5.11)
RDW: 21.2 % — ABNORMAL HIGH (ref 11.5–15.5)
WBC: 7.4 10*3/uL (ref 4.0–10.5)
nRBC: 0 % (ref 0.0–0.2)

## 2022-05-29 MED ORDER — DIPHENHYDRAMINE HCL 50 MG/ML IJ SOLN
50.0000 mg | Freq: Once | INTRAMUSCULAR | Status: AC
  Administered 2022-05-29: 50 mg via INTRAVENOUS
  Filled 2022-05-29: qty 1

## 2022-05-29 MED ORDER — HEPARIN SOD (PORK) LOCK FLUSH 100 UNIT/ML IV SOLN
500.0000 [IU] | Freq: Once | INTRAVENOUS | Status: AC | PRN
  Administered 2022-05-29: 500 [IU]
  Filled 2022-05-29: qty 5

## 2022-05-29 MED ORDER — SODIUM CHLORIDE 0.9 % IV SOLN
Freq: Once | INTRAVENOUS | Status: AC
  Filled 2022-05-29: qty 250

## 2022-05-29 MED ORDER — FAMOTIDINE IN NACL 20-0.9 MG/50ML-% IV SOLN
20.0000 mg | Freq: Once | INTRAVENOUS | Status: AC
  Administered 2022-05-29: 20 mg via INTRAVENOUS
  Filled 2022-05-29: qty 50

## 2022-05-29 MED ORDER — SODIUM CHLORIDE 0.9 % IV SOLN
126.0000 mg | Freq: Once | INTRAVENOUS | Status: AC
  Administered 2022-05-29: 130 mg via INTRAVENOUS
  Filled 2022-05-29: qty 13

## 2022-05-29 MED ORDER — PALONOSETRON HCL INJECTION 0.25 MG/5ML
0.2500 mg | Freq: Once | INTRAVENOUS | Status: AC
  Administered 2022-05-29: 0.25 mg via INTRAVENOUS

## 2022-05-29 MED ORDER — SODIUM CHLORIDE 0.9 % IV SOLN
1400.0000 mg | Freq: Once | INTRAVENOUS | Status: AC
  Administered 2022-05-29: 1400 mg via INTRAVENOUS
  Filled 2022-05-29: qty 26.3

## 2022-05-29 MED ORDER — SODIUM CHLORIDE 0.9 % IV SOLN
10.0000 mg | Freq: Once | INTRAVENOUS | Status: AC
  Administered 2022-05-29: 10 mg via INTRAVENOUS
  Filled 2022-05-29: qty 10

## 2022-05-29 NOTE — Progress Notes (Signed)
Hematology/Oncology Consult note [facility]  [phone] [phone]  Patient Care Team: Alba Cory, MD as PCP - General (Family Medicine) Benita Gutter, RN as Oncology Nurse Navigator [place], [place], LCSW as Social Worker Gaspar Cola, [place] (Pharmacist) Creig Hines, MD as Consulting Physician (Oncology) Borders, Daryl Eastern, NP as Nurse Practitioner [facility] and Palliative Medicine)   Name of the patient: Janice Short  [id number]  1961-11-23   Date of visit: 05/29/22  Diagnosis- metastatic intrahepatic cholangiocarcinoma  Chief complaint/ Reason for visit-on treatment assessment prior to cycle 12-day 15 of carboplatin gemcitabine chemotherapy  Heme/Onc history: Patient is a 61 year old African-American female with a past medical history significant for hypertension hyperlipidemia and type 2 diabetes who presented to the ER with symptoms of cramping abdominal pain and nausea.  This was followed by right upper quadrant ultrasound which showed multiple echogenic masses in the liver with nodular contour of the liver possibly suggestive of cirrhosis.  Several nodular masses in the vicinity of the pancreatic head and porta hepatic lymph nodes.  This was followed by an MRI abdomen and MRCP.  It showed right hepatic lobe masslike area measuring 4.9 x 2.9 cm.  Diffusion abnormality in the left hepatic lobe with mild left-sided biliary ductal dilatation and another area of abnormality measuring 1.4 x 1 cm.  Bulky celiac adenopathy 3 x 2.1 cm tracking into gastrohepatic recess.  Suspected portal venous invasion with convex protrusion in the right portal vein.  Left adrenal lesion measuring 2.9 x 1.5 cm.  Findings concerning for cholangiocarcinoma or biphenotypic cholangiole hepatocellular neoplasm.     Patient underwent CT-guided liver biopsy Which was consistent with adenocarcinoma poorly differentiated.  Immunohistochemistry showed CK7  positivity, CDX2 negative, Heppar negative, GATA3 negative.  CK19 positive.  Differentials include cholangiocarcinoma, pancreatic, upper GI.  Lung and breast less likely.  However given CK19 positivity most compatible with cholangiocarcinoma.   Patient's case was discussed at tumor board and it was felt as if the lymphadenopathy was more like a confluent primary tumor mass.  Patient was referred to [facility] for second opinion to see if she would be a candidate for surgery.However upon their review of the PET scan it was deemed that this was indeed periportal adenopathy.  They also did a CT chest abdomen and pelvis with MIPS protocol and she was also found to have portocaval lymph node measuring 2.4 cm which was more consistent with metastatic disease.  She was deemed unresectable   NGS testing showed BRCA2, FGFR2, PIK3CA, RAD 54L, CDC73, CDKN2 A/B CR EBBP rearrangement exon 31    Patient has been getting gemcitabine cisplatin chemotherapy 2 weeks on 1 week off since August 2022.  Patient had an allergic reaction to cisplatin and therefore was switched to carboplatin gemcitabine chemotherapy which she gets week on week off   Interval history-patient is tolerating chemotherapy well.  Denies any nausea or vomiting.  Denies any abdominal pain  ECOG PS- 1 Pain scale- 0   Review of systems- Review of Systems  Constitutional:  Positive for malaise/fatigue. Negative for chills, fever and weight loss.  HENT:  Negative for congestion, ear discharge and nosebleeds.   Eyes:  Negative for blurred vision.  Respiratory:  Negative for cough, hemoptysis, sputum production, shortness of breath and wheezing.   Cardiovascular:  Negative for chest pain, palpitations, orthopnea and claudication.  Gastrointestinal:  Negative for abdominal pain, blood in stool, constipation, diarrhea, heartburn, melena, nausea and vomiting.  Genitourinary:  Negative for  dysuria, flank pain, frequency, hematuria and urgency.   Musculoskeletal:  Negative for back pain, joint pain and myalgias.  Skin:  Negative for rash.  Neurological:  Negative for dizziness, tingling, focal weakness, seizures, weakness and headaches.  Endo/Heme/Allergies:  Does not bruise/bleed easily.  Psychiatric/Behavioral:  Negative for depression and suicidal ideas. The patient does not have insomnia.       Allergies  Allergen Reactions   Other Shortness Of Breath    Cat dander and grass pollen   Cisplatin Itching   Bydureon [Exenatide] Other (See Comments)    Pain with injection not allergy     Past Medical History:  Diagnosis Date   Allergy    Asthma    Carpal tunnel syndrome on left    Cervical radiculitis    Chronic osteoarthritis    Corns and callosity    Depression    Dermatophytosis of foot    Diabetes mellitus without complication (HCC)    Gout    Hyperlipidemia    Hypertension    Impingement syndrome of left shoulder    Intrahepatic cholangiocarcinoma (HCC)    Lumbosacral neuritis    Microalbuminuria    Obesity    Supraventricular tachycardia (HCC) 07/01/2015   SVT (supraventricular tachycardia) (HCC)    Tenosynovitis of wrist    Vitamin D deficiency      Past Surgical History:  Procedure Laterality Date   ABDOMINAL HYSTERECTOMY     COLONOSCOPY WITH PROPOFOL N/A 05/06/2021   Procedure: COLONOSCOPY WITH PROPOFOL;  Surgeon: Midge Minium, MD;  Location: [facility] ENDOSCOPY;  Service: Endoscopy;  Laterality: N/A;   ESOPHAGOGASTRODUODENOSCOPY (EGD) WITH PROPOFOL N/A 05/06/2021   Procedure: ESOPHAGOGASTRODUODENOSCOPY (EGD) WITH PROPOFOL;  Surgeon: Midge Minium, MD;  Location: [facility] ENDOSCOPY;  Service: Endoscopy;  Laterality: N/A;   PORTA CATH INSERTION N/A 07/17/2021   Procedure: PORTA CATH INSERTION;  Surgeon: Annice Needy, MD;  Location: [facility] INVASIVE CV LAB;  Service: Cardiovascular;  Laterality: N/A;    Social History   Socioeconomic History   Marital status: Single    Spouse name: Not on file   Number of  children: 2   Years of education: Not on file   Highest education level: Not on file  Occupational History   Occupation: disable     Comment: from chronic back pain, 2019  Tobacco Use   Smoking status: Some Days    Types: Cigarettes   Smokeless tobacco: Never   Tobacco comments:    1 per day 01/30/21 does not inhale it  Vaping Use   Vaping Use: Never used  Substance and Sexual Activity   Alcohol use: Not Currently   Drug use: No   Sexual activity: Not Currently  Other Topics Concern   Not on file  Social History Narrative   She is on disability for chronic back pain. Pt's son lives with her   Social Determinants of Health   Financial Resource Strain: Medium Risk (02/03/2022)   Overall Financial Resource Strain (CARDIA)    Difficulty of Paying Living Expenses: Somewhat hard  Food Insecurity: Food Insecurity Present (02/03/2022)   Hunger Vital Sign    Worried About Running Out of Food in the Last Year: Sometimes true    Ran Out of Food in the Last Year: Never true  Transportation Needs: No Transportation Needs (04/24/2022)   PRAPARE - Administrator, Civil Service (Medical): No    Lack of Transportation (Non-Medical): No  Physical Activity: Inactive (02/03/2022)   Exercise Vital Sign  Days of Exercise per Week: 0 days    Minutes of Exercise per Session: 0 min  Stress: No Stress Concern Present (02/03/2022)   [employer] of Occupational Health - Occupational Stress Questionnaire    Feeling of Stress : Only a little  Social Connections: Moderately Isolated (02/03/2022)   Social Connection and Isolation Panel [NHANES]    Frequency of Communication with Friends and Family: More than three times a week    Frequency of Social Gatherings with Friends and Family: Three times a week    Attends Religious Services: More than 4 times per year    Active Member of Clubs or Organizations: No    Attends Banker Meetings: Never    Marital Status: Never married   Intimate Partner Violence: Not At Risk (02/03/2022)   Humiliation, Afraid, Rape, and Kick questionnaire    Fear of Current or Ex-Partner: No    Emotionally Abused: No    Physically Abused: No    Sexually Abused: No    Family History  Problem Relation Age of Onset   Heart attack Mother    CAD Mother    Kidney disease Father      Current Outpatient Medications:    albuterol (VENTOLIN HFA) 108 (90 Base) MCG/ACT inhaler, INHALE 2 PUFFS BY MOUTH EVERY 6 HOURS AS NEEDED FOR WHEEZING AND FOR SHORTNESS OF BREATH, Disp: 18 g, Rfl: 2   aspirin 81 MG tablet, Take 1 tablet by mouth daily., Disp: , Rfl:    atorvastatin (LIPITOR) 40 MG tablet, Take 1 tablet (40 mg total) by mouth at bedtime., Disp: 90 tablet, Rfl: 1   blood glucose meter kit and supplies, Dispense based on patient and insurance preference. Use up to four times daily as directed. (FOR ICD-10 E10.9, E11.9)., Disp: 1 each, Rfl: 0   diphenhydramine-acetaminophen (TYLENOL PM) 25-500 MG TABS tablet, Take 1 tablet by mouth at bedtime as needed., Disp: , Rfl:    escitalopram (LEXAPRO) 20 MG tablet, TAKE ONE TABLET BY MOUTH DAILY AT 9AM, Disp: 90 tablet, Rfl: 1   fluticasone (FLONASE) 50 MCG/ACT nasal spray, USE 2 SPRAY(S) IN EACH NOSTRIL AT BEDTIME (Patient taking differently: Place 2 sprays into both nostrils daily as needed. USE 2 SPRAY(S) IN EACH NOSTRIL AT BEDTIME), Disp: 16 g, Rfl: 0   Fluticasone-Umeclidin-Vilant (TRELEGY ELLIPTA) 100-62.5-25 MCG/INH AEPB, Take 1 puff by mouth daily., Disp: 180 each, Rfl: 1   gabapentin (NEURONTIN) 300 MG capsule, TAKE TWO CAPSULES BY MOUTH TWICE DAILY @ 9AM & 5PM, Disp: 360 capsule, Rfl: 1   insulin degludec (TRESIBA FLEXTOUCH) 100 UNIT/ML FlexTouch Pen, INJECT 12-30 UNITS OF INSULIN INTO THE SKIN DAILY TO KEEP FASTING GLUCOSE BETWEEN 100-140, Disp: 15 mL, Rfl: 1   Insulin Pen Needle 31G X 8 MM MISC, 1 each by Does not apply route daily., Disp: 100 each, Rfl: 1   lidocaine-prilocaine (EMLA) cream,  Apply 1 application. topically as needed. Apply to port and cover with saran wrap 1-2 hours prior to port access, Disp: 30 g, Rfl: 3   ondansetron (ZOFRAN) 4 MG tablet, Take 1 tablet (4 mg total) by mouth every 8 (eight) hours as needed., Disp: 20 tablet, Rfl: 0   folic acid (FOLVITE) 1 MG tablet, Take 1 tablet (1 mg total) by mouth daily. (Patient not taking: Reported on 02/13/2022), Disp: 30 tablet, Rfl: 3 No current facility-administered medications for this visit.  Facility-Administered Medications Ordered in Other Visits:    heparin lock flush 100 UNIT/ML injection, , , ,  Physical exam:  Vitals:   05/29/22 0914  BP: 138/85  Pulse: 83  Resp: 18  Temp: 97.7 F (36.5 C)  TempSrc: Tympanic  SpO2: 99%  Weight: 161 lb 11.2 oz (73.3 kg)   Physical Exam Constitutional:      General: She is not in acute distress. Cardiovascular:     Rate and Rhythm: Normal rate and regular rhythm.     Heart sounds: Normal heart sounds.  Pulmonary:     Effort: Pulmonary effort is normal.     Breath sounds: Normal breath sounds.  Abdominal:     General: Bowel sounds are normal.     Palpations: Abdomen is soft.  Skin:    General: Skin is warm and dry.  Neurological:     Mental Status: She is alert and oriented to person, place, and time.         Latest Ref Rng & Units 05/29/2022    9:00 AM  CMP  Glucose 70 - 99 mg/dL 811   BUN 8 - 23 mg/dL 19   Creatinine 9.14 - 1.00 mg/dL 7.82   Sodium 956 - 213 mmol/L 139   Potassium 3.5 - 5.1 mmol/L 4.2   Chloride 98 - 111 mmol/L 111   CO2 22 - 32 mmol/L 24   Calcium 8.9 - 10.3 mg/dL 8.1   Total Protein 6.5 - 8.1 g/dL 7.9   Total Bilirubin 0.3 - 1.2 mg/dL 0.9   Alkaline Phos 38 - 126 U/L 328   AST 15 - 41 U/L 36   ALT 0 - 44 U/L 26       Latest Ref Rng & Units 05/29/2022    9:00 AM  CBC  WBC 4.0 - 10.5 K/uL 7.4   Hemoglobin 12.0 - 15.0 g/dL 08.6   Hematocrit 57.8 - 46.0 % 34.0   Platelets 150 - 400 K/uL 82     Assessment and plan- Patient  is a 61 y.o. female with stage IV intrahepatic cholangiocarcinoma.  She is here for on treatment assessment prior to cycle 12-day 15 of gemcitabine carboplatin chemotherapy  Counts okayTo proceed with cycle 12-day 15 of gemcitabine carboplatin chemotherapy today.  She does have thrombocytopenia with a platelet count of 82 but given that her platelet counts are more than 75 I will still proceed with chemotherapy and then decide if it still needs to be held 2 weeks from now.  She has baseline chemo-induced anemia which is overall stable.  Continue to monitor.  She will be due for a repeat scanSometime in August 2023.   Visit Diagnosis 1. Encounter for antineoplastic chemotherapy   2. Antineoplastic chemotherapy induced anemia   3. Chemotherapy-induced thrombocytopenia   4. Intrahepatic cholangiocarcinoma (HCC)      Dr. Owens Shark, MD, MPH [facility] [phone] 05/29/2022 3:12 PM

## 2022-05-30 ENCOUNTER — Other Ambulatory Visit: Payer: Self-pay | Admitting: Family Medicine

## 2022-05-30 DIAGNOSIS — E118 Type 2 diabetes mellitus with unspecified complications: Secondary | ICD-10-CM

## 2022-06-05 ENCOUNTER — Other Ambulatory Visit: Payer: Self-pay | Admitting: Oncology

## 2022-06-11 ENCOUNTER — Inpatient Hospital Stay: Payer: Medicare Other | Attending: Nurse Practitioner | Admitting: Hospice and Palliative Medicine

## 2022-06-11 DIAGNOSIS — D6481 Anemia due to antineoplastic chemotherapy: Secondary | ICD-10-CM | POA: Insufficient documentation

## 2022-06-11 DIAGNOSIS — E785 Hyperlipidemia, unspecified: Secondary | ICD-10-CM | POA: Insufficient documentation

## 2022-06-11 DIAGNOSIS — C221 Intrahepatic bile duct carcinoma: Secondary | ICD-10-CM | POA: Insufficient documentation

## 2022-06-11 DIAGNOSIS — I10 Essential (primary) hypertension: Secondary | ICD-10-CM | POA: Insufficient documentation

## 2022-06-11 DIAGNOSIS — E119 Type 2 diabetes mellitus without complications: Secondary | ICD-10-CM | POA: Insufficient documentation

## 2022-06-11 DIAGNOSIS — Z5111 Encounter for antineoplastic chemotherapy: Secondary | ICD-10-CM | POA: Insufficient documentation

## 2022-06-11 NOTE — Progress Notes (Signed)
Virtual Visit via Telephone Note  I connected with [name] on 06/11/22 at 10:50 AM EDT by telephone and verified that I am speaking with the correct person using two identifiers.  Location: Patient: Home Provider: Clinic   I discussed the limitations, risks, security and privacy concerns of performing an evaluation and management service by telephone and the availability of in person appointments. I also discussed with the patient that there may be a patient responsible charge related to this service. The patient expressed understanding and agreed to proceed.   History of Present Illness: Janice Short is a 61 y.o. female with multiple medical problems including hypertension, hyperlipidemia, and type 2 diabetes, and stage IV cholangiocarcinoma.  Patient is on treatment with systemic chemotherapy. She was referred to palliative care to help address goals.    Observations/Objective: I spoke with patient by phone.  She reports that she is doing well.  She feels like her appetite is increased and she has gained some weight.  She denies any symptomatic concerns at present.  Patient says that she is unable to make her treatment appointment tomorrow and requested to reschedule.   Assessment and Plan: Stage IV cholangiocarcinoma -on systemic chemotherapy.  Seems to be doing well clinically without any significant symptomatic burden at present.  Will follow.  Follow Up Instructions: Follow-up telephone visit 2 to 3 months   I discussed the assessment and treatment plan with the patient. The patient was provided an opportunity to ask questions and all were answered. The patient agreed with the plan and demonstrated an understanding of the instructions.   The patient was advised to call back or seek an in-person evaluation if the symptoms worsen or if the condition fails to improve as anticipated.  I provided 5 minutes of non-face-to-face time during this encounter.   Irean Hong,  NP

## 2022-06-12 ENCOUNTER — Ambulatory Visit: Payer: Medicare Other | Admitting: Oncology

## 2022-06-12 ENCOUNTER — Other Ambulatory Visit: Payer: Medicare Other

## 2022-06-12 ENCOUNTER — Ambulatory Visit: Payer: Medicare Other

## 2022-06-15 ENCOUNTER — Ambulatory Visit: Payer: Medicare Other

## 2022-06-19 ENCOUNTER — Inpatient Hospital Stay: Payer: Medicare Other

## 2022-06-19 ENCOUNTER — Encounter: Payer: Self-pay | Admitting: Oncology

## 2022-06-19 ENCOUNTER — Inpatient Hospital Stay (HOSPITAL_BASED_OUTPATIENT_CLINIC_OR_DEPARTMENT_OTHER): Payer: Medicare Other | Admitting: Oncology

## 2022-06-19 VITALS — BP 120/72 | HR 84 | Temp 96.5°F | Resp 16 | Wt 163.5 lb

## 2022-06-19 DIAGNOSIS — T451X5A Adverse effect of antineoplastic and immunosuppressive drugs, initial encounter: Secondary | ICD-10-CM

## 2022-06-19 DIAGNOSIS — E119 Type 2 diabetes mellitus without complications: Secondary | ICD-10-CM | POA: Diagnosis not present

## 2022-06-19 DIAGNOSIS — D6481 Anemia due to antineoplastic chemotherapy: Secondary | ICD-10-CM

## 2022-06-19 DIAGNOSIS — C221 Intrahepatic bile duct carcinoma: Secondary | ICD-10-CM | POA: Diagnosis present

## 2022-06-19 DIAGNOSIS — Z5111 Encounter for antineoplastic chemotherapy: Secondary | ICD-10-CM | POA: Diagnosis present

## 2022-06-19 DIAGNOSIS — E785 Hyperlipidemia, unspecified: Secondary | ICD-10-CM | POA: Diagnosis not present

## 2022-06-19 DIAGNOSIS — I10 Essential (primary) hypertension: Secondary | ICD-10-CM | POA: Diagnosis not present

## 2022-06-19 LAB — CBC WITH DIFFERENTIAL/PLATELET
Abs Immature Granulocytes: 0.03 10*3/uL (ref 0.00–0.07)
Basophils Absolute: 0.1 10*3/uL (ref 0.0–0.1)
Basophils Relative: 1 %
Eosinophils Absolute: 0.2 10*3/uL (ref 0.0–0.5)
Eosinophils Relative: 2 %
HCT: 35 % — ABNORMAL LOW (ref 36.0–46.0)
Hemoglobin: 11.3 g/dL — ABNORMAL LOW (ref 12.0–15.0)
Immature Granulocytes: 0 %
Lymphocytes Relative: 32 %
Lymphs Abs: 2.3 10*3/uL (ref 0.7–4.0)
MCH: 33.4 pg (ref 26.0–34.0)
MCHC: 32.3 g/dL (ref 30.0–36.0)
MCV: 103.6 fL — ABNORMAL HIGH (ref 80.0–100.0)
Monocytes Absolute: 1 10*3/uL (ref 0.1–1.0)
Monocytes Relative: 14 %
Neutro Abs: 3.6 10*3/uL (ref 1.7–7.7)
Neutrophils Relative %: 51 %
Platelets: 166 10*3/uL (ref 150–400)
RBC: 3.38 MIL/uL — ABNORMAL LOW (ref 3.87–5.11)
RDW: 20.4 % — ABNORMAL HIGH (ref 11.5–15.5)
WBC: 7.1 10*3/uL (ref 4.0–10.5)
nRBC: 0 % (ref 0.0–0.2)

## 2022-06-19 LAB — COMPREHENSIVE METABOLIC PANEL
ALT: 23 U/L (ref 0–44)
AST: 35 U/L (ref 15–41)
Albumin: 3 g/dL — ABNORMAL LOW (ref 3.5–5.0)
Alkaline Phosphatase: 307 U/L — ABNORMAL HIGH (ref 38–126)
Anion gap: 4 — ABNORMAL LOW (ref 5–15)
BUN: 19 mg/dL (ref 8–23)
CO2: 22 mmol/L (ref 22–32)
Calcium: 8 mg/dL — ABNORMAL LOW (ref 8.9–10.3)
Chloride: 109 mmol/L (ref 98–111)
Creatinine, Ser: 1.21 mg/dL — ABNORMAL HIGH (ref 0.44–1.00)
GFR, Estimated: 50 mL/min — ABNORMAL LOW (ref >60)
Glucose, Bld: 106 mg/dL — ABNORMAL HIGH (ref 70–99)
Potassium: 4.9 mmol/L (ref 3.5–5.1)
Sodium: 135 mmol/L (ref 135–145)
Total Bilirubin: 0.7 mg/dL (ref 0.3–1.2)
Total Protein: 7.9 g/dL (ref 6.5–8.1)

## 2022-06-19 MED ORDER — SODIUM CHLORIDE 0.9 % IV SOLN
Freq: Once | INTRAVENOUS | Status: AC
  Filled 2022-06-19: qty 250

## 2022-06-19 MED ORDER — SODIUM CHLORIDE 0.9 % IV SOLN
126.0000 mg | Freq: Once | INTRAVENOUS | Status: AC
  Administered 2022-06-19: 130 mg via INTRAVENOUS
  Filled 2022-06-19: qty 13

## 2022-06-19 MED ORDER — PALONOSETRON HCL INJECTION 0.25 MG/5ML
0.2500 mg | Freq: Once | INTRAVENOUS | Status: AC
  Administered 2022-06-19: 0.25 mg via INTRAVENOUS
  Filled 2022-06-19: qty 5

## 2022-06-19 MED ORDER — SODIUM CHLORIDE 0.9 % IV SOLN: 1400.0000 mg | Freq: Once | INTRAVENOUS | Status: DC

## 2022-06-19 MED ORDER — HEPARIN SOD (PORK) LOCK FLUSH 100 UNIT/ML IV SOLN
500.0000 [IU] | Freq: Once | INTRAVENOUS | Status: AC
  Administered 2022-06-19: 500 [IU] via INTRAVENOUS
  Filled 2022-06-19: qty 5

## 2022-06-19 MED ORDER — SODIUM CHLORIDE 0.9 % IV SOLN
1400.0000 mg | Freq: Once | INTRAVENOUS | Status: AC
  Administered 2022-06-19: 1400 mg via INTRAVENOUS
  Filled 2022-06-19: qty 10.52

## 2022-06-19 MED ORDER — SODIUM CHLORIDE 0.9 % IV SOLN: 126.0000 mg | Freq: Once | INTRAVENOUS | Status: DC

## 2022-06-19 MED ORDER — FAMOTIDINE IN NACL 20-0.9 MG/50ML-% IV SOLN
20.0000 mg | Freq: Once | INTRAVENOUS | Status: AC
  Administered 2022-06-19: 20 mg via INTRAVENOUS
  Filled 2022-06-19: qty 50

## 2022-06-19 MED ORDER — DIPHENHYDRAMINE HCL 50 MG/ML IJ SOLN
50.0000 mg | Freq: Once | INTRAMUSCULAR | Status: AC
  Administered 2022-06-19: 50 mg via INTRAVENOUS
  Filled 2022-06-19: qty 1

## 2022-06-19 MED ORDER — SODIUM CHLORIDE 0.9% FLUSH
10.0000 mL | Freq: Once | INTRAVENOUS | Status: AC
  Administered 2022-06-19: 10 mL via INTRAVENOUS
  Filled 2022-06-19: qty 10

## 2022-06-19 MED ORDER — SODIUM CHLORIDE 0.9 % IV SOLN
10.0000 mg | Freq: Once | INTRAVENOUS | Status: AC
  Administered 2022-06-19: 10 mg via INTRAVENOUS
  Filled 2022-06-19: qty 1

## 2022-06-19 NOTE — Patient Instructions (Signed)
[facility]  Discharge Instructions: Thank you for choosing [facility] to provide your oncology and hematology care.  If you have a lab appointment with the Cancer Center, please go directly to the Cancer Center and check in at the registration area.  Wear comfortable clothing and clothing appropriate for easy access to any Portacath or PICC line.   We strive to give you quality time with your provider. You may need to reschedule your appointment if you arrive late (15 or more minutes).  Arriving late affects you and other patients whose appointments are after yours.  Also, if you miss three or more appointments without notifying the office, you may be dismissed from the clinic at the provider's discretion.      For prescription refill requests, have your pharmacy contact our office and allow 72 hours for refills to be completed.       To help prevent nausea and vomiting after your treatment, we encourage you to take your nausea medication as directed.  BELOW ARE SYMPTOMS THAT SHOULD BE REPORTED IMMEDIATELY: *FEVER GREATER THAN 100.4 F (38 C) OR HIGHER *CHILLS OR SWEATING *NAUSEA AND VOMITING THAT IS NOT CONTROLLED WITH YOUR NAUSEA MEDICATION *UNUSUAL SHORTNESS OF BREATH *UNUSUAL BRUISING OR BLEEDING *URINARY PROBLEMS (pain or burning when urinating, or frequent urination) *BOWEL PROBLEMS (unusual diarrhea, constipation, pain near the anus) TENDERNESS IN MOUTH AND THROAT WITH OR WITHOUT PRESENCE OF ULCERS (sore throat, sores in mouth, or a toothache) UNUSUAL RASH, SWELLING OR PAIN  UNUSUAL VAGINAL DISCHARGE OR ITCHING   Items with * indicate a potential emergency and should be followed up as soon as possible or go to the Emergency Department if any problems should occur.  Please show the CHEMOTHERAPY ALERT CARD or IMMUNOTHERAPY ALERT CARD at check-in to the Emergency Department and triage nurse.  Should you have questions after your  visit or need to cancel or reschedule your appointment, please contact [facility]  [phone] and follow the prompts.  Office hours are 8:00 a.m. to 4:30 p.m. Monday - Friday. Please note that voicemails left after 4:00 p.m. may not be returned until the following business day.  We are closed weekends and major holidays. You have access to a nurse at all times for urgent questions. Please call the main number to the clinic [phone] and follow the prompts.  For any non-urgent questions, you may also contact your provider using MyChart. We now offer e-Visits for anyone 18 and older to request care online for non-urgent symptoms. For details visit [email or web address].   Also download the MyChart app! Go to the app store, search "MyChart", open the app, select [place], and log in with your MyChart username and password.  Masks are optional in the cancer centers. If you would like for your care team to wear a mask while they are taking care of you, please let them know. For doctor visits, patients may have with them one support person who is at least 63 years old. At this time, visitors are not allowed in the infusion area.   

## 2022-06-20 ENCOUNTER — Encounter: Payer: Self-pay | Admitting: Oncology

## 2022-06-20 NOTE — Progress Notes (Signed)
Hematology/Oncology Consult note [facility]  [phone] [phone]  Patient Care Team: Steele Sizer, MD as PCP - General (Family Medicine) Clent Jacks, RN as Oncology Nurse Navigator Germaine Pomfret, [facility] (Pharmacist) Sindy Guadeloupe, MD as Consulting Physician (Oncology) Borders, Kirt Boys, NP as Nurse Practitioner [facility] and Palliative Medicine)   Name of the patient: Janice Short  [id number]  09/14/1961   Date of visit: 06/20/22  Diagnosis- metastatic intrahepatic cholangiocarcinoma  Chief complaint/ Reason for visit-on treatment assessment prior to cycle 13-day 1 of carboplatin gemcitabine chemotherapy  Heme/Onc history: Patient is a 61 year old African-American female with a past medical history significant for hypertension hyperlipidemia and type 2 diabetes who presented to the ER with symptoms of cramping abdominal pain and nausea.  This was followed by right upper quadrant ultrasound which showed multiple echogenic masses in the liver with nodular contour of the liver possibly suggestive of cirrhosis.  Several nodular masses in the vicinity of the pancreatic head and porta hepatic lymph nodes.  This was followed by an MRI abdomen and MRCP.  It showed right hepatic lobe masslike area measuring 4.9 x 2.9 cm.  Diffusion abnormality in the left hepatic lobe with mild left-sided biliary ductal dilatation and another area of abnormality measuring 1.4 x 1 cm.  Bulky celiac adenopathy 3 x 2.1 cm tracking into gastrohepatic recess.  Suspected portal venous invasion with convex protrusion in the right portal vein.  Left adrenal lesion measuring 2.9 x 1.5 cm.  Findings concerning for cholangiocarcinoma or biphenotypic cholangiole hepatocellular neoplasm.     Patient underwent CT-guided liver biopsy Which was consistent with adenocarcinoma poorly differentiated.  Immunohistochemistry showed CK7 positivity, CDX2 negative, Heppar negative,  GATA3 negative.  CK19 positive.  Differentials include cholangiocarcinoma, pancreatic, upper GI.  Lung and breast less likely.  However given CK19 positivity most compatible with cholangiocarcinoma.   Patient's case was discussed at tumor board and it was felt as if the lymphadenopathy was more like a confluent primary tumor mass.  Patient was referred to [facility] for second opinion to see if she would be a candidate for surgery.However upon their review of the PET scan it was deemed that this was indeed periportal adenopathy.  They also did a CT chest abdomen and pelvis with MIPS protocol and she was also found to have portocaval lymph node measuring 2.4 cm which was more consistent with metastatic disease.  She was deemed unresectable   NGS testing showed BRCA2, FGFR2, PIK3CA, RAD 54L, CDC73, CDKN2 A/B CR EBBP rearrangement exon 31    Patient has been getting gemcitabine cisplatin chemotherapy 2 weeks on 1 week off since August 2022.  Patient had an allergic reaction to cisplatin and therefore was switched to carboplatin gemcitabine chemotherapy which she gets week on week off    Interval history-patient is doing well overall.  Denies any complaints at this time denies any nausea vomiting diarrhea.  Denies any abdominal pain  ECOG PS- 1 Pain scale- 0   Review of systems- Review of Systems  Constitutional:  Negative for chills, fever, malaise/fatigue and weight loss.  HENT:  Negative for congestion, ear discharge and nosebleeds.   Eyes:  Negative for blurred vision.  Respiratory:  Negative for cough, hemoptysis, sputum production, shortness of breath and wheezing.   Cardiovascular:  Negative for chest pain, palpitations, orthopnea and claudication.  Gastrointestinal:  Negative for abdominal pain, blood in stool, constipation, diarrhea, heartburn, melena, nausea and vomiting.  Genitourinary:  Negative for dysuria, flank  pain, frequency, hematuria and urgency.  Musculoskeletal:  Negative for back  pain, joint pain and myalgias.  Skin:  Negative for rash.  Neurological:  Negative for dizziness, tingling, focal weakness, seizures, weakness and headaches.  Endo/Heme/Allergies:  Does not bruise/bleed easily.  Psychiatric/Behavioral:  Negative for depression and suicidal ideas. The patient does not have insomnia.       Allergies  Allergen Reactions   Other Shortness Of Breath    Cat dander and grass pollen   Cisplatin Itching   Bydureon [Exenatide] Other (See Comments)    Pain with injection not allergy     Past Medical History:  Diagnosis Date   Allergy    Asthma    Carpal tunnel syndrome on left    Cervical radiculitis    Chronic osteoarthritis    Corns and callosity    Depression    Dermatophytosis of foot    Diabetes mellitus without complication (HCC)    Gout    Hyperlipidemia    Hypertension    Impingement syndrome of left shoulder    Intrahepatic cholangiocarcinoma (HCC)    Lumbosacral neuritis    Microalbuminuria    Obesity    Supraventricular tachycardia ([place]) 07/01/2015   SVT (supraventricular tachycardia) (HCC)    Tenosynovitis of wrist    Vitamin D deficiency      Past Surgical History:  Procedure Laterality Date   ABDOMINAL HYSTERECTOMY     COLONOSCOPY WITH PROPOFOL N/A 05/06/2021   Procedure: COLONOSCOPY WITH PROPOFOL;  Surgeon: Lucilla Lame, MD;  Location: [facility] ENDOSCOPY;  Service: Endoscopy;  Laterality: N/A;   ESOPHAGOGASTRODUODENOSCOPY (EGD) WITH PROPOFOL N/A 05/06/2021   Procedure: ESOPHAGOGASTRODUODENOSCOPY (EGD) WITH PROPOFOL;  Surgeon: Lucilla Lame, MD;  Location: [facility] ENDOSCOPY;  Service: Endoscopy;  Laterality: N/A;   PORTA CATH INSERTION N/A 07/17/2021   Procedure: PORTA CATH INSERTION;  Surgeon: Algernon Huxley, MD;  Location: [place] CV LAB;  Service: Cardiovascular;  Laterality: N/A;    Social History   Socioeconomic History   Marital status: Single    Spouse name: Not on file   Number of children: 2   Years of education: Not  on file   Highest education level: Not on file  Occupational History   Occupation: disable     Comment: from chronic back pain, 2019  Tobacco Use   Smoking status: Some Days    Types: Cigarettes   Smokeless tobacco: Never   Tobacco comments:    1 per day 01/30/21 does not inhale it  Vaping Use   Vaping Use: Never used  Substance and Sexual Activity   Alcohol use: Not Currently   Drug use: No   Sexual activity: Not Currently  Other Topics Concern   Not on file  Social History Narrative   She is on disability for chronic back pain. Pt's son lives with her   Social Determinants of Health   Financial Resource Strain: Medium Risk (02/03/2022)   Overall Financial Resource Strain (CARDIA)    Difficulty of Paying Living Expenses: Somewhat hard  Food Insecurity: Food Insecurity Present (02/03/2022)   Hunger Vital Sign    Worried About Running Out of Food in the Last Year: Sometimes true    Ran Out of Food in the Last Year: Never true  Transportation Needs: No Transportation Needs (04/24/2022)   PRAPARE - Hydrologist (Medical): No    Lack of Transportation (Non-Medical): No  Physical Activity: Inactive (02/03/2022)   Exercise Vital Sign    Days of  Exercise per Week: 0 days    Minutes of Exercise per Session: 0 min  Stress: No Stress Concern Present (02/03/2022)   [place]    Feeling of Stress : Only a little  Social Connections: Moderately Isolated (02/03/2022)   Social Connection and Isolation Panel [NHANES]    Frequency of Communication with Friends and Family: More than three times a week    Frequency of Social Gatherings with Friends and Family: Three times a week    Attends Religious Services: More than 4 times per year    Active Member of Clubs or Organizations: No    Attends Archivist Meetings: Never    Marital Status: Never married  Intimate Partner Violence: Not At  Risk (02/03/2022)   Humiliation, Afraid, Rape, and Kick questionnaire    Fear of Current or Ex-Partner: No    Emotionally Abused: No    Physically Abused: No    Sexually Abused: No    Family History  Problem Relation Age of Onset   Heart attack Mother    CAD Mother    Kidney disease Father      Current Outpatient Medications:    albuterol (VENTOLIN HFA) 108 (90 Base) MCG/ACT inhaler, INHALE 2 PUFFS BY MOUTH EVERY 6 HOURS AS NEEDED FOR WHEEZING AND FOR SHORTNESS OF BREATH, Disp: 18 g, Rfl: 2   aspirin 81 MG tablet, Take 1 tablet by mouth daily., Disp: , Rfl:    atorvastatin (LIPITOR) 40 MG tablet, Take 1 tablet (40 mg total) by mouth at bedtime., Disp: 90 tablet, Rfl: 1   blood glucose meter kit and supplies, Dispense based on patient and insurance preference. Use up to four times daily as directed. (FOR ICD-10 E10.9, E11.9)., Disp: 1 each, Rfl: 0   diphenhydramine-acetaminophen (TYLENOL PM) 25-500 MG TABS tablet, Take 1 tablet by mouth at bedtime as needed., Disp: , Rfl:    escitalopram (LEXAPRO) 20 MG tablet, TAKE ONE TABLET BY MOUTH DAILY AT 9AM, Disp: 90 tablet, Rfl: 1   fluticasone (FLONASE) 50 MCG/ACT nasal spray, USE 2 SPRAY(S) IN EACH NOSTRIL AT BEDTIME (Patient taking differently: Place 2 sprays into both nostrils daily as needed. USE 2 SPRAY(S) IN EACH NOSTRIL AT BEDTIME), Disp: 16 g, Rfl: 0   Fluticasone-Umeclidin-Vilant (TRELEGY ELLIPTA) 100-62.5-25 MCG/INH AEPB, Take 1 puff by mouth daily., Disp: 180 each, Rfl: 1   gabapentin (NEURONTIN) 300 MG capsule, TAKE TWO CAPSULES BY MOUTH TWICE DAILY @ 9AM & 5PM, Disp: 360 capsule, Rfl: 1   insulin degludec (TRESIBA FLEXTOUCH) 100 UNIT/ML FlexTouch Pen, INJECT 12-30 UNITS OF INSULIN INTO THE SKIN DAILY TO KEEP FASTING GLUCOSE BETWEEN 100-140, Disp: 15 mL, Rfl: 1   Insulin Pen Needle 31G X 8 MM MISC, 1 each by Does not apply route daily., Disp: 100 each, Rfl: 1   lidocaine-prilocaine (EMLA) cream, Apply 1 application. topically as  needed. Apply to port and cover with saran wrap 1-2 hours prior to port access, Disp: 30 g, Rfl: 3   folic acid (FOLVITE) 1 MG tablet, Take 1 tablet (1 mg total) by mouth daily. (Patient not taking: Reported on 02/13/2022), Disp: 30 tablet, Rfl: 3   ondansetron (ZOFRAN) 4 MG tablet, Take 1 tablet (4 mg total) by mouth every 8 (eight) hours as needed. (Patient not taking: Reported on 06/19/2022), Disp: 20 tablet, Rfl: 0 No current facility-administered medications for this visit.  Facility-Administered Medications Ordered in Other Visits:    heparin lock flush 100 UNIT/ML injection, , , ,  Physical exam:  Vitals:   06/19/22 1127  BP: 120/72  Pulse: 84  Resp: 16  Temp: (!) 96.5 F (35.8 C)  SpO2: 100%  Weight: 163 lb 8 oz (74.2 kg)   Physical Exam Constitutional:      General: She is not in acute distress. Cardiovascular:     Rate and Rhythm: Normal rate and regular rhythm.     Heart sounds: Normal heart sounds.  Pulmonary:     Effort: Pulmonary effort is normal.     Breath sounds: Normal breath sounds.  Abdominal:     General: Bowel sounds are normal.     Palpations: Abdomen is soft.  Skin:    General: Skin is warm and dry.  Neurological:     Mental Status: She is alert and oriented to person, place, and time.         Latest Ref Rng & Units 06/19/2022   10:57 AM  CMP  Glucose 70 - 99 mg/dL 106   BUN 8 - 23 mg/dL 19   Creatinine 0.44 - 1.00 mg/dL 1.21   Sodium 135 - 145 mmol/L 135   Potassium 3.5 - 5.1 mmol/L 4.9   Chloride 98 - 111 mmol/L 109   CO2 22 - 32 mmol/L 22   Calcium 8.9 - 10.3 mg/dL 8.0   Total Protein 6.5 - 8.1 g/dL 7.9   Total Bilirubin 0.3 - 1.2 mg/dL 0.7   Alkaline Phos 38 - 126 U/L 307   AST 15 - 41 U/L 35   ALT 0 - 44 U/L 23       Latest Ref Rng & Units 06/19/2022   10:57 AM  CBC  WBC 4.0 - 10.5 K/uL 7.1   Hemoglobin 12.0 - 15.0 g/dL 11.3   Hematocrit 36.0 - 46.0 % 35.0   Platelets 150 - 400 K/uL 166      Assessment and plan- Patient is a  61 y.o. female  with stage IV intrahepatic cholangiocarcinoma.  She is here for on treatment assessment prior to cycle 14-day 1 of carboplatin gemcitabine chemotherapy  Counts okay to proceed with cycle 14-day 1 of carboplatin gemcitabine chemotherapy today.  She is getting treatment week on week off.  She does not require any Udenyca with this cycle.  She will directly proceed for cycle 14-day 15 of treatment in 2 weeks.  See covering NP for labs and treatment in 4 weeks and I will see her back in 6 weeks for labs and treatment with scans and tumor markers prior   Visit Diagnosis 1. Intrahepatic cholangiocarcinoma ([place])   2. Encounter for antineoplastic chemotherapy   3. Antineoplastic chemotherapy induced anemia      Dr. Randa Evens, MD, MPH [facility] [phone] 06/20/2022 8:20 PM

## 2022-07-02 MED FILL — Dexamethasone Sodium Phosphate Inj 100 MG/10ML: INTRAMUSCULAR | Qty: 1 | Status: AC

## 2022-07-03 ENCOUNTER — Inpatient Hospital Stay: Payer: Medicare Other

## 2022-07-03 ENCOUNTER — Other Ambulatory Visit: Payer: Self-pay | Admitting: Family Medicine

## 2022-07-03 VITALS — BP 126/68 | HR 80 | Resp 16 | Wt 161.6 lb

## 2022-07-03 DIAGNOSIS — Z5111 Encounter for antineoplastic chemotherapy: Secondary | ICD-10-CM | POA: Diagnosis not present

## 2022-07-03 DIAGNOSIS — C221 Intrahepatic bile duct carcinoma: Secondary | ICD-10-CM

## 2022-07-03 DIAGNOSIS — J302 Other seasonal allergic rhinitis: Secondary | ICD-10-CM

## 2022-07-03 DIAGNOSIS — R809 Proteinuria, unspecified: Secondary | ICD-10-CM

## 2022-07-03 LAB — CBC WITH DIFFERENTIAL/PLATELET
Abs Immature Granulocytes: 0.03 10*3/uL (ref 0.00–0.07)
Basophils Absolute: 0 10*3/uL (ref 0.0–0.1)
Basophils Relative: 0 %
Eosinophils Absolute: 0.2 10*3/uL (ref 0.0–0.5)
Eosinophils Relative: 3 %
HCT: 32.8 % — ABNORMAL LOW (ref 36.0–46.0)
Hemoglobin: 10.7 g/dL — ABNORMAL LOW (ref 12.0–15.0)
Immature Granulocytes: 0 %
Lymphocytes Relative: 28 %
Lymphs Abs: 2.1 10*3/uL (ref 0.7–4.0)
MCH: 34.2 pg — ABNORMAL HIGH (ref 26.0–34.0)
MCHC: 32.6 g/dL (ref 30.0–36.0)
MCV: 104.8 fL — ABNORMAL HIGH (ref 80.0–100.0)
Monocytes Absolute: 0.6 10*3/uL (ref 0.1–1.0)
Monocytes Relative: 8 %
Neutro Abs: 4.5 10*3/uL (ref 1.7–7.7)
Neutrophils Relative %: 61 %
Platelets: 87 10*3/uL — ABNORMAL LOW (ref 150–400)
RBC: 3.13 MIL/uL — ABNORMAL LOW (ref 3.87–5.11)
RDW: 19.4 % — ABNORMAL HIGH (ref 11.5–15.5)
WBC: 7.4 10*3/uL (ref 4.0–10.5)
nRBC: 0 % (ref 0.0–0.2)

## 2022-07-03 LAB — COMPREHENSIVE METABOLIC PANEL
ALT: 26 U/L (ref 0–44)
AST: 32 U/L (ref 15–41)
Albumin: 3.1 g/dL — ABNORMAL LOW (ref 3.5–5.0)
Alkaline Phosphatase: 285 U/L — ABNORMAL HIGH (ref 38–126)
Anion gap: 3 — ABNORMAL LOW (ref 5–15)
BUN: 17 mg/dL (ref 8–23)
CO2: 21 mmol/L — ABNORMAL LOW (ref 22–32)
Calcium: 8.4 mg/dL — ABNORMAL LOW (ref 8.9–10.3)
Chloride: 114 mmol/L — ABNORMAL HIGH (ref 98–111)
Creatinine, Ser: 1.33 mg/dL — ABNORMAL HIGH (ref 0.44–1.00)
GFR, Estimated: 45 mL/min — ABNORMAL LOW (ref >60)
Glucose, Bld: 162 mg/dL — ABNORMAL HIGH (ref 70–99)
Potassium: 4.7 mmol/L (ref 3.5–5.1)
Sodium: 138 mmol/L (ref 135–145)
Total Bilirubin: 0.9 mg/dL (ref 0.3–1.2)
Total Protein: 7.8 g/dL (ref 6.5–8.1)

## 2022-07-03 MED ORDER — SODIUM CHLORIDE 0.9 % IV SOLN
112.0000 mg | Freq: Once | INTRAVENOUS | Status: AC
  Administered 2022-07-03: 110 mg via INTRAVENOUS
  Filled 2022-07-03: qty 11

## 2022-07-03 MED ORDER — FAMOTIDINE IN NACL 20-0.9 MG/50ML-% IV SOLN
20.0000 mg | Freq: Once | INTRAVENOUS | Status: AC
  Administered 2022-07-03: 20 mg via INTRAVENOUS

## 2022-07-03 MED ORDER — DIPHENHYDRAMINE HCL 50 MG/ML IJ SOLN
50.0000 mg | Freq: Once | INTRAMUSCULAR | Status: AC
  Administered 2022-07-03: 50 mg via INTRAVENOUS
  Filled 2022-07-03: qty 1

## 2022-07-03 MED ORDER — SODIUM CHLORIDE 0.9 % IV SOLN
10.0000 mg | Freq: Once | INTRAVENOUS | Status: AC
  Administered 2022-07-03: 10 mg via INTRAVENOUS
  Filled 2022-07-03: qty 10

## 2022-07-03 MED ORDER — SODIUM CHLORIDE 0.9 % IV SOLN
Freq: Once | INTRAVENOUS | Status: AC
  Filled 2022-07-03: qty 250

## 2022-07-03 MED ORDER — PALONOSETRON HCL INJECTION 0.25 MG/5ML
0.2500 mg | Freq: Once | INTRAVENOUS | Status: AC
  Administered 2022-07-03: 0.25 mg via INTRAVENOUS

## 2022-07-03 MED ORDER — SODIUM CHLORIDE 0.9% FLUSH
10.0000 mL | Freq: Once | INTRAVENOUS | Status: AC
  Administered 2022-07-03: 10 mL via INTRAVENOUS
  Filled 2022-07-03: qty 10

## 2022-07-03 MED ORDER — HEPARIN SOD (PORK) LOCK FLUSH 100 UNIT/ML IV SOLN
500.0000 [IU] | Freq: Once | INTRAVENOUS | Status: AC | PRN
  Administered 2022-07-03: 500 [IU]
  Filled 2022-07-03: qty 5

## 2022-07-03 MED ORDER — SODIUM CHLORIDE 0.9 % IV SOLN
1400.0000 mg | Freq: Once | INTRAVENOUS | Status: AC
  Administered 2022-07-03: 1400 mg via INTRAVENOUS
  Filled 2022-07-03: qty 36.82

## 2022-07-03 NOTE — Progress Notes (Signed)
Per MD will decrease Carboplatin dose from 130 to 110mg  due to increase of SCr.

## 2022-07-03 NOTE — Telephone Encounter (Unsigned)
Copied from [place] [id number]. Topic: General - Other >> Jul 03, 2022  4:47 PM Everette C wrote: Reason for CRM: Medication Refill - Medication: Insulin Pen Needle 31G X 8 MM MISC [[id number]]   fluticasone (FLONASE) 50 MCG/ACT nasal spray [[id number]]   Has the patient contacted their pharmacy? Yes.  The patient has been directed to contact their PCP. The patient originally wanted to use [facility] but has been unable to get in contact with them (Agent: If no, request that the patient contact the pharmacy for the refill. If patient does not wish to contact the pharmacy document the reason why and proceed with request.) (Agent: If yes, when and what did the pharmacy advise?)  Preferred Pharmacy (with phone number or street name): [place], [place] - [address] [place] [address] Phone: [phone] Fax: [phone] Hours: Not open 24 hours   Has the patient been seen for an appointment in the last year OR does the patient have an upcoming appointment? Yes.    Agent: Please be advised that RX refills may take up to 3 business days. We ask that you follow-up with your pharmacy.

## 2022-07-03 NOTE — Patient Instructions (Signed)
[facility] CANCER CTR AT [place]  Discharge Instructions: Thank you for choosing [facility] to provide your oncology and hematology care.  If you have a lab appointment with the [place] [facility], please go directly to the [place] and check in at the registration area.  Wear comfortable clothing and clothing appropriate for easy access to any Portacath or PICC line.   We strive to give you quality time with your provider. You may need to reschedule your appointment if you arrive late (15 or more minutes).  Arriving late affects you and other patients whose appointments are after yours.  Also, if you miss three or more appointments without notifying the office, you may be dismissed from the clinic at the provider's discretion.      For prescription refill requests, have your pharmacy contact our office and allow 72 hours for refills to be completed.    Today you received the following chemotherapy and/or immunotherapy agents Gemzar, Carboplatin     To help prevent nausea and vomiting after your treatment, we encourage you to take your nausea medication as directed.  BELOW ARE SYMPTOMS THAT SHOULD BE REPORTED IMMEDIATELY: *FEVER GREATER THAN 100.4 F (38 C) OR HIGHER *CHILLS OR SWEATING *NAUSEA AND VOMITING THAT IS NOT CONTROLLED WITH YOUR NAUSEA MEDICATION *UNUSUAL SHORTNESS OF BREATH *UNUSUAL BRUISING OR BLEEDING *URINARY PROBLEMS (pain or burning when urinating, or frequent urination) *BOWEL PROBLEMS (unusual diarrhea, constipation, pain near the anus) TENDERNESS IN MOUTH AND THROAT WITH OR WITHOUT PRESENCE OF ULCERS (sore throat, sores in mouth, or a toothache) UNUSUAL RASH, SWELLING OR PAIN  UNUSUAL VAGINAL DISCHARGE OR ITCHING   Items with * indicate a potential emergency and should be followed up as soon as possible or go to the Emergency Department if any problems should occur.  Please show the CHEMOTHERAPY ALERT CARD or IMMUNOTHERAPY ALERT CARD at  check-in to the Emergency Department and triage nurse.  Should you have questions after your visit or need to cancel or reschedule your appointment, please contact [facility] CANCER [place] AT [place]  [phone] and follow the prompts.  Office hours are 8:00 a.m. to 4:30 p.m. Monday - Friday. Please note that voicemails left after 4:00 p.m. may not be returned until the following business day.  We are closed weekends and major holidays. You have access to a nurse at all times for urgent questions. Please call the main number to the clinic [phone] and follow the prompts.  For any non-urgent questions, you may also contact your provider using MyChart. We now offer e-Visits for anyone 37 and older to request care online for non-urgent symptoms. For details visit [email or web address].   Also download the MyChart app! Go to the app store, search "MyChart", open the app, select , and log in with your MyChart username and password.  Masks are optional in the cancer centers. If you would like for your care team to wear a mask while they are taking care of you, please let them know. For doctor visits, patients may have with them one support person who is at least 63 years old. At this time, visitors are not allowed in the infusion area.

## 2022-07-06 MED ORDER — INSULIN PEN NEEDLE 31G X 8 MM MISC: 1.0000 | Freq: Every day | 1 refills | Status: DC

## 2022-07-06 MED ORDER — FLUTICASONE PROPIONATE 50 MCG/ACT NA SUSP: 2.0000 | Freq: Every day | NASAL | 0 refills | Status: AC | PRN

## 2022-07-06 NOTE — Telephone Encounter (Signed)
Requested Prescriptions  Pending Prescriptions Disp Refills  . fluticasone (FLONASE) 50 MCG/ACT nasal spray 16 g 0     Ear, Nose, and Throat: Nasal Preparations - Corticosteroids Passed - 07/03/2022  5:39 PM      Passed - Valid encounter within last 12 months    Recent Outpatient Visits          6 months ago Diabetes mellitus type 2 with complications [facility])   [facility] Steele Sizer, MD   7 months ago Chronic left shoulder pain   [facility] Teodora Medici, DO   9 months ago Diabetes mellitus type 2 with complications [facility])   [facility] Steele Sizer, MD   1 year ago Diabetes mellitus type 2 with complications [facility])   [facility] Steele Sizer, MD   1 year ago Uncontrolled type 2 diabetes mellitus with microalbuminuria [facility])   [facility] Steele Sizer, MD      Future Appointments            In 7 months [facility] [place]            . Insulin Pen Needle 31G X 8 MM MISC 100 each 1    Sig: 1 each by Does not apply route daily.     Endocrinology: Diabetes - Testing Supplies Passed - 07/03/2022  5:39 PM      Passed - Valid encounter within last 12 months    Recent Outpatient Visits          6 months ago Diabetes mellitus type 2 with complications [facility])   [facility] Steele Sizer, MD   7 months ago Chronic left shoulder pain   [facility] Teodora Medici, DO   9 months ago Diabetes mellitus type 2 with complications [facility])   [facility] Steele Sizer, MD   1 year ago Diabetes mellitus type 2 with complications [facility])   [facility] Steele Sizer, MD   1 year ago Uncontrolled type 2 diabetes mellitus with microalbuminuria [facility])   [facility] Steele Sizer, MD      Future Appointments            In 7 months [facility]

## 2022-07-14 ENCOUNTER — Telehealth: Payer: Self-pay | Admitting: *Deleted

## 2022-07-14 ENCOUNTER — Telehealth: Payer: Self-pay | Admitting: Oncology

## 2022-07-14 NOTE — Telephone Encounter (Signed)
pt LVM that she will not be able to make appt friday

## 2022-07-14 NOTE — Telephone Encounter (Signed)
Patient called to reschedule this weeks appointment with Dr. Janese Banks.

## 2022-07-14 NOTE — Telephone Encounter (Signed)
Patient stated that she could not make her treatment appointment this Friday 8/11.Appointments were moved to the following Friday, 8/18 per her request. Subsequent cycle was also pushed out by 1 week to keep them spaced 3 weeks apart.   Anderson Malta

## 2022-07-15 ENCOUNTER — Encounter: Payer: Self-pay | Admitting: Oncology

## 2022-07-17 ENCOUNTER — Inpatient Hospital Stay: Payer: Medicare Other | Admitting: Nurse Practitioner

## 2022-07-17 ENCOUNTER — Inpatient Hospital Stay: Payer: Medicare Other

## 2022-07-23 MED FILL — Dexamethasone Sodium Phosphate Inj 100 MG/10ML: INTRAMUSCULAR | Qty: 1 | Status: AC

## 2022-07-24 ENCOUNTER — Encounter: Payer: Self-pay | Admitting: Medical Oncology

## 2022-07-24 ENCOUNTER — Inpatient Hospital Stay: Payer: Medicare Other | Attending: Nurse Practitioner

## 2022-07-24 ENCOUNTER — Inpatient Hospital Stay (HOSPITAL_BASED_OUTPATIENT_CLINIC_OR_DEPARTMENT_OTHER): Payer: Medicare Other | Admitting: Medical Oncology

## 2022-07-24 ENCOUNTER — Inpatient Hospital Stay: Payer: Medicare Other

## 2022-07-24 VITALS — BP 141/64 | HR 81 | Temp 98.7°F | Resp 20 | Wt 167.2 lb

## 2022-07-24 DIAGNOSIS — Z90710 Acquired absence of both cervix and uterus: Secondary | ICD-10-CM

## 2022-07-24 DIAGNOSIS — T451X5A Adverse effect of antineoplastic and immunosuppressive drugs, initial encounter: Secondary | ICD-10-CM

## 2022-07-24 DIAGNOSIS — I10 Essential (primary) hypertension: Secondary | ICD-10-CM

## 2022-07-24 DIAGNOSIS — C221 Intrahepatic bile duct carcinoma: Secondary | ICD-10-CM | POA: Diagnosis present

## 2022-07-24 DIAGNOSIS — D6959 Other secondary thrombocytopenia: Secondary | ICD-10-CM

## 2022-07-24 DIAGNOSIS — Z95828 Presence of other vascular implants and grafts: Secondary | ICD-10-CM | POA: Diagnosis not present

## 2022-07-24 DIAGNOSIS — D6481 Anemia due to antineoplastic chemotherapy: Secondary | ICD-10-CM | POA: Insufficient documentation

## 2022-07-24 DIAGNOSIS — F17210 Nicotine dependence, cigarettes, uncomplicated: Secondary | ICD-10-CM

## 2022-07-24 DIAGNOSIS — Z5111 Encounter for antineoplastic chemotherapy: Secondary | ICD-10-CM | POA: Insufficient documentation

## 2022-07-24 DIAGNOSIS — E119 Type 2 diabetes mellitus without complications: Secondary | ICD-10-CM

## 2022-07-24 LAB — CBC WITH DIFFERENTIAL/PLATELET
Abs Immature Granulocytes: 0.01 10*3/uL (ref 0.00–0.07)
Basophils Absolute: 0.1 10*3/uL (ref 0.0–0.1)
Basophils Relative: 1 %
Eosinophils Absolute: 0.1 10*3/uL (ref 0.0–0.5)
Eosinophils Relative: 2 %
HCT: 33.1 % — ABNORMAL LOW (ref 36.0–46.0)
Hemoglobin: 10.6 g/dL — ABNORMAL LOW (ref 12.0–15.0)
Immature Granulocytes: 0 %
Lymphocytes Relative: 25 %
Lymphs Abs: 1.7 10*3/uL (ref 0.7–4.0)
MCH: 33.1 pg (ref 26.0–34.0)
MCHC: 32 g/dL (ref 30.0–36.0)
MCV: 103.4 fL — ABNORMAL HIGH (ref 80.0–100.0)
Monocytes Absolute: 1 10*3/uL (ref 0.1–1.0)
Monocytes Relative: 15 %
Neutro Abs: 3.8 10*3/uL (ref 1.7–7.7)
Neutrophils Relative %: 57 %
Platelets: 206 10*3/uL (ref 150–400)
RBC: 3.2 MIL/uL — ABNORMAL LOW (ref 3.87–5.11)
RDW: 19.4 % — ABNORMAL HIGH (ref 11.5–15.5)
WBC: 6.6 10*3/uL (ref 4.0–10.5)
nRBC: 0 % (ref 0.0–0.2)

## 2022-07-24 LAB — COMPREHENSIVE METABOLIC PANEL
ALT: 32 U/L (ref 0–44)
AST: 48 U/L — ABNORMAL HIGH (ref 15–41)
Albumin: 2.8 g/dL — ABNORMAL LOW (ref 3.5–5.0)
Alkaline Phosphatase: 289 U/L — ABNORMAL HIGH (ref 38–126)
Anion gap: 3 — ABNORMAL LOW (ref 5–15)
BUN: 17 mg/dL (ref 8–23)
CO2: 21 mmol/L — ABNORMAL LOW (ref 22–32)
Calcium: 8 mg/dL — ABNORMAL LOW (ref 8.9–10.3)
Chloride: 113 mmol/L — ABNORMAL HIGH (ref 98–111)
Creatinine, Ser: 1.19 mg/dL — ABNORMAL HIGH (ref 0.44–1.00)
GFR, Estimated: 51 mL/min — ABNORMAL LOW (ref >60)
Glucose, Bld: 164 mg/dL — ABNORMAL HIGH (ref 70–99)
Potassium: 4.8 mmol/L (ref 3.5–5.1)
Sodium: 137 mmol/L (ref 135–145)
Total Bilirubin: 0.5 mg/dL (ref 0.3–1.2)
Total Protein: 7.6 g/dL (ref 6.5–8.1)

## 2022-07-24 MED ORDER — HEPARIN SOD (PORK) LOCK FLUSH 100 UNIT/ML IV SOLN
500.0000 [IU] | Freq: Once | INTRAVENOUS | Status: AC | PRN
  Administered 2022-07-24: 500 [IU]
  Filled 2022-07-24: qty 5

## 2022-07-24 MED ORDER — SODIUM CHLORIDE 0.9 % IV SOLN
1400.0000 mg | Freq: Once | INTRAVENOUS | Status: AC
  Administered 2022-07-24: 1400 mg via INTRAVENOUS
  Filled 2022-07-24: qty 5.26

## 2022-07-24 MED ORDER — SODIUM CHLORIDE 0.9 % IV SOLN
Freq: Once | INTRAVENOUS | Status: AC
  Filled 2022-07-24: qty 250

## 2022-07-24 MED ORDER — PALONOSETRON HCL INJECTION 0.25 MG/5ML
INTRAVENOUS | Status: AC
  Administered 2022-07-24: 0.25 mg via INTRAVENOUS
  Filled 2022-07-24: qty 5

## 2022-07-24 MED ORDER — FAMOTIDINE IN NACL 20-0.9 MG/50ML-% IV SOLN
20.0000 mg | Freq: Once | INTRAVENOUS | Status: AC
  Administered 2022-07-24: 20 mg via INTRAVENOUS
  Filled 2022-07-24: qty 50

## 2022-07-24 MED ORDER — DIPHENHYDRAMINE HCL 50 MG/ML IJ SOLN
50.0000 mg | Freq: Once | INTRAMUSCULAR | Status: AC
  Administered 2022-07-24: 50 mg via INTRAVENOUS
  Filled 2022-07-24: qty 1

## 2022-07-24 MED ORDER — PALONOSETRON HCL INJECTION 0.25 MG/5ML: 0.2500 mg | Freq: Once | INTRAVENOUS | Status: AC

## 2022-07-24 MED ORDER — SODIUM CHLORIDE 0.9 % IV SOLN
126.0000 mg | Freq: Once | INTRAVENOUS | Status: AC
  Administered 2022-07-24: 130 mg via INTRAVENOUS
  Filled 2022-07-24: qty 13

## 2022-07-24 MED ORDER — SODIUM CHLORIDE 0.9 % IV SOLN
10.0000 mg | Freq: Once | INTRAVENOUS | Status: AC
  Administered 2022-07-24: 10 mg via INTRAVENOUS
  Filled 2022-07-24: qty 10

## 2022-07-24 NOTE — Patient Instructions (Signed)
[facility] AT [place]  Discharge Instructions: Thank you for choosing [facility] to provide your oncology and hematology care.  If you have a lab appointment with the [facility], please go directly to the [facility] and check in at the registration area.  Wear comfortable clothing and clothing appropriate for easy access to any Portacath or PICC line.   We strive to give you quality time with your provider. You may need to reschedule your appointment if you arrive late (15 or more minutes).  Arriving late affects you and other patients whose appointments are after yours.  Also, if you miss three or more appointments without notifying the office, you may be dismissed from the clinic at the provider's discretion.      For prescription refill requests, have your pharmacy contact our office and allow 72 hours for refills to be completed.    Today you received the following chemotherapy and/or immunotherapy agents CARBOPLATIN and GEMZAR      To help prevent nausea and vomiting after your treatment, we encourage you to take your nausea medication as directed.  BELOW ARE SYMPTOMS THAT SHOULD BE REPORTED IMMEDIATELY: *FEVER GREATER THAN 100.4 F (38 C) OR HIGHER *CHILLS OR SWEATING *NAUSEA AND VOMITING THAT IS NOT CONTROLLED WITH YOUR NAUSEA MEDICATION *UNUSUAL SHORTNESS OF BREATH *UNUSUAL BRUISING OR BLEEDING *URINARY PROBLEMS (pain or burning when urinating, or frequent urination) *BOWEL PROBLEMS (unusual diarrhea, constipation, pain near the anus) TENDERNESS IN MOUTH AND THROAT WITH OR WITHOUT PRESENCE OF ULCERS (sore throat, sores in mouth, or a toothache) UNUSUAL RASH, SWELLING OR PAIN  UNUSUAL VAGINAL DISCHARGE OR ITCHING   Items with * indicate a potential emergency and should be followed up as soon as possible or go to the Emergency Department if any problems should occur.  Please show the CHEMOTHERAPY ALERT CARD or IMMUNOTHERAPY ALERT CARD at  check-in to the Emergency Department and triage nurse.  Should you have questions after your visit or need to cancel or reschedule your appointment, please contact [facility] [place] [facility] [place]  [phone] and follow the prompts.  Office hours are 8:00 a.m. to 4:30 p.m. Monday - Friday. Please note that voicemails left after 4:00 p.m. may not be returned until the following business day.  We are closed weekends and major holidays. You have access to a nurse at all times for urgent questions. Please call the main number to the clinic [phone] and follow the prompts.  For any non-urgent questions, you may also contact your provider using MyChart. We now offer e-Visits for anyone 61 and older to request care online for non-urgent symptoms. For details visit [email or web address].   Also download the MyChart app! Go to the app store, search "MyChart", open the app, select [facility], and log in with your MyChart username and password.  Masks are optional in the cancer centers. If you would like for your care team to wear a mask while they are taking care of you, please let them know. For doctor visits, patients may have with them one support person who is at least 63 years old. At this time, visitors are not allowed in the infusion area.   Carboplatin Injection What is this medication? CARBOPLATIN (KAR boe pla tin) treats some types of cancer. It works by slowing down the growth of cancer cells. This medicine may be used for other purposes; ask your health care provider or pharmacist if you have questions. COMMON BRAND NAME(S): Paraplatin What should I tell  my care team before I take this medication? They need to know if you have any of these conditions: Blood disorders Hearing problems Kidney disease Recent or ongoing radiation therapy An unusual or allergic reaction to carboplatin, cisplatin, other medications, foods, dyes, or preservatives Pregnant or trying to get  pregnant Breast-feeding How should I use this medication? This medication is injected into a vein. It is given by your care team in a hospital or clinic setting. Talk to your care team about the use of this medication in children. Special care may be needed. Overdosage: If you think you have taken too much of this medicine contact a poison control center or emergency room at once. NOTE: This medicine is only for you. Do not share this medicine with others. What if I miss a dose? Keep appointments for follow-up doses. It is important not to miss your dose. Call your care team if you are unable to keep an appointment. What may interact with this medication? Medications for seizures Some antibiotics, such as amikacin, gentamicin, neomycin, streptomycin, tobramycin Vaccines This list may not describe all possible interactions. Give your health care provider a list of all the medicines, herbs, non-prescription drugs, or dietary supplements you use. Also tell them if you smoke, drink alcohol, or use illegal drugs. Some items may interact with your medicine. What should I watch for while using this medication? Your condition will be monitored carefully while you are receiving this medication. You may need blood work while taking this medication. This medication may make you feel generally unwell. This is not uncommon, as chemotherapy can affect healthy cells as well as cancer cells. Report any side effects. Continue your course of treatment even though you feel ill unless your care team tells you to stop. In some cases, you may be given additional medications to help with side effects. Follow all directions for their use. This medication may increase your risk of getting an infection. Call your care team for advice if you get a fever, chills, sore throat, or other symptoms of a cold or flu. Do not treat yourself. Try to avoid being around people who are sick. Avoid taking medications that contain  aspirin, acetaminophen, ibuprofen, naproxen, or ketoprofen unless instructed by your care team. These medications may hide a fever. Be careful brushing or flossing your teeth or using a toothpick because you may get an infection or bleed more easily. If you have any dental work done, tell your dentist you are receiving this medication. Talk to your care team if you wish to become pregnant or think you might be pregnant. This medication can cause serious birth defects. Talk to your care team about effective forms of contraception. Do not breast-feed while taking this medication. What side effects may I notice from receiving this medication? Side effects that you should report to your care team as soon as possible: Allergic reactions--skin rash, itching, hives, swelling of the face, lips, tongue, or throat Infection--fever, chills, cough, sore throat, wounds that don't heal, pain or trouble when passing urine, general feeling of discomfort or being unwell Low red blood cell level--unusual weakness or fatigue, dizziness, headache, trouble breathing Pain, tingling, or numbness in the hands or feet, muscle weakness, change in vision, confusion or trouble speaking, loss of balance or coordination, trouble walking, seizures Unusual bruising or bleeding Side effects that usually do not require medical attention (report to your care team if they continue or are bothersome): Hair loss Nausea Unusual weakness or fatigue Vomiting This list  may not describe all possible side effects. Call your doctor for medical advice about side effects. You may report side effects to FDA at 1-800-FDA-1088. Where should I keep my medication? This medication is given in a hospital or clinic. It will not be stored at home. NOTE: This sheet is a summary. It may not cover all possible information. If you have questions about this medicine, talk to your doctor, pharmacist, or health care provider.  2023 Elsevier/Gold Standard  (2022-03-17 00:00:00)  Gemcitabine Injection What is this medication? GEMCITABINE (jem SYE ta been) treats some types of cancer. It works by slowing down the growth of cancer cells. This medicine may be used for other purposes; ask your health care provider or pharmacist if you have questions. COMMON BRAND NAME(S): Gemzar, Infugem What should I tell my care team before I take this medication? They need to know if you have any of these conditions: Blood disorders Infection Kidney disease Liver disease Lung or breathing disease, such as asthma or COPD Recent or ongoing radiation therapy An unusual or allergic reaction to gemcitabine, other medications, foods, dyes, or preservatives If you or your partner are pregnant or trying to get pregnant Breast-feeding How should I use this medication? This medication is injected into a vein. It is given by your care team in a hospital or clinic setting. Talk to your care team about the use of this medication in children. Special care may be needed. Overdosage: If you think you have taken too much of this medicine contact a poison control center or emergency room at once. NOTE: This medicine is only for you. Do not share this medicine with others. What if I miss a dose? Keep appointments for follow-up doses. It is important not to miss your dose. Call your care team if you are unable to keep an appointment. What may interact with this medication? Interactions have not been studied. This list may not describe all possible interactions. Give your health care provider a list of all the medicines, herbs, non-prescription drugs, or dietary supplements you use. Also tell them if you smoke, drink alcohol, or use illegal drugs. Some items may interact with your medicine. What should I watch for while using this medication? Your condition will be monitored carefully while you are receiving this medication. This medication may make you feel generally unwell.  This is not uncommon, as chemotherapy can affect healthy cells as well as cancer cells. Report any side effects. Continue your course of treatment even though you feel ill unless your care team tells you to stop. In some cases, you may be given additional medications to help with side effects. Follow all directions for their use. This medication may increase your risk of getting an infection. Call your care team for advice if you get a fever, chills, sore throat, or other symptoms of a cold or flu. Do not treat yourself. Try to avoid being around people who are sick. This medication may increase your risk to bruise or bleed. Call your care team if you notice any unusual bleeding. Be careful brushing or flossing your teeth or using a toothpick because you may get an infection or bleed more easily. If you have any dental work done, tell your dentist you are receiving this medication. Avoid taking medications that contain aspirin, acetaminophen, ibuprofen, naproxen, or ketoprofen unless instructed by your care team. These medications may hide a fever. Talk to your care team if you or your partner wish to become pregnant or think you  might be pregnant. This medication can cause serious birth defects if taken during pregnancy and for 6 months after the last dose. A negative pregnancy test is required before starting this medication. A reliable form of contraception is recommended while taking this medication and for 6 months after the last dose. Talk to your care team about effective forms of contraception. Do not father a child while taking this medication and for 3 months after the last dose. Use a condom while having sex during this time period. Do not breastfeed while taking this medication and for at least 1 week after the last dose. This medication may cause infertility. Talk to your care team if you are concerned about your fertility. What side effects may I notice from receiving this medication? Side  effects that you should report to your care team as soon as possible: Allergic reactions--skin rash, itching, hives, swelling of the face, lips, tongue, or throat Capillary leak syndrome--stomach or muscle pain, unusual weakness or fatigue, feeling faint or lightheaded, decrease in the amount of urine, swelling of the ankles, hands, or feet, trouble breathing Infection--fever, chills, cough, sore throat, wounds that don't heal, pain or trouble when passing urine, general feeling of discomfort or being unwell Liver injury--right upper belly pain, loss of appetite, nausea, light-colored stool, dark yellow or brown urine, yellowing skin or eyes, unusual weakness or fatigue Low red blood cell level--unusual weakness or fatigue, dizziness, headache, trouble breathing Lung injury--shortness of breath or trouble breathing, cough, spitting up blood, chest pain, fever Stomach pain, bloody diarrhea, pale skin, unusual weakness or fatigue, decrease in the amount of urine, which may be signs of hemolytic uremic syndrome Sudden and severe headache, confusion, change in vision, seizures, which may be signs of posterior reversible encephalopathy syndrome (PRES) Unusual bruising or bleeding Side effects that usually do not require medical attention (report to your care team if they continue or are bothersome): Diarrhea Drowsiness Hair loss Nausea Pain, redness, or swelling with sores inside the mouth or throat Vomiting This list may not describe all possible side effects. Call your doctor for medical advice about side effects. You may report side effects to FDA at 1-800-FDA-1088. Where should I keep my medication? This medication is given in a hospital or clinic. It will not be stored at home. NOTE: This sheet is a summary. It may not cover all possible information. If you have questions about this medicine, talk to your doctor, pharmacist, or health care provider.  2023 Elsevier/Gold Standard (2022-03-25  00:00:00)

## 2022-07-24 NOTE — Progress Notes (Signed)
Hematology/Oncology Consult note [facility]  [phone] [phone]  Patient Care Team: Steele Sizer, MD as PCP - General (Family Medicine) Clent Jacks, RN as Oncology Nurse Navigator Germaine Pomfret, [facility] (Pharmacist) Sindy Guadeloupe, MD as Consulting Physician (Oncology) Borders, Kirt Boys, NP as Nurse Practitioner [facility] and Palliative [facility])   Name of the patient: Janice Short  [id number]  Aug 22, 1961   Date of visit: 07/24/22  Diagnosis- metastatic intrahepatic cholangiocarcinoma  Chief complaint/ Reason for visit-on treatment assessment prior to cycle 15-day 1 of carboplatin gemcitabine chemotherapy  Heme/Onc history: Patient is a 62 year old African-American female with a past medical history significant for hypertension hyperlipidemia and type 2 diabetes who presented to the ER with symptoms of cramping abdominal pain and nausea.  This was followed by right upper quadrant ultrasound which showed multiple echogenic masses in the liver with nodular contour of the liver possibly suggestive of cirrhosis.  Several nodular masses in the vicinity of the pancreatic head and porta hepatic lymph nodes.  This was followed by an MRI abdomen and MRCP.  It showed right hepatic lobe masslike area measuring 4.9 x 2.9 cm.  Diffusion abnormality in the left hepatic lobe with mild left-sided biliary ductal dilatation and another area of abnormality measuring 1.4 x 1 cm.  Bulky celiac adenopathy 3 x 2.1 cm tracking into gastrohepatic recess.  Suspected portal venous invasion with convex protrusion in the right portal vein.  Left adrenal lesion measuring 2.9 x 1.5 cm.  Findings concerning for cholangiocarcinoma or biphenotypic cholangiole hepatocellular neoplasm.     Patient underwent CT-guided liver biopsy Which was consistent with adenocarcinoma poorly differentiated.  Immunohistochemistry showed CK7 positivity, CDX2 negative, Heppar negative,  GATA3 negative.  CK19 positive.  Differentials include cholangiocarcinoma, pancreatic, upper GI.  Lung and breast less likely.  However given CK19 positivity most compatible with cholangiocarcinoma.   Patient's case was discussed at tumor board and it was felt as if the lymphadenopathy was more like a confluent primary tumor mass.  Patient was referred to [facility] for second opinion to see if she would be a candidate for surgery.However upon their review of the PET scan it was deemed that this was indeed periportal adenopathy.  They also did a CT chest abdomen and pelvis with MIPS protocol and she was also found to have portocaval lymph node measuring 2.4 cm which was more consistent with metastatic disease.  She was deemed unresectable   NGS testing showed BRCA2, FGFR2, PIK3CA, RAD 54L, CDC73, CDKN2 A/B CR EBBP rearrangement exon 31    Patient has been getting gemcitabine cisplatin chemotherapy 2 weeks on 1 week off since August 2022.  Patient had an allergic reaction to cisplatin and therefore was switched to carboplatin gemcitabine chemotherapy which she gets week on week off    Interval history- Patient states that she is doing really well. No side effects from her treatment. She is eating and drinking well- has gained a few pounds thanks to this. No night sweats, fevers, N/V/D/C. No new pain.   ECOG PS- 1 Pain scale- 0   Review of systems- Review of Systems  Constitutional:  Negative for chills, fever, malaise/fatigue and weight loss.  HENT:  Negative for congestion, ear discharge and nosebleeds.   Eyes:  Negative for blurred vision.  Respiratory:  Negative for cough, hemoptysis, sputum production, shortness of breath and wheezing.   Cardiovascular:  Negative for chest pain, palpitations, orthopnea and claudication.  Gastrointestinal:  Negative for abdominal pain, blood  in stool, constipation, diarrhea, heartburn, melena, nausea and vomiting.  Genitourinary:  Negative for dysuria, flank pain,  frequency, hematuria and urgency.  Musculoskeletal:  Negative for back pain, joint pain and myalgias.  Skin:  Negative for rash.  Neurological:  Negative for dizziness, tingling, focal weakness, seizures, weakness and headaches.  Endo/Heme/Allergies:  Does not bruise/bleed easily.  Psychiatric/Behavioral:  Negative for depression and suicidal ideas. The patient does not have insomnia.       Allergies  Allergen Reactions   Other Shortness Of Breath    Cat dander and grass pollen   Cisplatin Itching   Bydureon [Exenatide] Other (See Comments)    Pain with injection not allergy     Past Medical History:  Diagnosis Date   Allergy    Asthma    Carpal tunnel syndrome on left    Cervical radiculitis    Chronic osteoarthritis    Corns and callosity    Depression    Dermatophytosis of foot    Diabetes mellitus without complication (HCC)    Gout    Hyperlipidemia    Hypertension    Impingement syndrome of left shoulder    Intrahepatic cholangiocarcinoma (HCC)    Lumbosacral neuritis    Microalbuminuria    Obesity    Supraventricular tachycardia ([place]) 07/01/2015   SVT (supraventricular tachycardia) (HCC)    Tenosynovitis of wrist    Vitamin D deficiency      Past Surgical History:  Procedure Laterality Date   ABDOMINAL HYSTERECTOMY     COLONOSCOPY WITH PROPOFOL N/A 05/06/2021   Procedure: COLONOSCOPY WITH PROPOFOL;  Surgeon: Lucilla Lame, MD;  Location: [facility] ENDOSCOPY;  Service: Endoscopy;  Laterality: N/A;   ESOPHAGOGASTRODUODENOSCOPY (EGD) WITH PROPOFOL N/A 05/06/2021   Procedure: ESOPHAGOGASTRODUODENOSCOPY (EGD) WITH PROPOFOL;  Surgeon: Lucilla Lame, MD;  Location: [facility] ENDOSCOPY;  Service: Endoscopy;  Laterality: N/A;   PORTA CATH INSERTION N/A 07/17/2021   Procedure: PORTA CATH INSERTION;  Surgeon: Algernon Huxley, MD;  Location: [place] [facility];  Service: Cardiovascular;  Laterality: N/A;    Social History   Socioeconomic History   Marital status: Single    Spouse  name: Not on file   Number of children: 2   Years of education: Not on file   Highest education level: Not on file  Occupational History   Occupation: disable     Comment: from chronic back pain, 2019  Tobacco Use   Smoking status: Some Days    Types: Cigarettes   Smokeless tobacco: Never   Tobacco comments:    1 per day 01/30/21 does not inhale it  Vaping Use   Vaping Use: Never used  Substance and Sexual Activity   Alcohol use: Not Currently   Drug use: No   Sexual activity: Not Currently  Other Topics Concern   Not on file  Social History Narrative   She is on disability for chronic back pain. Pt's son lives with her   Social Determinants of Health   Financial Resource Strain: Medium Risk (02/03/2022)   Overall Financial Resource Strain (CARDIA)    Difficulty of Paying Living Expenses: Somewhat hard  Food Insecurity: Food Insecurity Present (02/03/2022)   Hunger Vital Sign    Worried About Running Out of Food in the Last Year: Sometimes true    Ran Out of Food in the Last Year: Never true  Transportation Needs: No Transportation Needs (04/24/2022)   PRAPARE - Hydrologist (Medical): No    Lack of Transportation (Non-Medical):  No  Physical Activity: Inactive (02/03/2022)   Exercise Vital Sign    Days of Exercise per Week: 0 days    Minutes of Exercise per Session: 0 min  Stress: No Stress Concern Present (02/03/2022)   [place]    Feeling of Stress : Only a little  Social Connections: Moderately Isolated (02/03/2022)   Social Connection and Isolation Panel [NHANES]    Frequency of Communication with Friends and Family: More than three times a week    Frequency of Social Gatherings with Friends and Family: Three times a week    Attends Religious Services: More than 4 times per year    Active Member of Clubs or Organizations: No    Attends Archivist Meetings: Never     Marital Status: Never married  Intimate Partner Violence: Not At Risk (02/03/2022)   Humiliation, Afraid, Rape, and Kick questionnaire    Fear of Current or Ex-Partner: No    Emotionally Abused: No    Physically Abused: No    Sexually Abused: No    Family History  Problem Relation Age of Onset   Heart attack Mother    CAD Mother    Kidney disease Father      Current Outpatient Medications:    albuterol (VENTOLIN HFA) 108 (90 Base) MCG/ACT inhaler, INHALE 2 PUFFS BY MOUTH EVERY 6 HOURS AS NEEDED FOR WHEEZING AND FOR SHORTNESS OF BREATH, Disp: 18 g, Rfl: 2   aspirin 81 MG tablet, Take 1 tablet by mouth daily., Disp: , Rfl:    atorvastatin (LIPITOR) 40 MG tablet, Take 1 tablet (40 mg total) by mouth at bedtime., Disp: 90 tablet, Rfl: 1   blood glucose meter kit and supplies, Dispense based on patient and insurance preference. Use up to four times daily as directed. (FOR ICD-10 E10.9, E11.9)., Disp: 1 each, Rfl: 0   diphenhydramine-acetaminophen (TYLENOL PM) 25-500 MG TABS tablet, Take 1 tablet by mouth at bedtime as needed., Disp: , Rfl:    escitalopram (LEXAPRO) 20 MG tablet, TAKE ONE TABLET BY MOUTH DAILY AT 9AM, Disp: 90 tablet, Rfl: 1   fluticasone (FLONASE) 50 MCG/ACT nasal spray, Place 2 sprays into both nostrils daily as needed. USE 2 SPRAY(S) IN EACH NOSTRIL AT BEDTIME, Disp: 16 g, Rfl: 0   Fluticasone-Umeclidin-Vilant (TRELEGY ELLIPTA) 100-62.5-25 MCG/INH AEPB, Take 1 puff by mouth daily., Disp: 180 each, Rfl: 1   gabapentin (NEURONTIN) 300 MG capsule, TAKE TWO CAPSULES BY MOUTH TWICE DAILY @ 9AM & 5PM, Disp: 360 capsule, Rfl: 1   insulin degludec (TRESIBA FLEXTOUCH) 100 UNIT/ML FlexTouch Pen, INJECT 12-30 UNITS OF INSULIN INTO THE SKIN DAILY TO KEEP FASTING GLUCOSE BETWEEN 100-140, Disp: 15 mL, Rfl: 1   Insulin Pen Needle 31G X 8 MM MISC, 1 each by Does not apply route daily., Disp: 100 each, Rfl: 1   lidocaine-prilocaine (EMLA) cream, Apply 1 application. topically as needed.  Apply to port and cover with saran wrap 1-2 hours prior to port access, Disp: 30 g, Rfl: 3   folic acid (FOLVITE) 1 MG tablet, Take 1 tablet (1 mg total) by mouth daily. (Patient not taking: Reported on 02/13/2022), Disp: 30 tablet, Rfl: 3   ondansetron (ZOFRAN) 4 MG tablet, Take 1 tablet (4 mg total) by mouth every 8 (eight) hours as needed. (Patient not taking: Reported on 06/19/2022), Disp: 20 tablet, Rfl: 0 No current facility-administered medications for this visit.  Facility-Administered Medications Ordered in Other Visits:    heparin  lock flush 100 UNIT/ML injection, , , ,   Physical exam:  Vitals:   07/24/22 0852  BP: (!) 141/64  Pulse: 81  Resp: 20  Temp: 98.7 F (37.1 C)  SpO2: 99%  Weight: 167 lb 3.2 oz (75.8 kg)   Physical Exam Constitutional:      General: She is not in acute distress. Cardiovascular:     Rate and Rhythm: Normal rate and regular rhythm.     Heart sounds: Normal heart sounds.  Pulmonary:     Effort: Pulmonary effort is normal.     Breath sounds: Normal breath sounds.  Abdominal:     General: Bowel sounds are normal.     Palpations: Abdomen is soft.  Skin:    General: Skin is warm and dry.  Neurological:     Mental Status: She is alert and oriented to person, place, and time.         Latest Ref Rng & Units 07/24/2022    8:35 AM  CMP  Glucose 70 - 99 mg/dL 164   BUN 8 - 23 mg/dL 17   Creatinine 0.44 - 1.00 mg/dL 1.19   Sodium 135 - 145 mmol/L 137   Potassium 3.5 - 5.1 mmol/L 4.8   Chloride 98 - 111 mmol/L 113   CO2 22 - 32 mmol/L 21   Calcium 8.9 - 10.3 mg/dL 8.0   Total Protein 6.5 - 8.1 g/dL 7.6   Total Bilirubin 0.3 - 1.2 mg/dL 0.5   Alkaline Phos 38 - 126 U/L 289   AST 15 - 41 U/L 48   ALT 0 - 44 U/L 32       Latest Ref Rng & Units 07/24/2022    8:35 AM  CBC  WBC 4.0 - 10.5 K/uL 6.6   Hemoglobin 12.0 - 15.0 g/dL 10.6   Hematocrit 36.0 - 46.0 % 33.1   Platelets 150 - 400 K/uL 206      Assessment and plan- Patient is a 61  y.o. female  with stage IV intrahepatic cholangiocarcinoma.  She is here for on treatment assessment prior to cycle 14-day 1 of carboplatin gemcitabine chemotherapy  Reviewed her labs. Counts are acceptable for her to proceed forward with cycle 15 day 1 of carboplatin gemcitabine today. She does not need Udenyca with this cycle. She will RTC in 2 weeks with MD, labs/scans prior per Dr. Elroy Channel last note.     Visit Diagnosis 1. Intrahepatic cholangiocarcinoma ([place])   2. Encounter for antineoplastic chemotherapy   3. Antineoplastic chemotherapy induced anemia   4. Chemotherapy-induced thrombocytopenia   5. Port-A-Cath in place       Minna Antis [facility] 07/24/2022 9:40 AM

## 2022-07-25 LAB — CANCER ANTIGEN 19-9: CA 19-9: <2 U/mL (ref 0–35)

## 2022-07-31 ENCOUNTER — Ambulatory Visit: Payer: Medicare Other | Admitting: Oncology

## 2022-07-31 ENCOUNTER — Ambulatory Visit: Payer: Medicare Other

## 2022-07-31 ENCOUNTER — Other Ambulatory Visit: Payer: Medicare Other

## 2022-08-07 ENCOUNTER — Inpatient Hospital Stay (HOSPITAL_BASED_OUTPATIENT_CLINIC_OR_DEPARTMENT_OTHER): Payer: Medicare Other | Admitting: Oncology

## 2022-08-07 ENCOUNTER — Inpatient Hospital Stay: Payer: Medicare Other | Attending: Nurse Practitioner

## 2022-08-07 ENCOUNTER — Inpatient Hospital Stay: Payer: Medicare Other

## 2022-08-07 ENCOUNTER — Encounter: Payer: Self-pay | Admitting: Oncology

## 2022-08-07 VITALS — BP 146/77 | HR 75 | Temp 96.4°F | Resp 16 | Wt 163.8 lb

## 2022-08-07 DIAGNOSIS — T451X5A Adverse effect of antineoplastic and immunosuppressive drugs, initial encounter: Secondary | ICD-10-CM | POA: Diagnosis not present

## 2022-08-07 DIAGNOSIS — N179 Acute kidney failure, unspecified: Secondary | ICD-10-CM | POA: Diagnosis not present

## 2022-08-07 DIAGNOSIS — Z5111 Encounter for antineoplastic chemotherapy: Secondary | ICD-10-CM

## 2022-08-07 DIAGNOSIS — E119 Type 2 diabetes mellitus without complications: Secondary | ICD-10-CM

## 2022-08-07 DIAGNOSIS — Z90710 Acquired absence of both cervix and uterus: Secondary | ICD-10-CM | POA: Diagnosis not present

## 2022-08-07 DIAGNOSIS — F17210 Nicotine dependence, cigarettes, uncomplicated: Secondary | ICD-10-CM

## 2022-08-07 DIAGNOSIS — I10 Essential (primary) hypertension: Secondary | ICD-10-CM | POA: Insufficient documentation

## 2022-08-07 DIAGNOSIS — D6959 Other secondary thrombocytopenia: Secondary | ICD-10-CM | POA: Diagnosis not present

## 2022-08-07 DIAGNOSIS — C221 Intrahepatic bile duct carcinoma: Secondary | ICD-10-CM

## 2022-08-07 LAB — CBC WITH DIFFERENTIAL/PLATELET
Abs Immature Granulocytes: 0.02 10*3/uL (ref 0.00–0.07)
Basophils Absolute: 0 10*3/uL (ref 0.0–0.1)
Basophils Relative: 0 %
Eosinophils Absolute: 0.2 10*3/uL (ref 0.0–0.5)
Eosinophils Relative: 2 %
HCT: 31.9 % — ABNORMAL LOW (ref 36.0–46.0)
Hemoglobin: 10.4 g/dL — ABNORMAL LOW (ref 12.0–15.0)
Immature Granulocytes: 0 %
Lymphocytes Relative: 22 %
Lymphs Abs: 1.7 10*3/uL (ref 0.7–4.0)
MCH: 32.9 pg (ref 26.0–34.0)
MCHC: 32.6 g/dL (ref 30.0–36.0)
MCV: 100.9 fL — ABNORMAL HIGH (ref 80.0–100.0)
Monocytes Absolute: 0.7 10*3/uL (ref 0.1–1.0)
Monocytes Relative: 9 %
Neutro Abs: 5.1 10*3/uL (ref 1.7–7.7)
Neutrophils Relative %: 67 %
Platelets: 91 10*3/uL — ABNORMAL LOW (ref 150–400)
RBC: 3.16 MIL/uL — ABNORMAL LOW (ref 3.87–5.11)
RDW: 21 % — ABNORMAL HIGH (ref 11.5–15.5)
WBC: 7.7 10*3/uL (ref 4.0–10.5)
nRBC: 0 % (ref 0.0–0.2)

## 2022-08-07 LAB — COMPREHENSIVE METABOLIC PANEL
ALT: 25 U/L (ref 0–44)
AST: 33 U/L (ref 15–41)
Albumin: 2.7 g/dL — ABNORMAL LOW (ref 3.5–5.0)
Alkaline Phosphatase: 320 U/L — ABNORMAL HIGH (ref 38–126)
Anion gap: 6 (ref 5–15)
BUN: 12 mg/dL (ref 8–23)
CO2: 22 mmol/L (ref 22–32)
Calcium: 8 mg/dL — ABNORMAL LOW (ref 8.9–10.3)
Chloride: 110 mmol/L (ref 98–111)
Creatinine, Ser: 1.11 mg/dL — ABNORMAL HIGH (ref 0.44–1.00)
GFR, Estimated: 56 mL/min — ABNORMAL LOW (ref >60)
Glucose, Bld: 152 mg/dL — ABNORMAL HIGH (ref 70–99)
Potassium: 4.5 mmol/L (ref 3.5–5.1)
Sodium: 138 mmol/L (ref 135–145)
Total Bilirubin: 1.1 mg/dL (ref 0.3–1.2)
Total Protein: 7.8 g/dL (ref 6.5–8.1)

## 2022-08-07 MED ORDER — SODIUM CHLORIDE 0.9 % IV SOLN
Freq: Once | INTRAVENOUS | Status: AC
  Filled 2022-08-07: qty 250

## 2022-08-07 MED ORDER — DIPHENHYDRAMINE HCL 50 MG/ML IJ SOLN
25.0000 mg | Freq: Once | INTRAMUSCULAR | Status: AC
  Administered 2022-08-07: 25 mg via INTRAVENOUS
  Filled 2022-08-07: qty 1

## 2022-08-07 MED ORDER — FAMOTIDINE IN NACL 20-0.9 MG/50ML-% IV SOLN
20.0000 mg | Freq: Once | INTRAVENOUS | Status: AC
  Administered 2022-08-07: 20 mg via INTRAVENOUS
  Filled 2022-08-07: qty 50

## 2022-08-07 MED ORDER — SODIUM CHLORIDE 0.9 % IV SOLN
1400.0000 mg | Freq: Once | INTRAVENOUS | Status: AC
  Administered 2022-08-07: 1400 mg via INTRAVENOUS
  Filled 2022-08-07: qty 26.3

## 2022-08-07 MED ORDER — SODIUM CHLORIDE 0.9 % IV SOLN
10.0000 mg | Freq: Once | INTRAVENOUS | Status: AC
  Administered 2022-08-07: 10 mg via INTRAVENOUS
  Filled 2022-08-07: qty 10

## 2022-08-07 MED ORDER — PALONOSETRON HCL INJECTION 0.25 MG/5ML
0.2500 mg | Freq: Once | INTRAVENOUS | Status: AC
  Administered 2022-08-07: 0.25 mg via INTRAVENOUS
  Filled 2022-08-07: qty 5

## 2022-08-07 MED ORDER — HEPARIN SOD (PORK) LOCK FLUSH 100 UNIT/ML IV SOLN
500.0000 [IU] | Freq: Once | INTRAVENOUS | Status: AC | PRN
  Administered 2022-08-07: 500 [IU]
  Filled 2022-08-07: qty 5

## 2022-08-07 MED ORDER — SODIUM CHLORIDE 0.9 % IV SOLN
128.7000 mg | Freq: Once | INTRAVENOUS | Status: AC
  Administered 2022-08-07: 130 mg via INTRAVENOUS
  Filled 2022-08-07: qty 13

## 2022-08-07 NOTE — Patient Instructions (Signed)
[facility] AT [place]  Discharge Instructions: Thank you for choosing [facility] to provide your oncology and hematology care.  If you have a lab appointment with the [facility], please go directly to the [facility] and check in at the registration area.  Wear comfortable clothing and clothing appropriate for easy access to any Portacath or PICC line.   We strive to give you quality time with your provider. You may need to reschedule your appointment if you arrive late (15 or more minutes).  Arriving late affects you and other patients whose appointments are after yours.  Also, if you miss three or more appointments without notifying the office, you may be dismissed from the clinic at the provider's discretion.      For prescription refill requests, have your pharmacy contact our office and allow 72 hours for refills to be completed.    Today you received the following chemotherapy and/or immunotherapy agents GEMZAR and CARBOPLATIN      To help prevent nausea and vomiting after your treatment, we encourage you to take your nausea medication as directed.  BELOW ARE SYMPTOMS THAT SHOULD BE REPORTED IMMEDIATELY: *FEVER GREATER THAN 100.4 F (38 C) OR HIGHER *CHILLS OR SWEATING *NAUSEA AND VOMITING THAT IS NOT CONTROLLED WITH YOUR NAUSEA MEDICATION *UNUSUAL SHORTNESS OF BREATH *UNUSUAL BRUISING OR BLEEDING *URINARY PROBLEMS (pain or burning when urinating, or frequent urination) *BOWEL PROBLEMS (unusual diarrhea, constipation, pain near the anus) TENDERNESS IN MOUTH AND THROAT WITH OR WITHOUT PRESENCE OF ULCERS (sore throat, sores in mouth, or a toothache) UNUSUAL RASH, SWELLING OR PAIN  UNUSUAL VAGINAL DISCHARGE OR ITCHING   Items with * indicate a potential emergency and should be followed up as soon as possible or go to the Emergency Department if any problems should occur.  Please show the CHEMOTHERAPY ALERT CARD or IMMUNOTHERAPY ALERT CARD at  check-in to the Emergency Department and triage nurse.  Should you have questions after your visit or need to cancel or reschedule your appointment, please contact [facility] [place] AT [place]  [phone] and follow the prompts.  Office hours are 8:00 a.m. to 4:30 p.m. Monday - Friday. Please note that voicemails left after 4:00 p.m. may not be returned until the following business day.  We are closed weekends and major holidays. You have access to a nurse at all times for urgent questions. Please call the main number to the clinic [phone] and follow the prompts.  For any non-urgent questions, you may also contact your provider using MyChart. We now offer e-Visits for anyone 59 and older to request care online for non-urgent symptoms. For details visit [email or web address].   Also download the MyChart app! Go to the app store, search "MyChart", open the app, select [facility], and log in with your MyChart username and password.  Masks are optional in the cancer centers. If you would like for your care team to wear a mask while they are taking care of you, please let them know. For doctor visits, patients may have with them one support person who is at least 63 years old. At this time, visitors are not allowed in the infusion area.  Gemcitabine Injection What is this medication? GEMCITABINE (jem SYE ta been) treats some types of cancer. It works by slowing down the growth of cancer cells. This medicine may be used for other purposes; ask your health care provider or pharmacist if you have questions. COMMON BRAND NAME(S): Gemzar, Infugem What should I tell  my care team before I take this medication? They need to know if you have any of these conditions: Blood disorders Infection Kidney disease Liver disease Lung or breathing disease, such as asthma or COPD Recent or ongoing radiation therapy An unusual or allergic reaction to gemcitabine, other medications, foods,  dyes, or preservatives If you or your partner are pregnant or trying to get pregnant Breast-feeding How should I use this medication? This medication is injected into a vein. It is given by your care team in a hospital or clinic setting. Talk to your care team about the use of this medication in children. Special care may be needed. Overdosage: If you think you have taken too much of this medicine contact a poison control center or emergency room at once. NOTE: This medicine is only for you. Do not share this medicine with others. What if I miss a dose? Keep appointments for follow-up doses. It is important not to miss your dose. Call your care team if you are unable to keep an appointment. What may interact with this medication? Interactions have not been studied. This list may not describe all possible interactions. Give your health care provider a list of all the medicines, herbs, non-prescription drugs, or dietary supplements you use. Also tell them if you smoke, drink alcohol, or use illegal drugs. Some items may interact with your medicine. What should I watch for while using this medication? Your condition will be monitored carefully while you are receiving this medication. This medication may make you feel generally unwell. This is not uncommon, as chemotherapy can affect healthy cells as well as cancer cells. Report any side effects. Continue your course of treatment even though you feel ill unless your care team tells you to stop. In some cases, you may be given additional medications to help with side effects. Follow all directions for their use. This medication may increase your risk of getting an infection. Call your care team for advice if you get a fever, chills, sore throat, or other symptoms of a cold or flu. Do not treat yourself. Try to avoid being around people who are sick. This medication may increase your risk to bruise or bleed. Call your care team if you notice any unusual  bleeding. Be careful brushing or flossing your teeth or using a toothpick because you may get an infection or bleed more easily. If you have any dental work done, tell your dentist you are receiving this medication. Avoid taking medications that contain aspirin, acetaminophen, ibuprofen, naproxen, or ketoprofen unless instructed by your care team. These medications may hide a fever. Talk to your care team if you or your partner wish to become pregnant or think you might be pregnant. This medication can cause serious birth defects if taken during pregnancy and for 6 months after the last dose. A negative pregnancy test is required before starting this medication. A reliable form of contraception is recommended while taking this medication and for 6 months after the last dose. Talk to your care team about effective forms of contraception. Do not father a child while taking this medication and for 3 months after the last dose. Use a condom while having sex during this time period. Do not breastfeed while taking this medication and for at least 1 week after the last dose. This medication may cause infertility. Talk to your care team if you are concerned about your fertility. What side effects may I notice from receiving this medication? Side effects that you should  report to your care team as soon as possible: Allergic reactions--skin rash, itching, hives, swelling of the face, lips, tongue, or throat Capillary leak syndrome--stomach or muscle pain, unusual weakness or fatigue, feeling faint or lightheaded, decrease in the amount of urine, swelling of the ankles, hands, or feet, trouble breathing Infection--fever, chills, cough, sore throat, wounds that don't heal, pain or trouble when passing urine, general feeling of discomfort or being unwell Liver injury--right upper belly pain, loss of appetite, nausea, light-colored stool, dark yellow or brown urine, yellowing skin or eyes, unusual weakness or  fatigue Low red blood cell level--unusual weakness or fatigue, dizziness, headache, trouble breathing Lung injury--shortness of breath or trouble breathing, cough, spitting up blood, chest pain, fever Stomach pain, bloody diarrhea, pale skin, unusual weakness or fatigue, decrease in the amount of urine, which may be signs of hemolytic uremic syndrome Sudden and severe headache, confusion, change in vision, seizures, which may be signs of posterior reversible encephalopathy syndrome (PRES) Unusual bruising or bleeding Side effects that usually do not require medical attention (report to your care team if they continue or are bothersome): Diarrhea Drowsiness Hair loss Nausea Pain, redness, or swelling with sores inside the mouth or throat Vomiting This list may not describe all possible side effects. Call your doctor for medical advice about side effects. You may report side effects to FDA at 1-800-FDA-1088. Where should I keep my medication? This medication is given in a hospital or clinic. It will not be stored at home. NOTE: This sheet is a summary. It may not cover all possible information. If you have questions about this medicine, talk to your doctor, pharmacist, or health care provider.  2023 Elsevier/Gold Standard (2022-03-25 00:00:00)  Carboplatin Injection What is this medication? CARBOPLATIN (KAR boe pla tin) treats some types of cancer. It works by slowing down the growth of cancer cells. This medicine may be used for other purposes; ask your health care provider or pharmacist if you have questions. COMMON BRAND NAME(S): Paraplatin What should I tell my care team before I take this medication? They need to know if you have any of these conditions: Blood disorders Hearing problems Kidney disease Recent or ongoing radiation therapy An unusual or allergic reaction to carboplatin, cisplatin, other medications, foods, dyes, or preservatives Pregnant or trying to get  pregnant Breast-feeding How should I use this medication? This medication is injected into a vein. It is given by your care team in a hospital or clinic setting. Talk to your care team about the use of this medication in children. Special care may be needed. Overdosage: If you think you have taken too much of this medicine contact a poison control center or emergency room at once. NOTE: This medicine is only for you. Do not share this medicine with others. What if I miss a dose? Keep appointments for follow-up doses. It is important not to miss your dose. Call your care team if you are unable to keep an appointment. What may interact with this medication? Medications for seizures Some antibiotics, such as amikacin, gentamicin, neomycin, streptomycin, tobramycin Vaccines This list may not describe all possible interactions. Give your health care provider a list of all the medicines, herbs, non-prescription drugs, or dietary supplements you use. Also tell them if you smoke, drink alcohol, or use illegal drugs. Some items may interact with your medicine. What should I watch for while using this medication? Your condition will be monitored carefully while you are receiving this medication. You may need blood work  while taking this medication. This medication may make you feel generally unwell. This is not uncommon, as chemotherapy can affect healthy cells as well as cancer cells. Report any side effects. Continue your course of treatment even though you feel ill unless your care team tells you to stop. In some cases, you may be given additional medications to help with side effects. Follow all directions for their use. This medication may increase your risk of getting an infection. Call your care team for advice if you get a fever, chills, sore throat, or other symptoms of a cold or flu. Do not treat yourself. Try to avoid being around people who are sick. Avoid taking medications that contain  aspirin, acetaminophen, ibuprofen, naproxen, or ketoprofen unless instructed by your care team. These medications may hide a fever. Be careful brushing or flossing your teeth or using a toothpick because you may get an infection or bleed more easily. If you have any dental work done, tell your dentist you are receiving this medication. Talk to your care team if you wish to become pregnant or think you might be pregnant. This medication can cause serious birth defects. Talk to your care team about effective forms of contraception. Do not breast-feed while taking this medication. What side effects may I notice from receiving this medication? Side effects that you should report to your care team as soon as possible: Allergic reactions--skin rash, itching, hives, swelling of the face, lips, tongue, or throat Infection--fever, chills, cough, sore throat, wounds that don't heal, pain or trouble when passing urine, general feeling of discomfort or being unwell Low red blood cell level--unusual weakness or fatigue, dizziness, headache, trouble breathing Pain, tingling, or numbness in the hands or feet, muscle weakness, change in vision, confusion or trouble speaking, loss of balance or coordination, trouble walking, seizures Unusual bruising or bleeding Side effects that usually do not require medical attention (report to your care team if they continue or are bothersome): Hair loss Nausea Unusual weakness or fatigue Vomiting This list may not describe all possible side effects. Call your doctor for medical advice about side effects. You may report side effects to FDA at 1-800-FDA-1088. Where should I keep my medication? This medication is given in a hospital or clinic. It will not be stored at home. NOTE: This sheet is a summary. It may not cover all possible information. If you have questions about this medicine, talk to your doctor, pharmacist, or health care provider.  2023 Elsevier/Gold Standard  (2022-03-17 00:00:00)

## 2022-08-07 NOTE — Progress Notes (Signed)
START OFF PATHWAY REGIMEN - Other   [id number]:Carboplatin AUC=2 IV D1,8 + Gemcitabine 1,000 mg/m2 IV D1,8 q21 Days:   A cycle is every 21 days:     Carboplatin      Gemcitabine   **Always confirm dose/schedule in your pharmacy ordering system**  Patient Characteristics: Intent of Therapy: Non-Curative / Palliative Intent, Discussed with Patient

## 2022-08-07 NOTE — Progress Notes (Signed)
Patient on plan of care prior to pathways. 

## 2022-08-07 NOTE — Progress Notes (Signed)
Hematology/Oncology Consult note [facility]  [phone] [phone]  Patient Care Team: Steele Sizer, MD as PCP - General (Family Medicine) Clent Jacks, RN as Oncology Nurse Navigator Germaine Pomfret, [facility] (Pharmacist) Sindy Guadeloupe, MD as Consulting Physician (Oncology) Borders, Kirt Boys, NP as Nurse Practitioner [facility] and Palliative Medicine)   Name of the patient: Janice Short  [id number]  1961-10-27   Date of visit: 08/07/22  Diagnosis- metastatic intrahepatic cholangiocarcinoma  Chief complaint/ Reason for visit-on treatment assessment prior to cycle 15-day 1 of carboplatin gemcitabine chemotherapy  Heme/Onc history:  Patient is a 61 year old African-American female with a past medical history significant for hypertension hyperlipidemia and type 2 diabetes who presented to the ER with symptoms of cramping abdominal pain and nausea.  This was followed by right upper quadrant ultrasound which showed multiple echogenic masses in the liver with nodular contour of the liver possibly suggestive of cirrhosis.  Several nodular masses in the vicinity of the pancreatic head and porta hepatic lymph nodes.  This was followed by an MRI abdomen and MRCP.  It showed right hepatic lobe masslike area measuring 4.9 x 2.9 cm.  Diffusion abnormality in the left hepatic lobe with mild left-sided biliary ductal dilatation and another area of abnormality measuring 1.4 x 1 cm.  Bulky celiac adenopathy 3 x 2.1 cm tracking into gastrohepatic recess.  Suspected portal venous invasion with convex protrusion in the right portal vein.  Left adrenal lesion measuring 2.9 x 1.5 cm.  Findings concerning for cholangiocarcinoma or biphenotypic cholangiole hepatocellular neoplasm.     Patient underwent CT-guided liver biopsy Which was consistent with adenocarcinoma poorly differentiated.  Immunohistochemistry showed CK7 positivity, CDX2 negative, Heppar negative,  GATA3 negative.  CK19 positive.  Differentials include cholangiocarcinoma, pancreatic, upper GI.  Lung and breast less likely.  However given CK19 positivity most compatible with cholangiocarcinoma.   Patient's case was discussed at tumor board and it was felt as if the lymphadenopathy was more like a confluent primary tumor mass.  Patient was referred to [facility] for second opinion to see if she would be a candidate for surgery.However upon their review of the PET scan it was deemed that this was indeed periportal adenopathy.  They also did a CT chest abdomen and pelvis with MIPS protocol and she was also found to have portocaval lymph node measuring 2.4 cm which was more consistent with metastatic disease.  She was deemed unresectable   NGS testing showed BRCA2, FGFR2, PIK3CA, RAD 54L, CDC73, CDKN2 A/B CR EBBP rearrangement exon 31    Patient has been getting gemcitabine cisplatin chemotherapy 2 weeks on 1 week off since August 2022.  Patient had an allergic reaction to cisplatin and therefore was switched to carboplatin gemcitabine chemotherapy which she gets week on week off    Interval history-tolerating chemotherapy well so far.  Denies any tingling numbness in her hands and feet.  Denies any abdominal pain nausea nausea or vomiting  ECOG PS- 1 Pain scale- 0 Opioid associated constipation- no  Review of systems- Review of Systems  Constitutional:  Negative for chills, fever, malaise/fatigue and weight loss.  HENT:  Negative for congestion, ear discharge and nosebleeds.   Eyes:  Negative for blurred vision.  Respiratory:  Negative for cough, hemoptysis, sputum production, shortness of breath and wheezing.   Cardiovascular:  Negative for chest pain, palpitations, orthopnea and claudication.  Gastrointestinal:  Negative for abdominal pain, blood in stool, constipation, diarrhea, heartburn, melena, nausea and vomiting.  Genitourinary:  Negative for dysuria, flank pain, frequency, hematuria and  urgency.  Musculoskeletal:  Negative for back pain, joint pain and myalgias.  Skin:  Negative for rash.  Neurological:  Negative for dizziness, tingling, focal weakness, seizures, weakness and headaches.  Endo/Heme/Allergies:  Does not bruise/bleed easily.  Psychiatric/Behavioral:  Negative for depression and suicidal ideas. The patient does not have insomnia.       Allergies  Allergen Reactions   Other Shortness Of Breath    Cat dander and grass pollen   Cisplatin Itching   Bydureon [Exenatide] Other (See Comments)    Pain with injection not allergy     Past Medical History:  Diagnosis Date   Allergy    Asthma    Carpal tunnel syndrome on left    Cervical radiculitis    Chronic osteoarthritis    Corns and callosity    Depression    Dermatophytosis of foot    Diabetes mellitus without complication (HCC)    Gout    Hyperlipidemia    Hypertension    Impingement syndrome of left shoulder    Intrahepatic cholangiocarcinoma (HCC)    Lumbosacral neuritis    Microalbuminuria    Obesity    Supraventricular tachycardia ([place]) 07/01/2015   SVT (supraventricular tachycardia) (HCC)    Tenosynovitis of wrist    Vitamin D deficiency      Past Surgical History:  Procedure Laterality Date   ABDOMINAL HYSTERECTOMY     COLONOSCOPY WITH PROPOFOL N/A 05/06/2021   Procedure: COLONOSCOPY WITH PROPOFOL;  Surgeon: Lucilla Lame, MD;  Location: [facility] ENDOSCOPY;  Service: Endoscopy;  Laterality: N/A;   ESOPHAGOGASTRODUODENOSCOPY (EGD) WITH PROPOFOL N/A 05/06/2021   Procedure: ESOPHAGOGASTRODUODENOSCOPY (EGD) WITH PROPOFOL;  Surgeon: Lucilla Lame, MD;  Location: [facility] ENDOSCOPY;  Service: Endoscopy;  Laterality: N/A;   PORTA CATH INSERTION N/A 07/17/2021   Procedure: PORTA CATH INSERTION;  Surgeon: Algernon Huxley, MD;  Location: [place] [facility];  Service: Cardiovascular;  Laterality: N/A;    Social History   Socioeconomic History   Marital status: Single    Spouse name: Not on file    Number of children: 2   Years of education: Not on file   Highest education level: Not on file  Occupational History   Occupation: disable     Comment: from chronic back pain, 2019  Tobacco Use   Smoking status: Some Days    Types: Cigarettes   Smokeless tobacco: Never   Tobacco comments:    1 per day 01/30/21 does not inhale it  Vaping Use   Vaping Use: Never used  Substance and Sexual Activity   Alcohol use: Not Currently   Drug use: No   Sexual activity: Not Currently  Other Topics Concern   Not on file  Social History Narrative   She is on disability for chronic back pain. Pt's son lives with her   Social Determinants of Health   Financial Resource Strain: Medium Risk (02/03/2022)   Overall Financial Resource Strain (CARDIA)    Difficulty of Paying Living Expenses: Somewhat hard  Food Insecurity: Food Insecurity Present (02/03/2022)   Hunger Vital Sign    Worried About Running Out of Food in the Last Year: Sometimes true    Ran Out of Food in the Last Year: Never true  Transportation Needs: No Transportation Needs (04/24/2022)   PRAPARE - Hydrologist (Medical): No    Lack of Transportation (Non-Medical): No  Physical Activity: Inactive (02/03/2022)   Exercise Vital  Sign    Days of Exercise per Week: 0 days    Minutes of Exercise per Session: 0 min  Stress: No Stress Concern Present (02/03/2022)   [place]    Feeling of Stress : Only a little  Social Connections: Moderately Isolated (02/03/2022)   Social Connection and Isolation Panel [NHANES]    Frequency of Communication with Friends and Family: More than three times a week    Frequency of Social Gatherings with Friends and Family: Three times a week    Attends Religious Services: More than 4 times per year    Active Member of Clubs or Organizations: No    Attends Archivist Meetings: Never    Marital Status:  Never married  Intimate Partner Violence: Not At Risk (02/03/2022)   Humiliation, Afraid, Rape, and Kick questionnaire    Fear of Current or Ex-Partner: No    Emotionally Abused: No    Physically Abused: No    Sexually Abused: No    Family History  Problem Relation Age of Onset   Heart attack Mother    CAD Mother    Kidney disease Father      Current Outpatient Medications:    albuterol (VENTOLIN HFA) 108 (90 Base) MCG/ACT inhaler, INHALE 2 PUFFS BY MOUTH EVERY 6 HOURS AS NEEDED FOR WHEEZING AND FOR SHORTNESS OF BREATH, Disp: 18 g, Rfl: 2   aspirin 81 MG tablet, Take 1 tablet by mouth daily., Disp: , Rfl:    atorvastatin (LIPITOR) 40 MG tablet, Take 1 tablet (40 mg total) by mouth at bedtime., Disp: 90 tablet, Rfl: 1   blood glucose meter kit and supplies, Dispense based on patient and insurance preference. Use up to four times daily as directed. (FOR ICD-10 E10.9, E11.9)., Disp: 1 each, Rfl: 0   diphenhydramine-acetaminophen (TYLENOL PM) 25-500 MG TABS tablet, Take 1 tablet by mouth at bedtime as needed., Disp: , Rfl:    escitalopram (LEXAPRO) 20 MG tablet, TAKE ONE TABLET BY MOUTH DAILY AT 9AM, Disp: 90 tablet, Rfl: 1   fluticasone (FLONASE) 50 MCG/ACT nasal spray, Place 2 sprays into both nostrils daily as needed. USE 2 SPRAY(S) IN EACH NOSTRIL AT BEDTIME, Disp: 16 g, Rfl: 0   Fluticasone-Umeclidin-Vilant (TRELEGY ELLIPTA) 100-62.5-25 MCG/INH AEPB, Take 1 puff by mouth daily., Disp: 180 each, Rfl: 1   gabapentin (NEURONTIN) 300 MG capsule, TAKE TWO CAPSULES BY MOUTH TWICE DAILY @ 9AM & 5PM, Disp: 360 capsule, Rfl: 1   insulin degludec (TRESIBA FLEXTOUCH) 100 UNIT/ML FlexTouch Pen, INJECT 12-30 UNITS OF INSULIN INTO THE SKIN DAILY TO KEEP FASTING GLUCOSE BETWEEN 100-140, Disp: 15 mL, Rfl: 1   Insulin Pen Needle 31G X 8 MM MISC, 1 each by Does not apply route daily., Disp: 100 each, Rfl: 1   lidocaine-prilocaine (EMLA) cream, Apply 1 application. topically as needed. Apply to port and  cover with saran wrap 1-2 hours prior to port access, Disp: 30 g, Rfl: 3   folic acid (FOLVITE) 1 MG tablet, Take 1 tablet (1 mg total) by mouth daily. (Patient not taking: Reported on 02/13/2022), Disp: 30 tablet, Rfl: 3   ondansetron (ZOFRAN) 4 MG tablet, Take 1 tablet (4 mg total) by mouth every 8 (eight) hours as needed. (Patient not taking: Reported on 06/19/2022), Disp: 20 tablet, Rfl: 0 No current facility-administered medications for this visit.  Facility-Administered Medications Ordered in Other Visits:    heparin lock flush 100 UNIT/ML injection, , , ,  Physical exam:  Vitals:   08/07/22 0828  BP: (!) 146/77  Pulse: 75  Resp: 16  Temp: (!) 96.4 F (35.8 C)  SpO2: 100%  Weight: 163 lb 12.8 oz (74.3 kg)   Physical Exam Constitutional:      General: She is not in acute distress. Cardiovascular:     Rate and Rhythm: Normal rate and regular rhythm.     Heart sounds: Normal heart sounds.  Pulmonary:     Effort: Pulmonary effort is normal.     Breath sounds: Normal breath sounds.  Abdominal:     General: Bowel sounds are normal.     Palpations: Abdomen is soft.  Skin:    General: Skin is warm and dry.  Neurological:     Mental Status: She is alert and oriented to person, place, and time.         Latest Ref Rng & Units 08/07/2022    8:06 AM  CMP  Glucose 70 - 99 mg/dL 152   BUN 8 - 23 mg/dL 12   Creatinine 0.44 - 1.00 mg/dL 1.11   Sodium 135 - 145 mmol/L 138   Potassium 3.5 - 5.1 mmol/L 4.5   Chloride 98 - 111 mmol/L 110   CO2 22 - 32 mmol/L 22   Calcium 8.9 - 10.3 mg/dL 8.0   Total Protein 6.5 - 8.1 g/dL 7.8   Total Bilirubin 0.3 - 1.2 mg/dL 1.1   Alkaline Phos 38 - 126 U/L 320   AST 15 - 41 U/L 33   ALT 0 - 44 U/L 25       Latest Ref Rng & Units 08/07/2022    8:06 AM  CBC  WBC 4.0 - 10.5 K/uL 7.7   Hemoglobin 12.0 - 15.0 g/dL 10.4   Hematocrit 36.0 - 46.0 % 31.9   Platelets 150 - 400 K/uL 91          Assessment and plan- Patient is a 61 y.o.  female  with stage IV intrahepatic cholangiocarcinoma.  She is here for on treatment assessment prior to cycle 16-day 1 of carboplatin gemcitabine chemotherapy  Patient has mild thrombocytopenia with a platelet of 91.  Okay to proceed with chemotherapy today.  She is receiving carboplatin AUC 1.5 along with gemcitabine 100 mg per metered square.  She gets this every other week for better tolerance and given her cytopenias.  She will directly proceed for cycle 16-day 15 in 2 weeks and I will see her back in 4 weeks for cycle 17-day 1.  She will need CT chest abdomen and pelvis with contrast prior.   Visit Diagnosis 1. Intrahepatic cholangiocarcinoma ([place])   2. Encounter for antineoplastic chemotherapy   3. Chemotherapy-induced thrombocytopenia      Dr. Randa Evens, MD, MPH [facility] at [facility] [phone] 08/07/2022 12:56 PM

## 2022-08-08 ENCOUNTER — Other Ambulatory Visit: Payer: Self-pay | Admitting: Family Medicine

## 2022-08-08 DIAGNOSIS — J449 Chronic obstructive pulmonary disease, unspecified: Secondary | ICD-10-CM

## 2022-08-12 ENCOUNTER — Telehealth: Payer: Self-pay | Admitting: Family Medicine

## 2022-08-12 NOTE — Telephone Encounter (Signed)
Janice Short with [facility] pharamacy called in stating the patients insurance will not cover the prescription for Ventolin and wanted to know if they can change it to ProAir or Albuterol? Please assist further.  [facility] - [place], [address] Phone:  [phone]  Fax:  [phone]

## 2022-08-13 NOTE — Telephone Encounter (Signed)
Spoke with Pharmacist and ProAir was authorized per Dr. Ancil Boozer.

## 2022-08-14 ENCOUNTER — Other Ambulatory Visit: Payer: Self-pay | Admitting: Family Medicine

## 2022-08-14 DIAGNOSIS — J449 Chronic obstructive pulmonary disease, unspecified: Secondary | ICD-10-CM

## 2022-08-18 ENCOUNTER — Other Ambulatory Visit: Payer: Self-pay

## 2022-08-18 MED ORDER — LIDOCAINE-PRILOCAINE 2.5-2.5 % EX CREA: 1.0000 | TOPICAL_CREAM | CUTANEOUS | 3 refills | Status: DC | PRN

## 2022-08-20 MED FILL — Dexamethasone Sodium Phosphate Inj 100 MG/10ML: INTRAMUSCULAR | Qty: 1 | Status: AC

## 2022-08-21 ENCOUNTER — Other Ambulatory Visit: Payer: Self-pay | Admitting: Oncology

## 2022-08-21 ENCOUNTER — Inpatient Hospital Stay: Payer: Medicare Other

## 2022-08-21 DIAGNOSIS — C221 Intrahepatic bile duct carcinoma: Secondary | ICD-10-CM

## 2022-08-21 DIAGNOSIS — Z5111 Encounter for antineoplastic chemotherapy: Secondary | ICD-10-CM | POA: Diagnosis not present

## 2022-08-21 LAB — CBC WITH DIFFERENTIAL/PLATELET
Abs Immature Granulocytes: 0.01 10*3/uL (ref 0.00–0.07)
Basophils Absolute: 0 10*3/uL (ref 0.0–0.1)
Basophils Relative: 0 %
Eosinophils Absolute: 0.2 10*3/uL (ref 0.0–0.5)
Eosinophils Relative: 2 %
HCT: 31.2 % — ABNORMAL LOW (ref 36.0–46.0)
Hemoglobin: 10.1 g/dL — ABNORMAL LOW (ref 12.0–15.0)
Immature Granulocytes: 0 %
Lymphocytes Relative: 28 %
Lymphs Abs: 1.8 10*3/uL (ref 0.7–4.0)
MCH: 32.6 pg (ref 26.0–34.0)
MCHC: 32.4 g/dL (ref 30.0–36.0)
MCV: 100.6 fL — ABNORMAL HIGH (ref 80.0–100.0)
Monocytes Absolute: 0.8 10*3/uL (ref 0.1–1.0)
Monocytes Relative: 12 %
Neutro Abs: 3.8 10*3/uL (ref 1.7–7.7)
Neutrophils Relative %: 58 %
Platelets: 74 10*3/uL — ABNORMAL LOW (ref 150–400)
RBC: 3.1 MIL/uL — ABNORMAL LOW (ref 3.87–5.11)
RDW: 21.9 % — ABNORMAL HIGH (ref 11.5–15.5)
WBC: 6.7 10*3/uL (ref 4.0–10.5)
nRBC: 0 % (ref 0.0–0.2)

## 2022-08-21 LAB — COMPREHENSIVE METABOLIC PANEL
ALT: 22 U/L (ref 0–44)
AST: 30 U/L (ref 15–41)
Albumin: 2.7 g/dL — ABNORMAL LOW (ref 3.5–5.0)
Alkaline Phosphatase: 300 U/L — ABNORMAL HIGH (ref 38–126)
Anion gap: 3 — ABNORMAL LOW (ref 5–15)
BUN: 16 mg/dL (ref 8–23)
CO2: 22 mmol/L (ref 22–32)
Calcium: 8.2 mg/dL — ABNORMAL LOW (ref 8.9–10.3)
Chloride: 113 mmol/L — ABNORMAL HIGH (ref 98–111)
Creatinine, Ser: 1.29 mg/dL — ABNORMAL HIGH (ref 0.44–1.00)
GFR, Estimated: 47 mL/min — ABNORMAL LOW (ref >60)
Glucose, Bld: 176 mg/dL — ABNORMAL HIGH (ref 70–99)
Potassium: 4.8 mmol/L (ref 3.5–5.1)
Sodium: 138 mmol/L (ref 135–145)
Total Bilirubin: 1.2 mg/dL (ref 0.3–1.2)
Total Protein: 7.8 g/dL (ref 6.5–8.1)

## 2022-08-21 MED ORDER — HEPARIN SOD (PORK) LOCK FLUSH 100 UNIT/ML IV SOLN
500.0000 [IU] | Freq: Once | INTRAVENOUS | Status: AC
  Administered 2022-08-21: 500 [IU] via INTRAVENOUS
  Filled 2022-08-21: qty 5

## 2022-08-28 ENCOUNTER — Ambulatory Visit
Admission: RE | Admit: 2022-08-28 | Discharge: 2022-08-28 | Disposition: A | Discharge status: Home / Self Care | Payer: Medicare Other | Source: Ambulatory Visit | Attending: Oncology | Admitting: Oncology

## 2022-08-28 DIAGNOSIS — C221 Intrahepatic bile duct carcinoma: Secondary | ICD-10-CM

## 2022-08-28 MED ORDER — IOHEXOL 300 MG/ML  SOLN
100.0000 mL | Freq: Once | INTRAMUSCULAR | Status: AC | PRN
  Administered 2022-08-28: 100 mL via INTRAVENOUS

## 2022-08-28 MED ORDER — HEPARIN SOD (PORK) LOCK FLUSH 100 UNIT/ML IV SOLN
500.0000 [IU] | Freq: Once | INTRAVENOUS | Status: AC
  Administered 2022-08-28: 500 [IU] via INTRAVENOUS

## 2022-09-03 MED FILL — Dexamethasone Sodium Phosphate Inj 100 MG/10ML: INTRAMUSCULAR | Qty: 1 | Status: AC

## 2022-09-04 ENCOUNTER — Encounter: Payer: Self-pay | Admitting: Oncology

## 2022-09-04 ENCOUNTER — Inpatient Hospital Stay (HOSPITAL_BASED_OUTPATIENT_CLINIC_OR_DEPARTMENT_OTHER): Payer: Medicare Other | Admitting: Oncology

## 2022-09-04 ENCOUNTER — Inpatient Hospital Stay: Payer: Medicare Other

## 2022-09-04 VITALS — BP 133/75 | HR 83 | Temp 98.2°F | Resp 16 | Ht 64.0 in | Wt 169.3 lb

## 2022-09-04 DIAGNOSIS — C221 Intrahepatic bile duct carcinoma: Secondary | ICD-10-CM

## 2022-09-04 DIAGNOSIS — Z5111 Encounter for antineoplastic chemotherapy: Secondary | ICD-10-CM

## 2022-09-04 DIAGNOSIS — Z95828 Presence of other vascular implants and grafts: Secondary | ICD-10-CM

## 2022-09-04 LAB — CBC WITH DIFFERENTIAL/PLATELET
Abs Immature Granulocytes: 0.03 10*3/uL (ref 0.00–0.07)
Basophils Absolute: 0.1 10*3/uL (ref 0.0–0.1)
Basophils Relative: 1 %
Eosinophils Absolute: 0.2 10*3/uL (ref 0.0–0.5)
Eosinophils Relative: 3 %
HCT: 32.3 % — ABNORMAL LOW (ref 36.0–46.0)
Hemoglobin: 10.2 g/dL — ABNORMAL LOW (ref 12.0–15.0)
Immature Granulocytes: 0 %
Lymphocytes Relative: 26 %
Lymphs Abs: 1.9 10*3/uL (ref 0.7–4.0)
MCH: 31.9 pg (ref 26.0–34.0)
MCHC: 31.6 g/dL (ref 30.0–36.0)
MCV: 100.9 fL — ABNORMAL HIGH (ref 80.0–100.0)
Monocytes Absolute: 0.7 10*3/uL (ref 0.1–1.0)
Monocytes Relative: 10 %
Neutro Abs: 4.3 10*3/uL (ref 1.7–7.7)
Neutrophils Relative %: 60 %
Platelets: 118 10*3/uL — ABNORMAL LOW (ref 150–400)
RBC: 3.2 MIL/uL — ABNORMAL LOW (ref 3.87–5.11)
RDW: 20.2 % — ABNORMAL HIGH (ref 11.5–15.5)
WBC: 7.1 10*3/uL (ref 4.0–10.5)
nRBC: 0 % (ref 0.0–0.2)

## 2022-09-04 LAB — COMPREHENSIVE METABOLIC PANEL
ALT: 16 U/L (ref 0–44)
AST: 27 U/L (ref 15–41)
Albumin: 2.7 g/dL — ABNORMAL LOW (ref 3.5–5.0)
Alkaline Phosphatase: 243 U/L — ABNORMAL HIGH (ref 38–126)
Anion gap: 0 — ABNORMAL LOW (ref 5–15)
BUN: 16 mg/dL (ref 8–23)
CO2: 22 mmol/L (ref 22–32)
Calcium: 7.8 mg/dL — ABNORMAL LOW (ref 8.9–10.3)
Chloride: 114 mmol/L — ABNORMAL HIGH (ref 98–111)
Creatinine, Ser: 1.32 mg/dL — ABNORMAL HIGH (ref 0.44–1.00)
GFR, Estimated: 45 mL/min — ABNORMAL LOW (ref >60)
Glucose, Bld: 233 mg/dL — ABNORMAL HIGH (ref 70–99)
Potassium: 5.1 mmol/L (ref 3.5–5.1)
Sodium: 136 mmol/L (ref 135–145)
Total Bilirubin: 1 mg/dL (ref 0.3–1.2)
Total Protein: 7.6 g/dL (ref 6.5–8.1)

## 2022-09-04 MED ORDER — SODIUM CHLORIDE 0.9 % IV SOLN
1400.0000 mg | Freq: Once | INTRAVENOUS | Status: AC
  Administered 2022-09-04: 1400 mg via INTRAVENOUS
  Filled 2022-09-04: qty 26.3

## 2022-09-04 MED ORDER — SODIUM CHLORIDE 0.9% FLUSH
10.0000 mL | Freq: Once | INTRAVENOUS | Status: AC
  Administered 2022-09-04: 10 mL via INTRAVENOUS
  Filled 2022-09-04: qty 10

## 2022-09-04 MED ORDER — SODIUM CHLORIDE 0.9 % IV SOLN
114.3000 mg | Freq: Once | INTRAVENOUS | Status: AC
  Administered 2022-09-04: 110 mg via INTRAVENOUS
  Filled 2022-09-04: qty 11

## 2022-09-04 MED ORDER — DIPHENHYDRAMINE HCL 50 MG/ML IJ SOLN
25.0000 mg | Freq: Once | INTRAMUSCULAR | Status: AC
  Administered 2022-09-04: 25 mg via INTRAVENOUS
  Filled 2022-09-04: qty 1

## 2022-09-04 MED ORDER — SODIUM CHLORIDE 0.9 % IV SOLN
10.0000 mg | Freq: Once | INTRAVENOUS | Status: AC
  Administered 2022-09-04: 10 mg via INTRAVENOUS
  Filled 2022-09-04: qty 1
  Filled 2022-09-04: qty 10

## 2022-09-04 MED ORDER — PALONOSETRON HCL INJECTION 0.25 MG/5ML
0.2500 mg | Freq: Once | INTRAVENOUS | Status: AC
  Administered 2022-09-04: 0.25 mg via INTRAVENOUS
  Filled 2022-09-04: qty 5

## 2022-09-04 MED ORDER — FAMOTIDINE IN NACL 20-0.9 MG/50ML-% IV SOLN
20.0000 mg | Freq: Once | INTRAVENOUS | Status: AC
  Administered 2022-09-04: 20 mg via INTRAVENOUS
  Filled 2022-09-04: qty 50

## 2022-09-04 MED ORDER — SODIUM CHLORIDE 0.9 % IV SOLN
Freq: Once | INTRAVENOUS | Status: AC
  Filled 2022-09-04: qty 250

## 2022-09-04 MED ORDER — HEPARIN SOD (PORK) LOCK FLUSH 100 UNIT/ML IV SOLN
500.0000 [IU] | Freq: Once | INTRAVENOUS | Status: AC | PRN
  Administered 2022-09-04: 500 [IU]
  Filled 2022-09-04: qty 5

## 2022-09-04 NOTE — Progress Notes (Signed)
Hematology/Oncology Consult note [facility]  Telephone:([phone] [phone]  Patient Care Team: Steele Sizer, MD as PCP - General (Family Medicine) Clent Jacks, RN as Oncology Nurse Navigator Germaine Pomfret, [facility] (Pharmacist) Sindy Guadeloupe, MD as Consulting Physician (Oncology) Borders, Kirt Boys, NP as Nurse Practitioner [facility] and Palliative Medicine)   Name of the patient: Janice Short  [id number]  [birth year]   Date of visit: 09/04/22  Diagnosis- metastatic intrahepatic cholangiocarcinoma  Chief complaint/ Reason for visit-on treatment assessment prior to cycle 16-day 1 of carboplatin gemcitabine chemotherapy  Heme/Onc history: Patient is a 61 year old African-American female with a past medical history significant for hypertension hyperlipidemia and type 2 diabetes who presented to the ER with symptoms of cramping abdominal pain and nausea.  This was followed by right upper quadrant ultrasound which showed multiple echogenic masses in the liver with nodular contour of the liver possibly suggestive of cirrhosis.  Several nodular masses in the vicinity of the pancreatic head and porta hepatic lymph nodes.  This was followed by an MRI abdomen and MRCP.  It showed right hepatic lobe masslike area measuring 4.9 x 2.9 cm.  Diffusion abnormality in the left hepatic lobe with mild left-sided biliary ductal dilatation and another area of abnormality measuring 1.4 x 1 cm.  Bulky celiac adenopathy 3 x 2.1 cm tracking into gastrohepatic recess.  Suspected portal venous invasion with convex protrusion in the right portal vein.  Left adrenal lesion measuring 2.9 x 1.5 cm.  Findings concerning for cholangiocarcinoma or biphenotypic cholangiole hepatocellular neoplasm.     Patient underwent CT-guided liver biopsy Which was consistent with adenocarcinoma poorly differentiated.  Immunohistochemistry showed CK7 positivity, CDX2 negative, Heppar negative,  GATA3 negative.  CK19 positive.  Differentials include cholangiocarcinoma, pancreatic, upper GI.  Lung and breast less likely.  However given CK19 positivity most compatible with cholangiocarcinoma.   Patient's case was discussed at tumor board and it was felt as if the lymphadenopathy was more like a confluent primary tumor mass.  Patient was referred to [facility] for second opinion to see if she would be a candidate for surgery.However upon their review of the PET scan it was deemed that this was indeed periportal adenopathy.  They also did a CT chest abdomen and pelvis with MIPS protocol and she was also found to have portocaval lymph node measuring 2.4 cm which was more consistent with metastatic disease.  She was deemed unresectable   NGS testing showed BRCA2, FGFR2, PIK3CA, RAD 54L, CDC73, CDKN2 A/B CR EBBP rearrangement exon 31    Patient has been getting gemcitabine cisplatin chemotherapy 2 weeks on 1 week off since August 2022.  Patient had an allergic reaction to cisplatin and therefore was switched to carboplatin gemcitabine chemotherapy which she gets week on week off      Interval history-patient is tolerating chemotherapy well without any significant side effects.  Denies any nausea or vomiting.  ECOG PS- 1 Pain scale- 0   Review of systems- Review of Systems  Constitutional:  Negative for chills, fever, malaise/fatigue and weight loss.  HENT:  Negative for congestion, ear discharge and nosebleeds.   Eyes:  Negative for blurred vision.  Respiratory:  Negative for cough, hemoptysis, sputum production, shortness of breath and wheezing.   Cardiovascular:  Negative for chest pain, palpitations, orthopnea and claudication.  Gastrointestinal:  Negative for abdominal pain, blood in stool, constipation, diarrhea, heartburn, melena, nausea and vomiting.  Genitourinary:  Negative for dysuria, flank pain, frequency, hematuria and  urgency.  Musculoskeletal:  Negative for back pain, joint pain  and myalgias.  Skin:  Negative for rash.  Neurological:  Negative for dizziness, tingling, focal weakness, seizures, weakness and headaches.  Endo/Heme/Allergies:  Does not bruise/bleed easily.  Psychiatric/Behavioral:  Negative for depression and suicidal ideas. The patient does not have insomnia.       Allergies  Allergen Reactions   Other Shortness Of Breath    Cat dander and grass pollen   Cisplatin Itching   Bydureon [Exenatide] Other (See Comments)    Pain with injection not allergy     Past Medical History:  Diagnosis Date   Allergy    Asthma    Carpal tunnel syndrome on left    Cervical radiculitis    Chronic osteoarthritis    Corns and callosity    Depression    Dermatophytosis of foot    Diabetes mellitus without complication (HCC)    Gout    Hyperlipidemia    Hypertension    Impingement syndrome of left shoulder    Intrahepatic cholangiocarcinoma (HCC)    Lumbosacral neuritis    Microalbuminuria    Obesity    Supraventricular tachycardia ([place]) 07/01/2015   SVT (supraventricular tachycardia) (HCC)    Tenosynovitis of wrist    Vitamin D deficiency      Past Surgical History:  Procedure Laterality Date   ABDOMINAL HYSTERECTOMY     COLONOSCOPY WITH PROPOFOL N/A 05/06/2021   Procedure: COLONOSCOPY WITH PROPOFOL;  Surgeon: Lucilla Lame, MD;  Location: [facility] ENDOSCOPY;  Service: Endoscopy;  Laterality: N/A;   ESOPHAGOGASTRODUODENOSCOPY (EGD) WITH PROPOFOL N/A 05/06/2021   Procedure: ESOPHAGOGASTRODUODENOSCOPY (EGD) WITH PROPOFOL;  Surgeon: Lucilla Lame, MD;  Location: [facility] ENDOSCOPY;  Service: Endoscopy;  Laterality: N/A;   PORTA CATH INSERTION N/A 07/17/2021   Procedure: PORTA CATH INSERTION;  Surgeon: Algernon Huxley, MD;  Location: [place] [facility] LAB;  Service: Cardiovascular;  Laterality: N/A;    Social History   Socioeconomic History   Marital status: Single    Spouse name: Not on file   Number of children: 2   Years of education: Not on file   Highest  education level: Not on file  Occupational History   Occupation: disable     Comment: from chronic back pain, 2019  Tobacco Use   Smoking status: Some Days    Types: Cigarettes   Smokeless tobacco: Never   Tobacco comments:    1 per day 01/30/21 does not inhale it  Vaping Use   Vaping Use: Never used  Substance and Sexual Activity   Alcohol use: Not Currently   Drug use: Not Currently    Types: Benzodiazepines   Sexual activity: Not Currently  Other Topics Concern   Not on file  Social History Narrative   She is on disability for chronic back pain. Pt's son lives with her   Social Determinants of Health   Financial Resource Strain: Medium Risk (02/03/2022)   Overall Financial Resource Strain (CARDIA)    Difficulty of Paying Living Expenses: Somewhat hard  Food Insecurity: Food Insecurity Present (02/03/2022)   Hunger Vital Sign    Worried About Running Out of Food in the Last Year: Sometimes true    Ran Out of Food in the Last Year: Never true  Transportation Needs: No Transportation Needs (04/24/2022)   PRAPARE - Hydrologist (Medical): No    Lack of Transportation (Non-Medical): No  Physical Activity: Inactive (02/03/2022)   Exercise Vital Sign  Days of Exercise per Week: 0 days    Minutes of Exercise per Session: 0 min  Stress: No Stress Concern Present (02/03/2022)   [place]    Feeling of Stress : Only a little  Social Connections: Moderately Isolated (02/03/2022)   Social Connection and Isolation Panel [NHANES]    Frequency of Communication with Friends and Family: More than three times a week    Frequency of Social Gatherings with Friends and Family: Three times a week    Attends Religious Services: More than 4 times per year    Active Member of Clubs or Organizations: No    Attends Archivist Meetings: Never    Marital Status: Never married  Intimate Partner  Violence: Not At Risk (02/03/2022)   Humiliation, Afraid, Rape, and Kick questionnaire    Fear of Current or Ex-Partner: No    Emotionally Abused: No    Physically Abused: No    Sexually Abused: No    Family History  Problem Relation Age of Onset   Heart attack Mother    CAD Mother    Kidney disease Father      Current Outpatient Medications:    albuterol (VENTOLIN HFA) 108 (90 Base) MCG/ACT inhaler, INHALE TWO PUFFS BY MOUTH EVERY 6 HOURS AS NEEDED FOR WHEEZING AND FOR SHORTNESS OF BREATH (BULK), Disp: 18 g, Rfl: 0   aspirin 81 MG tablet, Take 1 tablet by mouth daily., Disp: , Rfl:    atorvastatin (LIPITOR) 40 MG tablet, Take 1 tablet (40 mg total) by mouth at bedtime., Disp: 90 tablet, Rfl: 1   blood glucose meter kit and supplies, Dispense based on patient and insurance preference. Use up to four times daily as directed. (FOR ICD-10 E10.9, E11.9)., Disp: 1 each, Rfl: 0   diphenhydramine-acetaminophen (TYLENOL PM) 25-500 MG TABS tablet, Take 1 tablet by mouth at bedtime as needed., Disp: , Rfl:    escitalopram (LEXAPRO) 20 MG tablet, TAKE ONE TABLET BY MOUTH DAILY AT 9AM, Disp: 90 tablet, Rfl: 1   fluticasone (FLONASE) 50 MCG/ACT nasal spray, Place 2 sprays into both nostrils daily as needed. USE 2 SPRAY(S) IN EACH NOSTRIL AT BEDTIME, Disp: 16 g, Rfl: 0   Fluticasone-Umeclidin-Vilant (TRELEGY ELLIPTA) 100-62.5-25 MCG/INH AEPB, Take 1 puff by mouth daily., Disp: 180 each, Rfl: 1   gabapentin (NEURONTIN) 300 MG capsule, TAKE TWO CAPSULES BY MOUTH TWICE DAILY @ 9AM & 5PM, Disp: 360 capsule, Rfl: 1   insulin degludec (TRESIBA FLEXTOUCH) 100 UNIT/ML FlexTouch Pen, INJECT 12-30 UNITS OF INSULIN INTO THE SKIN DAILY TO KEEP FASTING GLUCOSE BETWEEN 100-140, Disp: 15 mL, Rfl: 1   Insulin Pen Needle 31G X 8 MM MISC, 1 each by Does not apply route daily., Disp: 100 each, Rfl: 1   lidocaine-prilocaine (EMLA) cream, Apply 1 Application topically as needed. Apply to port and cover with saran wrap 1-2  hours prior to port access, Disp: 30 g, Rfl: 3   ondansetron (ZOFRAN) 4 MG tablet, Take 1 tablet (4 mg total) by mouth every 8 (eight) hours as needed. (Patient not taking: Reported on 06/19/2022), Disp: 20 tablet, Rfl: 0 No current facility-administered medications for this visit.  Facility-Administered Medications Ordered in Other Visits:    heparin lock flush 100 UNIT/ML injection, , , ,   Physical exam:  Vitals:   09/04/22 1025  BP: 133/75  Pulse: 83  Resp: 16  Temp: 98.2 F (36.8 C)  TempSrc: Oral  Weight: 169 lb 4.8  oz (76.8 kg)  Height: 5' 4"  (1.626 m)   Physical Exam Constitutional:      General: She is not in acute distress. Cardiovascular:     Rate and Rhythm: Normal rate and regular rhythm.     Heart sounds: Normal heart sounds.  Pulmonary:     Effort: Pulmonary effort is normal.     Breath sounds: Normal breath sounds.  Abdominal:     General: Bowel sounds are normal.     Palpations: Abdomen is soft.  Skin:    General: Skin is warm and dry.  Neurological:     Mental Status: She is alert and oriented to person, place, and time.         Latest Ref Rng & Units 09/04/2022    9:53 AM  CMP  Glucose 70 - 99 mg/dL 233   BUN 8 - 23 mg/dL 16   Creatinine 0.44 - 1.00 mg/dL 1.32   Sodium 135 - 145 mmol/L 136   Potassium 3.5 - 5.1 mmol/L 5.1   Chloride 98 - 111 mmol/L 114   CO2 22 - 32 mmol/L 22   Calcium 8.9 - 10.3 mg/dL 7.8   Total Protein 6.5 - 8.1 g/dL 7.6   Total Bilirubin 0.3 - 1.2 mg/dL 1.0   Alkaline Phos 38 - 126 U/L 243   AST 15 - 41 U/L 27   ALT 0 - 44 U/L 16       Latest Ref Rng & Units 09/04/2022    9:53 AM  CBC  WBC 4.0 - 10.5 K/uL 7.1   Hemoglobin 12.0 - 15.0 g/dL 10.2   Hematocrit 36.0 - 46.0 % 32.3   Platelets 150 - 400 K/uL 118     No images are attached to the encounter.  CT CHEST ABDOMEN PELVIS W CONTRAST  Result Date: 08/30/2022 CLINICAL DATA:  Intrahepatic cholangiocarcinoma restaging * Tracking Code: BO * EXAM: CT CHEST,  ABDOMEN, AND PELVIS WITH CONTRAST TECHNIQUE: Multidetector CT imaging of the chest, abdomen and pelvis was performed following the standard protocol during bolus administration of intravenous contrast. RADIATION DOSE REDUCTION: This exam was performed according to the departmental dose-optimization program which includes automated exposure control, adjustment of the mA and/or kV according to patient size and/or use of iterative reconstruction technique. CONTRAST:  146m OMNIPAQUE IOHEXOL 300 MG/ML SOLN, oral enteric contrast COMPARISON:  04/24/2022 FINDINGS: CT CHEST FINDINGS Cardiovascular: Right chest port catheter. Aortic atherosclerosis. Normal heart size. Left and right coronary artery calcifications. No pericardial effusion. Mediastinum/Nodes: No enlarged mediastinal, hilar, or axillary lymph nodes. Thyroid gland, trachea, and esophagus demonstrate no significant findings. Lungs/Pleura: Scarring of the medial right lower lobe overlying prominent thoracic disc osteophytes (series 3, image 100). No pleural effusion or pneumothorax. Musculoskeletal: No chest wall abnormality. No acute osseous findings. CT ABDOMEN PELVIS FINDINGS Hepatobiliary: Unchanged heterogeneously hypoenhancing mass of the right lobe of the liver centered in hepatic segment V, measuring 5.2 x 4.1 cm (series 2, image 56). There is again severe atrophy of the right lobe of the liver with segmental ductal dilatation seen in hepatic segments VII and VIII, as well as compensatory hypertrophy of the left lobe of the liver. Gallstones and or sludge in the gallbladder (series 2, image 69). The right portal vein is again occluded at its origin by mass. No gallbladder wall thickening or extrahepatic biliary ductal dilatation. Pancreas: Unremarkable. No pancreatic ductal dilatation or surrounding inflammatory changes. Spleen: Normal in size without significant abnormality. Adrenals/Urinary Tract: Adrenal glands are unremarkable. Kidneys are normal,  without renal calculi, solid lesion, or hydronephrosis. Bladder is unremarkable. Stomach/Bowel: Stomach is within normal limits. Diverticulum of the transverse portion of the duodenal. Appendix appears normal. No evidence of bowel wall thickening, distention, or inflammatory changes. Vascular/Lymphatic: Aortic atherosclerosis. Unchanged enlarged porta hepatis nodes, measuring up to 2.0 x 1.2 cm (series 2, image 62). No additional enlarged abdominal or pelvic lymph nodes. Reproductive: Status post hysterectomy. Other: No abdominal wall hernia or abnormality. Pelvic floor prolapse. Unchanged trace perihepatic ascites (series 2, image 55). Musculoskeletal: No acute osseous findings. IMPRESSION: 1. Unchanged heterogeneously hypoenhancing mass of the right lobe of the liver centered in hepatic segment V. There is again severe atrophy of the right lobe of the liver with segmental ductal dilatation as well as compensatory hypertrophy of the left lobe of the liver. 2. Unchanged enlarged porta hepatis nodes. 3. No evidence of new lymphadenopathy or metastatic disease in the chest, abdomen, or pelvis. 4. Unchanged trace perihepatic ascites. 5. Cholelithiasis. 6. Coronary artery disease. Aortic Atherosclerosis (ICD10-I70.0). Electronically Signed   By: Delanna Ahmadi M.D.   On: 08/30/2022 14:09     Assessment and plan- Patient is a 61 y.o. female With metastatic intrahepatic cholangiocarcinoma here for on treatment assessment prior to cycle 16-day 1 of chemotherapy  Counts okay to proceed with cycle 16-day 1 of carboplatin gemcitabine chemotherapy today.  She is receiving treatment week on week off for better tolerance.  I will see her back in 2 weeks for cycle 16-day 15 of treatment.  I have reviewed CT chest abdomen pelvis images independently.  Her recent scans from September 2023Showed stable evidence of liver mass and porta hepatic adenopathy.  No evidence of progressive disease.  Plan is to continue present treatment  until progression or toxicity.  AKI: Patient is a creatinine has been mostly around 1-1.1 but since the last 2 months it has been between 1.2-1.3.  I will likely drop carboplatin altogether if there is any further increase in her creatinine.   Visit Diagnosis 1. Encounter for antineoplastic chemotherapy   2. Intrahepatic cholangiocarcinoma ([place])      Dr. Randa Evens, MD, MPH [facility] at [facility] [phone] 09/04/2022 3:16 PM

## 2022-09-04 NOTE — Patient Instructions (Signed)
[facility] AT [place]  Discharge Instructions: Thank you for choosing [facility] to provide your oncology and hematology care.  If you have a lab appointment with the [place] [facility], please go directly to the [facility] and check in at the registration area.  Wear comfortable clothing and clothing appropriate for easy access to any Portacath or PICC line.   We strive to give you quality time with your provider. You may need to reschedule your appointment if you arrive late (15 or more minutes).  Arriving late affects you and other patients whose appointments are after yours.  Also, if you miss three or more appointments without notifying the office, you may be dismissed from the clinic at the provider's discretion.      For prescription refill requests, have your pharmacy contact our office and allow 72 hours for refills to be completed.    Today you received the following chemotherapy and/or immunotherapy agents: Gemzar, carboplatin       To help prevent nausea and vomiting after your treatment, we encourage you to take your nausea medication as directed.  BELOW ARE SYMPTOMS THAT SHOULD BE REPORTED IMMEDIATELY: *FEVER GREATER THAN 100.4 F (38 C) OR HIGHER *CHILLS OR SWEATING *NAUSEA AND VOMITING THAT IS NOT CONTROLLED WITH YOUR NAUSEA MEDICATION *UNUSUAL SHORTNESS OF BREATH *UNUSUAL BRUISING OR BLEEDING *URINARY PROBLEMS (pain or burning when urinating, or frequent urination) *BOWEL PROBLEMS (unusual diarrhea, constipation, pain near the anus) TENDERNESS IN MOUTH AND THROAT WITH OR WITHOUT PRESENCE OF ULCERS (sore throat, sores in mouth, or a toothache) UNUSUAL RASH, SWELLING OR PAIN  UNUSUAL VAGINAL DISCHARGE OR ITCHING   Items with * indicate a potential emergency and should be followed up as soon as possible or go to the Emergency Department if any problems should occur.  Please show the CHEMOTHERAPY ALERT CARD or IMMUNOTHERAPY ALERT CARD at  check-in to the Emergency Department and triage nurse.  Should you have questions after your visit or need to cancel or reschedule your appointment, please contact [facility] [place] AT [place]  [phone] and follow the prompts.  Office hours are 8:00 a.m. to 4:30 p.m. Monday - Friday. Please note that voicemails left after 4:00 p.m. may not be returned until the following business day.  We are closed weekends and major holidays. You have access to a nurse at all times for urgent questions. Please call the main number to the clinic [phone] and follow the prompts.  For any non-urgent questions, you may also contact your provider using MyChart. We now offer e-Visits for anyone 76 and older to request care online for non-urgent symptoms. For details visit [email or web address].   Also download the MyChart app! Go to the app store, search "MyChart", open the app, select [facility], and log in with your MyChart username and password.  Masks are optional in the cancer centers. If you would like for your care team to wear a mask while they are taking care of you, please let them know. For doctor visits, patients may have with them one support person who is at least 63 years old. At this time, visitors are not allowed in the infusion area.

## 2022-09-05 LAB — CANCER ANTIGEN 19-9: CA 19-9: <2 U/mL (ref 0–35)

## 2022-09-07 ENCOUNTER — Telehealth: Payer: Self-pay | Admitting: *Deleted

## 2022-09-07 NOTE — Telephone Encounter (Signed)
I called the pt and let her know that there is 200 dollars left in the fund. If she can bring the bill and give it to sabrina and she will send it to [place]. Pt understands and will get to her.

## 2022-09-08 ENCOUNTER — Other Ambulatory Visit: Payer: Self-pay | Admitting: Family Medicine

## 2022-09-08 DIAGNOSIS — J4489 Other specified chronic obstructive pulmonary disease: Secondary | ICD-10-CM

## 2022-09-11 ENCOUNTER — Inpatient Hospital Stay: Payer: Medicare Other | Admitting: Hospice and Palliative Medicine

## 2022-09-14 ENCOUNTER — Inpatient Hospital Stay: Payer: Medicare Other | Attending: Nurse Practitioner | Admitting: Hospice and Palliative Medicine

## 2022-09-14 DIAGNOSIS — F17210 Nicotine dependence, cigarettes, uncomplicated: Secondary | ICD-10-CM | POA: Insufficient documentation

## 2022-09-14 DIAGNOSIS — E119 Type 2 diabetes mellitus without complications: Secondary | ICD-10-CM | POA: Insufficient documentation

## 2022-09-14 DIAGNOSIS — Z90710 Acquired absence of both cervix and uterus: Secondary | ICD-10-CM | POA: Insufficient documentation

## 2022-09-14 DIAGNOSIS — Z5111 Encounter for antineoplastic chemotherapy: Secondary | ICD-10-CM | POA: Insufficient documentation

## 2022-09-14 DIAGNOSIS — I10 Essential (primary) hypertension: Secondary | ICD-10-CM | POA: Insufficient documentation

## 2022-09-14 DIAGNOSIS — C221 Intrahepatic bile duct carcinoma: Secondary | ICD-10-CM | POA: Insufficient documentation

## 2022-09-14 DIAGNOSIS — D6481 Anemia due to antineoplastic chemotherapy: Secondary | ICD-10-CM | POA: Insufficient documentation

## 2022-09-14 NOTE — Progress Notes (Signed)
Virtual Visit via Telephone Note  I connected with [name] on 09/14/22 at 10:30 AM EDT by telephone and verified that I am speaking with the correct person using two identifiers.  Location: Patient: Home Provider: Clinic   I discussed the limitations, risks, security and privacy concerns of performing an evaluation and management service by telephone and the availability of in person appointments. I also discussed with the patient that there may be a patient responsible charge related to this service. The patient expressed understanding and agreed to proceed.   History of Present Illness: ERCIE ELIASEN is a 61 y.o. female with multiple medical problems including hypertension, hyperlipidemia, and type 2 diabetes, and stage IV cholangiocarcinoma.  Patient is on treatment with systemic chemotherapy. She was referred to palliative care to help address goals.    Observations/Objective: I called and spoke with patient by phone.  She reports she is doing reasonably well.  She denies significant symptomatic concerns at present.  She does endorse some chronic arthritis and has been using Voltaren cream with good effect.  Appetite is reportedly adequate, as is performance status.  Patient does endorse some financial distress and states that she has had some difficulty paying her electricity bill.  We will send referral to social work.  Assessment and Plan: Stage IV cholangiocarcinoma -on systemic chemotherapy.  Seems to be doing well clinically without any significant symptomatic burden at present.  Will follow.  Financial distress -referral to social work  Follow Up Instructions: Follow-up telephone visit 2 to 3 months   I discussed the assessment and treatment plan with the patient. The patient was provided an opportunity to ask questions and all were answered. The patient agreed with the plan and demonstrated an understanding of the instructions.   The patient was advised to call back or  seek an in-person evaluation if the symptoms worsen or if the condition fails to improve as anticipated.  I provided 5 minutes of non-face-to-face time during this encounter.   Irean Hong, NP

## 2022-09-16 ENCOUNTER — Other Ambulatory Visit: Payer: Self-pay | Admitting: Oncology

## 2022-09-16 ENCOUNTER — Other Ambulatory Visit: Payer: Self-pay | Admitting: Family Medicine

## 2022-09-16 ENCOUNTER — Encounter: Payer: Self-pay | Admitting: Licensed Clinical Social Worker

## 2022-09-16 DIAGNOSIS — G8929 Other chronic pain: Secondary | ICD-10-CM

## 2022-09-16 DIAGNOSIS — F331 Major depressive disorder, recurrent, moderate: Secondary | ICD-10-CM

## 2022-09-16 DIAGNOSIS — E1169 Type 2 diabetes mellitus with other specified complication: Secondary | ICD-10-CM

## 2022-09-16 DIAGNOSIS — E1142 Type 2 diabetes mellitus with diabetic polyneuropathy: Secondary | ICD-10-CM

## 2022-09-16 DIAGNOSIS — M4807 Spinal stenosis, lumbosacral region: Secondary | ICD-10-CM

## 2022-09-16 DIAGNOSIS — C221 Intrahepatic bile duct carcinoma: Secondary | ICD-10-CM

## 2022-09-16 NOTE — Progress Notes (Signed)
[place] Work  Clinical Social Work was referred by medical provider for assessment of psychosocial needs.  Clinical Social Worker attempted to contact patient by phone  to offer support and assess for needs.  CSW called patient's home and cell phone and was unable to leave a voicemail in either of them    ([place])   Adelene Amas, [place]        Patient is participating in a Managed Medicaid Plan:  Yes

## 2022-09-16 NOTE — Telephone Encounter (Signed)
Appt made

## 2022-09-17 MED FILL — Dexamethasone Sodium Phosphate Inj 100 MG/10ML: INTRAMUSCULAR | Qty: 1 | Status: AC

## 2022-09-18 ENCOUNTER — Encounter: Payer: Self-pay | Admitting: Oncology

## 2022-09-18 ENCOUNTER — Inpatient Hospital Stay: Payer: Medicare Other

## 2022-09-18 ENCOUNTER — Ambulatory Visit: Payer: Medicare Other

## 2022-09-18 ENCOUNTER — Inpatient Hospital Stay (HOSPITAL_BASED_OUTPATIENT_CLINIC_OR_DEPARTMENT_OTHER): Payer: Medicare Other | Admitting: Oncology

## 2022-09-18 ENCOUNTER — Other Ambulatory Visit: Payer: Medicare Other

## 2022-09-18 VITALS — BP 127/73 | HR 79 | Temp 96.7°F | Resp 16 | Wt 169.0 lb

## 2022-09-18 DIAGNOSIS — T451X5A Adverse effect of antineoplastic and immunosuppressive drugs, initial encounter: Secondary | ICD-10-CM

## 2022-09-18 DIAGNOSIS — Z90710 Acquired absence of both cervix and uterus: Secondary | ICD-10-CM | POA: Diagnosis not present

## 2022-09-18 DIAGNOSIS — I10 Essential (primary) hypertension: Secondary | ICD-10-CM | POA: Diagnosis not present

## 2022-09-18 DIAGNOSIS — E119 Type 2 diabetes mellitus without complications: Secondary | ICD-10-CM

## 2022-09-18 DIAGNOSIS — D6481 Anemia due to antineoplastic chemotherapy: Secondary | ICD-10-CM | POA: Diagnosis not present

## 2022-09-18 DIAGNOSIS — C221 Intrahepatic bile duct carcinoma: Secondary | ICD-10-CM | POA: Diagnosis present

## 2022-09-18 DIAGNOSIS — Z5111 Encounter for antineoplastic chemotherapy: Secondary | ICD-10-CM

## 2022-09-18 DIAGNOSIS — F17210 Nicotine dependence, cigarettes, uncomplicated: Secondary | ICD-10-CM | POA: Diagnosis not present

## 2022-09-18 LAB — CBC WITH DIFFERENTIAL/PLATELET
Abs Immature Granulocytes: 0.02 10*3/uL (ref 0.00–0.07)
Basophils Absolute: 0 10*3/uL (ref 0.0–0.1)
Basophils Relative: 1 %
Eosinophils Absolute: 0.2 10*3/uL (ref 0.0–0.5)
Eosinophils Relative: 3 %
HCT: 32.4 % — ABNORMAL LOW (ref 36.0–46.0)
Hemoglobin: 10.3 g/dL — ABNORMAL LOW (ref 12.0–15.0)
Immature Granulocytes: 0 %
Lymphocytes Relative: 24 %
Lymphs Abs: 1.8 10*3/uL (ref 0.7–4.0)
MCH: 31.7 pg (ref 26.0–34.0)
MCHC: 31.8 g/dL (ref 30.0–36.0)
MCV: 99.7 fL (ref 80.0–100.0)
Monocytes Absolute: 0.8 10*3/uL (ref 0.1–1.0)
Monocytes Relative: 12 %
Neutro Abs: 4.3 10*3/uL (ref 1.7–7.7)
Neutrophils Relative %: 60 %
Platelets: 108 10*3/uL — ABNORMAL LOW (ref 150–400)
RBC: 3.25 MIL/uL — ABNORMAL LOW (ref 3.87–5.11)
RDW: 19.9 % — ABNORMAL HIGH (ref 11.5–15.5)
WBC: 7.2 10*3/uL (ref 4.0–10.5)
nRBC: 0 % (ref 0.0–0.2)

## 2022-09-18 LAB — COMPREHENSIVE METABOLIC PANEL
ALT: 19 U/L (ref 0–44)
AST: 28 U/L (ref 15–41)
Albumin: 2.7 g/dL — ABNORMAL LOW (ref 3.5–5.0)
Alkaline Phosphatase: 258 U/L — ABNORMAL HIGH (ref 38–126)
Anion gap: 3 — ABNORMAL LOW (ref 5–15)
BUN: 14 mg/dL (ref 8–23)
CO2: 22 mmol/L (ref 22–32)
Calcium: 8 mg/dL — ABNORMAL LOW (ref 8.9–10.3)
Chloride: 114 mmol/L — ABNORMAL HIGH (ref 98–111)
Creatinine, Ser: 1.17 mg/dL — ABNORMAL HIGH (ref 0.44–1.00)
GFR, Estimated: 52 mL/min — ABNORMAL LOW (ref >60)
Glucose, Bld: 141 mg/dL — ABNORMAL HIGH (ref 70–99)
Potassium: 5.3 mmol/L — ABNORMAL HIGH (ref 3.5–5.1)
Sodium: 139 mmol/L (ref 135–145)
Total Bilirubin: 0.9 mg/dL (ref 0.3–1.2)
Total Protein: 7.7 g/dL (ref 6.5–8.1)

## 2022-09-18 MED ORDER — HEPARIN SOD (PORK) LOCK FLUSH 100 UNIT/ML IV SOLN
500.0000 [IU] | Freq: Once | INTRAVENOUS | Status: AC | PRN
  Administered 2022-09-18: 500 [IU]
  Filled 2022-09-18: qty 5

## 2022-09-18 MED ORDER — SODIUM CHLORIDE 0.9 % IV SOLN
10.0000 mg | Freq: Once | INTRAVENOUS | Status: AC
  Administered 2022-09-18: 10 mg via INTRAVENOUS
  Filled 2022-09-18: qty 10

## 2022-09-18 MED ORDER — FAMOTIDINE IN NACL 20-0.9 MG/50ML-% IV SOLN
20.0000 mg | Freq: Once | INTRAVENOUS | Status: AC
  Administered 2022-09-18: 20 mg via INTRAVENOUS
  Filled 2022-09-18: qty 50

## 2022-09-18 MED ORDER — SODIUM CHLORIDE 0.9 % IV SOLN
124.0500 mg | Freq: Once | INTRAVENOUS | Status: AC
  Administered 2022-09-18: 120 mg via INTRAVENOUS
  Filled 2022-09-18: qty 12

## 2022-09-18 MED ORDER — PALONOSETRON HCL INJECTION 0.25 MG/5ML
0.2500 mg | Freq: Once | INTRAVENOUS | Status: AC
  Administered 2022-09-18: 0.25 mg via INTRAVENOUS
  Filled 2022-09-18: qty 5

## 2022-09-18 MED ORDER — HEPARIN SOD (PORK) LOCK FLUSH 100 UNIT/ML IV SOLN
INTRAVENOUS | Status: AC
  Filled 2022-09-18: qty 5

## 2022-09-18 MED ORDER — SODIUM CHLORIDE 0.9 % IV SOLN
1400.0000 mg | Freq: Once | INTRAVENOUS | Status: AC
  Administered 2022-09-18: 1400 mg via INTRAVENOUS
  Filled 2022-09-18: qty 26.3

## 2022-09-18 MED ORDER — DIPHENHYDRAMINE HCL 50 MG/ML IJ SOLN
25.0000 mg | Freq: Once | INTRAMUSCULAR | Status: AC
  Administered 2022-09-18: 25 mg via INTRAVENOUS
  Filled 2022-09-18: qty 1

## 2022-09-18 MED ORDER — SODIUM CHLORIDE 0.9 % IV SOLN
Freq: Once | INTRAVENOUS | Status: AC
  Filled 2022-09-18: qty 250

## 2022-09-18 NOTE — Progress Notes (Signed)
Hematology/Oncology Consult note [facility]  Telephone:([phone] [phone]  Patient Care Team: Alba Cory, MD as PCP - General (Family Medicine) Benita Gutter, RN as Oncology Nurse Navigator Gaspar Cola, [facility] (Pharmacist) Creig Hines, MD as Consulting Physician (Oncology) Borders, Daryl Eastern, NP as Nurse Practitioner [facility] and Palliative Medicine)   Name of the patient: Janice Short  [id number]  [birth year]   Date of visit: 09/18/22  Diagnosis- metastatic intrahepatic cholangiocarcinoma  Chief complaint/ Reason for visit-on treatment assessment prior to cycle 17-day 1 of carboplatin gemcitabine chemotherapy  Heme/Onc history: Patient is a 61 year old African-American female with a past medical history significant for hypertension hyperlipidemia and type 2 diabetes who presented to the ER with symptoms of cramping abdominal pain and nausea.  This was followed by right upper quadrant ultrasound which showed multiple echogenic masses in the liver with nodular contour of the liver possibly suggestive of cirrhosis.  Several nodular masses in the vicinity of the pancreatic head and porta hepatic lymph nodes.  This was followed by an MRI abdomen and MRCP.  It showed right hepatic lobe masslike area measuring 4.9 x 2.9 cm.  Diffusion abnormality in the left hepatic lobe with mild left-sided biliary ductal dilatation and another area of abnormality measuring 1.4 x 1 cm.  Bulky celiac adenopathy 3 x 2.1 cm tracking into gastrohepatic recess.  Suspected portal venous invasion with convex protrusion in the right portal vein.  Left adrenal lesion measuring 2.9 x 1.5 cm.  Findings concerning for cholangiocarcinoma or biphenotypic cholangiole hepatocellular neoplasm.     Patient underwent CT-guided liver biopsy Which was consistent with adenocarcinoma poorly differentiated.  Immunohistochemistry showed CK7 positivity, CDX2 negative, Heppar negative,  GATA3 negative.  CK19 positive.  Differentials include cholangiocarcinoma, pancreatic, upper GI.  Lung and breast less likely.  However given CK19 positivity most compatible with cholangiocarcinoma.   Patient's case was discussed at tumor board and it was felt as if the lymphadenopathy was more like a confluent primary tumor mass.  Patient was referred to [facility] for second opinion to see if she would be a candidate for surgery.However upon their review of the PET scan it was deemed that this was indeed periportal adenopathy.  They also did a CT chest abdomen and pelvis with MIPS protocol and she was also found to have portocaval lymph node measuring 2.4 cm which was more consistent with metastatic disease.  She was deemed unresectable   NGS testing showed BRCA2, FGFR2, PIK3CA, RAD 54L, CDC73, CDKN2 A/B CR EBBP rearrangement exon 31    Patient has been getting gemcitabine cisplatin chemotherapy 2 weeks on 1 week off since August 2022.  Patient had an allergic reaction to cisplatin and therefore was switched to carboplatin gemcitabine chemotherapy which she gets week on week off    Interval history-tolerating treatments well so far and denies any complaints today.  Denies any nausea vomiting or diarrhea  ECOG PS- 1 Pain scale- 0   Review of systems- Review of Systems  Constitutional:  Negative for chills, fever, malaise/fatigue and weight loss.  HENT:  Negative for congestion, ear discharge and nosebleeds.   Eyes:  Negative for blurred vision.  Respiratory:  Negative for cough, hemoptysis, sputum production, shortness of breath and wheezing.   Cardiovascular:  Negative for chest pain, palpitations, orthopnea and claudication.  Gastrointestinal:  Negative for abdominal pain, blood in stool, constipation, diarrhea, heartburn, melena, nausea and vomiting.  Genitourinary:  Negative for dysuria, flank pain, frequency, hematuria and urgency.  Musculoskeletal:  Negative for back pain, joint pain and  myalgias.  Skin:  Negative for rash.  Neurological:  Negative for dizziness, tingling, focal weakness, seizures, weakness and headaches.  Endo/Heme/Allergies:  Does not bruise/bleed easily.  Psychiatric/Behavioral:  Negative for depression and suicidal ideas. The patient does not have insomnia.       Allergies  Allergen Reactions   Other Shortness Of Breath    Cat dander and grass pollen   Cisplatin Itching   Bydureon [Exenatide] Other (See Comments)    Pain with injection not allergy     Past Medical History:  Diagnosis Date   Allergy    Asthma    Carpal tunnel syndrome on left    Cervical radiculitis    Chronic osteoarthritis    Corns and callosity    Depression    Dermatophytosis of foot    Diabetes mellitus without complication (HCC)    Gout    Hyperlipidemia    Hypertension    Impingement syndrome of left shoulder    Intrahepatic cholangiocarcinoma (HCC)    Lumbosacral neuritis    Microalbuminuria    Obesity    Supraventricular tachycardia 07/01/2015   SVT (supraventricular tachycardia)    Tenosynovitis of wrist    Vitamin D deficiency      Past Surgical History:  Procedure Laterality Date   ABDOMINAL HYSTERECTOMY     COLONOSCOPY WITH PROPOFOL N/A 05/06/2021   Procedure: COLONOSCOPY WITH PROPOFOL;  Surgeon: Lucilla Lame, MD;  Location: [facility] ENDOSCOPY;  Service: Endoscopy;  Laterality: N/A;   ESOPHAGOGASTRODUODENOSCOPY (EGD) WITH PROPOFOL N/A 05/06/2021   Procedure: ESOPHAGOGASTRODUODENOSCOPY (EGD) WITH PROPOFOL;  Surgeon: Lucilla Lame, MD;  Location: [facility] ENDOSCOPY;  Service: Endoscopy;  Laterality: N/A;   PORTA CATH INSERTION N/A 07/17/2021   Procedure: PORTA CATH INSERTION;  Surgeon: Algernon Huxley, MD;  Location: [facility];  Service: Cardiovascular;  Laterality: N/A;    Social History   Socioeconomic History   Marital status: Single    Spouse name: Not on file   Number of children: 2   Years of education: Not on file   Highest education  level: Not on file  Occupational History   Occupation: disable     Comment: from chronic back pain, 2019  Tobacco Use   Smoking status: Some Days    Types: Cigarettes   Smokeless tobacco: Never   Tobacco comments:    1 per day 01/30/21 does not inhale it  Vaping Use   Vaping Use: Never used  Substance and Sexual Activity   Alcohol use: Not Currently   Drug use: Not Currently    Types: Benzodiazepines   Sexual activity: Not Currently  Other Topics Concern   Not on file  Social History Narrative   She is on disability for chronic back pain. Pt's son lives with her   Social Determinants of Health   Financial Resource Strain: Medium Risk (02/03/2022)   Overall Financial Resource Strain (CARDIA)    Difficulty of Paying Living Expenses: Somewhat hard  Food Insecurity: Food Insecurity Present (02/03/2022)   Hunger Vital Sign    Worried About Running Out of Food in the Last Year: Sometimes true    Ran Out of Food in the Last Year: Never true  Transportation Needs: No Transportation Needs (04/24/2022)   PRAPARE - Hydrologist (Medical): No    Lack of Transportation (Non-Medical): No  Physical Activity: Inactive (02/03/2022)   Exercise Vital Sign    Days of Exercise per  Week: 0 days    Minutes of Exercise per Session: 0 min  Stress: No Stress Concern Present (02/03/2022)   [place]    Feeling of Stress : Only a little  Social Connections: Moderately Isolated (02/03/2022)   Social Connection and Isolation Panel [NHANES]    Frequency of Communication with Friends and Family: More than three times a week    Frequency of Social Gatherings with Friends and Family: Three times a week    Attends Religious Services: More than 4 times per year    Active Member of Clubs or Organizations: No    Attends Archivist Meetings: Never    Marital Status: Never married  Intimate Partner Violence:  Not At Risk (02/03/2022)   Humiliation, Afraid, Rape, and Kick questionnaire    Fear of Current or Ex-Partner: No    Emotionally Abused: No    Physically Abused: No    Sexually Abused: No    Family History  Problem Relation Age of Onset   Heart attack Mother    CAD Mother    Kidney disease Father      Current Outpatient Medications:    albuterol (VENTOLIN HFA) 108 (90 Base) MCG/ACT inhaler, INHALE TWO PUFFS BY MOUTH EVERY 6 HOURS AS NEEDED FOR WHEEZING AND FOR SHORTNESS OF BREATH (BULK), Disp: 18 g, Rfl: 0   aspirin 81 MG tablet, Take 1 tablet by mouth daily., Disp: , Rfl:    atorvastatin (LIPITOR) 40 MG tablet, TAKE ONE TABLET BY MOUTH DAILY AT 9PM AT BEDTIME, Disp: 90 tablet, Rfl: 11   blood glucose meter kit and supplies, Dispense based on patient and insurance preference. Use up to four times daily as directed. (FOR ICD-10 E10.9, E11.9)., Disp: 1 each, Rfl: 0   diphenhydramine-acetaminophen (TYLENOL PM) 25-500 MG TABS tablet, Take 1 tablet by mouth at bedtime as needed., Disp: , Rfl:    escitalopram (LEXAPRO) 20 MG tablet, TAKE ONE TABLET BY MOUTH DAILY AT 9AM, Disp: 90 tablet, Rfl: 1   fluticasone (FLONASE) 50 MCG/ACT nasal spray, Place 2 sprays into both nostrils daily as needed. USE 2 SPRAY(S) IN EACH NOSTRIL AT BEDTIME, Disp: 16 g, Rfl: 0   Fluticasone-Umeclidin-Vilant (TRELEGY ELLIPTA) 100-62.5-25 MCG/INH AEPB, Take 1 puff by mouth daily., Disp: 180 each, Rfl: 1   gabapentin (NEURONTIN) 300 MG capsule, TAKE TWO CAPSULES BY MOUTH TWICE DAILY @ 9AM & 5PM, Disp: 360 capsule, Rfl: 1   insulin degludec (TRESIBA FLEXTOUCH) 100 UNIT/ML FlexTouch Pen, INJECT 12-30 UNITS OF INSULIN INTO THE SKIN DAILY TO KEEP FASTING GLUCOSE BETWEEN 100-140, Disp: 15 mL, Rfl: 1   Insulin Pen Needle 31G X 8 MM MISC, 1 each by Does not apply route daily., Disp: 100 each, Rfl: 1   lidocaine-prilocaine (EMLA) cream, Apply 1 Application topically as needed. Apply to port and cover with saran wrap 1-2 hours  prior to port access, Disp: 30 g, Rfl: 3   ondansetron (ZOFRAN) 4 MG tablet, Take 1 tablet (4 mg total) by mouth every 8 (eight) hours as needed. (Patient not taking: Reported on 06/19/2022), Disp: 20 tablet, Rfl: 0 No current facility-administered medications for this visit.  Facility-Administered Medications Ordered in Other Visits:    heparin lock flush 100 UNIT/ML injection, , , ,    heparin lock flush 100 UNIT/ML injection, , , ,   Physical exam:  Vitals:   09/18/22 0908  BP: 127/73  Pulse: 79  Resp: 16  Temp: (!) 96.7 F (35.9  C)  SpO2: 100%  Weight: 169 lb (76.7 kg)   Physical Exam Constitutional:      General: She is not in acute distress. Cardiovascular:     Rate and Rhythm: Normal rate and regular rhythm.     Heart sounds: Normal heart sounds.  Pulmonary:     Effort: Pulmonary effort is normal.     Breath sounds: Normal breath sounds.  Abdominal:     General: Bowel sounds are normal.     Palpations: Abdomen is soft.  Skin:    General: Skin is warm and dry.  Neurological:     Mental Status: She is alert and oriented to person, place, and time.         Latest Ref Rng & Units 09/18/2022    8:51 AM  CMP  Glucose 70 - 99 mg/dL 141   BUN 8 - 23 mg/dL 14   Creatinine 0.44 - 1.00 mg/dL 1.17   Sodium 135 - 145 mmol/L 139   Potassium 3.5 - 5.1 mmol/L 5.3   Chloride 98 - 111 mmol/L 114   CO2 22 - 32 mmol/L 22   Calcium 8.9 - 10.3 mg/dL 8.0   Total Protein 6.5 - 8.1 g/dL 7.7   Total Bilirubin 0.3 - 1.2 mg/dL 0.9   Alkaline Phos 38 - 126 U/L 258   AST 15 - 41 U/L 28   ALT 0 - 44 U/L 19       Latest Ref Rng & Units 09/18/2022    8:51 AM  CBC  WBC 4.0 - 10.5 K/uL 7.2   Hemoglobin 12.0 - 15.0 g/dL 10.3   Hematocrit 36.0 - 46.0 % 32.4   Platelets 150 - 400 K/uL 108     No images are attached to the encounter.  CT CHEST ABDOMEN PELVIS W CONTRAST  Result Date: 08/30/2022 CLINICAL DATA:  Intrahepatic cholangiocarcinoma restaging * Tracking Code: BO * EXAM:  CT CHEST, ABDOMEN, AND PELVIS WITH CONTRAST TECHNIQUE: Multidetector CT imaging of the chest, abdomen and pelvis was performed following the standard protocol during bolus administration of intravenous contrast. RADIATION DOSE REDUCTION: This exam was performed according to the departmental dose-optimization program which includes automated exposure control, adjustment of the mA and/or kV according to patient size and/or use of iterative reconstruction technique. CONTRAST:  191mL OMNIPAQUE IOHEXOL 300 MG/ML SOLN, oral enteric contrast COMPARISON:  04/24/2022 FINDINGS: CT CHEST FINDINGS Cardiovascular: Right chest port catheter. Aortic atherosclerosis. Normal heart size. Left and right coronary artery calcifications. No pericardial effusion. Mediastinum/Nodes: No enlarged mediastinal, hilar, or axillary lymph nodes. Thyroid gland, trachea, and esophagus demonstrate no significant findings. Lungs/Pleura: Scarring of the medial right lower lobe overlying prominent thoracic disc osteophytes (series 3, image 100). No pleural effusion or pneumothorax. Musculoskeletal: No chest wall abnormality. No acute osseous findings. CT ABDOMEN PELVIS FINDINGS Hepatobiliary: Unchanged heterogeneously hypoenhancing mass of the right lobe of the liver centered in hepatic segment V, measuring 5.2 x 4.1 cm (series 2, image 56). There is again severe atrophy of the right lobe of the liver with segmental ductal dilatation seen in hepatic segments VII and VIII, as well as compensatory hypertrophy of the left lobe of the liver. Gallstones and or sludge in the gallbladder (series 2, image 69). The right portal vein is again occluded at its origin by mass. No gallbladder wall thickening or extrahepatic biliary ductal dilatation. Pancreas: Unremarkable. No pancreatic ductal dilatation or surrounding inflammatory changes. Spleen: Normal in size without significant abnormality. Adrenals/Urinary Tract: Adrenal glands are unremarkable. Kidneys are  normal, without renal calculi, solid lesion, or hydronephrosis. Bladder is unremarkable. Stomach/Bowel: Stomach is within normal limits. Diverticulum of the transverse portion of the duodenal. Appendix appears normal. No evidence of bowel wall thickening, distention, or inflammatory changes. Vascular/Lymphatic: Aortic atherosclerosis. Unchanged enlarged porta hepatis nodes, measuring up to 2.0 x 1.2 cm (series 2, image 62). No additional enlarged abdominal or pelvic lymph nodes. Reproductive: Status post hysterectomy. Other: No abdominal wall hernia or abnormality. Pelvic floor prolapse. Unchanged trace perihepatic ascites (series 2, image 55). Musculoskeletal: No acute osseous findings. IMPRESSION: 1. Unchanged heterogeneously hypoenhancing mass of the right lobe of the liver centered in hepatic segment V. There is again severe atrophy of the right lobe of the liver with segmental ductal dilatation as well as compensatory hypertrophy of the left lobe of the liver. 2. Unchanged enlarged porta hepatis nodes. 3. No evidence of new lymphadenopathy or metastatic disease in the chest, abdomen, or pelvis. 4. Unchanged trace perihepatic ascites. 5. Cholelithiasis. 6. Coronary artery disease. Aortic Atherosclerosis (ICD10-I70.0). Electronically Signed   By: Delanna Ahmadi M.D.   On: 08/30/2022 14:09     Assessment and plan- Patient is a 61 y.o. female with metastatic intrahepatic cholangiocarcinoma here for on treatment assessment prior to cycle 17-day 1 of gemcitabine carboplatin chemotherapy  Renal functions have improved and we will continue with carboplatin gemcitabine chemotherapy every 2 weeks until progression or toxicity.  Recent scans did not show any evidence of progressive disease.  Patient has baseline chemo-induced anemia which has remained overall stable.  Continue to monitor   Visit Diagnosis 1. Intrahepatic cholangiocarcinoma ([place])   2. Encounter for antineoplastic chemotherapy   3. Anemia due to  antineoplastic chemotherapy      Dr. Randa Evens, MD, MPH [facility] [phone] 09/18/2022 3:11 PM

## 2022-09-18 NOTE — Patient Instructions (Signed)
[facility] CANCER CTR AT [place]  Discharge Instructions: Thank you for choosing [facility] to provide your oncology and hematology care.  If you have a lab appointment with the [facility], please go directly to the [facility] and check in at the registration area.  Wear comfortable clothing and clothing appropriate for easy access to any Portacath or PICC line.   We strive to give you quality time with your provider. You may need to reschedule your appointment if you arrive late (15 or more minutes).  Arriving late affects you and other patients whose appointments are after yours.  Also, if you miss three or more appointments without notifying the office, you may be dismissed from the clinic at the provider's discretion.      For prescription refill requests, have your pharmacy contact our office and allow 72 hours for refills to be completed.    Today you received the following chemotherapy and/or immunotherapy agents Gemzar and Carboplatin.      To help prevent nausea and vomiting after your treatment, we encourage you to take your nausea medication as directed.  BELOW ARE SYMPTOMS THAT SHOULD BE REPORTED IMMEDIATELY: *FEVER GREATER THAN 100.4 F (38 C) OR HIGHER *CHILLS OR SWEATING *NAUSEA AND VOMITING THAT IS NOT CONTROLLED WITH YOUR NAUSEA MEDICATION *UNUSUAL SHORTNESS OF BREATH *UNUSUAL BRUISING OR BLEEDING *URINARY PROBLEMS (pain or burning when urinating, or frequent urination) *BOWEL PROBLEMS (unusual diarrhea, constipation, pain near the anus) TENDERNESS IN MOUTH AND THROAT WITH OR WITHOUT PRESENCE OF ULCERS (sore throat, sores in mouth, or a toothache) UNUSUAL RASH, SWELLING OR PAIN  UNUSUAL VAGINAL DISCHARGE OR ITCHING   Items with * indicate a potential emergency and should be followed up as soon as possible or go to the Emergency Department if any problems should occur.  Please show the CHEMOTHERAPY ALERT CARD or IMMUNOTHERAPY ALERT CARD at  check-in to the Emergency Department and triage nurse.  Should you have questions after your visit or need to cancel or reschedule your appointment, please contact [facility] CANCER [place] AT [place]  [phone] and follow the prompts.  Office hours are 8:00 a.m. to 4:30 p.m. Monday - Friday. Please note that voicemails left after 4:00 p.m. may not be returned until the following business day.  We are closed weekends and major holidays. You have access to a nurse at all times for urgent questions. Please call the main number to the clinic [phone] and follow the prompts.  For any non-urgent questions, you may also contact your provider using MyChart. We now offer e-Visits for anyone 51 and older to request care online for non-urgent symptoms. For details visit [email or web address].   Also download the MyChart app! Go to the app store, search "MyChart", open the app, select [facility], and log in with your MyChart username and password.  Masks are optional in the cancer centers. If you would like for your care team to wear a mask while they are taking care of you, please let them know. For doctor visits, patients may have with them one support person who is at least 63 years old. At this time, visitors are not allowed in the infusion area.

## 2022-09-18 NOTE — Progress Notes (Signed)
Pt will like to discuss her insulin needles due to causing bruising in abdomen area; states she has an appt with Dr. Ancil Boozer next Friday. I advised pt  to reach out to Dr. Ancil Boozer office and see if there was something that can be done prior to Friday.

## 2022-09-23 NOTE — Progress Notes (Signed)
Name: Janice Short   MRN: [id number]    DOB: [birth year]   Date:09/25/2022       Progress Note  Subjective  Chief Complaint  Follow up/ Medication Refill  HPI  DMII: she is off Xultophy and Synjardi, only on Tresiba 38 units, today A1C is still low, offered to stop insulin but she does not want to take oral medication,  advised to go down to 20 units daily, currently taking 30 units She is still losing weight due to cancer. She has associated microalbuminuria,  dyslipidemia, peripheral neuropathy. . Denies polyphagia, polydipsia or polyuria. She has been compliant with statin therapy and gabapentin, no longer needs bp medication due to weight loss - bp is at goal   Adenocarcinoma poorly differentiated ( cholangiocarcinoma) with mets :  she had lost 44 lbs from 2021 to 2022  she was not trying when she was seen in our office back in January 22 we had discussed colonoscopy , lung CT and look for cancer but she chose a fit test and asked to hold off on further testing. She went to [facility] on 03/2021 with nausea/abdominal cramping  and was diagnosed with intrahepatic cholangiocarcinoma. She is seeing Dr. Janese Banks ( oncologist ) , she had a PET scan done 04/16/2021, she had a visit with surgeon and was not advisable to have surgery. She is under the care of oncologist. She states appetite is a little better, weight is still trending down but states no nausea or abdominal pain, denies diarrhea.    MDD: she has a long history of depression, periods of crying spell, lack of motivation, she was very stressed when first diagnosed with cancer, but has been stable now, takes Lexapro daily, lives with daughter and grandson   Chronic Bronchitis: still smokes cigarettes but down to a few cigarettes a day, used to be a heavy smoker,  she states cough has improved very seldom has wheezing,  she denies SOB with activity . She states she has been using Trelegy and still has medication at home     Patient Active Problem List    Diagnosis Date Noted   Moderate protein-calorie malnutrition ([place]) 09/25/2022   Simple chronic bronchitis ([place]) 09/25/2022   Tobacco use 09/25/2022   Rectal polyp    Polyp of sigmoid colon    Polyp of duodenum    Intrahepatic cholangiocarcinoma ([place]) 04/12/2021   Goals of care, counseling/discussion 04/12/2021   Mild episode of recurrent major depressive disorder ([place]) 02/08/2019   Dyslipidemia associated with type 2 diabetes mellitus ([place]) 12/17/2017   Spinal stenosis 10/15/2016   Diabetic polyneuropathy associated with type 2 diabetes mellitus ([place]) 07/14/2016   Chronic midline low back pain with sciatica 06/22/2016   Primary osteoarthritis of both knees 06/22/2016   Carpal tunnel syndrome 07/01/2015   Cervical radiculitis 07/01/2015   Corn or callus 07/01/2015   Impingement syndrome of shoulder 07/01/2015   Microalbuminuria 07/01/2015   Tenosynovitis of wrist 07/01/2015   Athlete's foot 08/05/2009   Vitamin D deficiency 01/08/2009   Allergic rhinitis 12/28/2008   Lumbosacral neuritis 08/11/2007    Past Surgical History:  Procedure Laterality Date   ABDOMINAL HYSTERECTOMY     COLONOSCOPY WITH PROPOFOL N/A 05/06/2021   Procedure: COLONOSCOPY WITH PROPOFOL;  Surgeon: Lucilla Lame, MD;  Location: [facility] ENDOSCOPY;  Service: Endoscopy;  Laterality: N/A;   ESOPHAGOGASTRODUODENOSCOPY (EGD) WITH PROPOFOL N/A 05/06/2021   Procedure: ESOPHAGOGASTRODUODENOSCOPY (EGD) WITH PROPOFOL;  Surgeon: Lucilla Lame, MD;  Location: [facility] ENDOSCOPY;  Service: Endoscopy;  Laterality: N/A;  PORTA CATH INSERTION N/A 07/17/2021   Procedure: PORTA CATH INSERTION;  Surgeon: Algernon Huxley, MD;  Location: [place] [facility];  Service: Cardiovascular;  Laterality: N/A;    Family History  Problem Relation Age of Onset   Heart attack Mother    CAD Mother    Kidney disease Father     Social History   Tobacco Use   Smoking status: Some Days    Types: Cigarettes   Smokeless tobacco: Never   Tobacco  comments:    1 per day 01/30/21 does not inhale it  Substance Use Topics   Alcohol use: Not Currently     Current Outpatient Medications:    albuterol (VENTOLIN HFA) 108 (90 Base) MCG/ACT inhaler, INHALE TWO PUFFS BY MOUTH EVERY 6 HOURS AS NEEDED FOR WHEEZING AND FOR SHORTNESS OF BREATH (BULK), Disp: 18 g, Rfl: 0   aspirin 81 MG tablet, Take 1 tablet by mouth daily., Disp: , Rfl:    blood glucose meter kit and supplies, Dispense based on patient and insurance preference. Use up to four times daily as directed. (FOR ICD-10 E10.9, E11.9)., Disp: 1 each, Rfl: 0   diphenhydramine-acetaminophen (TYLENOL PM) 25-500 MG TABS tablet, Take 1 tablet by mouth at bedtime as needed., Disp: , Rfl:    fluticasone (FLONASE) 50 MCG/ACT nasal spray, Place 2 sprays into both nostrils daily as needed. USE 2 SPRAY(S) IN EACH NOSTRIL AT BEDTIME, Disp: 16 g, Rfl: 0   Fluticasone-Umeclidin-Vilant (TRELEGY ELLIPTA) 100-62.5-25 MCG/ACT AEPB, Inhale 1 puff into the lungs daily., Disp: 3 each, Rfl: 1   Insulin Pen Needle 32G X 6 MM MISC, 1 each by Does not apply route daily at 12 noon., Disp: 100 each, Rfl: 1   lidocaine-prilocaine (EMLA) cream, Apply 1 Application topically as needed. Apply to port and cover with saran wrap 1-2 hours prior to port access, Disp: 30 g, Rfl: 3   ondansetron (ZOFRAN) 4 MG tablet, Take 1 tablet (4 mg total) by mouth every 8 (eight) hours as needed., Disp: 20 tablet, Rfl: 0   atorvastatin (LIPITOR) 40 MG tablet, Take 1 tablet (40 mg total) by mouth daily., Disp: 90 tablet, Rfl: 1   escitalopram (LEXAPRO) 20 MG tablet, TAKE ONE TABLET BY MOUTH DAILY AT 9AM, Disp: 90 tablet, Rfl: 1   gabapentin (NEURONTIN) 300 MG capsule, TAKE TWO CAPSULES BY MOUTH TWICE DAILY @ 9AM & 5PM, Disp: 360 capsule, Rfl: 1   insulin degludec (TRESIBA FLEXTOUCH) 100 UNIT/ML FlexTouch Pen, Inject 20 Units into the skin daily., Disp: 15 mL, Rfl: 1 No current facility-administered medications for this  visit.  Facility-Administered Medications Ordered in Other Visits:    heparin lock flush 100 UNIT/ML injection, , , ,   Allergies  Allergen Reactions   Other Shortness Of Breath    Cat dander and grass pollen   Cisplatin Itching   Bydureon [Exenatide] Other (See Comments)    Pain with injection not allergy    I personally reviewed active problem list, medication list, allergies, family history, social history, health maintenance with the patient/caregiver today.   ROS  Constitutional: Negative for fever , positive for  weight change.  Respiratory: Negative for cough and shortness of breath.   Cardiovascular: Negative for chest pain or palpitations.  Gastrointestinal: Negative for abdominal pain, no bowel changes.  Musculoskeletal: Negative for gait problem or joint swelling.  Skin: Negative for rash.  Neurological: Negative for dizziness or headache.  No other specific complaints in a complete review of systems (except as  listed in HPI above).   Objective  Vitals:   09/25/22 1128  BP: 130/72  Pulse: 82  Resp: 16  SpO2: 100%  Weight: 169 lb (76.7 kg)  Height: $Remove'5\' 7"'uTVeUaD$  (1.702 m)    Body mass index is 26.47 kg/m.  Physical Exam  Constitutional: Patient appears well-developed and well-nourished. No distress.  HEENT: head atraumatic, normocephalic, pupils equal and reactive to light, neck supple Cardiovascular: Normal rate, regular rhythm and normal heart sounds.  No murmur heard. No BLE edema. Pulmonary/Chest: Effort normal and breath sounds normal. No respiratory distress. Abdominal: Soft.  There is no tenderness. Psychiatric: Patient has a normal mood and affect. behavior is normal. Judgment and thought content normal.   PHQ2/9:    09/25/2022   11:36 AM 02/03/2022   10:56 AM 12/26/2021   10:23 AM 11/13/2021    1:01 PM 11/13/2021   12:56 PM  Depression screen PHQ 2/9  Decreased Interest 0 1 0 0 0  Down, Depressed, Hopeless 1 1 0 0 0  PHQ - 2 Score 1 2 0 0 0   Altered sleeping 0 0 0 0 0  Tired, decreased energy 0 0 0 0 0  Change in appetite 0 0 0 0 0  Feeling bad or failure about yourself  0 0 0 0 0  Trouble concentrating 0 0 0 0 0  Moving slowly or fidgety/restless 0 0 0 0 0  Suicidal thoughts 0 0 0 0 0  PHQ-9 Score 1 2 0 0 0  Difficult doing work/chores  Not difficult at all  Not difficult at all Not difficult at all    phq 9 is positive   Fall Risk:    09/25/2022   11:36 AM 02/03/2022   11:02 AM 12/26/2021   10:22 AM 11/13/2021   12:57 PM 11/13/2021   12:56 PM  Fall Risk   Falls in the past year? 0 0 0 0 0  Number falls in past yr: 0 0 0 0 0  Injury with Fall? 0 0 0 0 0  Risk for fall due to : No Fall Risks No Fall Risks No Fall Risks  No Fall Risks  Follow up Falls prevention discussed Falls prevention discussed Falls prevention discussed  Falls prevention discussed      Functional Status Survey: Is the patient deaf or have difficulty hearing?: No Does the patient have difficulty seeing, even when wearing glasses/contacts?: No Does the patient have difficulty concentrating, remembering, or making decisions?: No Does the patient have difficulty walking or climbing stairs?: No Does the patient have difficulty dressing or bathing?: No Does the patient have difficulty doing errands alone such as visiting a doctor's office or shopping?: No    Assessment & Plan   1. Mild episode of recurrent major depressive disorder (HCC)  - escitalopram (LEXAPRO) 20 MG tablet; TAKE ONE TABLET BY MOUTH DAILY AT 9AM  Dispense: 90 tablet; Refill: 1  2. Diabetic polyneuropathy associated with type 2 diabetes mellitus (HCC)  - POCT HgB A1C - MM 3D SCREEN BREAST BILATERAL; Future - insulin degludec (TRESIBA FLEXTOUCH) 100 UNIT/ML FlexTouch Pen; Inject 20 Units into the skin daily.  Dispense: 15 mL; Refill: 1 - Insulin Pen Needle 32G X 6 MM MISC; 1 each by Does not apply route daily at 12 noon.  Dispense: 100 each; Refill: 1 - gabapentin  (NEURONTIN) 300 MG capsule; TAKE TWO CAPSULES BY MOUTH TWICE DAILY @ 9AM & 5PM  Dispense: 360 capsule; Refill: 1  3. Dyslipidemia associated with type  2 diabetes mellitus ([place])  - POCT HgB A1C - MM 3D SCREEN BREAST BILATERAL; Future - insulin degludec (TRESIBA FLEXTOUCH) 100 UNIT/ML FlexTouch Pen; Inject 20 Units into the skin daily.  Dispense: 15 mL; Refill: 1 - Insulin Pen Needle 32G X 6 MM MISC; 1 each by Does not apply route daily at 12 noon.  Dispense: 100 each; Refill: 1 - atorvastatin (LIPITOR) 40 MG tablet; Take 1 tablet (40 mg total) by mouth daily.  Dispense: 90 tablet; Refill: 1  4. Moderate protein-calorie malnutrition ([place])  Discussed protein supplementation   5. Simple chronic bronchitis ([place])  - Fluticasone-Umeclidin-Vilant (TRELEGY ELLIPTA) 100-62.5-25 MCG/ACT AEPB; Inhale 1 puff into the lungs daily.  Dispense: 3 each; Refill: 1  6. Breast cancer screening by mammogram  - MM 3D SCREEN BREAST BILATERAL; Future  7. Need for immunization against influenza  - Flu Vaccine QUAD 65moIM (Fluarix, Fluzone & Alfiuria Quad PF)

## 2022-09-24 ENCOUNTER — Encounter: Payer: Self-pay | Admitting: Licensed Clinical Social Worker

## 2022-09-24 NOTE — Progress Notes (Unsigned)
[place] CSW Progress Note  Clinical Education officer, museum contacted patient by phone to discuss financial assistance concerns. Patient stated she left electricity bill with from office on 09/18/2022.     Adelene Amas, LCSW    Patient is participating in a Managed Medicaid Plan:  {MM YES/NO:27447::"Yes"}

## 2022-09-25 ENCOUNTER — Ambulatory Visit (INDEPENDENT_AMBULATORY_CARE_PROVIDER_SITE_OTHER): Payer: Medicare Other | Admitting: Family Medicine

## 2022-09-25 ENCOUNTER — Encounter: Payer: Self-pay | Admitting: Family Medicine

## 2022-09-25 VITALS — BP 130/72 | HR 82 | Resp 16 | Ht 67.0 in | Wt 169.0 lb

## 2022-09-25 DIAGNOSIS — E785 Hyperlipidemia, unspecified: Secondary | ICD-10-CM

## 2022-09-25 DIAGNOSIS — E118 Type 2 diabetes mellitus with unspecified complications: Secondary | ICD-10-CM

## 2022-09-25 DIAGNOSIS — E1142 Type 2 diabetes mellitus with diabetic polyneuropathy: Secondary | ICD-10-CM | POA: Diagnosis not present

## 2022-09-25 DIAGNOSIS — Z23 Encounter for immunization: Secondary | ICD-10-CM | POA: Diagnosis not present

## 2022-09-25 DIAGNOSIS — E440 Moderate protein-calorie malnutrition: Secondary | ICD-10-CM

## 2022-09-25 DIAGNOSIS — J410 Simple chronic bronchitis: Secondary | ICD-10-CM | POA: Insufficient documentation

## 2022-09-25 DIAGNOSIS — F331 Major depressive disorder, recurrent, moderate: Secondary | ICD-10-CM | POA: Insufficient documentation

## 2022-09-25 DIAGNOSIS — E1169 Type 2 diabetes mellitus with other specified complication: Secondary | ICD-10-CM | POA: Diagnosis not present

## 2022-09-25 DIAGNOSIS — Z1231 Encounter for screening mammogram for malignant neoplasm of breast: Secondary | ICD-10-CM

## 2022-09-25 DIAGNOSIS — F330 Major depressive disorder, recurrent, mild: Secondary | ICD-10-CM | POA: Diagnosis not present

## 2022-09-25 DIAGNOSIS — Z720 Tobacco use: Secondary | ICD-10-CM

## 2022-09-25 LAB — POCT GLYCOSYLATED HEMOGLOBIN (HGB A1C): Hemoglobin A1C: 5.9 % — AB (ref 4.0–5.6)
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
## 2022-09-25 MED ORDER — GABAPENTIN 300 MG PO CAPS: ORAL_CAPSULE | ORAL | 1 refills | Status: AC

## 2022-09-25 MED ORDER — ESCITALOPRAM OXALATE 20 MG PO TABS: ORAL_TABLET | ORAL | 1 refills | Status: AC

## 2022-09-25 MED ORDER — TRELEGY ELLIPTA 100-62.5-25 MCG/ACT IN AEPB: 1.0000 | INHALATION_SPRAY | Freq: Every day | RESPIRATORY_TRACT | 1 refills | Status: AC

## 2022-09-25 MED ORDER — ATORVASTATIN CALCIUM 40 MG PO TABS: 40.0000 mg | ORAL_TABLET | Freq: Every day | ORAL | 1 refills | Status: AC

## 2022-09-25 MED ORDER — INSULIN PEN NEEDLE 32G X 6 MM MISC: 1.0000 | Freq: Every day | 1 refills | Status: DC

## 2022-09-25 MED ORDER — TRESIBA FLEXTOUCH 100 UNIT/ML ~~LOC~~ SOPN: 20.0000 [IU] | PEN_INJECTOR | Freq: Every day | SUBCUTANEOUS | 1 refills | Status: DC
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
## 2022-10-01 ENCOUNTER — Emergency Department: Payer: Medicare Other

## 2022-10-01 ENCOUNTER — Other Ambulatory Visit: Payer: Self-pay

## 2022-10-01 ENCOUNTER — Inpatient Hospital Stay
Admission: EM | Admit: 2022-10-01 | Discharge: 2022-10-08 | DRG: 029 | Payer: Medicare Other | Attending: Internal Medicine | Admitting: Internal Medicine

## 2022-10-01 DIAGNOSIS — M25552 Pain in left hip: Secondary | ICD-10-CM | POA: Diagnosis present

## 2022-10-01 DIAGNOSIS — E669 Obesity, unspecified: Secondary | ICD-10-CM | POA: Diagnosis present

## 2022-10-01 DIAGNOSIS — Z794 Long term (current) use of insulin: Secondary | ICD-10-CM | POA: Diagnosis not present

## 2022-10-01 DIAGNOSIS — N390 Urinary tract infection, site not specified: Secondary | ICD-10-CM | POA: Diagnosis present

## 2022-10-01 DIAGNOSIS — Z7951 Long term (current) use of inhaled steroids: Secondary | ICD-10-CM

## 2022-10-01 DIAGNOSIS — S01412A Laceration without foreign body of left cheek and temporomandibular area, initial encounter: Secondary | ICD-10-CM | POA: Diagnosis present

## 2022-10-01 DIAGNOSIS — I10 Essential (primary) hypertension: Secondary | ICD-10-CM | POA: Diagnosis present

## 2022-10-01 DIAGNOSIS — S0181XA Laceration without foreign body of other part of head, initial encounter: Secondary | ICD-10-CM | POA: Diagnosis present

## 2022-10-01 DIAGNOSIS — F17210 Nicotine dependence, cigarettes, uncomplicated: Secondary | ICD-10-CM | POA: Diagnosis present

## 2022-10-01 DIAGNOSIS — Z79899 Other long term (current) drug therapy: Secondary | ICD-10-CM

## 2022-10-01 DIAGNOSIS — C221 Intrahepatic bile duct carcinoma: Secondary | ICD-10-CM | POA: Diagnosis present

## 2022-10-01 DIAGNOSIS — N1831 Chronic kidney disease, stage 3a: Secondary | ICD-10-CM | POA: Diagnosis present

## 2022-10-01 DIAGNOSIS — Z1152 Encounter for screening for COVID-19: Secondary | ICD-10-CM

## 2022-10-01 DIAGNOSIS — L89311 Pressure ulcer of right buttock, stage 1: Secondary | ICD-10-CM | POA: Diagnosis not present

## 2022-10-01 DIAGNOSIS — S12191A Other nondisplaced fracture of second cervical vertebra, initial encounter for closed fracture: Secondary | ICD-10-CM | POA: Diagnosis present

## 2022-10-01 DIAGNOSIS — E1122 Type 2 diabetes mellitus with diabetic chronic kidney disease: Secondary | ICD-10-CM | POA: Diagnosis present

## 2022-10-01 DIAGNOSIS — N319 Neuromuscular dysfunction of bladder, unspecified: Secondary | ICD-10-CM | POA: Diagnosis present

## 2022-10-01 DIAGNOSIS — W010XXA Fall on same level from slipping, tripping and stumbling without subsequent striking against object, initial encounter: Secondary | ICD-10-CM | POA: Diagnosis present

## 2022-10-01 DIAGNOSIS — S0990XA Unspecified injury of head, initial encounter: Secondary | ICD-10-CM | POA: Diagnosis not present

## 2022-10-01 DIAGNOSIS — S14122A Central cord syndrome at C2 level of cervical spinal cord, initial encounter: Secondary | ICD-10-CM | POA: Diagnosis present

## 2022-10-01 DIAGNOSIS — E1165 Type 2 diabetes mellitus with hyperglycemia: Secondary | ICD-10-CM | POA: Diagnosis present

## 2022-10-01 DIAGNOSIS — K592 Neurogenic bowel, not elsewhere classified: Secondary | ICD-10-CM | POA: Diagnosis present

## 2022-10-01 DIAGNOSIS — Z8249 Family history of ischemic heart disease and other diseases of the circulatory system: Secondary | ICD-10-CM

## 2022-10-01 DIAGNOSIS — I129 Hypertensive chronic kidney disease with stage 1 through stage 4 chronic kidney disease, or unspecified chronic kidney disease: Secondary | ICD-10-CM | POA: Diagnosis present

## 2022-10-01 DIAGNOSIS — D631 Anemia in chronic kidney disease: Secondary | ICD-10-CM | POA: Diagnosis present

## 2022-10-01 DIAGNOSIS — W19XXXA Unspecified fall, initial encounter: Secondary | ICD-10-CM

## 2022-10-01 DIAGNOSIS — S12100A Unspecified displaced fracture of second cervical vertebra, initial encounter for closed fracture: Secondary | ICD-10-CM | POA: Diagnosis present

## 2022-10-01 DIAGNOSIS — E875 Hyperkalemia: Secondary | ICD-10-CM | POA: Diagnosis present

## 2022-10-01 DIAGNOSIS — M4802 Spinal stenosis, cervical region: Secondary | ICD-10-CM | POA: Diagnosis present

## 2022-10-01 DIAGNOSIS — S14129A Central cord syndrome at unspecified level of cervical spinal cord, initial encounter: Secondary | ICD-10-CM | POA: Diagnosis not present

## 2022-10-01 DIAGNOSIS — G8250 Quadriplegia, unspecified: Secondary | ICD-10-CM | POA: Diagnosis not present

## 2022-10-01 DIAGNOSIS — M532X2 Spinal instabilities, cervical region: Secondary | ICD-10-CM

## 2022-10-01 DIAGNOSIS — M79641 Pain in right hand: Secondary | ICD-10-CM | POA: Diagnosis not present

## 2022-10-01 DIAGNOSIS — E1169 Type 2 diabetes mellitus with other specified complication: Secondary | ICD-10-CM | POA: Diagnosis present

## 2022-10-01 DIAGNOSIS — J410 Simple chronic bronchitis: Secondary | ICD-10-CM | POA: Diagnosis present

## 2022-10-01 DIAGNOSIS — L8993 Pressure ulcer of unspecified site, stage 3: Secondary | ICD-10-CM | POA: Insufficient documentation

## 2022-10-01 DIAGNOSIS — W06XXXA Fall from bed, initial encounter: Secondary | ICD-10-CM | POA: Diagnosis not present

## 2022-10-01 DIAGNOSIS — R509 Fever, unspecified: Secondary | ICD-10-CM

## 2022-10-01 DIAGNOSIS — M1990 Unspecified osteoarthritis, unspecified site: Secondary | ICD-10-CM | POA: Diagnosis present

## 2022-10-01 DIAGNOSIS — S14109A Unspecified injury at unspecified level of cervical spinal cord, initial encounter: Secondary | ICD-10-CM | POA: Diagnosis present

## 2022-10-01 DIAGNOSIS — E785 Hyperlipidemia, unspecified: Secondary | ICD-10-CM | POA: Diagnosis present

## 2022-10-01 DIAGNOSIS — Z7982 Long term (current) use of aspirin: Secondary | ICD-10-CM

## 2022-10-01 DIAGNOSIS — L8990 Pressure ulcer of unspecified site, unspecified stage: Secondary | ICD-10-CM | POA: Insufficient documentation

## 2022-10-01 DIAGNOSIS — S12101A Unspecified nondisplaced fracture of second cervical vertebra, initial encounter for closed fracture: Secondary | ICD-10-CM

## 2022-10-01 DIAGNOSIS — Z6827 Body mass index (BMI) 27.0-27.9, adult: Secondary | ICD-10-CM

## 2022-10-01 DIAGNOSIS — M79642 Pain in left hand: Secondary | ICD-10-CM | POA: Diagnosis not present

## 2022-10-01 DIAGNOSIS — N179 Acute kidney failure, unspecified: Secondary | ICD-10-CM | POA: Diagnosis not present

## 2022-10-01 DIAGNOSIS — J4489 Other specified chronic obstructive pulmonary disease: Secondary | ICD-10-CM | POA: Diagnosis present

## 2022-10-01 DIAGNOSIS — Z888 Allergy status to other drugs, medicaments and biological substances status: Secondary | ICD-10-CM

## 2022-10-01 DIAGNOSIS — Z841 Family history of disorders of kidney and ureter: Secondary | ICD-10-CM

## 2022-10-01 LAB — CBC WITH DIFFERENTIAL/PLATELET
Abs Immature Granulocytes: 0.06 10*3/uL (ref 0.00–0.07)
Basophils Absolute: 0 10*3/uL (ref 0.0–0.1)
Basophils Relative: 0 %
Eosinophils Absolute: 0.1 10*3/uL (ref 0.0–0.5)
Eosinophils Relative: 1 %
HCT: 31 % — ABNORMAL LOW (ref 36.0–46.0)
Hemoglobin: 9.7 g/dL — ABNORMAL LOW (ref 12.0–15.0)
Immature Granulocytes: 1 %
Lymphocytes Relative: 11 %
Lymphs Abs: 1.4 10*3/uL (ref 0.7–4.0)
MCH: 31 pg (ref 26.0–34.0)
MCHC: 31.3 g/dL (ref 30.0–36.0)
MCV: 99 fL (ref 80.0–100.0)
Monocytes Absolute: 1.7 10*3/uL — ABNORMAL HIGH (ref 0.1–1.0)
Monocytes Relative: 14 %
Neutro Abs: 9 10*3/uL — ABNORMAL HIGH (ref 1.7–7.7)
Neutrophils Relative %: 73 %
Platelets: 97 10*3/uL — ABNORMAL LOW (ref 150–400)
RBC: 3.13 MIL/uL — ABNORMAL LOW (ref 3.87–5.11)
RDW: 20.3 % — ABNORMAL HIGH (ref 11.5–15.5)
WBC: 12.2 10*3/uL — ABNORMAL HIGH (ref 4.0–10.5)
nRBC: 0 % (ref 0.0–0.2)

## 2022-10-01 LAB — PROTIME-INR
INR: 1.2 (ref 0.8–1.2)
Prothrombin Time: 15.4 seconds — ABNORMAL HIGH (ref 11.4–15.2)

## 2022-10-01 LAB — BASIC METABOLIC PANEL
Anion gap: 5 (ref 5–15)
BUN: 13 mg/dL (ref 8–23)
CO2: 21 mmol/L — ABNORMAL LOW (ref 22–32)
Calcium: 7.9 mg/dL — ABNORMAL LOW (ref 8.9–10.3)
Chloride: 110 mmol/L (ref 98–111)
Creatinine, Ser: 1.3 mg/dL — ABNORMAL HIGH (ref 0.44–1.00)
GFR, Estimated: 46 mL/min — ABNORMAL LOW (ref >60)
Glucose, Bld: 243 mg/dL — ABNORMAL HIGH (ref 70–99)
Potassium: 5.7 mmol/L — ABNORMAL HIGH (ref 3.5–5.1)
Sodium: 136 mmol/L (ref 135–145)

## 2022-10-01 LAB — CBG MONITORING, ED: Glucose-Capillary: 263 mg/dL — ABNORMAL HIGH (ref 70–99)

## 2022-10-01 LAB — SARS CORONAVIRUS 2 BY RT PCR: SARS Coronavirus 2 by RT PCR: NEGATIVE

## 2022-10-01 MED ORDER — ONDANSETRON HCL 4 MG/2ML IJ SOLN
4.0000 mg | Freq: Four times a day (QID) | INTRAMUSCULAR | Status: DC | PRN
  Administered 2022-10-02: 4 mg via INTRAVENOUS
  Filled 2022-10-01: qty 2

## 2022-10-01 MED ORDER — GABAPENTIN 300 MG PO CAPS
300.0000 mg | ORAL_CAPSULE | Freq: Two times a day (BID) | ORAL | Status: DC
  Administered 2022-10-02 – 2022-10-08 (×12): 300 mg via ORAL
  Filled 2022-10-01 (×12): qty 1

## 2022-10-01 MED ORDER — PATIROMER SORBITEX CALCIUM 8.4 G PO PACK
8.4000 g | PACK | Freq: Every day | ORAL | Status: DC
  Administered 2022-10-05 – 2022-10-06 (×2): 8.4 g via ORAL
  Filled 2022-10-01 (×6): qty 1

## 2022-10-01 MED ORDER — MORPHINE SULFATE (PF) 2 MG/ML IV SOLN
2.0000 mg | INTRAVENOUS | Status: DC | PRN
  Administered 2022-10-02 – 2022-10-07 (×15): 2 mg via INTRAVENOUS
  Filled 2022-10-01 (×15): qty 1

## 2022-10-01 MED ORDER — MORPHINE SULFATE (PF) 4 MG/ML IV SOLN
4.0000 mg | Freq: Once | INTRAVENOUS | Status: AC
  Administered 2022-10-01: 4 mg via INTRAVENOUS
  Filled 2022-10-01: qty 1

## 2022-10-01 MED ORDER — ACETAMINOPHEN 325 MG PO TABS
650.0000 mg | ORAL_TABLET | Freq: Four times a day (QID) | ORAL | Status: DC | PRN
  Administered 2022-10-05 – 2022-10-08 (×6): 650 mg via ORAL
  Filled 2022-10-01 (×6): qty 2

## 2022-10-01 MED ORDER — SODIUM CHLORIDE 0.9 % IV BOLUS
1000.0000 mL | Freq: Once | INTRAVENOUS | Status: AC
  Administered 2022-10-01: 1000 mL via INTRAVENOUS

## 2022-10-01 MED ORDER — INSULIN ASPART 100 UNIT/ML IJ SOLN
0.0000 [IU] | INTRAMUSCULAR | Status: DC
  Administered 2022-10-01: 8 [IU] via SUBCUTANEOUS
  Administered 2022-10-02: 11 [IU] via SUBCUTANEOUS
  Administered 2022-10-02: 8 [IU] via SUBCUTANEOUS
  Administered 2022-10-03: 3 [IU] via SUBCUTANEOUS
  Administered 2022-10-03: 5 [IU] via SUBCUTANEOUS
  Filled 2022-10-01 (×5): qty 1

## 2022-10-01 MED ORDER — UMECLIDINIUM BROMIDE 62.5 MCG/ACT IN AEPB
1.0000 | INHALATION_SPRAY | Freq: Every day | RESPIRATORY_TRACT | Status: DC
  Administered 2022-10-03 – 2022-10-08 (×5): 1 via RESPIRATORY_TRACT
  Filled 2022-10-01: qty 7

## 2022-10-01 MED ORDER — ESCITALOPRAM OXALATE 10 MG PO TABS
20.0000 mg | ORAL_TABLET | Freq: Every day | ORAL | Status: DC
  Administered 2022-10-03 – 2022-10-08 (×6): 20 mg via ORAL
  Filled 2022-10-01 (×6): qty 2

## 2022-10-01 MED ORDER — ALBUTEROL SULFATE (2.5 MG/3ML) 0.083% IN NEBU: 2.5000 mg | INHALATION_SOLUTION | RESPIRATORY_TRACT | Status: DC | PRN

## 2022-10-01 MED ORDER — FLUTICASONE FUROATE-VILANTEROL 100-25 MCG/ACT IN AEPB
1.0000 | INHALATION_SPRAY | Freq: Every day | RESPIRATORY_TRACT | Status: DC
  Administered 2022-10-03 – 2022-10-08 (×5): 1 via RESPIRATORY_TRACT
  Filled 2022-10-01: qty 28

## 2022-10-01 MED ORDER — LIDOCAINE-EPINEPHRINE 2 %-1:100000 IJ SOLN
20.0000 mL | Freq: Once | INTRAMUSCULAR | Status: AC
  Administered 2022-10-01: 20 mL via INTRADERMAL
  Filled 2022-10-01: qty 1

## 2022-10-01 MED ORDER — ATORVASTATIN CALCIUM 20 MG PO TABS
40.0000 mg | ORAL_TABLET | Freq: Every day | ORAL | Status: DC
  Administered 2022-10-03 – 2022-10-08 (×6): 40 mg via ORAL
  Filled 2022-10-01 (×7): qty 2

## 2022-10-01 MED ORDER — ACETAMINOPHEN 650 MG RE SUPP: 650.0000 mg | Freq: Four times a day (QID) | RECTAL | Status: DC | PRN

## 2022-10-01 MED ORDER — INSULIN GLARGINE-YFGN 100 UNIT/ML ~~LOC~~ SOLN
8.0000 [IU] | Freq: Every day | SUBCUTANEOUS | Status: DC
  Administered 2022-10-02 – 2022-10-07 (×6): 8 [IU] via SUBCUTANEOUS
  Filled 2022-10-01 (×9): qty 0.08

## 2022-10-01 MED ORDER — ONDANSETRON HCL 4 MG PO TABS: 4.0000 mg | ORAL_TABLET | Freq: Four times a day (QID) | ORAL | Status: DC | PRN

## 2022-10-01 MED FILL — Dexamethasone Sodium Phosphate Inj 100 MG/10ML: INTRAMUSCULAR | Qty: 1 | Status: AC

## 2022-10-01 NOTE — ED Notes (Signed)
Cervical collar was applied to pt, Pt tolerated well.

## 2022-10-01 NOTE — ED Notes (Signed)
Pt called family member using personal device and with assistance from RN.

## 2022-10-01 NOTE — Consult Note (Signed)
Full note to follow:  61 yo female presented with fall.  She had onset of numbness and weakness.  During workup C2 fracture was noted.  MRI shows spinal cord contusion at C4-5 with clinical central cord syndrome.  Mri and CT reviewed.  MRI has not been officially read, but she has myelomalacia due to acute spinal cord contusion at C4-5. She has severe stenosis at that level.  Plan)  - admit for BP monitoring.  Goal MAP>80 for 5 days - NPO after midnight - Plan ACDF C4-5 tomorrow

## 2022-10-01 NOTE — ED Notes (Signed)
Pt adamently wanting to go home.  Spoke with Dr who reports she is suppose to see neurosurgery and if she leaves it has to be AMA.  Explained this to pt and son...son requests a note for work and encouraged pt to stay.

## 2022-10-01 NOTE — Assessment & Plan Note (Signed)
Sutured in the ED Wound care

## 2022-10-01 NOTE — Assessment & Plan Note (Signed)
DuoNebs as needed Trelegy Ellipta

## 2022-10-01 NOTE — H&P (Signed)
History and Physical    Patient: Janice Short:[id number] DOB: 07-28-1961 DOA: 10/01/2022 DOS: the patient was seen and examined on 10/01/2022 PCP: Steele Sizer, MD  Patient coming from: Home  Chief Complaint:  Chief Complaint  Patient presents with   Fall    Head was hit on bed rail during fall    HPI: Janice Short is a 61 y.o. female with medical history significant for DM, T2 IDDM, chronic bronchitis, intrahepatic cholangiocarcinoma on chemotherapy, last infusion 09/18/2022, who presents to the ED following an accidental fall, when she tripped, fell forward striking her face on a railing.  She was unable to help herself up.  She sustained a laceration to the left cheek and complained of left hip pain and bilateral upper extremity numbness since the fall.  She was previously in her usual state of health, and denied preceding lightheadedness, palpitations or chest pain, one-sided weakness or numbness.  Had no previous nausea or vomiting or diarrhea or abdominal pain. ED course and data review: Temp 99.9 with pulse 103, respirations 24 and O2 sat 97% on room air, BP 129/61. Labs significant for WBC 12,000, up from 7002 weeks prior, hemoglobin 9.7 which is above baseline.  Glucose 243, creatinine 1.3 which is around baseline and potassium 5.7 COVID-negative. EKG, personally viewed and interpreted showing sinus tachycardia at 105. Trauma imaging including x-ray left knee and hip, CT left hip, chest x-ray, CT head, C-spine and maxillofacial as well as MRI cervical spine concerning for the following on MRI C-spine: IMPRESSION: 1. Nondisplaced fracture anterior inferior body of C2 not appreciated by MRI. No other fracture. No epidural hematoma. 2. Multilevel degenerative change most prominent at C4-5. Moderate spinal stenosis at C4-5 with bilateral cord hyperintensity compatible with chronic compressive myelopathy.  Patient was placed in a Aspen collar. The ED provider spoke with  neurosurgeon, Dr. Cari Caraway who would like to take patient to the OR on 10/27 for ACDF related to acute spinal cord contusion at C4-C5 due to severe stenosis at that level.  Please refer to consult note for details.  Patient treated with IV morphine.  Hospitalist consulted for admission.   Review of Systems: As mentioned in the history of present illness. All other systems reviewed and are negative.  Past Medical History:  Diagnosis Date   Allergy    Asthma    Carpal tunnel syndrome on left    Cervical radiculitis    Chronic osteoarthritis    Corns and callosity    Depression    Dermatophytosis of foot    Diabetes mellitus without complication (HCC)    Gout    Hyperlipidemia    Hypertension    Impingement syndrome of left shoulder    Intrahepatic cholangiocarcinoma (HCC)    Lumbosacral neuritis    Microalbuminuria    Obesity    Supraventricular tachycardia 07/01/2015   SVT (supraventricular tachycardia)    Tenosynovitis of wrist    Vitamin D deficiency    Past Surgical History:  Procedure Laterality Date   ABDOMINAL HYSTERECTOMY     COLONOSCOPY WITH PROPOFOL N/A 05/06/2021   Procedure: COLONOSCOPY WITH PROPOFOL;  Surgeon: Lucilla Lame, MD;  Location: [facility] ENDOSCOPY;  Service: Endoscopy;  Laterality: N/A;   ESOPHAGOGASTRODUODENOSCOPY (EGD) WITH PROPOFOL N/A 05/06/2021   Procedure: ESOPHAGOGASTRODUODENOSCOPY (EGD) WITH PROPOFOL;  Surgeon: Lucilla Lame, MD;  Location: [facility] ENDOSCOPY;  Service: Endoscopy;  Laterality: N/A;   PORTA CATH INSERTION N/A 07/17/2021   Procedure: PORTA CATH INSERTION;  Surgeon: Algernon Huxley, MD;  Location:  [place] [facility];  Service: Cardiovascular;  Laterality: N/A;   Social History:  reports that she has been smoking cigarettes. She has never used smokeless tobacco. She reports that she does not currently use alcohol. She reports that she does not currently use drugs after having used the following drugs: Benzodiazepines.  Allergies  Allergen  Reactions   Other Shortness Of Breath    Cat dander and grass pollen   Cisplatin Itching   Bydureon [Exenatide] Other (See Comments)    Pain with injection not allergy    Family History  Problem Relation Age of Onset   Heart attack Mother    CAD Mother    Kidney disease Father     Prior to Admission medications   Medication Sig Start Date End Date Taking? Authorizing Provider  albuterol (VENTOLIN HFA) 108 (90 Base) MCG/ACT inhaler INHALE TWO PUFFS BY MOUTH EVERY 6 HOURS AS NEEDED FOR WHEEZING AND FOR SHORTNESS OF BREATH (BULK) 08/10/22   Steele Sizer, MD  aspirin 81 MG tablet Take 1 tablet by mouth daily. 11/05/09   [provider]  atorvastatin (LIPITOR) 40 MG tablet Take 1 tablet (40 mg total) by mouth daily. 09/25/22   Steele Sizer, MD  blood glucose meter kit and supplies Dispense based on patient and insurance preference. Use up to four times daily as directed. (FOR ICD-10 E10.9, E11.9). 04/29/21   Steele Sizer, MD  diphenhydramine-acetaminophen (TYLENOL PM) 25-500 MG TABS tablet Take 1 tablet by mouth at bedtime as needed.    [provider]  escitalopram (LEXAPRO) 20 MG tablet TAKE ONE TABLET BY MOUTH DAILY AT 9AM 09/25/22   Steele Sizer, MD  fluticasone (FLONASE) 50 MCG/ACT nasal spray Place 2 sprays into both nostrils daily as needed. USE 2 SPRAY(S) IN EACH NOSTRIL AT BEDTIME 07/06/22   Sowles, Drue Stager, MD  Fluticasone-Umeclidin-Vilant (TRELEGY ELLIPTA) 100-62.5-25 MCG/ACT AEPB Inhale 1 puff into the lungs daily. 09/25/22   Steele Sizer, MD  gabapentin (NEURONTIN) 300 MG capsule TAKE TWO CAPSULES BY MOUTH TWICE DAILY @ 9AM & 5PM 09/25/22   Steele Sizer, MD  insulin degludec (TRESIBA FLEXTOUCH) 100 UNIT/ML FlexTouch Pen Inject 20 Units into the skin daily. 09/25/22   Steele Sizer, MD  Insulin Pen Needle 32G X 6 MM MISC 1 each by Does not apply route daily at 12 noon. 09/25/22   Steele Sizer, MD  lidocaine-prilocaine (EMLA) cream Apply 1  Application topically as needed. Apply to port and cover with saran wrap 1-2 hours prior to port access 08/18/22   Sindy Guadeloupe, MD  ondansetron (ZOFRAN) 4 MG tablet Take 1 tablet (4 mg total) by mouth every 8 (eight) hours as needed. 04/29/22   Steele Sizer, MD    Physical Exam: Vitals:   10/01/22 1630 10/01/22 1845 10/01/22 1900 10/01/22 2107  BP: 129/61 123/63 120/62 138/68  Pulse: (!) 103  (!) 106 95  Resp: (!) 24  16 16   Temp: 99.9 F (37.7 C)  98.9 F (37.2 C) 98.7 F (37.1 C)  TempSrc: Oral   Oral  SpO2: 97%  100% 99%  Weight:      Height:       Physical Exam Vitals and nursing note reviewed.  Constitutional:      General: She is not in acute distress. HENT:     Head: Normocephalic. Laceration present.     Comments: Laceration right cheek Neck:     Comments: Neck collar Cardiovascular:     Rate and Rhythm: Normal rate and regular rhythm.  Heart sounds: Normal heart sounds.  Pulmonary:     Effort: Pulmonary effort is normal.     Breath sounds: Normal breath sounds.  Abdominal:     Palpations: Abdomen is soft.     Tenderness: There is no abdominal tenderness.  Neurological:     Motor: Weakness present.     Comments: Weakness left arm Left hip pain on palpation     Labs on Admission: I have personally reviewed following labs and imaging studies  CBC: Recent Labs  Lab 10/01/22 1631  WBC 12.2*  NEUTROABS 9.0*  HGB 9.7*  HCT 31.0*  MCV 99.0  PLT 97*   Basic Metabolic Panel: Recent Labs  Lab 10/01/22 1755  NA 136  K 5.7*  CL 110  CO2 21*  GLUCOSE 243*  BUN 13  CREATININE 1.30*  CALCIUM 7.9*   GFR: Estimated Creatinine Clearance: 48.2 mL/min (A) (by C-G formula based on SCr of 1.3 mg/dL (H)). Liver Function Tests: No results for input(s): "AST", "ALT", "ALKPHOS", "BILITOT", "PROT", "ALBUMIN" in the last 168 hours. No results for input(s): "LIPASE", "AMYLASE" in the last 168 hours. No results for input(s): "AMMONIA" in the last 168  hours. Coagulation Profile: Recent Labs  Lab 10/01/22 2106  INR 1.2   Cardiac Enzymes: No results for input(s): "CKTOTAL", "CKMB", "CKMBINDEX", "TROPONINI" in the last 168 hours. BNP (last 3 results) No results for input(s): "PROBNP" in the last 8760 hours. HbA1C: No results for input(s): "HGBA1C" in the last 72 hours. CBG: No results for input(s): "GLUCAP" in the last 168 hours. Lipid Profile: No results for input(s): "CHOL", "HDL", "LDLCALC", "TRIG", "CHOLHDL", "LDLDIRECT" in the last 72 hours. Thyroid Function Tests: No results for input(s): "TSH", "T4TOTAL", "FREET4", "T3FREE", "THYROIDAB" in the last 72 hours. Anemia Panel: No results for input(s): "VITAMINB12", "FOLATE", "FERRITIN", "TIBC", "IRON", "RETICCTPCT" in the last 72 hours. Urine analysis: No results found for: "COLORURINE", "APPEARANCEUR", "LABSPEC", "PHURINE", "GLUCOSEU", "HGBUR", "BILIRUBINUR", "KETONESUR", "PROTEINUR", "UROBILINOGEN", "NITRITE", "LEUKOCYTESUR"  Radiological Exams on Admission: MR Cervical Spine Wo Contrast  Result Date: 10/01/2022 CLINICAL DATA:  Neck trauma.  Fall EXAM: MRI CERVICAL SPINE WITHOUT CONTRAST TECHNIQUE: Multiplanar, multisequence MR imaging of the cervical spine was performed. No intravenous contrast was administered. COMPARISON:  CT cervical spine 10/03/2022 FINDINGS: Alignment: Mild anterolisthesis C3-4. Straightening of the cervical lordosis Vertebrae: No fracture or bone marrow edema. Small cortical fracture of the anterior inferior C2 vertebral body is seen on CT but not appreciated on MRI. Cord: Bilateral cord hyperintensity at C4-5 which appears chronic and related to compressive myelopathy. No other cord signal abnormality Posterior Fossa, vertebral arteries, paraspinal tissues: Negative. No prevertebral soft tissue swelling or edema. Disc levels: C2-3: Negative for stenosis C3-4: Small central disc protrusion with calcification. Mild cord flattening and mild spinal stenosis. Mild  foraminal narrowing bilaterally due to spurring. C4-5: Moderately large central disc protrusion with associated spurring. Cord flattening with moderate spinal stenosis. Small cord hyperintensity bilaterally which appears chronic. Moderate foraminal narrowing bilaterally due to spurring C5-6: Mild disc degeneration and spurring. Mild foraminal narrowing bilaterally. Mild central canal stenosis. C6-7: Mild disc degeneration. Moderate left foraminal narrowing due to spurring C7-T1: Negative IMPRESSION: 1. Nondisplaced fracture anterior inferior body of C2 not appreciated by MRI. No other fracture. No epidural hematoma. 2. Multilevel degenerative change most prominent at C4-5. Moderate spinal stenosis at C4-5 with bilateral cord hyperintensity compatible with chronic compressive myelopathy. Electronically Signed   By: Franchot Gallo M.D.   On: 10/01/2022 20:17   CT Head Wo Contrast  Result Date: 10/01/2022 CLINICAL DATA:  Fall with laceration to right cheek EXAM: CT HEAD WITHOUT CONTRAST CT MAXILLOFACIAL WITHOUT CONTRAST CT CERVICAL SPINE WITHOUT CONTRAST TECHNIQUE: Multidetector CT imaging of the head, cervical spine, and maxillofacial structures were performed using the standard protocol without intravenous contrast. Multiplanar CT image reconstructions of the cervical spine and maxillofacial structures were also generated. RADIATION DOSE REDUCTION: This exam was performed according to the departmental dose-optimization program which includes automated exposure control, adjustment of the mA and/or kV according to patient size and/or use of iterative reconstruction technique. COMPARISON:  CT 08/30/2021 FINDINGS: CT HEAD FINDINGS Brain: No acute territorial infarction, hemorrhage or intracranial mass. Patchy white matter hypodensity likely chronic small vessel ischemic change. The ventricles are nonenlarged. Vascular: No hyperdense vessels.  Carotid vascular calcification Skull: Normal. Negative for fracture or  focal lesion. Other: Opacified right maxillary sinus. Mucosal thickening in the ethmoid sinuses CT MAXILLOFACIAL FINDINGS Osseous: Mastoid air cells are clear. Mandibular heads are normally position. No mandibular fracture. Pterygoid plates and zygomatic arches appear intact. No acute nasal bone fracture Orbits: Negative. No traumatic or inflammatory finding. Sinuses: Completely opacified right maxillary sinus. Moderate mucosal thickening in the ethmoid sinuses. No sinus wall fracture. Soft tissues: Moderate to large right periorbital and infraorbital soft tissue hematoma with small foci of gas consistent with laceration CT CERVICAL SPINE FINDINGS Alignment: Straightening of the cervical spine. No subluxation. Facet alignment within normal limits. Skull base and vertebrae: Vertebral body heights are maintained. Suspected nondisplaced fracture lucency involving the anterior inferior corner of the C2 vertebral body, best seen on sagittal images. Correlate is seen on axial series 9, image 28. No other discrete fractures are visualized. Soft tissues and spinal canal: No significant prevertebral soft tissue enlargement. Disc levels: No abnormal widening or narrowing of C2-C3 disc space. There are multilevel degenerative changes. Moderate severe disc space narrowing and degenerative change C4-C5, C5-C6, C6-C7 and C7-T1. Facet degenerative changes at multiple levels with foraminal narrowing Upper chest: Negative. Other: None IMPRESSION: 1. No CT evidence for acute intracranial abnormality. Patchy white matter hypodensity likely chronic small vessel ischemic change. 2. Findings consistent with nondisplaced teardrop type fracture involving the anterior inferior aspect of the C2 vertebral body. 3. Multilevel moderate severe degenerative changes Critical Value/emergent results were called by telephone at the time of interpretation on 10/01/2022 at 5:56 pm to provider [facility] , who verbally acknowledged these results.  Electronically Signed   By: Donavan Foil M.D.   On: 10/01/2022 17:56   CT Cervical Spine Wo Contrast  Result Date: 10/01/2022 CLINICAL DATA:  Fall with laceration to right cheek EXAM: CT HEAD WITHOUT CONTRAST CT MAXILLOFACIAL WITHOUT CONTRAST CT CERVICAL SPINE WITHOUT CONTRAST TECHNIQUE: Multidetector CT imaging of the head, cervical spine, and maxillofacial structures were performed using the standard protocol without intravenous contrast. Multiplanar CT image reconstructions of the cervical spine and maxillofacial structures were also generated. RADIATION DOSE REDUCTION: This exam was performed according to the departmental dose-optimization program which includes automated exposure control, adjustment of the mA and/or kV according to patient size and/or use of iterative reconstruction technique. COMPARISON:  CT 08/30/2021 FINDINGS: CT HEAD FINDINGS Brain: No acute territorial infarction, hemorrhage or intracranial mass. Patchy white matter hypodensity likely chronic small vessel ischemic change. The ventricles are nonenlarged. Vascular: No hyperdense vessels.  Carotid vascular calcification Skull: Normal. Negative for fracture or focal lesion. Other: Opacified right maxillary sinus. Mucosal thickening in the ethmoid sinuses CT MAXILLOFACIAL FINDINGS Osseous: Mastoid air cells are clear. Mandibular heads are normally  position. No mandibular fracture. Pterygoid plates and zygomatic arches appear intact. No acute nasal bone fracture Orbits: Negative. No traumatic or inflammatory finding. Sinuses: Completely opacified right maxillary sinus. Moderate mucosal thickening in the ethmoid sinuses. No sinus wall fracture. Soft tissues: Moderate to large right periorbital and infraorbital soft tissue hematoma with small foci of gas consistent with laceration CT CERVICAL SPINE FINDINGS Alignment: Straightening of the cervical spine. No subluxation. Facet alignment within normal limits. Skull base and vertebrae:  Vertebral body heights are maintained. Suspected nondisplaced fracture lucency involving the anterior inferior corner of the C2 vertebral body, best seen on sagittal images. Correlate is seen on axial series 9, image 28. No other discrete fractures are visualized. Soft tissues and spinal canal: No significant prevertebral soft tissue enlargement. Disc levels: No abnormal widening or narrowing of C2-C3 disc space. There are multilevel degenerative changes. Moderate severe disc space narrowing and degenerative change C4-C5, C5-C6, C6-C7 and C7-T1. Facet degenerative changes at multiple levels with foraminal narrowing Upper chest: Negative. Other: None IMPRESSION: 1. No CT evidence for acute intracranial abnormality. Patchy white matter hypodensity likely chronic small vessel ischemic change. 2. Findings consistent with nondisplaced teardrop type fracture involving the anterior inferior aspect of the C2 vertebral body. 3. Multilevel moderate severe degenerative changes Critical Value/emergent results were called by telephone at the time of interpretation on 10/01/2022 at 5:56 pm to provider [facility] , who verbally acknowledged these results. Electronically Signed   By: Donavan Foil M.D.   On: 10/01/2022 17:56   CT Maxillofacial WO CM  Result Date: 10/01/2022 CLINICAL DATA:  Fall with laceration to right cheek EXAM: CT HEAD WITHOUT CONTRAST CT MAXILLOFACIAL WITHOUT CONTRAST CT CERVICAL SPINE WITHOUT CONTRAST TECHNIQUE: Multidetector CT imaging of the head, cervical spine, and maxillofacial structures were performed using the standard protocol without intravenous contrast. Multiplanar CT image reconstructions of the cervical spine and maxillofacial structures were also generated. RADIATION DOSE REDUCTION: This exam was performed according to the departmental dose-optimization program which includes automated exposure control, adjustment of the mA and/or kV according to patient size and/or use of iterative  reconstruction technique. COMPARISON:  CT 08/30/2021 FINDINGS: CT HEAD FINDINGS Brain: No acute territorial infarction, hemorrhage or intracranial mass. Patchy white matter hypodensity likely chronic small vessel ischemic change. The ventricles are nonenlarged. Vascular: No hyperdense vessels.  Carotid vascular calcification Skull: Normal. Negative for fracture or focal lesion. Other: Opacified right maxillary sinus. Mucosal thickening in the ethmoid sinuses CT MAXILLOFACIAL FINDINGS Osseous: Mastoid air cells are clear. Mandibular heads are normally position. No mandibular fracture. Pterygoid plates and zygomatic arches appear intact. No acute nasal bone fracture Orbits: Negative. No traumatic or inflammatory finding. Sinuses: Completely opacified right maxillary sinus. Moderate mucosal thickening in the ethmoid sinuses. No sinus wall fracture. Soft tissues: Moderate to large right periorbital and infraorbital soft tissue hematoma with small foci of gas consistent with laceration CT CERVICAL SPINE FINDINGS Alignment: Straightening of the cervical spine. No subluxation. Facet alignment within normal limits. Skull base and vertebrae: Vertebral body heights are maintained. Suspected nondisplaced fracture lucency involving the anterior inferior corner of the C2 vertebral body, best seen on sagittal images. Correlate is seen on axial series 9, image 28. No other discrete fractures are visualized. Soft tissues and spinal canal: No significant prevertebral soft tissue enlargement. Disc levels: No abnormal widening or narrowing of C2-C3 disc space. There are multilevel degenerative changes. Moderate severe disc space narrowing and degenerative change C4-C5, C5-C6, C6-C7 and C7-T1. Facet degenerative changes at multiple levels with  foraminal narrowing Upper chest: Negative. Other: None IMPRESSION: 1. No CT evidence for acute intracranial abnormality. Patchy white matter hypodensity likely chronic small vessel ischemic  change. 2. Findings consistent with nondisplaced teardrop type fracture involving the anterior inferior aspect of the C2 vertebral body. 3. Multilevel moderate severe degenerative changes Critical Value/emergent results were called by telephone at the time of interpretation on 10/01/2022 at 5:56 pm to provider [facility] , who verbally acknowledged these results. Electronically Signed   By: Donavan Foil M.D.   On: 10/01/2022 17:56   DG Chest 1 View  Result Date: 10/01/2022 CLINICAL DATA:  [place] EXAM: CHEST  1 VIEW COMPARISON:  08/30/2021 FINDINGS: Single frontal view of the chest demonstrates a right chest wall port via internal jugular approach, tip overlying the atriocaval junction. Cardiac silhouette is unremarkable. No acute airspace disease, effusion, or pneumothorax. There are no acute bony abnormalities. IMPRESSION: 1. No acute intrathoracic process. Electronically Signed   By: Randa Ngo M.D.   On: 10/01/2022 17:54   DG Knee Complete 4 Views Left  Result Date: 10/01/2022 CLINICAL DATA:  [place], left leg deformity EXAM: LEFT KNEE - COMPLETE 4+ VIEW COMPARISON:  12/03/2016 FINDINGS: Frontal, bilateral oblique, and cross-table lateral views of the left knee are obtained. No acute displaced fracture, subluxation, or dislocation. Moderate 3 compartmental osteoarthritis greatest in the patellofemoral compartment. Synovial osteochondromatosis again noted. Small joint effusion. Prepatellar soft tissue edema. IMPRESSION: 1. No acute displaced fracture. 2. Moderate osteoarthritis with likely reactive joint effusion. 3. Prepatellar soft tissue edema. Electronically Signed   By: Randa Ngo M.D.   On: 10/01/2022 17:53   DG Hip Unilat With Pelvis 2-3 Views Left  Result Date: 10/01/2022 CLINICAL DATA:  [place], left leg deformity EXAM: DG HIP (WITH OR WITHOUT PELVIS) 2-3V LEFT COMPARISON:  None Available. FINDINGS: Frontal view of the pelvis as well as a frontal and cross-table lateral view of the left  hip are obtained. There is internal rotation of the left hip, with no acute fracture, subluxation, or dislocation. Symmetrical bilateral hip osteoarthritis. Soft tissues are unremarkable. Sacroiliac joints are normal. IMPRESSION: 1. No acute displaced fracture. If fracture remains a clinical concern, CT or MRI be performed. 2. Symmetrical bilateral hip osteoarthritis. Electronically Signed   By: Randa Ngo M.D.   On: 10/01/2022 17:52   CT HIP LEFT WO CONTRAST  Result Date: 10/01/2022 CLINICAL DATA:  Blunt facial trauma, fall striking bed rail. Shortening of left leg. EXAM: CT OF THE LEFT HIP WITHOUT CONTRAST TECHNIQUE: Multidetector CT imaging of the left hip was performed according to the standard protocol. Multiplanar CT image reconstructions were also generated. RADIATION DOSE REDUCTION: This exam was performed according to the departmental dose-optimization program which includes automated exposure control, adjustment of the mA and/or kV according to patient size and/or use of iterative reconstruction technique. COMPARISON:  Radiographs 10/01/2022 FINDINGS: Bones/Joint/Cartilage Moderate to prominent loss of articular space in the left hip with marginal spurring along the acetabulum and femoral head. No fracture or acute bony findings identified. Ligaments Suboptimally assessed by CT. Muscles and Tendons Unremarkable Soft tissues Arterial atherosclerosis. IMPRESSION: 1. No fracture or acute bony findings. 2. Moderate to prominent left hip osteoarthritis. 3. Arterial atherosclerosis. Electronically Signed   By: Van Clines M.D.   On: 10/01/2022 17:48     Data Reviewed: Relevant notes from primary care and specialist visits, past discharge summaries as available in EHR, including Care Everywhere. Prior diagnostic testing as pertinent to current admission diagnoses Updated medications and problem lists  for reconciliation ED course, including vitals, labs, imaging, treatment and response to  treatment Triage notes, nursing and pharmacy notes and ED provider's notes Notable results as noted in HPI   Assessment and Plan: * Closed fracture of C2 vertebra (HCC) Contusion of cervical cord C4-5 secondary to accidental fall Discussed with Dr. Cari Caraway who will take patient to the OR at 7:30 AM on 10/27 for ACDF Continue Aspen collar N.p.o. from midnight Keep MAP over 80 Holding antihypertensives Holding DVT chemoprophylaxis  Left hip pain Secondary to accidental fall X-ray hip showing no fracture Pain control  Laceration of cheek without complication, left, initial encounter Sutured in the ED Wound care  Stage 3a chronic kidney disease ([place]) Creatinine 1.3, up from baseline of 1.17.  Will give fluid bolus  Hyperkalemia Potassium 5.7 We will give Veltassa x1 dose  Simple chronic bronchitis (HCC) DuoNebs as needed Trelegy Ellipta  Uncontrolled type 2 diabetes mellitus with hyperglycemia, with long-term current use of insulin (HCC) Sliding scale insulin coverage Basal insulin  Hypertension Holding antihypertensives due to need to keep MAP over 80 perfusion to the cervical cord    DVT prophylaxis: SCD  Consults: neurosurgery, Dr. Cari Caraway  Advance Care Planning: full code  Family Communication: none  Disposition Plan: Back to previous home environment  Severity of Illness: The appropriate patient status for this patient is INPATIENT. Inpatient status is judged to be reasonable and necessary in order to provide the required intensity of service to ensure the patient's safety. The patient's presenting symptoms, physical exam findings, and initial radiographic and laboratory data in the context of their chronic comorbidities is felt to place them at high risk for further clinical deterioration. Furthermore, it is not anticipated that the patient will be medically stable for discharge from the hospital within 2 midnights of admission.   * I certify that at  the point of admission it is my clinical judgment that the patient will require inpatient hospital care spanning beyond 2 midnights from the point of admission due to high intensity of service, high risk for further deterioration and high frequency of surveillance required.*  Author: Athena Masse, MD 10/01/2022 9:59 PM  For on call review [email or web address].

## 2022-10-01 NOTE — Assessment & Plan Note (Addendum)
Sliding scale insulin coverage Basal insulin

## 2022-10-01 NOTE — Assessment & Plan Note (Addendum)
Blood pressure within goal. -Continue low-dose amlodipine

## 2022-10-01 NOTE — ED Triage Notes (Signed)
Daughter heard pt fall who hit her head against bed rail. Laceration to right cheek, shortening and rotation of left leg, and bilateral shoulder pain. 150 mcg fentanyl by EMS for pain 10/10. VS were stable for EMS.

## 2022-10-01 NOTE — ED Notes (Signed)
Dorsal pedal pulses felt bilaterally.

## 2022-10-01 NOTE — Assessment & Plan Note (Addendum)
Resolved with Veltassa. -Continue to monitor

## 2022-10-01 NOTE — ED Provider Notes (Signed)
[facility] Provider Note    Event Date/Time   First MD Initiated Contact with Patient 10/01/22 [phone]     (approximate)   History   Fall (Head was hit on bed rail during fall)   HPI  Janice Short is a 61 y.o. female with a past medical history of spinal stenosis, diabetes, metastatic intrahepatic cholangiocarcinoma, hypertension, hyperlipidemia who presents today for evaluation after a mechanical fall.  Patient reports that she tripped on something, fell, striking her face on a railing, and landing on her stomach and then her left hip.  She was unable to get herself back up.  Her daughter heard her fall, and immediately rushed to her assistance.  There was no loss of consciousness.  EMS noted a shortened and externally rotated hip and a large laceration to her face.  They gave her 150 mcg of fentanyl.  Patient denies nausea or vomiting.  She denies headache or neck pain.  She does report that she has numbness and tingling in her left arm which she attributes to laying on her arm for a long time.  She notes that she was laying on both of her arms.  She has not had any nausea or vomiting.  She denies taking anticoagulation.  She denies chest pain or abdominal pain.  She denies shortness of breath.  She denies headache or vision changes.  Patient Active Problem List   Diagnosis Date Noted   Moderate protein-calorie malnutrition ([place]) 09/25/2022   Simple chronic bronchitis ([place]) 09/25/2022   Tobacco use 09/25/2022   Rectal polyp    Polyp of sigmoid colon    Polyp of duodenum    Intrahepatic cholangiocarcinoma ([place]) 04/12/2021   Goals of care, counseling/discussion 04/12/2021   Mild episode of recurrent major depressive disorder ([place]) 02/08/2019   Dyslipidemia associated with type 2 diabetes mellitus ([place]) 12/17/2017   Spinal stenosis 10/15/2016   Diabetic polyneuropathy associated with type 2 diabetes mellitus ([place]) 07/14/2016   Chronic midline low back pain with  sciatica 06/22/2016   Primary osteoarthritis of both knees 06/22/2016   Carpal tunnel syndrome 07/01/2015   Cervical radiculitis 07/01/2015   Corn or callus 07/01/2015   Impingement syndrome of shoulder 07/01/2015   Microalbuminuria 07/01/2015   Tenosynovitis of wrist 07/01/2015   Athlete's foot 08/05/2009   Vitamin D deficiency 01/08/2009   Allergic rhinitis 12/28/2008   Lumbosacral neuritis 08/11/2007          Physical Exam   Triage Vital Signs: ED Triage Vitals  Enc Vitals Group     BP --      Pulse --      Resp --      Temp 10/01/22 1630 99.9 F (37.7 C)     Temp Source 10/01/22 1630 Oral     SpO2 --      Weight 10/01/22 1629 176 lb 5.9 oz (80 kg)     Height 10/01/22 1629 5\' 7"  (1.702 m)     Head Circumference --      Peak Flow --      Pain Score 10/01/22 1626 3     Pain Loc --      Pain Edu? --      Excl. in [place]? --     Most recent vital signs: Vitals:   10/01/22 1630 10/01/22 1845  BP: 129/61 123/63  Pulse: (!) 103   Resp: (!) 24   Temp: 99.9 F (37.7 C)   SpO2: 97%     Physical  Exam Vitals and nursing note reviewed.  Constitutional:      General: Awake and alert. No acute distress.    Appearance: Normal appearance. The patient is normal weight.  HENT:     Head: Normocephalic.  Swelling/hematoma noted to right periorbital area with a 3 cm laceration to her cheek.    Mouth: Mucous membranes are moist.  Eyes:     General: PERRL. Normal EOMs        Right eye: No discharge.        Left eye: No discharge.  Infraorbital swelling as described above, though normal extraocular movements, no proptosis    Conjunctiva/sclera: Conjunctivae normal.  Cardiovascular:     Rate and Rhythm: Normal rate and regular rhythm.     Pulses: Normal pulses.     Heart sounds: Normal heart sounds Pulmonary:     Effort: Pulmonary effort is normal. No respiratory distress.     Breath sounds: Normal breath sounds.  No chest wall tenderness or ecchymosis Abdominal:      Abdomen is soft. There is no abdominal tenderness. No rebound or guarding. No distention.  No ecchymosis Musculoskeletal:        General: No swelling. Normal range of motion.     Cervical back: Midline cervical spine tenderness.  Able to lift her right arm over her head, normal strength and sensation throughout her right arm.  Left upper extremity: Unable to squeeze my hand with her left hand, unable to make a fist or move her fingers.  She is able to flex and extend at the elbow.  Cannot flex and extend at wrist. Pelvis stable.  Shortened and rotated left lower extremity.  Tenderness to left knee and left hip.  Normal 2+ pedal pulses.  Sensation intact light touch throughout.  Able to actively range her right leg, unable to actively range her left leg Normal 2+ pedal pulses Skin:    General: Skin is warm and dry.     Capillary Refill: Capillary refill takes less than 2 seconds.     Findings: No rash.  Neurological:     Mental Status: The patient is awake and alert.  Face symmetric. LUE weakness/paresthesias as described above     ED Results / Procedures / Treatments   Labs (all labs ordered are listed, but only abnormal results are displayed) Labs Reviewed  CBC WITH DIFFERENTIAL/PLATELET - Abnormal; Notable for the following components:      Result Value   WBC 12.2 (*)    RBC 3.13 (*)    Hemoglobin 9.7 (*)    HCT 31.0 (*)    RDW 20.3 (*)    Platelets 97 (*)    Neutro Abs 9.0 (*)    Monocytes Absolute 1.7 (*)    All other components within normal limits  BASIC METABOLIC PANEL - Abnormal; Notable for the following components:   Potassium 5.7 (*)    CO2 21 (*)    Glucose, Bld 243 (*)    Creatinine, Ser 1.30 (*)    Calcium 7.9 (*)    GFR, Estimated 46 (*)    All other components within normal limits  SARS CORONAVIRUS 2 BY RT PCR     EKG     RADIOLOGY I independently reviewed and interpreted imaging and agree with radiologists findings.     PROCEDURES:  Critical  Care performed:   Marland KitchenMarland KitchenLaceration Repair  Date/Time: 10/01/2022 6:37 PM  Performed by: Marquette Old, PA-C Authorized by: Marquette Old, PA-C   Consent:  Consent obtained:  Verbal   Consent given by:  Patient   Risks, benefits, and alternatives were discussed: yes     Risks discussed:  Infection, need for additional repair, nerve damage, poor wound healing, poor cosmetic result, pain, retained foreign body, tendon damage and vascular damage   Alternatives discussed:  No treatment Universal protocol:    Procedure explained and questions answered to patient or proxy's satisfaction: yes     Relevant documents present and verified: yes     Test results available: yes     Imaging studies available: yes     Required blood products, implants, devices, and special equipment available: yes     Site/side marked: yes     Immediately prior to procedure, a time out was called: yes     Patient identity confirmed:  Verbally with patient Anesthesia:    Anesthesia method:  Local infiltration   Local anesthetic:  Lidocaine 2% WITH epi Laceration details:    Location:  Face   Face location:  R cheek   Length (cm):  3   Depth (mm):  4 Pre-procedure details:    Preparation:  Patient was prepped and draped in usual sterile fashion and imaging obtained to evaluate for foreign bodies Exploration:    Limited defect created (wound extended): no     Hemostasis achieved with:  Direct pressure   Imaging outcome: foreign body not noted     Wound exploration: wound explored through full range of motion and entire depth of wound visualized     Wound extent: fascia not violated, no foreign body, no signs of injury, no tendon damage, no underlying fracture and no vascular damage     Contaminated: no   Treatment:    Area cleansed with:  Saline   Amount of cleaning:  Standard   Irrigation solution:  Sterile saline   Irrigation method:  Pressure wash and syringe   Visualized foreign bodies/material removed:  no     Debridement:  None   Undermining:  None   Scar revision: no   Skin repair:    Repair method:  Sutures   Suture size:  5-0   Suture material:  Nylon   Suture technique:  Simple interrupted   Number of sutures:  5 Repair type:    Repair type:  Simple Post-procedure details:    Dressing:  Open (no dressing)   Procedure completion:  Tolerated well, no immediate complications    MEDICATIONS ORDERED IN ED: Medications  morphine (PF) 4 MG/ML injection 4 mg (4 mg Intravenous Given 10/01/22 1803)  lidocaine-EPINEPHrine (XYLOCAINE W/EPI) 2 %-1:100000 (with pres) injection 20 mL (20 mLs Intradermal Given 10/01/22 1803)     IMPRESSION / MDM / [place] / ED COURSE  I reviewed the triage vital signs and the nursing notes.   Differential diagnosis includes, but is not limited to, hip fracture, dislocation, intracranial hemorrhage, cervical spine injury, facial fracture.  Initial concern was for her left hip given that it was externally rotated and shortened, though patient really did not have too much tenderness to her hip and mostly had tenderness at her knee.  She was brought immediately to CAT scan and x-ray.  While she was in the CAT scan, the radiology tech asked me if he could add on a CT of her hip given that her hip looked broken, and I agreed to this.  Her x-rays ended up being negative for any acute bony injury, and CT scan did not reveal occult fracture  of her hip, nor intracranial hemorrhage or facial fracture.  I did receive a call from the radiologist, as patient was found to have a nondisplaced teardrop type fracture involving the anterior inferior aspect of the C2 vertebral body.  Extension type injury is consistent with how patient describes her fall.  I immediately placed the patient in an Aspen collar and ordered an MRI and discussed with Dr. Cari Caraway who agrees with this plan of care.  He is not convinced that this is a teardrop type fracture, though MRI will be  revealing.  I am concerned for central cord syndrome given her paresthesias, bilateral shoulder pain, and decreased grip strength of her affected side, though other possibilities as suggested by Dr. Cari Caraway is a peripheral neuropathy given that she was laying on her arm.  Patient's wound was anesthetized and irrigated, and closed with sutures.  Patient agrees with plan for suture closure.  She declined tetanus vaccination, reports that her tetanus is up-to-date.  Patient was passed off to Dr. Cheri Fowler pending MRI and final disposition.   Patient's presentation is most consistent with acute presentation with potential threat to life or bodily function.  Clinical Course as of 10/01/22 1909  Thu Oct 01, 2022  1754 Called by radiologist, who reports teardrop type fx of C2 [JP]  1803 Placed in aspen collar [JP]  1811 Discussed with Dr. Cari Caraway who agrees with plan for MRI cervical spine and an Aspen collar, will touch base again after MRI [JP]  1904 Relayed to RN that MAP needs to stay >80 per Dr. Cari Caraway. Will tell me if drops below this [JP]    Clinical Course User Index [JP] Karyna Bessler, Clarnce Flock, PA-C     FINAL CLINICAL IMPRESSION(S) / ED DIAGNOSES   Final diagnoses:  Fall, initial encounter  Facial laceration, initial encounter  Other closed nondisplaced fracture of second cervical vertebra, initial encounter ([place])  Injury of head, initial encounter     Rx / DC Orders   ED Discharge Orders     None        Note:  This document was prepared using Dragon voice recognition software and may include unintentional dictation errors.   Emeline Gins 10/01/22 Verdis Frederickson, MD 10/01/22 2356

## 2022-10-01 NOTE — Assessment & Plan Note (Addendum)
Contusion of cervical cord C4-5 secondary to accidental fall Discussed with Dr. Cari Caraway and she was taken to the OR for ACDF on 10/27. Remained stable since procedure. PT/OT are recommending CIR-now had insurance authorization. -Continue with pain management

## 2022-10-01 NOTE — Assessment & Plan Note (Addendum)
AKI with history of stage IIIa.  Creatinine currently stable at 1.41  up from baseline of 1.17.   -We will give another liter of fluid -Monitor renal function

## 2022-10-01 NOTE — Assessment & Plan Note (Signed)
Secondary to accidental fall X-ray hip showing no fracture Pain control

## 2022-10-02 ENCOUNTER — Inpatient Hospital Stay: Payer: Medicare Other | Admitting: Anesthesiology

## 2022-10-02 ENCOUNTER — Inpatient Hospital Stay: Payer: Medicare Other

## 2022-10-02 ENCOUNTER — Encounter
Admission: EM | Disposition: A | Discharge status: Other Acute Inpatient Hospital | Payer: Self-pay | Source: Home / Self Care | Attending: Family Medicine

## 2022-10-02 ENCOUNTER — Encounter: Payer: Self-pay | Admitting: Internal Medicine

## 2022-10-02 ENCOUNTER — Other Ambulatory Visit: Payer: Self-pay

## 2022-10-02 ENCOUNTER — Ambulatory Visit: Payer: Medicare Other

## 2022-10-02 DIAGNOSIS — S14109A Unspecified injury at unspecified level of cervical spinal cord, initial encounter: Secondary | ICD-10-CM | POA: Diagnosis not present

## 2022-10-02 DIAGNOSIS — M532X2 Spinal instabilities, cervical region: Secondary | ICD-10-CM | POA: Diagnosis not present

## 2022-10-02 DIAGNOSIS — M4802 Spinal stenosis, cervical region: Secondary | ICD-10-CM

## 2022-10-02 DIAGNOSIS — S12101A Unspecified nondisplaced fracture of second cervical vertebra, initial encounter for closed fracture: Secondary | ICD-10-CM | POA: Diagnosis not present

## 2022-10-02 DIAGNOSIS — W06XXXA Fall from bed, initial encounter: Secondary | ICD-10-CM

## 2022-10-02 DIAGNOSIS — S14129A Central cord syndrome at unspecified level of cervical spinal cord, initial encounter: Secondary | ICD-10-CM | POA: Diagnosis not present

## 2022-10-02 HISTORY — PX: ANTERIOR CERVICAL DECOMP/DISCECTOMY FUSION: SHX1161

## 2022-10-02 LAB — ABO/RH: ABO/RH(D): B POS

## 2022-10-02 LAB — HIV ANTIBODY (ROUTINE TESTING W REFLEX): HIV Screen 4th Generation wRfx: NONREACTIVE

## 2022-10-02 LAB — BASIC METABOLIC PANEL
Anion gap: 5 (ref 5–15)
BUN: 13 mg/dL (ref 8–23)
CO2: 21 mmol/L — ABNORMAL LOW (ref 22–32)
Calcium: 8 mg/dL — ABNORMAL LOW (ref 8.9–10.3)
Chloride: 114 mmol/L — ABNORMAL HIGH (ref 98–111)
Creatinine, Ser: 1.25 mg/dL — ABNORMAL HIGH (ref 0.44–1.00)
GFR, Estimated: 48 mL/min — ABNORMAL LOW (ref >60)
Glucose, Bld: 131 mg/dL — ABNORMAL HIGH (ref 70–99)
Potassium: 4.5 mmol/L (ref 3.5–5.1)
Sodium: 140 mmol/L (ref 135–145)

## 2022-10-02 LAB — GLUCOSE, CAPILLARY
Glucose-Capillary: 128 mg/dL — ABNORMAL HIGH (ref 70–99)
Glucose-Capillary: 151 mg/dL — ABNORMAL HIGH (ref 70–99)
Glucose-Capillary: 164 mg/dL — ABNORMAL HIGH (ref 70–99)
Glucose-Capillary: 215 mg/dL — ABNORMAL HIGH (ref 70–99)
Glucose-Capillary: 285 mg/dL — ABNORMAL HIGH (ref 70–99)
Glucose-Capillary: 315 mg/dL — ABNORMAL HIGH (ref 70–99)

## 2022-10-02 LAB — TYPE AND SCREEN
ABO/RH(D): B POS
Antibody Screen: NEGATIVE

## 2022-10-02 SURGERY — ANTERIOR CERVICAL DECOMPRESSION/DISCECTOMY FUSION 1 LEVEL
Anesthesia: General

## 2022-10-02 MED ORDER — GLYCOPYRROLATE 0.2 MG/ML IJ SOLN
INTRAMUSCULAR | Status: DC | PRN
  Administered 2022-10-02: .2 mg via INTRAVENOUS

## 2022-10-02 MED ORDER — CEFAZOLIN SODIUM-DEXTROSE 2-4 GM/100ML-% IV SOLN
INTRAVENOUS | Status: AC
  Filled 2022-10-02: qty 100

## 2022-10-02 MED ORDER — ONDANSETRON HCL 4 MG/2ML IJ SOLN
INTRAMUSCULAR | Status: AC
  Filled 2022-10-02: qty 2

## 2022-10-02 MED ORDER — DEXAMETHASONE SODIUM PHOSPHATE 10 MG/ML IJ SOLN
INTRAMUSCULAR | Status: DC | PRN
  Administered 2022-10-02: 10 mg via INTRAVENOUS

## 2022-10-02 MED ORDER — SURGIFLO WITH THROMBIN (HEMOSTATIC MATRIX KIT) OPTIME
TOPICAL | Status: DC | PRN
  Administered 2022-10-02: 1 via TOPICAL

## 2022-10-02 MED ORDER — REMIFENTANIL HCL 1 MG IV SOLR
INTRAVENOUS | Status: DC | PRN
  Administered 2022-10-02: .1 ug/kg/min via INTRAVENOUS

## 2022-10-02 MED ORDER — FENTANYL CITRATE (PF) 100 MCG/2ML IJ SOLN
INTRAMUSCULAR | Status: AC
  Filled 2022-10-02: qty 2

## 2022-10-02 MED ORDER — ACETAMINOPHEN 10 MG/ML IV SOLN
INTRAVENOUS | Status: DC | PRN
  Administered 2022-10-02: 1000 mg via INTRAVENOUS

## 2022-10-02 MED ORDER — ACETAMINOPHEN 10 MG/ML IV SOLN
INTRAVENOUS | Status: AC
  Filled 2022-10-02: qty 100

## 2022-10-02 MED ORDER — FENTANYL CITRATE (PF) 100 MCG/2ML IJ SOLN
INTRAMUSCULAR | Status: DC | PRN
  Administered 2022-10-02: 50 ug via INTRAVENOUS

## 2022-10-02 MED ORDER — PROPOFOL 10 MG/ML IV BOLUS
INTRAVENOUS | Status: DC | PRN
  Administered 2022-10-02: 100 mg via INTRAVENOUS

## 2022-10-02 MED ORDER — PROMETHAZINE HCL 25 MG/ML IJ SOLN: 6.2500 mg | INTRAMUSCULAR | Status: DC | PRN

## 2022-10-02 MED ORDER — CEFAZOLIN SODIUM-DEXTROSE 2-3 GM-%(50ML) IV SOLR
INTRAVENOUS | Status: DC | PRN
  Administered 2022-10-02: 2 g via INTRAVENOUS

## 2022-10-02 MED ORDER — PROPOFOL 500 MG/50ML IV EMUL
INTRAVENOUS | Status: DC | PRN
  Administered 2022-10-02: 150 ug/kg/min via INTRAVENOUS

## 2022-10-02 MED ORDER — OXYCODONE HCL 5 MG PO TABS
5.0000 mg | ORAL_TABLET | ORAL | Status: DC | PRN
  Administered 2022-10-03 – 2022-10-07 (×17): 5 mg via ORAL
  Filled 2022-10-02 (×17): qty 1

## 2022-10-02 MED ORDER — SUCCINYLCHOLINE CHLORIDE 200 MG/10ML IV SOSY
PREFILLED_SYRINGE | INTRAVENOUS | Status: DC | PRN
  Administered 2022-10-02: 100 mg via INTRAVENOUS

## 2022-10-02 MED ORDER — PHENYLEPHRINE HCL (PRESSORS) 10 MG/ML IV SOLN
INTRAVENOUS | Status: DC | PRN
  Administered 2022-10-02 (×2): 160 ug via INTRAVENOUS

## 2022-10-02 MED ORDER — SUCCINYLCHOLINE CHLORIDE 200 MG/10ML IV SOSY
PREFILLED_SYRINGE | INTRAVENOUS | Status: AC
  Filled 2022-10-02: qty 10

## 2022-10-02 MED ORDER — BUPIVACAINE-EPINEPHRINE (PF) 0.5% -1:200000 IJ SOLN
INTRAMUSCULAR | Status: AC
  Filled 2022-10-02: qty 30

## 2022-10-02 MED ORDER — PROPOFOL 10 MG/ML IV BOLUS
INTRAVENOUS | Status: AC
  Filled 2022-10-02: qty 20

## 2022-10-02 MED ORDER — LIDOCAINE HCL 4 % MT SOLN
OROMUCOSAL | Status: DC | PRN
  Administered 2022-10-02: 4 mL via TOPICAL

## 2022-10-02 MED ORDER — OXYCODONE HCL 5 MG/5ML PO SOLN: 5.0000 mg | Freq: Once | ORAL | Status: DC | PRN

## 2022-10-02 MED ORDER — SODIUM CHLORIDE 0.9 % IV SOLN: INTRAVENOUS | Status: DC | PRN

## 2022-10-02 MED ORDER — SODIUM CHLORIDE (PF) 0.9 % IJ SOLN
INTRAMUSCULAR | Status: AC
  Filled 2022-10-02: qty 20

## 2022-10-02 MED ORDER — PHENYLEPHRINE HCL-NACL 20-0.9 MG/250ML-% IV SOLN
INTRAVENOUS | Status: AC
  Filled 2022-10-02: qty 250

## 2022-10-02 MED ORDER — METHOCARBAMOL 500 MG PO TABS
500.0000 mg | ORAL_TABLET | Freq: Four times a day (QID) | ORAL | Status: DC | PRN
  Administered 2022-10-05 – 2022-10-06 (×5): 500 mg via ORAL
  Filled 2022-10-02 (×5): qty 1

## 2022-10-02 MED ORDER — FENTANYL CITRATE (PF) 100 MCG/2ML IJ SOLN
25.0000 ug | INTRAMUSCULAR | Status: DC | PRN
  Administered 2022-10-02 (×4): 25 ug via INTRAVENOUS

## 2022-10-02 MED ORDER — GLYCOPYRROLATE 0.2 MG/ML IJ SOLN
INTRAMUSCULAR | Status: AC
  Filled 2022-10-02: qty 1

## 2022-10-02 MED ORDER — OXYCODONE HCL 5 MG PO TABS: 5.0000 mg | ORAL_TABLET | Freq: Once | ORAL | Status: DC | PRN

## 2022-10-02 MED ORDER — BUPIVACAINE-EPINEPHRINE (PF) 0.5% -1:200000 IJ SOLN
INTRAMUSCULAR | Status: DC | PRN
  Administered 2022-10-02: 5 mL

## 2022-10-02 MED ORDER — 0.9 % SODIUM CHLORIDE (POUR BTL) OPTIME
TOPICAL | Status: DC | PRN
  Administered 2022-10-02: 1000 mL

## 2022-10-02 MED ORDER — DROPERIDOL 2.5 MG/ML IJ SOLN: 0.6250 mg | Freq: Once | INTRAMUSCULAR | Status: DC | PRN

## 2022-10-02 MED ORDER — LIDOCAINE HCL (CARDIAC) PF 100 MG/5ML IV SOSY
PREFILLED_SYRINGE | INTRAVENOUS | Status: DC | PRN
  Administered 2022-10-02: 30 mg via INTRAVENOUS

## 2022-10-02 MED ORDER — PROPOFOL 1000 MG/100ML IV EMUL
INTRAVENOUS | Status: AC
  Filled 2022-10-02: qty 100

## 2022-10-02 MED ORDER — PHENYLEPHRINE HCL-NACL 20-0.9 MG/250ML-% IV SOLN
INTRAVENOUS | Status: DC | PRN
  Administered 2022-10-02: 50 ug/min via INTRAVENOUS

## 2022-10-02 MED ORDER — DEXAMETHASONE SODIUM PHOSPHATE 10 MG/ML IJ SOLN
INTRAMUSCULAR | Status: AC
  Filled 2022-10-02: qty 1

## 2022-10-02 MED ORDER — REMIFENTANIL HCL 1 MG IV SOLR
INTRAVENOUS | Status: AC
  Filled 2022-10-02: qty 1000

## 2022-10-02 MED ORDER — ACETAMINOPHEN 10 MG/ML IV SOLN: 1000.0000 mg | Freq: Once | INTRAVENOUS | Status: DC | PRN

## 2022-10-02 MED ORDER — MIDAZOLAM HCL 2 MG/2ML IJ SOLN
INTRAMUSCULAR | Status: AC
  Filled 2022-10-02: qty 2

## 2022-10-02 MED ORDER — MIDAZOLAM HCL 2 MG/2ML IJ SOLN
INTRAMUSCULAR | Status: DC | PRN
  Administered 2022-10-02 (×2): 1 mg via INTRAVENOUS

## 2022-10-02 MED ORDER — ONDANSETRON HCL 4 MG/2ML IJ SOLN
INTRAMUSCULAR | Status: DC | PRN
  Administered 2022-10-02: 4 mg via INTRAVENOUS

## 2022-10-02 MED ORDER — LIDOCAINE HCL (PF) 2 % IJ SOLN
INTRAMUSCULAR | Status: AC
  Filled 2022-10-02: qty 5

## 2022-10-02 SURGICAL SUPPLY — 55 items
ADH SKN CLS APL DERMABOND .7 (GAUZE/BANDAGES/DRESSINGS) ×1
AGENT HMST KT MTR STRL THRMB (HEMOSTASIS) ×1
BASIN KIT SINGLE STR (MISCELLANEOUS) ×1 IMPLANT
BASKET BONE COLLECTION (BASKET) IMPLANT
BONE CERV LORDOTIC 14.5X12X7 (Bone Implant) ×1 IMPLANT
BULB RESERV EVAC DRAIN JP 100C (MISCELLANEOUS) IMPLANT
BUR NEURO DRILL SOFT 3.0X3.8M (BURR) ×1 IMPLANT
DERMABOND ADVANCED .7 DNX12 (GAUZE/BANDAGES/DRESSINGS) ×1 IMPLANT
DRAIN CHANNEL JP 10F RND 20C F (MISCELLANEOUS) IMPLANT
DRAPE C ARM PK CFD 31 SPINE (DRAPES) ×1 IMPLANT
DRAPE LAPAROTOMY 77X122 PED (DRAPES) ×1 IMPLANT
DRAPE MICROSCOPE SPINE 48X150 (DRAPES) ×1 IMPLANT
DRAPE SURG 17X11 SM STRL (DRAPES) ×1 IMPLANT
ELECT REM PT RETURN 9FT ADLT (ELECTROSURGICAL) ×1
ELECTRODE REM PT RTRN 9FT ADLT (ELECTROSURGICAL) ×1 IMPLANT
FEE INTRAOP CADWELL SUPPLY NCS (MISCELLANEOUS) IMPLANT
FEE INTRAOP MONITOR IMPULS NCS (MISCELLANEOUS) IMPLANT
GLOVE BIOGEL PI IND STRL 6.5 (GLOVE) ×1 IMPLANT
GLOVE BIOGEL PI IND STRL 8.5 (GLOVE) ×1 IMPLANT
GLOVE SURG SYN 6.5 ES PF (GLOVE) ×2 IMPLANT
GLOVE SURG SYN 6.5 PF PI (GLOVE) ×1 IMPLANT
GLOVE SURG SYN 8.5  E (GLOVE) ×3
GLOVE SURG SYN 8.5 E (GLOVE) ×3 IMPLANT
GLOVE SURG SYN 8.5 PF PI (GLOVE) ×3 IMPLANT
GOWN SRG LRG LVL 4 IMPRV REINF (GOWNS) ×1 IMPLANT
GOWN SRG XL LVL 3 NONREINFORCE (GOWNS) ×1 IMPLANT
GOWN STRL NON-REIN TWL XL LVL3 (GOWNS) ×1
GOWN STRL REIN LRG LVL4 (GOWNS) ×1
GRAFT BNE SPCR VG2 14.5X12X7 (Bone Implant) IMPLANT
GRAFT DURAGEN MATRIX 1WX1L (Tissue) IMPLANT
INTRAOP CADWELL SUPPLY FEE NCS (MISCELLANEOUS)
INTRAOP DISP SUPPLY FEE NCS (MISCELLANEOUS)
INTRAOP MONITOR FEE IMPULS NCS (MISCELLANEOUS) ×1
INTRAOP MONITOR FEE IMPULSE (MISCELLANEOUS) ×1
KIT TURNOVER KIT A (KITS) ×1 IMPLANT
MANIFOLD NEPTUNE II (INSTRUMENTS) ×1 IMPLANT
NDL SAFETY ECLIP 18X1.5 (MISCELLANEOUS) ×1 IMPLANT
NS IRRIG 1000ML POUR BTL (IV SOLUTION) ×1 IMPLANT
PACK LAMINECTOMY NEURO (CUSTOM PROCEDURE TRAY) ×1 IMPLANT
PAD ARMBOARD 7.5X6 YLW CONV (MISCELLANEOUS) ×2 IMPLANT
PIN CASPAR 14 (PIN) ×1 IMPLANT
PIN CASPAR 14MM (PIN) ×2 IMPLANT
PLATE ANT CERV XTEND 1 LV 10 (Plate) IMPLANT
PUTTY DBX 1CC (Putty) IMPLANT
SCREW VAR 4.2 XD SELF DRILL 16 (Screw) IMPLANT
SPONGE KITTNER 5P (MISCELLANEOUS) ×1 IMPLANT
STAPLER SKIN PROX 35W (STAPLE) IMPLANT
SURGIFLO W/THROMBIN 8M KIT (HEMOSTASIS) ×1 IMPLANT
SUT V-LOC 90 ABS DVC 3-0 CL (SUTURE) ×1 IMPLANT
SUT VIC AB 3-0 SH 8-18 (SUTURE) ×1 IMPLANT
SYR 20ML LL LF (SYRINGE) ×1 IMPLANT
TAPE CLOTH 3X10 WHT NS LF (GAUZE/BANDAGES/DRESSINGS) ×3 IMPLANT
TRAP FLUID SMOKE EVACUATOR (MISCELLANEOUS) ×1 IMPLANT
TRAY FOLEY MTR SLVR 16FR STAT (SET/KITS/TRAYS/PACK) IMPLANT
WATER STERILE IRR 1000ML POUR (IV SOLUTION) ×2 IMPLANT

## 2022-10-02 NOTE — Progress Notes (Signed)
PROGRESS NOTE    Janice Short  WIO:[id number] DOB: 1961-10-03 DOA: 10/01/2022 PCP: Steele Sizer, MD   Brief Narrative:  This 61 years old female with PMH significant for IDDM, chronic bronchitis, intrahepatic cholangiocarcinoma on chemotherapy, last infusion on 09/18/2022 presented in the ED s/p accidental fall.  She was unable to stand up.  She sustained a laceration on the left cheek and complained of left hip pain and bilateral upper extremity numbness since the fall.  Imaging in the ED shows nondisplaced fracture of anterior inferior body of C2.  No epidural hematoma.  Multilevel degenerative changes most prominent at C4 and C5. Patient was placed on cervical collar. ED physician has spoken with neurosurgeon Dr. Cari Caraway who recommended ACDF for acute spinal contusion at C4-C5 due to severe spinal stenosis.  Patient is admitted for further management.  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 She suffered central cord injury causing central cord syndrome status post fall. Patient was evaluated by Dr. Cari Caraway who recommended ACDF. Continue cervical collar. Patient is s/p ACDF POD # 1 Continue adequate pain control.   Left hip pain: S/p accidental fall. X-ray hip shows no acute fracture. Continue adequate pain  control.  Cheek lacerations without complication: Laceration sutured in ED. Continue wound care.  CKD stage IIIa: Serum creatinine at baseline. Avoid nephrotoxic medications.   Hyperkalemia: Potassium 5.7. Lokelma x1 given. Recheck potassium    Chronic bronchitis: Continue home inhalers. DuoNebs as needed. Continue Trelegy Ellipta.   Type 2 diabetes with hyperglycemia: Sliding scale insulin coverage Continue Semglee 8 units at bedtime   Essential HTN: Resume home blood pressure medication when blood pressure stable.   DVT prophylaxis: SCDs Code Status: Full code Family Communication: No family at bedside Disposition Plan:  Status is: Inpatient Remains inpatient appropriate because:    Admitted s/p accidental fall with central cord injury causing central cord syndrome.  Neurosurgery consulted patient underwent s/p ACDF POD 1, PT and OT evaluation pending  Consultants:  Neurosurgery  Procedures: S/p ACDF Antimicrobials: None  Subjective: Patient was seen and examined at bedside.  Overnight events noted.  S/p ACDF POD 1. Patient is lying comfortably,  states she has pain in the neck.  Having lunch after the surgery.  Objective: Vitals:   10/02/22 1030 10/02/22 1045 10/02/22 1100 10/02/22 1125  BP: 118/68 123/63 128/67 135/68  Pulse: 90 87 83 81  Resp: 16 14 16 18   Temp:  98.8 F (37.1 C)  97.8 F (36.6 C)  TempSrc:    Oral  SpO2: 95% 96% 96% 97%  Weight:      Height:        Intake/Output Summary (Last 24 hours) at 10/02/2022 1341 Last data filed at 10/02/2022 1102 Gross per 24 hour  Intake 650 ml  Output 1000 ml  Net -350 ml   [employer]  10/01/22 1629 10/02/22 0716  Weight: 80 kg 80 kg    Examination:  General exam: Appears comfortable, not in any acute distress, deconditioned.   Lacerations noted on the neck and the left cheek Respiratory system: CTA bilaterally, respiratory for normal, RR 15 Cardiovascular system: S1 & S2 heard, regular  rate and rhythm, no murmur. Gastrointestinal system: Abdomen is soft, non tender, non distended, BS+ Central nervous system: Alert and oriented x 3. No focal neurological deficits. Back: Tenderness noted on the upper back. Extremities: No edema, no cyanosis, no clubbing. Skin: No rashes, lesions or ulcers Psychiatry: Judgement and insight appear normal. Mood & affect appropriate.     Data Reviewed: I have personally reviewed following labs and imaging studies  CBC: Recent Labs  Lab 10/01/22 1631  WBC 12.2*  NEUTROABS 9.0*  HGB 9.7*  HCT 31.0*  MCV 99.0  PLT 97*   Basic Metabolic Panel: Recent Labs  Lab 10/01/22 1755 10/02/22 0553  NA 136 140  K 5.7* 4.5  CL 110 114*  CO2 21* 21*  GLUCOSE 243* 131*  BUN 13 13  CREATININE 1.30* 1.25*  CALCIUM 7.9* 8.0*   GFR: Estimated Creatinine Clearance: 50.2 mL/min (A) (by C-G formula based on SCr of 1.25 mg/dL (H)). Liver Function Tests: No results for input(s): "AST", "ALT", "ALKPHOS", "BILITOT", "PROT", "ALBUMIN" in the last 168 hours. No results for input(s): "LIPASE", "AMYLASE" in the last 168 hours. No results for input(s): "AMMONIA" in the last 168 hours. Coagulation Profile: Recent Labs  Lab 10/01/22 2106  INR 1.2   Cardiac Enzymes: No results for input(s): "CKTOTAL", "CKMB", "CKMBINDEX", "TROPONINI" in the last 168 hours. BNP (last 3 results) No results for input(s): "PROBNP" in the last 8760 hours. HbA1C: No results for input(s): "HGBA1C" in the last 72 hours. CBG: Recent Labs  Lab 10/01/22 2303 10/02/22 0715 10/02/22 0952 10/02/22 1127  GLUCAP 263* 128* 151* 164*   Lipid Profile: No results for input(s): "CHOL", "HDL", "LDLCALC", "TRIG", "CHOLHDL", "LDLDIRECT" in the last 72 hours. Thyroid Function Tests: No results for input(s): "TSH", "T4TOTAL", "FREET4", "T3FREE", "THYROIDAB" in the last 72 hours. Anemia Panel: No results for input(s): "VITAMINB12", "FOLATE", "FERRITIN", "TIBC", "IRON", "RETICCTPCT"  in the last 72 hours. Sepsis Labs: No results for input(s): "PROCALCITON", "LATICACIDVEN" in the last 168 hours.  Recent Results (from the past 240 hour(s))  SARS Coronavirus 2 by RT PCR (hospital order, performed in [facility] hospital lab) *cepheid single result test* Anterior Nasal Swab     Status: None   Collection Time: 10/01/22  4:31 PM   Specimen: Anterior Nasal Swab  Result Value Ref Range Status   SARS Coronavirus 2 by RT PCR NEGATIVE NEGATIVE Final    Comment: (NOTE) SARS-CoV-2 target nucleic acids are NOT DETECTED.  The SARS-CoV-2 RNA is generally detectable in upper and lower respiratory specimens during the acute phase of infection. The lowest concentration of SARS-CoV-2 viral copies this assay can detect is 250 copies / mL. A negative result does not preclude SARS-CoV-2 infection and should not be used as the sole basis for treatment or other patient management decisions.  A negative result may occur with improper specimen collection / handling, submission of specimen other than nasopharyngeal swab, presence of viral mutation(s) within the areas targeted by this assay, and inadequate number of viral copies (<250 copies / mL). A negative result must be combined with clinical observations, patient history, and epidemiological information.  Fact Sheet for Patients:   https://www.patel.info/  Fact Sheet for Healthcare Providers: https://hall.com/  This test is not yet approved or  cleared by the [place] and has been authorized for detection and/or diagnosis of SARS-CoV-2 by FDA under an Emergency Use Authorization (EUA).  This EUA will remain in effect (meaning this test can be used) for the duration of the COVID-19 declaration under Section 564(b)(1) of the Act, 21 U.S.C. section 360bbb-3(b)(1), unless the authorization is terminated or revoked sooner.  Performed at [facility], [address]  [place], [place] [address]          Radiology Studies: DG Cervical Spine 2-3 Views  Result Date: 10/02/2022 CLINICAL DATA:  Provided history: Anterior cervical decompression/discectomy with fusion of C4-C5. Provided fluoroscopy time: 2 seconds EXAM: CERVICAL SPINE - 2-3 VIEW COMPARISON:  Cervical spine MRI 10/01/2022. Cervical spine CT 10/01/2022. FINDINGS: Three intraoperative fluoroscopic images of the cervical spine are submitted. On the initial lateral projection fluoroscopic image, a metallic surgical instrument projects anterior to the C4-C5 disc space. On the subsequent fluoroscopic images acquired at 9:23 a.m., ACDF hardware and an interbody device are present at the C4-C5 level. A known acute fracture of the C2 body was better appreciated on the prior cervical spine CT of 10/01/2022. Partially imaged ET tube. IMPRESSION: Three intraoperative fluoroscopic images of the cervical spine from C4-C5 ACDF, as described. Electronically Signed   By: Kellie Simmering D.O.   On: 10/02/2022 09:47   DG C-Arm 1-60 Min-No Report  Result Date: 10/02/2022 Fluoroscopy was utilized by the requesting physician.  No radiographic interpretation.   MR Cervical Spine Wo Contrast  Result Date: 10/01/2022 CLINICAL DATA:  Neck trauma.  Fall EXAM: MRI CERVICAL SPINE WITHOUT CONTRAST TECHNIQUE: Multiplanar, multisequence MR imaging of the cervical spine was performed. No intravenous contrast was administered. COMPARISON:  CT cervical spine 10/03/2022 FINDINGS: Alignment: Mild anterolisthesis C3-4. Straightening of the cervical lordosis Vertebrae: No fracture or bone marrow edema. Small cortical fracture of the anterior inferior C2 vertebral body is seen on CT but not appreciated on MRI. Cord: Bilateral cord hyperintensity at C4-5 which appears chronic and related to compressive myelopathy. No other cord signal abnormality Posterior Fossa, vertebral arteries, paraspinal tissues: Negative. No prevertebral soft tissue  swelling or edema. Disc levels: C2-3: Negative for stenosis C3-4: Small central disc protrusion with calcification. Mild cord flattening and mild spinal stenosis. Mild foraminal narrowing bilaterally due to spurring. C4-5: Moderately large central disc protrusion with associated spurring. Cord flattening with moderate spinal stenosis. Small cord hyperintensity bilaterally which appears chronic. Moderate foraminal narrowing bilaterally due to spurring C5-6: Mild disc degeneration and spurring. Mild foraminal narrowing bilaterally. Mild central canal stenosis. C6-7: Mild disc degeneration. Moderate left foraminal narrowing due to spurring C7-T1: Negative IMPRESSION: 1. Nondisplaced fracture anterior inferior body of C2 not appreciated by MRI. No other fracture. No epidural hematoma. 2. Multilevel degenerative change most prominent at C4-5. Moderate spinal stenosis at C4-5 with bilateral cord hyperintensity compatible with chronic compressive myelopathy. Electronically Signed   By: Franchot Gallo M.D.   On: 10/01/2022 20:17   CT Head Wo Contrast  Result Date: 10/01/2022 CLINICAL DATA:  Fall with laceration to right cheek EXAM: CT HEAD WITHOUT CONTRAST CT MAXILLOFACIAL WITHOUT CONTRAST CT CERVICAL SPINE WITHOUT CONTRAST TECHNIQUE: Multidetector CT imaging of the head, cervical spine, and maxillofacial structures were performed using the standard protocol without intravenous contrast. Multiplanar CT image reconstructions of the cervical spine and maxillofacial structures were also generated. RADIATION DOSE REDUCTION: This exam was performed according to the departmental dose-optimization program which includes automated exposure control,  adjustment of the mA and/or kV according to patient size and/or use of iterative reconstruction technique. COMPARISON:  CT 08/30/2021 FINDINGS: CT HEAD FINDINGS Brain: No acute territorial infarction, hemorrhage or intracranial mass. Patchy white matter hypodensity likely chronic  small vessel ischemic change. The ventricles are nonenlarged. Vascular: No hyperdense vessels.  Carotid vascular calcification Skull: Normal. Negative for fracture or focal lesion. Other: Opacified right maxillary sinus. Mucosal thickening in the ethmoid sinuses CT MAXILLOFACIAL FINDINGS Osseous: Mastoid air cells are clear. Mandibular heads are normally position. No mandibular fracture. Pterygoid plates and zygomatic arches appear intact. No acute nasal bone fracture Orbits: Negative. No traumatic or inflammatory finding. Sinuses: Completely opacified right maxillary sinus. Moderate mucosal thickening in the ethmoid sinuses. No sinus wall fracture. Soft tissues: Moderate to large right periorbital and infraorbital soft tissue hematoma with small foci of gas consistent with laceration CT CERVICAL SPINE FINDINGS Alignment: Straightening of the cervical spine. No subluxation. Facet alignment within normal limits. Skull base and vertebrae: Vertebral body heights are maintained. Suspected nondisplaced fracture lucency involving the anterior inferior corner of the C2 vertebral body, best seen on sagittal images. Correlate is seen on axial series 9, image 28. No other discrete fractures are visualized. Soft tissues and spinal canal: No significant prevertebral soft tissue enlargement. Disc levels: No abnormal widening or narrowing of C2-C3 disc space. There are multilevel degenerative changes. Moderate severe disc space narrowing and degenerative change C4-C5, C5-C6, C6-C7 and C7-T1. Facet degenerative changes at multiple levels with foraminal narrowing Upper chest: Negative. Other: None IMPRESSION: 1. No CT evidence for acute intracranial abnormality. Patchy white matter hypodensity likely chronic small vessel ischemic change. 2. Findings consistent with nondisplaced teardrop type fracture involving the anterior inferior aspect of the C2 vertebral body. 3. Multilevel moderate severe degenerative changes Critical  Value/emergent results were called by telephone at the time of interpretation on 10/01/2022 at 5:56 pm to provider [facility] , who verbally acknowledged these results. Electronically Signed   By: Donavan Foil M.D.   On: 10/01/2022 17:56   CT Cervical Spine Wo Contrast  Result Date: 10/01/2022 CLINICAL DATA:  Fall with laceration to right cheek EXAM: CT HEAD WITHOUT CONTRAST CT MAXILLOFACIAL WITHOUT CONTRAST CT CERVICAL SPINE WITHOUT CONTRAST TECHNIQUE: Multidetector CT imaging of the head, cervical spine, and maxillofacial structures were performed using the standard protocol without intravenous contrast. Multiplanar CT image reconstructions of the cervical spine and maxillofacial structures were also generated. RADIATION DOSE REDUCTION: This exam was performed according to the departmental dose-optimization program which includes automated exposure control, adjustment of the mA and/or kV according to patient size and/or use of iterative reconstruction technique. COMPARISON:  CT 08/30/2021 FINDINGS: CT HEAD FINDINGS Brain: No acute territorial infarction, hemorrhage or intracranial mass. Patchy white matter hypodensity likely chronic small vessel ischemic change. The ventricles are nonenlarged. Vascular: No hyperdense vessels.  Carotid vascular calcification Skull: Normal. Negative for fracture or focal lesion. Other: Opacified right maxillary sinus. Mucosal thickening in the ethmoid sinuses CT MAXILLOFACIAL FINDINGS Osseous: Mastoid air cells are clear. Mandibular heads are normally position. No mandibular fracture. Pterygoid plates and zygomatic arches appear intact. No acute nasal bone fracture Orbits: Negative. No traumatic or inflammatory finding. Sinuses: Completely opacified right maxillary sinus. Moderate mucosal thickening in the ethmoid sinuses. No sinus wall fracture. Soft tissues: Moderate to large right periorbital and infraorbital soft tissue hematoma with small foci of gas consistent with  laceration CT CERVICAL SPINE FINDINGS Alignment: Straightening of the cervical spine. No subluxation. Facet alignment within normal limits. Skull  base and vertebrae: Vertebral body heights are maintained. Suspected nondisplaced fracture lucency involving the anterior inferior corner of the C2 vertebral body, best seen on sagittal images. Correlate is seen on axial series 9, image 28. No other discrete fractures are visualized. Soft tissues and spinal canal: No significant prevertebral soft tissue enlargement. Disc levels: No abnormal widening or narrowing of C2-C3 disc space. There are multilevel degenerative changes. Moderate severe disc space narrowing and degenerative change C4-C5, C5-C6, C6-C7 and C7-T1. Facet degenerative changes at multiple levels with foraminal narrowing Upper chest: Negative. Other: None IMPRESSION: 1. No CT evidence for acute intracranial abnormality. Patchy white matter hypodensity likely chronic small vessel ischemic change. 2. Findings consistent with nondisplaced teardrop type fracture involving the anterior inferior aspect of the C2 vertebral body. 3. Multilevel moderate severe degenerative changes Critical Value/emergent results were called by telephone at the time of interpretation on 10/01/2022 at 5:56 pm to provider [facility] , who verbally acknowledged these results. Electronically Signed   By: Donavan Foil M.D.   On: 10/01/2022 17:56   CT Maxillofacial WO CM  Result Date: 10/01/2022 CLINICAL DATA:  Fall with laceration to right cheek EXAM: CT HEAD WITHOUT CONTRAST CT MAXILLOFACIAL WITHOUT CONTRAST CT CERVICAL SPINE WITHOUT CONTRAST TECHNIQUE: Multidetector CT imaging of the head, cervical spine, and maxillofacial structures were performed using the standard protocol without intravenous contrast. Multiplanar CT image reconstructions of the cervical spine and maxillofacial structures were also generated. RADIATION DOSE REDUCTION: This exam was performed according to the  departmental dose-optimization program which includes automated exposure control, adjustment of the mA and/or kV according to patient size and/or use of iterative reconstruction technique. COMPARISON:  CT 08/30/2021 FINDINGS: CT HEAD FINDINGS Brain: No acute territorial infarction, hemorrhage or intracranial mass. Patchy white matter hypodensity likely chronic small vessel ischemic change. The ventricles are nonenlarged. Vascular: No hyperdense vessels.  Carotid vascular calcification Skull: Normal. Negative for fracture or focal lesion. Other: Opacified right maxillary sinus. Mucosal thickening in the ethmoid sinuses CT MAXILLOFACIAL FINDINGS Osseous: Mastoid air cells are clear. Mandibular heads are normally position. No mandibular fracture. Pterygoid plates and zygomatic arches appear intact. No acute nasal bone fracture Orbits: Negative. No traumatic or inflammatory finding. Sinuses: Completely opacified right maxillary sinus. Moderate mucosal thickening in the ethmoid sinuses. No sinus wall fracture. Soft tissues: Moderate to large right periorbital and infraorbital soft tissue hematoma with small foci of gas consistent with laceration CT CERVICAL SPINE FINDINGS Alignment: Straightening of the cervical spine. No subluxation. Facet alignment within normal limits. Skull base and vertebrae: Vertebral body heights are maintained. Suspected nondisplaced fracture lucency involving the anterior inferior corner of the C2 vertebral body, best seen on sagittal images. Correlate is seen on axial series 9, image 28. No other discrete fractures are visualized. Soft tissues and spinal canal: No significant prevertebral soft tissue enlargement. Disc levels: No abnormal widening or narrowing of C2-C3 disc space. There are multilevel degenerative changes. Moderate severe disc space narrowing and degenerative change C4-C5, C5-C6, C6-C7 and C7-T1. Facet degenerative changes at multiple levels with foraminal narrowing Upper chest:  Negative. Other: None IMPRESSION: 1. No CT evidence for acute intracranial abnormality. Patchy white matter hypodensity likely chronic small vessel ischemic change. 2. Findings consistent with nondisplaced teardrop type fracture involving the anterior inferior aspect of the C2 vertebral body. 3. Multilevel moderate severe degenerative changes Critical Value/emergent results were called by telephone at the time of interpretation on 10/01/2022 at 5:56 pm to provider [facility] , who verbally acknowledged these results. Electronically  Signed   By: Donavan Foil M.D.   On: 10/01/2022 17:56   DG Chest 1 View  Result Date: 10/01/2022 CLINICAL DATA:  [place] EXAM: CHEST  1 VIEW COMPARISON:  08/30/2021 FINDINGS: Single frontal view of the chest demonstrates a right chest wall port via internal jugular approach, tip overlying the atriocaval junction. Cardiac silhouette is unremarkable. No acute airspace disease, effusion, or pneumothorax. There are no acute bony abnormalities. IMPRESSION: 1. No acute intrathoracic process. Electronically Signed   By: Randa Ngo M.D.   On: 10/01/2022 17:54   DG Knee Complete 4 Views Left  Result Date: 10/01/2022 CLINICAL DATA:  [place], left leg deformity EXAM: LEFT KNEE - COMPLETE 4+ VIEW COMPARISON:  12/03/2016 FINDINGS: Frontal, bilateral oblique, and cross-table lateral views of the left knee are obtained. No acute displaced fracture, subluxation, or dislocation. Moderate 3 compartmental osteoarthritis greatest in the patellofemoral compartment. Synovial osteochondromatosis again noted. Small joint effusion. Prepatellar soft tissue edema. IMPRESSION: 1. No acute displaced fracture. 2. Moderate osteoarthritis with likely reactive joint effusion. 3. Prepatellar soft tissue edema. Electronically Signed   By: Randa Ngo M.D.   On: 10/01/2022 17:53   DG Hip Unilat With Pelvis 2-3 Views Left  Result Date: 10/01/2022 CLINICAL DATA:  [place], left leg deformity EXAM: DG HIP (WITH  OR WITHOUT PELVIS) 2-3V LEFT COMPARISON:  None Available. FINDINGS: Frontal view of the pelvis as well as a frontal and cross-table lateral view of the left hip are obtained. There is internal rotation of the left hip, with no acute fracture, subluxation, or dislocation. Symmetrical bilateral hip osteoarthritis. Soft tissues are unremarkable. Sacroiliac joints are normal. IMPRESSION: 1. No acute displaced fracture. If fracture remains a clinical concern, CT or MRI be performed. 2. Symmetrical bilateral hip osteoarthritis. Electronically Signed   By: Randa Ngo M.D.   On: 10/01/2022 17:52   CT HIP LEFT WO CONTRAST  Result Date: 10/01/2022 CLINICAL DATA:  Blunt facial trauma, fall striking bed rail. Shortening of left leg. EXAM: CT OF THE LEFT HIP WITHOUT CONTRAST TECHNIQUE: Multidetector CT imaging of the left hip was performed according to the standard protocol. Multiplanar CT image reconstructions were also generated. RADIATION DOSE REDUCTION: This exam was performed according to the departmental dose-optimization program which includes automated exposure control, adjustment of the mA and/or kV according to patient size and/or use of iterative reconstruction technique. COMPARISON:  Radiographs 10/01/2022 FINDINGS: Bones/Joint/Cartilage Moderate to prominent loss of articular space in the left hip with marginal spurring along the acetabulum and femoral head. No fracture or acute bony findings identified. Ligaments Suboptimally assessed by CT. Muscles and Tendons Unremarkable Soft tissues Arterial atherosclerosis. IMPRESSION: 1. No fracture or acute bony findings. 2. Moderate to prominent left hip osteoarthritis. 3. Arterial atherosclerosis. Electronically Signed   By: Van Clines M.D.   On: 10/01/2022 17:48    Scheduled Meds:  atorvastatin  40 mg Oral Daily   escitalopram  20 mg Oral Daily   fentaNYL       fluticasone furoate-vilanterol  1 puff Inhalation Daily   And   umeclidinium bromide  1  puff Inhalation Daily   gabapentin  300 mg Oral BID   insulin aspart  0-15 Units Subcutaneous Q4H   insulin glargine-yfgn  8 Units Subcutaneous Q2200   patiromer  8.4 g Oral Daily   Continuous Infusions:  ceFAZolin       LOS: 1 day    Time spent: 50 mins    Shawna Clamp, MD [facility]   If 7PM-7AM,  please contact night-coverage

## 2022-10-02 NOTE — Progress Notes (Signed)
Patient doing well, speech clear, drowsy but a&ox4. Follows commands.

## 2022-10-02 NOTE — Consult Note (Signed)
Referring Physician:  No referring provider defined for this encounter.  Primary Physician:  Steele Sizer, MD  History of Present Illness: 10/02/2022 Janice Short is here today with a chief complaint of mechanical fall yesterday.  She struck her face on a rail, and had immediate onset of change in her strength in her upper extremities.  She also had difficulty moving her left hand and right hand.  She lost strength in her left arm.  She was unable to get herself up from her fall, and was then brought in for evaluation.  During evaluation, she was noted to have C2 fracture as well as severe stenosis at C4-5 with myelomalacia.  She reports significant tingling in her hands worse on the left than the right.  It is slightly better than yesterday, but still a significant issue for her.  Review of Systems:  A 10 point review of systems is negative, except for the pertinent positives and negatives detailed in the HPI.  Past Medical History: Past Medical History:  Diagnosis Date   Allergy    Asthma    Carpal tunnel syndrome on left    Cervical radiculitis    Chronic osteoarthritis    Corns and callosity    Depression    Dermatophytosis of foot    Diabetes mellitus without complication (HCC)    Gout    Hyperlipidemia    Hypertension    Impingement syndrome of left shoulder    Intrahepatic cholangiocarcinoma (HCC)    Lumbosacral neuritis    Microalbuminuria    Obesity    Supraventricular tachycardia 07/01/2015   SVT (supraventricular tachycardia)    Tenosynovitis of wrist    Vitamin D deficiency     Past Surgical History: Past Surgical History:  Procedure Laterality Date   ABDOMINAL HYSTERECTOMY     COLONOSCOPY WITH PROPOFOL N/A 05/06/2021   Procedure: COLONOSCOPY WITH PROPOFOL;  Surgeon: Lucilla Lame, MD;  Location: [facility] ENDOSCOPY;  Service: Endoscopy;  Laterality: N/A;   ESOPHAGOGASTRODUODENOSCOPY (EGD) WITH PROPOFOL N/A 05/06/2021   Procedure:  ESOPHAGOGASTRODUODENOSCOPY (EGD) WITH PROPOFOL;  Surgeon: Lucilla Lame, MD;  Location: [facility] ENDOSCOPY;  Service: Endoscopy;  Laterality: N/A;   PORTA CATH INSERTION N/A 07/17/2021   Procedure: PORTA CATH INSERTION;  Surgeon: Algernon Huxley, MD;  Location: [facility];  Service: Cardiovascular;  Laterality: N/A;    Allergies: Allergies as of 10/01/2022 - Review Complete 10/01/2022  Allergen Reaction Noted   Other Shortness Of Breath 09/01/2016   Cisplatin Itching 11/10/2021   Bydureon [exenatide] Other (See Comments) 06/18/2017    Medications: Current Meds  Medication Sig   albuterol (VENTOLIN HFA) 108 (90 Base) MCG/ACT inhaler INHALE TWO PUFFS BY MOUTH EVERY 6 HOURS AS NEEDED FOR WHEEZING AND FOR SHORTNESS OF BREATH (BULK)   aspirin 81 MG tablet Take 1 tablet by mouth daily.   atorvastatin (LIPITOR) 40 MG tablet Take 1 tablet (40 mg total) by mouth daily.   blood glucose meter kit and supplies Dispense based on patient and insurance preference. Use up to four times daily as directed. (FOR ICD-10 E10.9, E11.9).   diphenhydramine-acetaminophen (TYLENOL PM) 25-500 MG TABS tablet Take 1 tablet by mouth at bedtime as needed.   escitalopram (LEXAPRO) 20 MG tablet TAKE ONE TABLET BY MOUTH DAILY AT 9AM   fluticasone (FLONASE) 50 MCG/ACT nasal spray Place 2 sprays into both nostrils daily as needed. USE 2 SPRAY(S) IN EACH NOSTRIL AT BEDTIME   Fluticasone-Umeclidin-Vilant (TRELEGY ELLIPTA) 100-62.5-25 MCG/ACT AEPB Inhale 1 puff into the  lungs daily. (Patient taking differently: Inhale 1 puff into the lungs daily.)   gabapentin (NEURONTIN) 300 MG capsule TAKE TWO CAPSULES BY MOUTH TWICE DAILY @ 9AM & 5PM   insulin degludec (TRESIBA FLEXTOUCH) 100 UNIT/ML FlexTouch Pen Inject 20 Units into the skin daily.   lidocaine-prilocaine (EMLA) cream Apply 1 Application topically as needed. Apply to port and cover with saran wrap 1-2 hours prior to port access    Social History: Social History    Tobacco Use   Smoking status: Some Days    Types: Cigarettes   Smokeless tobacco: Never   Tobacco comments:    1 per day 01/30/21 does not inhale it  Vaping Use   Vaping Use: Never used  Substance Use Topics   Alcohol use: Not Currently   Drug use: Not Currently    Types: Benzodiazepines    Family Medical History: Family History  Problem Relation Age of Onset   Heart attack Mother    CAD Mother    Kidney disease Father     Physical Examination: Vitals:   10/02/22 0231 10/02/22 0419  BP: (!) 140/82 (!) 115/59  Pulse: 85 94  Resp: 18 20  Temp: 99.5 F (37.5 C) 99.5 F (37.5 C)  SpO2: 100% 98%    General: Patient is well developed, well nourished, calm, collected, and in no apparent distress. Attention to examination is appropriate.  Neck:   In collar  Respiratory: Patient is breathing without any difficulty.  Heart sounds normal no MRG. Chest Clear to Auscultation Bilaterally.  NEUROLOGICAL:     Awake, alert, oriented to person, place, and time.  Speech is clear and fluent. Fund of knowledge is appropriate.  Laceration on R cheek sewn together.  Cranial Nerves: Pupils equal round and reactive to light.  Facial tone is symmetric.  Facial sensation is symmetric. Shoulder shrug is symmetric. Tongue protrusion is midline.  She cannot perform drift.   Strength: Side Biceps Triceps Deltoid Interossei Grip Wrist Ext. Wrist Flex.  R 4 4 4- 4- 4- 4- 4-  L 4- _0 Side Iliopsoas Quads Hamstring PF DF EHL  R _1 L _2 Reflexes are 2+ and symmetric at the biceps, triceps, brachioradialis, patella and achilles.   Hoffman's is present.   Bilateral upper and lower extremity sensation is intact to light touch except forearm and hands, which are tingling and have diminished sensation.    Gait is untested   Medical Decision Making  Imaging: MRI C spine 10/01/22 IMPRESSION: 1. Nondisplaced fracture anterior inferior body of C2  not appreciated by MRI. No other fracture. No epidural hematoma. 2. Multilevel degenerative change most prominent at C4-5. Moderate spinal stenosis at C4-5 with bilateral cord hyperintensity compatible with chronic compressive myelopathy.     Electronically Signed   By: Franchot Gallo M.D.   On: 10/01/2022 20:17  CT C spine 10/01/22 IMPRESSION: 1. No CT evidence for acute intracranial abnormality. Patchy white matter hypodensity likely chronic small vessel ischemic change. 2. Findings consistent with nondisplaced teardrop type fracture involving the anterior inferior aspect of the C2 vertebral body. 3. Multilevel moderate severe degenerative changes   Critical Value/emergent results were called by telephone at the time of interpretation on 10/01/2022 at 5:56 pm to provider [facility] , who verbally acknowledged these results.     Electronically Signed   By: Donavan Foil M.D.   On: 10/01/2022 17:56  I have personally reviewed the images and agree with the above interpretation.  Assessment and Plan: Janice Short is a pleasant 61 y.o. female with C2 fracture that appears to be stable.  She has an incomplete spinal cord injury consistent with a central cord injury.  She has significant weakness in her upper extremities that was not present prior to her fall.  She has gotten slightly better, but is still profoundly weak.  She has severe stenosis at C4-5 with myelomalacia at this level.  I think this is likely the level of her injury.  She has implied cervical spinal instability given the cervical spinal cord contusion she suffered from her fall.  Given her ongoing symptoms, neurologic decline, and incomplete spinal cord injury consistent with central cord syndrome, surgical intervention is indicated.  No conservative management is indicated.  I have recommended C4-5 anterior cervical discectomy and fusion.  I discussed the planned procedure at length with the patient, including the risks,  benefits, alternatives, and indications. The risks discussed include but are not limited to bleeding, infection, need for reoperation, spinal fluid leak, stroke, vision loss, anesthetic complication, coma, paralysis, and even death. We also discussed the possibility of post-operative dysphagia, vocal cord paralysis, and the risk of adjacent segment disease in the future. I also described in detail that improvement was not guaranteed.  The patient expressed understanding of these risks, and asked that we proceed with surgery. I described the surgery in layman's terms, and gave ample opportunity for questions, which were answered to the best of my ability.    The surgery is being performed on an urgent basis.  She has multiple other medical issues which may complicate her recovery, but I expect her to recover well.    Janice Short K. Izora Ribas MD, [facility] Neurosurgery

## 2022-10-02 NOTE — Anesthesia Postprocedure Evaluation (Signed)
Anesthesia Post Note  Patient: Janice Short  Procedure(s) Performed: ANTERIOR CERVICAL DECOMPRESSION/DISCECTOMY FUSION 1 LEVEL  Patient location during evaluation: PACU Anesthesia Type: General Level of consciousness: awake and alert Pain management: pain level controlled Vital Signs Assessment: post-procedure vital signs reviewed and stable Respiratory status: spontaneous breathing, nonlabored ventilation and respiratory function stable Cardiovascular status: blood pressure returned to baseline and stable Postop Assessment: no apparent nausea or vomiting Anesthetic complications: no   No notable events documented.   Last Vitals:  Vitals:   10/02/22 1100 10/02/22 1125  BP: 128/67 135/68  Pulse: 83 81  Resp: 16 18  Temp:  36.6 C  SpO2: 96% 97%    Last Pain:  Vitals:   10/02/22 1125  TempSrc: Oral  PainSc:                  [place] Ouch

## 2022-10-02 NOTE — Op Note (Addendum)
Indications: Janice Short is a 61 yo female who suffered a central cord injury causing central cord syndrome after a fall yesterday.  This was due to underlying cervical stenosis and implied cervical instability.  Due to her incomplete spinal cord injury and spinal cord contusion, surgical intervention was recommended.  Findings: osteophyte compressing the thecal sac  Preoperative Diagnosis: Cervical central cord syndrome, cervical stenosis, instability, cervical contusion Postoperative Diagnosis: same   EBL: 25 ml IVF: 500 ml Drains: none Disposition: Extubated and Stable to PACU Complications: none  A foley catheter was placed.   Preoperative Note:   Risks of surgery discussed include: infection, bleeding, stroke, coma, death, paralysis, CSF leak, nerve/spinal cord injury, numbness, tingling, weakness, complex regional pain syndrome, recurrent stenosis and/or disc herniation, vascular injury, development of instability, neck/back pain, need for further surgery, persistent symptoms, development of deformity, and the risks of anesthesia. The patient understood these risks and agreed to proceed.  Procedure:  1) Anterior cervical diskectomy and fusion at C4-5 2) Anterior cervical instrumentation at C4-5 using Globux Xtend 3) Structural allograft consisting of corticocancellous allograft   Procedure: After obtaining informed consent, the patient taken to the operating room, placed in supine position, general anesthesia induced.  The patient had a small shoulder roll placed behind their shoulders.  The patient received preop antibiotics and IV Decadron.  The patient had a neck incision outlined, was prepped and draped in usual sterile fashion. The incision was injected with local anesthetic.   An incision was opened, dissection taken down medial to the carotid artery and jugular vein, lateral to the trachea and esophagus.  The prevertebral fascia identified and a localizing x-ray demonstrated the  correct level.  The longus colli were dissected laterally, and self-retaining retractors placed to open the operative field. The microscope was then brought into the field.  With this complete, distractor pins were placed in the vertebral bodies of C4 and C5. The distractor was placed, and the annulus at C4/5 was opened using a bovie.  Curettes and pituitary rongeurs used to remove the majority of disk, then the drill was used to remove the posterior osteophyte and begin the foraminotomies. The nerve hook was used to elevate the posterior longitudinal ligament, which was then removed with Kerrison rongeurs. The microblunt nerve hook could be passed out the foramen bilaterally.   Meticulous hemostasis obtained.  Structural allograft was tapped behind the anterior lip of the vertebral body at C4/5 (7 mm).    The caspar distractor was removed, and bone wax used for hemostasis. A separate, 10 mm Globus Xtend plate was chosen.  Two screws placed in each vertebral body, respectively making sure the screws were behind the locking mechanism.  Final AP and lateral radiographs were taken.   Please note that the plate is not inclusive to the interbody structural allograft.  The anchoring mechanism of the plate is completely separate from the allograft.  With everything in good position, the wound was irrigated copiously and meticulous hemostasis obtained.  Wound was closed in 2 layers using interrupted inverted 3-0 Vicryl sutures.  The wound was dressed with dermabond, the head of bed at 30 degrees, taken to recovery room in stable condition.  No new postop neurological deficits were identified.  Sponge and pattie counts were correct at the end of the procedure.   Monitoring was utilized and improved after decompression.   I performed the entire procedure.   Meade Maw MD

## 2022-10-02 NOTE — Progress Notes (Signed)
   10/02/22 2000  Clinical Encounter Type  Visited With Patient  Visit Type Initial  Referral From Nurse  Consult/Referral To Chaplain   Chaplain responded to nurse consult. Chaplain provided a compassionate presence and prayer. Patient resting. Chaplain services are available for follow up as needed.

## 2022-10-02 NOTE — Anesthesia Preprocedure Evaluation (Addendum)
Anesthesia Evaluation  Patient identified by MRN, date of birth, ID band Patient awake    Reviewed: Allergy & Precautions, NPO status , Patient's Chart, lab work & pertinent test results  History of Anesthesia Complications Negative for: history of anesthetic complications  Airway Mallampati: III  TM Distance: >3 FB Neck ROM: full    Dental  (+) Chipped, Poor Dentition, Missing   Pulmonary asthma , COPD, Current Smoker and Patient abstained from smoking.,     + wheezing      Cardiovascular Exercise Tolerance: Good hypertension, (-) angina(-) Past MI and (-) DOE Normal cardiovascular exam     Neuro/Psych PSYCHIATRIC DISORDERS Depression  central cord syndrome, cervical stenosis  Closed fracture of C2 vertebra  Neuromuscular disease    GI/Hepatic negative GI ROS, Neg liver ROS,   Endo/Other  diabetes, Type 2, Insulin Dependent  Renal/GU Renal InsufficiencyRenal diseaseStage 3a chronic kidney disease  negative genitourinary   Musculoskeletal  (+) Arthritis ,   Abdominal   Peds  Hematology  (+) Blood dyscrasia, anemia , thrombocytopenia   Anesthesia Other Findings intrahepatic cholangiocarcinoma on chemotherapy, last infusion 09/18/2022  left hip pain and bilateral upper extremity numbness since the fall  Past Medical History: No date: Allergy No date: Asthma No date: Carpal tunnel syndrome on left No date: Cervical radiculitis No date: Chronic osteoarthritis No date: Corns and callosity No date: Depression No date: Dermatophytosis of foot No date: Diabetes mellitus without complication (HCC) No date: Gout No date: Hyperlipidemia No date: Hypertension No date: Impingement syndrome of left shoulder No date: Intrahepatic cholangiocarcinoma (HCC) No date: Lumbosacral neuritis No date: Microalbuminuria No date: Obesity 07/01/2015: Supraventricular tachycardia (HCC) No date: SVT (supraventricular tachycardia)  (HCC) No date: Tenosynovitis of wrist No date: Vitamin D deficiency  Past Surgical History: No date: ABDOMINAL HYSTERECTOMY  BMI    Body Mass Index: 24.28 kg/m      Reproductive/Obstetrics negative OB ROS                            Anesthesia Physical  Anesthesia Plan  ASA: 2  Anesthesia Plan: General   Post-op Pain Management: Toradol IV (intra-op)* and Ofirmev IV (intra-op)*   Induction: Intravenous  PONV Risk Score and Plan: 3 and Ondansetron, Dexamethasone, Midazolam, Propofol infusion and TIVA  Airway Management Planned: Oral ETT  Additional Equipment:   Intra-op Plan:   Post-operative Plan: Extubation in OR  Informed Consent: I have reviewed the patients History and Physical, chart, labs and discussed the procedure including the risks, benefits and alternatives for the proposed anesthesia with the patient or authorized representative who has indicated his/her understanding and acceptance.     Dental Advisory Given  Plan Discussed with: Anesthesiologist, CRNA and Surgeon  Anesthesia Plan Comments: (Patient consented for risks of anesthesia including but not limited to:  - adverse reactions to medications - risk of airway placement if required - damage to eyes, teeth, lips or other oral mucosa - nerve damage due to positioning  - sore throat or hoarseness - Damage to heart, brain, nerves, lungs, other parts of body or loss of life  Patient voiced understanding.)       Anesthesia Quick Evaluation

## 2022-10-02 NOTE — Interval H&P Note (Signed)
History and Physical Interval Note:  10/02/2022 7:15 AM  Janice Short  has presented today for surgery, with the diagnosis of central cord syndrome, cervical stenosis.  The various methods of treatment have been discussed with the patient and family. After consideration of risks, benefits and other options for treatment, the patient has consented to  Procedure(s) with comments: ANTERIOR CERVICAL DECOMPRESSION/DISCECTOMY FUSION 1 LEVEL (N/A) - ACDF C4-5. as a surgical intervention.  The patient's history has been reviewed, patient examined, no change in status, stable for surgery.  I have reviewed the patient's chart and labs.  Questions were answered to the patient's satisfaction.    The patient has a spinal cord injury that does not allow her to adequately utilize her hands.  She is unable to sign her consent, but gave her consent verbally to the nurse in preop and to me.  She has asked that we proceed with surgery without signing the consent form herself, but with nursing staff making a mark for her with her consent.   [name]

## 2022-10-02 NOTE — H&P (View-Only) (Signed)
  Referring Physician:  No referring provider defined for this encounter.  Primary Physician:  Sowles, Krichna, MD  History of Present Illness: 10/02/2022 [name] is here today with a chief complaint of mechanical fall yesterday.  She struck her face on a rail, and had immediate onset of change in her strength in her upper extremities.  She also had difficulty moving her left hand and right hand.  She lost strength in her left arm.  She was unable to get herself up from her fall, and was then brought in for evaluation.  During evaluation, she was noted to have C2 fracture as well as severe stenosis at C4-5 with myelomalacia.  She reports significant tingling in her hands worse on the left than the right.  It is slightly better than yesterday, but still a significant issue for her.  Review of Systems:  A 10 point review of systems is negative, except for the pertinent positives and negatives detailed in the HPI.  Past Medical History: Past Medical History:  Diagnosis Date   Allergy    Asthma    Carpal tunnel syndrome on left    Cervical radiculitis    Chronic osteoarthritis    Corns and callosity    Depression    Dermatophytosis of foot    Diabetes mellitus without complication (HCC)    Gout    Hyperlipidemia    Hypertension    Impingement syndrome of left shoulder    Intrahepatic cholangiocarcinoma (HCC)    Lumbosacral neuritis    Microalbuminuria    Obesity    Supraventricular tachycardia 07/01/2015   SVT (supraventricular tachycardia)    Tenosynovitis of wrist    Vitamin D deficiency     Past Surgical History: Past Surgical History:  Procedure Laterality Date   ABDOMINAL HYSTERECTOMY     COLONOSCOPY WITH PROPOFOL N/A 05/06/2021   Procedure: COLONOSCOPY WITH PROPOFOL;  Surgeon: Wohl, Darren, MD;  Location: [facility];  Service: Endoscopy;  Laterality: N/A;   ESOPHAGOGASTRODUODENOSCOPY (EGD) WITH PROPOFOL N/A 05/06/2021   Procedure:  ESOPHAGOGASTRODUODENOSCOPY (EGD) WITH PROPOFOL;  Surgeon: Wohl, Darren, MD;  Location: [facility];  Service: Endoscopy;  Laterality: N/A;   PORTA CATH INSERTION N/A 07/17/2021   Procedure: PORTA CATH INSERTION;  Surgeon: Dew, Jason S, MD;  Location: [facility];  Service: Cardiovascular;  Laterality: N/A;    Allergies: Allergies as of 10/01/2022 - Review Complete 10/01/2022  Allergen Reaction Noted   Other Shortness Of Breath 09/01/2016   Cisplatin Itching 11/10/2021   Bydureon [exenatide] Other (See Comments) 06/18/2017    Medications: Current Meds  Medication Sig   albuterol (VENTOLIN HFA) 108 (90 Base) MCG/ACT inhaler INHALE TWO PUFFS BY MOUTH EVERY 6 HOURS AS NEEDED FOR WHEEZING AND FOR SHORTNESS OF BREATH (BULK)   aspirin 81 MG tablet Take 1 tablet by mouth daily.   atorvastatin (LIPITOR) 40 MG tablet Take 1 tablet (40 mg total) by mouth daily.   blood glucose meter kit and supplies Dispense based on patient and insurance preference. Use up to four times daily as directed. (FOR ICD-10 E10.9, E11.9).   diphenhydramine-acetaminophen (TYLENOL PM) 25-500 MG TABS tablet Take 1 tablet by mouth at bedtime as needed.   escitalopram (LEXAPRO) 20 MG tablet TAKE ONE TABLET BY MOUTH DAILY AT 9AM   fluticasone (FLONASE) 50 MCG/ACT nasal spray Place 2 sprays into both nostrils daily as needed. USE 2 SPRAY(S) IN EACH NOSTRIL AT BEDTIME   Fluticasone-Umeclidin-Vilant (TRELEGY ELLIPTA) 100-62.5-25 MCG/ACT AEPB Inhale 1 puff into the   lungs daily. (Patient taking differently: Inhale 1 puff into the lungs daily.)   gabapentin (NEURONTIN) 300 MG capsule TAKE TWO CAPSULES BY MOUTH TWICE DAILY @ 9AM & 5PM   insulin degludec (TRESIBA FLEXTOUCH) 100 UNIT/ML FlexTouch Pen Inject 20 Units into the skin daily.   lidocaine-prilocaine (EMLA) cream Apply 1 Application topically as needed. Apply to port and cover with saran wrap 1-2 hours prior to port access    Social History: Social History    Tobacco Use   Smoking status: Some Days    Types: Cigarettes   Smokeless tobacco: Never   Tobacco comments:    1 per day 01/30/21 does not inhale it  Vaping Use   Vaping Use: Never used  Substance Use Topics   Alcohol use: Not Currently   Drug use: Not Currently    Types: Benzodiazepines    Family Medical History: Family History  Problem Relation Age of Onset   Heart attack Mother    CAD Mother    Kidney disease Father     Physical Examination: Vitals:   10/02/22 0231 10/02/22 0419  BP: (!) 140/82 (!) 115/59  Pulse: 85 94  Resp: 18 20  Temp: 99.5 F (37.5 C) 99.5 F (37.5 C)  SpO2: 100% 98%    General: Patient is well developed, well nourished, calm, collected, and in no apparent distress. Attention to examination is appropriate.  Neck:   In collar  Respiratory: Patient is breathing without any difficulty.  Heart sounds normal no MRG. Chest Clear to Auscultation Bilaterally.  NEUROLOGICAL:     Awake, alert, oriented to person, place, and time.  Speech is clear and fluent. Fund of knowledge is appropriate.  Laceration on R cheek sewn together.  Cranial Nerves: Pupils equal round and reactive to light.  Facial tone is symmetric.  Facial sensation is symmetric. Shoulder shrug is symmetric. Tongue protrusion is midline.  She cannot perform drift.   Strength: Side Biceps Triceps Deltoid Interossei Grip Wrist Ext. Wrist Flex.  R 4 4 4- 4- 4- 4- 4-  L 4- 2 2 2 2 2 2   Side Iliopsoas Quads Hamstring PF DF EHL  R 5 5 5 5 5 5  L 5 5 5 5 5 5   Reflexes are 2+ and symmetric at the biceps, triceps, brachioradialis, patella and achilles.   Hoffman's is present.   Bilateral upper and lower extremity sensation is intact to light touch except forearm and hands, which are tingling and have diminished sensation.    Gait is untested   Medical Decision Making  Imaging: MRI C spine 10/01/22 IMPRESSION: 1. Nondisplaced fracture anterior inferior body of C2  not appreciated by MRI. No other fracture. No epidural hematoma. 2. Multilevel degenerative change most prominent at C4-5. Moderate spinal stenosis at C4-5 with bilateral cord hyperintensity compatible with chronic compressive myelopathy.     Electronically Signed   By: Charles  Clark M.D.   On: 10/01/2022 20:17  CT C spine 10/01/22 IMPRESSION: 1. No CT evidence for acute intracranial abnormality. Patchy white matter hypodensity likely chronic small vessel ischemic change. 2. Findings consistent with nondisplaced teardrop type fracture involving the anterior inferior aspect of the C2 vertebral body. 3. Multilevel moderate severe degenerative changes   Critical Value/emergent results were called by telephone at the time of interpretation on 10/01/2022 at 5:56 pm to provider JENNA POGGI , who verbally acknowledged these results.     Electronically Signed   By: Kim  Fujinaga M.D.   On: 10/01/2022 17:56    I have personally reviewed the images and agree with the above interpretation.  Assessment and Plan: Ms. Balboa is a pleasant 61 y.o. female with C2 fracture that appears to be stable.  She has an incomplete spinal cord injury consistent with a central cord injury.  She has significant weakness in her upper extremities that was not present prior to her fall.  She has gotten slightly better, but is still profoundly weak.  She has severe stenosis at C4-5 with myelomalacia at this level.  I think this is likely the level of her injury.  She has implied cervical spinal instability given the cervical spinal cord contusion she suffered from her fall.  Given her ongoing symptoms, neurologic decline, and incomplete spinal cord injury consistent with central cord syndrome, surgical intervention is indicated.  No conservative management is indicated.  I have recommended C4-5 anterior cervical discectomy and fusion.  I discussed the planned procedure at length with the patient, including the risks,  benefits, alternatives, and indications. The risks discussed include but are not limited to bleeding, infection, need for reoperation, spinal fluid leak, stroke, vision loss, anesthetic complication, coma, paralysis, and even death. We also discussed the possibility of post-operative dysphagia, vocal cord paralysis, and the risk of adjacent segment disease in the future. I also described in detail that improvement was not guaranteed.  The patient expressed understanding of these risks, and asked that we proceed with surgery. I described the surgery in layman's terms, and gave ample opportunity for questions, which were answered to the best of my ability.    The surgery is being performed on an urgent basis.  She has multiple other medical issues which may complicate her recovery, but I expect her to recover well.    Jisele Price K. Saahas Hidrogo MD, MPHS Neurosurgery  

## 2022-10-02 NOTE — Anesthesia Procedure Notes (Addendum)
Procedure Name: Intubation Date/Time: 10/02/2022 8:13 AM  Performed by: Rolla Plate, CRNAPre-anesthesia Checklist: Patient identified, Patient being monitored, Timeout performed, Emergency Drugs available and Suction available Patient Re-evaluated:Patient Re-evaluated prior to induction Oxygen Delivery Method: Circle system utilized Preoxygenation: Pre-oxygenation with 100% oxygen Induction Type: IV induction Ventilation: Mask ventilation without difficulty Laryngoscope Size: 3 and McGraph Grade View: Grade II Tube type: Oral Tube size: 7.0 mm Number of attempts: 1 Airway Equipment and Method: Stylet and Video-laryngoscopy Placement Confirmation: ETT inserted through vocal cords under direct vision, positive ETCO2 and breath sounds checked- equal and bilateral Secured at: 21 cm Tube secured with: Tape Dental Injury: Teeth and Oropharynx as per pre-operative assessment  Comments: Gentle IV induction, video laryngoscopy with head/neck neutral position. Dentition unchanged

## 2022-10-02 NOTE — Transfer of Care (Signed)
Immediate Anesthesia Transfer of Care Note  Patient: Janice Short  Procedure(s) Performed: ANTERIOR CERVICAL DECOMPRESSION/DISCECTOMY FUSION 1 LEVEL  Patient Location: PACU  Anesthesia Type:General  Level of Consciousness: drowsy  Airway & Oxygen Therapy: Patient Spontanous Breathing and Patient connected to face mask oxygen  Post-op Assessment: Report given to RN and Post -op Vital signs reviewed and stable  Post vital signs: Reviewed  Last Vitals:  Vitals Value Taken Time  BP 117/61 10/02/22 0945  Temp    Pulse 96 10/02/22 0945  Resp 12 10/02/22 0945  SpO2 98 % 10/02/22 0945  Vitals shown include unvalidated device data.  Last Pain:      Patients Stated Pain Goal: 0 (01/75/10 2585)  Complications: No notable events documented.

## 2022-10-03 DIAGNOSIS — S12101A Unspecified nondisplaced fracture of second cervical vertebra, initial encounter for closed fracture: Secondary | ICD-10-CM | POA: Diagnosis not present

## 2022-10-03 LAB — GLUCOSE, CAPILLARY
Glucose-Capillary: 136 mg/dL — ABNORMAL HIGH (ref 70–99)
Glucose-Capillary: 117 mg/dL — ABNORMAL HIGH (ref 70–99)
Glucose-Capillary: 100 mg/dL — ABNORMAL HIGH (ref 70–99)
Glucose-Capillary: 117 mg/dL — ABNORMAL HIGH (ref 70–99)
Glucose-Capillary: 162 mg/dL — ABNORMAL HIGH (ref 70–99)
Glucose-Capillary: 175 mg/dL — ABNORMAL HIGH (ref 70–99)

## 2022-10-03 LAB — CBC
HCT: 29.9 % — ABNORMAL LOW (ref 36.0–46.0)
Hemoglobin: 9.5 g/dL — ABNORMAL LOW (ref 12.0–15.0)
MCH: 31.1 pg (ref 26.0–34.0)
MCHC: 31.8 g/dL (ref 30.0–36.0)
MCV: 98 fL (ref 80.0–100.0)
Platelets: 152 10*3/uL (ref 150–400)
RBC: 3.05 MIL/uL — ABNORMAL LOW (ref 3.87–5.11)
RDW: 20.1 % — ABNORMAL HIGH (ref 11.5–15.5)
WBC: 21.6 10*3/uL — ABNORMAL HIGH (ref 4.0–10.5)
nRBC: 0 % (ref 0.0–0.2)

## 2022-10-03 LAB — BASIC METABOLIC PANEL
Anion gap: 3 — ABNORMAL LOW (ref 5–15)
BUN: 18 mg/dL (ref 8–23)
CO2: 22 mmol/L (ref 22–32)
Calcium: 8.5 mg/dL — ABNORMAL LOW (ref 8.9–10.3)
Chloride: 113 mmol/L — ABNORMAL HIGH (ref 98–111)
Creatinine, Ser: 1.11 mg/dL — ABNORMAL HIGH (ref 0.44–1.00)
GFR, Estimated: 56 mL/min — ABNORMAL LOW (ref >60)
Glucose, Bld: 131 mg/dL — ABNORMAL HIGH (ref 70–99)
Potassium: 4.5 mmol/L (ref 3.5–5.1)
Sodium: 138 mmol/L (ref 135–145)

## 2022-10-03 LAB — PHOSPHORUS: Phosphorus: 2.6 mg/dL (ref 2.5–4.6)

## 2022-10-03 LAB — MAGNESIUM: Magnesium: 1.9 mg/dL (ref 1.7–2.4)

## 2022-10-03 MED ORDER — AMLODIPINE BESYLATE 5 MG PO TABS
2.5000 mg | ORAL_TABLET | Freq: Every day | ORAL | Status: DC
  Administered 2022-10-03 – 2022-10-08 (×6): 2.5 mg via ORAL
  Filled 2022-10-03 (×6): qty 1

## 2022-10-03 MED ORDER — INSULIN ASPART 100 UNIT/ML IJ SOLN
0.0000 [IU] | Freq: Three times a day (TID) | INTRAMUSCULAR | Status: DC
  Administered 2022-10-03: 3 [IU] via SUBCUTANEOUS
  Administered 2022-10-04: 2 [IU] via SUBCUTANEOUS
  Administered 2022-10-04: 3 [IU] via SUBCUTANEOUS
  Administered 2022-10-04: 5 [IU] via SUBCUTANEOUS
  Administered 2022-10-05: 11 [IU] via SUBCUTANEOUS
  Administered 2022-10-05: 3 [IU] via SUBCUTANEOUS
  Administered 2022-10-05: 2 [IU] via SUBCUTANEOUS
  Administered 2022-10-05 – 2022-10-06 (×2): 5 [IU] via SUBCUTANEOUS
  Administered 2022-10-06 (×3): 3 [IU] via SUBCUTANEOUS
  Administered 2022-10-07: 2 [IU] via SUBCUTANEOUS
  Administered 2022-10-07: 3 [IU] via SUBCUTANEOUS
  Administered 2022-10-07 (×2): 2 [IU] via SUBCUTANEOUS
  Administered 2022-10-08: 3 [IU] via SUBCUTANEOUS
  Administered 2022-10-08: 2 [IU] via SUBCUTANEOUS
  Filled 2022-10-03 (×18): qty 1

## 2022-10-03 MED ORDER — AMLODIPINE BESYLATE 5 MG PO TABS: 5.0000 mg | ORAL_TABLET | Freq: Every day | ORAL | Status: DC

## 2022-10-03 NOTE — Progress Notes (Signed)
PROGRESS NOTE    Janice Short  RXY:[id number] DOB: [birth year] DOA: 10/01/2022 PCP: Steele Sizer, MD   Brief Narrative:  This 61 years old female with PMH significant for IDDM, chronic bronchitis, intrahepatic cholangiocarcinoma on chemotherapy, last infusion on 09/18/2022 presented in the ED s/p accidental fall.  She was unable to stand up.  She sustained a laceration on the left cheek and complained of left hip pain and bilateral upper extremity numbness since the fall.  Imaging in the ED shows nondisplaced fracture of anterior inferior body of C2.  No epidural hematoma.  Multilevel degenerative changes most prominent at C4 and C5. Patient was placed on cervical collar. ED physician has spoken with neurosurgeon Dr. Cari Caraway who recommended ACDF for acute spinal contusion at C4-C5 due to severe spinal stenosis.  Patient is admitted for further management.  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 She suffered central cord injury causing central cord syndrome status post fall. Patient was evaluated by Dr. Cari Caraway who recommended ACDF. Continue cervical collar. Patient is s/p ACDF POD # 2 Continue adequate pain control. She has slight improvement in pain level/ strength Reassessed by neurosurgery recommended pain control and  PT and OT eval.   Left hip pain: S/p accidental fall. X-ray hip shows no acute fracture. Continue adequate pain control.  Cheek lacerations without complication: Lacerations sutured in ED. Continue wound care.  CKD stage IIIa: Serum creatinine at baseline. Avoid nephrotoxic medications.   Hyperkalemia: > Resolved. Potassium 5.7. Lokelma x1 given. Potassium normalized 4.5  Chronic bronchitis: Continue home inhalers. DuoNebs as needed. Continue Trelegy Ellipta.   Type 2 diabetes with hyperglycemia: Sliding scale insulin coverage Continue Semglee 8 units at bedtime   Essential HTN: Start amlodipine 2.5 mg daily.   DVT prophylaxis: SCDs Code Status: Full code Family Communication: No family at bedside. Disposition Plan:  Status is: Inpatient Remains inpatient appropriate because:    Admitted s/p accidental fall with central cord injury causing central cord syndrome.  Neurosurgery consulted patient underwent s/p ACDF POD 2, PT and OT evaluation pending.  Consultants:  Neurosurgery  Procedures: S/p ACDF Antimicrobials: None  Subjective: Patient was seen and examined at bedside.  Overnight events noted.   She is S/p ACDF POD # 2, lying comfortably in the bed. States she has pain in the neck. She reports improvement in pain and weakness.  Objective: Vitals:   10/02/22 2054 10/03/22 0000 10/03/22 0324 10/03/22 0728  BP: (!) 153/84 (!) 153/74 (!) 141/68 (!) 154/71  Pulse: 90 86 89 96  Resp: 20 20 20 20   Temp: 98.5 F (36.9 C) 98.8 F (37.1 C) 98.2 F (36.8 C) 98.2 F (36.8 C)  TempSrc: Oral Oral Oral   SpO2: 100% 97% 99% 95%  Weight:      Height:        Intake/Output Summary (Last  24 hours) at 10/03/2022 1016 Last data filed at 10/03/2022 0600 Gross per 24 hour  Intake 360 ml  Output 975 ml  Net -615 ml   Filed Weights   10/01/22 1629 10/02/22 0716  Weight: 80 kg 80 kg    Examination:  General exam: Appears comfortable, not in any acute distress,  deconditioned. Lacerations noted on the neck and the left cheek Respiratory system: CTA bilaterally, respiratory effort normal, RR 16 Cardiovascular system: S1 & S2 heard, regular rate and rhythm, no murmur. Gastrointestinal system: Abdomen is soft, non tender, non distended, BS+ Central nervous system: Alert and oriented x 3. No focal neurological deficits. Back: Tenderness noted in the upper back, neck Extremities: No edema, no cyanosis, no clubbing. Skin: No rashes, lesions or ulcers Psychiatry: Judgement and insight appear normal. Mood & affect appropriate.     Data Reviewed: I have personally reviewed following labs and imaging studies  CBC: Recent Labs  Lab 10/01/22 1631 10/03/22 0359  WBC 12.2* 21.6*  NEUTROABS 9.0*  --   HGB 9.7* 9.5*  HCT 31.0* 29.9*  MCV 99.0 98.0  PLT 97* 960   Basic Metabolic Panel: Recent Labs  Lab 10/01/22 1755 10/02/22 0553 10/03/22 0359  NA 136 140 138  K 5.7* 4.5 4.5  CL 110 114* 113*  CO2 21* 21* 22  GLUCOSE 243* 131* 131*  BUN 13 13 18   CREATININE 1.30* 1.25* 1.11*  CALCIUM 7.9* 8.0* 8.5*  MG  --   --  1.9  PHOS  --   --  2.6   GFR: Estimated Creatinine Clearance: 56.5 mL/min (A) (by C-G formula based on SCr of 1.11 mg/dL (H)). Liver Function Tests: No results for input(s): "AST", "ALT", "ALKPHOS", "BILITOT", "PROT", "ALBUMIN" in the last 168 hours. No results for input(s): "LIPASE", "AMYLASE" in the last 168 hours. No results for input(s): "AMMONIA" in the last 168 hours. Coagulation Profile: Recent Labs  Lab 10/01/22 2106  INR 1.2   Cardiac Enzymes: No results for input(s): "CKTOTAL", "CKMB", "CKMBINDEX", "TROPONINI" in the last 168 hours. BNP (last 3 results) No results for input(s): "PROBNP" in the last 8760 hours. HbA1C: No results for input(s): "HGBA1C" in the last 72 hours. CBG: Recent Labs  Lab 10/02/22 2045 10/02/22 2355 10/03/22 0320 10/03/22 0444 10/03/22 0733  GLUCAP 285* 215* 136* 117* 100*    Lipid Profile: No results for input(s): "CHOL", "HDL", "LDLCALC", "TRIG", "CHOLHDL", "LDLDIRECT" in the last 72 hours. Thyroid Function Tests: No results for input(s): "TSH", "T4TOTAL", "FREET4", "T3FREE", "THYROIDAB" in the last 72 hours. Anemia Panel: No results for input(s): "VITAMINB12", "FOLATE", "FERRITIN", "TIBC", "IRON", "RETICCTPCT" in the last 72 hours. Sepsis Labs: No results for input(s): "PROCALCITON", "LATICACIDVEN" in the last 168 hours.  Recent Results (from the past 240 hour(s))  SARS Coronavirus 2 by RT PCR (hospital order, performed in [facility] hospital lab) *cepheid single result test* Anterior Nasal Swab     Status: None   Collection Time: 10/01/22  4:31 PM   Specimen: Anterior Nasal Swab  Result Value Ref Range Status   SARS Coronavirus 2 by RT PCR NEGATIVE NEGATIVE Final    Comment: (NOTE) SARS-CoV-2 target nucleic acids are NOT DETECTED.  The SARS-CoV-2 RNA is generally detectable in upper and lower respiratory specimens during the acute phase of infection. The lowest concentration of SARS-CoV-2 viral copies this assay can detect is 250 copies / mL. A negative result does not preclude SARS-CoV-2 infection and should not be used as the sole basis for treatment or  other patient management decisions.  A negative result may occur with improper specimen collection / handling, submission of specimen other than nasopharyngeal swab, presence of viral mutation(s) within the areas targeted by this assay, and inadequate number of viral copies (<250 copies / mL). A negative result must be combined with clinical observations, patient history, and epidemiological information.  Fact Sheet for Patients:   https://www.patel.info/  Fact Sheet for Healthcare Providers: https://hall.com/  This test is not yet approved or  cleared by the [place] FDA and has been authorized for detection and/or diagnosis of SARS-CoV-2  by FDA under an Emergency Use Authorization (EUA).  This EUA will remain in effect (meaning this test can be used) for the duration of the COVID-19 declaration under Section 564(b)(1) of the Act, 21 U.S.C. section 360bbb-3(b)(1), unless the authorization is terminated or revoked sooner.  Performed at [facility], [address] [place], [place] [address]          Radiology Studies: DG Cervical Spine 2-3 Views  Result Date: 10/02/2022 CLINICAL DATA:  Provided history: Anterior cervical decompression/discectomy with fusion of C4-C5. Provided fluoroscopy time: 2 seconds EXAM: CERVICAL SPINE - 2-3 VIEW COMPARISON:  Cervical spine MRI 10/01/2022. Cervical spine CT 10/01/2022. FINDINGS: Three intraoperative fluoroscopic images of the cervical spine are submitted. On the initial lateral projection fluoroscopic image, a metallic surgical instrument projects anterior to the C4-C5 disc space. On the subsequent fluoroscopic images acquired at 9:23 a.m., ACDF hardware and an interbody device are present at the C4-C5 level. A known acute fracture of the C2 body was better appreciated on the prior cervical spine CT of 10/01/2022. Partially imaged ET tube. IMPRESSION: Three intraoperative fluoroscopic images of the cervical spine from C4-C5 ACDF, as described. Electronically Signed   By: Kellie Simmering D.O.   On: 10/02/2022 09:47   DG C-Arm 1-60 Min-No Report  Result Date: 10/02/2022 Fluoroscopy was utilized by the requesting physician.  No radiographic interpretation.   MR Cervical Spine Wo Contrast  Result Date: 10/01/2022 CLINICAL DATA:  Neck trauma.  Fall EXAM: MRI CERVICAL SPINE WITHOUT CONTRAST TECHNIQUE: Multiplanar, multisequence MR imaging of the cervical spine was performed. No intravenous contrast was administered. COMPARISON:  CT cervical spine 10/03/2022 FINDINGS: Alignment: Mild anterolisthesis C3-4. Straightening of the cervical lordosis Vertebrae: No fracture or bone marrow  edema. Small cortical fracture of the anterior inferior C2 vertebral body is seen on CT but not appreciated on MRI. Cord: Bilateral cord hyperintensity at C4-5 which appears chronic and related to compressive myelopathy. No other cord signal abnormality Posterior Fossa, vertebral arteries, paraspinal tissues: Negative. No prevertebral soft tissue swelling or edema. Disc levels: C2-3: Negative for stenosis C3-4: Small central disc protrusion with calcification. Mild cord flattening and mild spinal stenosis. Mild foraminal narrowing bilaterally due to spurring. C4-5: Moderately large central disc protrusion with associated spurring. Cord flattening with moderate spinal stenosis. Small cord hyperintensity bilaterally which appears chronic. Moderate foraminal narrowing bilaterally due to spurring C5-6: Mild disc degeneration and spurring. Mild foraminal narrowing bilaterally. Mild central canal stenosis. C6-7: Mild disc degeneration. Moderate left foraminal narrowing due to spurring C7-T1: Negative IMPRESSION: 1. Nondisplaced fracture anterior inferior body of C2 not appreciated by MRI. No other fracture. No epidural hematoma. 2. Multilevel degenerative change most prominent at C4-5. Moderate spinal stenosis at C4-5 with bilateral cord hyperintensity compatible with chronic compressive myelopathy. Electronically Signed   By: Franchot Gallo M.D.   On: 10/01/2022 20:17   CT Head Wo Contrast  Result Date: 10/01/2022 CLINICAL DATA:  Fall with laceration to right cheek EXAM: CT HEAD WITHOUT CONTRAST CT MAXILLOFACIAL WITHOUT CONTRAST CT CERVICAL SPINE WITHOUT CONTRAST TECHNIQUE: Multidetector CT imaging of the head, cervical spine, and maxillofacial structures were performed using the standard protocol without intravenous contrast. Multiplanar CT image reconstructions of the cervical spine and maxillofacial structures were also generated. RADIATION DOSE REDUCTION: This exam was performed according to the departmental  dose-optimization program which includes automated exposure control, adjustment of the mA and/or kV according to patient size and/or use of iterative reconstruction technique. COMPARISON:  CT 08/30/2021 FINDINGS: CT HEAD FINDINGS Brain: No acute territorial infarction, hemorrhage or intracranial mass. Patchy white matter hypodensity likely chronic small vessel ischemic change. The ventricles are nonenlarged. Vascular: No hyperdense vessels.  Carotid vascular calcification Skull: Normal. Negative for fracture or focal lesion. Other: Opacified right maxillary sinus. Mucosal thickening in the ethmoid sinuses CT MAXILLOFACIAL FINDINGS Osseous: Mastoid air cells are clear. Mandibular heads are normally position. No mandibular fracture. Pterygoid plates and zygomatic arches appear intact. No acute nasal bone fracture Orbits: Negative. No traumatic or inflammatory finding. Sinuses: Completely opacified right maxillary sinus. Moderate mucosal thickening in the ethmoid sinuses. No sinus wall fracture. Soft tissues: Moderate to large right periorbital and infraorbital soft tissue hematoma with small foci of gas consistent with laceration CT CERVICAL SPINE FINDINGS Alignment: Straightening of the cervical spine. No subluxation. Facet alignment within normal limits. Skull base and vertebrae: Vertebral body heights are maintained. Suspected nondisplaced fracture lucency involving the anterior inferior corner of the C2 vertebral body, best seen on sagittal images. Correlate is seen on axial series 9, image 28. No other discrete fractures are visualized. Soft tissues and spinal canal: No significant prevertebral soft tissue enlargement. Disc levels: No abnormal widening or narrowing of C2-C3 disc space. There are multilevel degenerative changes. Moderate severe disc space narrowing and degenerative change C4-C5, C5-C6, C6-C7 and C7-T1. Facet degenerative changes at multiple levels with foraminal narrowing Upper chest: Negative.  Other: None IMPRESSION: 1. No CT evidence for acute intracranial abnormality. Patchy white matter hypodensity likely chronic small vessel ischemic change. 2. Findings consistent with nondisplaced teardrop type fracture involving the anterior inferior aspect of the C2 vertebral body. 3. Multilevel moderate severe degenerative changes Critical Value/emergent results were called by telephone at the time of interpretation on 10/01/2022 at 5:56 pm to provider [facility] , who verbally acknowledged these results. Electronically Signed   By: Donavan Foil M.D.   On: 10/01/2022 17:56   CT Cervical Spine Wo Contrast  Result Date: 10/01/2022 CLINICAL DATA:  Fall with laceration to right cheek EXAM: CT HEAD WITHOUT CONTRAST CT MAXILLOFACIAL WITHOUT CONTRAST CT CERVICAL SPINE WITHOUT CONTRAST TECHNIQUE: Multidetector CT imaging of the head, cervical spine, and maxillofacial structures were performed using the standard protocol without intravenous contrast. Multiplanar CT image reconstructions of the cervical spine and maxillofacial structures were also generated. RADIATION DOSE REDUCTION: This exam was performed according to the departmental dose-optimization program which includes automated exposure control, adjustment of the mA and/or kV according to patient size and/or use of iterative reconstruction technique. COMPARISON:  CT 08/30/2021 FINDINGS: CT HEAD FINDINGS Brain: No acute territorial infarction, hemorrhage or intracranial mass. Patchy white matter hypodensity likely chronic small vessel ischemic change. The ventricles are nonenlarged. Vascular: No hyperdense vessels.  Carotid vascular calcification Skull: Normal. Negative for fracture or focal lesion. Other: Opacified right maxillary sinus. Mucosal thickening in the ethmoid sinuses CT MAXILLOFACIAL FINDINGS Osseous: Mastoid air cells are clear. Mandibular heads are normally position. No mandibular fracture. Pterygoid plates  and zygomatic arches appear intact.  No acute nasal bone fracture Orbits: Negative. No traumatic or inflammatory finding. Sinuses: Completely opacified right maxillary sinus. Moderate mucosal thickening in the ethmoid sinuses. No sinus wall fracture. Soft tissues: Moderate to large right periorbital and infraorbital soft tissue hematoma with small foci of gas consistent with laceration CT CERVICAL SPINE FINDINGS Alignment: Straightening of the cervical spine. No subluxation. Facet alignment within normal limits. Skull base and vertebrae: Vertebral body heights are maintained. Suspected nondisplaced fracture lucency involving the anterior inferior corner of the C2 vertebral body, best seen on sagittal images. Correlate is seen on axial series 9, image 28. No other discrete fractures are visualized. Soft tissues and spinal canal: No significant prevertebral soft tissue enlargement. Disc levels: No abnormal widening or narrowing of C2-C3 disc space. There are multilevel degenerative changes. Moderate severe disc space narrowing and degenerative change C4-C5, C5-C6, C6-C7 and C7-T1. Facet degenerative changes at multiple levels with foraminal narrowing Upper chest: Negative. Other: None IMPRESSION: 1. No CT evidence for acute intracranial abnormality. Patchy white matter hypodensity likely chronic small vessel ischemic change. 2. Findings consistent with nondisplaced teardrop type fracture involving the anterior inferior aspect of the C2 vertebral body. 3. Multilevel moderate severe degenerative changes Critical Value/emergent results were called by telephone at the time of interpretation on 10/01/2022 at 5:56 pm to provider [facility] , who verbally acknowledged these results. Electronically Signed   By: Donavan Foil M.D.   On: 10/01/2022 17:56   CT Maxillofacial WO CM  Result Date: 10/01/2022 CLINICAL DATA:  Fall with laceration to right cheek EXAM: CT HEAD WITHOUT CONTRAST CT MAXILLOFACIAL WITHOUT CONTRAST CT CERVICAL SPINE WITHOUT CONTRAST  TECHNIQUE: Multidetector CT imaging of the head, cervical spine, and maxillofacial structures were performed using the standard protocol without intravenous contrast. Multiplanar CT image reconstructions of the cervical spine and maxillofacial structures were also generated. RADIATION DOSE REDUCTION: This exam was performed according to the departmental dose-optimization program which includes automated exposure control, adjustment of the mA and/or kV according to patient size and/or use of iterative reconstruction technique. COMPARISON:  CT 08/30/2021 FINDINGS: CT HEAD FINDINGS Brain: No acute territorial infarction, hemorrhage or intracranial mass. Patchy white matter hypodensity likely chronic small vessel ischemic change. The ventricles are nonenlarged. Vascular: No hyperdense vessels.  Carotid vascular calcification Skull: Normal. Negative for fracture or focal lesion. Other: Opacified right maxillary sinus. Mucosal thickening in the ethmoid sinuses CT MAXILLOFACIAL FINDINGS Osseous: Mastoid air cells are clear. Mandibular heads are normally position. No mandibular fracture. Pterygoid plates and zygomatic arches appear intact. No acute nasal bone fracture Orbits: Negative. No traumatic or inflammatory finding. Sinuses: Completely opacified right maxillary sinus. Moderate mucosal thickening in the ethmoid sinuses. No sinus wall fracture. Soft tissues: Moderate to large right periorbital and infraorbital soft tissue hematoma with small foci of gas consistent with laceration CT CERVICAL SPINE FINDINGS Alignment: Straightening of the cervical spine. No subluxation. Facet alignment within normal limits. Skull base and vertebrae: Vertebral body heights are maintained. Suspected nondisplaced fracture lucency involving the anterior inferior corner of the C2 vertebral body, best seen on sagittal images. Correlate is seen on axial series 9, image 28. No other discrete fractures are visualized. Soft tissues and spinal  canal: No significant prevertebral soft tissue enlargement. Disc levels: No abnormal widening or narrowing of C2-C3 disc space. There are multilevel degenerative changes. Moderate severe disc space narrowing and degenerative change C4-C5, C5-C6, C6-C7 and C7-T1. Facet degenerative changes at multiple levels with foraminal narrowing Upper chest: Negative.  Other: None IMPRESSION: 1. No CT evidence for acute intracranial abnormality. Patchy white matter hypodensity likely chronic small vessel ischemic change. 2. Findings consistent with nondisplaced teardrop type fracture involving the anterior inferior aspect of the C2 vertebral body. 3. Multilevel moderate severe degenerative changes Critical Value/emergent results were called by telephone at the time of interpretation on 10/01/2022 at 5:56 pm to provider [facility] , who verbally acknowledged these results. Electronically Signed   By: Donavan Foil M.D.   On: 10/01/2022 17:56   DG Chest 1 View  Result Date: 10/01/2022 CLINICAL DATA:  [place] EXAM: CHEST  1 VIEW COMPARISON:  08/30/2021 FINDINGS: Single frontal view of the chest demonstrates a right chest wall port via internal jugular approach, tip overlying the atriocaval junction. Cardiac silhouette is unremarkable. No acute airspace disease, effusion, or pneumothorax. There are no acute bony abnormalities. IMPRESSION: 1. No acute intrathoracic process. Electronically Signed   By: Randa Ngo M.D.   On: 10/01/2022 17:54   DG Knee Complete 4 Views Left  Result Date: 10/01/2022 CLINICAL DATA:  [place], left leg deformity EXAM: LEFT KNEE - COMPLETE 4+ VIEW COMPARISON:  12/03/2016 FINDINGS: Frontal, bilateral oblique, and cross-table lateral views of the left knee are obtained. No acute displaced fracture, subluxation, or dislocation. Moderate 3 compartmental osteoarthritis greatest in the patellofemoral compartment. Synovial osteochondromatosis again noted. Small joint effusion. Prepatellar soft tissue edema.  IMPRESSION: 1. No acute displaced fracture. 2. Moderate osteoarthritis with likely reactive joint effusion. 3. Prepatellar soft tissue edema. Electronically Signed   By: Randa Ngo M.D.   On: 10/01/2022 17:53   DG Hip Unilat With Pelvis 2-3 Views Left  Result Date: 10/01/2022 CLINICAL DATA:  [place], left leg deformity EXAM: DG HIP (WITH OR WITHOUT PELVIS) 2-3V LEFT COMPARISON:  None Available. FINDINGS: Frontal view of the pelvis as well as a frontal and cross-table lateral view of the left hip are obtained. There is internal rotation of the left hip, with no acute fracture, subluxation, or dislocation. Symmetrical bilateral hip osteoarthritis. Soft tissues are unremarkable. Sacroiliac joints are normal. IMPRESSION: 1. No acute displaced fracture. If fracture remains a clinical concern, CT or MRI be performed. 2. Symmetrical bilateral hip osteoarthritis. Electronically Signed   By: Randa Ngo M.D.   On: 10/01/2022 17:52   CT HIP LEFT WO CONTRAST  Result Date: 10/01/2022 CLINICAL DATA:  Blunt facial trauma, fall striking bed rail. Shortening of left leg. EXAM: CT OF THE LEFT HIP WITHOUT CONTRAST TECHNIQUE: Multidetector CT imaging of the left hip was performed according to the standard protocol. Multiplanar CT image reconstructions were also generated. RADIATION DOSE REDUCTION: This exam was performed according to the departmental dose-optimization program which includes automated exposure control, adjustment of the mA and/or kV according to patient size and/or use of iterative reconstruction technique. COMPARISON:  Radiographs 10/01/2022 FINDINGS: Bones/Joint/Cartilage Moderate to prominent loss of articular space in the left hip with marginal spurring along the acetabulum and femoral head. No fracture or acute bony findings identified. Ligaments Suboptimally assessed by CT. Muscles and Tendons Unremarkable Soft tissues Arterial atherosclerosis. IMPRESSION: 1. No fracture or acute bony findings. 2.  Moderate to prominent left hip osteoarthritis. 3. Arterial atherosclerosis. Electronically Signed   By: Van Clines M.D.   On: 10/01/2022 17:48    Scheduled Meds:  amLODipine  2.5 mg Oral Daily   atorvastatin  40 mg Oral Daily   escitalopram  20 mg Oral Daily   fluticasone furoate-vilanterol  1 puff Inhalation Daily   And   umeclidinium  bromide  1 puff Inhalation Daily   gabapentin  300 mg Oral BID   insulin aspart  0-15 Units Subcutaneous Q4H   insulin glargine-yfgn  8 Units Subcutaneous Q2200   patiromer  8.4 g Oral Daily   Continuous Infusions:   LOS: 2 days   Time spent: 35 mins  Zeev Deakins, MD [facility]   If 7PM-7AM, please contact night-coverage

## 2022-10-03 NOTE — Consult Note (Signed)
Referring Physician:  No referring provider defined for this encounter.  Primary Physician:  Janice Short   10/03/2022   History of Present Illness:  Janice Short is here today with a chief complaint of mechanical fall yesterday.  She struck her face on a rail, and had immediate onset of change in her strength in her upper extremities.  She also had difficulty moving her left hand and right hand.  She lost strength in her left arm.  She was unable to get herself up from her fall, and was then brought in for evaluation. During evaluation, she was noted to have C2 fracture as well as severe stenosis at C4-5 with myelomalacia.  DOS: 10/02/22 - C4-5 ACDF  Interval History: Janice Short went to surgery yesterday for a C4-5 ACDF.  She is doing well.  Her blood pressure has been automapping. Pain is improved. Feels that she's been neurologically stable. Has appetite, but has not yet tried to eat.   Review of Systems:  A 10 point review of systems is negative, except for the pertinent positives and negatives detailed in the HPI.  Past Medical History: Past Medical History:  Diagnosis Date   Allergy    Asthma    Carpal tunnel syndrome on left    Cervical radiculitis    Chronic osteoarthritis    Corns and callosity    Depression    Dermatophytosis of foot    Diabetes mellitus without complication (HCC)    Gout    Hyperlipidemia    Hypertension    Impingement syndrome of left shoulder    Intrahepatic cholangiocarcinoma (HCC)    Lumbosacral neuritis    Microalbuminuria    Obesity    Supraventricular tachycardia 07/01/2015   SVT (supraventricular tachycardia)    Tenosynovitis of wrist    Vitamin D deficiency     Past Surgical History: Past Surgical History:  Procedure Laterality Date   ABDOMINAL HYSTERECTOMY     ANTERIOR CERVICAL DECOMP/DISCECTOMY FUSION N/A 10/02/2022   Procedure: ANTERIOR CERVICAL DECOMPRESSION/DISCECTOMY FUSION 1 LEVEL;  Surgeon: Janice Maw,  Short;  Location: [facility] ORS;  Service: Neurosurgery;  Laterality: N/A;  ACDF C4-5.   COLONOSCOPY WITH PROPOFOL N/A 05/06/2021   Procedure: COLONOSCOPY WITH PROPOFOL;  Surgeon: Janice Short;  Location: [facility] ENDOSCOPY;  Service: Endoscopy;  Laterality: N/A;   ESOPHAGOGASTRODUODENOSCOPY (EGD) WITH PROPOFOL N/A 05/06/2021   Procedure: ESOPHAGOGASTRODUODENOSCOPY (EGD) WITH PROPOFOL;  Surgeon: Janice Short;  Location: [facility] ENDOSCOPY;  Service: Endoscopy;  Laterality: N/A;   PORTA CATH INSERTION N/A 07/17/2021   Procedure: PORTA CATH INSERTION;  Surgeon: Janice Short;  Location: [facility];  Service: Cardiovascular;  Laterality: N/A;    Allergies: Allergies as of 10/01/2022 - Review Complete 10/01/2022  Allergen Reaction Noted   Other Shortness Of Breath 09/01/2016   Cisplatin Itching 11/10/2021   Bydureon [exenatide] Other (See Comments) 06/18/2017    Medications: Current Meds  Medication Sig   albuterol (VENTOLIN HFA) 108 (90 Base) MCG/ACT inhaler INHALE TWO PUFFS BY MOUTH EVERY 6 HOURS AS NEEDED FOR WHEEZING AND FOR SHORTNESS OF BREATH (BULK)   aspirin 81 MG tablet Take 1 tablet by mouth daily.   atorvastatin (LIPITOR) 40 MG tablet Take 1 tablet (40 mg total) by mouth daily.   blood glucose meter kit and supplies Dispense based on patient and insurance preference. Use up to four times daily as directed. (FOR ICD-10 E10.9, E11.9).   diphenhydramine-acetaminophen (TYLENOL PM) 25-500 MG TABS tablet Take 1 tablet  by mouth at bedtime as needed.   escitalopram (LEXAPRO) 20 MG tablet TAKE ONE TABLET BY MOUTH DAILY AT 9AM   fluticasone (FLONASE) 50 MCG/ACT nasal spray Place 2 sprays into both nostrils daily as needed. USE 2 SPRAY(S) IN EACH NOSTRIL AT BEDTIME   Fluticasone-Umeclidin-Vilant (TRELEGY ELLIPTA) 100-62.5-25 MCG/ACT AEPB Inhale 1 puff into the lungs daily. (Patient taking differently: Inhale 1 puff into the lungs daily.)   gabapentin (NEURONTIN) 300 MG capsule TAKE TWO  CAPSULES BY MOUTH TWICE DAILY @ 9AM & 5PM   insulin degludec (TRESIBA FLEXTOUCH) 100 UNIT/ML FlexTouch Pen Inject 20 Units into the skin daily.   lidocaine-prilocaine (EMLA) cream Apply 1 Application topically as needed. Apply to port and cover with saran wrap 1-2 hours prior to port access    Social History: Social History   Tobacco Use   Smoking status: Some Days    Types: Cigarettes   Smokeless tobacco: Never   Tobacco comments:    1 per day 01/30/21 does not inhale it  Vaping Use   Vaping Use: Never used  Substance Use Topics   Alcohol use: Not Currently   Drug use: Not Currently    Types: Benzodiazepines    Family Medical History: Family History  Problem Relation Age of Onset   Heart attack Mother    CAD Mother    Kidney disease Father     Physical Examination: Vitals:   10/03/22 0324 10/03/22 0728  BP: (!) 141/68 (!) 154/71  Pulse: 89 96  Resp: 20 20  Temp: 98.2 F (36.8 C) 98.2 F (36.8 C)  SpO2: 99% 95%    General: Patient is well developed, well nourished, calm, collected, and in no apparent distress. Attention to examination is appropriate.  Neck: Incision clean dry and flat.   Respiratory: Patient is breathing without any difficulty.  Heart sounds normal no MRG. Chest Clear to Auscultation Bilaterally.  NEUROLOGICAL:     Awake, alert, oriented to person, place, and time.  Speech is clear and fluent. Fund of knowledge is appropriate.  Laceration on R cheek sewn together.  Cranial Nerves: Pupils equal round and reactive to light.  Facial tone is symmetric.  Facial sensation is symmetric. Shoulder shrug is symmetric. Tongue protrusion is midline.  She cannot perform drift.   Strength: Some pain with movements this morning limiting full participatory examination   Side Biceps Triceps Deltoid Interossei Grip Wrist Ext. Wrist Flex.  R 4 4 4- 4- 4- 4- 4-  L 4- _0 Side Iliopsoas Quads Hamstring PF DF EHL  R _1 L _2 Reflexes are 2+ and symmetric at the biceps, triceps, brachioradialis, patella and achilles.   Hoffman's is present.   Bilateral upper and lower extremity sensation is intact to light touch except forearm and hands, which are tingling and have diminished sensation.    Gait is untested   Medical Decision Making  Imaging: MRI C spine 10/01/22 IMPRESSION: 1. Nondisplaced fracture anterior inferior body of C2 not appreciated by MRI. No other fracture. No epidural hematoma. 2. Multilevel degenerative change most prominent at C4-5. Moderate spinal stenosis at C4-5 with bilateral cord hyperintensity compatible with chronic compressive myelopathy.     Electronically Signed   By: Franchot Gallo M.D.   On: 10/01/2022 20:17  CT C spine 10/01/22 IMPRESSION: 1. No CT evidence for acute intracranial abnormality. Patchy white matter hypodensity likely chronic small vessel  ischemic change. 2. Findings consistent with nondisplaced teardrop type fracture involving the anterior inferior aspect of the C2 vertebral body. 3. Multilevel moderate severe degenerative changes   Critical Value/emergent results were called by telephone at the time of interpretation on 10/01/2022 at 5:56 pm to provider [facility] , who verbally acknowledged these results.     Electronically Signed   By: Donavan Foil M.D.   On: 10/01/2022 17:56  I have personally reviewed the images and agree with the above interpretation.  Assessment and Plan: Janice Short is a pleasant 61 y.o. female with C2 fracture that appears to be stable.  She has an incomplete spinal cord injury consistent with a central cord injury.  She has significant weakness in her upper extremities that was not present prior to her fall.  She has gotten slightly better, but is still profoundly weak.  She has severe stenosis at C4-5 with myelomalacia at this level.  She underwent anterior cervical discectomy and fusion on 10/02/2022.  Neurologically she has  shown some slight improvements in her left upper extremity mostly in the distal muscle groups.  She does continue to have pain.  Pain control per primary Continue to monitor blood pressure.  Currently auto mapping greater than 80 Advance diet as tolerated. We will recommend physical therapy evaluation and management.   Stevan Born, Short, [facility]

## 2022-10-04 DIAGNOSIS — S12101A Unspecified nondisplaced fracture of second cervical vertebra, initial encounter for closed fracture: Secondary | ICD-10-CM | POA: Diagnosis not present

## 2022-10-04 LAB — BASIC METABOLIC PANEL
Anion gap: 6 (ref 5–15)
BUN: 20 mg/dL (ref 8–23)
CO2: 22 mmol/L (ref 22–32)
Calcium: 7.8 mg/dL — ABNORMAL LOW (ref 8.9–10.3)
Chloride: 108 mmol/L (ref 98–111)
Creatinine, Ser: 1.15 mg/dL — ABNORMAL HIGH (ref 0.44–1.00)
GFR, Estimated: 54 mL/min — ABNORMAL LOW (ref >60)
Glucose, Bld: 125 mg/dL — ABNORMAL HIGH (ref 70–99)
Potassium: 4.3 mmol/L (ref 3.5–5.1)
Sodium: 136 mmol/L (ref 135–145)

## 2022-10-04 LAB — GLUCOSE, CAPILLARY
Glucose-Capillary: 123 mg/dL — ABNORMAL HIGH (ref 70–99)
Glucose-Capillary: 133 mg/dL — ABNORMAL HIGH (ref 70–99)
Glucose-Capillary: 142 mg/dL — ABNORMAL HIGH (ref 70–99)
Glucose-Capillary: 160 mg/dL — ABNORMAL HIGH (ref 70–99)
Glucose-Capillary: 198 mg/dL — ABNORMAL HIGH (ref 70–99)
Glucose-Capillary: 232 mg/dL — ABNORMAL HIGH (ref 70–99)

## 2022-10-04 LAB — CBC
HCT: 30 % — ABNORMAL LOW (ref 36.0–46.0)
Hemoglobin: 9.5 g/dL — ABNORMAL LOW (ref 12.0–15.0)
MCH: 30.5 pg (ref 26.0–34.0)
MCHC: 31.7 g/dL (ref 30.0–36.0)
MCV: 96.5 fL (ref 80.0–100.0)
Platelets: 200 10*3/uL (ref 150–400)
RBC: 3.11 MIL/uL — ABNORMAL LOW (ref 3.87–5.11)
RDW: 20.3 % — ABNORMAL HIGH (ref 11.5–15.5)
WBC: 12.6 10*3/uL — ABNORMAL HIGH (ref 4.0–10.5)
nRBC: 0 % (ref 0.0–0.2)

## 2022-10-04 NOTE — Progress Notes (Signed)
PROGRESS NOTE    Janice Short  UKG:[id number] DOB: Aug 15, 1961 DOA: 10/01/2022 PCP: Steele Sizer, MD   Brief Narrative:  This 60 years old female with PMH significant for IDDM, chronic bronchitis, intrahepatic cholangiocarcinoma on chemotherapy, last infusion on 09/18/2022 presented in the ED s/p accidental fall.  She was unable to stand up.  She sustained a laceration on the left cheek and complained of left hip pain and bilateral upper extremity numbness since the fall.  Imaging in the ED shows nondisplaced fracture of anterior inferior body of C2.  No epidural hematoma.  Multilevel degenerative changes most prominent at C4 and C5. Patient was placed on cervical collar. ED physician has spoken with neurosurgeon Dr. Cari Caraway who recommended ACDF for acute spinal contusion at C4-C5 due to severe spinal stenosis.  Patient is admitted for further management.  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 She suffered central cord injury causing central cord syndrome status post fall. Patient was evaluated by Dr. Cari Caraway who recommended ACDF. Continue cervical collar. Patient is s/p ACDF POD # 3 Continue adequate pain control. She has slight improvement in pain level/ strength Reassessed by neurosurgery recommended  pain control and PT and OT eval.   Left hip pain: S/p accidental fall. X-ray hip shows no acute fracture. Continue adequate pain control.  Cheek lacerations without complication: Lacerations sutured in ED. Continue wound care.  CKD stage IIIa: Serum creatinine at baseline. Avoid nephrotoxic medications.   Hyperkalemia: > Resolved. Potassium 5.7. Lokelma x1 given. Potassium normalized 4.5  Chronic bronchitis: Continue home inhalers. DuoNebs as needed. Continue Trelegy Ellipta.   Type 2 diabetes with hyperglycemia: Sliding scale insulin coverage Continue Semglee 8 units at bedtime. Carb modified diet   Essential HTN: Continue amlodipine 2.5 mg daily. Blood pressure has improved.   DVT prophylaxis: SCDs Code Status: Full code Family Communication: No family at bedside. Disposition Plan:  Status is: Inpatient Remains inpatient appropriate because:    Admitted s/p accidental fall with central cord injury causing central cord syndrome.  Neurosurgery consulted patient underwent s/p ACDF POD 3, PT and OT evaluation pending.  Consultants:  Neurosurgery  Procedures: S/p ACDF Antimicrobials: None  Subjective: Patient was seen and examined at the bedside.  Overnight events noted She is S/p ACDF POD # 3, lying comfortably in bed, states she still has pain in the neck. She reports improvement in pain and weakness.  Objective: Vitals:   10/03/22 2337 10/04/22 0455 10/04/22 0700 10/04/22 1118  BP: 106/69 (!) 129/57 135/62 134/63  Pulse: (!) 102 98 91 93  Resp: 18 18 18 18   Temp: 98.3 F (36.8 C) 100.2 F (37.9 C) 98.9 F (37.2 C) 99.3 F (37.4 C)  TempSrc:      SpO2: 100% 100% 100% 99%  Weight:      Height:  Intake/Output Summary (Last 24 hours) at 10/04/2022 1126 Last data filed at 10/04/2022 0602 Gross per 24 hour  Intake --  Output 1425 ml  Net -1425 ml   Filed Weights   10/01/22 1629 10/02/22 0716  Weight: 80 kg 80 kg     Examination:  General exam: Appears comfortable, not in any acute distress, deconditioned. Lacerations noted on the neck and the left cheek.  Stitches noted. Respiratory system: CTA bilaterally, respiratory effort normal, RR 15 Cardiovascular system: S1 & S2 heard, regular rate and rhythm, no murmur. Gastrointestinal system: Abdomen is soft, non tender, non distended, BS+ Central nervous system: Alert and oriented x 3. No focal neurological deficits. Back: Tenderness noted in the upper back, neck. Extremities: No edema, no cyanosis, no clubbing. Skin: No rashes, lesions or ulcers Psychiatry: Judgement and insight appear normal. Mood & affect appropriate.     Data Reviewed: I have personally reviewed following labs and imaging studies  CBC: Recent Labs  Lab 10/01/22 1631 10/03/22 0359 10/04/22 0611  WBC 12.2* 21.6* 12.6*  NEUTROABS 9.0*  --   --   HGB 9.7* 9.5* 9.5*  HCT 31.0* 29.9* 30.0*  MCV 99.0 98.0 96.5  PLT 97* 152 841   Basic Metabolic Panel: Recent Labs  Lab 10/01/22 1755 10/02/22 0553 10/03/22 0359 10/04/22 0611  NA 136 140 138 136  K 5.7* 4.5 4.5 4.3  CL 110 114* 113* 108  CO2 21* 21* 22 22  GLUCOSE 243* 131* 131* 125*  BUN 13 13 18 20   CREATININE 1.30* 1.25* 1.11* 1.15*  CALCIUM 7.9* 8.0* 8.5* 7.8*  MG  --   --  1.9  --   PHOS  --   --  2.6  --    GFR: Estimated Creatinine Clearance: 54.5 mL/min (A) (by C-G formula based on SCr of 1.15 mg/dL (H)). Liver Function Tests: No results for input(s): "AST", "ALT", "ALKPHOS", "BILITOT", "PROT", "ALBUMIN" in the last 168 hours. No results for input(s): "LIPASE", "AMYLASE" in the last 168 hours. No results for input(s): "AMMONIA" in the last 168 hours. Coagulation Profile: Recent Labs  Lab 10/01/22 2106  INR 1.2   Cardiac Enzymes: No results for input(s): "CKTOTAL", "CKMB", "CKMBINDEX", "TROPONINI" in the last 168 hours. BNP (last 3 results) No results for input(s): "PROBNP" in the last 8760  hours. HbA1C: No results for input(s): "HGBA1C" in the last 72 hours. CBG: Recent Labs  Lab 10/03/22 2020 10/04/22 0008 10/04/22 0457 10/04/22 0725 10/04/22 1118  GLUCAP 175* 160* 133* 142* 198*   Lipid Profile: No results for input(s): "CHOL", "HDL", "LDLCALC", "TRIG", "CHOLHDL", "LDLDIRECT" in the last 72 hours. Thyroid Function Tests: No results for input(s): "TSH", "T4TOTAL", "FREET4", "T3FREE", "THYROIDAB" in the last 72 hours. Anemia Panel: No results for input(s): "VITAMINB12", "FOLATE", "FERRITIN", "TIBC", "IRON", "RETICCTPCT" in the last 72 hours. Sepsis Labs: No results for input(s): "PROCALCITON", "LATICACIDVEN" in the last 168 hours.  Recent Results (from the past 240 hour(s))  SARS Coronavirus 2 by RT PCR (hospital order, performed in [facility] hospital lab) *cepheid single result test* Anterior Nasal Swab     Status: None   Collection Time: 10/01/22  4:31 PM   Specimen: Anterior Nasal Swab  Result Value Ref Range Status   SARS Coronavirus 2 by RT PCR NEGATIVE NEGATIVE Final    Comment: (NOTE) SARS-CoV-2 target nucleic acids are NOT DETECTED.  The SARS-CoV-2 RNA is generally detectable in upper and lower respiratory specimens during the acute phase of infection. The lowest concentration of SARS-CoV-2  viral copies this assay can detect is 250 copies / mL. A negative result does not preclude SARS-CoV-2 infection and should not be used as the sole basis for treatment or other patient management decisions.  A negative result may occur with improper specimen collection / handling, submission of specimen other than nasopharyngeal swab, presence of viral mutation(s) within the areas targeted by this assay, and inadequate number of viral copies (<250 copies / mL). A negative result must be combined with clinical observations, patient history, and epidemiological information.  Fact Sheet for Patients:   https://www.patel.info/  Fact Sheet for  Healthcare Providers: https://hall.com/  This test is not yet approved or  cleared by the [place] FDA and has been authorized for detection and/or diagnosis of SARS-CoV-2 by FDA under an Emergency Use Authorization (EUA).  This EUA will remain in effect (meaning this test can be used) for the duration of the COVID-19 declaration under Section 564(b)(1) of the Act, 21 U.S.C. section 360bbb-3(b)(1), unless the authorization is terminated or revoked sooner.  Performed at [facility], [address] [place], [place] [address]     Radiology Studies: No results found.  Scheduled Meds:  amLODipine  2.5 mg Oral Daily   atorvastatin  40 mg Oral Daily   escitalopram  20 mg Oral Daily   fluticasone furoate-vilanterol  1 puff Inhalation Daily   And   umeclidinium bromide  1 puff Inhalation Daily   gabapentin  300 mg Oral BID   insulin aspart  0-15 Units Subcutaneous TID AC & HS   insulin glargine-yfgn  8 Units Subcutaneous Q2200   patiromer  8.4 g Oral Daily   Continuous Infusions:   LOS: 3 days   Time spent: 35 mins  Isael Stille, MD [facility]   If 7PM-7AM, please contact night-coverage

## 2022-10-04 NOTE — Progress Notes (Signed)
Referring Physician:  No referring provider defined for this encounter.  Primary Physician:  Steele Sizer, MD   10/04/2022   History of Present Illness:  Janice Short is here today with a chief complaint of mechanical fall yesterday.  She struck her face on a rail, and had immediate onset of change in her strength in her upper extremities.  She also had difficulty moving her left hand and right hand.  She lost strength in her left arm.  She was unable to get herself up from her fall, and was then brought in for evaluation. During evaluation, she was noted to have C2 fracture as well as severe stenosis at C4-5 with myelomalacia.  DOS: 10/02/22 - C4-5 ACDF  Interval History: Janice Short went to surgery yesterday for a C4-5 ACDF.  She has been able to tolerate some p.o. intake but her appetite is low.  She has some mild soreness with her throat but is able to swallow.  She has not noticed any swelling in her incision.  She does feel that she is improving slowly.  Review of Systems:  A 10 point review of systems is negative, except for the pertinent positives and negatives detailed in the HPI.  Past Medical History: Past Medical History:  Diagnosis Date   Allergy    Asthma    Carpal tunnel syndrome on left    Cervical radiculitis    Chronic osteoarthritis    Corns and callosity    Depression    Dermatophytosis of foot    Diabetes mellitus without complication (HCC)    Gout    Hyperlipidemia    Hypertension    Impingement syndrome of left shoulder    Intrahepatic cholangiocarcinoma (HCC)    Lumbosacral neuritis    Microalbuminuria    Obesity    Supraventricular tachycardia 07/01/2015   SVT (supraventricular tachycardia)    Tenosynovitis of wrist    Vitamin D deficiency     Past Surgical History: Past Surgical History:  Procedure Laterality Date   ABDOMINAL HYSTERECTOMY     ANTERIOR CERVICAL DECOMP/DISCECTOMY FUSION N/A 10/02/2022   Procedure: ANTERIOR CERVICAL  DECOMPRESSION/DISCECTOMY FUSION 1 LEVEL;  Surgeon: Meade Maw, MD;  Location: [facility] ORS;  Service: Neurosurgery;  Laterality: N/A;  ACDF C4-5.   COLONOSCOPY WITH PROPOFOL N/A 05/06/2021   Procedure: COLONOSCOPY WITH PROPOFOL;  Surgeon: Lucilla Lame, MD;  Location: [facility] ENDOSCOPY;  Service: Endoscopy;  Laterality: N/A;   ESOPHAGOGASTRODUODENOSCOPY (EGD) WITH PROPOFOL N/A 05/06/2021   Procedure: ESOPHAGOGASTRODUODENOSCOPY (EGD) WITH PROPOFOL;  Surgeon: Lucilla Lame, MD;  Location: [facility] ENDOSCOPY;  Service: Endoscopy;  Laterality: N/A;   PORTA CATH INSERTION N/A 07/17/2021   Procedure: PORTA CATH INSERTION;  Surgeon: Algernon Huxley, MD;  Location: [place] CV LAB;  Service: Cardiovascular;  Laterality: N/A;    Allergies: Allergies as of 10/01/2022 - Review Complete 10/01/2022  Allergen Reaction Noted   Other Shortness Of Breath 09/01/2016   Cisplatin Itching 11/10/2021   Bydureon [exenatide] Other (See Comments) 06/18/2017    Medications: Current Meds  Medication Sig   albuterol (VENTOLIN HFA) 108 (90 Base) MCG/ACT inhaler INHALE TWO PUFFS BY MOUTH EVERY 6 HOURS AS NEEDED FOR WHEEZING AND FOR SHORTNESS OF BREATH (BULK)   aspirin 81 MG tablet Take 1 tablet by mouth daily.   atorvastatin (LIPITOR) 40 MG tablet Take 1 tablet (40 mg total) by mouth daily.   blood glucose meter kit and supplies Dispense based on patient and insurance preference. Use up to four times daily as  directed. (FOR ICD-10 E10.9, E11.9).   diphenhydramine-acetaminophen (TYLENOL PM) 25-500 MG TABS tablet Take 1 tablet by mouth at bedtime as needed.   escitalopram (LEXAPRO) 20 MG tablet TAKE ONE TABLET BY MOUTH DAILY AT 9AM   fluticasone (FLONASE) 50 MCG/ACT nasal spray Place 2 sprays into both nostrils daily as needed. USE 2 SPRAY(S) IN EACH NOSTRIL AT BEDTIME   Fluticasone-Umeclidin-Vilant (TRELEGY ELLIPTA) 100-62.5-25 MCG/ACT AEPB Inhale 1 puff into the lungs daily. (Patient taking differently: Inhale 1 puff into  the lungs daily.)   gabapentin (NEURONTIN) 300 MG capsule TAKE TWO CAPSULES BY MOUTH TWICE DAILY @ 9AM & 5PM   insulin degludec (TRESIBA FLEXTOUCH) 100 UNIT/ML FlexTouch Pen Inject 20 Units into the skin daily.   lidocaine-prilocaine (EMLA) cream Apply 1 Application topically as needed. Apply to port and cover with saran wrap 1-2 hours prior to port access    Social History: Social History   Tobacco Use   Smoking status: Some Days    Types: Cigarettes   Smokeless tobacco: Never   Tobacco comments:    1 per day 01/30/21 does not inhale it  Vaping Use   Vaping Use: Never used  Substance Use Topics   Alcohol use: Not Currently   Drug use: Not Currently    Types: Benzodiazepines    Family Medical History: Family History  Problem Relation Age of Onset   Heart attack Mother    CAD Mother    Kidney disease Father     Physical Examination: Vitals:   10/04/22 1557 10/04/22 2109  BP: 130/64 137/61  Pulse: 98 (!) 106  Resp: 18 20  Temp: 99.5 F (37.5 C) 98.8 F (37.1 C)  SpO2: 96% 100%    General: Patient is calm collected, and in no apparent distress. Attention to examination is appropriate.  Neck: Incision clean dry and flat.   Respiratory: Patient is breathing without any difficulty.  NEUROLOGICAL:     Awake, alert, oriented to person, place.  Speech is clear and fluent. Fund of knowledge is appropriate.  Laceration on R cheek sewn together.  Cranial Nerves: Pupils equal round and reactive to light.  Facial tone is symmetric.  Facial sensation is symmetric. Shoulder shrug is symmetric. Tongue protrusion is midline.  She cannot perform drift.   Strength: Some pain with movements this morning limiting full participatory examination   Side Biceps Triceps Deltoid Interossei Grip Wrist Ext. Wrist Flex.  R 4 4 4- 4- 4- 4- 4-  L 4- _0 Side Iliopsoas Quads Hamstring PF DF EHL  R _1 L _2 Reflexes are 2+ and symmetric at the biceps,  triceps, brachioradialis, patella and achilles.   Hoffman's is present.   Bilateral upper and lower extremity sensation is intact to light touch except forearm and hands, which are tingling and have diminished sensation.    Gait is untested   Medical Decision Making  Imaging: MRI C spine 10/01/22 IMPRESSION: 1. Nondisplaced fracture anterior inferior body of C2 not appreciated by MRI. No other fracture. No epidural hematoma. 2. Multilevel degenerative change most prominent at C4-5. Moderate spinal stenosis at C4-5 with bilateral cord hyperintensity compatible with chronic compressive myelopathy.     Electronically Signed   By: Franchot Gallo M.D.   On: 10/01/2022 20:17  CT C spine 10/01/22 IMPRESSION: 1. No CT evidence for acute intracranial abnormality. Patchy white matter hypodensity likely chronic small vessel ischemic  change. 2. Findings consistent with nondisplaced teardrop type fracture involving the anterior inferior aspect of the C2 vertebral body. 3. Multilevel moderate severe degenerative changes   Critical Value/emergent results were called by telephone at the time of interpretation on 10/01/2022 at 5:56 pm to provider [facility] , who verbally acknowledged these results.     Electronically Signed   By: Donavan Foil M.D.   On: 10/01/2022 17:56  I have personally reviewed the images and agree with the above interpretation.  Assessment and Plan: Janice Short is a pleasant 61 y.o. female with C2 fracture that appears to be stable.  She has an incomplete spinal cord injury consistent with a central cord injury.  She has significant weakness in her upper extremities that was not present prior to her fall.  She has gotten slightly better, but is still profoundly weak.  She has severe stenosis at C4-5 with myelomalacia at this level.  She underwent anterior cervical discectomy and fusion on 10/02/2022.  Neurologically she has shown some slight improvements in her left upper  extremity mostly in the distal muscle groups.  She does continue to have pain.  Pain control per primary Continue to monitor blood pressure.  Currently auto mapping greater than 80 Advance diet as tolerated. We will recommend physical therapy evaluation and management. Only needs collar while OOB   Stevan Born, MD, [facility]

## 2022-10-04 NOTE — Plan of Care (Signed)

## 2022-10-04 NOTE — Progress Notes (Signed)
Inpatient Rehab Admissions Coordinator:  ? ?Per therapy recommendations,  patient was screened for CIR candidacy by Cheo Selvey, MS, CCC-SLP. At this time, Pt. Appears to be a a potential candidate for CIR. I will place   order for rehab consult per protocol for full assessment. Please contact me any with questions. ? ?Manveer Gomes, MS, CCC-SLP ?Rehab Admissions Coordinator  ?[phone] (celll) ?[phone] (office) ? ?

## 2022-10-04 NOTE — TOC CM/SW Note (Signed)
Went by room to discuss rehab venues. Patient moaning in pain. RN confirmed he has given pain medication. Will try again tomorrow.  Dayton Scrape, [place]

## 2022-10-04 NOTE — Evaluation (Signed)
Physical Therapy Evaluation Patient Details Name: Janice Short MRN: [id number] DOB: [birth year] Today's Date: 10/04/2022  History of Present Illness  Pt is a 61 y.o. female presenting to ED 10/26 s/p fall (head was hit on bed rail during fall).  Large laceration to face and shortened and externally rotate hip noted by EMS.  Imaging showing nondisplaced teardrop type fracture involving the anterior inferior aspect of the C2 vertebral body.  Pt admitted with closed fx of C2 vertebra; contusion of cervical cord C4-5 d/t accidental fall, L hip pain (x-ray hip showing no fx), laceration L cheek, hyperkalemia, and simple chronic bronchitis.  S/p ACDF C4-5 10/02/22.  PMH includes spinal stenosis, DM, metastatic intrahepatic cholangiocarcinoma on chemotherapy, htn, HLD, CTS.  Clinical Impression  Prior to hospital admission, pt reports being ambulatory (no AD use); lives with her daughter and 86 y.o. grandson in 1 level home with ramp to enter.  Pain 8-9/10 anterior neck during session (pt also reporting chronic low back and R knee pain--pt very tender to touch R knee)--nurse reports giving pt recent pain meds.  L UE significantly weaker than R UE; R LE appearing weaker than L LE (but also complicated d/t R knee pain).  Currently pt is max assist x2 for bed mobility; close SBA to mod assist for sitting balance; and only trace initiation noted with attempted sit to stand from bed with 2 assist (pt unable to clear bottom from bed).  Despite pain, pt motivated to participate in therapy.  Pt would benefit from skilled PT to address noted impairments and functional limitations (see below for any additional details).  Upon hospital discharge, pt would benefit from CIR.    Recommendations for follow up therapy are one component of a multi-disciplinary discharge planning process, led by the attending physician.  Recommendations may be updated based on patient status, additional functional criteria and insurance  authorization.  Follow Up Recommendations Acute inpatient rehab (3hours/day)      Assistance Recommended at Discharge Frequent or constant Supervision/Assistance  Patient can return home with the following  Two people to help with walking and/or transfers;Two people to help with bathing/dressing/bathroom;Assistance with cooking/housework;Assistance with feeding;Direct supervision/assist for medications management;Assist for transportation;Help with stairs or ramp for entrance    Equipment Recommendations Other (comment) (TBD at next facility)  Recommendations for Other Services  Rehab consult    Functional Status Assessment Patient has had a recent decline in their functional status and demonstrates the ability to make significant improvements in function in a reasonable and predictable amount of time.     Precautions / Restrictions Precautions Precautions: Cervical;Fall Precaution Booklet Issued: No Precaution Comments: Only needs brace when up and out of bed; may remove brace when in bed; apply/remove brace while sitting; may remove brace to shower; may ambulate to bathroom without brace; MAP >80 Required Braces or Orthoses: Cervical Brace Cervical Brace: Hard collar ([place] collar) Restrictions Weight Bearing Restrictions: No      Mobility  Bed Mobility Overal bed mobility: Needs Assistance Bed Mobility: Supine to Sit, Sit to Supine     Supine to sit: Max assist, +2 for physical assistance Sit to supine: Max assist, +2 for physical assistance   General bed mobility comments: assist for trunk and B LE's; assist for logrolling technique    Transfers Overall transfer level: Needs assistance Equipment used: None Transfers: Sit to/from Stand Sit to Stand: Total assist, +2 physical assistance           General transfer comment: trace initiation  noted for attempted sit to stand (did not clear pt's bottom from bed)    Ambulation/Gait                   Stairs            Wheelchair Mobility    Modified Rankin (Stroke Patients Only)       Balance Overall balance assessment: Needs assistance Sitting-balance support: Feet supported, Bilateral upper extremity supported Sitting balance-Leahy Scale: Poor Sitting balance - Comments: close SBA to mod assist; pt intermittently adjusting herself in bed leaning to R or leaning forward Postural control: Right lateral lean     Standing balance comment: unable to stand at this time                             Pertinent Vitals/Pain Pain Assessment Pain Assessment: 0-10 Pain Score: 9  Pain Location: 9/10 neck pain, chronic low back pain, R knee pain (tender to touch) Pain Descriptors / Indicators: Constant, Discomfort, Grimacing, Guarding, Sharp Pain Intervention(s): Limited activity within patient's tolerance, Monitored during session, Premedicated before session, Repositioned    Home Living Family/patient expects to be discharged to:: Private residence Living Arrangements: Children (daughter and 78 y.o. grandson) Available Help at Discharge: Family;Available PRN/intermittently Type of Home: Mobile home Home Access: Ramped entrance       Home Layout: One level Home Equipment: BSC/3in1;Cane - single point      Prior Function Prior Level of Function : Independent/Modified Independent;History of Falls (last six months)             Mobility Comments: Independent with ambulation; no other recent falls ADLs Comments: Independent     Hand Dominance        Extremity/Trunk Assessment   Upper Extremity Assessment Upper Extremity Assessment: RUE deficits/detail;LUE deficits/detail RUE Deficits / Details: fair grip strength, wrist extension/flexion 3/5, elbow flexion/extension at least 3/5, shoulder flexion and abduction at least 3/5 (at least 90 degrees) RUE: Unable to fully assess due to pain LUE Deficits / Details: 0/5 grip strength, 2-/5 wrist extension,  0/5 wrist flexion, 3-/5 elbow flexion (with pain in L elbow), 1/5 elbow extension, shoulder flexion 2-/5, shoulder abduction 3/5 (limited to <90 deg), compensating during testing with shoulder movement LUE: Unable to fully assess due to pain    Lower Extremity Assessment Lower Extremity Assessment: Generalized weakness;RLE deficits/detail;LLE deficits/detail RLE Deficits / Details: hip flexion at least 2+/5; knee flexion/extension at least 2/5 (limited d/t R knee pain); DF/PF at least 3/5 AROM RLE: Unable to fully assess due to pain LLE Deficits / Details: hip flexion at least 2+/5; knee flexion/extension at least 2+/5; DF/PF at least 3/5 AROM    Cervical / Trunk Assessment Cervical / Trunk Assessment: Neck Surgery  Communication   Communication: No difficulties  Cognition Arousal/Alertness: Awake/alert Behavior During Therapy: WFL for tasks assessed/performed Overall Cognitive Status: Within Functional Limits for tasks assessed                                 General Comments: Pt very motivated despite pain        General Comments General comments (skin integrity, edema, etc.): anterior neck surgical incision.  Nursing cleared pt for participation in physical therapy.  Pt agreeable to PT session.    Exercises     Assessment/Plan    PT Assessment Patient needs continued PT services  PT Problem  List Decreased strength;Decreased range of motion;Decreased activity tolerance;Decreased balance;Decreased mobility;Decreased knowledge of use of DME;Decreased coordination;Decreased knowledge of precautions;Pain;Decreased skin integrity       PT Treatment Interventions DME instruction;Gait training;Functional mobility training;Therapeutic activities;Therapeutic exercise;Balance training;Patient/family education    PT Goals (Current goals can be found in the Care Plan section)  Acute Rehab PT Goals Patient Stated Goal: to improve mobility PT Goal Formulation: With  patient Time For Goal Achievement: 10/18/22 Potential to Achieve Goals: Good    Frequency 7X/week     Co-evaluation PT/OT/SLP Co-Evaluation/Treatment: Yes Reason for Co-Treatment: Complexity of the patient's impairments (multi-system involvement);For patient/therapist safety;To address functional/ADL transfers PT goals addressed during session: Mobility/safety with mobility;Balance OT goals addressed during session: ADL's and self-care       AM-PAC PT "6 Clicks" Mobility  Outcome Measure Help needed turning from your back to your side while in a flat bed without using bedrails?: A Lot Help needed moving from lying on your back to sitting on the side of a flat bed without using bedrails?: Total Help needed moving to and from a bed to a chair (including a wheelchair)?: Total Help needed standing up from a chair using your arms (e.g., wheelchair or bedside chair)?: Total Help needed to walk in hospital room?: Total Help needed climbing 3-5 steps with a railing? : Total 6 Click Score: 7    End of Session Equipment Utilized During Treatment: Gait belt;Cervical collar Activity Tolerance: Patient limited by pain Patient left: in bed;with call bell/phone within reach;with bed alarm set;Other (comment) (B heels floating via pillow support) Nurse Communication: Mobility status;Precautions;Other (comment) (pt's pain status) PT Visit Diagnosis: Other abnormalities of gait and mobility (R26.89);Muscle weakness (generalized) (M62.81);History of falling (Z91.81);Pain Pain - Right/Left: Right Pain - part of body: Knee    Time: 8016-5537 PT Time Calculation (min) (ACUTE ONLY): 54 min   Charges:   PT Evaluation $PT Eval Low Complexity: 1 Low PT Treatments $Therapeutic Activity: 23-37 mins       Leitha Bleak, PT 10/04/22, 2:57 PM

## 2022-10-04 NOTE — Evaluation (Addendum)
Occupational Therapy Evaluation Patient Details Name: Janice Short MRN: [id number] DOB: Oct 02, 1961 Today's Date: 10/04/2022   History of Present Illness Janice Short is a 61 y/o female with PMH significant for IDDM, chronic bronchitis, intrahepatic cholangiocarcinoma on chemotherapy, last infusion on 09/18/2022 presented in the ED s/p accidental fall.  She was unable to stand up.  She sustained a laceration on the left cheek and complained of left hip pain and bilateral upper extremity numbness since the fall.  Imaging in the ED shows nondisplaced fracture of anterior inferior body of C2.  No epidural hematoma.  Multilevel degenerative changes most prominent at C4 and C5. Patient was placed on cervical collar. Now s/p anterior cervical discectomy and fusion at C4-5 on 10/27.   Clinical Impression   Pt agreeable to OT/PT co-treatment to maximize safety and participation. Pt presenting with deficits in self care, ROM, strength, balance, functional mobility/transfers, and endurance. At baseline, pt was independent in ADLs and functional mobility with no AD. Pt currently functioning at Max +2 for supine<>sit, Max A for grooming tasks, and Max A for LB dressing. Pt requiring to complete ADL tasks in sitting this date 2/2 inability to stand. Pt with significant pain throughout session (9/10), however, was still motivated to participate in therapy. RN aware. Pt will benefit from acute OT to increase overall independence in the areas of ADLs and functional mobility in order to safely discharge to next venue of care.  Upon hospital discharge, pt would benefit from CIR.     Recommendations for follow up therapy are one component of a multi-disciplinary discharge planning process, led by the attending physician.  Recommendations may be updated based on patient status, additional functional criteria and insurance authorization.   Follow Up Recommendations  Acute inpatient rehab (3hours/day)    Assistance  Recommended at Discharge Frequent or constant Supervision/Assistance  Patient can return home with the following Two people to help with walking and/or transfers;Two people to help with bathing/dressing/bathroom;Assistance with cooking/housework;Assistance with feeding;Direct supervision/assist for medications management;Assist for transportation;Help with stairs or ramp for entrance    Functional Status Assessment  Patient has had a recent decline in their functional status and demonstrates the ability to make significant improvements in function in a reasonable and predictable amount of time.  Equipment Recommendations  Other (comment) (defer to next venue of care)    Recommendations for Other Services       Precautions / Restrictions Precautions Precautions: Cervical;Fall Precaution Booklet Issued: No Precaution Comments: Only needs brace when up and out of bed; may remove brace when in bed; apply/remove brace while sitting; may remove brace to shower; may ambulate to bathroom without brace; MAP >80  Required Braces or Orthoses: Cervical Brace Cervical Brace: Hard collar ([place] J Collar) Restrictions Weight Bearing Restrictions: No      Mobility Bed Mobility Overal bed mobility: Needs Assistance Bed Mobility: Supine to Sit, Sit to Supine     Supine to sit: Max assist, +2 for physical assistance Sit to supine: Max assist, +2 for physical assistance        Transfers Overall transfer level: Needs assistance Equipment used: 2 person hand held assist Transfers: Sit to/from Stand Sit to Stand: Total assist, +2 physical assistance           General transfer comment: trace initiation for STS      Balance Overall balance assessment: Needs assistance Sitting-balance support: Feet supported, Bilateral upper extremity supported Sitting balance-Leahy Scale: Poor Sitting balance - Comments: variable assistance required,  close SBA-Mod A Postural control: Right lateral  lean     Standing balance comment: unable to stand this date                           ADL either performed or assessed with clinical judgement   ADL Overall ADL's : Needs assistance/impaired     Grooming: Wash/dry face;Maximal assistance;Sitting               Lower Body Dressing: Maximal assistance;Bed level                       Vision Patient Visual Report: No change from baseline Additional Comments: pt reports R eye is a little swollen from fall and laceration under eye, some drainage experienced     Perception     Praxis      Pertinent Vitals/Pain Pain Assessment Pain Assessment: 0-10 Pain Score: 9  Pain Location: 9/10 neck pain, chronic low back pain, R knee pain (tender to touch) Pain Descriptors / Indicators: Constant, Discomfort, Grimacing, Guarding, Sharp Pain Intervention(s): Limited activity within patient's tolerance, Monitored during session, Repositioned     Hand Dominance     Extremity/Trunk Assessment Upper Extremity Assessment Upper Extremity Assessment: RUE deficits/detail;LUE deficits/detail RUE Deficits / Details: fair grip strength, wrist extension/flexion 3/5, elbow flexion/extension 3/5, shoulder flexion 3/5 (at least 90 deg) LUE Deficits / Details: 0/5 grip strength, 2-/5 wrist extension, 0/5 wrist flexion, 3-/5 elbow flexion, 1/5 elbow extension (pain with elbow flexion), shoulder flexion 2-/5, shoulder abduction 3/5 (limited to <90 deg), compensating during testing with shoulder movement   Lower Extremity Assessment Lower Extremity Assessment: Defer to PT evaluation;Generalized weakness   Cervical / Trunk Assessment Cervical / Trunk Assessment: Neck Surgery   Communication Communication Communication: No difficulties   Cognition Arousal/Alertness: Awake/alert Behavior During Therapy: WFL for tasks assessed/performed Overall Cognitive Status: Within Functional Limits for tasks assessed                                  General Comments: very motivated despite pain     General Comments       Exercises Other Exercises Other Exercises: OT provided education re: role of OT, OT POC, post acute recs, sitting up for all meals, EOB/OOB mobility with assistance, home/fall safety.     Shoulder Instructions      Home Living Family/patient expects to be discharged to:: Private residence Living Arrangements: Children (daughter and 62 y/o granddaughter) Available Help at Discharge: Family;Available PRN/intermittently Type of Home: Mobile home Home Access: Ramped entrance     Home Layout: One level     Bathroom Shower/Tub: Teacher, early years/pre: Standard     Home Equipment: BSC/3in1;Cane - single point          Prior Functioning/Environment Prior Level of Function : Independent/Modified Independent;History of Falls (last six months)             Mobility Comments: Independent with no AD, just the 1 fall recently ADLs Comments: Independent        OT Problem List: Decreased strength;Decreased range of motion;Impaired UE functional use;Decreased activity tolerance;Impaired balance (sitting and/or standing);Pain;Decreased knowledge of use of DME or AE      OT Treatment/Interventions: Self-care/ADL training;Patient/family education;Therapeutic exercise;Balance training;Energy conservation;Therapeutic activities;DME and/or AE instruction;Splinting    OT Goals(Current goals can be found in the care plan section) Acute  Rehab OT Goals Patient Stated Goal: reduce pain and return to PLOF OT Goal Formulation: With patient Time For Goal Achievement: 10/18/22 Potential to Achieve Goals: Good   OT Frequency: Min 4X/week    Co-evaluation PT/OT/SLP Co-Evaluation/Treatment: Yes Reason for Co-Treatment: Complexity of the patient's impairments (multi-system involvement);For patient/therapist safety;To address functional/ADL transfers PT goals addressed during session:  Mobility/safety with mobility;Balance OT goals addressed during session: ADL's and self-care      AM-PAC OT "6 Clicks" Daily Activity     Outcome Measure Help from another person eating meals?: A Lot Help from another person taking care of personal grooming?: A Lot Help from another person toileting, which includes using toliet, bedpan, or urinal?: A Lot Help from another person bathing (including washing, rinsing, drying)?: A Lot Help from another person to put on and taking off regular upper body clothing?: A Lot Help from another person to put on and taking off regular lower body clothing?: A Lot 6 Click Score: 12   End of Session Equipment Utilized During Treatment: Gait belt;Cervical collar Nurse Communication: Mobility status  Activity Tolerance: Patient tolerated treatment well;Patient limited by pain Patient left: in bed;with call bell/phone within reach;with bed alarm set  OT Visit Diagnosis: Other abnormalities of gait and mobility (R26.89);History of falling (Z91.81);Muscle weakness (generalized) (M62.81);Pain Pain - part of body:  (low back, neck, R knee)                Time: 0086-7619 OT Time Calculation (min): 25 min Charges:  OT General Charges $OT Visit: 1 Visit OT Evaluation $OT Eval Moderate Complexity: 1 Mod  [facility] MS, OTR/L ascom [phone]  10/04/22, 1:16 PM

## 2022-10-05 ENCOUNTER — Encounter (INDEPENDENT_AMBULATORY_CARE_PROVIDER_SITE_OTHER): Payer: Self-pay

## 2022-10-05 DIAGNOSIS — S12101A Unspecified nondisplaced fracture of second cervical vertebra, initial encounter for closed fracture: Secondary | ICD-10-CM | POA: Diagnosis not present

## 2022-10-05 LAB — BASIC METABOLIC PANEL
Anion gap: 7 (ref 5–15)
BUN: 19 mg/dL (ref 8–23)
CO2: 22 mmol/L (ref 22–32)
Calcium: 7.6 mg/dL — ABNORMAL LOW (ref 8.9–10.3)
Chloride: 108 mmol/L (ref 98–111)
Creatinine, Ser: 1.28 mg/dL — ABNORMAL HIGH (ref 0.44–1.00)
GFR, Estimated: 47 mL/min — ABNORMAL LOW (ref >60)
Glucose, Bld: 151 mg/dL — ABNORMAL HIGH (ref 70–99)
Potassium: 4.3 mmol/L (ref 3.5–5.1)
Sodium: 137 mmol/L (ref 135–145)

## 2022-10-05 LAB — GLUCOSE, CAPILLARY
Glucose-Capillary: 141 mg/dL — ABNORMAL HIGH (ref 70–99)
Glucose-Capillary: 166 mg/dL — ABNORMAL HIGH (ref 70–99)
Glucose-Capillary: 171 mg/dL — ABNORMAL HIGH (ref 70–99)
Glucose-Capillary: 234 mg/dL — ABNORMAL HIGH (ref 70–99)
Glucose-Capillary: 301 mg/dL — ABNORMAL HIGH (ref 70–99)

## 2022-10-05 LAB — CBC
HCT: 27.7 % — ABNORMAL LOW (ref 36.0–46.0)
Hemoglobin: 8.7 g/dL — ABNORMAL LOW (ref 12.0–15.0)
MCH: 30.4 pg (ref 26.0–34.0)
MCHC: 31.4 g/dL (ref 30.0–36.0)
MCV: 96.9 fL (ref 80.0–100.0)
Platelets: 229 10*3/uL (ref 150–400)
RBC: 2.86 MIL/uL — ABNORMAL LOW (ref 3.87–5.11)
RDW: 20.2 % — ABNORMAL HIGH (ref 11.5–15.5)
WBC: 14.8 10*3/uL — ABNORMAL HIGH (ref 4.0–10.5)
nRBC: 0 % (ref 0.0–0.2)

## 2022-10-05 MED ORDER — ORAL CARE MOUTH RINSE: 15.0000 mL | OROMUCOSAL | Status: DC | PRN

## 2022-10-05 NOTE — NC FL2 (Signed)
[place] LEVEL OF CARE SCREENING TOOL     IDENTIFICATION  Patient Name: Janice Short Birthdate: [birth year] Sex: female Admission Date (Current Location): 10/01/2022  [facility] and [place] Number:  Engineering geologist and Address:  [facility], [address], [place], [place] [address]      Provider Number: [id number]  Attending Physician Name and Address:  Shawna Clamp, MD  Relative Name and Phone Number:       Current Level of Care: Hospital Recommended Level of Care: [place] Prior Approval Number:    Date Approved/Denied:   PASRR Number: [id number]  Discharge Plan: SNF    Current Diagnoses: Patient Active Problem List   Diagnosis Date Noted   Central cord syndrome ([place]) 10/02/2022   Cervical spine instability 10/02/2022   Accidental fall 10/01/2022   Closed fracture of C2 vertebra ([place]) 10/01/2022   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   Hyperkalemia 10/01/2022   Stage 3a chronic kidney disease ([place]) 10/01/2022   Left hip pain    Laceration of cheek without complication, left, initial encounter    Moderate protein-calorie malnutrition ([place]) 09/25/2022   Simple chronic bronchitis ([place]) 09/25/2022   Tobacco use 09/25/2022   Rectal polyp    Polyp of sigmoid colon    Polyp of duodenum    Intrahepatic cholangiocarcinoma ([place]) 04/12/2021   Goals of care, counseling/discussion 04/12/2021   Mild episode of recurrent major depressive disorder ([place]) 02/08/2019   Uncontrolled type 2 diabetes mellitus with hyperglycemia, with long-term current use of insulin ([place]) 12/17/2017   Spinal stenosis in cervical region 10/15/2016   Diabetic polyneuropathy associated with type 2 diabetes mellitus ([place]) 07/14/2016   Chronic midline low back pain with sciatica 06/22/2016   Primary osteoarthritis of both knees 06/22/2016   Carpal tunnel syndrome 07/01/2015   Cervical radiculitis 07/01/2015   Corn or callus  07/01/2015   Impingement syndrome of shoulder 07/01/2015   Microalbuminuria 07/01/2015   Tenosynovitis of wrist 07/01/2015   Hypertension 11/05/2009   Athlete's foot 08/05/2009   Vitamin D deficiency 01/08/2009   Allergic rhinitis 12/28/2008   Lumbosacral neuritis 08/11/2007    Orientation RESPIRATION BLADDER Height & Weight     Self, Time, Situation, Place  Normal Incontinent, External catheter Weight: 176 lb 5.9 oz (80 kg) Height:  5\' 7"  (170.2 cm)  BEHAVIORAL SYMPTOMS/MOOD NEUROLOGICAL BOWEL NUTRITION STATUS   (None)  (None) Continent Diet (Soft/Carb modified)  AMBULATORY STATUS COMMUNICATION OF NEEDS Skin   Extensive Assist Verbally Skin abrasions, Bruising, Surgical wounds, Other (Comment) (Incision on neck: Dermabond. Laceration on right upper face: No dressing.)                       Personal Care Assistance Level of Assistance  Bathing, Feeding, Dressing Bathing Assistance: Maximum assistance Feeding assistance: Limited assistance Dressing Assistance: Maximum assistance     Functional Limitations Info  Sight, Hearing, Speech Sight Info: Adequate Hearing Info: Adequate Speech Info: Adequate    SPECIAL CARE FACTORS FREQUENCY  PT (By licensed PT), OT (By licensed OT)     PT Frequency: 5 x week OT Frequency: 5 x week            Contractures Contractures Info: Not present    Additional Factors Info  Code Status, Allergies Code Status Info: Full code Allergies Info: Cat dander and grass pollen, Cisplatin, Bydureon (exenatide).           Current Medications (10/05/2022):  This is  the current hospital active medication list Current Facility-Administered Medications  Medication Dose Route Frequency Provider Last Rate Last Admin   acetaminophen (TYLENOL) tablet 650 mg  650 mg Oral Q6H PRN Meade Maw, MD   650 mg at 10/05/22 0335   Or   acetaminophen (TYLENOL) suppository 650 mg  650 mg Rectal Q6H PRN Meade Maw, MD       albuterol  (PROVENTIL) (2.5 MG/3ML) 0.083% nebulizer solution 2.5 mg  2.5 mg Nebulization Q2H PRN Meade Maw, MD       amLODipine (NORVASC) tablet 2.5 mg  2.5 mg Oral Daily Shawna Clamp, MD   2.5 mg at 10/05/22 0947   atorvastatin (LIPITOR) tablet 40 mg  40 mg Oral Daily Meade Maw, MD   40 mg at 10/05/22 0948   escitalopram (LEXAPRO) tablet 20 mg  20 mg Oral Daily Meade Maw, MD   20 mg at 10/05/22 0947   fluticasone furoate-vilanterol (BREO ELLIPTA) 100-25 MCG/ACT 1 puff  1 puff Inhalation Daily Meade Maw, MD   1 puff at 10/05/22 1027   And   umeclidinium bromide (INCRUSE ELLIPTA) 62.5 MCG/ACT 1 puff  1 puff Inhalation Daily Meade Maw, MD   1 puff at 10/05/22 0952   gabapentin (NEURONTIN) capsule 300 mg  300 mg Oral BID Meade Maw, MD   300 mg at 10/05/22 0948   insulin aspart (novoLOG) injection 0-15 Units  0-15 Units Subcutaneous TID AC & HS Shawna Clamp, MD   2 Units at 10/05/22 0834   insulin glargine-yfgn [facility]) injection 8 Units  8 Units Subcutaneous Q2200 Meade Maw, MD   8 Units at 10/04/22 2145   methocarbamol (ROBAXIN) tablet 500 mg  500 mg Oral Q6H PRN Meade Maw, MD   500 mg at 10/05/22 0831   morphine (PF) 2 MG/ML injection 2 mg  2 mg Intravenous Q2H PRN Meade Maw, MD   2 mg at 10/05/22 0947   ondansetron (ZOFRAN) tablet 4 mg  4 mg Oral Q6H PRN Meade Maw, MD       Or   ondansetron [facility]) injection 4 mg  4 mg Intravenous Q6H PRN Meade Maw, MD   4 mg at 10/02/22 0119   Oral care mouth rinse  15 mL Mouth Rinse PRN Shawna Clamp, MD       oxyCODONE (Oxy IR/ROXICODONE) immediate release tablet 5 mg  5 mg Oral Q3H PRN Meade Maw, MD   5 mg at 10/05/22 2536   patiromer Daryll Drown) packet 8.4 g  8.4 g Oral Daily Meade Maw, MD   8.4 g at 10/05/22 6440   Facility-Administered Medications Ordered in Other Encounters  Medication Dose Route Frequency Provider Last Rate Last Admin    heparin lock flush 100 UNIT/ML injection              Discharge Medications: Please see discharge summary for a list of discharge medications.  Relevant Imaging Results:  Relevant Lab Results:   Additional Information SS#: [id number]. Gets chemo every other week.  Candie Chroman, LCSW

## 2022-10-05 NOTE — Progress Notes (Signed)
  Inpatient Rehabilitation Admissions Coordinator   Met with patient and her sister at bedside for rehab assessment. I also contacted her daughter by phone to discuss the same.We discussed goals and expectations of a possible CIR admit. They would like to discuss further before making final dispo decision. . Family can provide expected caregiver support that is recommended . Daughter works remotely from home but also has a 63 year old who is autistic in the home to provide for.  I will begin insurance Auth with [place] for possible CIR admit pending approval. Please call me with any questions.   Danne Baxter, RN, MSN Rehab Admissions Coordinator [phone]

## 2022-10-05 NOTE — Progress Notes (Signed)
Wallet sent with security on 10/30.  Key in patients folder

## 2022-10-05 NOTE — TOC Initial Note (Signed)
Transition of Care [facility]) - Initial/Assessment Note    Patient Details  Name: Janice Short MRN: [id number] Date of Birth: Mar 14, 1961  [facility]) CM/SW Contact:    Candie Chroman, LCSW Phone Number: 10/05/2022, 11:28 AM  Clinical Narrative:  CSW met with patient. No supports at bedside. CSW introduced role and explained that therapy recommendations would be discussed. Patient became very tearful when discuss CIR vs SNF because she does not want to be away from her grandson. Discussed concerns with weakness and ability for family to provide level of care she needs. She gave CSW permission to send out SNF referral to see what her options are in case she ends up pursuing that. Tried calling daughter but phone number is incorrect. Tried calling son but phone says he is unavailable. Left voicemail for friend Jenny Reichmann. SDOH flag for food insecurity. Patient does not get food stamps but gets a food card each month from her insurance company. Provided [employer] in case she needs local food pantry. No further concerns. CSW encouraged patient to contact CSW as needed. CSW will continue to follow patient for support and facilitate discharge once medically stable.                Expected Discharge Plan:  (TBD) Barriers to Discharge: Continued Medical Work up   Patient Goals and CMS Choice   CMS Medicare.gov Compare Post Acute Care list provided to:: Patient    Expected Discharge Plan and Services Expected Discharge Plan:  (TBD)     Post Acute Care Choice:  (TBD) Living arrangements for the past 2 months: Single Family Home                                      Prior Living Arrangements/Services Living arrangements for the past 2 months: Single Family Home Lives with:: Adult Children, Other (Comment) Biomedical scientist) Patient language and need for interpreter reviewed:: Yes Do you feel safe going back to the place where you live?: Yes      Need for Family  Participation in Patient Care: Yes (Comment) Care giver support system in place?: Yes (comment)   Criminal Activity/Legal Involvement Pertinent to Current Situation/Hospitalization: No - Comment as needed  Activities of Daily Living Home Assistive Devices/Equipment: None ADL Screening (condition at time of admission) Patient's cognitive ability adequate to safely complete daily activities?: Yes Is the patient deaf or have difficulty hearing?: No Does the patient have difficulty seeing, even when wearing glasses/contacts?: No Does the patient have difficulty concentrating, remembering, or making decisions?: No Patient able to express need for assistance with ADLs?: No Does the patient have difficulty dressing or bathing?: No Independently performs ADLs?: Yes (appropriate for developmental age) Does the patient have difficulty walking or climbing stairs?: No Weakness of Legs: None Weakness of Arms/Hands: None  Permission Sought/Granted Permission sought to share information with : Facility Sport and exercise psychologist, Family Supports Permission granted to share information with : Yes, Verbal Permission Granted  Share Information with NAME: [name]  Permission granted to share info w AGENCY: SNF's  Permission granted to share info w Relationship: Daughter     Emotional Assessment Appearance:: Appears stated age Attitude/Demeanor/Rapport: Crying Affect (typically observed): Anxious, Overwhelmed Orientation: : Oriented to Self, Oriented to Place, Oriented to  Time, Oriented to Situation Alcohol / Substance Use: Not Applicable Psych Involvement: No (comment)  Admission diagnosis:  Contusion of cervical cord (  [place]) [H73.428J] Facial laceration, initial encounter [S01.81XA] Injury of head, initial encounter [G81.15BW] Fall, initial encounter [W19.XXXA] Other closed nondisplaced fracture of second cervical vertebra, initial encounter [facility]) [S12.191A] Patient Active Problem List    Diagnosis Date Noted   Central cord syndrome ([place]) 10/02/2022   Cervical spine instability 10/02/2022   Accidental fall 10/01/2022   Closed fracture of C2 vertebra ([place]) 10/01/2022   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/01/2022   Hyperkalemia 10/01/2022   Stage 3a chronic kidney disease ([place]) 10/01/2022   Left hip pain    Laceration of cheek without complication, left, initial encounter    Moderate protein-calorie malnutrition ([place]) 09/25/2022   Simple chronic bronchitis ([place]) 09/25/2022   Tobacco use 09/25/2022   Rectal polyp    Polyp of sigmoid colon    Polyp of duodenum    Intrahepatic cholangiocarcinoma ([place]) 04/12/2021   Goals of care, counseling/discussion 04/12/2021   Mild episode of recurrent major depressive disorder ([place]) 02/08/2019   Uncontrolled type 2 diabetes mellitus with hyperglycemia, with long-term current use of insulin ([place]) 12/17/2017   Spinal stenosis in cervical region 10/15/2016   Diabetic polyneuropathy associated with type 2 diabetes mellitus ([place]) 07/14/2016   Chronic midline low back pain with sciatica 06/22/2016   Primary osteoarthritis of both knees 06/22/2016   Carpal tunnel syndrome 07/01/2015   Cervical radiculitis 07/01/2015   Corn or callus 07/01/2015   Impingement syndrome of shoulder 07/01/2015   Microalbuminuria 07/01/2015   Tenosynovitis of wrist 07/01/2015   Hypertension 11/05/2009   Athlete's foot 08/05/2009   Vitamin D deficiency 01/08/2009   Allergic rhinitis 12/28/2008   Lumbosacral neuritis 08/11/2007   PCP:  Steele Sizer, MD Pharmacy:   [facility] [place], [place] - [address] [place] [address] Phone: [phone] Fax: [phone]  [facility] [address], [place] - [place] [address] Phone: [phone] Fax: [phone]     Social Determinants of Health (SDOH) Interventions    Readmission Risk Interventions     No data to  display

## 2022-10-05 NOTE — Progress Notes (Signed)
PROGRESS NOTE    Janice Short  XBJ:[id number] DOB: 06/10/1961 DOA: 10/01/2022 PCP: Steele Sizer, MD   Brief Narrative:  This 61 years old female with PMH significant for IDDM, chronic bronchitis, intrahepatic cholangiocarcinoma on chemotherapy, last infusion on 09/18/2022 presented in the ED s/p accidental fall.  She was unable to stand up.  She sustained a laceration on the left cheek and complained of left hip pain and bilateral upper extremity numbness since the fall.  Imaging in the ED shows nondisplaced fracture of anterior inferior body of C2.  No epidural hematoma.  Multilevel degenerative changes most prominent at C4 and C5. Patient was placed on cervical collar. ED physician has spoken with neurosurgeon Dr. Cari Caraway who recommended ACDF for acute spinal contusion at C4-C5 due to severe spinal stenosis.  Patient is admitted for further management.  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 She suffered central cord injury causing central cord syndrome status post fall. Patient was evaluated by Dr. Cari Caraway who recommended ACDF. Continue cervical collar. Patient is s/p ACDF POD # 4 Continue adequate pain control. She has slight improvement in pain level/ strength Reassessed by neurosurgery recommended  pain control and PT and OT eval. Neurosurgery signed off , recommended acute rehab.   Left hip pain: S/p accidental fall. X-ray hip shows no acute fracture. Continue adequate pain control.  Cheek lacerations without complication: Lacerations sutured in ED. Continue wound care.  CKD stage IIIa: Serum creatinine at baseline. Avoid nephrotoxic medications.   Hyperkalemia: > Resolved. Potassium 5.7. Lokelma x1 given. Potassium normalized 4.5  Chronic bronchitis: Continue home inhalers. DuoNebs as needed. Continue Trelegy Ellipta.   Type 2 diabetes with hyperglycemia: Sliding scale insulin coverage Continue Semglee 8 units at bedtime. Carb modified diet   Essential HTN: Continue amlodipine 2.5 mg daily. Blood pressure has improved.   DVT prophylaxis: SCDs Code Status: Full code Family Communication: No family at bedside. Disposition Plan:  Status is: Inpatient Remains inpatient appropriate because:    Admitted s/p accidental fall with central cord injury causing central cord syndrome.  Neurosurgery consulted patient underwent s/p ACDF POD 4, PT and OT recommended acute rehab.  Consultants:  Neurosurgery  Procedures: S/p ACDF Antimicrobials: None  Subjective: Patient was seen and examined at the bedside. Overnight events noted. S/p ACDF POD # 4, lying comfortably in bed, states she still has pain in the neck. She reports improvement in the right arm weakness.  Objective: Vitals:   10/05/22 0027 10/05/22 0329 10/05/22 0745 10/05/22 1116  BP: (!) 115/53 110/66 (!) 113/52 136/67  Pulse: (!) 107 (!) 101 86 (!) 104  Resp: 18 18 18 20   Temp: 98.8 F (37.1 C) 100.3 F (37.9 C) 98.4 F (36.9 C) 99.2 F (37.3 C)  TempSrc:      SpO2: 100% 98% 100% 100%  Weight:  Height:        Intake/Output Summary (Last 24 hours) at 10/05/2022 1231 Last data filed at 10/05/2022 1035 Gross per 24 hour  Intake 120 ml  Output 1000 ml  Net -880 ml   Filed Weights    10/01/22 1629 10/02/22 0716  Weight: 80 kg 80 kg    Examination:  General exam: Appears comfortable, not in any acute distress, deconditioned. Lacerations noted on the neck and the left cheek, stitches noted. Respiratory system: CTA bilaterally, respiratory effort normal, RR 15. Cardiovascular system: S1 & S2 heard, regular rate and rhythm, no murmur. Gastrointestinal system: Abdomen is soft, non tender, non distended, BS+ Central nervous system: Alert and oriented x 3. No focal neurological deficits. Back: Tenderness noted in the upper back, neck Extremities: No edema, no cyanosis, no clubbing. Skin: No rashes, lesions or ulcers Psychiatry: Judgement and insight appear normal. Mood & affect appropriate.     Data Reviewed: I have personally reviewed following labs and imaging studies  CBC: Recent Labs  Lab 10/01/22 1631 10/03/22 0359 10/04/22 0611 10/05/22 0643  WBC 12.2* 21.6* 12.6* 14.8*  NEUTROABS 9.0*  --   --   --   HGB 9.7* 9.5* 9.5* 8.7*  HCT 31.0* 29.9* 30.0* 27.7*  MCV 99.0 98.0 96.5 96.9  PLT 97* 152 200 563   Basic Metabolic Panel: Recent Labs  Lab 10/01/22 1755 10/02/22 0553 10/03/22 0359 10/04/22 0611 10/05/22 0643  NA 136 140 138 136 137  K 5.7* 4.5 4.5 4.3 4.3  CL 110 114* 113* 108 108  CO2 21* 21* 22 22 22   GLUCOSE 243* 131* 131* 125* 151*  BUN 13 13 18 20 19   CREATININE 1.30* 1.25* 1.11* 1.15* 1.28*  CALCIUM 7.9* 8.0* 8.5* 7.8* 7.6*  MG  --   --  1.9  --   --   PHOS  --   --  2.6  --   --    GFR: Estimated Creatinine Clearance: 49 mL/min (A) (by C-G formula based on SCr of 1.28 mg/dL (H)). Liver Function Tests: No results for input(s): "AST", "ALT", "ALKPHOS", "BILITOT", "PROT", "ALBUMIN" in the last 168 hours. No results for input(s): "LIPASE", "AMYLASE" in the last 168 hours. No results for input(s): "AMMONIA" in the last 168 hours. Coagulation Profile: Recent Labs  Lab 10/01/22 2106  INR 1.2   Cardiac Enzymes: No results for  input(s): "CKTOTAL", "CKMB", "CKMBINDEX", "TROPONINI" in the last 168 hours. BNP (last 3 results) No results for input(s): "PROBNP" in the last 8760 hours. HbA1C: No results for input(s): "HGBA1C" in the last 72 hours. CBG: Recent Labs  Lab 10/04/22 1559 10/04/22 2123 10/05/22 0030 10/05/22 0742 10/05/22 1112  GLUCAP 123* 232* 171* 141* 166*   Lipid Profile: No results for input(s): "CHOL", "HDL", "LDLCALC", "TRIG", "CHOLHDL", "LDLDIRECT" in the last 72 hours. Thyroid Function Tests: No results for input(s): "TSH", "T4TOTAL", "FREET4", "T3FREE", "THYROIDAB" in the last 72 hours. Anemia Panel: No results for input(s): "VITAMINB12", "FOLATE", "FERRITIN", "TIBC", "IRON", "RETICCTPCT" in the last 72 hours. Sepsis Labs: No results for input(s): "PROCALCITON", "LATICACIDVEN" in the last 168 hours.  Recent Results (from the past 240 hour(s))  SARS Coronavirus 2 by RT PCR (hospital order, performed in [facility] hospital lab) *cepheid single result test* Anterior Nasal Swab     Status: None   Collection Time: 10/01/22  4:31 PM   Specimen: Anterior Nasal Swab  Result Value Ref Range Status   SARS Coronavirus 2 by RT PCR NEGATIVE NEGATIVE Final  Comment: (NOTE) SARS-CoV-2 target nucleic acids are NOT DETECTED.  The SARS-CoV-2 RNA is generally detectable in upper and lower respiratory specimens during the acute phase of infection. The lowest concentration of SARS-CoV-2 viral copies this assay can detect is 250 copies / mL. A negative result does not preclude SARS-CoV-2 infection and should not be used as the sole basis for treatment or other patient management decisions.  A negative result may occur with improper specimen collection / handling, submission of specimen other than nasopharyngeal swab, presence of viral mutation(s) within the areas targeted by this assay, and inadequate number of viral copies (<250 copies / mL). A negative result must be combined with  clinical observations, patient history, and epidemiological information.  Fact Sheet for Patients:   https://www.patel.info/  Fact Sheet for Healthcare Providers: https://hall.com/  This test is not yet approved or  cleared by the [place] FDA and has been authorized for detection and/or diagnosis of SARS-CoV-2 by FDA under an Emergency Use Authorization (EUA).  This EUA will remain in effect (meaning this test can be used) for the duration of the COVID-19 declaration under Section 564(b)(1) of the Act, 21 U.S.C. section 360bbb-3(b)(1), unless the authorization is terminated or revoked sooner.  Performed at [facility], [address], [place], [place] [address]     Radiology Studies: No results found.  Scheduled Meds:  amLODipine  2.5 mg Oral Daily   atorvastatin  40 mg Oral Daily   escitalopram  20 mg Oral Daily   fluticasone furoate-vilanterol  1 puff Inhalation Daily   And   umeclidinium bromide  1 puff Inhalation Daily   gabapentin  300 mg Oral BID   insulin aspart  0-15 Units Subcutaneous TID AC & HS   insulin glargine-yfgn  8 Units Subcutaneous Q2200   patiromer  8.4 g Oral Daily   Continuous Infusions:   LOS: 4 days   Time spent: 35 mins  Trana Ressler, MD [facility]   If 7PM-7AM, please contact night-coverage

## 2022-10-05 NOTE — Progress Notes (Signed)
Occupational Therapy Treatment Patient Details Name: Janice Short MRN: [id number] DOB: 1961-11-15 Today's Date: 10/05/2022   History of present illness Pt is a 61 y.o. female presenting to ED 10/26 s/p fall (head was hit on bed rail during fall).  Large laceration to face and shortened and externally rotate hip noted by EMS.  Imaging showing nondisplaced teardrop type fracture involving the anterior inferior aspect of the C2 vertebral body.  Pt admitted with closed fx of C2 vertebra; contusion of cervical cord C4-5 d/t accidental fall, L hip pain (x-ray hip showing no fx), laceration L cheek, hyperkalemia, and simple chronic bronchitis.  S/p ACDF C4-5 10/02/22.  PMH includes spinal stenosis, DM, metastatic intrahepatic cholangiocarcinoma on chemotherapy, htn, HLD, CTS.   OT comments  Pt agreeable to OT/PT co-treatment to maximize safety and participation. Tx session targeted improving functional balance during ADL tasks in preparation for improved functional transfers and ADL task completion. RN present at beginning of session to give pain meds. Pt required +2 assist this date for bed mobility and standing attempts from EOB. She required Max A for LB dressing and set up A for self-feeding. Pt appears to be limited by pain and fear of falling with OOB mobility. Pt required increased time for all tasks this date 2/2 LLE and low back pain. Pt is making progress toward goal completion. D/C recommendation remains appropriate. OT will continue to follow acutely.    Recommendations for follow up therapy are one component of a multi-disciplinary discharge planning process, led by the attending physician.  Recommendations may be updated based on patient status, additional functional criteria and insurance authorization.    Follow Up Recommendations  Acute inpatient rehab (3hours/day)    Assistance Recommended at Discharge Frequent or constant Supervision/Assistance  Patient can return home with the  following  Two people to help with walking and/or transfers;Assistance with cooking/housework;Assist for transportation;Help with stairs or ramp for entrance;Assistance with feeding;Two people to help with bathing/dressing/bathroom;Direct supervision/assist for medications management   Equipment Recommendations  Other (comment) (defer to next venue of care)    Recommendations for Other Services      Precautions / Restrictions Precautions Precautions: Cervical;Fall Precaution Booklet Issued: No Precaution Comments: Only needs brace when up and out of bed; may remove brace when in bed; apply/remove brace while sitting; may remove brace to shower; may ambulate to bathroom without brace; MAP >80 Required Braces or Orthoses: Cervical Brace Cervical Brace: Hard collar ([place] collar) Restrictions Weight Bearing Restrictions: No       Mobility Bed Mobility Overal bed mobility: Needs Assistance Bed Mobility: Supine to Sit, Sit to Supine, Rolling Rolling: Max assist   Supine to sit: Max assist, +2 for physical assistance Sit to supine: Max assist, +2 for physical assistance   General bed mobility comments: assist for trunk and B LE's; assist for logrolling technique    Transfers Overall transfer level: Needs assistance Equipment used: None Transfers: Sit to/from Stand Sit to Stand: Total assist, +2 physical assistance           General transfer comment: Trace initiation for 1st attempt at STS from EOB. Improved initiation on 2nd trial, however, still unable to clear bottom from surface of bed.     Balance Overall balance assessment: Needs assistance Sitting-balance support: Feet supported, Bilateral upper extremity supported Sitting balance-Leahy Scale: Poor Sitting balance - Comments: close SBA-Min A; pt intermittently adjusting herself in bed leaning to R or leaning forward Postural control: Right lateral lean  Standing balance comment: unable to stand at this time                            ADL either performed or assessed with clinical judgement   ADL Overall ADL's : Needs assistance/impaired Eating/Feeding: Set up;Bed level Eating/Feeding Details (indicate cue type and reason): Pt completing self-feeding with R hand, some difficulty accessing items on tray, attempted to set up pt for optimal positioning Grooming: Moderate assistance;Wash/dry face;Sitting           Upper Body Dressing : Maximal assistance;Bed level Upper Body Dressing Details (indicate cue type and reason): to don/doff C collar Lower Body Dressing: Maximal assistance;Sitting/lateral leans Lower Body Dressing Details (indicate cue type and reason): to adjust socks                    Extremity/Trunk Assessment Upper Extremity Assessment Upper Extremity Assessment: RUE deficits/detail;LUE deficits/detail RUE Deficits / Details: fair grip strength, wrist extension/flexion 3/5, elbow flexion/extension at least 3/5, shoulder flexion and abduction at least 3/5 (at least 90 degrees) LUE Deficits / Details: 0/5 grip strength, 2-/5 wrist extension, 0/5 wrist flexion, 3-/5 elbow flexion (with pain in L elbow), 1/5 elbow extension, shoulder flexion 2-/5, shoulder abduction 3/5 (limited to <90 deg), compensating during testing with shoulder movement   Lower Extremity Assessment Lower Extremity Assessment: Generalized weakness   Cervical / Trunk Assessment Cervical / Trunk Assessment: Neck Surgery    Vision Patient Visual Report: No change from baseline     Perception     Praxis      Cognition Arousal/Alertness: Awake/alert Behavior During Therapy: WFL for tasks assessed/performed Overall Cognitive Status: Within Functional Limits for tasks assessed                                          Exercises Other Exercises Other Exercises: OT providing education re: AROM/PROM exercises for L hand to improve strength and ROM    Shoulder Instructions        General Comments BP in supine: 113/62 (MAP 79). BP after activity: 102/59 (MAP 74).    Pertinent Vitals/ Pain       Pain Assessment Pain Assessment: 0-10 Pain Score: 9  Pain Location: 9/10 neck pain, chronic low back pain, LLE Pain Descriptors / Indicators: Constant, Discomfort, Grimacing, Guarding, Sharp Pain Intervention(s): Limited activity within patient's tolerance, RN gave pain meds during session, Monitored during session, Repositioned  Home Living                                          Prior Functioning/Environment              Frequency  Min 4X/week        Progress Toward Goals  OT Goals(current goals can now be found in the care plan section)  Progress towards OT goals: Progressing toward goals  Acute Rehab OT Goals Patient Stated Goal: reduce pain and return to PLOF OT Goal Formulation: With patient Time For Goal Achievement: 10/18/22 Potential to Achieve Goals: Good  Plan Discharge plan remains appropriate;Frequency remains appropriate    Co-evaluation    PT/OT/SLP Co-Evaluation/Treatment: Yes Reason for Co-Treatment: Complexity of the patient's impairments (multi-system involvement);For patient/therapist safety;To address functional/ADL transfers PT goals addressed  during session: Mobility/safety with mobility;Balance OT goals addressed during session: ADL's and self-care      AM-PAC OT "6 Clicks" Daily Activity     Outcome Measure   Help from another person eating meals?: A Lot Help from another person taking care of personal grooming?: A Lot Help from another person toileting, which includes using toliet, bedpan, or urinal?: A Lot Help from another person bathing (including washing, rinsing, drying)?: A Lot Help from another person to put on and taking off regular upper body clothing?: A Lot Help from another person to put on and taking off regular lower body clothing?: A Lot 6 Click Score: 12    End of Session  Equipment Utilized During Treatment: Gait belt;Cervical collar  OT Visit Diagnosis: Other abnormalities of gait and mobility (R26.89);History of falling (Z91.81);Muscle weakness (generalized) (M62.81);Pain Pain - part of body:  (low back, neck, LLE)   Activity Tolerance Patient tolerated treatment well;Patient limited by pain   Patient Left in bed;with call bell/phone within reach;with bed alarm set   Nurse Communication Mobility status        Time: 7741-4239 OT Time Calculation (min): 47 min  Charges: OT General Charges $OT Visit: 1 Visit OT Treatments $Self Care/Home Management : 8-22 mins  [facility] MS, OTR/L ascom [phone]  10/05/22, 1:29 PM

## 2022-10-05 NOTE — Progress Notes (Signed)
Physical Therapy Treatment Patient Details Name: Janice Short MRN: [id number] DOB: 1961-01-12 Today's Date: 10/05/2022   History of Present Illness Pt is a 61 y.o. female presenting to ED 10/26 s/p fall (head was hit on bed rail during fall).  Large laceration to face and shortened and externally rotate hip noted by EMS.  Imaging showing nondisplaced teardrop type fracture involving the anterior inferior aspect of the C2 vertebral body.  Pt admitted with closed fx of C2 vertebra; contusion of cervical cord C4-5 d/t accidental fall, L hip pain (x-ray hip showing no fx), laceration L cheek, hyperkalemia, and simple chronic bronchitis.  S/p ACDF C4-5 10/02/22.  PMH includes spinal stenosis, DM, metastatic intrahepatic cholangiocarcinoma on chemotherapy, htn, HLD, CTS.    PT Comments    Pt resting in bed upon therapy arrival; PT/OT co-session performed.  Pt reporting 9/10 posterior neck pain during session (pt also reporting low back and L LE pain during session as well; only mild R knee pain noted today)--pt received oral pain meds prior to therapy and IV pain meds beginning of therapy session.  Max assist x2 for bed mobility; close SBA to min assist for sitting balance; and unable to clear bottom from bed (attempting to stand) with 2 assist (x2 trials).  Pt requiring increased time for activity d/t pain but pt appearing very motivated to participate.  Will continue to focus on strengthening, balance, and progressive functional mobility during hospitalization.    Recommendations for follow up therapy are one component of a multi-disciplinary discharge planning process, led by the attending physician.  Recommendations may be updated based on patient status, additional functional criteria and insurance authorization.  Follow Up Recommendations  Acute inpatient rehab (3hours/day)     Assistance Recommended at Discharge Frequent or constant Supervision/Assistance  Patient can return home with the  following Two people to help with walking and/or transfers;Two people to help with bathing/dressing/bathroom;Assistance with cooking/housework;Assistance with feeding;Direct supervision/assist for medications management;Assist for transportation;Help with stairs or ramp for entrance   Equipment Recommendations  Other (comment) (TBD at next facility)    Recommendations for Other Services Rehab consult     Precautions / Restrictions Precautions Precautions: Cervical;Fall Precaution Booklet Issued: No Precaution Comments: Only needs brace when up and out of bed; may remove brace when in bed; apply/remove brace while sitting; may remove brace to shower; may ambulate to bathroom without brace; MAP >80 Required Braces or Orthoses: Cervical Brace Cervical Brace: Hard collar ([place] collar) Restrictions Weight Bearing Restrictions: No     Mobility  Bed Mobility Overal bed mobility: Needs Assistance Bed Mobility: Supine to Sit, Sit to Supine, Rolling Rolling: Max assist   Supine to sit: Max assist, +2 for physical assistance Sit to supine: Max assist, +2 for physical assistance   General bed mobility comments: assist for trunk and B LE's; assist for logrolling technique    Transfers Overall transfer level: Needs assistance Equipment used: None Transfers: Sit to/from Stand Sit to Stand: Total assist, +2 physical assistance           General transfer comment: x2 trials of sit to stand; trace initiation 1st trial but improved initiation noted on 2nd trial (pt unable to clear bottom from bed surface either trails)    Ambulation/Gait               General Gait Details: not appropriate at this time   Chief Strategy Officer  Modified Rankin (Stroke Patients Only)       Balance Overall balance assessment: Needs assistance Sitting-balance support: Feet supported, Bilateral upper extremity supported Sitting balance-Leahy Scale: Poor Sitting  balance - Comments: close SBA to min assist (pt intermittently adjusting herself in bed and leaning R or forward) Postural control: Right lateral lean     Standing balance comment: unable to stand at this time                            Cognition Arousal/Alertness: Awake/alert Behavior During Therapy: [facility] for tasks assessed/performed Overall Cognitive Status: Within Functional Limits for tasks assessed                                 General Comments: Pt very motivated despite pain.        Exercises      General Comments General comments (skin integrity, edema, etc.): BP supine in bed at rest: 113/62 (MAP 79).  BP post activity: 102/59 (MAP 74).  Nurse notified of MAP.      Pertinent Vitals/Pain Pain Assessment Pain Assessment: 0-10 Pain Score: 9  Pain Location: 9/10 neck pain; chronic low back pain; LLE pain Pain Descriptors / Indicators: Constant, Discomfort, Grimacing, Guarding, Sharp Pain Intervention(s): Limited activity within patient's tolerance, Monitored during session, Premedicated before session, Repositioned, RN gave pain meds during session Vitals (HR and O2 on room air) stable and WFL throughout treatment session.    Home Living                          Prior Function            PT Goals (current goals can now be found in the care plan section) Acute Rehab PT Goals Patient Stated Goal: to improve mobility PT Goal Formulation: With patient Time For Goal Achievement: 10/18/22 Potential to Achieve Goals: Good Progress towards PT goals: Progressing toward goals    Frequency    7X/week      PT Plan Current plan remains appropriate    Co-evaluation PT/OT/SLP Co-Evaluation/Treatment: Yes Reason for Co-Treatment: Complexity of the patient's impairments (multi-system involvement);For patient/therapist safety;To address functional/ADL transfers PT goals addressed during session: Mobility/safety with  mobility;Balance OT goals addressed during session: ADL's and self-care      AM-PAC PT "6 Clicks" Mobility   Outcome Measure  Help needed turning from your back to your side while in a flat bed without using bedrails?: A Lot Help needed moving from lying on your back to sitting on the side of a flat bed without using bedrails?: Total Help needed moving to and from a bed to a chair (including a wheelchair)?: Total Help needed standing up from a chair using your arms (e.g., wheelchair or bedside chair)?: Total Help needed to walk in hospital room?: Total Help needed climbing 3-5 steps with a railing? : Total 6 Click Score: 7    End of Session Equipment Utilized During Treatment: Gait belt;Cervical collar Activity Tolerance: Patient limited by pain Patient left: in bed;with call bell/phone within reach;with bed alarm set;Other (comment) (B heels floating via pillows) Nurse Communication: Mobility status;Precautions;Other (comment) (pt's pain status and pt's BP MAP) PT Visit Diagnosis: Other abnormalities of gait and mobility (R26.89);Muscle weakness (generalized) (M62.81);History of falling (Z91.81);Pain Pain - Right/Left: Right Pain - part of body: Knee     Time: 6387-5643 PT Time  Calculation (min) (ACUTE ONLY): 47 min  Charges:  $Therapeutic Activity: 23-37 mins                    Leitha Bleak, PT 10/05/22, 3:33 PM

## 2022-10-05 NOTE — Progress Notes (Signed)
Inpatient Rehabilitation Admissions Coordinator   Rehab consult received. I will do onsite rehab assessment this afternoon.  Danne Baxter, RN, MSN Rehab Admissions Coordinator [phone] 10/05/2022 10:30 AM

## 2022-10-05 NOTE — Care Management Important Message (Signed)
Important Message  Patient Details  Name: Janice Short MRN: [id number] Date of Birth: 1961-11-18   Medicare Important Message Given:  Yes  Patient asleep upon time of visit.  Copy of Medicare IM left in room for reference.   Dannette Barbara 10/05/2022, 12:19 PM

## 2022-10-05 NOTE — Progress Notes (Signed)
POD #3 ACDF C4-C5 for central cord syndrome   Subjective/Chief Complaint: She is resting comfortably. No complaints of pain. No issues swallowing. Was able to eat some grits, eggs/cheese for breakfast.   She is moving her right arm better per patient. Still not moving left arm.     Objective: Vital signs in last 24 hours: Temp:  [98.4 F (36.9 C)-100.3 F (37.9 C)] 99.2 F (37.3 C) (10/30 1116) Pulse Rate:  [86-107] 104 (10/30 1116) Resp:  [18-20] 20 (10/30 1116) BP: (110-137)/(52-67) 136/67 (10/30 1116) SpO2:  [96 %-100 %] 100 % (10/30 1116) Last BM Date : 10/01/22  Intake/Output from previous day: 10/29 0701 - 10/30 0700 In: -  Out: 1000 [Urine:1000] Intake/Output this shift: Total I/O In: 120 [P.O.:120] Out: -   She is awake and alert. Answers questions appropriately.   Cervical incision is c/d/I.   She is moving right arm actively.    She has 4/5 strength in right arm. She continues with weakness in left arm 2/5.    Lab Results:  Recent Labs    10/04/22 0611 10/05/22 0643  WBC 12.6* 14.8*  HGB 9.5* 8.7*  HCT 30.0* 27.7*  PLT 200 229   BMET Recent Labs    10/04/22 0611 10/05/22 0643  NA 136 137  K 4.3 4.3  CL 108 108  CO2 22 22  GLUCOSE 125* 151*  BUN 20 19  CREATININE 1.15* 1.28*  CALCIUM 7.8* 7.6*   PT/INR No results for input(s): "LABPROT", "INR" in the last 72 hours. ABG No results for input(s): "PHART", "HCO3" in the last 72 hours.  Invalid input(s): "PCO2", "PO2"  Studies/Results: No results found.  Anti-infectives: Anti-infectives (From admission, onward)    Start     Dose/Rate Route Frequency Ordered Stop   10/02/22 0714  ceFAZolin (ANCEF) 2-4 GM/100ML-% IVPB       Note to Pharmacy: Register, Santiago Glad A: cabinet override      10/02/22 0714 10/02/22 1929       Assessment/Plan: s/p Procedure(s) with comments: ANTERIOR CERVICAL DECOMPRESSION/DISCECTOMY FUSION 1 LEVEL (N/A) - ACDF C4-5.    She is doing reasonable on POD #3  ACDF C4-C5 with central cord syndrome. She continues with weakness in bilateral upper extremities that was not present prior to fall. Pain appears well controlled.   - Continue with PT/OT.  - Agree with rehab evaluation.  - Stable for discharge to rehab from our standpoint when bed is available.   Geronimo Boot PA-C Neurosurgery    LOS: 4 days    Janice Short 10/05/2022

## 2022-10-05 NOTE — Plan of Care (Signed)

## 2022-10-05 NOTE — Progress Notes (Signed)
   10/05/22 1500  Clinical Encounter Type  Visited With Patient and family together  Visit Type Initial  Referral From Nurse  Consult/Referral To [place] responded to request for spiritual comfort and prayer.

## 2022-10-06 DIAGNOSIS — S12101A Unspecified nondisplaced fracture of second cervical vertebra, initial encounter for closed fracture: Secondary | ICD-10-CM | POA: Diagnosis not present

## 2022-10-06 DIAGNOSIS — L8990 Pressure ulcer of unspecified site, unspecified stage: Secondary | ICD-10-CM | POA: Insufficient documentation

## 2022-10-06 LAB — BASIC METABOLIC PANEL
Anion gap: 4 — ABNORMAL LOW (ref 5–15)
BUN: 21 mg/dL (ref 8–23)
CO2: 23 mmol/L (ref 22–32)
Calcium: 8 mg/dL — ABNORMAL LOW (ref 8.9–10.3)
Chloride: 108 mmol/L (ref 98–111)
Creatinine, Ser: 1.41 mg/dL — ABNORMAL HIGH (ref 0.44–1.00)
GFR, Estimated: 42 mL/min — ABNORMAL LOW (ref >60)
Glucose, Bld: 157 mg/dL — ABNORMAL HIGH (ref 70–99)
Potassium: 4.2 mmol/L (ref 3.5–5.1)
Sodium: 135 mmol/L (ref 135–145)

## 2022-10-06 LAB — CBC
HCT: 30.6 % — ABNORMAL LOW (ref 36.0–46.0)
Hemoglobin: 9.6 g/dL — ABNORMAL LOW (ref 12.0–15.0)
MCH: 30.8 pg (ref 26.0–34.0)
MCHC: 31.4 g/dL (ref 30.0–36.0)
MCV: 98.1 fL (ref 80.0–100.0)
Platelets: 272 10*3/uL (ref 150–400)
RBC: 3.12 MIL/uL — ABNORMAL LOW (ref 3.87–5.11)
RDW: 20.2 % — ABNORMAL HIGH (ref 11.5–15.5)
WBC: 17.2 10*3/uL — ABNORMAL HIGH (ref 4.0–10.5)
nRBC: 0 % (ref 0.0–0.2)

## 2022-10-06 LAB — GLUCOSE, CAPILLARY
Glucose-Capillary: 125 mg/dL — ABNORMAL HIGH (ref 70–99)
Glucose-Capillary: 171 mg/dL — ABNORMAL HIGH (ref 70–99)
Glucose-Capillary: 198 mg/dL — ABNORMAL HIGH (ref 70–99)
Glucose-Capillary: 154 mg/dL — ABNORMAL HIGH (ref 70–99)
Glucose-Capillary: 238 mg/dL — ABNORMAL HIGH (ref 70–99)

## 2022-10-06 MED ORDER — ENOXAPARIN SODIUM 40 MG/0.4ML IJ SOSY
40.0000 mg | PREFILLED_SYRINGE | INTRAMUSCULAR | Status: DC
  Administered 2022-10-06 – 2022-10-07 (×2): 40 mg via SUBCUTANEOUS
  Filled 2022-10-06 (×2): qty 0.4

## 2022-10-06 MED ORDER — SODIUM CHLORIDE 0.9 % IV SOLN: INTRAVENOUS | Status: AC

## 2022-10-06 MED ORDER — METHOCARBAMOL 500 MG PO TABS
500.0000 mg | ORAL_TABLET | Freq: Four times a day (QID) | ORAL | Status: DC | PRN
  Administered 2022-10-06: 1000 mg via ORAL
  Administered 2022-10-07: 500 mg via ORAL
  Administered 2022-10-07 (×2): 1000 mg via ORAL
  Filled 2022-10-06: qty 2
  Filled 2022-10-06: qty 1
  Filled 2022-10-06 (×2): qty 2

## 2022-10-06 NOTE — Progress Notes (Signed)
PROGRESS NOTE    Janice Short  DJM:[id number] DOB: 1961/06/27 DOA: 10/01/2022 PCP: Steele Sizer, MD   Brief Narrative:  This 61 years old female with PMH significant for IDDM, chronic bronchitis, intrahepatic cholangiocarcinoma on chemotherapy, last infusion on 09/18/2022 presented in the ED s/p accidental fall.  She was unable to stand up.  She sustained a laceration on the left cheek and complained of left hip pain and bilateral upper extremity numbness since the fall.  Imaging in the ED shows nondisplaced fracture of anterior inferior body of C2.  No epidural hematoma.  Multilevel degenerative changes most prominent at C4 and C5. Patient was placed on cervical collar. ED physician has spoken with neurosurgeon Dr. Cari Caraway who recommended ACDF for acute spinal contusion at C4-C5 due to severe spinal stenosis.  Patient is admitted for further management.  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 She suffered central cord injury causing central cord syndrome status post fall. Patient was evaluated by Dr. Cari Caraway who recommended ACDF. Continue cervical collar. Patient is s/p ACDF POD # 5 Continue adequate pain control. She has slight improvement in pain level/ strength She was reassessed by neurosurgery  recommended pain control and PT and OT eval. Neurosurgery signed off , recommended acute rehab.   Left hip pain: S/p accidental fall. X-ray hip shows no acute fracture or dislocation. Continue adequate pain control.  Cheek lacerations without complication: Lacerations sutured in ED. Continue wound care.  AKI on CKD stage IIIa: Baseline serum creatinine 1.18> 1.26 Creatinine up to 1.41 today, could be due to dehydration. Avoid nephrotoxic medications, recheck creatinine.   Hyperkalemia: > Resolved. Potassium 5.7. Lokelma x1 given. Potassium normalized 4.5  Chronic bronchitis: Continue home inhalers. DuoNebs as needed. Continue Trelegy Ellipta.   Type 2 diabetes with hyperglycemia: Sliding scale insulin coverage Continue Semglee 8 units at bedtime. Carb modified diet   Essential HTN: Continue amlodipine 2.5 mg daily. Blood pressure has improved.   DVT prophylaxis: SCDs Code Status: Full code Family Communication: No family at bedside. Disposition Plan:  Status is: Inpatient Remains inpatient appropriate because:    Admitted s/p accidental fall with central cord injury causing central cord syndrome.  Neurosurgery consulted patient underwent s/p ACDF POD 5, PT and OT recommended acute rehab.  Awaiting bed placement in acute rehab.  Consultants:  Neurosurgery  Procedures: S/p ACDF Antimicrobials: None  Subjective: Patient was seen and examined at the bedside. Overnight events noted. S/p ACDF POD # 5, lying comfortably in the bed, states the pain is better in the neck. She reports improvement in the right arm weakness.  Objective: Vitals:   10/05/22 1942 10/06/22 0041 10/06/22 0500 10/06/22 0736  BP: 121/61 (!) 89/64 124/67 (!) 122/53  Pulse: 97 96 89 86  Resp: 14 16 14 18   Temp: 99.9 F (37.7 C) 98.7 F (37.1  C) 98.5 F (36.9 C) 98.4 F (36.9 C)  TempSrc: Oral Oral    SpO2: 99% 100% 100% 100%  Weight:      Height:        Intake/Output Summary (Last  24 hours) at 10/06/2022 1119 Last data filed at 10/05/2022 1900 Gross per 24 hour  Intake 120 ml  Output 600 ml  Net -480 ml   Filed Weights   10/01/22 1629 10/02/22 0716  Weight: 80 kg 80 kg    Examination:  General exam: Appears comfortable, not in any acute distress, deconditioned. Lacerations noted on the neck and the left cheek, stitches noted. Respiratory system: CTA bilaterally, respiratory effort normal, RR 15 Cardiovascular system: S1 & S2 heard, regular rate and rhythm, no murmur. Gastrointestinal system: Abdomen is soft, non tender, non distended, BS+ Central nervous system: Alert and oriented x 3, no focal neurological deficits. Back: Tenderness noted in the upper back, neck Extremities: No edema, no cyanosis, no clubbing. Skin: No rashes, lesions or ulcers Psychiatry: Judgement and insight appear normal. Mood & affect appropriate.     Data Reviewed: I have personally reviewed following labs and imaging studies  CBC: Recent Labs  Lab 10/01/22 1631 10/03/22 0359 10/04/22 0611 10/05/22 0643 10/06/22 0937  WBC 12.2* 21.6* 12.6* 14.8* 17.2*  NEUTROABS 9.0*  --   --   --   --   HGB 9.7* 9.5* 9.5* 8.7* 9.6*  HCT 31.0* 29.9* 30.0* 27.7* 30.6*  MCV 99.0 98.0 96.5 96.9 98.1  PLT 97* 152 200 229 734   Basic Metabolic Panel: Recent Labs  Lab 10/02/22 0553 10/03/22 0359 10/04/22 0611 10/05/22 0643 10/06/22 0937  NA 140 138 136 137 135  K 4.5 4.5 4.3 4.3 4.2  CL 114* 113* 108 108 108  CO2 21* 22 22 22 23   GLUCOSE 131* 131* 125* 151* 157*  BUN 13 18 20 19 21   CREATININE 1.25* 1.11* 1.15* 1.28* 1.41*  CALCIUM 8.0* 8.5* 7.8* 7.6* 8.0*  MG  --  1.9  --   --   --   PHOS  --  2.6  --   --   --    GFR: Estimated Creatinine Clearance: 44.5 mL/min (A) (by C-G formula based on SCr of 1.41 mg/dL (H)). Liver Function Tests: No results for input(s): "AST", "ALT", "ALKPHOS", "BILITOT", "PROT", "ALBUMIN" in the last 168 hours. No results for input(s): "LIPASE",  "AMYLASE" in the last 168 hours. No results for input(s): "AMMONIA" in the last 168 hours. Coagulation Profile: Recent Labs  Lab 10/01/22 2106  INR 1.2   Cardiac Enzymes: No results for input(s): "CKTOTAL", "CKMB", "CKMBINDEX", "TROPONINI" in the last 168 hours. BNP (last 3 results) No results for input(s): "PROBNP" in the last 8760 hours. HbA1C: No results for input(s): "HGBA1C" in the last 72 hours. CBG: Recent Labs  Lab 10/05/22 1112 10/05/22 1520 10/05/22 2055 10/06/22 0351 10/06/22 0741  GLUCAP 166* 234* 301* 125* 171*   Lipid Profile: No results for input(s): "CHOL", "HDL", "LDLCALC", "TRIG", "CHOLHDL", "LDLDIRECT" in the last 72 hours. Thyroid Function Tests: No results for input(s): "TSH", "T4TOTAL", "FREET4", "T3FREE", "THYROIDAB" in the last 72 hours. Anemia Panel: No results for input(s): "VITAMINB12", "FOLATE", "FERRITIN", "TIBC", "IRON", "RETICCTPCT" in the last 72 hours. Sepsis Labs: No results for input(s): "PROCALCITON", "LATICACIDVEN" in the last 168 hours.  Recent Results (from the past 240 hour(s))  SARS Coronavirus 2 by RT PCR (hospital order, performed in [facility] hospital lab) *cepheid single result test* Anterior Nasal Swab  Status: None   Collection Time: 10/01/22  4:31 PM   Specimen: Anterior Nasal Swab  Result Value Ref Range Status   SARS Coronavirus 2 by RT PCR NEGATIVE NEGATIVE Final    Comment: (NOTE) SARS-CoV-2 target nucleic acids are NOT DETECTED.  The SARS-CoV-2 RNA is generally detectable in upper and lower respiratory specimens during the acute phase of infection. The lowest concentration of SARS-CoV-2 viral copies this assay can detect is 250 copies / mL. A negative result does not preclude SARS-CoV-2 infection and should not be used as the sole basis for treatment or other patient management decisions.  A negative result may occur with improper specimen collection / handling, submission of specimen other than nasopharyngeal  swab, presence of viral mutation(s) within the areas targeted by this assay, and inadequate number of viral copies (<250 copies / mL). A negative result must be combined with clinical observations, patient history, and epidemiological information.  Fact Sheet for Patients:   https://www.patel.info/  Fact Sheet for Healthcare Providers: https://hall.com/  This test is not yet approved or  cleared by the [place] FDA and has been authorized for detection and/or diagnosis of SARS-CoV-2 by FDA under an Emergency Use Authorization (EUA).  This EUA will remain in effect (meaning this test can be used) for the duration of the COVID-19 declaration under Section 564(b)(1) of the Act, 21 U.S.C. section 360bbb-3(b)(1), unless the authorization is terminated or revoked sooner.  Performed at [facility], [address] [place], [place] [address]     Radiology Studies: No results found.  Scheduled Meds:  amLODipine  2.5 mg Oral Daily   atorvastatin  40 mg Oral Daily   escitalopram  20 mg Oral Daily   fluticasone furoate-vilanterol  1 puff Inhalation Daily   And   umeclidinium bromide  1 puff Inhalation Daily   gabapentin  300 mg Oral BID   insulin aspart  0-15 Units Subcutaneous TID AC & HS   insulin glargine-yfgn  8 Units Subcutaneous Q2200   patiromer  8.4 g Oral Daily   Continuous Infusions:   LOS: 5 days   Time spent: 35 mins  Gregorey Nabor, MD [facility]   If 7PM-7AM, please contact night-coverage

## 2022-10-06 NOTE — Progress Notes (Signed)
Inpatient Rehabilitation Admissions Coordinator   I await insurance approval for possible [facility] admit. I spoke with her daughter by phone and she is in agreement.  Danne Baxter, RN, MSN Rehab Admissions Coordinator [phone] 10/06/2022 11:51 AM

## 2022-10-06 NOTE — PMR Pre-admission (Signed)
PMR Admission Coordinator Pre-Admission Assessment  Patient: Janice Short is an 61 y.o., female MRN: [id number] DOB: 10/24/1961 Height: 5' 7" (170.2 cm) Weight: 80 kg  Insurance Information HMO: yes    PPO:      PCP:      IPA:      80/20:      OTHER:  PRIMARY: [employer] Medicare Dual complete   Policy#: [id number]      Subscriber: pt CM Name: Shelby    Phone#: [phone] option #7     Fax#: [phone] Pre-Cert#: [id number]  approved for 13 days    Employer:  Benefits:  Phone #: [phone]     Name: 10/30 Eff. Date: 02/04/22     Deduct: none      Out of Pocket Max: $8300      Life Max: none CIR: $1556 co pay per admission      SNF: no copay per day days 1 until 20; $200 co pay per day days 21 until 100 Outpatient: 80%     Co-Pay: visits per medical neccesity Home Health: 100%      Co-Pay: visits per medical neccesity DME: 80%     Co-Pay: 20% Providers: in network  SECONDARY: Medicaid [place] Access      Policy#: [id number]  Financial Counselor:       Phone#:   The "Data Collection Information Summary" for patients in Inpatient Rehabilitation Facilities with attached "Privacy Act Statement-Health Care Records" was provided and verbally reviewed with: Patient and Family  Emergency Contact Information Contact Information     Name Relation Home Work Mobile   Young,John W Friend [phone]     Convery,Lindsey Daughter   [phone]   Young,Shaundale Son   [phone]      Current Medical History  Patient Admitting Diagnosis: central cord syndrome  History of Present Illness: Janice Short is a 61-year-old right-handed female with history of diabetes mellitus, CKD stage III, chronic anemia, chronic bronchitis, intrahepatic cholangiocarcinoma on chemotherapy last infused 09/18/2022 followed by Dr.Archana Rao, hypertension, hyperlipidemia, gout, obesity with BMI 27.62, tobacco use, carpal tunnel syndrome on left.  Presented to [facility] 10/01/2022 following mechanical fall when  she tripped, fell backwards striking her face on a railing.  She was unable to help herself up.  She sustained a laceration to the left cheek and complained of left hip pain and bilateral upper extremity numbness.    Admission chemistries unremarkable except potassium 5.7, creatinine 1.30, GFR 46, calcium 7.9, glucose 243, hemoglobin 9.7, WBC 12,200.  Patient's laceration to left cheek was sutured in the ED.  Cranial CT scan negative for intracranial abnormality.  CT maxillofacial negative.  CT left hip negative for fracture.  CT cervical spine findings consistent with nondisplaced teardrop type fracture involving the anterior inferior aspect of the C2 vertebral body as well as severe stenosis C4-5 with myelomalacia.  Neurosurgery consulted underwent anterior cervical discectomy decompression and fusion/cervical instrumentation C4-5 10/02/2022 per Dr. Yarbrough.  Hard cervical collar when out of bed and can be applied in the sitting position.  Hospital course AKI on CKD stage III baseline serum creatinine 1.18 > 1.26.  Noted hyperkalemia 5.7 responded well to Lokelma with latest potassium 4.2 and creatinine 1.41.  Acute on chronic anemia latest hemoglobin 9.6.  She was cleared to begin Lovenox for DVT prophylaxis.    Patient's medical record from [facility] has been reviewed by the rehabilitation admission coordinator and physician.  Past Medical History  Past Medical History:  Diagnosis Date     Allergy    Asthma    Carpal tunnel syndrome on left    Cervical radiculitis    Chronic osteoarthritis    Corns and callosity    Depression    Dermatophytosis of foot    Diabetes mellitus without complication (HCC)    Gout    Hyperlipidemia    Hypertension    Impingement syndrome of left shoulder    Intrahepatic cholangiocarcinoma (HCC)    Lumbosacral neuritis    Microalbuminuria    Obesity    Supraventricular tachycardia 07/01/2015   SVT (supraventricular tachycardia)    Tenosynovitis of wrist    Vitamin  D deficiency    Has the patient had major surgery during 100 days prior to admission? Yes  Family History   family history includes CAD in her mother; Heart attack in her mother; Kidney disease in her father.  Current Medications  Current Facility-Administered Medications:    0.9 %  sodium chloride infusion, , Intravenous, Continuous, Kumar, Pardeep, MD   acetaminophen (TYLENOL) tablet 650 mg, 650 mg, Oral, Q6H PRN, 650 mg at 10/05/22 2215 **OR** acetaminophen (TYLENOL) suppository 650 mg, 650 mg, Rectal, Q6H PRN, Yarbrough, Chester, MD   albuterol (PROVENTIL) (2.5 MG/3ML) 0.083% nebulizer solution 2.5 mg, 2.5 mg, Nebulization, Q2H PRN, Yarbrough, Chester, MD   amLODipine (NORVASC) tablet 2.5 mg, 2.5 mg, Oral, Daily, Kumar, Pardeep, MD, 2.5 mg at 10/06/22 1003   atorvastatin (LIPITOR) tablet 40 mg, 40 mg, Oral, Daily, Yarbrough, Chester, MD, 40 mg at 10/06/22 1149   enoxaparin (LOVENOX) injection 40 mg, 40 mg, Subcutaneous, Q24H, Kumar, Pardeep, MD   escitalopram (LEXAPRO) tablet 20 mg, 20 mg, Oral, Daily, Yarbrough, Chester, MD, 20 mg at 10/06/22 1004   fluticasone furoate-vilanterol (BREO ELLIPTA) 100-25 MCG/ACT 1 puff, 1 puff, Inhalation, Daily, 1 puff at 10/06/22 1015 **AND** umeclidinium bromide (INCRUSE ELLIPTA) 62.5 MCG/ACT 1 puff, 1 puff, Inhalation, Daily, Yarbrough, Chester, MD, 1 puff at 10/06/22 1015   gabapentin (NEURONTIN) capsule 300 mg, 300 mg, Oral, BID, Yarbrough, Chester, MD, 300 mg at 10/06/22 1003   insulin aspart (novoLOG) injection 0-15 Units, 0-15 Units, Subcutaneous, TID AC & HS, Kumar, Pardeep, MD, 3 Units at 10/06/22 0900   insulin glargine-yfgn (SEMGLEE) injection 8 Units, 8 Units, Subcutaneous, Q2200, Yarbrough, Chester, MD, 8 Units at 10/05/22 2215   methocarbamol (ROBAXIN) tablet 500 mg, 500 mg, Oral, Q6H PRN, Yarbrough, Chester, MD, 500 mg at 10/06/22 1224   morphine (PF) 2 MG/ML injection 2 mg, 2 mg, Intravenous, Q2H PRN, Yarbrough, Chester, MD, 2 mg at 10/06/22  1224   ondansetron (ZOFRAN) tablet 4 mg, 4 mg, Oral, Q6H PRN **OR** ondansetron (ZOFRAN) injection 4 mg, 4 mg, Intravenous, Q6H PRN, Yarbrough, Chester, MD, 4 mg at 10/02/22 0119   Oral care mouth rinse, 15 mL, Mouth Rinse, PRN, Kumar, Pardeep, MD   oxyCODONE (Oxy IR/ROXICODONE) immediate release tablet 5 mg, 5 mg, Oral, Q3H PRN, Yarbrough, Chester, MD, 5 mg at 10/06/22 1147  Facility-Administered Medications Ordered in Other Encounters:    heparin lock flush 100 UNIT/ML injection, , , ,   Patients Current Diet:  Diet Order             DIET SOFT Room service appropriate? Yes; Fluid consistency: Thin  Diet effective now                  Precautions / Restrictions Precautions Precautions: Cervical, Fall Precaution Booklet Issued: No Precaution Comments: Only needs brace when up and out of bed; may remove brace when in   bed; apply/remove brace while sitting; may remove brace to shower; may ambulate to bathroom without brace; MAP >80 Cervical Brace: Hard collar ([place] J collar) Restrictions Weight Bearing Restrictions: No   Has the patient had 2 or more falls or a fall with injury in the past year? Yes  Prior Activity Level Limited Community (1-2x/wk): Independent  Prior Functional Level Self Care: Did the patient need help bathing, dressing, using the toilet or eating? Independent  Indoor Mobility: Did the patient need assistance with walking from room to room (with or without device)? Independent  Stairs: Did the patient need assistance with internal or external stairs (with or without device)? Independent  Functional Cognition: Did the patient need help planning regular tasks such as shopping or remembering to take medications? Independent  Patient Information Are you of Hispanic, Latino/a,or Spanish origin?: A. No, not of Hispanic, Latino/a, or Spanish origin What is your race?: B. Black or African American Do you need or want an interpreter to communicate with a doctor  or health care staff?: 0. No  Patient's Response To:  Health Literacy and Transportation Is the patient able to respond to health literacy and transportation needs?: Yes Health Literacy - How often do you need to have someone help you when you read instructions, pamphlets, or other written material from your doctor or pharmacy?: Never In the past 12 months, has lack of transportation kept you from medical appointments or from getting medications?: No In the past 12 months, has lack of transportation kept you from meetings, work, or from getting things needed for daily living?: No  Home Assistive Devices / Equipment Home Assistive Devices/Equipment: None Home Equipment: BSC/3in1, Cane - single point  Prior Device Use: Indicate devices/aids used by the patient prior to current illness, exacerbation or injury? None of the above  Current Functional Level Cognition  Overall Cognitive Status: Within Functional Limits for tasks assessed Orientation Level: Oriented X4 General Comments: Pt very motivated despite pain.    Extremity Assessment (includes Sensation/Coordination)  Upper Extremity Assessment: RUE deficits/detail, LUE deficits/detail RUE Deficits / Details: fair grip strength, wrist extension/flexion 3/5, elbow flexion/extension at least 3/5, shoulder flexion and abduction at least 3/5 (at least 90 degrees) RUE: Unable to fully assess due to pain LUE Deficits / Details: 0/5 grip strength, 2-/5 wrist extension, 0/5 wrist flexion, 3-/5 elbow flexion (with pain in L elbow), 1/5 elbow extension, shoulder flexion 2-/5, shoulder abduction 3/5 (limited to <90 deg), compensating during testing with shoulder movement LUE: Unable to fully assess due to pain  Lower Extremity Assessment: Generalized weakness RLE Deficits / Details: hip flexion at least 2+/5; knee flexion/extension at least 2/5 (limited d/t R knee pain); DF/PF at least 3/5 AROM RLE: Unable to fully assess due to pain LLE Deficits  / Details: hip flexion at least 2+/5; knee flexion/extension at least 2+/5; DF/PF at least 3/5 AROM    ADLs  Overall ADL's : Needs assistance/impaired Eating/Feeding: Set up, Bed level Eating/Feeding Details (indicate cue type and reason): Pt completing self-feeding with R hand, some difficulty accessing items on tray, attempted to set up pt for optimal positioning Grooming: Moderate assistance, Wash/dry face, Sitting Upper Body Dressing : Maximal assistance, Bed level Upper Body Dressing Details (indicate cue type and reason): to don/doff C collar Lower Body Dressing: Maximal assistance, Sitting/lateral leans Lower Body Dressing Details (indicate cue type and reason): to adjust socks    Mobility  Overal bed mobility: Needs Assistance Bed Mobility: Rolling, Sidelying to Sit, Sit to Supine Rolling: Mod   assist Sidelying to sit: +2 for physical assistance, Mod assist Supine to sit: Max assist, +2 for physical assistance Sit to supine: Max assist, +2 for physical assistance General bed mobility comments: assist for trunk and B LE's; assist for logrolling technique    Transfers  Overall transfer level: Needs assistance Equipment used: None Transfers: Sit to/from Stand Sit to Stand: Total assist, +2 physical assistance General transfer comment: uanble to clear hips off bed - trialed walker but unable to use due to LUE limitations    Ambulation / Gait / Stairs / Wheelchair Mobility  Ambulation/Gait General Gait Details: not appropriate at this time    Posture / Balance Dynamic Sitting Balance Sitting balance - Comments: close SBA to min assist (pt intermittently adjusting herself in bed and leaning R or forward) Balance Overall balance assessment: Needs assistance Sitting-balance support: Feet supported, Bilateral upper extremity supported Sitting balance-Leahy Scale: Fair Sitting balance - Comments: close SBA to min assist (pt intermittently adjusting herself in bed and leaning R or  forward) Postural control: Right lateral lean Standing balance support: Bilateral upper extremity supported, No upper extremity supported Standing balance-Leahy Scale: Zero Standing balance comment: unable to stand at this time    Special needs/care consideration Palliative Medicine, Joshua Borders involved as an outpatient   Previous Home Environment  Living Arrangements:  (Lives with adult daughter, Lindsey and pt's 4 year old grandson)  Lives With: Daughter Available Help at Discharge: Family, Available 24 hours/day (daughter works remote from home) Type of Home: Mobile home Home Layout: One level Home Access: Ramped entrance Bathroom Shower/Tub: Tub/shower unit Bathroom Toilet: Standard Bathroom Accessibility: Yes How Accessible: Accessible via walker Home Care Services: No  Discharge Living Setting Plans for Discharge Living Setting: Lives with (comment), Mobile Home (daughter) Type of Home at Discharge: Mobile home Discharge Home Layout: One level Discharge Home Access: Ramped entrance Discharge Bathroom Shower/Tub: Tub/shower unit Discharge Bathroom Toilet: Standard Discharge Bathroom Accessibility: Yes How Accessible: Accessible via walker Does the patient have any problems obtaining your medications?: No  Social/Family/Support Systems Patient Roles: Parent Contact Information: daughter, Lindsey Anticipated Caregiver: daughter Anticipated Caregiver's Contact Information: see contacts Ability/Limitations of Caregiver: daughter works home remote and has 4 year old autistic child Caregiver Availability: 24/7 Discharge Plan Discussed with Primary Caregiver: Yes Is Caregiver In Agreement with Plan?: Yes Does Caregiver/Family have Issues with Lodging/Transportation while Pt is in Rehab?: No  Daughter works remote from home. Does not drive. Son works night shift and provides all transportation .  Goals Patient/Family Goal for Rehab: supervision PT, supervision to min  OT Expected length of stay: ELOS 3 to 4 weeks Pt/Family Agrees to Admission and willing to participate: Yes Program Orientation Provided & Reviewed with Pt/Caregiver Including Roles  & Responsibilities: Yes  Decrease burden of Care through IP rehab admission:n/a  Possible need for SNF placement upon discharge: not anticipated  Patient Condition: I have reviewed medical records from [facility], spoken with CSW, and patient, daughter, and family member. I met with patient at the bedside and discussed via phone for inpatient rehabilitation assessment.  Patient will benefit from ongoing PT and OT, can actively participate in 3 hours of therapy a day 5 days of the week, and can make measurable gains during the admission.  Patient will also benefit from the coordinated team approach during an Inpatient Acute Rehabilitation admission.  The patient will receive intensive therapy as well as Rehabilitation physician, nursing, social worker, and care management interventions.  Due to bladder management, bowel management, safety,   skin/wound care, disease management, medication administration, pain management, and patient education the patient requires 24 hour a day rehabilitation nursing.  The patient is currently *** with mobility and basic ADLs.  Discharge setting and therapy post discharge at home with home health is anticipated.  Patient has agreed to participate in the Acute Inpatient Rehabilitation Program and will admit today.  Preadmission Screen Completed By:  Cleatrice Burke, 10/06/2022 1:58 PM ______________________________________________________________________   Discussed status with Dr. Marland Kitchen on *** at *** and received approval for admission today.  Admission Coordinator:  Cleatrice Burke, RN, time Marland KitchenSudie Grumbling ***   Assessment/Plan: Diagnosis: Does the need for close, 24 hr/day Medical supervision in concert with the patient's rehab needs make it unreasonable for this patient to be served  in a less intensive setting? {yes_no_potentially:3041433} Co-Morbidities requiring supervision/potential complications: *** Due to {due SU:1103159}, does the patient require 24 hr/day rehab nursing? {yes_no_potentially:3041433} Does the patient require coordinated care of a physician, rehab nurse, PT, OT, and SLP to address physical and functional deficits in the context of the above medical diagnosis(es)? {yes_no_potentially:3041433} Addressing deficits in the following areas: {deficits:3041436} Can the patient actively participate in an intensive therapy program of at least 3 hrs of therapy 5 days a week? {yes_no_potentially:3041433} The potential for patient to make measurable gains while on inpatient rehab is {potential:3041437} Anticipated functional outcomes upon discharge from inpatient rehab: {functional outcomes:304600100} PT, {functional outcomes:304600100} OT, {functional outcomes:304600100} SLP Estimated rehab length of stay to reach the above functional goals is: *** Anticipated discharge destination: {anticipated dc setting:21604} 10. Overall Rehab/Functional Prognosis: {potential:3041437}   MD Signature: ***

## 2022-10-06 NOTE — Progress Notes (Signed)
Physical Therapy Treatment Patient Details Name: Janice Short MRN: [id number] DOB: [birth year] Today's Date: 10/06/2022   History of Present Illness Pt is a 61 y.o. female presenting to ED 10/26 s/p fall (head was hit on bed rail during fall).  Large laceration to face and shortened and externally rotate hip noted by EMS.  Imaging showing nondisplaced teardrop type fracture involving the anterior inferior aspect of the C2 vertebral body.  Pt admitted with closed fx of C2 vertebra; contusion of cervical cord C4-5 d/t accidental fall, L hip pain (x-ray hip showing no fx), laceration L cheek, hyperkalemia, and simple chronic bronchitis.  S/p ACDF C4-5 10/02/22.  PMH includes spinal stenosis, DM, metastatic intrahepatic cholangiocarcinoma on chemotherapy, htn, HLD, CTS.    PT Comments    Co-tx with OT.  Pt premedicated and unable to have more meds at this time per nursing.  She is assisted with donning neck brace in supine.  She is self motivated to attempt mobility on her own and needs increased time to try on her own but does need mod a x 2 to get to sitting.  She is inc of urine and needs full bed change which is started in sitting.  Attempted to stand x 2 from EOB but she is unable to clear hips.  Difficult to tell if weakness prevents her from trying or fear of pain causing her to have challenges engaging muscles to assist.  She is assisted back to supine with max a x 2 and repositioned for comfort.  It is noted that she has a small area of breakdown in gluteal fold.  RN in and applied sacral patch.   She requires max a to scoot up in bed.  Brace is removed in supine and she is educated to watch neck extension as she does seem to push her head back into bed when trying to scoot up in bed.   Recommendations for follow up therapy are one component of a multi-disciplinary discharge planning process, led by the attending physician.  Recommendations may be updated based on patient status, additional  functional criteria and insurance authorization.  Follow Up Recommendations  Acute inpatient rehab (3hours/day)     Assistance Recommended at Discharge Frequent or constant Supervision/Assistance  Patient can return home with the following Two people to help with walking and/or transfers;Two people to help with bathing/dressing/bathroom;Assistance with cooking/housework;Assistance with feeding;Direct supervision/assist for medications management;Assist for transportation;Help with stairs or ramp for entrance   Equipment Recommendations  Other (comment) (TBD at next facility)    Recommendations for Other Services Rehab consult     Precautions / Restrictions Precautions Precautions: Cervical;Fall Precaution Comments: Only needs brace when up and out of bed; may remove brace when in bed; apply/remove brace while sitting; may remove brace to shower; may ambulate to bathroom without brace; MAP >80 Required Braces or Orthoses: Cervical Brace Cervical Brace: Hard collar ([place] J collar) Restrictions Weight Bearing Restrictions: No     Mobility  Bed Mobility Overal bed mobility: Needs Assistance Bed Mobility: Rolling, Sidelying to Sit, Sit to Supine Rolling: Mod assist Sidelying to sit: +2 for physical assistance, Mod assist   Sit to supine: Max assist, +2 for physical assistance        Transfers Overall transfer level: Needs assistance Equipment used: None Transfers: Sit to/from Stand Sit to Stand: Total assist, +2 physical assistance           General transfer comment: uanble to clear hips off bed - trialed walker but unable  to use due to LUE limitations    Ambulation/Gait               General Gait Details: not appropriate at this time   Stairs             Wheelchair Mobility    Modified Rankin (Stroke Patients Only)       Balance Overall balance assessment: Needs assistance Sitting-balance support: Feet supported, Bilateral upper extremity  supported Sitting balance-Leahy Scale: Fair Sitting balance - Comments: close SBA to min assist (pt intermittently adjusting herself in bed and leaning R or forward)   Standing balance support: Bilateral upper extremity supported, No upper extremity supported Standing balance-Leahy Scale: Zero Standing balance comment: unable to stand at this time                            Cognition Arousal/Alertness: Awake/alert Behavior During Therapy: WFL for tasks assessed/performed Overall Cognitive Status: Within Functional Limits for tasks assessed                                          Exercises      General Comments        Pertinent Vitals/Pain Pain Assessment Pain Assessment: Faces Pain Score: 10-Worst pain ever Pain Location: yells out frequently during session Pain Descriptors / Indicators: Constant, Discomfort, Grimacing, Guarding, Sharp Pain Intervention(s): Limited activity within patient's tolerance, Repositioned, Premedicated before session    Home Living                          Prior Function            PT Goals (current goals can now be found in the care plan section)      Frequency    7X/week      PT Plan Current plan remains appropriate    Co-evaluation PT/OT/SLP Co-Evaluation/Treatment: Yes Reason for Co-Treatment: Complexity of the patient's impairments (multi-system involvement) PT goals addressed during session: Mobility/safety with mobility OT goals addressed during session: ADL's and self-care      AM-PAC PT "6 Clicks" Mobility   Outcome Measure  Help needed turning from your back to your side while in a flat bed without using bedrails?: A Lot Help needed moving from lying on your back to sitting on the side of a flat bed without using bedrails?: Total Help needed moving to and from a bed to a chair (including a wheelchair)?: Total Help needed standing up from a chair using your arms (e.g., wheelchair  or bedside chair)?: Total Help needed to walk in hospital room?: Total Help needed climbing 3-5 steps with a railing? : Total 6 Click Score: 7    End of Session Equipment Utilized During Treatment: Gait belt;Cervical collar Activity Tolerance: Patient limited by pain Patient left: in bed;with call bell/phone within reach;with bed alarm set;Other (comment) (B heels floating via pillows) Nurse Communication: Mobility status;Precautions;Other (comment) (pt's pain status and pt's BP MAP) PT Visit Diagnosis: Other abnormalities of gait and mobility (R26.89);Muscle weakness (generalized) (M62.81);History of falling (Z91.81);Pain Pain - Right/Left: Right Pain - part of body: Knee     Time: 1059-1130 PT Time Calculation (min) (ACUTE ONLY): 31 min  Charges:  $Therapeutic Activity: 8-22 mins  Chesley Noon, PTA 10/06/22, 12:04 PM

## 2022-10-06 NOTE — Inpatient Diabetes Management (Signed)
Inpatient Diabetes Program Recommendations  AACE/ADA: New Consensus Statement on Inpatient Glycemic Control (2015)  Target Ranges:  Prepandial:   less than 140 mg/dL      Peak postprandial:   less than 180 mg/dL (1-2 hours)      Critically ill patients:  140 - 180 mg/dL   Lab Results  Component Value Date   GLUCAP 171 (H) 10/06/2022   HGBA1C 5.9 (A) 09/25/2022    Review of Glycemic Control  Latest Reference Range & Units 10/05/22 07:42 10/05/22 11:12 10/05/22 15:20 10/05/22 20:55 10/06/22 03:51 10/06/22 07:41  Glucose-Capillary 70 - 99 mg/dL 141 (H) 166 (H) 234 (H) 301 (H) 125 (H) 171 (H)   Diabetes history: DM 2 Outpatient Diabetes medications: Tresiba 20 units Current orders for Inpatient glycemic control:  Semglee 8 units Novolog 0-15 units tid and hs  A1c 5.9% on 10/20 OR on 10/27 Decadron given during surgery am of 10/27  Inpatient Diabetes Program Recommendations:    Note: glucose trends increase after meal intake. If in the plan of care may consider:  -   Novolog 3 units tid meal coverage if eating >50% of meals  Thanks,  Tama Headings RN, MSN, BC-ADM Inpatient Diabetes Coordinator Team Pager [phone] (8a-5p)

## 2022-10-06 NOTE — Progress Notes (Signed)
Referring Physician:  No referring provider defined for this encounter.  Primary Physician:  Steele Sizer, MD   10/06/2022 No acute issues overnight  History of Present Illness:  Janice Short is here today with a chief complaint of mechanical fall yesterday.  She struck her face on a rail, and had immediate onset of change in her strength in her upper extremities.  She also had difficulty moving her left hand and right hand.  She lost strength in her left arm.  She was unable to get herself up from her fall, and was then brought in for evaluation. During evaluation, she was noted to have C2 fracture as well as severe stenosis at C4-5 with myelomalacia.  DOS: 10/02/22 - C4-5 ACDF  Interval History: Janice Short went to surgery yesterday for a C4-5 ACDF.  She has been able to tolerate some p.o. intake but her appetite is low.  She has some mild soreness with her throat but is able to swallow.  She has not noticed any swelling in her incision.  She does feel that she is improving slowly.  Review of Systems:  A 10 point review of systems is negative, except for the pertinent positives and negatives detailed in the HPI.  Past Medical History: Past Medical History:  Diagnosis Date   Allergy    Asthma    Carpal tunnel syndrome on left    Cervical radiculitis    Chronic osteoarthritis    Corns and callosity    Depression    Dermatophytosis of foot    Diabetes mellitus without complication (HCC)    Gout    Hyperlipidemia    Hypertension    Impingement syndrome of left shoulder    Intrahepatic cholangiocarcinoma (HCC)    Lumbosacral neuritis    Microalbuminuria    Obesity    Supraventricular tachycardia 07/01/2015   SVT (supraventricular tachycardia)    Tenosynovitis of wrist    Vitamin D deficiency     Past Surgical History: Past Surgical History:  Procedure Laterality Date   ABDOMINAL HYSTERECTOMY     ANTERIOR CERVICAL DECOMP/DISCECTOMY FUSION N/A 10/02/2022    Procedure: ANTERIOR CERVICAL DECOMPRESSION/DISCECTOMY FUSION 1 LEVEL;  Surgeon: Meade Maw, MD;  Location: [facility] ORS;  Service: Neurosurgery;  Laterality: N/A;  ACDF C4-5.   COLONOSCOPY WITH PROPOFOL N/A 05/06/2021   Procedure: COLONOSCOPY WITH PROPOFOL;  Surgeon: Lucilla Lame, MD;  Location: [facility] ENDOSCOPY;  Service: Endoscopy;  Laterality: N/A;   ESOPHAGOGASTRODUODENOSCOPY (EGD) WITH PROPOFOL N/A 05/06/2021   Procedure: ESOPHAGOGASTRODUODENOSCOPY (EGD) WITH PROPOFOL;  Surgeon: Lucilla Lame, MD;  Location: [facility] ENDOSCOPY;  Service: Endoscopy;  Laterality: N/A;   PORTA CATH INSERTION N/A 07/17/2021   Procedure: PORTA CATH INSERTION;  Surgeon: Algernon Huxley, MD;  Location: [place] [facility] LAB;  Service: Cardiovascular;  Laterality: N/A;    Allergies: Allergies as of 10/01/2022 - Review Complete 10/01/2022  Allergen Reaction Noted   Other Shortness Of Breath 09/01/2016   Cisplatin Itching 11/10/2021   Bydureon [exenatide] Other (See Comments) 06/18/2017    Medications: Current Meds  Medication Sig   albuterol (VENTOLIN HFA) 108 (90 Base) MCG/ACT inhaler INHALE TWO PUFFS BY MOUTH EVERY 6 HOURS AS NEEDED FOR WHEEZING AND FOR SHORTNESS OF BREATH (BULK)   aspirin 81 MG tablet Take 1 tablet by mouth daily.   atorvastatin (LIPITOR) 40 MG tablet Take 1 tablet (40 mg total) by mouth daily.   blood glucose meter kit and supplies Dispense based on patient and insurance preference. Use up to four  times daily as directed. (FOR ICD-10 E10.9, E11.9).   diphenhydramine-acetaminophen (TYLENOL PM) 25-500 MG TABS tablet Take 1 tablet by mouth at bedtime as needed.   escitalopram (LEXAPRO) 20 MG tablet TAKE ONE TABLET BY MOUTH DAILY AT 9AM   fluticasone (FLONASE) 50 MCG/ACT nasal spray Place 2 sprays into both nostrils daily as needed. USE 2 SPRAY(S) IN EACH NOSTRIL AT BEDTIME   Fluticasone-Umeclidin-Vilant (TRELEGY ELLIPTA) 100-62.5-25 MCG/ACT AEPB Inhale 1 puff into the lungs daily. (Patient taking  differently: Inhale 1 puff into the lungs daily.)   gabapentin (NEURONTIN) 300 MG capsule TAKE TWO CAPSULES BY MOUTH TWICE DAILY @ 9AM & 5PM   insulin degludec (TRESIBA FLEXTOUCH) 100 UNIT/ML FlexTouch Pen Inject 20 Units into the skin daily.   lidocaine-prilocaine (EMLA) cream Apply 1 Application topically as needed. Apply to port and cover with saran wrap 1-2 hours prior to port access    Social History: Social History   Tobacco Use   Smoking status: Some Days    Types: Cigarettes   Smokeless tobacco: Never   Tobacco comments:    1 per day 01/30/21 does not inhale it  Vaping Use   Vaping Use: Never used  Substance Use Topics   Alcohol use: Not Currently   Drug use: Not Currently    Types: Benzodiazepines    Family Medical History: Family History  Problem Relation Age of Onset   Heart attack Mother    CAD Mother    Kidney disease Father     Physical Examination: Vitals:   10/06/22 0500 10/06/22 0736  BP: 124/67 (!) 122/53  Pulse: 89 86  Resp: 14 18  Temp: 98.5 F (36.9 C) 98.4 F (36.9 C)  SpO2: 100% 100%    General: Patient is calm collected, and in no apparent distress. Attention to examination is appropriate.  Neck: Incision clean dry and flat.   Respiratory: Patient is breathing without any difficulty.  NEUROLOGICAL:     Awake, alert, oriented to person, place.  Speech is clear and fluent. Fund of knowledge is appropriate.  Laceration on R cheek sewn together.    Side Biceps Triceps Deltoid Interossei Grip Wrist Ext. Wrist Flex.  R _0 4- 4- 4-  L 4- _1 Side Iliopsoas Quads Hamstring PF DF EHL  R _2 L _3 Incision c/d/i  Medical Decision Making  Imaging: MRI C spine 10/01/22 IMPRESSION: 1. Nondisplaced fracture anterior inferior body of C2 not appreciated by MRI. No other fracture. No epidural hematoma. 2. Multilevel degenerative change most prominent at C4-5. Moderate spinal stenosis at C4-5 with  bilateral cord hyperintensity compatible with chronic compressive myelopathy.     Electronically Signed   By: Franchot Gallo M.D.   On: 10/01/2022 20:17  CT C spine 10/01/22 IMPRESSION: 1. No CT evidence for acute intracranial abnormality. Patchy white matter hypodensity likely chronic small vessel ischemic change. 2. Findings consistent with nondisplaced teardrop type fracture involving the anterior inferior aspect of the C2 vertebral body. 3. Multilevel moderate severe degenerative changes   Critical Value/emergent results were called by telephone at the time of interpretation on 10/01/2022 at 5:56 pm to provider [facility] , who verbally acknowledged these results.     Electronically Signed   By: Donavan Foil M.D.   On: 10/01/2022 17:56  I have personally reviewed the images and agree with the above interpretation.  Assessment and Plan:  Janice Short is a pleasant 61 y.o. female with C2 fracture that appears to be stable.  She has an incomplete spinal cord injury consistent with a central cord injury.    Continue PTOT Consider dispo planning  Meade Maw MD Neurosurgery

## 2022-10-06 NOTE — Progress Notes (Signed)
Occupational Therapy Treatment Patient Details Name: Janice Short MRN: [id number] DOB: 10-27-1961 Today's Date: 10/06/2022   History of present illness Pt is a 61 y.o. female presenting to ED 10/26 s/p fall (head was hit on bed rail during fall).  Large laceration to face and shortened and externally rotate hip noted by EMS.  Imaging showing nondisplaced teardrop type fracture involving the anterior inferior aspect of the C2 vertebral body.  Pt admitted with closed fx of C2 vertebra; contusion of cervical cord C4-5 d/t accidental fall, L hip pain (x-ray hip showing no fx), laceration L cheek, hyperkalemia, and simple chronic bronchitis.  S/p ACDF C4-5 10/02/22.  PMH includes spinal stenosis, DM, metastatic intrahepatic cholangiocarcinoma on chemotherapy, htn, HLD, CTS.   OT comments  Pt agreeable to OT/PT co-treatment to maximize safety and participation. Tx session targeted improving functional balance during ADL tasks in preparation for improved functional transfers and ADL task completion. Pt was premedicated before session, however, still reporting 10/10 pain in low back, BLEs, and neck with movement. [place] J collar donned in supine. Pt required Mod A +2 to sit at EOB. Pt found to be incontinent of urine requiring complete linen change. Attempting 2x to stand this date, however, unable to clear hips from EOB. Pt returned to supine with Max A +2 and completed log rolling with Mod A to finish linen change. Pt noted to have small area of skin breakdown in gluteal fold, NT present to apply sacral patch. Pt left as received with all needs in reach. Pt is making progress toward goal completion. D/C recommendation remains appropriate. OT will continue to follow acutely.     Recommendations for follow up therapy are one component of a multi-disciplinary discharge planning process, led by the attending physician.  Recommendations may be updated based on patient status, additional functional criteria and  insurance authorization.    Follow Up Recommendations  Acute inpatient rehab (3hours/day)    Assistance Recommended at Discharge Frequent or constant Supervision/Assistance  Patient can return home with the following  Two people to help with walking and/or transfers;Assistance with cooking/housework;Assist for transportation;Help with stairs or ramp for entrance;Assistance with feeding;Two people to help with bathing/dressing/bathroom;Direct supervision/assist for medications management   Equipment Recommendations  Other (comment) (defer to next venue of care)    Recommendations for Other Services      Precautions / Restrictions Precautions Precautions: Cervical;Fall Precaution Booklet Issued: No Precaution Comments: Only needs brace when up and out of bed; may remove brace when in bed; apply/remove brace while sitting; may remove brace to shower; may ambulate to bathroom without brace; MAP >80 Required Braces or Orthoses: Cervical Brace Cervical Brace: Hard collar ([place] J collar) Restrictions Weight Bearing Restrictions: No       Mobility Bed Mobility Overal bed mobility: Needs Assistance Bed Mobility: Rolling, Sidelying to Sit, Sit to Supine Rolling: Mod assist Sidelying to sit: +2 for physical assistance, Mod assist   Sit to supine: Max assist, +2 for physical assistance        Transfers Overall transfer level: Needs assistance Equipment used: none Transfers: Sit to/from Stand Sit to Stand: Total assist, +2 physical assistance           General transfer comment: unable to clear hips off bed - trialed walker but unable to use due to LUE limitations     Balance Overall balance assessment: Needs assistance Sitting-balance support: Feet supported, Bilateral upper extremity supported Sitting balance-Leahy Scale: Fair Sitting balance - Comments: close SBA to min assist (  pt intermittently adjusting herself in bed and leaning R or forward)   Standing balance  support: Bilateral upper extremity supported, No upper extremity supported Standing balance-Leahy Scale: Zero Standing balance comment: unable to stand at this time                           ADL either performed or assessed with clinical judgement   ADL Overall ADL's : Needs assistance/impaired                 Upper Body Dressing : Maximal assistance;Bed level Upper Body Dressing Details (indicate cue type and reason): to don/doff C collar         Toileting- Clothing Manipulation and Hygiene: Maximal assistance;Bed level              Extremity/Trunk Assessment Upper Extremity Assessment Upper Extremity Assessment: RUE deficits/detail;LUE deficits/detail RUE Deficits / Details: fair grip strength, wrist extension/flexion 3/5, elbow flexion/extension at least 3/5, shoulder flexion and abduction at least 3/5 (at least 90 degrees) LUE Deficits / Details: 0/5 grip strength, 2-/5 wrist extension, 0/5 wrist flexion, 3-/5 elbow flexion (with pain in L elbow), 1/5 elbow extension, shoulder flexion 2-/5, shoulder abduction 3/5 (limited to <90 deg), compensating during testing with shoulder movement   Lower Extremity Assessment Lower Extremity Assessment: Generalized weakness   Cervical / Trunk Assessment Cervical / Trunk Assessment: Neck Surgery    Vision Patient Visual Report: No change from baseline     Perception     Praxis      Cognition Arousal/Alertness: Awake/alert Behavior During Therapy: WFL for tasks assessed/performed Overall Cognitive Status: Within Functional Limits for tasks assessed                                          Exercises Other Exercises Other Exercises: OT providing education re: AROM/PROM exercises for L hand to improve strength and ROM    Shoulder Instructions       General Comments      Pertinent Vitals/ Pain       Pain Assessment Pain Assessment: Faces Pain Score: 10-Worst pain ever Pain Location:  BLEs, low back, neck Pain Descriptors / Indicators: Constant, Discomfort, Grimacing, Guarding, Sharp, Crying Pain Intervention(s): Limited activity within patient's tolerance, Repositioned, Premedicated before session  Home Living   Living Arrangements:  (Lives with adult daughter, Mendel Ryder and pt's 58 year old grandson) Available Help at Discharge: Family;Available 24 hours/day (daughter works remote from home)                     Architectural technologist: Yes How Accessible: Accessible via walker        Lives With: Daughter    Prior Functioning/Environment              Frequency  Min 4X/week        Progress Toward Goals  OT Goals(current goals can now be found in the care plan section)  Progress towards OT goals: Progressing toward goals  Acute Rehab OT Goals Patient Stated Goal: reduce pain and return to PLOF OT Goal Formulation: With patient Time For Goal Achievement: 10/18/22 Potential to Achieve Goals: Good  Plan Discharge plan remains appropriate;Frequency remains appropriate    Co-evaluation    PT/OT/SLP Co-Evaluation/Treatment: Yes Reason for Co-Treatment: Complexity of the patient's impairments (multi-system involvement);For patient/therapist safety;To address functional/ADL transfers PT goals  addressed during session: Mobility/safety with mobility OT goals addressed during session: ADL's and self-care      AM-PAC OT "6 Clicks" Daily Activity     Outcome Measure   Help from another person eating meals?: A Little Help from another person taking care of personal grooming?: A Lot Help from another person toileting, which includes using toliet, bedpan, or urinal?: A Lot Help from another person bathing (including washing, rinsing, drying)?: A Lot Help from another person to put on and taking off regular upper body clothing?: A Lot Help from another person to put on and taking off regular lower body clothing?: A Lot 6 Click Score: 13    End of  Session Equipment Utilized During Treatment: Gait belt;Cervical collar  OT Visit Diagnosis: Other abnormalities of gait and mobility (R26.89);History of falling (Z91.81);Muscle weakness (generalized) (M62.81);Pain   Activity Tolerance Patient tolerated treatment well;Patient limited by pain   Patient Left in bed;with call bell/phone within reach;with bed alarm set   Nurse Communication Mobility status;Other (comment) (small area of skin breakdown in gluteal fold - RN and NT present in room, mepilex applied)        Time: 1102-1130 OT Time Calculation (min): 28 min  Charges: OT General Charges $OT Visit: 1 Visit OT Treatments $Self Care/Home Management : 8-22 mins  [facility] MS, OTR/L ascom [phone]  10/06/22, 2:10 PM

## 2022-10-07 ENCOUNTER — Inpatient Hospital Stay: Payer: Medicare Other

## 2022-10-07 DIAGNOSIS — S12101A Unspecified nondisplaced fracture of second cervical vertebra, initial encounter for closed fracture: Secondary | ICD-10-CM | POA: Diagnosis not present

## 2022-10-07 DIAGNOSIS — R509 Fever, unspecified: Secondary | ICD-10-CM | POA: Diagnosis not present

## 2022-10-07 DIAGNOSIS — M25552 Pain in left hip: Secondary | ICD-10-CM

## 2022-10-07 DIAGNOSIS — W19XXXA Unspecified fall, initial encounter: Secondary | ICD-10-CM | POA: Diagnosis not present

## 2022-10-07 DIAGNOSIS — S12191A Other nondisplaced fracture of second cervical vertebra, initial encounter for closed fracture: Secondary | ICD-10-CM

## 2022-10-07 DIAGNOSIS — S14109A Unspecified injury at unspecified level of cervical spinal cord, initial encounter: Secondary | ICD-10-CM | POA: Diagnosis not present

## 2022-10-07 DIAGNOSIS — C221 Intrahepatic bile duct carcinoma: Secondary | ICD-10-CM

## 2022-10-07 DIAGNOSIS — S0990XA Unspecified injury of head, initial encounter: Secondary | ICD-10-CM

## 2022-10-07 LAB — GLUCOSE, CAPILLARY
Glucose-Capillary: 137 mg/dL — ABNORMAL HIGH (ref 70–99)
Glucose-Capillary: 140 mg/dL — ABNORMAL HIGH (ref 70–99)
Glucose-Capillary: 147 mg/dL — ABNORMAL HIGH (ref 70–99)
Glucose-Capillary: 150 mg/dL — ABNORMAL HIGH (ref 70–99)
Glucose-Capillary: 175 mg/dL — ABNORMAL HIGH (ref 70–99)
Glucose-Capillary: 187 mg/dL — ABNORMAL HIGH (ref 70–99)

## 2022-10-07 LAB — CBC
HCT: 30.8 % — ABNORMAL LOW (ref 36.0–46.0)
Hemoglobin: 9.7 g/dL — ABNORMAL LOW (ref 12.0–15.0)
MCH: 31.5 pg (ref 26.0–34.0)
MCHC: 31.5 g/dL (ref 30.0–36.0)
MCV: 100 fL (ref 80.0–100.0)
Platelets: 271 10*3/uL (ref 150–400)
RBC: 3.08 MIL/uL — ABNORMAL LOW (ref 3.87–5.11)
RDW: 19.9 % — ABNORMAL HIGH (ref 11.5–15.5)
WBC: 13.4 10*3/uL — ABNORMAL HIGH (ref 4.0–10.5)
nRBC: 0 % (ref 0.0–0.2)

## 2022-10-07 LAB — URINALYSIS, COMPLETE (UACMP) WITH MICROSCOPIC
Bilirubin Urine: NEGATIVE
Glucose, UA: NEGATIVE mg/dL
Hgb urine dipstick: NEGATIVE
Ketones, ur: NEGATIVE mg/dL
Nitrite: NEGATIVE
Protein, ur: 30 mg/dL — AB
Specific Gravity, Urine: 1.015 (ref 1.005–1.030)
WBC, UA: >50 WBC/hpf — ABNORMAL HIGH (ref 0–5)
pH: 5 (ref 5.0–8.0)

## 2022-10-07 LAB — BASIC METABOLIC PANEL
Anion gap: 4 — ABNORMAL LOW (ref 5–15)
BUN: 22 mg/dL (ref 8–23)
CO2: 23 mmol/L (ref 22–32)
Calcium: 8.2 mg/dL — ABNORMAL LOW (ref 8.9–10.3)
Chloride: 108 mmol/L (ref 98–111)
Creatinine, Ser: 1.41 mg/dL — ABNORMAL HIGH (ref 0.44–1.00)
GFR, Estimated: 42 mL/min — ABNORMAL LOW (ref >60)
Glucose, Bld: 127 mg/dL — ABNORMAL HIGH (ref 70–99)
Potassium: 4.3 mmol/L (ref 3.5–5.1)
Sodium: 135 mmol/L (ref 135–145)

## 2022-10-07 LAB — PHOSPHORUS: Phosphorus: 3.3 mg/dL (ref 2.5–4.6)

## 2022-10-07 LAB — MAGNESIUM: Magnesium: 2.3 mg/dL (ref 1.7–2.4)

## 2022-10-07 MED ORDER — LACTATED RINGERS IV SOLN: INTRAVENOUS | Status: AC

## 2022-10-07 NOTE — Progress Notes (Addendum)
Inpatient Rehabilitation Admissions Coordinator   I spoke with patient and her daughter who has arrived to her bedside today. Patient feeling pressured to make decisions today about moving to CIR at [facility] in [place]. We will let her progress with better pain control today and then proceed to admit in next 24 to 48 hrs. Acute team and TOC made aware.  Danne Baxter, RN, MSN Rehab Admissions Coordinator [phone] 10/07/2022 10:46 AM

## 2022-10-07 NOTE — Assessment & Plan Note (Signed)
See above

## 2022-10-07 NOTE — Assessment & Plan Note (Signed)
-   Outpatient follow-up with oncology

## 2022-10-07 NOTE — Progress Notes (Signed)
OT Cancellation Note  Patient Details Name: Janice Short MRN: [id number] DOB: [birth year]   Cancelled Treatment:    Reason Eval/Treat Not Completed: Pain limiting ability to participate. PT/OT attempting to see pt for co-treatment. Pt reporting significant pain in LUE and IV site on R wrist. Pt politely declining to work with therapy at this time. Agreeable to work with therapy this afternoon. RN notified pt requesting pain meds. Will re-attempt as able.   Doneta Public 10/07/2022, 9:40 AM

## 2022-10-07 NOTE — Progress Notes (Signed)
  Chaplain On-Call responded to [place] Order for prayer from Luvenia Heller, RN.  Chaplain met patient with her daughter and grandson at bedside.  Offered supportive listening as patient described her recent fall and injuries which brought her to the hospital. She also discussed waiting to be discharged today to a [employer].  Chaplain provided spiritual and emotional support and prayer.  Chaplain Pollyann Samples M.Div., [facility]

## 2022-10-07 NOTE — Hospital Course (Signed)
Taken from prior notes.  This 61 years old female with PMH significant for IDDM, chronic bronchitis, intrahepatic cholangiocarcinoma on chemotherapy, last infusion on 09/18/2022 presented in the ED s/p accidental fall.  She was unable to stand up.  She sustained a laceration on the left cheek and complained of left hip pain and bilateral upper extremity numbness since the fall.  Imaging in the ED shows nondisplaced fracture of anterior inferior body of C2.  No epidural hematoma.  Multilevel degenerative changes most prominent at C4 and C5. Patient was placed on cervical collar. ED physician has spoken with neurosurgeon Dr. Cari Caraway who recommended ACDF for acute spinal contusion at C4-C5 due to severe spinal stenosis.   Patient underwent ACDF with neurosurgery of C4-C5 on 10/02/2022. PT was recommending CIR-pending insurance  11/1: Patient had insurance approval today.  She was refusing to go today stating that she will leave tomorrow morning.  CIR will notify as about any bed availability within next 24 to 48 hours. Patient developed fever at 101.9 in the afternoon.  No other symptoms. Message sent to neurosurgery.  Blood cultures ordered.  11/2: Patient was afebrile this morning.  Labs with slight worsening of leukocytosis at 14, decreasing hemoglobin to 8.5 and rising creatinine at 1.50.  UA with large leukocytes, blood cultures pending.  Chest x-ray normal.  Lower extremity venous Doppler with no DVT. Sending urine culture and starting her on ceftriaxone and LR.  No contrast exposure during current hospitalization. Patient had a bed at CIR and they can manage her UTI.  She received a dose of ceftriaxone and discharged on Keflex.  CIR can follow up on urine culture results and change antibiotics as needed. Patient received some IV fluid.  Need to encourage for p.o. hydration. She can get some IV fluid if needed at CIR. Patient need monitoring of renal function as appears dry.  She will continue  on current medications.  Patient need to follow-up with primary care provider and neurosurgery for further recommendations.

## 2022-10-07 NOTE — Assessment & Plan Note (Signed)
Patient developed fever in the late afternoon.  No other associated symptoms.  No cough or congestion.  Incision site without any obvious infection.  CBC with improvement of leukocytosis this morning. -Check UA and blood culture -Message sent to neurosurgery. -Continue to monitor -Supportive care

## 2022-10-07 NOTE — Progress Notes (Signed)
Referring Physician:  No referring provider defined for this encounter.  Primary Physician:  Steele Sizer, MD   10/07/2022 Febrile today  10/06/22 No acute issues overnight  History of Present Illness:  [name] is here today with a chief complaint of mechanical fall yesterday.  She struck her face on a rail, and had immediate onset of change in her strength in her upper extremities.  She also had difficulty moving her left hand and right hand.  She lost strength in her left arm.  She was unable to get herself up from her fall, and was then brought in for evaluation. During evaluation, she was noted to have C2 fracture as well as severe stenosis at C4-5 with myelomalacia.  DOS: 10/02/22 - C4-5 ACDF  Interval History: Janice Short went to surgery yesterday for a C4-5 ACDF.  She has been able to tolerate some p.o. intake but her appetite is low.  She has some mild soreness with her throat but is able to swallow.  She has not noticed any swelling in her incision.  She does feel that she is improving slowly.  Review of Systems:  A 10 point review of systems is negative, except for the pertinent positives and negatives detailed in the HPI.  Past Medical History: Past Medical History:  Diagnosis Date   Allergy    Asthma    Carpal tunnel syndrome on left    Cervical radiculitis    Chronic osteoarthritis    Corns and callosity    Depression    Dermatophytosis of foot    Diabetes mellitus without complication (HCC)    Gout    Hyperlipidemia    Hypertension    Impingement syndrome of left shoulder    Intrahepatic cholangiocarcinoma (HCC)    Lumbosacral neuritis    Microalbuminuria    Obesity    Supraventricular tachycardia 07/01/2015   SVT (supraventricular tachycardia)    Tenosynovitis of wrist    Vitamin D deficiency     Past Surgical History: Past Surgical History:  Procedure Laterality Date   ABDOMINAL HYSTERECTOMY     ANTERIOR CERVICAL DECOMP/DISCECTOMY FUSION  N/A 10/02/2022   Procedure: ANTERIOR CERVICAL DECOMPRESSION/DISCECTOMY FUSION 1 LEVEL;  Surgeon: Meade Maw, MD;  Location: [facility] ORS;  Service: Neurosurgery;  Laterality: N/A;  ACDF C4-5.   COLONOSCOPY WITH PROPOFOL N/A 05/06/2021   Procedure: COLONOSCOPY WITH PROPOFOL;  Surgeon: Lucilla Lame, MD;  Location: [facility] ENDOSCOPY;  Service: Endoscopy;  Laterality: N/A;   ESOPHAGOGASTRODUODENOSCOPY (EGD) WITH PROPOFOL N/A 05/06/2021   Procedure: ESOPHAGOGASTRODUODENOSCOPY (EGD) WITH PROPOFOL;  Surgeon: Lucilla Lame, MD;  Location: [facility] ENDOSCOPY;  Service: Endoscopy;  Laterality: N/A;   PORTA CATH INSERTION N/A 07/17/2021   Procedure: PORTA CATH INSERTION;  Surgeon: Algernon Huxley, MD;  Location: [place] CV LAB;  Service: Cardiovascular;  Laterality: N/A;    Allergies: Allergies as of 10/01/2022 - Review Complete 10/01/2022  Allergen Reaction Noted   Other Shortness Of Breath 09/01/2016   Cisplatin Itching 11/10/2021   Bydureon [exenatide] Other (See Comments) 06/18/2017    Medications: Current Meds  Medication Sig   albuterol (VENTOLIN HFA) 108 (90 Base) MCG/ACT inhaler INHALE TWO PUFFS BY MOUTH EVERY 6 HOURS AS NEEDED FOR WHEEZING AND FOR SHORTNESS OF BREATH (BULK)   aspirin 81 MG tablet Take 1 tablet by mouth daily.   atorvastatin (LIPITOR) 40 MG tablet Take 1 tablet (40 mg total) by mouth daily.   blood glucose meter kit and supplies Dispense based on patient and insurance preference.  Use up to four times daily as directed. (FOR ICD-10 E10.9, E11.9).   diphenhydramine-acetaminophen (TYLENOL PM) 25-500 MG TABS tablet Take 1 tablet by mouth at bedtime as needed.   escitalopram (LEXAPRO) 20 MG tablet TAKE ONE TABLET BY MOUTH DAILY AT 9AM   fluticasone (FLONASE) 50 MCG/ACT nasal spray Place 2 sprays into both nostrils daily as needed. USE 2 SPRAY(S) IN EACH NOSTRIL AT BEDTIME   Fluticasone-Umeclidin-Vilant (TRELEGY ELLIPTA) 100-62.5-25 MCG/ACT AEPB Inhale 1 puff into the lungs daily.  (Patient taking differently: Inhale 1 puff into the lungs daily.)   gabapentin (NEURONTIN) 300 MG capsule TAKE TWO CAPSULES BY MOUTH TWICE DAILY @ 9AM & 5PM   insulin degludec (TRESIBA FLEXTOUCH) 100 UNIT/ML FlexTouch Pen Inject 20 Units into the skin daily.   lidocaine-prilocaine (EMLA) cream Apply 1 Application topically as needed. Apply to port and cover with saran wrap 1-2 hours prior to port access    Social History: Social History   Tobacco Use   Smoking status: Some Days    Types: Cigarettes   Smokeless tobacco: Never   Tobacco comments:    1 per day 01/30/21 does not inhale it  Vaping Use   Vaping Use: Never used  Substance Use Topics   Alcohol use: Not Currently   Drug use: Not Currently    Types: Benzodiazepines    Family Medical History: Family History  Problem Relation Age of Onset   Heart attack Mother    CAD Mother    Kidney disease Father     Physical Examination: Vitals:   10/07/22 1601 10/07/22 1619  BP: 122/65   Pulse: (!) 103   Resp: 16   Temp: (!) 101.1 F (38.4 C) (!) 101.2 F (38.4 C)  SpO2: 97%     General: Patient is calm collected, and in no apparent distress. Attention to examination is appropriate.  Neck: Incision clean dry and flat.   Respiratory: Patient is breathing without any difficulty.  NEUROLOGICAL:     Awake, alert, oriented to person, place.  Speech is clear and fluent. Fund of knowledge is appropriate.  Laceration on R cheek sewn together.    Side Biceps Triceps Deltoid Interossei Grip Wrist Ext. Wrist Flex.  R _0 4- 4- 4-  L 4- _1 Side Iliopsoas Quads Hamstring PF DF EHL  R _2 L _3 Medical Decision Making  Imaging: MRI C spine 10/01/22 IMPRESSION: 1. Nondisplaced fracture anterior inferior body of C2 not appreciated by MRI. No other fracture. No epidural hematoma. 2. Multilevel degenerative change most prominent at C4-5. Moderate spinal stenosis at C4-5 with bilateral  cord hyperintensity compatible with chronic compressive myelopathy.     Electronically Signed   By: Franchot Gallo M.D.   On: 10/01/2022 20:17  CT C spine 10/01/22 IMPRESSION: 1. No CT evidence for acute intracranial abnormality. Patchy white matter hypodensity likely chronic small vessel ischemic change. 2. Findings consistent with nondisplaced teardrop type fracture involving the anterior inferior aspect of the C2 vertebral body. 3. Multilevel moderate severe degenerative changes   Critical Value/emergent results were called by telephone at the time of interpretation on 10/01/2022 at 5:56 pm to provider [facility] , who verbally acknowledged these results.     Electronically Signed   By: Donavan Foil M.D.   On: 10/01/2022 17:56  I have personally reviewed the images and agree with the above  interpretation.  Assessment and Plan: Janice Short is a pleasant 61 y.o. female with C2 fracture that appears to be stable.  She has an incomplete spinal cord injury consistent with a central cord injury.    Continue PTOT Consider dispo planning Unlikely to be infected anterior cervical - would recommend fever workup per protocol.  Consider UTI, DVT, PNA, atelectasis, pressure sore as cause  Meade Maw MD Neurosurgery

## 2022-10-07 NOTE — Progress Notes (Signed)
Inpatient Rehabilitation Admissions Coordinator   I have insurance approval and CIR bed available to admit her to today. I spoke with her daughter and patient separately by phone. Daughter is to visit today with patient and patient remains undecided if she will agree to [facility] in [place]. Daughter will call me when decision made. Acute team and TOC made aware.  Danne Baxter, RN, MSN Rehab Admissions Coordinator [phone] 10/07/2022 8:49 AM

## 2022-10-07 NOTE — Progress Notes (Addendum)
Physical Therapy Treatment Patient Details Name: Janice Short MRN: [id number] DOB: 1961/10/18 Today's Date: 10/07/2022   History of Present Illness Pt is a 61 y.o. female presenting to ED 10/26 s/p fall (head was hit on bed rail during fall).  Large laceration to face and shortened and externally rotate hip noted by EMS.  Imaging showing nondisplaced teardrop type fracture involving the anterior inferior aspect of the C2 vertebral body.  Pt admitted with closed fx of C2 vertebra; contusion of cervical cord C4-5 d/t accidental fall, L hip pain (x-ray hip showing no fx), laceration L cheek, hyperkalemia, and simple chronic bronchitis.  S/p ACDF C4-5 10/02/22.  PMH includes spinal stenosis, DM, metastatic intrahepatic cholangiocarcinoma on chemotherapy, htn, HLD, CTS.    PT Comments    Pt was long sitting in bed upon arriving. PT/OT co-treat 2/2 to need +2 assistance and complexity of pt's situation. Pt was in pain first attempt but was agreeable to try OOB this session. Continues to endorse pain however did not limit session progression. Attempted to stand 2 x from elevated bed height with +2 assistance. Unable to safely clear hips. Session did progress to lateral transfer to/from bed<><recliner. Had pt transfer towards the right both times with recliner armrest removed.She did much better with bed > chair then chair >bed. CIR is still most appropriate DC disposition.   Recommendations for follow up therapy are one component of a multi-disciplinary discharge planning process, led by the attending physician.  Recommendations may be updated based on patient status, additional functional criteria and insurance authorization.  Follow Up Recommendations  Acute inpatient rehab (3hours/day)     Assistance Recommended at Discharge Frequent or constant Supervision/Assistance  Patient can return home with the following Two people to help with walking and/or transfers;Two people to help with  bathing/dressing/bathroom;Assistance with cooking/housework;Assistance with feeding;Direct supervision/assist for medications management;Assist for transportation;Help with stairs or ramp for entrance   Equipment Recommendations  Other (comment) (defer to next level of care)       Precautions / Restrictions Precautions Precautions: Cervical;Fall Precaution Booklet Issued: No Precaution Comments: Only needs brace when up and out of bed; may remove brace when in bed; apply/remove brace while sitting; may remove brace to shower; may ambulate to bathroom without brace; MAP >80 Required Braces or Orthoses: Cervical Brace Cervical Brace: Hard collar Restrictions Weight Bearing Restrictions: No     Mobility  Bed Mobility Overal bed mobility: Needs Assistance Bed Mobility: Sit to Supine, Sidelying to Sit   Sidelying to sit: +2 for physical assistance, Max assist Supine to sit: Max assist, +2 for safety/equipment Sit to supine: Max assist, +2 for physical assistance   Transfers Overall transfer level: Needs assistance Equipment used: Rolling walker (2 wheels) Transfers: Sit to/from Stand Sit to Stand: Max assist, +2 physical assistance, From elevated surface, +2 safety/equipment       Lateral/Scoot Transfers: Mod assist, +2 physical assistance, +2 safety/equipment, From elevated surface General transfer comment: Attempted STS 2x with RW and B knee blocking, however, unable to clear hips off bed. Completed lateral/scoot transfer from EOB>recliner with Mod A +2, Max A +1 to go from recliner>EOB.    Ambulation/Gait  General Gait Details: currently unable   Balance Overall balance assessment: Needs assistance Sitting-balance support: Feet supported, Bilateral upper extremity supported Sitting balance-Leahy Scale: Fair     Standing balance support: Bilateral upper extremity supported Standing balance-Leahy Scale: Zero Standing balance comment: pt unable to fully stand erect or clear  hips     Cognition  Arousal/Alertness: Awake/alert Behavior During Therapy: WFL for tasks assessed/performed Overall Cognitive Status: Within Functional Limits for tasks assessed    General Comments: Pt is A and O but does voice anxiety about going to [facility] for rehab        Exercises Other Exercises Other Exercises: Pt was educated on what to expect at CIR.        Pertinent Vitals/Pain Pain Assessment Pain Assessment: Faces Faces Pain Scale: Hurts worst Pain Location: BLEs, low back, neck Pain Descriptors / Indicators: Constant, Discomfort, Grimacing, Guarding, Sharp, Crying Pain Intervention(s): Limited activity within patient's tolerance, Monitored during session, Premedicated before session, Repositioned     PT Goals (current goals can now be found in the care plan section) Acute Rehab PT Goals Patient Stated Goal: rehab then home Progress towards PT goals: Progressing toward goals    Frequency    7X/week      PT Plan Current plan remains appropriate    Co-evaluation   Reason for Co-Treatment: Complexity of the patient's impairments (multi-system involvement) PT goals addressed during session: Mobility/safety with mobility;Balance;Proper use of DME;Strengthening/ROM OT goals addressed during session: ADL's and self-care;Strengthening/ROM      AM-PAC PT "6 Clicks" Mobility   Outcome Measure  Help needed turning from your back to your side while in a flat bed without using bedrails?: A Lot Help needed moving from lying on your back to sitting on the side of a flat bed without using bedrails?: Total Help needed moving to and from a bed to a chair (including a wheelchair)?: Total Help needed standing up from a chair using your arms (e.g., wheelchair or bedside chair)?: Total Help needed to walk in hospital room?: Total Help needed climbing 3-5 steps with a railing? : Total 6 Click Score: 7    End of Session Equipment Utilized During Treatment: Gait  belt;Cervical collar Activity Tolerance: Patient limited by pain Patient left: in bed;with call bell/phone within reach;with bed alarm set Nurse Communication: Mobility status;Precautions;Other (comment) PT Visit Diagnosis: Other abnormalities of gait and mobility (R26.89);Muscle weakness (generalized) (M62.81);History of falling (Z91.81);Pain Pain - Right/Left: Right Pain - part of body: Knee     Time: 3532-9924 PT Time Calculation (min) (ACUTE ONLY): 37 min  Charges:  $Therapeutic Activity: 8-22 mins          Julaine Fusi PTA 10/07/22, 3:56 PM

## 2022-10-07 NOTE — Progress Notes (Signed)
  Progress Note   Patient: Janice Short ZDG:[id number] DOB: 12/28/1960 DOA: 10/01/2022     6 DOS: the patient was seen and examined on 10/07/2022   Brief hospital course: No notes on file  Assessment and Plan: * Closed fracture of C2 vertebra (HCC) Contusion of cervical cord C4-5 secondary to accidental fall Discussed with Dr. Cari Caraway and she was taken to the OR for ACDF on 10/27. Remained stable since procedure. PT/OT are recommending CIR-now had insurance authorization. -Continue with pain management  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[place]) - See above  Fever Patient developed fever in the late afternoon.  No other associated symptoms.  No cough or congestion.  Incision site without any obvious infection.  CBC with improvement of leukocytosis this morning. -Check UA and blood culture -Message sent to neurosurgery. -Continue to monitor -Supportive care  Left hip pain Secondary to accidental fall X-ray hip showing no fracture Pain control  Laceration of cheek without complication, left, initial encounter Sutured in the ED Wound care  Uncontrolled type 2 diabetes mellitus with hyperglycemia, with long-term current use of insulin (HCC) Sliding scale insulin coverage Basal insulin  Hypertension Blood pressure within goal. -Continue low-dose amlodipine  Intrahepatic cholangiocarcinoma (HCC) - Outpatient follow-up with oncology  Stage 3a chronic kidney disease (HCC) AKI with history of stage IIIa.  Creatinine currently stable at 1.41  up from baseline of 1.17.   -We will give another liter of fluid -Monitor renal function  Hyperkalemia Resolved with [place]. -Continue to monitor  Simple chronic bronchitis ([place]) DuoNebs as needed Trelegy Ellipta      Subjective: Patient was seen and examined today.  She was covering herself fully with blankets stating that she is feeling cold.  When told that she has insurance authorization for CIR today, stated that she is  not ready to move anywhere today and will go tomorrow.  Physical Exam: Vitals:   10/07/22 0505 10/07/22 0747 10/07/22 1601 10/07/22 1619  BP:  125/64 122/65   Pulse:  80 (!) 103   Resp:  18 16   Temp:  98 F (36.7 C) (!) 101.1 F (38.4 C) (!) 101.2 F (38.4 C)  TempSrc:  Oral Oral Axillary  SpO2:  100% 97%   Weight: 75.3 kg     Height:       General.  Well-developed lady, in no acute distress. Pulmonary.  Lungs clear bilaterally, normal respiratory effort. CV.  Regular rate and rhythm, no JVD, rub or murmur. Abdomen.  Soft, nontender, nondistended, BS positive. CNS.  Alert and oriented .  No focal neurologic deficit. Extremities.  No edema, no cyanosis, pulses intact and symmetrical. Psychiatry.  Appears to have some underlying psych issues.  Data Reviewed: Prior data reviewed  Family Communication:   Disposition: Status is: Inpatient Remains inpatient appropriate because: Severity of illness   Planned Discharge Destination: Rehab  DVT prophylaxis.  Lovenox Time spent: 50 minutes  This record has been created using Systems analyst. Errors have been sought and corrected,but may not always be located. Such creation errors do not reflect on the standard of care.  Author: Lorella Nimrod, MD 10/07/2022 4:40 PM  For on call review [email or web address].

## 2022-10-07 NOTE — Progress Notes (Signed)
PT Cancellation Note  Patient Details Name: Janice Short MRN: [id number] DOB: 1961/04/21   Cancelled Treatment:     PT attempt. 2nd attempt today. Pt requesting pain meds first attempt. Second attempt, pt with chaplin. Will return shortly and continue to follow per current POC.    Willette Pa 10/07/2022, 10:10 AM

## 2022-10-07 NOTE — Progress Notes (Signed)
Inpatient Rehabilitation Admissions Coordinator   Noted fever work up. Will follow.  Danne Baxter, RN, MSN Rehab Admissions Coordinator [phone] 10/07/2022 7:41 PM

## 2022-10-07 NOTE — H&P (Signed)
Physical Medicine and Rehabilitation Admission H&P    Chief Complaint  Patient presents with   Fall    Head was hit on bed rail during fall  : HPI: [place]. Janice Short is a 61 year old right-handed female with history of diabetes mellitus, CKD stage III, chronic anemia, chronic bronchitis, intrahepatic cholangiocarcinoma on chemotherapy last infused 09/18/2022 followed by [name], hypertension, hyperlipidemia, gout, obesity with BMI 27.62, tobacco use, carpal tunnel syndrome on left.  Per chart review patient lives with daughter and 61-year-old grandson.  Mobile home with ramped entrance.  Reportedly independent prior to admission.  Presented to [facility] 10/01/2022 following mechanical fall when she tripped, fell backwards striking her face on a railing.  She was unable to help herself up.  She sustained a laceration to the left cheek and complained of left hip pain and bilateral upper extremity numbness.  Admission chemistries unremarkable except potassium 5.7, creatinine 1.30, GFR 46, calcium 7.9, glucose 243, hemoglobin 9.7, WBC 12,200.  Patient's laceration to left cheek was sutured in the ED.  Cranial CT scan negative for intracranial abnormality.  CT maxillofacial negative.  CT left hip negative for fracture.  CT cervical spine findings consistent with nondisplaced teardrop type fracture involving the anterior inferior aspect of the C2 vertebral body as well as severe stenosis C4-5 with myelomalacia.  Neurosurgery consulted underwent anterior cervical discectomy decompression and fusion/cervical instrumentation C4-5 10/02/2022 per Dr. Izora Ribas.  Hard cervical collar when out of bed and can be applied in the sitting position.  Hospital course AKI on CKD stage III baseline serum creatinine 1.18 > 1.26.  Noted hyperkalemia 5.7 responded well to [facility] with latest potassium 4.2 and creatinine 1.50.  Leukocytosis 21,600 improved to 14,000.  Acute on chronic anemia latest hemoglobin 9.6.  She was cleared  to begin Lovenox for DVT prophylaxis.  Spiked a low-grade fever 101.1 10/07/2022 with venous Doppler studies negative, chest x-ray showed no acute disease, urinalysis negative nitrite WBC improved at 14,000 down from 17,200.Marland KitchenPlaced empirically on Rocephin 10/08/2022 changed to Keflex for 7 days- changed since likely has neurogenic bladder.   Therapy evaluations completed due to patient decreased functional mobility was admitted for a comprehensive rehab program.  Pt reports was using she thinks a purewick, and thinks can pee, but doesn't have "full control" of urine- hasn't been receiving bladder scans to see if emptying. Also hasn't had a BM for 8 days, the night before admission.   Has a very sore bum- due to pressure ulcer and says it "stinks".  Pain is horrible- mainly low back- doesn't know when last had pain meds- but was due when got here, per nurse.   Also c/o muscle spasms in RLE- as well as shivering due to feeling really cold.    Review of Systems  Constitutional:  Negative for chills and fever.  HENT:  Negative for hearing loss.   Eyes:  Negative for blurred vision and double vision.  Respiratory:  Negative for cough and shortness of breath.   Cardiovascular:  Negative for chest pain and palpitations.  Gastrointestinal:  Positive for constipation. Negative for heartburn, nausea and vomiting.  Genitourinary:  Positive for frequency and urgency. Negative for dysuria, flank pain and hematuria.  Musculoskeletal:  Positive for falls, joint pain and myalgias.  Skin:  Negative for rash.  Psychiatric/Behavioral:  Positive for depression.   All other systems reviewed and are negative.  Past Medical History:  Diagnosis Date   Allergy    Asthma    Carpal tunnel syndrome  on left    Cervical radiculitis    Chronic osteoarthritis    Corns and callosity    Depression    Dermatophytosis of foot    Diabetes mellitus without complication (HCC)    Gout    Hyperlipidemia    Hypertension     Impingement syndrome of left shoulder    Intrahepatic cholangiocarcinoma (HCC)    Lumbosacral neuritis    Microalbuminuria    Obesity    Supraventricular tachycardia 07/01/2015   SVT (supraventricular tachycardia)    Tenosynovitis of wrist    Vitamin D deficiency    Past Surgical History:  Procedure Laterality Date   ABDOMINAL HYSTERECTOMY     ANTERIOR CERVICAL DECOMP/DISCECTOMY FUSION N/A 10/02/2022   Procedure: ANTERIOR CERVICAL DECOMPRESSION/DISCECTOMY FUSION 1 LEVEL;  Surgeon: Meade Maw, MD;  Location: [facility] ORS;  Service: Neurosurgery;  Laterality: N/A;  ACDF C4-5.   COLONOSCOPY WITH PROPOFOL N/A 05/06/2021   Procedure: COLONOSCOPY WITH PROPOFOL;  Surgeon: Lucilla Lame, MD;  Location: [facility] ENDOSCOPY;  Service: Endoscopy;  Laterality: N/A;   ESOPHAGOGASTRODUODENOSCOPY (EGD) WITH PROPOFOL N/A 05/06/2021   Procedure: ESOPHAGOGASTRODUODENOSCOPY (EGD) WITH PROPOFOL;  Surgeon: Lucilla Lame, MD;  Location: [facility] ENDOSCOPY;  Service: Endoscopy;  Laterality: N/A;   PORTA CATH INSERTION N/A 07/17/2021   Procedure: PORTA CATH INSERTION;  Surgeon: Algernon Huxley, MD;  Location: [facility] LAB;  Service: Cardiovascular;  Laterality: N/A;   Family History  Problem Relation Age of Onset   Heart attack Mother    CAD Mother    Kidney disease Father    Social History:  reports that she has been smoking cigarettes. She has never used smokeless tobacco. She reports that she does not currently use alcohol. She reports that she does not currently use drugs after having used the following drugs: Benzodiazepines. Allergies:  Allergies  Allergen Reactions   Other Shortness Of Breath    Cat dander and grass pollen   Cisplatin Itching   Bydureon [Exenatide] Other (See Comments)    Pain with injection not allergy   Medications Prior to Admission  Medication Sig Dispense Refill   albuterol (VENTOLIN HFA) 108 (90 Base) MCG/ACT inhaler INHALE TWO PUFFS BY MOUTH EVERY 6 HOURS AS NEEDED FOR  WHEEZING AND FOR SHORTNESS OF BREATH (BULK) 18 g 0   aspirin 81 MG tablet Take 1 tablet by mouth daily.     atorvastatin (LIPITOR) 40 MG tablet Take 1 tablet (40 mg total) by mouth daily. 90 tablet 1   blood glucose meter kit and supplies Dispense based on patient and insurance preference. Use up to four times daily as directed. (FOR ICD-10 E10.9, E11.9). 1 each 0   diphenhydramine-acetaminophen (TYLENOL PM) 25-500 MG TABS tablet Take 1 tablet by mouth at bedtime as needed.     escitalopram (LEXAPRO) 20 MG tablet TAKE ONE TABLET BY MOUTH DAILY AT 9AM 90 tablet 1   fluticasone (FLONASE) 50 MCG/ACT nasal spray Place 2 sprays into both nostrils daily as needed. USE 2 SPRAY(S) IN EACH NOSTRIL AT BEDTIME 16 g 0   Fluticasone-Umeclidin-Vilant (TRELEGY ELLIPTA) 100-62.5-25 MCG/ACT AEPB Inhale 1 puff into the lungs daily. (Patient taking differently: Inhale 1 puff into the lungs daily.) 3 each 1   gabapentin (NEURONTIN) 300 MG capsule TAKE TWO CAPSULES BY MOUTH TWICE DAILY @ 9AM & 5PM 360 capsule 1   insulin degludec (TRESIBA FLEXTOUCH) 100 UNIT/ML FlexTouch Pen Inject 20 Units into the skin daily. 15 mL 1   lidocaine-prilocaine (EMLA) cream Apply 1 Application topically as needed.  Apply to port and cover with saran wrap 1-2 hours prior to port access 30 g 3   Insulin Pen Needle 32G X 6 MM MISC 1 each by Does not apply route daily at 12 noon. 100 each 1   ondansetron (ZOFRAN) 4 MG tablet Take 1 tablet (4 mg total) by mouth every 8 (eight) hours as needed. (Patient not taking: Reported on 10/01/2022) 20 tablet 0      Home: [place] expects to be discharged to:: Private residence Living Arrangements:  (Lives with adult daughter, Mendel Ryder and pt's 42 year old grandson) Available Help at Discharge: Family, Available 24 hours/day (daughter works remote from home) Type of Home: Mobile home Home Access: Ramped entrance Home Layout: One level Bathroom Shower/Tub: Print production planner: Standard Bathroom Accessibility: Yes Home Equipment: BSC/3in1, Radio producer - single point  Lives With: Daughter   Functional History: Prior Function Prior Level of Function : Independent/Modified Independent, History of Falls (last six months) Mobility Comments: Independent with ambulation; no other recent falls ADLs Comments: Independent  Functional Status:  Mobility: Bed Mobility Overal bed mobility: Needs Assistance Bed Mobility: Sit to Supine, Sidelying to Sit Rolling: Mod assist Sidelying to sit: +2 for physical assistance, Max assist Supine to sit: Max assist, +2 for safety/equipment Sit to supine: Max assist, +2 for physical assistance General bed mobility comments: assist for trunk and BLEs; assist for log rolling technique; Max A +1 to scoot hips forward/backward at EOB Transfers Overall transfer level: Needs assistance Equipment used: Rolling walker (2 wheels) Transfers: Sit to/from Stand Sit to Stand: Max assist, +2 physical assistance, From elevated surface, +2 safety/equipment Bed to/from chair/wheelchair/BSC transfer type:: Lateral/scoot transfer  Lateral/Scoot Transfers: Mod assist, +2 physical assistance, +2 safety/equipment, From elevated surface General transfer comment: Attempted STS 2x with RW and B knee blocking, however, unable to clear hips off bed. Completed lateral/scoot transfer from EOB>recliner with Mod A +2, Max A +1 to go from recliner>EOB. Ambulation/Gait General Gait Details: currently unable    ADL: ADL Overall ADL's : Needs assistance/impaired Eating/Feeding: Set up, Bed level Eating/Feeding Details (indicate cue type and reason): Pt completing self-feeding with R hand, some difficulty accessing items on tray, attempted to set up pt for optimal positioning Grooming: Moderate assistance, Wash/dry face, Sitting Upper Body Dressing : Minimal assistance, Sitting, Bed level Upper Body Dressing Details (indicate cue type and reason): to don/doff C  collar Lower Body Dressing: Maximal assistance, Bed level Lower Body Dressing Details (indicate cue type and reason): to don socks Toileting- Clothing Manipulation and Hygiene: Maximal assistance, Bed level  Cognition: Cognition Overall Cognitive Status: Within Functional Limits for tasks assessed Orientation Level: Oriented X4 Cognition Arousal/Alertness: Awake/alert Behavior During Therapy: WFL for tasks assessed/performed Overall Cognitive Status: Within Functional Limits for tasks assessed General Comments: Pt is A and O but does voice anxiety about going to [place] for rehab  Physical Exam: Blood pressure (!) 118/58, pulse 100, temperature 99.1 F (37.3 C), resp. rate 18, height _0  (1.702 m), weight 75.3 kg, SpO2 99 %. Physical Exam Vitals and nursing note reviewed.  Constitutional:      Appearance: She is normal weight.     Comments: Awake, alert, shivering even with 3 blankets on her- also feels like 3 blankets too heavy- supine in bed; 2 RN's in room and NT, No acute distress  HENT:     Head: Normocephalic.     Comments: R cheek sutured with ~ 5-6 stitches- well approximated and starting to heal  Right Ear: External ear normal.     Left Ear: External ear normal.     Nose: Nose normal. No congestion.     Mouth/Throat:     Mouth: Mucous membranes are dry.     Pharynx: Oropharynx is clear.  Eyes:     General:        Right eye: No discharge.        Left eye: No discharge.     Extraocular Movements: Extraocular movements intact.  Neck:     Comments: Anterior neck- looks like glued anterior cervical incision Cardiovascular:     Rate and Rhythm: Regular rhythm. Tachycardia present.     Heart sounds: Normal heart sounds. No murmur heard.    No gallop.     Comments: Rate low 100s;  Pulmonary:     Effort: Pulmonary effort is normal. No respiratory distress.     Breath sounds: Normal breath sounds. No wheezing, rhonchi or rales.  Abdominal:     General: There is  distension.     Tenderness: There is no abdominal tenderness. There is no guarding or rebound.     Comments: A little firm; distended; NT; hypoactive BS  Genitourinary:    Comments: Vulva has no rashes/wounds Musculoskeletal:     Comments: RUE- Bicep 4+/5; triceps 4/5; WE 4-/5; grip 4-/5; FA 4-/5 LUE- Biceps 4-/5; WE 3-/5; Triceps 2/5; Grip 2-/5 and FA 1/5 RLE- HF, KE 4/5; DF/PF and EHL 4+/5 LLE- DF/PF 4/5 and EHL 4-/5- pt unable to let me uncover her again, she's so cold to test rest of LLE  Skin:    General: Skin is warm and dry.     Comments: Skin laceration to left facial cheek with stitches in place.  ACDF site clean and dry Stage III- superficial stage III 1.3 x 1.0 x 0.5 cm on coccyx with a few tiny spots that are more superficial around it Port not accessed- R chest Dry skin on feet- some bunions and hammertoes R forearm IV- looks OK  Neurological:     Mental Status: She is alert.     Comments: Patient is alert and oriented x3.  Follows full commands. Ox3 Not able to check sensory exam due to cold today No increased tone or clonus RLE/LLE  Psychiatric:     Comments: Flat, sad affect but very compliant with exam     Results for orders placed or performed during the hospital encounter of 10/01/22 (from the past 48 hour(s))  Glucose, capillary     Status: Abnormal   Collection Time: 10/06/22  7:41 AM  Result Value Ref Range   Glucose-Capillary 171 (H) 70 - 99 mg/dL    Comment: Glucose reference range applies only to samples taken after fasting for at least 8 hours.  CBC     Status: Abnormal   Collection Time: 10/06/22  9:37 AM  Result Value Ref Range   WBC 17.2 (H) 4.0 - 10.5 K/uL   RBC 3.12 (L) 3.87 - 5.11 MIL/uL   Hemoglobin 9.6 (L) 12.0 - 15.0 g/dL   HCT 30.6 (L) 36.0 - 46.0 %   MCV 98.1 80.0 - 100.0 fL   MCH 30.8 26.0 - 34.0 pg   MCHC 31.4 30.0 - 36.0 g/dL   RDW 20.2 (H) 11.5 - 15.5 %   Platelets 272 150 - 400 K/uL   nRBC 0.0 0.0 - 0.2 %    Comment: Performed  at [facility], [address] [place], [place] [address]  Basic metabolic  panel     Status: Abnormal   Collection Time: 10/06/22  9:37 AM  Result Value Ref Range   Sodium 135 135 - 145 mmol/L   Potassium 4.2 3.5 - 5.1 mmol/L   Chloride 108 98 - 111 mmol/L   CO2 23 22 - 32 mmol/L   Glucose, Bld 157 (H) 70 - 99 mg/dL    Comment: Glucose reference range applies only to samples taken after fasting for at least 8 hours.   BUN 21 8 - 23 mg/dL   Creatinine, Ser 1.41 (H) 0.44 - 1.00 mg/dL   Calcium 8.0 (L) 8.9 - 10.3 mg/dL   GFR, Estimated 42 (L) >60 mL/min    Comment: (NOTE) Calculated using the CKD-EPI Creatinine Equation (2021)    Anion gap 4 (L) 5 - 15    Comment: Performed at [facility], [address] [place], [place] [address]  Glucose, capillary     Status: Abnormal   Collection Time: 10/06/22 12:20 PM  Result Value Ref Range   Glucose-Capillary 198 (H) 70 - 99 mg/dL    Comment: Glucose reference range applies only to samples taken after fasting for at least 8 hours.  Glucose, capillary     Status: Abnormal   Collection Time: 10/06/22  3:49 PM  Result Value Ref Range   Glucose-Capillary 154 (H) 70 - 99 mg/dL    Comment: Glucose reference range applies only to samples taken after fasting for at least 8 hours.  Glucose, capillary     Status: Abnormal   Collection Time: 10/06/22 10:07 PM  Result Value Ref Range   Glucose-Capillary 238 (H) 70 - 99 mg/dL    Comment: Glucose reference range applies only to samples taken after fasting for at least 8 hours.  Glucose, capillary     Status: Abnormal   Collection Time: 10/07/22 12:37 AM  Result Value Ref Range   Glucose-Capillary 187 (H) 70 - 99 mg/dL    Comment: Glucose reference range applies only to samples taken after fasting for at least 8 hours.  Glucose, capillary     Status: Abnormal   Collection Time: 10/07/22  3:52 AM  Result Value Ref Range   Glucose-Capillary 147 (H) 70 - 99 mg/dL    Comment:  Glucose reference range applies only to samples taken after fasting for at least 8 hours.  CBC     Status: Abnormal   Collection Time: 10/07/22  7:14 AM  Result Value Ref Range   WBC 13.4 (H) 4.0 - 10.5 K/uL   RBC 3.08 (L) 3.87 - 5.11 MIL/uL   Hemoglobin 9.7 (L) 12.0 - 15.0 g/dL   HCT 30.8 (L) 36.0 - 46.0 %   MCV 100.0 80.0 - 100.0 fL   MCH 31.5 26.0 - 34.0 pg   MCHC 31.5 30.0 - 36.0 g/dL   RDW 19.9 (H) 11.5 - 15.5 %   Platelets 271 150 - 400 K/uL   nRBC 0.0 0.0 - 0.2 %    Comment: Performed at [facility], [address], [place], [place] [address]  Magnesium     Status: None   Collection Time: 10/07/22  7:14 AM  Result Value Ref Range   Magnesium 2.3 1.7 - 2.4 mg/dL    Comment: Performed at [facility], [address] [place], [place] [address]  Basic metabolic panel     Status: Abnormal   Collection Time: 10/07/22  7:14 AM  Result Value Ref Range   Sodium 135 135 - 145 mmol/L  Potassium 4.3 3.5 - 5.1 mmol/L   Chloride 108 98 - 111 mmol/L   CO2 23 22 - 32 mmol/L   Glucose, Bld 127 (H) 70 - 99 mg/dL    Comment: Glucose reference range applies only to samples taken after fasting for at least 8 hours.   BUN 22 8 - 23 mg/dL   Creatinine, Ser 1.41 (H) 0.44 - 1.00 mg/dL   Calcium 8.2 (L) 8.9 - 10.3 mg/dL   GFR, Estimated 42 (L) >60 mL/min    Comment: (NOTE) Calculated using the CKD-EPI Creatinine Equation (2021)    Anion gap 4 (L) 5 - 15    Comment: Performed at [facility], [address] [place], [place] [address]  Phosphorus     Status: None   Collection Time: 10/07/22  7:14 AM  Result Value Ref Range   Phosphorus 3.3 2.5 - 4.6 mg/dL    Comment: Performed at [facility], [address] [place], [place] [address]  Glucose, capillary     Status: Abnormal   Collection Time: 10/07/22  7:28 AM  Result Value Ref Range   Glucose-Capillary 140 (H) 70 - 99 mg/dL    Comment: Glucose reference range applies only to samples taken after  fasting for at least 8 hours.  Glucose, capillary     Status: Abnormal   Collection Time: 10/07/22 11:12 AM  Result Value Ref Range   Glucose-Capillary 150 (H) 70 - 99 mg/dL    Comment: Glucose reference range applies only to samples taken after fasting for at least 8 hours.  Glucose, capillary     Status: Abnormal   Collection Time: 10/07/22  3:57 PM  Result Value Ref Range   Glucose-Capillary 175 (H) 70 - 99 mg/dL    Comment: Glucose reference range applies only to samples taken after fasting for at least 8 hours.  Glucose, capillary     Status: Abnormal   Collection Time: 10/07/22  8:26 PM  Result Value Ref Range   Glucose-Capillary 137 (H) 70 - 99 mg/dL    Comment: Glucose reference range applies only to samples taken after fasting for at least 8 hours.  Urinalysis, Complete w Microscopic Urine, Clean Catch     Status: Abnormal   Collection Time: 10/07/22  9:53 PM  Result Value Ref Range   Color, Urine AMBER (A) YELLOW    Comment: BIOCHEMICALS MAY BE AFFECTED BY COLOR   APPearance HAZY (A) CLEAR   Specific Gravity, Urine 1.015 1.005 - 1.030   pH 5.0 5.0 - 8.0   Glucose, UA NEGATIVE NEGATIVE mg/dL   Hgb urine dipstick NEGATIVE NEGATIVE   Bilirubin Urine NEGATIVE NEGATIVE   Ketones, ur NEGATIVE NEGATIVE mg/dL   Protein, ur 30 (A) NEGATIVE mg/dL   Nitrite NEGATIVE NEGATIVE   Leukocytes,Ua LARGE (A) NEGATIVE   RBC / HPF 6-10 0 - 5 RBC/hpf   WBC, UA >50 (H) 0 - 5 WBC/hpf   Bacteria, UA RARE (A) NONE SEEN   Squamous Epithelial / LPF 0-5 0 - 5   WBC Clumps PRESENT    Mucus PRESENT     Comment: Performed at [facility], [address] [place], [place] [address]  Glucose, capillary     Status: Abnormal   Collection Time: 10/08/22 12:54 AM  Result Value Ref Range   Glucose-Capillary 157 (H) 70 - 99 mg/dL    Comment: Glucose reference range applies only to samples taken after fasting for at least 8 hours.  Glucose, capillary  Status: Abnormal   Collection Time:  10/08/22  3:52 AM  Result Value Ref Range   Glucose-Capillary 133 (H) 70 - 99 mg/dL    Comment: Glucose reference range applies only to samples taken after fasting for at least 8 hours.   US Venous Img Lower Bilateral (DVT)  Result Date: 10/07/2022 CLINICAL DATA:  Injury, fall, fever EXAM: BILATERAL LOWER EXTREMITY VENOUS DOPPLER ULTRASOUND TECHNIQUE: Gray-scale sonography with graded compression, as well as color Doppler and duplex ultrasound were performed to evaluate the lower extremity deep venous systems from the level of the common femoral vein and including the common femoral, femoral, profunda femoral, popliteal and calf veins including the posterior tibial, peroneal and gastrocnemius veins when visible. Spectral Doppler was utilized to evaluate flow at rest and with distal augmentation maneuvers in the common femoral, femoral and popliteal veins. COMPARISON:  None Available. FINDINGS: RIGHT LOWER EXTREMITY Common Femoral Vein: No evidence of thrombus. Normal compressibility, respiratory phasicity and response to augmentation. Saphenofemoral Junction: No evidence of thrombus. Normal compressibility and flow on color Doppler imaging. Profunda Femoral Vein: No evidence of thrombus. Normal compressibility and flow on color Doppler imaging. Femoral Vein: No evidence of thrombus. Normal compressibility, respiratory phasicity and response to augmentation. Popliteal Vein: No evidence of thrombus. Normal compressibility, respiratory phasicity and response to augmentation. Calf Veins: No evidence of thrombus. Normal compressibility and flow on color Doppler imaging. LEFT LOWER EXTREMITY Common Femoral Vein: No evidence of thrombus. Normal compressibility, respiratory phasicity and response to augmentation. Saphenofemoral Junction: No evidence of thrombus. Normal compressibility and flow on color Doppler imaging. Profunda Femoral Vein: No evidence of thrombus. Normal compressibility and flow on color Doppler  imaging. Femoral Vein: No evidence of thrombus. Normal compressibility, respiratory phasicity and response to augmentation. Popliteal Vein: No evidence of thrombus. Normal compressibility, respiratory phasicity and response to augmentation. Calf Veins: No evidence of thrombus. Normal compressibility and flow on color Doppler imaging. IMPRESSION: No evidence of deep venous thrombosis in either lower extremity. Electronically Signed   By: Jerilynn Mages.  Shick M.D.   On: 10/07/2022 18:27   DG Chest 2 View  Result Date: 10/07/2022 CLINICAL DATA:  Fever EXAM: CHEST - 2 VIEW COMPARISON:  10/01/2022 FINDINGS: RIGHT jugular Port-A-Cath with tip projecting over SVC. Normal heart size, mediastinal contours, and pulmonary vascularity. Atherosclerotic calcification aorta. Lungs clear. No infiltrate, pleural effusion, or pneumothorax. Motion artifacts degrade lateral view. Bones demineralized. IMPRESSION: No acute abnormalities. Aortic Atherosclerosis (ICD10-I70.0). Electronically Signed   By: Lavonia Dana M.D.   On: 10/07/2022 17:49      Blood pressure (!) 118/58, pulse 100, temperature 99.1 F (37.3 C), resp. rate 18, height _0  (1.702 m), weight 75.3 kg, SpO2 99 %.  Medical Problem List and Plan: 1. Functional deficits secondary to central cord syndrome/C2 fracture as well as severe stenosis C4-5 with myelomalacia after fall/traumatic- Has C4 ASIA C/D quadriplegia .  Status post ACDF C4-5 10/02/2022 per Dr.Yarbrough.  Cervical collar when out of bed applied in sitting position  -patient may  shower- monitor skin  -ELOS/Goals: 3-4 weeks- min A hopefully 2.  Antithrombotics: -DVT/anticoagulation:  Pharmaceutical: Lovenox.  Venous Doppler studies 10/07/2022 negative  -antiplatelet therapy: N/A 3. Pain Management: Neurontin 300 mg twice daily, Robaxin and oxycodone as needed 4. Mood/Behavior/Sleep: Lexapro 25 mg daily  -antipsychotic agents: N/A 5. Neuropsych/cognition: This patient is capable of making decisions on  her own behalf. 6. Skin/Wound Care left facial cheek laceration: Status post sutures received in ED.  Routine skin checks 7. Fluids/Electrolytes/Nutrition: Routine  in and outs with follow-up chemistries 8.  Diabetes mellitus.  Hemoglobin A1c 5.9.  Currently maintained on Semglee 8 units daily.  Patient on Tresiba 8 units daily prior to admission 9.  AKI on CKD stage III.  Baseline creatinine 1.18-1.26.  Follow-up chemistries 10.  Acute on chronic anemia.  Follow-up CBC 11.  Intrahepatic cholangiocarcinoma.  Patient on chemotherapy followed by Dr. Randa Evens.  Follow-up outpatient- port not accessed- missed last chemo treatment-her car broke down- will need to be here 3-4 weeks.  12.  Obesity.  BMI 27.62.  Dietary follow-up 13.  Hypertension.  Norvasc 2.5 mg daily.  Monitor with increased mobility 14.  History of tobacco use/COPD.  Continue inhalers as indicated 15.  Hyperlipidemia.  Lipitor 16. Complicated UTI.Empiric IV Rocephin 10/08/2022 changed to keflex for 7 days- pending culture, hopefully- will monitor 17. Neurogenic bladder- will do bladder scans q6 hours and cath if volumes >350cc 18. Neurogenic bowel and constipation- will get cleaned out- Sorbitol 45cc now and follow with SSE if no results- might need bowel program- not clear yet.  19. Stage III coccyx pressure ulcer- on admission- will automatically get WOC consult and determine if needs air mattress, place foam for the moment and turn often- will order turning q2 hours 20. Shivering/cold- will order kpad to warm her up and to help back pain.    I have personally performed a face to face diagnostic evaluation of this patient and formulated the key components of the plan.  Additionally, I have personally reviewed laboratory data, imaging studies, as well as relevant notes and concur with the physician assistant's documentation above.   The patient's status has not changed from the original H&P.  Any changes in documentation from the  acute care chart have been noted above.    Lavon Paganini Angiulli, PA-C 10/08/2022

## 2022-10-07 NOTE — Progress Notes (Signed)
Occupational Therapy Treatment Patient Details Name: Janice Short MRN: [id number] DOB: Mar 24, 1961 Today's Date: 10/07/2022   History of present illness Pt is a 61 y.o. female presenting to ED 10/26 s/p fall (head was hit on bed rail during fall).  Large laceration to face and shortened and externally rotate hip noted by EMS.  Imaging showing nondisplaced teardrop type fracture involving the anterior inferior aspect of the C2 vertebral body.  Pt admitted with closed fx of C2 vertebra; contusion of cervical cord C4-5 d/t accidental fall, L hip pain (x-ray hip showing no fx), laceration L cheek, hyperkalemia, and simple chronic bronchitis.  S/p ACDF C4-5 10/02/22.  PMH includes spinal stenosis, DM, metastatic intrahepatic cholangiocarcinoma on chemotherapy, htn, HLD, CTS.   OT comments  Upon entering session, pt resting in bed. Pt agreeable to OT/PT co-treatment to maximize safety and participation. Tx session targeted improving tolerance for functional mobility in the setting of ADL tasks. Pt required +2 assist for bed mobility and Min A to don/doff C collar. Pt demonstrated improvements in sitting balance at EOB, readjusting frequently. She attempted to stand from EOB 2x, however, unsuccessful despite Max A +2. Pt able to complete lateral/scoot transfer from EOB<>recliner this date. OT/PT encouraged pt to try to sit up for 45 min to an hour, only able to tolerate for ~10 minutes before requesting to lay back down. Pt was educated on [place] with handout and theraputty provided for improving ROM and strength. Pt left as received with all needs in reach. Pt is making progress toward goal completion. D/C recommendation remains appropriate. OT will continue to follow acutely.     Recommendations for follow up therapy are one component of a multi-disciplinary discharge planning process, led by the attending physician.  Recommendations may be updated based on patient status, additional functional criteria and  insurance authorization.    Follow Up Recommendations  Acute inpatient rehab (3hours/day)    Assistance Recommended at Discharge Frequent or constant Supervision/Assistance  Patient can return home with the following  Two people to help with walking and/or transfers;Assistance with cooking/housework;Assist for transportation;Help with stairs or ramp for entrance;Assistance with feeding;Two people to help with bathing/dressing/bathroom;Direct supervision/assist for medications management   Equipment Recommendations  Other (comment) (defer to next venue of care)    Recommendations for Other Services      Precautions / Restrictions Precautions Precautions: Cervical;Fall Precaution Booklet Issued: No Precaution Comments: Only needs brace when up and out of bed; may remove brace when in bed; apply/remove brace while sitting; may remove brace to shower; may ambulate to bathroom without brace; MAP >80 Required Braces or Orthoses: Cervical Brace Cervical Brace: Hard collar ([place] J collar) Restrictions Weight Bearing Restrictions: No       Mobility Bed Mobility Overal bed mobility: Needs Assistance Bed Mobility: Sit to Supine, Sidelying to Sit   Sidelying to sit: +2 for physical assistance, Max assist   Sit to supine: Max assist, +2 for physical assistance   General bed mobility comments: assist for trunk and BLEs; assist for log rolling technique; Max A +1 to scoot hips forward/backward at EOB     Transfers Overall transfer level: Needs assistance Equipment used: Rolling walker (2 wheels) Transfers: Sit to/from Stand, Bed to chair/wheelchair/BSC Sit to Stand: Max assist, +2 physical assistance          Lateral/Scoot Transfers: Mod assist, +2 physical assistance, Max assist General transfer comment: Attempted STS 2x with RW and B knee blocking, however, unable to clear hips off bed.  Completed lateral/scoot transfer from EOB>recliner with Mod A +2, Max A +1 to go from  recliner>EOB.     Balance Overall balance assessment: Needs assistance Sitting-balance support: Feet supported, Bilateral upper extremity supported Sitting balance-Leahy Scale: Fair     Standing balance support: Bilateral upper extremity supported Standing balance-Leahy Scale: Zero Standing balance comment: unable to stand at this time                           ADL either performed or assessed with clinical judgement   ADL Overall ADL's : Needs assistance/impaired                 Upper Body Dressing : Minimal assistance;Sitting;Bed level Upper Body Dressing Details (indicate cue type and reason): to don/doff C collar Lower Body Dressing: Maximal assistance;Bed level Lower Body Dressing Details (indicate cue type and reason): to don socks                    Extremity/Trunk Assessment Upper Extremity Assessment Upper Extremity Assessment: LUE deficits/detail;RUE deficits/detail RUE Deficits / Details: fair grip strength, wrist extension/flexion 3/5, elbow flexion/extension at least 3/5, shoulder flexion and abduction at least 3/5 (at least 90 degrees) LUE Deficits / Details: 0/5 grip strength, 2-/5 wrist extension, 0/5 wrist flexion, 3-/5 elbow flexion (with pain in L elbow), 1/5 elbow extension, shoulder flexion 2-/5, shoulder abduction 3/5 (limited to <90 deg), compensating during testing with shoulder movement   Lower Extremity Assessment Lower Extremity Assessment: Generalized weakness   Cervical / Trunk Assessment Cervical / Trunk Assessment: Neck Surgery    Vision Patient Visual Report: No change from baseline     Perception     Praxis      Cognition Arousal/Alertness: Awake/alert Behavior During Therapy: WFL for tasks assessed/performed Overall Cognitive Status: Within Functional Limits for tasks assessed                                          Exercises Other Exercises Other Exercises: Pt was educated on BUE HEP with  handout and red soft theraputty provided for improving ROM and strength.    Shoulder Instructions       General Comments      Pertinent Vitals/ Pain       Pain Assessment Pain Assessment: Faces Faces Pain Scale: Hurts worst Pain Location: BLEs, low back, neck Pain Descriptors / Indicators: Constant, Discomfort, Grimacing, Guarding, Sharp, Crying Pain Intervention(s): Limited activity within patient's tolerance, Monitored during session, Premedicated before session, Repositioned  Home Living                                          Prior Functioning/Environment              Frequency  Min 4X/week        Progress Toward Goals  OT Goals(current goals can now be found in the care plan section)  Progress towards OT goals: Progressing toward goals  Acute Rehab OT Goals Patient Stated Goal: reduce pain and return to PLOF OT Goal Formulation: With patient Time For Goal Achievement: 10/18/22 Potential to Achieve Goals: Good  Plan Discharge plan remains appropriate;Frequency remains appropriate    Co-evaluation    PT/OT/SLP Co-Evaluation/Treatment: Yes Reason for Co-Treatment: Complexity of  the patient's impairments (multi-system involvement);For patient/therapist safety;To address functional/ADL transfers PT goals addressed during session: Mobility/safety with mobility;Strengthening/ROM OT goals addressed during session: ADL's and self-care;Strengthening/ROM      AM-PAC OT "6 Clicks" Daily Activity     Outcome Measure   Help from another person eating meals?: A Little Help from another person taking care of personal grooming?: A Lot Help from another person toileting, which includes using toliet, bedpan, or urinal?: A Lot Help from another person bathing (including washing, rinsing, drying)?: A Lot Help from another person to put on and taking off regular upper body clothing?: A Lot Help from another person to put on and taking off regular lower  body clothing?: A Lot 6 Click Score: 13    End of Session Equipment Utilized During Treatment: Gait belt;Cervical collar  OT Visit Diagnosis: Other abnormalities of gait and mobility (R26.89);History of falling (Z91.81);Muscle weakness (generalized) (M62.81);Pain   Activity Tolerance Patient tolerated treatment well;Patient limited by pain   Patient Left in bed;with call bell/phone within reach;with bed alarm set   Nurse Communication Mobility status        Time: 1610-9604 OT Time Calculation (min): 37 min  Charges: OT General Charges $OT Visit: 1 Visit OT Treatments $Self Care/Home Management : 8-22 mins  [facility] MS, OTR/L ascom [phone]  10/07/22, 2:18 PM

## 2022-10-08 ENCOUNTER — Other Ambulatory Visit: Payer: Self-pay | Admitting: Family Medicine

## 2022-10-08 ENCOUNTER — Inpatient Hospital Stay (HOSPITAL_COMMUNITY)
Admission: RE | Admit: 2022-10-08 | Discharge: 2022-10-28 | DRG: 559 | Disposition: A | Discharge status: Home Health | Payer: Medicare Other | Source: Other Acute Inpatient Hospital | Attending: Physical Medicine and Rehabilitation | Admitting: Physical Medicine and Rehabilitation

## 2022-10-08 DIAGNOSIS — D696 Thrombocytopenia, unspecified: Secondary | ICD-10-CM | POA: Diagnosis not present

## 2022-10-08 DIAGNOSIS — G8250 Quadriplegia, unspecified: Secondary | ICD-10-CM | POA: Diagnosis present

## 2022-10-08 DIAGNOSIS — K649 Unspecified hemorrhoids: Secondary | ICD-10-CM | POA: Diagnosis present

## 2022-10-08 DIAGNOSIS — C221 Intrahepatic bile duct carcinoma: Secondary | ICD-10-CM | POA: Diagnosis not present

## 2022-10-08 DIAGNOSIS — Z79899 Other long term (current) drug therapy: Secondary | ICD-10-CM | POA: Diagnosis not present

## 2022-10-08 DIAGNOSIS — E669 Obesity, unspecified: Secondary | ICD-10-CM | POA: Diagnosis present

## 2022-10-08 DIAGNOSIS — K592 Neurogenic bowel, not elsewhere classified: Secondary | ICD-10-CM | POA: Diagnosis not present

## 2022-10-08 DIAGNOSIS — N390 Urinary tract infection, site not specified: Secondary | ICD-10-CM | POA: Diagnosis not present

## 2022-10-08 DIAGNOSIS — N319 Neuromuscular dysfunction of bladder, unspecified: Secondary | ICD-10-CM | POA: Diagnosis not present

## 2022-10-08 DIAGNOSIS — I129 Hypertensive chronic kidney disease with stage 1 through stage 4 chronic kidney disease, or unspecified chronic kidney disease: Secondary | ICD-10-CM | POA: Diagnosis not present

## 2022-10-08 DIAGNOSIS — E785 Hyperlipidemia, unspecified: Secondary | ICD-10-CM | POA: Diagnosis present

## 2022-10-08 DIAGNOSIS — Z794 Long term (current) use of insulin: Secondary | ICD-10-CM

## 2022-10-08 DIAGNOSIS — S12101D Unspecified nondisplaced fracture of second cervical vertebra, subsequent encounter for fracture with routine healing: Secondary | ICD-10-CM | POA: Diagnosis not present

## 2022-10-08 DIAGNOSIS — D631 Anemia in chronic kidney disease: Secondary | ICD-10-CM | POA: Diagnosis present

## 2022-10-08 DIAGNOSIS — F17210 Nicotine dependence, cigarettes, uncomplicated: Secondary | ICD-10-CM | POA: Diagnosis present

## 2022-10-08 DIAGNOSIS — S01412D Laceration without foreign body of left cheek and temporomandibular area, subsequent encounter: Secondary | ICD-10-CM | POA: Diagnosis not present

## 2022-10-08 DIAGNOSIS — S14129A Central cord syndrome at unspecified level of cervical spinal cord, initial encounter: Secondary | ICD-10-CM

## 2022-10-08 DIAGNOSIS — I10 Essential (primary) hypertension: Secondary | ICD-10-CM | POA: Diagnosis not present

## 2022-10-08 DIAGNOSIS — K5900 Constipation, unspecified: Secondary | ICD-10-CM | POA: Diagnosis present

## 2022-10-08 DIAGNOSIS — E1165 Type 2 diabetes mellitus with hyperglycemia: Secondary | ICD-10-CM | POA: Diagnosis present

## 2022-10-08 DIAGNOSIS — Z4789 Encounter for other orthopedic aftercare: Principal | ICD-10-CM

## 2022-10-08 DIAGNOSIS — J4489 Other specified chronic obstructive pulmonary disease: Secondary | ICD-10-CM

## 2022-10-08 DIAGNOSIS — E1122 Type 2 diabetes mellitus with diabetic chronic kidney disease: Secondary | ICD-10-CM | POA: Diagnosis present

## 2022-10-08 DIAGNOSIS — Z6827 Body mass index (BMI) 27.0-27.9, adult: Secondary | ICD-10-CM

## 2022-10-08 DIAGNOSIS — E1142 Type 2 diabetes mellitus with diabetic polyneuropathy: Secondary | ICD-10-CM | POA: Diagnosis not present

## 2022-10-08 DIAGNOSIS — M4802 Spinal stenosis, cervical region: Secondary | ICD-10-CM | POA: Diagnosis present

## 2022-10-08 DIAGNOSIS — E860 Dehydration: Secondary | ICD-10-CM | POA: Diagnosis present

## 2022-10-08 DIAGNOSIS — N1831 Chronic kidney disease, stage 3a: Secondary | ICD-10-CM | POA: Diagnosis present

## 2022-10-08 DIAGNOSIS — M79641 Pain in right hand: Secondary | ICD-10-CM | POA: Diagnosis not present

## 2022-10-08 DIAGNOSIS — L89153 Pressure ulcer of sacral region, stage 3: Secondary | ICD-10-CM | POA: Diagnosis present

## 2022-10-08 DIAGNOSIS — M79642 Pain in left hand: Secondary | ICD-10-CM | POA: Diagnosis not present

## 2022-10-08 DIAGNOSIS — Z841 Family history of disorders of kidney and ureter: Secondary | ICD-10-CM

## 2022-10-08 DIAGNOSIS — Z888 Allergy status to other drugs, medicaments and biological substances status: Secondary | ICD-10-CM

## 2022-10-08 DIAGNOSIS — Z7951 Long term (current) use of inhaled steroids: Secondary | ICD-10-CM

## 2022-10-08 DIAGNOSIS — B952 Enterococcus as the cause of diseases classified elsewhere: Secondary | ICD-10-CM | POA: Diagnosis present

## 2022-10-08 DIAGNOSIS — E875 Hyperkalemia: Secondary | ICD-10-CM | POA: Diagnosis present

## 2022-10-08 DIAGNOSIS — L8993 Pressure ulcer of unspecified site, stage 3: Secondary | ICD-10-CM | POA: Diagnosis present

## 2022-10-08 DIAGNOSIS — S12101A Unspecified nondisplaced fracture of second cervical vertebra, initial encounter for closed fracture: Secondary | ICD-10-CM | POA: Diagnosis not present

## 2022-10-08 DIAGNOSIS — Z7982 Long term (current) use of aspirin: Secondary | ICD-10-CM

## 2022-10-08 DIAGNOSIS — Z8249 Family history of ischemic heart disease and other diseases of the circulatory system: Secondary | ICD-10-CM

## 2022-10-08 DIAGNOSIS — E1169 Type 2 diabetes mellitus with other specified complication: Secondary | ICD-10-CM

## 2022-10-08 DIAGNOSIS — M1990 Unspecified osteoarthritis, unspecified site: Secondary | ICD-10-CM | POA: Diagnosis present

## 2022-10-08 DIAGNOSIS — N179 Acute kidney failure, unspecified: Secondary | ICD-10-CM | POA: Diagnosis not present

## 2022-10-08 LAB — GLUCOSE, CAPILLARY
Glucose-Capillary: 126 mg/dL — ABNORMAL HIGH (ref 70–99)
Glucose-Capillary: 133 mg/dL — ABNORMAL HIGH (ref 70–99)
Glucose-Capillary: 157 mg/dL — ABNORMAL HIGH (ref 70–99)
Glucose-Capillary: 161 mg/dL — ABNORMAL HIGH (ref 70–99)
Glucose-Capillary: 181 mg/dL — ABNORMAL HIGH (ref 70–99)

## 2022-10-08 LAB — CBC
HCT: 27.4 % — ABNORMAL LOW (ref 36.0–46.0)
Hemoglobin: 8.5 g/dL — ABNORMAL LOW (ref 12.0–15.0)
MCH: 30.7 pg (ref 26.0–34.0)
MCHC: 31 g/dL (ref 30.0–36.0)
MCV: 98.9 fL (ref 80.0–100.0)
Platelets: 313 10*3/uL (ref 150–400)
RBC: 2.77 MIL/uL — ABNORMAL LOW (ref 3.87–5.11)
RDW: 19.9 % — ABNORMAL HIGH (ref 11.5–15.5)
WBC: 14 10*3/uL — ABNORMAL HIGH (ref 4.0–10.5)
nRBC: 0 % (ref 0.0–0.2)

## 2022-10-08 LAB — BASIC METABOLIC PANEL
Anion gap: 4 — ABNORMAL LOW (ref 5–15)
BUN: 22 mg/dL (ref 8–23)
CO2: 23 mmol/L (ref 22–32)
Calcium: 7.9 mg/dL — ABNORMAL LOW (ref 8.9–10.3)
Chloride: 109 mmol/L (ref 98–111)
Creatinine, Ser: 1.5 mg/dL — ABNORMAL HIGH (ref 0.44–1.00)
GFR, Estimated: 39 mL/min — ABNORMAL LOW (ref >60)
Glucose, Bld: 135 mg/dL — ABNORMAL HIGH (ref 70–99)
Potassium: 4.2 mmol/L (ref 3.5–5.1)
Sodium: 136 mmol/L (ref 135–145)

## 2022-10-08 MED ORDER — UMECLIDINIUM BROMIDE 62.5 MCG/ACT IN AEPB
1.0000 | INHALATION_SPRAY | Freq: Every day | RESPIRATORY_TRACT | Status: DC
  Administered 2022-10-09 – 2022-10-28 (×19): 1 via RESPIRATORY_TRACT
  Filled 2022-10-08 (×3): qty 7

## 2022-10-08 MED ORDER — LACTATED RINGERS IV SOLN: INTRAVENOUS | Status: DC

## 2022-10-08 MED ORDER — ALBUTEROL SULFATE (2.5 MG/3ML) 0.083% IN NEBU: 2.5000 mg | INHALATION_SOLUTION | RESPIRATORY_TRACT | Status: DC | PRN

## 2022-10-08 MED ORDER — INSULIN ASPART 100 UNIT/ML IJ SOLN: 0.0000 [IU] | Freq: Three times a day (TID) | INTRAMUSCULAR | 11 refills | Status: DC

## 2022-10-08 MED ORDER — SODIUM CHLORIDE 0.9 % IV SOLN
2.0000 g | INTRAVENOUS | Status: DC
  Administered 2022-10-08: 2 g via INTRAVENOUS
  Filled 2022-10-08: qty 20

## 2022-10-08 MED ORDER — GABAPENTIN 300 MG PO CAPS
300.0000 mg | ORAL_CAPSULE | Freq: Two times a day (BID) | ORAL | Status: DC
  Administered 2022-10-08 – 2022-10-09 (×2): 300 mg via ORAL
  Filled 2022-10-08 (×2): qty 1

## 2022-10-08 MED ORDER — ONDANSETRON HCL 4 MG PO TABS
4.0000 mg | ORAL_TABLET | Freq: Four times a day (QID) | ORAL | Status: DC | PRN
  Administered 2022-10-26: 4 mg via ORAL
  Filled 2022-10-08: qty 1

## 2022-10-08 MED ORDER — SORBITOL 70 % SOLN
45.0000 mL | Freq: Once | Status: AC
  Administered 2022-10-08: 45 mL via ORAL
  Filled 2022-10-08: qty 60

## 2022-10-08 MED ORDER — ATORVASTATIN CALCIUM 40 MG PO TABS
40.0000 mg | ORAL_TABLET | Freq: Every day | ORAL | Status: DC
  Administered 2022-10-09 – 2022-10-28 (×20): 40 mg via ORAL
  Filled 2022-10-08 (×20): qty 1

## 2022-10-08 MED ORDER — OXYCODONE HCL 5 MG PO TABS
5.0000 mg | ORAL_TABLET | ORAL | Status: DC | PRN
  Administered 2022-10-08 – 2022-10-20 (×27): 5 mg via ORAL
  Filled 2022-10-08 (×27): qty 1

## 2022-10-08 MED ORDER — ACETAMINOPHEN 325 MG PO TABS
650.0000 mg | ORAL_TABLET | Freq: Four times a day (QID) | ORAL | Status: DC | PRN
  Administered 2022-10-10 – 2022-10-16 (×7): 650 mg via ORAL
  Filled 2022-10-08 (×8): qty 2

## 2022-10-08 MED ORDER — FLUTICASONE FUROATE-VILANTEROL 100-25 MCG/ACT IN AEPB
1.0000 | INHALATION_SPRAY | Freq: Every day | RESPIRATORY_TRACT | Status: DC
  Administered 2022-10-09 – 2022-10-28 (×19): 1 via RESPIRATORY_TRACT
  Filled 2022-10-08 (×2): qty 28

## 2022-10-08 MED ORDER — CEPHALEXIN 500 MG PO CAPS: 500.0000 mg | ORAL_CAPSULE | Freq: Three times a day (TID) | ORAL | 0 refills | Status: DC

## 2022-10-08 MED ORDER — AMLODIPINE BESYLATE 2.5 MG PO TABS
2.5000 mg | ORAL_TABLET | Freq: Every day | ORAL | Status: DC
  Administered 2022-10-09 – 2022-10-28 (×20): 2.5 mg via ORAL
  Filled 2022-10-08 (×20): qty 1

## 2022-10-08 MED ORDER — CEPHALEXIN 250 MG PO CAPS
500.0000 mg | ORAL_CAPSULE | Freq: Three times a day (TID) | ORAL | Status: DC
  Administered 2022-10-08: 500 mg via ORAL
  Filled 2022-10-08: qty 2

## 2022-10-08 MED ORDER — INSULIN GLARGINE-YFGN 100 UNIT/ML ~~LOC~~ SOLN
8.0000 [IU] | Freq: Every day | SUBCUTANEOUS | Status: DC
  Administered 2022-10-08 – 2022-10-21 (×14): 8 [IU] via SUBCUTANEOUS
  Filled 2022-10-08 (×15): qty 0.08

## 2022-10-08 MED ORDER — METHOCARBAMOL 500 MG PO TABS
500.0000 mg | ORAL_TABLET | Freq: Four times a day (QID) | ORAL | Status: DC | PRN
  Administered 2022-10-09 – 2022-10-15 (×7): 1000 mg via ORAL
  Administered 2022-10-16 (×2): 500 mg via ORAL
  Administered 2022-10-19: 1000 mg via ORAL
  Filled 2022-10-08 (×3): qty 2
  Filled 2022-10-08: qty 1
  Filled 2022-10-08 (×6): qty 2

## 2022-10-08 MED ORDER — CEPHALEXIN 250 MG PO CAPS
500.0000 mg | ORAL_CAPSULE | Freq: Three times a day (TID) | ORAL | Status: AC
  Administered 2022-10-08 – 2022-10-15 (×20): 500 mg via ORAL
  Filled 2022-10-08 (×20): qty 2

## 2022-10-08 MED ORDER — ACETAMINOPHEN 650 MG RE SUPP: 650.0000 mg | Freq: Four times a day (QID) | RECTAL | Status: DC | PRN

## 2022-10-08 MED ORDER — AMLODIPINE BESYLATE 2.5 MG PO TABS: 2.5000 mg | ORAL_TABLET | Freq: Every day | ORAL | Status: DC

## 2022-10-08 MED ORDER — ONDANSETRON HCL 4 MG/2ML IJ SOLN
4.0000 mg | Freq: Four times a day (QID) | INTRAMUSCULAR | Status: DC | PRN
  Administered 2022-10-27: 4 mg via INTRAVENOUS
  Filled 2022-10-08: qty 2

## 2022-10-08 MED ORDER — ESCITALOPRAM OXALATE 10 MG PO TABS
20.0000 mg | ORAL_TABLET | Freq: Every day | ORAL | Status: DC
  Administered 2022-10-09 – 2022-10-16 (×8): 20 mg via ORAL
  Filled 2022-10-08 (×8): qty 2

## 2022-10-08 MED ORDER — ENOXAPARIN SODIUM 40 MG/0.4ML IJ SOSY
40.0000 mg | PREFILLED_SYRINGE | INTRAMUSCULAR | Status: DC
  Administered 2022-10-08 – 2022-10-25 (×18): 40 mg via SUBCUTANEOUS
  Filled 2022-10-08 (×18): qty 0.4

## 2022-10-08 NOTE — Progress Notes (Signed)
Patient requested all 4 rails up.

## 2022-10-08 NOTE — Progress Notes (Signed)
Inpatient Rehabilitation Admissions Coordinator   Case discussed with Dr Naaman Plummer and Dr Dagoberto Ligas. We can offer Cir bed today at [facility] in [place]. I contacted Dr Reesa Chew to arrange discharge. I spoke with both patient and her daughter by phone and they are in agreement to CIR admit today. I will arrange Care link transport for 1230. Admitting Rehab MD is Dr Dagoberto Ligas and she will be admitted to [facility]. Acute team and TOC made aware.I will make the arrangements.  Danne Baxter, RN, MSN Rehab Admissions Coordinator [phone] 10/08/2022 10:43 AM

## 2022-10-08 NOTE — Progress Notes (Signed)
Patient arrived from [facility] via ambulance. Appears alert and denies pain at this time.

## 2022-10-08 NOTE — Care Management Important Message (Signed)
Important Message  Patient Details  Name: Janice Short MRN: [id number] Date of Birth: 19-Mar-1961   Medicare Important Message Given:  Yes     Dannette Barbara 10/08/2022, 11:07 AM

## 2022-10-08 NOTE — Progress Notes (Signed)
Inpatient Rehabilitation Admission Medication Review by a Pharmacist  A complete drug regimen review was completed for this patient to identify any potential clinically significant medication issues.  High Risk Drug Classes Is patient taking? Indication by Medication  Antipsychotic No   Anticoagulant Yes Lovenox - VTE ppx  Antibiotic Yes Po Cephalexin - UTI  Opioid Yes Prn oxycodone - pain  Antiplatelet No   Hypoglycemics/insulin Yes SSI - Lantus - DM  Vasoactive Medication Yes Amlodipine - BP  Chemotherapy No   Other Yes Atorvastatin - HLD Lexapro - Mood  Breo, Incruse inhalers, prn albuterol - COPD  Gabapentin - pain Methocarbamol - prn muscle spasms     Type of Medication Issue Identified Description of Issue Recommendation(s)  Drug Interaction(s) (clinically significant)     Duplicate Therapy     Allergy     No Medication Administration End Date     Incorrect Dose     Additional Drug Therapy Needed     Significant med changes from prior encounter (inform family/care partners about these prior to discharge).    Other       Clinically significant medication issues were identified that warrant physician communication and completion of prescribed/recommended actions by midnight of the next day:  No  Time spent performing this drug regimen review (minutes): 30 minutes  Thank you. Anette Guarneri, PharmD

## 2022-10-08 NOTE — Progress Notes (Signed)
Referring Physician:  No referring provider defined for this encounter.  Primary Physician:  Steele Sizer, MD   10/08/2022 Improved.  10/07/2022 Febrile today  10/06/22 No acute issues overnight  History of Present Illness:  Janice Short is here today with a chief complaint of mechanical fall yesterday.  She struck her face on a rail, and had immediate onset of change in her strength in her upper extremities.  She also had difficulty moving her left hand and right hand.  She lost strength in her left arm.  She was unable to get herself up from her fall, and was then brought in for evaluation. During evaluation, she was noted to have C2 fracture as well as severe stenosis at C4-5 with myelomalacia.  DOS: 10/02/22 - C4-5 ACDF  Interval History: Janice Short went to surgery yesterday for a C4-5 ACDF.  She has been able to tolerate some p.o. intake but her appetite is low.  She has some mild soreness with her throat but is able to swallow.  She has not noticed any swelling in her incision.  She does feel that she is improving slowly.  Review of Systems:  A 10 point review of systems is negative, except for the pertinent positives and negatives detailed in the HPI.  Past Medical History: Past Medical History:  Diagnosis Date   Allergy    Asthma    Carpal tunnel syndrome on left    Cervical radiculitis    Chronic osteoarthritis    Corns and callosity    Depression    Dermatophytosis of foot    Diabetes mellitus without complication (HCC)    Gout    Hyperlipidemia    Hypertension    Impingement syndrome of left shoulder    Intrahepatic cholangiocarcinoma (HCC)    Lumbosacral neuritis    Microalbuminuria    Obesity    Supraventricular tachycardia 07/01/2015   SVT (supraventricular tachycardia)    Tenosynovitis of wrist    Vitamin D deficiency     Past Surgical History: Past Surgical History:  Procedure Laterality Date   ABDOMINAL HYSTERECTOMY     ANTERIOR CERVICAL  DECOMP/DISCECTOMY FUSION N/A 10/02/2022   Procedure: ANTERIOR CERVICAL DECOMPRESSION/DISCECTOMY FUSION 1 LEVEL;  Surgeon: Meade Maw, MD;  Location: [facility] ORS;  Service: Neurosurgery;  Laterality: N/A;  ACDF C4-5.   COLONOSCOPY WITH PROPOFOL N/A 05/06/2021   Procedure: COLONOSCOPY WITH PROPOFOL;  Surgeon: Lucilla Lame, MD;  Location: [facility] ENDOSCOPY;  Service: Endoscopy;  Laterality: N/A;   ESOPHAGOGASTRODUODENOSCOPY (EGD) WITH PROPOFOL N/A 05/06/2021   Procedure: ESOPHAGOGASTRODUODENOSCOPY (EGD) WITH PROPOFOL;  Surgeon: Lucilla Lame, MD;  Location: [facility] ENDOSCOPY;  Service: Endoscopy;  Laterality: N/A;   PORTA CATH INSERTION N/A 07/17/2021   Procedure: PORTA CATH INSERTION;  Surgeon: Algernon Huxley, MD;  Location: [facility] CV LAB;  Service: Cardiovascular;  Laterality: N/A;    Allergies: Allergies as of 10/01/2022 - Review Complete 10/01/2022  Allergen Reaction Noted   Other Shortness Of Breath 09/01/2016   Cisplatin Itching 11/10/2021   Bydureon [exenatide] Other (See Comments) 06/18/2017    Medications: Current Meds  Medication Sig   albuterol (VENTOLIN HFA) 108 (90 Base) MCG/ACT inhaler INHALE TWO PUFFS BY MOUTH EVERY 6 HOURS AS NEEDED FOR WHEEZING AND FOR SHORTNESS OF BREATH (BULK)   aspirin 81 MG tablet Take 1 tablet by mouth daily.   atorvastatin (LIPITOR) 40 MG tablet Take 1 tablet (40 mg total) by mouth daily.   blood glucose meter kit and supplies Dispense based on patient  and insurance preference. Use up to four times daily as directed. (FOR ICD-10 E10.9, E11.9).   diphenhydramine-acetaminophen (TYLENOL PM) 25-500 MG TABS tablet Take 1 tablet by mouth at bedtime as needed.   escitalopram (LEXAPRO) 20 MG tablet TAKE ONE TABLET BY MOUTH DAILY AT 9AM   fluticasone (FLONASE) 50 MCG/ACT nasal spray Place 2 sprays into both nostrils daily as needed. USE 2 SPRAY(S) IN EACH NOSTRIL AT BEDTIME   Fluticasone-Umeclidin-Vilant (TRELEGY ELLIPTA) 100-62.5-25 MCG/ACT AEPB Inhale 1 puff  into the lungs daily. (Patient taking differently: Inhale 1 puff into the lungs daily.)   gabapentin (NEURONTIN) 300 MG capsule TAKE TWO CAPSULES BY MOUTH TWICE DAILY @ 9AM & 5PM   insulin degludec (TRESIBA FLEXTOUCH) 100 UNIT/ML FlexTouch Pen Inject 20 Units into the skin daily.   lidocaine-prilocaine (EMLA) cream Apply 1 Application topically as needed. Apply to port and cover with saran wrap 1-2 hours prior to port access    Social History: Social History   Tobacco Use   Smoking status: Some Days    Types: Cigarettes   Smokeless tobacco: Never   Tobacco comments:    1 per day 01/30/21 does not inhale it  Vaping Use   Vaping Use: Never used  Substance Use Topics   Alcohol use: Not Currently   Drug use: Not Currently    Types: Benzodiazepines    Family Medical History: Family History  Problem Relation Age of Onset   Heart attack Mother    CAD Mother    Kidney disease Father     Physical Examination: Vitals:   10/08/22 0355 10/08/22 0830  BP: (!) 118/58 (!) 99/58  Pulse: 100 91  Resp: 18 18  Temp: 99.1 F (37.3 C) 98.5 F (36.9 C)  SpO2: 99% 98%    General: Patient is calm collected, and in no apparent distress. Attention to examination is appropriate.  Neck: Incision clean dry and flat.   Respiratory: Patient is breathing without any difficulty.  NEUROLOGICAL:     Awake, alert, oriented to person, place.  Speech is clear and fluent. Fund of knowledge is appropriate.  Laceration on R cheek sewn together.    Side Biceps Triceps Deltoid Interossei Grip Wrist Ext. Wrist Flex.  R _0 4- 4- 4-  L 4- _1 Side Iliopsoas Quads Hamstring PF DF EHL  R _2 L _3 Medical Decision Making  Imaging: MRI C spine 10/01/22 IMPRESSION: 1. Nondisplaced fracture anterior inferior body of C2 not appreciated by MRI. No other fracture. No epidural hematoma. 2. Multilevel degenerative change most prominent at C4-5. Moderate spinal  stenosis at C4-5 with bilateral cord hyperintensity compatible with chronic compressive myelopathy.     Electronically Signed   By: Franchot Gallo M.D.   On: 10/01/2022 20:17  CT C spine 10/01/22 IMPRESSION: 1. No CT evidence for acute intracranial abnormality. Patchy white matter hypodensity likely chronic small vessel ischemic change. 2. Findings consistent with nondisplaced teardrop type fracture involving the anterior inferior aspect of the C2 vertebral body. 3. Multilevel moderate severe degenerative changes   Critical Value/emergent results were called by telephone at the time of interpretation on 10/01/2022 at 5:56 pm to provider [facility] , who verbally acknowledged these results.     Electronically Signed   By: Donavan Foil M.D.   On: 10/01/2022 17:56  I have personally reviewed the images and agree with  the above interpretation.  Assessment and Plan: Janice Short is a pleasant 61 y.o. female with C2 fracture that appears to be stable.  She has an incomplete spinal cord injury consistent with a central cord injury.    Continue PTOT Consider dispo planning Labs currently suggest urinary tract infection as the source of her fevers.  Once she is stable from this, I would favor disposition to rehab.  Meade Maw MD Neurosurgery

## 2022-10-08 NOTE — Discharge Instructions (Addendum)
Inpatient Rehab Discharge Instructions  Janice Short Discharge date and time: 10/28/22   Activities/Precautions/ Functional Status: Activity:  Cervical collar as directed Diet: Diabetic diet Wound Care: Routine skin checks   Functional status:  ___ No restrictions     ___ Walk up steps independently _X__ 24/7 supervision/assistance   ___ Walk up steps with assistance ___ Intermittent supervision/assistance  ___ Bathe/dress independently ___ Walk with walker     _x__ Bathe/dress with assistance ___ Walk Independently    ___ Shower independently ___ Walk with assistance    ___ Shower with assistance _X__ No alcohol     ___ Return to work/school ________   Special Instructions: No driving, smoking or alcohol 2.  Follow-up oncology service Dr. Janese Banks in regards to ongoing chemotherapy.   COMMUNITY REFERRALS UPON DISCHARGE:    Home Health:   PT  OT  RN                 [place] Phone: [phone]    Medical Equipment/Items Ordered: Vassie Moselle, [place] 3 IN 1                                                 Agency/Supplier:[facility]   [phone]    My questions have been answered and I understand these instructions. I will adhere to these goals and the provided educational materials after my discharge from the hospital.  Patient/Caregiver Signature _______________________________ Date __________  Clinician Signature _______________________________________ Date __________  Please bring this form and your medication list with you to all your follow-up doctor's appointments.

## 2022-10-08 NOTE — Progress Notes (Signed)
Courtney Heys, MD  Physician Physical Medicine and Rehabilitation   PMR Pre-admission    Signed   Date of Service: 10/06/2022  1:58 PM  Related encounter: ED to Hosp-Admission (Discharged) from 10/01/2022 in [facility]   Signed      Show:Clear all _0 Written_1 Templated_2 Copied  Added by: _3 Cristina Gong, RN_4 Courtney Heys, MD  _5 Hover for details PMR Admission Coordinator Pre-Admission Assessment   Patient: Janice Short is an 61 y.o., female MRN: [id number] DOB: Oct 24, 1961 Height: _6  (170.2 cm) Weight: 75.3 kg   Insurance Information HMO: yes    PPO:      PCP:      IPA:      80/20:      OTHER:  PRIMARY: [place] [employer] Dual complete   Policy#: [id number]      Subscriber: pt CM Name: Wilburn Cornelia    Phone#: [phone] option #7     Fax#: [phone] Pre-Cert#: [id number]  approved for 13 days    Employer:  Benefits:  Phone #: [phone]     Name: 10/30 Eff. Date: 02/04/22     Deduct: none      Out of Pocket Max: $8300      Life Max: none CIR: $1556 co pay per admission      SNF: no copay per day days 1 until 20; $200 co pay per day days 21 until 100 Outpatient: 80%     Co-Pay: visits per medical neccesity Home Health: 100%      Co-Pay: visits per medical neccesity DME: 80%     Co-Pay: 20% Providers: in network  SECONDARY: Medicaid [place] Access      Policy#: [id number]   Financial Counselor:       Phone#:    The "Data Collection Information Summary" for patients in Inpatient Rehabilitation Facilities with attached "Privacy Act [place] Records" was provided and verbally reviewed with: Patient and Family   Emergency Contact Information Contact Information       Name Relation Home Work Mobile    Lupu,Lindsey Daughter     [place] Son     [phone]    Kristine Linea     [phone]    Vella Kohler [phone]             Current Medical History  Patient  Admitting Diagnosis: central cord syndrome   History of Present Illness: Malky Rudzinski. Libman is a 61 year old right-handed female with history of diabetes mellitus, CKD stage III, chronic anemia, chronic bronchitis, intrahepatic cholangiocarcinoma on chemotherapy last infused 09/18/2022 followed by [name], hypertension, hyperlipidemia, gout, obesity with BMI 27.62, tobacco use, carpal tunnel syndrome on left.  [facility]ed to [facility]01/2022 following mechanical fall when she tripped, fell backwards striking her face on a railing.  She was unable to help herself up.  She sustained a laceration to the left cheek and complained of left hip pain and bilateral upper extremity numbness.     Admission chemistries unremarkable except potassium 5.7, creatinine 1.30, GFR 46, calcium 7.9, glucose 243, hemoglobin 9.7, WBC 12,200.  Patient's laceration to left cheek was sutured in the ED.  Cranial CT scan negative for intracranial abnormality.  CT maxillofacial negative.  CT left hip negative for fracture.  CT cervical spine findings consistent with nondisplaced teardrop type fracture involving the anterior inferior aspect of the C2 vertebral body as well as severe stenosis C4-5 with myelomalacia.  Neurosurgery consulted underwent anterior cervical discectomy decompression and fusion/cervical instrumentation C4-5  10/02/2022 per Dr. Izora Ribas.  Hard cervical collar when out of bed and can be applied in the sitting position.   Hospital course AKI on CKD stage III baseline serum creatinine 1.18 > 1.26.  Noted hyperkalemia 5.7 responded well to [facility] with latest potassium 4.2 and creatinine 1.41.  Acute on chronic anemia latest hemoglobin 9.6.  She was cleared to begin Lovenox for DVT prophylaxis.     Spiked a low-grade fever 101.1 10/07/2022 with venous Doppler studies negative, chest x-ray showed no acute disease, urinalysis negative nitrite WBC improved at 14,000 down from 17,200.Marland KitchenPlaced empirically on Rocephin  10/08/2022.      Patient's medical record from [facility] has been reviewed by the rehabilitation admission coordinator and physician.   Past Medical History      Past Medical History:  Diagnosis Date   Allergy     Asthma     Carpal tunnel syndrome on left     Cervical radiculitis     Chronic osteoarthritis     Corns and callosity     Depression     Dermatophytosis of foot     Diabetes mellitus without complication (HCC)     Gout     Hyperlipidemia     Hypertension     Impingement syndrome of left shoulder     Intrahepatic cholangiocarcinoma (HCC)     Lumbosacral neuritis     Microalbuminuria     Obesity     Supraventricular tachycardia 07/01/2015   SVT (supraventricular tachycardia)     Tenosynovitis of wrist     Vitamin D deficiency      Has the patient had major surgery during 100 days prior to admission? Yes   Family History   family history includes CAD in her mother; Heart attack in her mother; Kidney disease in her father.   Current Medications   Current Facility-Administered Medications:    acetaminophen (TYLENOL) tablet 650 mg, 650 mg, Oral, Q6H PRN, 650 mg at 10/08/22 0805 **OR** acetaminophen (TYLENOL) suppository 650 mg, 650 mg, Rectal, Q6H PRN, Meade Maw, MD   albuterol (PROVENTIL) (2.5 MG/3ML) 0.083% nebulizer solution 2.5 mg, 2.5 mg, Nebulization, Q2H PRN, Meade Maw, MD   amLODipine (NORVASC) tablet 2.5 mg, 2.5 mg, Oral, Daily, Shawna Clamp, MD, 2.5 mg at 10/08/22 0941   atorvastatin (LIPITOR) tablet 40 mg, 40 mg, Oral, Daily, Meade Maw, MD, 40 mg at 10/08/22 0942   cefTRIAXone (ROCEPHIN) 2 g in sodium chloride 0.9 % 100 mL IVPB, 2 g, Intravenous, Q24H, Amin, Soundra Pilon, MD, Last Rate: 200 mL/hr at 10/08/22 1024, 2 g at 10/08/22 1024   enoxaparin (LOVENOX) injection 40 mg, 40 mg, Subcutaneous, Q24H, Shawna Clamp, MD, 40 mg at 10/07/22 1733   escitalopram (LEXAPRO) tablet 20 mg, 20 mg, Oral, Daily, Meade Maw, MD, 20 mg at 10/08/22  0942   fluticasone furoate-vilanterol (BREO ELLIPTA) 100-25 MCG/ACT 1 puff, 1 puff, Inhalation, Daily, 1 puff at 10/08/22 0947 **AND** umeclidinium bromide (INCRUSE ELLIPTA) 62.5 MCG/ACT 1 puff, 1 puff, Inhalation, Daily, Meade Maw, MD, 1 puff at 10/08/22 0947   gabapentin (NEURONTIN) capsule 300 mg, 300 mg, Oral, BID, Meade Maw, MD, 300 mg at 10/08/22 0942   insulin aspart (novoLOG) injection 0-15 Units, 0-15 Units, Subcutaneous, TID AC & HS, Shawna Clamp, MD, 2 Units at 10/08/22 0804   insulin glargine-yfgn [facility]) injection 8 Units, 8 Units, Subcutaneous, Q2200, Meade Maw, MD, 8 Units at 10/07/22 2138   lactated ringers infusion, , Intravenous, Continuous, Lorella Nimrod, MD, Last Rate: 100 mL/hr at 10/08/22  60, New Bag at 10/08/22 0957   methocarbamol (ROBAXIN) tablet 500-1,000 mg, 500-1,000 mg, Oral, Q6H PRN, Meade Maw, MD, 1,000 mg at 10/07/22 2140   morphine (PF) 2 MG/ML injection 2 mg, 2 mg, Intravenous, Q2H PRN, Meade Maw, MD, 2 mg at 10/07/22 1645   ondansetron (ZOFRAN) tablet 4 mg, 4 mg, Oral, Q6H PRN **OR** ondansetron (ZOFRAN) injection 4 mg, 4 mg, Intravenous, Q6H PRN, Meade Maw, MD, 4 mg at 10/02/22 0119   Oral care mouth rinse, 15 mL, Mouth Rinse, PRN, Shawna Clamp, MD   oxyCODONE (Oxy IR/ROXICODONE) immediate release tablet 5 mg, 5 mg, Oral, Q3H PRN, Meade Maw, MD, 5 mg at 10/07/22 1642   Facility-Administered Medications Ordered in Other Encounters:    heparin lock flush 100 UNIT/ML injection, , , ,    Patients Current Diet:  Diet Order                  DIET SOFT Room service appropriate? Yes; Fluid consistency: Thin  Diet effective now                       Precautions / Restrictions Precautions Precautions: Cervical, Fall Precaution Booklet Issued: No Precaution Comments: Only needs brace when up and out of bed; may remove brace when in bed; apply/remove brace while sitting; may remove brace to  shower; may ambulate to bathroom without brace; MAP >80 Cervical Brace: Hard collar Restrictions Weight Bearing Restrictions: No    Has the patient had 2 or more falls or a fall with injury in the past year? Yes   Prior Activity Level Limited Community (1-2x/wk): Independent   Prior Functional Level Self Care: Did the patient need help bathing, dressing, using the toilet or eating? Independent   Indoor Mobility: Did the patient need assistance with walking from room to room (with or without device)? Independent   Stairs: Did the patient need assistance with internal or external stairs (with or without device)? Independent   Functional Cognition: Did the patient need help planning regular tasks such as shopping or remembering to take medications? Independent   Patient Information Are you of Hispanic, Latino/a,or Spanish origin?: A. No, not of Hispanic, Latino/a, or Spanish origin What is your race?: B. Black or African American Do you need or want an interpreter to communicate with a doctor or health care staff?: 0. No   Patient's Response To:  Health Literacy and Transportation Is the patient able to respond to health literacy and transportation needs?: Yes Health Literacy - How often do you need to have someone help you when you read instructions, pamphlets, or other written material from your doctor or pharmacy?: Never In the past 12 months, has lack of transportation kept you from medical appointments or from getting medications?: No In the past 12 months, has lack of transportation kept you from meetings, work, or from getting things needed for daily living?: No   Development worker, international aid / [place] Devices/Equipment: None Home Equipment: BSC/3in1, Kasandra Knudsen - single point   Prior Device Use: Indicate devices/aids used by the patient prior to current illness, exacerbation or injury? None of the above   Current Functional Level Cognition   Overall Cognitive Status:  Within Functional Limits for tasks assessed Orientation Level: Oriented X4 General Comments: Pt is A and O but does voice anxiety about going to [place] for rehab    Extremity Assessment (includes Sensation/Coordination)   Upper Extremity Assessment: LUE deficits/detail, RUE deficits/detail RUE Deficits / Details:  fair grip strength, wrist extension/flexion 3/5, elbow flexion/extension at least 3/5, shoulder flexion and abduction at least 3/5 (at least 90 degrees) RUE: Unable to fully assess due to pain LUE Deficits / Details: 0/5 grip strength, 2-/5 wrist extension, 0/5 wrist flexion, 3-/5 elbow flexion (with pain in L elbow), 1/5 elbow extension, shoulder flexion 2-/5, shoulder abduction 3/5 (limited to <90 deg), compensating during testing with shoulder movement LUE: Unable to fully assess due to pain  Lower Extremity Assessment: Generalized weakness RLE Deficits / Details: hip flexion at least 2+/5; knee flexion/extension at least 2/5 (limited d/t R knee pain); DF/PF at least 3/5 AROM RLE: Unable to fully assess due to pain LLE Deficits / Details: hip flexion at least 2+/5; knee flexion/extension at least 2+/5; DF/PF at least 3/5 AROM     ADLs   Overall ADL's : Needs assistance/impaired Eating/Feeding: Set up, Bed level Eating/Feeding Details (indicate cue type and reason): Pt completing self-feeding with R hand, some difficulty accessing items on tray, attempted to set up pt for optimal positioning Grooming: Moderate assistance, Wash/dry face, Sitting Upper Body Dressing : Minimal assistance, Sitting, Bed level Upper Body Dressing Details (indicate cue type and reason): to don/doff C collar Lower Body Dressing: Maximal assistance, Bed level Lower Body Dressing Details (indicate cue type and reason): to don socks Toileting- Clothing Manipulation and Hygiene: Maximal assistance, Bed level     Mobility   Overal bed mobility: Needs Assistance Bed Mobility: Sit to Supine, Sidelying to  Sit Rolling: Mod assist Sidelying to sit: +2 for physical assistance, Max assist Supine to sit: Max assist, +2 for safety/equipment Sit to supine: Max assist, +2 for physical assistance General bed mobility comments: assist for trunk and BLEs; assist for log rolling technique; Max A +1 to scoot hips forward/backward at EOB     Transfers   Overall transfer level: Needs assistance Equipment used: Rolling walker (2 wheels) Transfers: Sit to/from Stand Sit to Stand: Max assist, +2 physical assistance, From elevated surface, +2 safety/equipment Bed to/from chair/wheelchair/BSC transfer type:: Lateral/scoot transfer  Lateral/Scoot Transfers: Mod assist, +2 physical assistance, +2 safety/equipment, From elevated surface General transfer comment: Attempted STS 2x with RW and B knee blocking, however, unable to clear hips off bed. Completed lateral/scoot transfer from EOB>recliner with Mod A +2, Max A +1 to go from recliner>EOB.     Ambulation / Gait / Stairs / Wheelchair Mobility   Ambulation/Gait General Gait Details: currently unable     Posture / Balance Dynamic Sitting Balance Sitting balance - Comments: close SBA to min assist (pt intermittently adjusting herself in bed and leaning R or forward) Balance Overall balance assessment: Needs assistance Sitting-balance support: Feet supported, Bilateral upper extremity supported Sitting balance-Leahy Scale: Fair Sitting balance - Comments: close SBA to min assist (pt intermittently adjusting herself in bed and leaning R or forward) Postural control: Right lateral lean Standing balance support: Bilateral upper extremity supported Standing balance-Leahy Scale: Zero Standing balance comment: pt unable to fully stand erect or clear hips     Special needs/care consideration Palliative Medicine, Altha Harm involved as an outpatient    Previous Home Environment  Living Arrangements:  (Lives with adult daughter, Mendel Ryder and pt's 63 year old  grandson)  Lives With: Daughter Available Help at Discharge: Family, Available 24 hours/day (daughter works remote from home) Type of Home: Mobile home Home Layout: One level Home Access: Ramped entrance Bathroom Shower/Tub: Chiropodist: Standard Bathroom Accessibility: Yes How Accessible: Accessible via walker [place]: No  Discharge Living Setting Plans for Discharge Living Setting: Lives with (comment), Mobile Home (daughter) Type of Home at Discharge: Mobile home Discharge Home Layout: One level Discharge Home Access: [place] entrance Discharge Bathroom Shower/Tub: [place] unit Discharge Bathroom Toilet: Standard Discharge Bathroom Accessibility: Yes How Accessible: Accessible via walker Does the patient have any problems obtaining your medications?: No   Social/Family/Support Systems Patient Roles: Parent Contact Information: daughter, Mendel Ryder Anticipated Caregiver: daughter Anticipated Caregiver's Contact Information: see contacts Ability/Limitations of Caregiver: daughter works home remote and has 72 year old autistic child Caregiver Availability: 24/7 Discharge Plan Discussed with Primary Caregiver: Yes Is Caregiver In Agreement with Plan?: Yes Does Caregiver/Family have Issues with Lodging/Transportation while Pt is in Rehab?: No   Daughter works remote from home. Does not drive. Son works night shift and provides all transportation. Yolanda Bonine is 63 years old and autistic. She very much wants to see him .  Goals Patient/Family Goal for Rehab: supervision PT, supervision to min OT Expected length of stay: ELOS 3 to 4 weeks Pt/Family Agrees to Admission and willing to participate: Yes Program Orientation Provided & Reviewed with Pt/Caregiver Including Roles  & Responsibilities: Yes   Decrease burden of Care through IP rehab admission:n/a   Possible need for SNF placement upon discharge: not anticipated   Patient Condition: I have  reviewed medical records from [facility], spoken with CSW, and patient, daughter, and family member. I met with patient at the bedside and discussed via phone for inpatient rehabilitation assessment.  Patient will benefit from ongoing PT and OT, can actively participate in 3 hours of therapy a day 5 days of the week, and can make measurable gains during the admission.  Patient will also benefit from the coordinated team approach during an Inpatient Acute Rehabilitation admission.  The patient will receive intensive therapy as well as Rehabilitation physician, nursing, social worker, and care management interventions.  Due to bladder management, bowel management, safety, skin/wound care, disease management, medication administration, pain management, and patient education the patient requires 24 hour a day rehabilitation nursing.  The patient is currently max assist overall with mobility and basic ADLs.  Discharge setting and therapy post discharge at home with home health is anticipated.  Patient has agreed to participate in the Acute Inpatient Rehabilitation Program and will admit today.   Preadmission Screen Completed By:  Cleatrice Burke, 10/08/2022 10:50 AM ______________________________________________________________________   Discussed status with Dr. Dagoberto Ligas on 10/08/22 at 1049 and received approval for admission today.   Admission Coordinator:  Cleatrice Burke, RN, time [id number] Date 10/08/22    Assessment/Plan: Diagnosis: Does the need for close, 24 hr/day Medical supervision in concert with the patient's rehab needs make it unreasonable for this patient to be served in a less intensive setting? Yes Co-Morbidities requiring supervision/potential complications: P5/0 quadriplegia- central cord syndrome; neurogenic bowel and bladder, UTI on IV ABX; intrahepatic carcinoma; DM, HTN, SVT, gout Due to bladder management, bowel management, safety, skin/wound care, disease management, medication  administration, pain management, and patient education, does the patient require 24 hr/day rehab nursing? Yes Does the patient require coordinated care of a physician, rehab nurse, PT, OT, and SLP to address physical and functional deficits in the context of the above medical diagnosis(es)? Yes Addressing deficits in the following areas: balance, endurance, locomotion, strength, transferring, bowel/bladder control, bathing, dressing, feeding, grooming, toileting, and swallowing Can the patient actively participate in an intensive therapy program of at least 3 hrs of therapy 5 days a week? Yes The potential for patient  to make measurable gains while on inpatient rehab is good Anticipated functional outcomes upon discharge from inpatient rehab: supervision and min assist PT, supervision and min assist OT, n/a SLP Estimated rehab length of stay to reach the above functional goals is: 3-4 weeks Anticipated discharge destination: Home 10. Overall Rehab/Functional Prognosis: good     MD Signature:           Revision History

## 2022-10-08 NOTE — H&P (Addendum)
Physical Medicine and Rehabilitation Admission H&P         Chief Complaint  Patient presents with   Fall      Head was hit on bed rail during fall  : HPI: Janice. Short is a 61 year old right-handed female with history of diabetes mellitus, CKD stage III, chronic anemia, chronic bronchitis, intrahepatic cholangiocarcinoma on chemotherapy last infused 09/18/2022 followed by [name], hypertension, hyperlipidemia, gout, obesity with BMI 27.62, tobacco use, carpal tunnel syndrome on left.  Per chart review patient lives with daughter and 71-year-old grandson.  Mobile home with ramped entrance.  Reportedly independent prior to admission.  Presented to [facility] 10/01/2022 following mechanical fall when she tripped, fell backwards striking her face on a railing.  She was unable to help herself up.  She sustained a laceration to the left cheek and complained of left hip pain and bilateral upper extremity numbness.  Admission chemistries unremarkable except potassium 5.7, creatinine 1.30, GFR 46, calcium 7.9, glucose 243, hemoglobin 9.7, WBC 12,200.  Patient's laceration to left cheek was sutured in the ED.  Cranial CT scan negative for intracranial abnormality.  CT maxillofacial negative.  CT left hip negative for fracture.  CT cervical spine findings consistent with nondisplaced teardrop type fracture involving the anterior inferior aspect of the C2 vertebral body as well as severe stenosis C4-5 with myelomalacia.  Neurosurgery consulted underwent anterior cervical discectomy decompression and fusion/cervical instrumentation C4-5 10/02/2022 per Dr. Izora Ribas.  Hard cervical collar when out of bed and can be applied in the sitting position.  Hospital course AKI on CKD stage III baseline serum creatinine 1.18 > 1.26.  Noted hyperkalemia 5.7 responded well to [facility] with latest potassium 4.2 and creatinine 1.50.  Leukocytosis 21,600 improved to 14,000.  Acute on chronic anemia latest hemoglobin 9.6.  She  was cleared to begin Lovenox for DVT prophylaxis.  Spiked a low-grade fever 101.1 10/07/2022 with venous Doppler studies negative, chest x-ray showed no acute disease, urinalysis negative nitrite WBC improved at 14,000 down from 17,200.Marland KitchenPlaced empirically on Rocephin 10/08/2022 changed to Keflex for 7 days- changed since likely has neurogenic bladder.   Therapy evaluations completed due to patient decreased functional mobility was admitted for a comprehensive rehab program.   Pt reports was using she thinks a purewick, and thinks can pee, but doesn't have "full control" of urine- hasn't been receiving bladder scans to see if emptying. Also hasn't had a BM for 8 days, the night before admission.    Has a very sore bum- due to pressure ulcer and says it "stinks".  Pain is horrible- mainly low back- doesn't know when last had pain meds- but was due when got here, per nurse.    Also c/o muscle spasms in RLE- as well as shivering due to feeling really cold.      Review of Systems  Constitutional:  Negative for chills and fever.  HENT:  Negative for hearing loss.   Eyes:  Negative for blurred vision and double vision.  Respiratory:  Negative for cough and shortness of breath.   Cardiovascular:  Negative for chest pain and palpitations.  Gastrointestinal:  Positive for constipation. Negative for heartburn, nausea and vomiting.  Genitourinary:  Positive for frequency and urgency. Negative for dysuria, flank pain and hematuria.  Musculoskeletal:  Positive for falls, joint pain and myalgias.  Skin:  Negative for rash.  Psychiatric/Behavioral:  Positive for depression.   All other systems reviewed and are negative.  Past Medical History:  Diagnosis Date   Allergy     Asthma     Carpal tunnel syndrome on left     Cervical radiculitis     Chronic osteoarthritis     Corns and callosity     Depression     Dermatophytosis of foot     Diabetes mellitus without complication (HCC)     Gout      Hyperlipidemia     Hypertension     Impingement syndrome of left shoulder     Intrahepatic cholangiocarcinoma (HCC)     Lumbosacral neuritis     Microalbuminuria     Obesity     Supraventricular tachycardia 07/01/2015   SVT (supraventricular tachycardia)     Tenosynovitis of wrist     Vitamin D deficiency           Past Surgical History:  Procedure Laterality Date   ABDOMINAL HYSTERECTOMY       ANTERIOR CERVICAL DECOMP/DISCECTOMY FUSION N/A 10/02/2022    Procedure: ANTERIOR CERVICAL DECOMPRESSION/DISCECTOMY FUSION 1 LEVEL;  Surgeon: Meade Maw, MD;  Location: [facility] ORS;  Service: Neurosurgery;  Laterality: N/A;  ACDF C4-5.   COLONOSCOPY WITH PROPOFOL N/A 05/06/2021    Procedure: COLONOSCOPY WITH PROPOFOL;  Surgeon: Lucilla Lame, MD;  Location: [facility] ENDOSCOPY;  Service: Endoscopy;  Laterality: N/A;   ESOPHAGOGASTRODUODENOSCOPY (EGD) WITH PROPOFOL N/A 05/06/2021    Procedure: ESOPHAGOGASTRODUODENOSCOPY (EGD) WITH PROPOFOL;  Surgeon: Lucilla Lame, MD;  Location: [facility] ENDOSCOPY;  Service: Endoscopy;  Laterality: N/A;   PORTA CATH INSERTION N/A 07/17/2021    Procedure: PORTA CATH INSERTION;  Surgeon: Algernon Huxley, MD;  Location: [facility];  Service: Cardiovascular;  Laterality: N/A;         Family History  Problem Relation Age of Onset   Heart attack Mother     CAD Mother     Kidney disease Father      Social History:  reports that she has been smoking cigarettes. She has never used smokeless tobacco. She reports that she does not currently use alcohol. She reports that she does not currently use drugs after having used the following drugs: Benzodiazepines. Allergies:       Allergies  Allergen Reactions   Other Shortness Of Breath      Cat dander and grass pollen   Cisplatin Itching   Bydureon [Exenatide] Other (See Comments)      Pain with injection not allergy          Medications Prior to Admission  Medication Sig Dispense Refill   albuterol (VENTOLIN HFA)  108 (90 Base) MCG/ACT inhaler INHALE TWO PUFFS BY MOUTH EVERY 6 HOURS AS NEEDED FOR WHEEZING AND FOR SHORTNESS OF BREATH (BULK) 18 g 0   aspirin 81 MG tablet Take 1 tablet by mouth daily.       atorvastatin (LIPITOR) 40 MG tablet Take 1 tablet (40 mg total) by mouth daily. 90 tablet 1   blood glucose meter kit and supplies Dispense based on patient and insurance preference. Use up to four times daily as directed. (FOR ICD-10 E10.9, E11.9). 1 each 0   diphenhydramine-acetaminophen (TYLENOL PM) 25-500 MG TABS tablet Take 1 tablet by mouth at bedtime as needed.       escitalopram (LEXAPRO) 20 MG tablet TAKE ONE TABLET BY MOUTH DAILY AT 9AM 90 tablet 1   fluticasone (FLONASE) 50 MCG/ACT nasal spray Place 2 sprays into both nostrils daily as needed. USE 2 SPRAY(S) IN EACH NOSTRIL AT BEDTIME 16 g  0   Fluticasone-Umeclidin-Vilant (TRELEGY ELLIPTA) 100-62.5-25 MCG/ACT AEPB Inhale 1 puff into the lungs daily. (Patient taking differently: Inhale 1 puff into the lungs daily.) 3 each 1   gabapentin (NEURONTIN) 300 MG capsule TAKE TWO CAPSULES BY MOUTH TWICE DAILY @ 9AM & 5PM 360 capsule 1   insulin degludec (TRESIBA FLEXTOUCH) 100 UNIT/ML FlexTouch Pen Inject 20 Units into the skin daily. 15 mL 1   lidocaine-prilocaine (EMLA) cream Apply 1 Application topically as needed. Apply to port and cover with saran wrap 1-2 hours prior to port access 30 g 3   Insulin Pen Needle 32G X 6 MM MISC 1 each by Does not apply route daily at 12 noon. 100 each 1   ondansetron (ZOFRAN) 4 MG tablet Take 1 tablet (4 mg total) by mouth every 8 (eight) hours as needed. (Patient not taking: Reported on 10/01/2022) 20 tablet 0          Home: [place] expects to be discharged to:: Private residence Living Arrangements:  (Lives with adult daughter, Mendel Ryder and pt's 53 year old grandson) Available Help at Discharge: Family, Available 24 hours/day (daughter works remote from home) Type of Home: Mobile home Home  Access: Ramped entrance Home Layout: One level Bathroom Shower/Tub: Chiropodist: Standard Bathroom Accessibility: Yes Home Equipment: BSC/3in1, Radio producer - single point  Lives With: Daughter   Functional History: Prior Function Prior Level of Function : Independent/Modified Independent, History of Falls (last six months) Mobility Comments: Independent with ambulation; no other recent falls ADLs Comments: Independent   Functional Status:  Mobility: Bed Mobility Overal bed mobility: Needs Assistance Bed Mobility: Sit to Supine, Sidelying to Sit Rolling: Mod assist Sidelying to sit: +2 for physical assistance, Max assist Supine to sit: Max assist, +2 for safety/equipment Sit to supine: Max assist, +2 for physical assistance General bed mobility comments: assist for trunk and BLEs; assist for log rolling technique; Max A +1 to scoot hips forward/backward at EOB Transfers Overall transfer level: Needs assistance Equipment used: Rolling walker (2 wheels) Transfers: Sit to/from Stand Sit to Stand: Max assist, +2 physical assistance, From elevated surface, +2 safety/equipment Bed to/from chair/wheelchair/BSC transfer type:: Lateral/scoot transfer  Lateral/Scoot Transfers: Mod assist, +2 physical assistance, +2 safety/equipment, From elevated surface General transfer comment: Attempted STS 2x with RW and B knee blocking, however, unable to clear hips off bed. Completed lateral/scoot transfer from EOB>recliner with Mod A +2, Max A +1 to go from recliner>EOB. Ambulation/Gait General Gait Details: currently unable   ADL: ADL Overall ADL's : Needs assistance/impaired Eating/Feeding: Set up, Bed level Eating/Feeding Details (indicate cue type and reason): Pt completing self-feeding with R hand, some difficulty accessing items on tray, attempted to set up pt for optimal positioning Grooming: Moderate assistance, Wash/dry face, Sitting Upper Body Dressing : Minimal assistance,  Sitting, Bed level Upper Body Dressing Details (indicate cue type and reason): to don/doff C collar Lower Body Dressing: Maximal assistance, Bed level Lower Body Dressing Details (indicate cue type and reason): to don socks Toileting- Clothing Manipulation and Hygiene: Maximal assistance, Bed level   Cognition: Cognition Overall Cognitive Status: Within Functional Limits for tasks assessed Orientation Level: Oriented X4 Cognition Arousal/Alertness: Awake/alert Behavior During Therapy: WFL for tasks assessed/performed Overall Cognitive Status: Within Functional Limits for tasks assessed General Comments: Pt is A and O but does voice anxiety about going to [place] for rehab   Physical Exam: Blood pressure (!) 118/58, pulse 100, temperature 99.1 F (37.3 C), resp. rate 18, height _0  (  1.702 m), weight 75.3 kg, SpO2 99 %. Physical Exam Vitals and nursing note reviewed.  Constitutional:      Appearance: She is normal weight.     Comments: Awake, alert, shivering even with 3 blankets on her- also feels like 3 blankets too heavy- supine in bed; 2 RN's in room and NT, No acute distress  HENT:     Head: Normocephalic.     Comments: R cheek sutured with ~ 5-6 stitches- well approximated and starting to heal    Right Ear: External ear normal.     Left Ear: External ear normal.     Nose: Nose normal. No congestion.     Mouth/Throat:     Mouth: Mucous membranes are dry.     Pharynx: Oropharynx is clear.  Eyes:     General:        Right eye: No discharge.        Left eye: No discharge.     Extraocular Movements: Extraocular movements intact.  Neck:     Comments: Anterior neck- looks like glued anterior cervical incision Cardiovascular:     Rate and Rhythm: Regular rhythm. Tachycardia present.     Heart sounds: Normal heart sounds. No murmur heard.    No gallop.     Comments: Rate low 100s;  Pulmonary:     Effort: Pulmonary effort is normal. No respiratory distress.     Breath  sounds: Normal breath sounds. No wheezing, rhonchi or rales.  Abdominal:     General: There is distension.     Tenderness: There is no abdominal tenderness. There is no guarding or rebound.     Comments: A little firm; distended; NT; hypoactive BS  Genitourinary:    Comments: Vulva has no rashes/wounds Musculoskeletal:     Comments: RUE- Bicep 4+/5; triceps 4/5; WE 4-/5; grip 4-/5; FA 4-/5 LUE- Biceps 4-/5; WE 3-/5; Triceps 2/5; Grip 2-/5 and FA 1/5 RLE- HF, KE 4/5; DF/PF and EHL 4+/5 LLE- DF/PF 4/5 and EHL 4-/5- pt unable to let me uncover her again, she's so cold to test rest of LLE  Skin:    General: Skin is warm and dry.     Comments: Skin laceration to left facial cheek with stitches in place.  ACDF site clean and dry Stage III- superficial stage III 1.3 x 1.0 x 0.5 cm on coccyx with a few tiny spots that are more superficial around it Port not accessed- R chest Dry skin on feet- some bunions and hammertoes R forearm IV- looks OK  Neurological:     Mental Status: She is alert.     Comments: Patient is alert and oriented x3.  Follows full commands. Ox3 Not able to check sensory exam due to cold today No increased tone or clonus RLE/LLE  Psychiatric:     Comments: Flat, sad affect but very compliant with exam        Lab Results Last 48 Hours        Results for orders placed or performed during the hospital encounter of 10/01/22 (from the past 48 hour(s))  Glucose, capillary     Status: Abnormal    Collection Time: 10/06/22  7:41 AM  Result Value Ref Range    Glucose-Capillary 171 (H) 70 - 99 mg/dL      Comment: Glucose reference range applies only to samples taken after fasting for at least 8 hours.  CBC     Status: Abnormal    Collection Time: 10/06/22  9:37 AM  Result Value Ref Range    WBC 17.2 (H) 4.0 - 10.5 K/uL    RBC 3.12 (L) 3.87 - 5.11 MIL/uL    Hemoglobin 9.6 (L) 12.0 - 15.0 g/dL    HCT 30.6 (L) 36.0 - 46.0 %    MCV 98.1 80.0 - 100.0 fL    MCH 30.8 26.0 -  34.0 pg    MCHC 31.4 30.0 - 36.0 g/dL    RDW 20.2 (H) 11.5 - 15.5 %    Platelets 272 150 - 400 K/uL    nRBC 0.0 0.0 - 0.2 %      Comment: Performed at [facility] [address] [place], [place] [address]  Basic metabolic panel     Status: Abnormal    Collection Time: 10/06/22  9:37 AM  Result Value Ref Range    Sodium 135 135 - 145 mmol/L    Potassium 4.2 3.5 - 5.1 mmol/L    Chloride 108 98 - 111 mmol/L    CO2 23 22 - 32 mmol/L    Glucose, Bld 157 (H) 70 - 99 mg/dL      Comment: Glucose reference range applies only to samples taken after fasting for at least 8 hours.    BUN 21 8 - 23 mg/dL    Creatinine, Ser 1.41 (H) 0.44 - 1.00 mg/dL    Calcium 8.0 (L) 8.9 - 10.3 mg/dL    GFR, Estimated 42 (L) >60 mL/min      Comment: (NOTE) Calculated using the CKD-EPI Creatinine Equation (2021)      Anion gap 4 (L) 5 - 15      Comment: Performed at [facility], [address] [place], [place] [address]  Glucose, capillary     Status: Abnormal    Collection Time: 10/06/22 12:20 PM  Result Value Ref Range    Glucose-Capillary 198 (H) 70 - 99 mg/dL      Comment: Glucose reference range applies only to samples taken after fasting for at least 8 hours.  Glucose, capillary     Status: Abnormal    Collection Time: 10/06/22  3:49 PM  Result Value Ref Range    Glucose-Capillary 154 (H) 70 - 99 mg/dL      Comment: Glucose reference range applies only to samples taken after fasting for at least 8 hours.  Glucose, capillary     Status: Abnormal    Collection Time: 10/06/22 10:07 PM  Result Value Ref Range    Glucose-Capillary 238 (H) 70 - 99 mg/dL      Comment: Glucose reference range applies only to samples taken after fasting for at least 8 hours.  Glucose, capillary     Status: Abnormal    Collection Time: 10/07/22 12:37 AM  Result Value Ref Range    Glucose-Capillary 187 (H) 70 - 99 mg/dL      Comment: Glucose reference range applies only to samples taken after fasting  for at least 8 hours.  Glucose, capillary     Status: Abnormal    Collection Time: 10/07/22  3:52 AM  Result Value Ref Range    Glucose-Capillary 147 (H) 70 - 99 mg/dL      Comment: Glucose reference range applies only to samples taken after fasting for at least 8 hours.  CBC     Status: Abnormal    Collection Time: 10/07/22  7:14 AM  Result Value Ref Range    WBC 13.4 (H) 4.0 - 10.5 K/uL    RBC 3.08 (L) 3.87 -  5.11 MIL/uL    Hemoglobin 9.7 (L) 12.0 - 15.0 g/dL    HCT 30.8 (L) 36.0 - 46.0 %    MCV 100.0 80.0 - 100.0 fL    MCH 31.5 26.0 - 34.0 pg    MCHC 31.5 30.0 - 36.0 g/dL    RDW 19.9 (H) 11.5 - 15.5 %    Platelets 271 150 - 400 K/uL    nRBC 0.0 0.0 - 0.2 %      Comment: Performed at [facility], [address] [place], [place] [address]  Magnesium     Status: None    Collection Time: 10/07/22  7:14 AM  Result Value Ref Range    Magnesium 2.3 1.7 - 2.4 mg/dL      Comment: Performed at [facility], [address] [place], [place] [address]  Basic metabolic panel     Status: Abnormal    Collection Time: 10/07/22  7:14 AM  Result Value Ref Range    Sodium 135 135 - 145 mmol/L    Potassium 4.3 3.5 - 5.1 mmol/L    Chloride 108 98 - 111 mmol/L    CO2 23 22 - 32 mmol/L    Glucose, Bld 127 (H) 70 - 99 mg/dL      Comment: Glucose reference range applies only to samples taken after fasting for at least 8 hours.    BUN 22 8 - 23 mg/dL    Creatinine, Ser 1.41 (H) 0.44 - 1.00 mg/dL    Calcium 8.2 (L) 8.9 - 10.3 mg/dL    GFR, Estimated 42 (L) >60 mL/min      Comment: (NOTE) Calculated using the CKD-EPI Creatinine Equation (2021)      Anion gap 4 (L) 5 - 15      Comment: Performed at [facility], [address] [place], [place] [address]  Phosphorus     Status: None    Collection Time: 10/07/22  7:14 AM  Result Value Ref Range    Phosphorus 3.3 2.5 - 4.6 mg/dL      Comment: Performed at [facility], [address] [place], [place]  [address]  Glucose, capillary     Status: Abnormal    Collection Time: 10/07/22  7:28 AM  Result Value Ref Range    Glucose-Capillary 140 (H) 70 - 99 mg/dL      Comment: Glucose reference range applies only to samples taken after fasting for at least 8 hours.  Glucose, capillary     Status: Abnormal    Collection Time: 10/07/22 11:12 AM  Result Value Ref Range    Glucose-Capillary 150 (H) 70 - 99 mg/dL      Comment: Glucose reference range applies only to samples taken after fasting for at least 8 hours.  Glucose, capillary     Status: Abnormal    Collection Time: 10/07/22  3:57 PM  Result Value Ref Range    Glucose-Capillary 175 (H) 70 - 99 mg/dL      Comment: Glucose reference range applies only to samples taken after fasting for at least 8 hours.  Glucose, capillary     Status: Abnormal    Collection Time: 10/07/22  8:26 PM  Result Value Ref Range    Glucose-Capillary 137 (H) 70 - 99 mg/dL      Comment: Glucose reference range applies only to samples taken after fasting for at least 8 hours.  Urinalysis, Complete w Microscopic Urine, Clean Catch     Status: Abnormal  Collection Time: 10/07/22  9:53 PM  Result Value Ref Range    Color, Urine AMBER (A) YELLOW      Comment: BIOCHEMICALS MAY BE AFFECTED BY COLOR    APPearance HAZY (A) CLEAR    Specific Gravity, Urine 1.015 1.005 - 1.030    pH 5.0 5.0 - 8.0    Glucose, UA NEGATIVE NEGATIVE mg/dL    Hgb urine dipstick NEGATIVE NEGATIVE    Bilirubin Urine NEGATIVE NEGATIVE    Ketones, ur NEGATIVE NEGATIVE mg/dL    Protein, ur 30 (A) NEGATIVE mg/dL    Nitrite NEGATIVE NEGATIVE    Leukocytes,Ua LARGE (A) NEGATIVE    RBC / HPF 6-10 0 - 5 RBC/hpf    WBC, UA >50 (H) 0 - 5 WBC/hpf    Bacteria, UA RARE (A) NONE SEEN    Squamous Epithelial / LPF 0-5 0 - 5    WBC Clumps PRESENT      Mucus PRESENT        Comment: Performed at [facility], [address] [place], [place] [address]  Glucose, capillary     Status: Abnormal     Collection Time: 10/08/22 12:54 AM  Result Value Ref Range    Glucose-Capillary 157 (H) 70 - 99 mg/dL      Comment: Glucose reference range applies only to samples taken after fasting for at least 8 hours.  Glucose, capillary     Status: Abnormal    Collection Time: 10/08/22  3:52 AM  Result Value Ref Range    Glucose-Capillary 133 (H) 70 - 99 mg/dL      Comment: Glucose reference range applies only to samples taken after fasting for at least 8 hours.       Imaging Results (Last 48 hours)  US Venous Img Lower Bilateral (DVT)   Result Date: 10/07/2022 CLINICAL DATA:  Injury, fall, fever EXAM: BILATERAL LOWER EXTREMITY VENOUS DOPPLER ULTRASOUND TECHNIQUE: Gray-scale sonography with graded compression, as well as color Doppler and duplex ultrasound were performed to evaluate the lower extremity deep venous systems from the level of the common femoral vein and including the common femoral, femoral, profunda femoral, popliteal and calf veins including the posterior tibial, peroneal and gastrocnemius veins when visible. Spectral Doppler was utilized to evaluate flow at rest and with distal augmentation maneuvers in the common femoral, femoral and popliteal veins. COMPARISON:  None Available. FINDINGS: RIGHT LOWER EXTREMITY Common Femoral Vein: No evidence of thrombus. Normal compressibility, respiratory phasicity and response to augmentation. Saphenofemoral Junction: No evidence of thrombus. Normal compressibility and flow on color Doppler imaging. Profunda Femoral Vein: No evidence of thrombus. Normal compressibility and flow on color Doppler imaging. Femoral Vein: No evidence of thrombus. Normal compressibility, respiratory phasicity and response to augmentation. Popliteal Vein: No evidence of thrombus. Normal compressibility, respiratory phasicity and response to augmentation. Calf Veins: No evidence of thrombus. Normal compressibility and flow on color Doppler imaging. LEFT LOWER EXTREMITY Common  Femoral Vein: No evidence of thrombus. Normal compressibility, respiratory phasicity and response to augmentation. Saphenofemoral Junction: No evidence of thrombus. Normal compressibility and flow on color Doppler imaging. Profunda Femoral Vein: No evidence of thrombus. Normal compressibility and flow on color Doppler imaging. Femoral Vein: No evidence of thrombus. Normal compressibility, respiratory phasicity and response to augmentation. Popliteal Vein: No evidence of thrombus. Normal compressibility, respiratory phasicity and response to augmentation. Calf Veins: No evidence of thrombus. Normal compressibility and flow on color Doppler imaging. IMPRESSION: No evidence of deep venous thrombosis in either lower extremity. Electronically  Signed   By: Jerilynn Mages.  Shick M.D.   On: 10/07/2022 18:27    DG Chest 2 View   Result Date: 10/07/2022 CLINICAL DATA:  Fever EXAM: CHEST - 2 VIEW COMPARISON:  10/01/2022 FINDINGS: RIGHT jugular Port-A-Cath with tip projecting over SVC. Normal heart size, mediastinal contours, and pulmonary vascularity. Atherosclerotic calcification aorta. Lungs clear. No infiltrate, pleural effusion, or pneumothorax. Motion artifacts degrade lateral view. Bones demineralized. IMPRESSION: No acute abnormalities. Aortic Atherosclerosis (ICD10-I70.0). Electronically Signed   By: Lavonia Dana M.D.   On: 10/07/2022 17:49           Blood pressure (!) 118/58, pulse 100, temperature 99.1 F (37.3 C), resp. rate 18, height _0  (1.702 m), weight 75.3 kg, SpO2 99 %.   Medical Problem List and Plan: 1. 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  Status post ACDF C4-5 10/02/2022 per Dr.Yarbrough.  Cervical collar when out of bed applied in sitting position             -patient may  shower- monitor skin             -ELOS/Goals: 3-4  weeks- min A hopefully 2.  Antithrombotics: -DVT/anticoagulation:  Pharmaceutical: Lovenox.  Venous Doppler studies 10/07/2022 negative             -antiplatelet therapy: N/A 3. Pain Management: Neurontin 300 mg twice daily, Robaxin and oxycodone as needed 4. Mood/Behavior/Sleep: Lexapro 25 mg daily             -antipsychotic agents: N/A 5. Neuropsych/cognition: This patient is capable of making decisions on her own behalf. 6. Skin/Wound Care left facial cheek laceration: Status post sutures received in ED.  Routine skin checks 7. Fluids/Electrolytes/Nutrition: Routine in and outs with follow-up chemistries 8.  Diabetes mellitus.  Hemoglobin A1c 5.9.  Currently maintained on Semglee 8 units daily.  Patient on Tresiba 8 units daily prior to admission 9.  AKI on CKD stage III.  Baseline creatinine 1.18-1.26.  Follow-up chemistries 10.  Acute on chronic anemia.  Follow-up CBC 11.  Intrahepatic cholangiocarcinoma.  Patient on chemotherapy followed by Dr. Randa Evens.  Follow-up outpatient- port not accessed- missed last chemo treatment-her car broke down- will need to be here 3-4 weeks.  12.  Obesity.  BMI 27.62.  Dietary follow-up 13.  Hypertension.  Norvasc 2.5 mg daily.  Monitor with increased mobility 14.  History of tobacco use/COPD.  Continue inhalers as indicated 15.  Hyperlipidemia.  Lipitor 16. Complicated UTI.Empiric IV Rocephin 10/08/2022 changed to keflex for 7 days- pending culture, hopefully- will monitor 17. Neurogenic bladder- will do bladder scans q6 hours and cath if volumes >350cc 18. Neurogenic bowel and constipation- will get cleaned out- Sorbitol 45cc now and follow with SSE if no results- might need bowel program- not clear yet.  19. Stage III coccyx pressure ulcer- on admission- will automatically get WOC consult and determine if needs air mattress, place foam for the moment and turn often- will order turning q2 hours 20. Shivering/cold- will order kpad to warm her up and to  help back pain.      I have personally performed a face to face diagnostic evaluation of this patient and formulated the key components of the plan.  Additionally, I have personally reviewed laboratory data, imaging studies, as well as relevant notes and concur with the physician assistant's documentation above.   The  patient's status has not changed from the original H&P.  Any changes in documentation from the acute care chart have been noted above.     Lavon Paganini Angiulli, PA-C 10/08/2022

## 2022-10-08 NOTE — TOC Transition Note (Signed)
Transition of Care [facility]) - CM/SW Discharge Note   Patient Details  Name: Janice Short MRN: [id number] Date of Birth: [birth year]  [facility] CM/SW Contact:  Candie Chroman, LCSW Phone Number: 10/08/2022, 11:21 AM   Clinical Narrative:  Patient has orders to discharge to [facility] today. CIR admissions coordinator has arranged [facility] transport for 12:30. Transport paperwork is in her discharge packet. No further concerns. CSW signing off.   Final next level of care: IP Rehab Facility Barriers to Discharge: Barriers Resolved   Patient Goals and CMS Choice   CMS Medicare.gov Compare Post Acute Care list provided to:: Patient Choice offered to / list presented to : Patient, Adult Children  Discharge Placement                Patient to be transferred to facility by: [facility]   Patient and family notified of of transfer: 10/08/22  Discharge Plan and Services     Post Acute Care Choice:  (TBD)                               Social Determinants of Health ([place]) Interventions Food Insecurity Interventions: Inpatient TOC, Other (Comment) ([place] resource list that includes food bank information.)   Readmission Risk Interventions     No data to display

## 2022-10-08 NOTE — Discharge Summary (Signed)
Physician Discharge Summary   Patient: Janice Short MRN: [id number] DOB: 20-Jul-1961  Admit date:     10/01/2022  Discharge date: 10/08/22  Discharge Physician: Lorella Nimrod   PCP: Steele Sizer, MD   Recommendations at discharge:  Please obtain CBC and BMP daily until leukocytosis and renal function stabilizes and started improving. Continue with Keflex for 5 days. Follow-up urine culture results and make changes to antibiotics if needed. Encourage p.o. hydration. If renal functions does not improve then give her  more IV fluid. Follow-up with primary care provider and neurosurgery. 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  This 61 years old female with PMH significant for IDDM, chronic bronchitis, intrahepatic cholangiocarcinoma on chemotherapy, last infusion on 09/18/2022 presented in the ED s/p accidental fall.  She was unable to stand up.  She sustained a laceration on the left cheek and complained of left hip pain and bilateral upper extremity numbness since the fall.  Imaging in the ED shows nondisplaced fracture of anterior inferior body of C2.  No epidural hematoma.  Multilevel degenerative changes most prominent at C4 and C5. Patient was placed on cervical collar. ED physician has spoken with neurosurgeon Dr. Cari Caraway who recommended ACDF for acute spinal contusion at C4-C5 due to severe spinal stenosis.   Patient underwent ACDF with neurosurgery of  C4-C5 on 10/02/2022. PT was recommending [facility]-pending insurance  11/1: Patient had insurance approval today.  She was refusing to go today stating that she will leave tomorrow morning.  [facility] will notify as about any bed availability within next 24 to 48 hours. Patient developed fever at 101.9 in the afternoon.  No other symptoms. Message sent to neurosurgery.  Blood cultures ordered.  11/2: Patient was afebrile this morning.  Labs with slight worsening of leukocytosis at 14, decreasing hemoglobin to 8.5 and rising creatinine at 1.50.  UA with large leukocytes, blood cultures pending.  Chest x-ray normal.  Lower extremity venous Doppler with no DVT. Sending urine culture and starting her on ceftriaxone and LR.  No contrast exposure during current hospitalization. Patient had a bed at [facility] and they can manage her UTI.  She received a dose of ceftriaxone and discharged on Keflex.  [facility] can follow up on urine culture results and change antibiotics as needed. Patient received some IV fluid.  Need to encourage for p.o. hydration. She can get some IV fluid if needed at [facility]. Patient need monitoring of renal function as appears dry.  She will continue on current medications.  Patient need to follow-up with primary care provider and neurosurgery for further recommendations.   Assessment and Plan: * Closed fracture of C2 vertebra (HCC) Contusion of cervical cord C4-5 secondary to accidental fall Discussed with Dr. Cari Caraway and she was taken to the OR for ACDF on 10/27. Remained stable since procedure. PT/OT are recommending [facility]-now had insurance authorization. -Continue with pain management  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[place]) - See above  Fever Patient developed  fever in the late afternoon.  No other associated symptoms.  No cough or congestion.  Incision site without any obvious infection.  CBC with improvement of leukocytosis this morning. -Check UA and blood culture -Message sent to  neurosurgery. -Continue to monitor -Supportive care  Left hip pain Secondary to accidental fall X-ray hip showing no fracture Pain control  Laceration of cheek without complication, left, initial encounter Sutured in the ED Wound care  Uncontrolled type 2 diabetes mellitus with hyperglycemia, with long-term current use of insulin (HCC) Sliding scale insulin coverage Basal insulin  Hypertension Blood pressure within goal. -Continue low-dose amlodipine  Intrahepatic cholangiocarcinoma (HCC) - Outpatient follow-up with oncology  Stage 3a chronic kidney disease (HCC) AKI with history of stage IIIa.  Creatinine currently stable at 1.41  up from baseline of 1.17.   -We will give another liter of fluid -Monitor renal function  Hyperkalemia Resolved with [place]. -Continue to monitor  Simple chronic bronchitis ([place]) DuoNebs as needed Trelegy Ellipta     Consultants: Neurosurgery Procedures performed: ACDF Disposition: Rehabilitation facility Diet recommendation:  Discharge Diet Orders (From admission, onward)     Start     Ordered   10/08/22 0000  Diet - low sodium heart healthy        10/08/22 1117           Cardiac and Carb modified diet DISCHARGE MEDICATION: Allergies as of 10/08/2022       Reactions   Other Shortness Of Breath   Cat dander and grass pollen   Cisplatin Itching   Bydureon [exenatide] Other (See Comments)   Pain with injection not allergy        Medication List     STOP taking these medications    ondansetron 4 MG tablet Commonly known as: ZOFRAN       TAKE these medications    albuterol 108 (90 Base) MCG/ACT inhaler Commonly known as: VENTOLIN HFA INHALE TWO PUFFS BY MOUTH EVERY 6 HOURS AS NEEDED FOR WHEEZING AND FOR SHORTNESS OF BREATH (BULK)   amLODipine 2.5 MG tablet Commonly known as: NORVASC Take 1 tablet (2.5 mg total) by mouth daily. Start taking on: October 09, 2022   aspirin 81 MG tablet Take 1 tablet by  mouth daily.   atorvastatin 40 MG tablet Commonly known as: LIPITOR Take 1 tablet (40 mg total) by mouth daily.   blood glucose meter kit and supplies Dispense based on patient and insurance preference. Use up to four times daily as directed. (FOR ICD-10 E10.9, E11.9).   cephALEXin 500 MG capsule Commonly known as: KEFLEX Take 1 capsule (500 mg total) by mouth 3 (three) times daily for 5 days.   diphenhydramine-acetaminophen 25-500 MG Tabs tablet Commonly known as: TYLENOL PM Take 1 tablet by mouth at bedtime as needed.   escitalopram 20 MG tablet Commonly known as: LEXAPRO TAKE ONE TABLET BY MOUTH DAILY AT 9AM   fluticasone 50 MCG/ACT nasal spray Commonly known as: FLONASE Place 2 sprays into both nostrils daily as needed. USE 2 SPRAY(S) IN EACH NOSTRIL AT BEDTIME   gabapentin 300 MG capsule Commonly known as: NEURONTIN TAKE TWO CAPSULES BY MOUTH TWICE DAILY @ 9AM & 5PM   insulin aspart 100 UNIT/ML injection Commonly known as: novoLOG Inject 0-15 Units into the skin 4 (four) times daily -  before meals and at bedtime.   Insulin Pen Needle 32G X 6 MM Misc 1 each by Does not apply route daily at 12 noon.   lidocaine-prilocaine cream Commonly known  as: EMLA Apply 1 Application topically as needed. Apply to port and cover with saran wrap 1-2 hours prior to port access   Trelegy Ellipta 100-62.5-25 MCG/ACT Aepb Generic drug: Fluticasone-Umeclidin-Vilant Inhale 1 puff into the lungs daily.   Tyler Aas FlexTouch 100 UNIT/ML FlexTouch Pen Generic drug: insulin degludec Inject 20 Units into the skin daily.               Discharge Care Instructions  (From admission, onward)           Start     Ordered   10/08/22 0000  No dressing needed        10/08/22 1117            Follow-up Information     Steele Sizer, MD. Schedule an appointment as soon as possible for a visit in 1 week(s).   Specialty: Family Medicine Contact information: [address] [place] [address] [phone]         Meade Maw, MD. Schedule an appointment as soon as possible for a visit in 1 week(s).   Specialty: Neurosurgery Contact information: [address] [place] [address] [phone]                Discharge Exam: Danley Danker Weights   10/01/22 1629 10/02/22 0716 10/07/22 0505  Weight: 80 kg 80 kg 75.3 kg   General.  Well-developed lady, in no acute distress. Pulmonary.  Lungs clear bilaterally, normal respiratory effort. CV.  Regular rate and rhythm, no JVD, rub or murmur. Abdomen.  Soft, nontender, nondistended, BS positive. CNS.  Alert and oriented .   Extremities.  No edema, no cyanosis, pulses intact and symmetrical. Psychiatry.  Judgment and insight appears normal.   Condition at discharge: stable  The results of significant diagnostics from this hospitalization (including imaging, microbiology, ancillary and laboratory) are listed below for reference.   Imaging Studies: US Venous Img Lower Bilateral (DVT)  Result Date: 10/07/2022 CLINICAL DATA:  Injury, fall, fever EXAM: BILATERAL LOWER EXTREMITY VENOUS DOPPLER ULTRASOUND TECHNIQUE: Gray-scale sonography with graded compression, as well as color Doppler and duplex ultrasound were performed to evaluate the lower extremity deep venous systems from the level of the common femoral vein and including the common femoral, femoral, profunda femoral, popliteal and calf veins including the posterior tibial, peroneal and gastrocnemius veins when visible. Spectral Doppler was utilized to evaluate flow at rest and with distal augmentation maneuvers in the common femoral, femoral and popliteal veins. COMPARISON:  None Available. FINDINGS: RIGHT LOWER EXTREMITY Common Femoral Vein: No evidence of thrombus. Normal compressibility, respiratory phasicity and response to augmentation. Saphenofemoral Junction: No evidence of thrombus. Normal compressibility and flow on  color Doppler imaging. Profunda Femoral Vein: No evidence of thrombus. Normal compressibility and flow on color Doppler imaging. Femoral Vein: No evidence of thrombus. Normal compressibility, respiratory phasicity and response to augmentation. Popliteal Vein: No evidence of thrombus. Normal compressibility, respiratory phasicity and response to augmentation. Calf Veins: No evidence of thrombus. Normal compressibility and flow on color Doppler imaging. LEFT LOWER EXTREMITY Common Femoral Vein: No evidence of thrombus. Normal compressibility, respiratory phasicity and response to augmentation. Saphenofemoral Junction: No evidence of thrombus. Normal compressibility and flow on color Doppler imaging. Profunda Femoral Vein: No evidence of thrombus. Normal compressibility and flow on color Doppler imaging. Femoral Vein: No evidence of thrombus. Normal compressibility, respiratory phasicity and response to augmentation. Popliteal Vein: No evidence of thrombus. Normal compressibility, respiratory phasicity and response to augmentation. Calf Veins: No evidence  of thrombus. Normal compressibility and flow on color Doppler imaging. IMPRESSION: No evidence of deep venous thrombosis in either lower extremity. Electronically Signed   By: Jerilynn Mages.  Shick M.D.   On: 10/07/2022 18:27   DG Chest 2 View  Result Date: 10/07/2022 CLINICAL DATA:  Fever EXAM: CHEST - 2 VIEW COMPARISON:  10/01/2022 FINDINGS: RIGHT jugular Port-A-Cath with tip projecting over SVC. Normal heart size, mediastinal contours, and pulmonary vascularity. Atherosclerotic calcification aorta. Lungs clear. No infiltrate, pleural effusion, or pneumothorax. Motion artifacts degrade lateral view. Bones demineralized. IMPRESSION: No acute abnormalities. Aortic Atherosclerosis (ICD10-I70.0). Electronically Signed   By: Lavonia Dana M.D.   On: 10/07/2022 17:49   DG Cervical Spine 2-3 Views  Result Date: 10/02/2022 CLINICAL DATA:  Provided history: Anterior cervical  decompression/discectomy with fusion of C4-C5. Provided fluoroscopy time: 2 seconds EXAM: CERVICAL SPINE - 2-3 VIEW COMPARISON:  Cervical spine MRI 10/01/2022. Cervical spine CT 10/01/2022. FINDINGS: Three intraoperative fluoroscopic images of the cervical spine are submitted. On the initial lateral projection fluoroscopic image, a metallic surgical instrument projects anterior to the C4-C5 disc space. On the subsequent fluoroscopic images acquired at 9:23 a.m., ACDF hardware and an interbody device are present at the C4-C5 level. A known acute fracture of the C2 body was better appreciated on the prior cervical spine CT of 10/01/2022. Partially imaged ET tube. IMPRESSION: Three intraoperative fluoroscopic images of the cervical spine from C4-C5 ACDF, as described. Electronically Signed   By: Kellie Simmering D.O.   On: 10/02/2022 09:47   DG C-Arm 1-60 Min-No Report  Result Date: 10/02/2022 Fluoroscopy was utilized by the requesting physician.  No radiographic interpretation.   MR Cervical Spine Wo Contrast  Result Date: 10/01/2022 CLINICAL DATA:  Neck trauma.  Fall EXAM: MRI CERVICAL SPINE WITHOUT CONTRAST TECHNIQUE: Multiplanar, multisequence MR imaging of the cervical spine was performed. No intravenous contrast was administered. COMPARISON:  CT cervical spine 10/03/2022 FINDINGS: Alignment: Mild anterolisthesis C3-4. Straightening of the cervical lordosis Vertebrae: No fracture or bone marrow edema. Small cortical fracture of the anterior inferior C2 vertebral body is seen on CT but not appreciated on MRI. Cord: Bilateral cord hyperintensity at C4-5 which appears chronic and related to compressive myelopathy. No other cord signal abnormality Posterior Fossa, vertebral arteries, paraspinal tissues: Negative. No prevertebral soft tissue swelling or edema. Disc levels: C2-3: Negative for stenosis C3-4: Small central disc protrusion with calcification. Mild cord flattening and mild spinal stenosis. Mild  foraminal narrowing bilaterally due to spurring. C4-5: Moderately large central disc protrusion with associated spurring. Cord flattening with moderate spinal stenosis. Small cord hyperintensity bilaterally which appears chronic. Moderate foraminal narrowing bilaterally due to spurring C5-6: Mild disc degeneration and spurring. Mild foraminal narrowing bilaterally. Mild central canal stenosis. C6-7: Mild disc degeneration. Moderate left foraminal narrowing due to spurring C7-T1: Negative IMPRESSION: 1. Nondisplaced fracture anterior inferior body of C2 not appreciated by MRI. No other fracture. No epidural hematoma. 2. Multilevel degenerative change most prominent at C4-5. Moderate spinal stenosis at C4-5 with bilateral cord hyperintensity compatible with chronic compressive myelopathy. Electronically Signed   By: Franchot Gallo M.D.   On: 10/01/2022 20:17   CT Head Wo Contrast  Result Date: 10/01/2022 CLINICAL DATA:  Fall with laceration to right cheek EXAM: CT HEAD WITHOUT CONTRAST CT MAXILLOFACIAL WITHOUT CONTRAST CT CERVICAL SPINE WITHOUT CONTRAST TECHNIQUE: Multidetector CT imaging of the head, cervical spine, and maxillofacial structures were performed using the standard protocol without intravenous contrast. Multiplanar CT image reconstructions of the cervical spine and maxillofacial structures  were also generated. RADIATION DOSE REDUCTION: This exam was performed according to the departmental dose-optimization program which includes automated exposure control, adjustment of the mA and/or kV according to patient size and/or use of iterative reconstruction technique. COMPARISON:  CT 08/30/2021 FINDINGS: CT HEAD FINDINGS Brain: No acute territorial infarction, hemorrhage or intracranial mass. Patchy white matter hypodensity likely chronic small vessel ischemic change. The ventricles are nonenlarged. Vascular: No hyperdense vessels.  Carotid vascular calcification Skull: Normal. Negative for fracture or  focal lesion. Other: Opacified right maxillary sinus. Mucosal thickening in the ethmoid sinuses CT MAXILLOFACIAL FINDINGS Osseous: Mastoid air cells are clear. Mandibular heads are normally position. No mandibular fracture. Pterygoid plates and zygomatic arches appear intact. No acute nasal bone fracture Orbits: Negative. No traumatic or inflammatory finding. Sinuses: Completely opacified right maxillary sinus. Moderate mucosal thickening in the ethmoid sinuses. No sinus wall fracture. Soft tissues: Moderate to large right periorbital and infraorbital soft tissue hematoma with small foci of gas consistent with laceration CT CERVICAL SPINE FINDINGS Alignment: Straightening of the cervical spine. No subluxation. Facet alignment within normal limits. Skull base and vertebrae: Vertebral body heights are maintained. Suspected nondisplaced fracture lucency involving the anterior inferior corner of the C2 vertebral body, best seen on sagittal images. Correlate is seen on axial series 9, image 28. No other discrete fractures are visualized. Soft tissues and spinal canal: No significant prevertebral soft tissue enlargement. Disc levels: No abnormal widening or narrowing of C2-C3 disc space. There are multilevel degenerative changes. Moderate severe disc space narrowing and degenerative change C4-C5, C5-C6, C6-C7 and C7-T1. Facet degenerative changes at multiple levels with foraminal narrowing Upper chest: Negative. Other: None IMPRESSION: 1. No CT evidence for acute intracranial abnormality. Patchy white matter hypodensity likely chronic small vessel ischemic change. 2. Findings consistent with nondisplaced teardrop type fracture involving the anterior inferior aspect of the C2 vertebral body. 3. Multilevel moderate severe degenerative changes Critical Value/emergent results were called by telephone at the time of interpretation on 10/01/2022 at 5:56 pm to provider [facility] , who verbally acknowledged these results.  Electronically Signed   By: Donavan Foil M.D.   On: 10/01/2022 17:56   CT Cervical Spine Wo Contrast  Result Date: 10/01/2022 CLINICAL DATA:  Fall with laceration to right cheek EXAM: CT HEAD WITHOUT CONTRAST CT MAXILLOFACIAL WITHOUT CONTRAST CT CERVICAL SPINE WITHOUT CONTRAST TECHNIQUE: Multidetector CT imaging of the head, cervical spine, and maxillofacial structures were performed using the standard protocol without intravenous contrast. Multiplanar CT image reconstructions of the cervical spine and maxillofacial structures were also generated. RADIATION DOSE REDUCTION: This exam was performed according to the departmental dose-optimization program which includes automated exposure control, adjustment of the mA and/or kV according to patient size and/or use of iterative reconstruction technique. COMPARISON:  CT 08/30/2021 FINDINGS: CT HEAD FINDINGS Brain: No acute territorial infarction, hemorrhage or intracranial mass. Patchy white matter hypodensity likely chronic small vessel ischemic change. The ventricles are nonenlarged. Vascular: No hyperdense vessels.  Carotid vascular calcification Skull: Normal. Negative for fracture or focal lesion. Other: Opacified right maxillary sinus. Mucosal thickening in the ethmoid sinuses CT MAXILLOFACIAL FINDINGS Osseous: Mastoid air cells are clear. Mandibular heads are normally position. No mandibular fracture. Pterygoid plates and zygomatic arches appear intact. No acute nasal bone fracture Orbits: Negative. No traumatic or inflammatory finding. Sinuses: Completely opacified right maxillary sinus. Moderate mucosal thickening in the ethmoid sinuses. No sinus wall fracture. Soft tissues: Moderate to large right periorbital and infraorbital soft tissue hematoma with small foci of gas consistent  with laceration CT CERVICAL SPINE FINDINGS Alignment: Straightening of the cervical spine. No subluxation. Facet alignment within normal limits. Skull base and vertebrae:  Vertebral body heights are maintained. Suspected nondisplaced fracture lucency involving the anterior inferior corner of the C2 vertebral body, best seen on sagittal images. Correlate is seen on axial series 9, image 28. No other discrete fractures are visualized. Soft tissues and spinal canal: No significant prevertebral soft tissue enlargement. Disc levels: No abnormal widening or narrowing of C2-C3 disc space. There are multilevel degenerative changes. Moderate severe disc space narrowing and degenerative change C4-C5, C5-C6, C6-C7 and C7-T1. Facet degenerative changes at multiple levels with foraminal narrowing Upper chest: Negative. Other: None IMPRESSION: 1. No CT evidence for acute intracranial abnormality. Patchy white matter hypodensity likely chronic small vessel ischemic change. 2. Findings consistent with nondisplaced teardrop type fracture involving the anterior inferior aspect of the C2 vertebral body. 3. Multilevel moderate severe degenerative changes Critical Value/emergent results were called by telephone at the time of interpretation on 10/01/2022 at 5:56 pm to provider [facility] , who verbally acknowledged these results. Electronically Signed   By: Donavan Foil M.D.   On: 10/01/2022 17:56   CT Maxillofacial WO CM  Result Date: 10/01/2022 CLINICAL DATA:  Fall with laceration to right cheek EXAM: CT HEAD WITHOUT CONTRAST CT MAXILLOFACIAL WITHOUT CONTRAST CT CERVICAL SPINE WITHOUT CONTRAST TECHNIQUE: Multidetector CT imaging of the head, cervical spine, and maxillofacial structures were performed using the standard protocol without intravenous contrast. Multiplanar CT image reconstructions of the cervical spine and maxillofacial structures were also generated. RADIATION DOSE REDUCTION: This exam was performed according to the departmental dose-optimization program which includes automated exposure control, adjustment of the mA and/or kV according to patient size and/or use of iterative  reconstruction technique. COMPARISON:  CT 08/30/2021 FINDINGS: CT HEAD FINDINGS Brain: No acute territorial infarction, hemorrhage or intracranial mass. Patchy white matter hypodensity likely chronic small vessel ischemic change. The ventricles are nonenlarged. Vascular: No hyperdense vessels.  Carotid vascular calcification Skull: Normal. Negative for fracture or focal lesion. Other: Opacified right maxillary sinus. Mucosal thickening in the ethmoid sinuses CT MAXILLOFACIAL FINDINGS Osseous: Mastoid air cells are clear. Mandibular heads are normally position. No mandibular fracture. Pterygoid plates and zygomatic arches appear intact. No acute nasal bone fracture Orbits: Negative. No traumatic or inflammatory finding. Sinuses: Completely opacified right maxillary sinus. Moderate mucosal thickening in the ethmoid sinuses. No sinus wall fracture. Soft tissues: Moderate to large right periorbital and infraorbital soft tissue hematoma with small foci of gas consistent with laceration CT CERVICAL SPINE FINDINGS Alignment: Straightening of the cervical spine. No subluxation. Facet alignment within normal limits. Skull base and vertebrae: Vertebral body heights are maintained. Suspected nondisplaced fracture lucency involving the anterior inferior corner of the C2 vertebral body, best seen on sagittal images. Correlate is seen on axial series 9, image 28. No other discrete fractures are visualized. Soft tissues and spinal canal: No significant prevertebral soft tissue enlargement. Disc levels: No abnormal widening or narrowing of C2-C3 disc space. There are multilevel degenerative changes. Moderate severe disc space narrowing and degenerative change C4-C5, C5-C6, C6-C7 and C7-T1. Facet degenerative changes at multiple levels with foraminal narrowing Upper chest: Negative. Other: None IMPRESSION: 1. No CT evidence for acute intracranial abnormality. Patchy white matter hypodensity likely chronic small vessel ischemic  change. 2. Findings consistent with nondisplaced teardrop type fracture involving the anterior inferior aspect of the C2 vertebral body. 3. Multilevel moderate severe degenerative changes Critical Value/emergent results were called by telephone  at the time of interpretation on 10/01/2022 at 5:56 pm to provider [facility] , who verbally acknowledged these results. Electronically Signed   By: Donavan Foil M.D.   On: 10/01/2022 17:56   DG Chest 1 View  Result Date: 10/01/2022 CLINICAL DATA:  [place] EXAM: CHEST  1 VIEW COMPARISON:  08/30/2021 FINDINGS: Single frontal view of the chest demonstrates a right chest wall port via internal jugular approach, tip overlying the atriocaval junction. Cardiac silhouette is unremarkable. No acute airspace disease, effusion, or pneumothorax. There are no acute bony abnormalities. IMPRESSION: 1. No acute intrathoracic process. Electronically Signed   By: Randa Ngo M.D.   On: 10/01/2022 17:54   DG Knee Complete 4 Views Left  Result Date: 10/01/2022 CLINICAL DATA:  [place], left leg deformity EXAM: LEFT KNEE - COMPLETE 4+ VIEW COMPARISON:  12/03/2016 FINDINGS: Frontal, bilateral oblique, and cross-table lateral views of the left knee are obtained. No acute displaced fracture, subluxation, or dislocation. Moderate 3 compartmental osteoarthritis greatest in the patellofemoral compartment. Synovial osteochondromatosis again noted. Small joint effusion. Prepatellar soft tissue edema. IMPRESSION: 1. No acute displaced fracture. 2. Moderate osteoarthritis with likely reactive joint effusion. 3. Prepatellar soft tissue edema. Electronically Signed   By: Randa Ngo M.D.   On: 10/01/2022 17:53   DG Hip Unilat With Pelvis 2-3 Views Left  Result Date: 10/01/2022 CLINICAL DATA:  [place], left leg deformity EXAM: DG HIP (WITH OR WITHOUT PELVIS) 2-3V LEFT COMPARISON:  None Available. FINDINGS: Frontal view of the pelvis as well as a frontal and cross-table lateral view of the left  hip are obtained. There is internal rotation of the left hip, with no acute fracture, subluxation, or dislocation. Symmetrical bilateral hip osteoarthritis. Soft tissues are unremarkable. Sacroiliac joints are normal. IMPRESSION: 1. No acute displaced fracture. If fracture remains a clinical concern, CT or MRI be performed. 2. Symmetrical bilateral hip osteoarthritis. Electronically Signed   By: Randa Ngo M.D.   On: 10/01/2022 17:52   CT HIP LEFT WO CONTRAST  Result Date: 10/01/2022 CLINICAL DATA:  Blunt facial trauma, fall striking bed rail. Shortening of left leg. EXAM: CT OF THE LEFT HIP WITHOUT CONTRAST TECHNIQUE: Multidetector CT imaging of the left hip was performed according to the standard protocol. Multiplanar CT image reconstructions were also generated. RADIATION DOSE REDUCTION: This exam was performed according to the departmental dose-optimization program which includes automated exposure control, adjustment of the mA and/or kV according to patient size and/or use of iterative reconstruction technique. COMPARISON:  Radiographs 10/01/2022 FINDINGS: Bones/Joint/Cartilage Moderate to prominent loss of articular space in the left hip with marginal spurring along the acetabulum and femoral head. No fracture or acute bony findings identified. Ligaments Suboptimally assessed by CT. Muscles and Tendons Unremarkable Soft tissues Arterial atherosclerosis. IMPRESSION: 1. No fracture or acute bony findings. 2. Moderate to prominent left hip osteoarthritis. 3. Arterial atherosclerosis. Electronically Signed   By: Van Clines M.D.   On: 10/01/2022 17:48    Microbiology: Results for orders placed or performed during the hospital encounter of 10/01/22  SARS Coronavirus 2 by RT PCR (hospital order, performed in [facility] hospital lab) *cepheid single result test* Anterior Nasal Swab     Status: None   Collection Time: 10/01/22  4:31 PM   Specimen: Anterior Nasal Swab  Result Value Ref Range  Status   SARS Coronavirus 2 by RT PCR NEGATIVE NEGATIVE Final    Comment: (NOTE) SARS-CoV-2 target nucleic acids are NOT DETECTED.  The SARS-CoV-2 RNA is generally detectable in  upper and lower respiratory specimens during the acute phase of infection. The lowest concentration of SARS-CoV-2 viral copies this assay can detect is 250 copies / mL. A negative result does not preclude SARS-CoV-2 infection and should not be used as the sole basis for treatment or other patient management decisions.  A negative result may occur with improper specimen collection / handling, submission of specimen other than nasopharyngeal swab, presence of viral mutation(s) within the areas targeted by this assay, and inadequate number of viral copies (<250 copies / mL). A negative result must be combined with clinical observations, patient history, and epidemiological information.  Fact Sheet for Patients:   https://www.patel.info/  Fact Sheet for Healthcare Providers: https://hall.com/  This test is not yet approved or  cleared by the [place] FDA and has been authorized for detection and/or diagnosis of SARS-CoV-2 by FDA under an Emergency Use Authorization (EUA).  This EUA will remain in effect (meaning this test can be used) for the duration of the COVID-19 declaration under Section 564(b)(1) of the Act, 21 U.S.C. section 360bbb-3(b)(1), unless the authorization is terminated or revoked sooner.  Performed at [facility], [address] [place], [place] [address]     Labs: CBC: Recent Labs  Lab 10/01/22 1631 10/03/22 0359 10/04/22 3403 10/05/22 7096 10/06/22 0937 10/07/22 0714 10/08/22 0559  WBC 12.2*   < > 12.6* 14.8* 17.2* 13.4* 14.0*  NEUTROABS 9.0*  --   --   --   --   --   --   HGB 9.7*   < > 9.5* 8.7* 9.6* 9.7* 8.5*  HCT 31.0*   < > 30.0* 27.7* 30.6* 30.8* 27.4*  MCV 99.0   < > 96.5 96.9 98.1 100.0 98.9  PLT 97*   < > 200  229 272 271 313   < > = values in this interval not displayed.   Basic Metabolic Panel: Recent Labs  Lab 10/03/22 0359 10/04/22 0611 10/05/22 0643 10/06/22 0937 10/07/22 0714 10/08/22 0559  NA 138 136 137 135 135 136  K 4.5 4.3 4.3 4.2 4.3 4.2  CL 113* 108 108 108 108 109  CO2 _0 GLUCOSE 131* 125* 151* 157* 127* 135*  BUN _1 CREATININE 1.11* 1.15* 1.28* 1.41* 1.41* 1.50*  CALCIUM 8.5* 7.8* 7.6* 8.0* 8.2* 7.9*  MG 1.9  --   --   --  2.3  --   PHOS 2.6  --   --   --  3.3  --    Liver Function Tests: No results for input(s): "AST", "ALT", "ALKPHOS", "BILITOT", "PROT", "ALBUMIN" in the last 168 hours. CBG: Recent Labs  Lab 10/07/22 1557 10/07/22 2026 10/08/22 0054 10/08/22 0352 10/08/22 0727  GLUCAP 175* 137* 157* 133* 126*    Discharge time spent: greater than 30 minutes.  This record has been created using Systems analyst. Errors have been sought and corrected,but may not always be located. Such creation errors do not reflect on the standard of care.   Signed: Lorella Nimrod, MD [facility] 10/08/2022

## 2022-10-09 ENCOUNTER — Encounter (HOSPITAL_COMMUNITY): Payer: Self-pay | Admitting: Physical Medicine and Rehabilitation

## 2022-10-09 ENCOUNTER — Telehealth: Payer: Self-pay | Admitting: *Deleted

## 2022-10-09 LAB — BASIC METABOLIC PANEL
Anion gap: 7 (ref 5–15)
BUN: 17 mg/dL (ref 8–23)
CO2: 21 mmol/L — ABNORMAL LOW (ref 22–32)
Calcium: 7.8 mg/dL — ABNORMAL LOW (ref 8.9–10.3)
Chloride: 107 mmol/L (ref 98–111)
Creatinine, Ser: 1.41 mg/dL — ABNORMAL HIGH (ref 0.44–1.00)
GFR, Estimated: 42 mL/min — ABNORMAL LOW (ref >60)
Glucose, Bld: 240 mg/dL — ABNORMAL HIGH (ref 70–99)
Potassium: 4.6 mmol/L (ref 3.5–5.1)
Sodium: 135 mmol/L (ref 135–145)

## 2022-10-09 LAB — GLUCOSE, CAPILLARY
Glucose-Capillary: 184 mg/dL — ABNORMAL HIGH (ref 70–99)
Glucose-Capillary: 182 mg/dL — ABNORMAL HIGH (ref 70–99)
Glucose-Capillary: 188 mg/dL — ABNORMAL HIGH (ref 70–99)

## 2022-10-09 MED ORDER — ENOXAPARIN SODIUM 40 MG/0.4ML IJ SOSY
40.0000 mg | PREFILLED_SYRINGE | INTRAMUSCULAR | Status: DC
  Administered 2022-10-09 – 2022-10-15 (×7): 40 mg via SUBCUTANEOUS
  Filled 2022-10-09 (×7): qty 0.4

## 2022-10-09 MED ORDER — GABAPENTIN 300 MG PO CAPS
600.0000 mg | ORAL_CAPSULE | Freq: Two times a day (BID) | ORAL | Status: DC
  Administered 2022-10-09 – 2022-10-28 (×38): 600 mg via ORAL
  Filled 2022-10-09 (×38): qty 2

## 2022-10-09 MED ORDER — INSULIN ASPART 100 UNIT/ML IJ SOLN
0.0000 [IU] | Freq: Three times a day (TID) | INTRAMUSCULAR | Status: DC
  Administered 2022-10-09 (×3): 3 [IU] via SUBCUTANEOUS
  Administered 2022-10-10: 5 [IU] via SUBCUTANEOUS
  Administered 2022-10-10: 2 [IU] via SUBCUTANEOUS
  Administered 2022-10-10: 3 [IU] via SUBCUTANEOUS
  Administered 2022-10-10: 2 [IU] via SUBCUTANEOUS
  Administered 2022-10-11 (×2): 5 [IU] via SUBCUTANEOUS
  Administered 2022-10-11 – 2022-10-12 (×5): 3 [IU] via SUBCUTANEOUS
  Administered 2022-10-13 (×2): 2 [IU] via SUBCUTANEOUS
  Administered 2022-10-13: 3 [IU] via SUBCUTANEOUS
  Administered 2022-10-13: 2 [IU] via SUBCUTANEOUS
  Administered 2022-10-14: 3 [IU] via SUBCUTANEOUS
  Administered 2022-10-14 (×2): 2 [IU] via SUBCUTANEOUS
  Administered 2022-10-15: 3 [IU] via SUBCUTANEOUS
  Administered 2022-10-15: 5 [IU] via SUBCUTANEOUS
  Administered 2022-10-15: 2 [IU] via SUBCUTANEOUS
  Administered 2022-10-15 – 2022-10-16 (×4): 3 [IU] via SUBCUTANEOUS
  Administered 2022-10-17 (×3): 5 [IU] via SUBCUTANEOUS
  Administered 2022-10-17: 2 [IU] via SUBCUTANEOUS
  Administered 2022-10-18 (×3): 3 [IU] via SUBCUTANEOUS
  Administered 2022-10-19: 2 [IU] via SUBCUTANEOUS
  Administered 2022-10-19: 3 [IU] via SUBCUTANEOUS
  Administered 2022-10-19: 5 [IU] via SUBCUTANEOUS
  Administered 2022-10-20 – 2022-10-21 (×4): 3 [IU] via SUBCUTANEOUS
  Administered 2022-10-21: 2 [IU] via SUBCUTANEOUS
  Administered 2022-10-21 – 2022-10-22 (×2): 5 [IU] via SUBCUTANEOUS
  Administered 2022-10-22: 2 [IU] via SUBCUTANEOUS
  Administered 2022-10-22: 8 [IU] via SUBCUTANEOUS
  Administered 2022-10-22: 5 [IU] via SUBCUTANEOUS
  Administered 2022-10-23: 8 [IU] via SUBCUTANEOUS
  Administered 2022-10-23 (×2): 3 [IU] via SUBCUTANEOUS
  Administered 2022-10-24: 5 [IU] via SUBCUTANEOUS
  Administered 2022-10-24: 3 [IU] via SUBCUTANEOUS
  Administered 2022-10-24: 2 [IU] via SUBCUTANEOUS
  Administered 2022-10-25 – 2022-10-26 (×4): 3 [IU] via SUBCUTANEOUS
  Administered 2022-10-27: 5 [IU] via SUBCUTANEOUS
  Administered 2022-10-27: 3 [IU] via SUBCUTANEOUS
  Administered 2022-10-28: 5 [IU] via SUBCUTANEOUS

## 2022-10-09 NOTE — Progress Notes (Signed)
Physical Therapy Session Note  Patient Details  Name: Janice Short MRN: [id number] Date of Birth: May 13, 1961  Today's Date: 10/09/2022 PT Individual Time: 2831-5176 PT Individual Time Calculation (min): 30 min   Short Term Goals: Week 1:  PT Short Term Goal 1 (Week 1): pt will perform supine to sit w/mod assist of 1 PT Short Term Goal 2 (Week 1): pt will perform bed to/from chair transfer w/LRAD w/mod assist of 1 PT Short Term Goal 3 (Week 1): Pt will request assit w/pressure relief consistently per schedule w/minimal cueing/reminders PT Short Term Goal 4 (Week 1): pt will perform sit to stand in LRAD mod assist of 2  Skilled Therapeutic Interventions/Progress Updates:   Pt initially supine, agreeable to session. Supine to sit w/mod assist of 1 w/hob elevated, therapist blocking pt from sliding due to airbed now in use. In sitting worked on [employer], core strengthening via reaching/leaning/return to upright w/NDT cueing at lumbar spine for isolation/cues to avoid shoulder hiking/facilitate postural control, sitting balance, AAROM Ues + scapular retraction/cueing to isolate/avoid shoulder hiking, and repeated sit to stand in [place]. Able to stand from elevated bed and from perched in stedy w/mod assist of 1 this pm.  W/return to supine pt w/tendency to slide from edge of bed, +2 assist to safely transition from sitting on edge of bed w/stedy to supine.  Pt left supine w/rails up x 3, bed in lowest position, and needs in reach.   Therapy Documentation Precautions:  Precautions Precautions: Cervical, Fall Precaution Comments: [place] Required Braces or Orthoses: Cervical Brace Cervical Brace: Hard collar Restrictions Weight Bearing Restrictions: No    Therapy/Group: Individual Therapy Callie Fielding, [place] 10/09/2022, 3:50 PM

## 2022-10-09 NOTE — Telephone Encounter (Signed)
Can you find out how long rehab will be? We can push chemo out by 1 month

## 2022-10-09 NOTE — Progress Notes (Signed)
Inpatient Rehabilitation  Patient information reviewed and entered into eRehab system by Sehaj Mcenroe Asha Grumbine, OTR/L, Rehab Quality Coordinator.   Information including medical coding, functional ability and quality indicators will be reviewed and updated through discharge.   

## 2022-10-09 NOTE — Consult Note (Signed)
WOC Nurse Consult Note: Patient receiving care in [facility] [phone]. Primary RN, Ed, present for wound assessment. Reason for Consult: patient with stage 3 on coccyx, please evaluate Wound type: stage 3 PI to coccyx Pressure Injury POA: Yes Measurement: 2.5 cm x 1.8 cm x 0.2 cm Wound bed: 90% pink, 10% yellow with a wound border that is white from maceration Drainage (amount, consistency, odor) no odor Periwound: intact Dressing procedure/placement/frequency: Cleanse coccyx wound with saline. Pat dry. Place a small piece of Aquacel Advantage Kellie Simmering [phone]) to fit over the wound, top with a foam dressing. Change daily and prn soilage.  Please note:  It would be very helpful to evaluate patient for a ROHO cushion for her wheelchair, and provide if possible/indicated.  Monitor the wound area(s) for worsening of condition such as: Signs/symptoms of infection,  Increase in size,  Development of or worsening of odor, Development of pain, or increased pain at the affected locations.  Notify the medical team if any of these develop.  Thank you for the consult.  Discussed plan of care with the patient and bedside nurse.  [place] nurse will not follow at this time.  Please re-consult the [place] team if needed.  Val Riles, RN, MSN, CWOCN, CNS-BC, pager [phone]

## 2022-10-09 NOTE — Progress Notes (Signed)
Occupational Therapy Session Note  Patient Details  Name: Janice Short MRN: [id number] Date of Birth: November 18, 1961  Today's Date: 10/09/2022 OT Individual Time: 1340-1410 OT Individual Time Calculation (min): 30 min   Short Term Goals: Week 1:  OT Short Term Goal 1 (Week 1): Patient will maintain sitting balance at EOB with no more than min A with dynamic task OT Short Term Goal 2 (Week 1): Patient will be able to reach under R arm to wash armpit OT Short Term Goal 3 (Week 1): Patient will complete toilet transfer with mod A +2  Skilled Therapeutic Interventions/Progress Updates:    Pt greeted semi-reclined in bed awake and agreeable to OT treatment session. Pt completed bed mobility with mod A. Worked on sit<.stand in [place] with Max A +2 and facilitation for full hip and trunk extension-mMICH improved extension this afternoon compareed to morning eval. L UE ROM and there-ex while perched in [place] seat. Nursing traded out regular bed for air mattress bed while pt worked on sitting upright in [place]. Pt unable to achieve hip and trunk extension on next attempt to stand from [facility] seat  requiring max +2 to get hips up enough to remove paddles. Pt returned to bed with mod A and left in sidelying for pressure relief. C-collar on at EOB.   Therapy Documentation Precautions:  Precautions Precautions: Cervical, Fall Precaution Comments: [place] Required Braces or Orthoses: Cervical Brace Cervical Brace: Hard collar Restrictions Weight Bearing Restrictions: No Pain: Pain Assessment Pain Scale: 0-10 Pain Score: 7  Pain Type: Acute pain Pain Location: Generalized Pain Descriptors / Indicators: Aching;Cramping Pain Frequency: Constant Pain Onset: On-going Patients Stated Pain Goal: 4 Pain Intervention(s): Medication (See eMAR)   Therapy/Group: Individual Therapy  Valma Cava 10/09/2022, 2:10 PM

## 2022-10-09 NOTE — Progress Notes (Signed)
Inpatient Rehabilitation Care Coordinator Assessment and Plan Patient Details  Name: Janice Short MRN: [id number] Date of Birth: 1961-08-24  Today's Date: 10/09/2022  Hospital Problems: Principal Problem:   Acute incomplete quadriplegia ([place]) Active Problems:   Intrahepatic cholangiocarcinoma ([place])   Central cord syndrome ([place])   Neurogenic bowel   Pressure ulcer, stage 3 ([place])  Past Medical History:  Past Medical History:  Diagnosis Date   Allergy    Asthma    Carpal tunnel syndrome on left    Cervical radiculitis    Chronic osteoarthritis    Corns and callosity    Depression    Dermatophytosis of foot    Diabetes mellitus without complication ([place])    Gout    Hyperlipidemia    Hypertension    Impingement syndrome of left shoulder    Intrahepatic cholangiocarcinoma (H[facility])    Lumbosacral neuritis    Microalbuminuria    Obesity    Supraventricular tachycardia 07/01/2015   SVT (supraventricular tachycardia)    Tenosynovitis of wrist    Vitamin D deficiency    Past Surgical History:  Past Surgical History:  Procedure Laterality Date   ABDOMINAL HYSTERECTOMY     ANTERIOR CERVICAL DECOMP/DISCECTOMY FUSION N/A 10/02/2022   Procedure: ANTERIOR CERVICAL DECOMPRESSION/DISCECTOMY FUSION 1 LEVEL;  Surgeon: Meade Maw, MD;  Location: [facility] ORS;  Service: Neurosurgery;  Laterality: N/A;  ACDF C4-5.   COLONOSCOPY WITH PROPOFOL N/A 05/06/2021   Procedure: COLONOSCOPY WITH PROPOFOL;  Surgeon: Lucilla Lame, MD;  Location: [facility] ENDOSCOPY;  Service: Endoscopy;  Laterality: N/A;   ESOPHAGOGASTRODUODENOSCOPY (EGD) WITH PROPOFOL N/A 05/06/2021   Procedure: ESOPHAGOGASTRODUODENOSCOPY (EGD) WITH PROPOFOL;  Surgeon: Lucilla Lame, MD;  Location: [facility] ENDOSCOPY;  Service: Endoscopy;  Laterality: N/A;   PORTA CATH INSERTION N/A 07/17/2021   Procedure: PORTA CATH INSERTION;  Surgeon: Algernon Huxley, MD;  Location: [facility] CV LAB;  Service: Cardiovascular;  Laterality: N/A;   Social  History:  reports that she has been smoking cigarettes. She has never used smokeless tobacco. She reports that she does not currently use alcohol. She reports that she does not currently use drugs after having used the following drugs: Benzodiazepines.  Family / Support Systems Marital Status: Single Patient Roles: Parent, Other (Comment) (sibling and friend) Children: Lindsay-daughter [phone]  Oval Linsey [phone] both live in [place] area Other Supports: Deborah-sister [phone] Anticipated Caregiver: daughter and son can assist some with transportation since daughter does not drive Ability/Limitations of Caregiver: Daughter works from [employer] and has a 48 yo son who is autistic. Daughter does not drive. Son drvies and lives in [place] Caregiver Availability: 24/7 Family Dynamics: Close with both children who are involved and supportive. She has friends and a sister who will check on her and make sure she has what she needs  Social History Preferred language: English Religion: None Cultural Background: No issues Education: [place] - How often do you need to have someone help you when you read instructions, pamphlets, or other written material from your doctor or pharmacy?: Never Writes: Yes Employment Status: Disabled Date Retired/Disabled/Unemployed: back issues and has carpel tunnel Legal History/Current Legal Issues: No issues Guardian/Conservator: None-according to MD pt is capable of making her own decisions while here. Her daughter is involved but will need to be brought here to visit Mom   Abuse/Neglect Abuse/Neglect Assessment Can Be Completed: Yes Physical Abuse: Denies Verbal Abuse: Denies Sexual Abuse: Denies Exploitation of patient/patient's resources: Denies Self-Neglect: Denies  Patient response to: Social Isolation - How often do you feel  lonely or isolated from those around you?: Never  Emotional Status Pt's affect, behavior and  adjustment status: Pt is motivated to recover from her fall and become independent again. She was independent and driving prior to admission and was enjoying her life. She took herself to chemo treatments and visited friends and family. Recent Psychosocial Issues: other health issues-has  bile duct cancer and is getting chemo every other week at [facility] Psychiatric History: History of depression and takes medications which she finds helpful but would benefit from seeing neruo-psych while here. Will place her on the list to be seen Substance Abuse History: Tobacco has a patch now to assist her with tihs. Aware of the health risks and not sure if will stop now.  Patient / Family Perceptions, Expectations & Goals Pt/Family understanding of illness & functional limitations: Pt can explain hr neck issues and loss of function. She is hopeful she will do well here and recover. She has made some progress already. She does talk with the MD daily when rounding and feels understands her treatment plan moving forward. Premorbid pt/family roles/activities: Mom, grandmother, retiree, cancer pt, friend, sister, etc Anticipated changes in roles/activities/participation: resume Pt/family expectations/goals: Pt states: " I hope to do well and get to where I can move on my own." " I don't want anyone to have to help me."  [employer]: Other (Comment) ([place] for bi-weekly chemo treatments) Premorbid Home Care/DME Agencies: Other (Comment) ([facility] and [facility]) Transportation available at discharge: self and son does drive Is the patient able to respond to transportation needs?: Yes In the past 12 months, has lack of transportation kept you from medical appointments or from getting medications?: No In the past 12 months, has lack of transportation kept you from meetings, work, or from getting things needed for daily living?: No Resource referrals recommended: Neuropsychology  Discharge  Planning Living Arrangements: Children, Other relatives Support Systems: Children, Other relatives, Friends/neighbors Type of Residence: Private residence Insurance Resources: Multimedia programmer (specify), Medicaid (specify county) Primary school teacher) Financial Resources: SSD, Family Support Financial Screen Referred: No Living Expenses: Medical laboratory scientific officer Management: Patient Does the patient have any problems obtaining your medications?: No Home Management: patient and daughter Patient/Family Preliminary Plans: Return home with daughter and grandson daughter is there she works from home. Not sure how much she can assist while she is working. Will find this out. Pt wants to be here a short time but do not feel she fully understands her injuries and will ask MD/PA to explain to her. Care Coordinator Barriers to Discharge: Neurogenic Bowel & Bladder, Pending chemo/radiation, Insurance for SNF coverage Care Coordinator Anticipated Follow Up Needs: HH/OP, Support Group  Clinical Impression Pleasant female who is still processing her injuries and fall which caused her C2 fracture. Her daughter and son are involved and daughter along with her 47 yo sin lives with patient. Pt does have a ramp into the home and was the driver daughter does not drive. Will await therapy team evaluations and work on discharge needs. Will ask PA/MD regarding next chemo since she was suppose to get it the Friday after she fell.  Elease Hashimoto 10/09/2022, 11:50 AM

## 2022-10-09 NOTE — Progress Notes (Signed)
[place] [facility] Individual Statement of Services  Patient Name:  Janice Short  Date:  10/09/2022  Welcome to the [facility].  Our goal is to provide you with an individualized program based on your diagnosis and situation, designed to meet your specific needs.  With this comprehensive rehabilitation program, you will be expected to participate in at least 3 hours of rehabilitation therapies Monday-Friday, with modified therapy programming on the weekends.  Your rehabilitation program will include the following services:  Physical Therapy (PT), Occupational Therapy (OT), 24 hour per day rehabilitation nursing, Therapeutic Recreaction (TR), Neuropsychology, Care Coordinator, Rehabilitation Medicine, Nutrition Services, and Pharmacy Services  Weekly team conferences will be held on Tuesday to discuss your progress.  Your Inpatient Rehabilitation Care Coordinator will talk with you frequently to get your input and to update you on team discussions.  Team conferences with you and your family in attendance may also be held.  Expected length of stay:  21 days  Overall anticipated outcome: CGA-Min level  Depending on your progress and recovery, your program may change. Your Inpatient Rehabilitation Care Coordinator will coordinate services and will keep you informed of any changes. Your Inpatient Rehabilitation Care Coordinator's name and contact numbers are listed  below.  The following services may also be recommended but are not provided by the [facility] will be made to provide these services after discharge if needed.  Arrangements include referral to agencies that provide these services.  Your insurance has been verified to be:  UHC-medicare & medicaid Your primary doctor is:  Steele Sizer  Pertinent information will be shared  with your doctor and your insurance company.  Inpatient Rehabilitation Care Coordinator:  Ovidio Kin, [place] or Emilia Beck  Information discussed with and copy given to patient by: Elease Hashimoto, 10/09/2022, 11:53 AM

## 2022-10-09 NOTE — Plan of Care (Signed)
Wound Plan   Braden Score: 15  Sensory: 4   Moisture: 3  Activity: 2  Mobility: 2  Nutrition: 3  Friction: 1   Wounds present: Pressure Injury 10/06/22 Stage III to right buttocks (picture in EPIC) present on admission  Interventions: Transport planner [place] consult  J cushion TIS chair Instructed on boosting   Attendees/Contributors:  Tacy Learn, RN Rozetta Nunnery, RN [place] Ormond Doe, OT Val Riles, RN

## 2022-10-09 NOTE — IPOC Note (Signed)
Overall Plan of Care [facility]) Patient Details Name: Janice Short MRN: [id number] DOB: 1961-04-11  Admitting Diagnosis: Acute incomplete quadriplegia [facility])  Hospital Problems: Principal Problem:   Acute incomplete quadriplegia ([place]) Active Problems:   Intrahepatic cholangiocarcinoma ([place])   Central cord syndrome ([place])   Neurogenic bowel   Pressure ulcer, stage 3 ([place])     Functional Problem List: Nursing Bladder, Bowel, Endurance, Medication Management, Pain, Safety, Skin Integrity  PT Balance, Endurance, Pain, Safety, Sensory, Skin Integrity  OT Balance, Endurance, Motor, Nutrition, Pain, Safety, Sensory, Skin Integrity  SLP    TR         Basic ADL's: OT Eating, Grooming, Bathing, Dressing, Toileting     Advanced  ADL's: OT       Transfers: PT Bed Mobility, Bed to Chair, Car, Manufacturing systems engineer, Metallurgist: PT Ambulation, Emergency planning/management officer     Additional Impairments: OT Fuctional Use of Upper Extremity  SLP        TR      Anticipated Outcomes Item Anticipated Outcome  Self Feeding set-up A  Swallowing      Basic self-care  Min A/supervision  Toileting  Min A   Bathroom Transfers Min A  Bowel/Bladder  continent of bowel and bladder with min assist  Transfers  mod I  Locomotion  Household ambulation mod I  Communication     Cognition     Pain  pain less than or equal to 4/10 with prn pain meds  Safety/Judgment  free from injury/falls and making appropriate safety decisins   Therapy Plan: PT Intensity: Minimum of 1-2 x/day ,45 to 90 minutes PT Frequency: 5 out of 7 days PT Duration Estimated Length of Stay: 21 days OT Intensity: Minimum of 1-2 x/day, 45 to 90 minutes OT Frequency: 5 out of 7 days OT Duration/Estimated Length of Stay: 3 weeks     Team Interventions: Nursing Interventions Patient/Family Education, Bladder Management, Bowel Management, Pain Management, Medication Management, Skin Care/Wound Management,  Psychosocial Support  PT interventions Ambulation/gait training, Training and development officer, Cognitive remediation/compensation, Community reintegration, Functional electrical stimulation, DME/adaptive equipment instruction, Disease management/prevention, Discharge planning, Functional mobility training, Psychosocial support, Therapeutic Activities, Neuromuscular re-education, Skin care/wound management, Therapeutic Exercise, Wheelchair propulsion/positioning, Pain management, Splinting/orthotics, UE/LE Strength taining/ROM, Patient/family education, UE/LE Coordination activities  OT Interventions Training and development officer, Cognitive remediation/compensation, Community reintegration, Discharge planning, Disease mangement/prevention, DME/adaptive equipment instruction, Functional electrical stimulation, Functional mobility training, Neuromuscular re-education, Pain management, Patient/family education, Psychosocial support, Self Care/advanced ADL retraining, Skin care/wound managment, Splinting/orthotics, Therapeutic Activities, Therapeutic Exercise, UE/LE Strength taining/ROM, UE/LE Coordination activities, Visual/perceptual remediation/compensation, Wheelchair propulsion/positioning  SLP Interventions    TR Interventions    SW/CM Interventions Discharge Planning, Psychosocial Support, Patient/Family Education   Barriers to Discharge MD  Medical stability, Home enviroment access/loayout, Wound care, Lack of/limited family support, Weight, and Weight bearing restrictions  Nursing Decreased caregiver support, Incontinence, Wound Care, Weight 1 level home with ramp entrance  PT Inaccessible home environment, Decreased caregiver support, Home environment access/layout, Neurogenic Bowel & Bladder, Weight, Other (comments) (cervical precs, HCC)    OT      SLP      SW Neurogenic Bowel & Bladder, Pending chemo/radiation, Insurance for SNF coverage     Team Discharge Planning: Destination: PT-Home  ,OT- Home , SLP-  Projected Follow-up: PT-Other (comment) (TBD), OT-  Home health OT, SLP-  Projected Equipment Needs: PT-To be determined, OT- To be determined, SLP-  Equipment Details: PT- , OT-TBD Patient/family involved in discharge planning:  PT- Patient,  OT-Patient, SLP-   MD ELOS: ~ 3 weeks Medical Rehab Prognosis:  Good Assessment: The patient has been admitted for CIR therapies with the diagnosis of incomplete C4 quadriplegia- functionally looks like central cord syndrome. The team will be addressing functional mobility, strength, stamina, balance, safety, adaptive techniques and equipment, self-care, bowel and bladder mgt, patient and caregiver education, . Goals have been set at mod I to min A. Anticipated discharge destination is home.        See Team Conference Notes for weekly updates to the plan of care

## 2022-10-09 NOTE — Progress Notes (Signed)
PROGRESS NOTE   Subjective/Complaints:  Didn't require a cath/ was able to void multiple times yesterday.   Also, got cleaned out after Sorbitol- had 2 very large BM's.   Pain 8-9/10 and thginks might be due for pain meds soon.   Said she "spits up" some- usually smokes 3-5 cigarettes/day and trying to quit. Has been a chronic issue.    ROS:  Pt denies SOB, abd pain, CP, N/V/C/D, and vision changes   Objective:   US Venous Img Lower Bilateral (DVT)  Result Date: 10/07/2022 CLINICAL DATA:  Injury, fall, fever EXAM: BILATERAL LOWER EXTREMITY VENOUS DOPPLER ULTRASOUND TECHNIQUE: Gray-scale sonography with graded compression, as well as color Doppler and duplex ultrasound were performed to evaluate the lower extremity deep venous systems from the level of the common femoral vein and including the common femoral, femoral, profunda femoral, popliteal and calf veins including the posterior tibial, peroneal and gastrocnemius veins when visible. Spectral Doppler was utilized to evaluate flow at rest and with distal augmentation maneuvers in the common femoral, femoral and popliteal veins. COMPARISON:  None Available. FINDINGS: RIGHT LOWER EXTREMITY Common Femoral Vein: No evidence of thrombus. Normal compressibility, respiratory phasicity and response to augmentation. Saphenofemoral Junction: No evidence of thrombus. Normal compressibility and flow on color Doppler imaging. Profunda Femoral Vein: No evidence of thrombus. Normal compressibility and flow on color Doppler imaging. Femoral Vein: No evidence of thrombus. Normal compressibility, respiratory phasicity and response to augmentation. Popliteal Vein: No evidence of thrombus. Normal compressibility, respiratory phasicity and response to augmentation. Calf Veins: No evidence of thrombus. Normal compressibility and flow on color Doppler imaging. LEFT LOWER EXTREMITY Common Femoral Vein: No  evidence of thrombus. Normal compressibility, respiratory phasicity and response to augmentation. Saphenofemoral Junction: No evidence of thrombus. Normal compressibility and flow on color Doppler imaging. Profunda Femoral Vein: No evidence of thrombus. Normal compressibility and flow on color Doppler imaging. Femoral Vein: No evidence of thrombus. Normal compressibility, respiratory phasicity and response to augmentation. Popliteal Vein: No evidence of thrombus. Normal compressibility, respiratory phasicity and response to augmentation. Calf Veins: No evidence of thrombus. Normal compressibility and flow on color Doppler imaging. IMPRESSION: No evidence of deep venous thrombosis in either lower extremity. Electronically Signed   By: Jerilynn Mages.  Shick M.D.   On: 10/07/2022 18:27   DG Chest 2 View  Result Date: 10/07/2022 CLINICAL DATA:  Fever EXAM: CHEST - 2 VIEW COMPARISON:  10/01/2022 FINDINGS: RIGHT jugular Port-A-Cath with tip projecting over SVC. Normal heart size, mediastinal contours, and pulmonary vascularity. Atherosclerotic calcification aorta. Lungs clear. No infiltrate, pleural effusion, or pneumothorax. Motion artifacts degrade lateral view. Bones demineralized. IMPRESSION: No acute abnormalities. Aortic Atherosclerosis (ICD10-I70.0). Electronically Signed   By: Lavonia Dana M.D.   On: 10/07/2022 17:49   Recent Labs    10/07/22 0714 10/08/22 0559  WBC 13.4* 14.0*  HGB 9.7* 8.5*  HCT 30.8* 27.4*  PLT 271 313   Recent Labs    10/08/22 0559 10/09/22 0943  NA 136 135  K 4.2 4.6  CL 109 107  CO2 23 21*  GLUCOSE 135* 240*  BUN 22 17  CREATININE 1.50* 1.41*  CALCIUM 7.9* 7.8*    Intake/Output Summary (Last  24 hours) at 10/09/2022 1322 Last data filed at 10/09/2022 1300 Gross per 24 hour  Intake 360 ml  Output 1510 ml  Net -1150 ml     Pressure Injury 10/06/22 Buttocks Right Stage 3 -  Full thickness tissue loss. Subcutaneous fat may be visible but bone, tendon or muscle are NOT  exposed. see picture in Epic (Active)  10/06/22 1130  Location: Buttocks  Location Orientation: Right  Staging: Stage 3 -  Full thickness tissue loss. Subcutaneous fat may be visible but bone, tendon or muscle are NOT exposed.  Wound Description (Comments): see picture in Epic  Present on Admission: Yes    Physical Exam: Vital Signs Blood pressure 132/65, pulse 95, temperature 98 F (36.7 C), resp. rate 16, height 5\' 7"  (1.702 m), weight 82.4 kg, SpO2 99 %.    General: awake, alert, appropriate, brighter affect; not cold today-  NAD HENT: conjugate gaze; oropharynx moist- R cheek wound- Sutures look good CV: regular rate; no JVD Pulmonary: CTA B/L; no W/R/R- good air movement GI: soft, NT, ND, (+)BS- not distended anymore and normoactive BS Psychiatric: appropriate- brighter Neurological: Ox3 Genitourinary:    Comments: Vulva has no rashes/wounds Musculoskeletal:     Comments: RUE- Bicep 4+/5; triceps 4/5; WE 4-/5; grip 4-/5; FA 4-/5 LUE- Biceps 4-/5; WE 3-/5; Triceps 2/5; Grip 2-/5 and FA 1/5 RLE- HF, KE 4/5; DF/PF and EHL 4+/5 LLE- DF/PF 4/5 and EHL 4-/5- pt unable to let me uncover her again, she's so cold to test rest of LLE  Skin:    General: Skin is warm and dry.     Comments: Skin laceration to left facial cheek with stitches in place.  ACDF site clean and dry Stage III- superficial stage III 1.3 x 1.0 x 0.5 cm on coccyx with a few tiny spots that are more superficial around it Port not accessed- R chest Dry skin on feet- some bunions and hammertoes R forearm IV- looks OK  Neurological:     Mental Status: She is alert.     Comments: Patient is alert and oriented x3.  Follows full commands. Ox3 No increased tone or clonus RLE/LLE Assessment/Plan: 1. Functional deficits which require 3+ hours per day of interdisciplinary therapy in a comprehensive inpatient rehab setting. Physiatrist is providing close team supervision and 24 hour management of active medical problems  listed below. Physiatrist and rehab team continue to assess barriers to discharge/monitor patient progress toward functional and medical goals  Care Tool:  Bathing              Bathing assist       Upper Body Dressing/Undressing Upper body dressing        Upper body assist      Lower Body Dressing/Undressing Lower body dressing            Lower body assist       Toileting Toileting    Toileting assist       Transfers Chair/bed transfer  Transfers assist     Chair/bed transfer assist level: 2 Helpers     Locomotion Ambulation   Ambulation assist   Ambulation activity did not occur: Safety/medical concerns          Walk 10 feet activity   Assist  Walk 10 feet activity did not occur: Safety/medical concerns        Walk 50 feet activity   Assist Walk 50 feet with 2 turns activity did not occur: Safety/medical concerns  Walk 150 feet activity   Assist Walk 150 feet activity did not occur: Safety/medical concerns         Walk 10 feet on uneven surface  activity   Assist Walk 10 feet on uneven surfaces activity did not occur: Safety/medical concerns         Wheelchair     Assist Is the patient using a wheelchair?: Yes Type of Wheelchair: Manual    Wheelchair assist level: Dependent - Patient 0%      Wheelchair 50 feet with 2 turns activity    Assist        Assist Level: Dependent - Patient 0%   Wheelchair 150 feet activity     Assist      Assist Level: Dependent - Patient 0%   Blood pressure 132/65, pulse 95, temperature 98 F (36.7 C), resp. rate 16, height 5\' 7"  (1.702 m), weight 82.4 kg, SpO2 99 %.  Medical Problem List and Plan: 1. 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  Status post ACDF C4-5 10/02/2022 per  Dr.Yarbrough.  Cervical collar when out of bed applied in sitting position             -patient may  shower- monitor skin             -ELOS/Goals: 3-4 weeks- min A hopefully  First day of evaluations- con't CIR- PT and OT 2.  Antithrombotics: -DVT/anticoagulation:  Pharmaceutical: Lovenox.  Venous Doppler studies 10/07/2022 negative             -antiplatelet therapy: N/A 3. Pain Management: Neurontin 300 mg twice daily, Robaxin and oxycodone as needed  11/3- pain 8-9/10 this Am, but was due for pain meds- con't regimen for now- will increase to 600 mg BID but no higher due to some elevation/Cr of 1.4-1.5 4. Mood/Behavior/Sleep: Lexapro 25 mg daily             -antipsychotic agents: N/A 5. Neuropsych/cognition: This patient is capable of making decisions on her own behalf. 6. Skin/Wound Care left facial cheek laceration: Status post sutures received in ED.  Routine skin checks 7. Fluids/Electrolytes/Nutrition: Routine in and outs with follow-up chemistries 8.  Diabetes mellitus.  Hemoglobin A1c 5.9.  Currently maintained on Semglee 8 units daily.  Patient on Tresiba 8 units daily prior to admission 9.  AKI on CKD stage III.  Baseline creatinine 1.18-1.26.  Follow-up chemistries  11/3- Cr up to 1.41- will recheck Monday and push fluids 10.  Acute on chronic anemia.  Follow-up CBC 11.  Intrahepatic cholangiocarcinoma.  Patient on chemotherapy followed by Dr. Randa Evens.  Follow-up outpatient- port not accessed- missed last chemo treatment-her car broke down- will need to be here 3-4 weeks.   11/3- supposedly got chemo q2 weeks- will need to check with Oncology what to do while here. D/w PA 12.  Obesity.  BMI 27.62.  Dietary follow-up 13.  Hypertension.  Norvasc 2.5 mg daily.  Monitor with increased mobility 14.  History of tobacco use/COPD.  Continue inhalers as indicated 15.  Hyperlipidemia.  Lipitor 16. Complicated UTI.Empiric IV Rocephin 10/08/2022 changed to keflex for 7 days- pending  culture, hopefully- will monitor 17. Neurogenic bladder- will do bladder scans q6 hours and cath if volumes >350cc 18. Neurogenic bowel and constipation- will get cleaned out- Sorbitol 45cc now and follow with SSE if no results- might need bowel  program- not clear yet.   11/3- cleaned out- was continent 19. Stage III coccyx pressure ulcer- on admission- will automatically get WOC consult and determine if needs air mattress, place foam for the moment and turn often- will order turning q2 hours  11/3- will make [place]- seen by WOC- started aquacel with foam dressing to change daily.  20. Shivering/cold- will order kpad to warm her up and to help back pain.   11/3- resolved   I spent a total of  41  minutes on total care today- >50% coordination of care- due to d/w PA and nursing about her Stage III wound on coccyx and chemo treatment-      LOS: 1 days A FACE TO FACE EVALUATION WAS PERFORMED  Janice Short 10/09/2022, 1:22 PM

## 2022-10-09 NOTE — Plan of Care (Signed)
  Problem: RH Balance Goal: LTG: Patient will maintain dynamic sitting balance (OT) Description: LTG:  Patient will maintain dynamic sitting balance with assistance during activities of daily living (OT) Flowsheets (Taken 10/09/2022 1553) LTG: Pt will maintain dynamic sitting balance during ADLs with: Supervision/Verbal cueing Goal: LTG Patient will maintain dynamic standing with ADLs (OT) Description: LTG:  Patient will maintain dynamic standing balance with assist during activities of daily living (OT)  Flowsheets (Taken 10/09/2022 1553) LTG: Pt will maintain dynamic standing balance during ADLs with: Minimal Assistance - Patient > 75%   Problem: RH Eating Goal: LTG Patient will perform eating w/assist, cues/equip (OT) Description: LTG: Patient will perform eating with assist, with/without cues using equipment (OT) Flowsheets (Taken 10/09/2022 1553) LTG: Pt will perform eating with assistance level of: Supervision/Verbal cueing   Problem: RH Grooming Goal: LTG Patient will perform grooming w/assist,cues/equip (OT) Description: LTG: Patient will perform grooming with assist, with/without cues using equipment (OT) Flowsheets (Taken 10/09/2022 1553) LTG: Pt will perform grooming with assistance level of: Supervision/Verbal cueing   Problem: RH Bathing Goal: LTG Patient will bathe all body parts with assist levels (OT) Description: LTG: Patient will bathe all body parts with assist levels (OT) Flowsheets (Taken 10/09/2022 1553) LTG: Pt will perform bathing with assistance level/cueing: Minimal Assistance - Patient > 75%   Problem: RH Dressing Goal: LTG Patient will perform upper body dressing (OT) Description: LTG Patient will perform upper body dressing with assist, with/without cues (OT). Flowsheets (Taken 10/09/2022 1553) LTG: Pt will perform upper body dressing with assistance level of: Supervision/Verbal cueing Goal: LTG Patient will perform lower body dressing w/assist  (OT) Description: LTG: Patient will perform lower body dressing with assist, with/without cues in positioning using equipment (OT) Flowsheets (Taken 10/09/2022 1553) LTG: Pt will perform lower body dressing with assistance level of: Minimal Assistance - Patient > 75%   Problem: RH Toileting Goal: LTG Patient will perform toileting task (3/3 steps) with assistance level (OT) Description: LTG: Patient will perform toileting task (3/3 steps) with assistance level (OT)  Flowsheets (Taken 10/09/2022 1553) LTG: Pt will perform toileting task (3/3 steps) with assistance level: Minimal Assistance - Patient > 75%   Problem: RH Toilet Transfers Goal: LTG Patient will perform toilet transfers w/assist (OT) Description: LTG: Patient will perform toilet transfers with assist, with/without cues using equipment (OT) Flowsheets (Taken 10/09/2022 1553) LTG: Pt will perform toilet transfers with assistance level of: Minimal Assistance - Patient > 75%   Problem: RH Tub/Shower Transfers Goal: LTG Patient will perform tub/shower transfers w/assist (OT) Description: LTG: Patient will perform tub/shower transfers with assist, with/without cues using equipment (OT) Flowsheets (Taken 10/09/2022 1553) LTG: Pt will perform tub/shower stall transfers with assistance level of: Minimal Assistance - Patient > 75%

## 2022-10-09 NOTE — Evaluation (Signed)
Physical Therapy Assessment and Plan  Patient Details  Name: Janice Short MRN: [id number] Date of Birth: 08-27-1961  PT Diagnosis: Abnormality of gait, Pain in LLE, and Quadriplegia Rehab Potential: Good ELOS: 21 days   Today's Date: 10/09/2022 PT Individual Time: 9628-3662 and 800-815 PT Individual Time Calculation (min): 60 min  and 15 min  Hospital Problem: Principal Problem:   Acute incomplete quadriplegia ([place]) Active Problems:   Intrahepatic cholangiocarcinoma ([place])   Central cord syndrome ([place])   Neurogenic bowel   Pressure ulcer, stage 3 ([place])   Past Medical History:  Past Medical History:  Diagnosis Date   Allergy    Asthma    Carpal tunnel syndrome on left    Cervical radiculitis    Chronic osteoarthritis    Corns and callosity    Depression    Dermatophytosis of foot    Diabetes mellitus without complication ([place])    Gout    Hyperlipidemia    Hypertension    Impingement syndrome of left shoulder    Intrahepatic cholangiocarcinoma ([facility])    Lumbosacral neuritis    Microalbuminuria    Obesity    Supraventricular tachycardia 07/01/2015   SVT (supraventricular tachycardia)    Tenosynovitis of wrist    Vitamin D deficiency    Past Surgical History:  Past Surgical History:  Procedure Laterality Date   ABDOMINAL HYSTERECTOMY     ANTERIOR CERVICAL DECOMP/DISCECTOMY FUSION N/A 10/02/2022   Procedure: ANTERIOR CERVICAL DECOMPRESSION/DISCECTOMY FUSION 1 LEVEL;  Surgeon: Meade Maw, MD;  Location: [facility] ORS;  Service: Neurosurgery;  Laterality: N/A;  ACDF C4-5.   COLONOSCOPY WITH PROPOFOL N/A 05/06/2021   Procedure: COLONOSCOPY WITH PROPOFOL;  Surgeon: Lucilla Lame, MD;  Location: [facility] ENDOSCOPY;  Service: Endoscopy;  Laterality: N/A;   ESOPHAGOGASTRODUODENOSCOPY (EGD) WITH PROPOFOL N/A 05/06/2021   Procedure: ESOPHAGOGASTRODUODENOSCOPY (EGD) WITH PROPOFOL;  Surgeon: Lucilla Lame, MD;  Location: [facility] ENDOSCOPY;  Service: Endoscopy;  Laterality: N/A;   PORTA  CATH INSERTION N/A 07/17/2021   Procedure: PORTA CATH INSERTION;  Surgeon: Algernon Huxley, MD;  Location: [place] CV LAB;  Service: Cardiovascular;  Laterality: N/A;    Assessment & Plan Clinical Impression:  Jocabed Cheese. Somes is a 61 year old right-handed female with history of diabetes mellitus, CKD stage III, chronic anemia, chronic bronchitis, intrahepatic cholangiocarcinoma on chemotherapy last infused 09/18/2022 followed by [name], hypertension, hyperlipidemia, gout, obesity with BMI 27.62, tobacco use, carpal tunnel syndrome on left.  Per chart review patient lives with daughter and 66-year-old grandson.  Mobile home with ramped entrance.  Reportedly independent prior to admission.  Presented to [facility] 10/01/2022 following mechanical fall when she tripped, fell backwards striking her face on a railing.  She was unable to help herself up.  She sustained a laceration to the left cheek and complained of left hip pain and bilateral upper extremity numbness.  Admission chemistries unremarkable except potassium 5.7, creatinine 1.30, GFR 46, calcium 7.9, glucose 243, hemoglobin 9.7, WBC 12,200.  Patient's laceration to left cheek was sutured in the ED.  Cranial CT scan negative for intracranial abnormality.  CT maxillofacial negative.  CT left hip negative for fracture.  CT cervical spine findings consistent with nondisplaced teardrop type fracture involving the anterior inferior aspect of the C2 vertebral body as well as severe stenosis C4-5 with myelomalacia.  Neurosurgery consulted underwent anterior cervical discectomy decompression and fusion/cervical instrumentation C4-5 10/02/2022 per Dr. Izora Ribas.  Hard cervical collar when out of bed and can be applied in the sitting position.  Hospital course AKI on  CKD stage III baseline serum creatinine 1.18 > 1.26.  Noted hyperkalemia 5.7 responded well to [facility] with latest potassium 4.2 and creatinine 1.50.  Leukocytosis 21,600 improved to 14,000.  Acute on  chronic anemia latest hemoglobin 9.6.  She was cleared to begin Lovenox for DVT prophylaxis.  Spiked a low-grade fever 101.1 10/07/2022 with venous Doppler studies negative, chest x-ray showed no acute disease, urinalysis negative nitrite WBC improved at 14,000 down from 17,200.Marland KitchenPlaced empirically on Rocephin 10/08/2022 changed to Keflex for 7 days- changed since likely has neurogenic bladder.   Therapy evaluations completed due to patient decreased functional mobility was admitted for a comprehensive rehab program.  Patient transferred to CIR on 10/08/2022 .   Patient currently requires max with mobility secondary to muscle weakness, impaired timing and sequencing, abnormal tone, unbalanced muscle activation, and decreased coordination, and decreased awareness, decreased safety awareness, and decreased memory.  Prior to hospitalization, patient was independent  with mobility and lived with Daughter in a Mobile home home.  Home access is  Ramped entrance.  Patient will benefit from skilled PT intervention to maximize safe functional mobility, minimize fall risk, and decrease caregiver burden for planned discharge home with intermittent assist.  Anticipate patient will benefit from follow up [facility] at discharge.  PT - End of Session Activity Tolerance: Decreased this session Endurance Deficit: Yes Endurance Deficit Description: Pt w/postural failure in sitting and standing w/fatigue. PT Assessment Rehab Potential (ACUTE/IP ONLY): Good PT Barriers to Discharge: Inaccessible home environment;Decreased caregiver support;Home environment access/layout;Neurogenic Bowel & Bladder;Weight;Other (comments) (cervical precs, HCC) PT Patient demonstrates impairments in the following area(s): Balance;Endurance;Pain;Safety;Sensory;Skin Integrity PT Transfers Functional Problem(s): Bed Mobility;Bed to Chair;Car;Furniture PT Locomotion Functional Problem(s): Ambulation;Wheelchair Mobility PT Plan PT Intensity: Minimum of  1-2 x/day ,45 to 90 minutes PT Frequency: 5 out of 7 days PT Duration Estimated Length of Stay: 21 days PT Treatment/Interventions: Ambulation/gait training;Balance/vestibular training;Cognitive remediation/compensation;Community reintegration;Functional electrical stimulation;DME/adaptive equipment instruction;Disease management/prevention;Discharge planning;Functional mobility training;Psychosocial support;Therapeutic Activities;Neuromuscular re-education;Skin care/wound management;Therapeutic Exercise;Wheelchair propulsion/positioning;Pain management;Splinting/orthotics;UE/LE Strength taining/ROM;Patient/family education;UE/LE Coordination activities PT Transfers Anticipated Outcome(s): mod I PT Locomotion Anticipated Outcome(s): Household ambulation mod I PT Recommendation Recommendations for Other Services: Therapeutic Recreation consult Therapeutic Recreation Interventions: Outing/community reintergration Follow Up Recommendations: Other (comment) (TBD) Patient destination: Home Equipment Recommended: To be determined   PT Evaluation Precautions/Restrictions Precautions Precautions: Cervical;Fall Precaution Comments: [place] Required Braces or Orthoses: Cervical Brace Cervical Brace: Hard collar Restrictions Weight Bearing Restrictions: No General   Vital SignsTherapy Vitals BP: 132/65 Oxygen Therapy SpO2: 99 % O2 Device: Room Air Pain Pain Assessment Pain Scale: 0-10 Pain Score: 8  Pain Type: Acute pain Pain Location: Generalized Pain Frequency: Constant Pain Onset: On-going Patients Stated Pain Goal: 4 Pain Intervention(s): Medication (See eMAR) Pain Interference Pain Interference Pain Effect on Sleep: 2. Occasionally Pain Interference with Therapy Activities: 2. Occasionally Pain Interference with Day-to-Day Activities: 2. Occasionally Home Living/Prior Functioning Home Living Available Help at Discharge: Family;Available 24 hours/day Type of Home: Mobile  home Home Access: Ramped entrance Home Layout: One level Bathroom Shower/Tub: Chiropodist: Standard Bathroom Accessibility: Yes  Lives With: Daughter Vision/Perception    Denies changes from baseline.  Denies diplopia, blurred vision, visual changes. Cognition Overall Cognitive Status: Within Functional Limits for tasks assessed Orientation Level: Oriented X4 Year: 2023 Month: November Day of Week: Correct Sensation Sensation Light Touch: Appears Intact Proprioception: Appears Intact Motor      Trunk/Postural Assessment  Cervical Assessment Cervical Assessment: Exceptions to [facility]) Thoracic Assessment Thoracic Assessment: Exceptions to [facility] (r trunk  lean, flexed posture) Lumbar Assessment Lumbar Assessment: Exceptions to [facility] (sacral sit, post pelvic rotation, poor core stability) Postural Control Postural Control: Deficits on evaluation  Balance Balance Balance Assessed: Yes Static Sitting Balance Static Sitting - Balance Support: Right upper extremity supported;Left upper extremity supported;Feet supported Static Sitting - Level of Assistance: 4: Min assist Dynamic Sitting Balance Dynamic Sitting - Balance Support: Feet supported Dynamic Sitting - Level of Assistance: 3: Mod assist Dynamic Sitting Balance - Compensations: loses balance anteriorly w/forward lean, mod assist to prevent ant loss of balance Dynamic Sitting - Balance Activities: Lateral lean/weight shifting;Forward lean/weight shifting Static Standing Balance Static Standing - Balance Support: Bilateral upper extremity supported Static Standing - Level of Assistance: 1: +2 Total assist Static Standing - Comment/# of Minutes: unable to maintain, heavily flexed posture, poor LE stability, collapses forward at hips/trunk Extremity Assessment   See OT eval for full UE assessment   RLE Assessment RLE Assessment: Exceptions to [facility] General Strength Comments: R hip abd/add at least 2+/5,  hip flexion 3/5, quads 4-/5, DF 5/5, PF at least 3+/5, hams grossly 4-/5 tested in sitting RLE Strength RLE Overall Strength: Deficits LLE Assessment LLE Assessment: Exceptions to [facility] General Strength Comments: hip flexion 2/5, abd/add 2/5, quads 3-/5, hams grossly 2/5, DF 3-/5, PF at least 2+/5  Care Tool Care Tool Bed Mobility Roll left and right activity   Roll left and right assist level: Moderate Assistance - Patient 50 - 74%    Sit to lying activity   Sit to lying assist level: Moderate Assistance - Patient 50 - 74%    Lying to sitting on side of bed activity   Lying to sitting on side of bed assist level: the ability to move from lying on the back to sitting on the side of the bed with no back support.: Maximal Assistance - Patient 25 - 49%     Care Tool Transfers Sit to stand transfer   Sit to stand assist level: 2 Helpers    Chair/bed transfer   Chair/bed transfer assist level: 2 Pension scheme manager transfer   Assist Level: 2 Psychologist, prison and probation services transfer assist level: 2 helpers      Care Tool Locomotion Ambulation Ambulation activity did not occur: Safety/medical concerns        Walk 10 feet activity Walk 10 feet activity did not occur: Safety/medical concerns       Walk 50 feet with 2 turns activity Walk 50 feet with 2 turns activity did not occur: Safety/medical concerns      Walk 150 feet activity Walk 150 feet activity did not occur: Safety/medical concerns      Walk 10 feet on uneven surfaces activity Walk 10 feet on uneven surfaces activity did not occur: Safety/medical concerns      Stairs Stair activity did not occur: Safety/medical concerns        Walk up/down 1 step activity Walk up/down 1 step or curb (drop down) activity did not occur: Safety/medical concerns      Walk up/down 4 steps activity Walk up/down 4 steps activity did not occur: Safety/medical concerns      Walk up/down 12 steps activity Walk up/down 12 steps activity  did not occur: Safety/medical concerns      Pick up small objects from floor Pick up small object from the floor (from standing position) activity did not occur: Safety/medical concerns      Wheelchair Is the patient using a  wheelchair?: Yes Type of Wheelchair: Manual   Wheelchair assist level: Dependent - Patient 0%    Wheel 50 feet with 2 turns activity   Assist Level: Dependent - Patient 0%  Wheel 150 feet activity   Assist Level: Dependent - Patient 0%    Refer to Care Plan for [place] 1    Recommendations for other services: Therapeutic Recreation  Outing/community reintegration  Skilled Therapeutic Interventio  Evaluation completed (see details above and below) with education on PT POC and goals and individual treatment initiated with focus on functional mobility/transfers, LE strength, dynamic sitting and tsanding balance/coordination, standing activities in [place] including Sit to stand from perched in stedy, requiring mod assist of 2 to stand/sit from [place] from elevated bed/to TIS wc,  wc seating and positioning including TIS wc w/jay2 cushion adjusted for optimal seating/pressure distribution, pt education regarding pressure relief, seating system, use of tilt feature of wc for pressure relief.  Mobility Bed Mobility Bed Mobility: Rolling Right;Rolling Left;Left Sidelying to Sit;Sit to Supine Rolling Right: Moderate Assistance - Patient 50-74% Rolling Left: Moderate Assistance - Patient 50-74% Left Sidelying to Sit: Maximal Assistance - Patient 25-49% Sit to Supine: Maximal Assistance - Patient 25-49% Transfers Transfers: Sit to Stand Sit to Stand: 2 Helpers Transfer (Assistive device):  (elevated bed) Locomotion  Gait Ambulation: No Gait Gait: No Stairs / Additional Locomotion Stairs: No   Discharge Criteria: Patient will be discharged from PT if patient refuses treatment 3 consecutive times without medical reason, if treatment  goals not met, if there is a change in medical status, if patient makes no progress towards goals or if patient is discharged from hospital.  The above assessment, treatment plan, treatment alternatives and goals were discussed and mutually agreed upon: by patient  Jerrilyn Cairo 10/09/2022, 11:27 AM

## 2022-10-09 NOTE — Evaluation (Signed)
Occupational Therapy Assessment and Plan  Patient Details  Name: Janice Short MRN: [id number] Date of Birth: 09-Jun-1961  OT Diagnosis: abnormal posture, acute pain, muscle weakness (generalized), and quadriparesis at level C4-5 Rehab Potential: Rehab Potential (ACUTE ONLY): Good ELOS: 3 weeks   Today's Date: 10/09/2022 OT Individual Time: 6568-1275 OT Individual Time Calculation (min): 72 min     Hospital Problem: Principal Problem:   Acute incomplete quadriplegia ([place]) Active Problems:   Intrahepatic cholangiocarcinoma ([place])   Central cord syndrome ([place])   Neurogenic bowel   Pressure ulcer, stage 3 ([place])   Past Medical History:  Past Medical History:  Diagnosis Date   Allergy    Asthma    Carpal tunnel syndrome on left    Cervical radiculitis    Chronic osteoarthritis    Corns and callosity    Depression    Dermatophytosis of foot    Diabetes mellitus without complication ([place])    Gout    Hyperlipidemia    Hypertension    Impingement syndrome of left shoulder    Intrahepatic cholangiocarcinoma ([facility])    Lumbosacral neuritis    Microalbuminuria    Obesity    Supraventricular tachycardia 07/01/2015   SVT (supraventricular tachycardia)    Tenosynovitis of wrist    Vitamin D deficiency    Past Surgical History:  Past Surgical History:  Procedure Laterality Date   ABDOMINAL HYSTERECTOMY     ANTERIOR CERVICAL DECOMP/DISCECTOMY FUSION N/A 10/02/2022   Procedure: ANTERIOR CERVICAL DECOMPRESSION/DISCECTOMY FUSION 1 LEVEL;  Surgeon: Meade Maw, MD;  Location: [facility] ORS;  Service: Neurosurgery;  Laterality: N/A;  ACDF C4-5.   COLONOSCOPY WITH PROPOFOL N/A 05/06/2021   Procedure: COLONOSCOPY WITH PROPOFOL;  Surgeon: Lucilla Lame, MD;  Location: [facility] ENDOSCOPY;  Service: Endoscopy;  Laterality: N/A;   ESOPHAGOGASTRODUODENOSCOPY (EGD) WITH PROPOFOL N/A 05/06/2021   Procedure: ESOPHAGOGASTRODUODENOSCOPY (EGD) WITH PROPOFOL;  Surgeon: Lucilla Lame, MD;  Location: [facility]  ENDOSCOPY;  Service: Endoscopy;  Laterality: N/A;   PORTA CATH INSERTION N/A 07/17/2021   Procedure: PORTA CATH INSERTION;  Surgeon: Algernon Huxley, MD;  Location: [place] CV LAB;  Service: Cardiovascular;  Laterality: N/A;    Assessment & Plan Clinical Impression: Patient is a 61 y.o. year old female with recent admission to the hospital on ith history of diabetes mellitus, CKD stage III, chronic anemia, chronic bronchitis, intrahepatic cholangiocarcinoma on chemotherapy last infused 09/18/2022 followed by [name], hypertension, hyperlipidemia, gout, obesity with BMI 27.62, tobacco use, carpal tunnel syndrome on left.  Per chart review patient lives with daughter and 4-year-old grandson.  Mobile home with ramped entrance.  Reportedly independent prior to admission.  Presented to [facility] 10/01/2022 following mechanical fall when she tripped, fell backwards striking her face on a railing.  She was unable to help herself up.  She sustained a laceration to the left cheek and complained of left hip pain and bilateral upper extremity numbness.  Admission chemistries unremarkable except potassium 5.7, creatinine 1.30, GFR 46, calcium 7.9, glucose 243, hemoglobin 9.7, WBC 12,200.  Patient's laceration to left cheek was sutured in the ED.  Cranial CT scan negative for intracranial abnormality.  CT maxillofacial negative.  CT left hip negative for fracture.  CT cervical spine findings consistent with nondisplaced teardrop type fracture involving the anterior inferior aspect of the C2 vertebral body as well as severe stenosis C4-5 with myelomalacia.  Neurosurgery consulted underwent anterior cervical discectomy decompression and fusion/cervical instrumentation C4-5 10/02/2022 per Dr. Izora Ribas.  Hard cervical collar when out of bed and can  be applied in the sitting position.  Hospital course AKI on CKD stage III baseline serum creatinine 1.18 > 1.26.  Noted hyperkalemia 5.7 responded well to [facility] with latest  potassium 4.2 and creatinine 1.50.  Leukocytosis 21,600 improved to 14,000.  Acute on chronic anemia latest hemoglobin 9.6.  She was cleared to begin Lovenox for DVT prophylaxis.  Spiked a low-grade fever 101.1 10/07/2022 with venous Doppler studies negative, chest x-ray showed no acute disease, urinalysis negative nitrite WBC improved at 14,000 down from 17,200.Marland KitchenPlaced empirically on Rocephin 10/08/2022 changed to Keflex for 7 days- changed since likely has neurogenic bladder.    patient transferred to CIR on 10/08/2022 .    Patient currently requires max/total A +2 with basic self-care skills secondary to muscle weakness and decreased sitting balance, decreased standing balance, decreased postural control, and decreased balance strategies.  Prior to hospitalization, patient could complete BADL with independent .  Patient will benefit from skilled intervention to increase independence with basic self-care skills prior to discharge home with care partner.  Anticipate patient will require 24 hour supervision and minimal physical assistance and follow up home health.  OT - End of Session Endurance Deficit: Yes Endurance Deficit Description: Rest breaks within BADL tasks OT Assessment Rehab Potential (ACUTE ONLY): Good OT Patient demonstrates impairments in the following area(s): Balance;Endurance;Motor;Nutrition;Pain;Safety;Sensory;Skin Integrity OT Basic ADL's Functional Problem(s): Eating;Grooming;Bathing;Dressing;Toileting OT Transfers Functional Problem(s): Toilet;Tub/Shower OT Additional Impairment(s): Fuctional Use of Upper Extremity OT Plan OT Intensity: Minimum of 1-2 x/day, 45 to 90 minutes OT Frequency: 5 out of 7 days OT Duration/Estimated Length of Stay: 3 weeks OT Treatment/Interventions: Balance/vestibular training;Cognitive remediation/compensation;Community reintegration;Discharge planning;Disease mangement/prevention;DME/adaptive equipment instruction;Functional electrical  stimulation;Functional mobility training;Neuromuscular re-education;Pain management;Patient/family education;Psychosocial support;Self Care/advanced ADL retraining;Skin care/wound managment;Splinting/orthotics;Therapeutic Activities;Therapeutic Exercise;UE/LE Strength taining/ROM;UE/LE Coordination activities;Visual/perceptual remediation/compensation;Wheelchair propulsion/positioning OT Self Feeding Anticipated Outcome(s): set-up A OT Basic Self-Care Anticipated Outcome(s): Min A/supervision OT Toileting Anticipated Outcome(s): Min A OT Bathroom Transfers Anticipated Outcome(s): Min A OT Recommendation Patient destination: Home Follow Up Recommendations: Home health OT Equipment Recommended: To be determined Equipment Details: TBD   OT Evaluation Precautions/Restrictions  Precautions Precautions: Cervical;Fall Precaution Booklet Issued: No Required Braces or Orthoses: Cervical Brace Cervical Brace: Hard collar Restrictions Weight Bearing Restrictions: No Pain Pain Assessment Pain Scale: 0-10 Pain Score: 8  Pain Type: Acute pain Pain Location: Generalized Pain Descriptors / Indicators: Aching Pain Frequency: Constant Pain Onset: On-going Patients Stated Pain Goal: 4 Pain Intervention(s): Medication (See eMAR) Home Living/Prior Functioning Home Living Living Arrangements: Children, Other relatives Available Help at Discharge: Family, Available 24 hours/day Type of Home: Mobile home Home Access: Ramped entrance Home Layout: One level Bathroom Shower/Tub: Chiropodist: Standard Bathroom Accessibility: Yes  Lives With: Daughter Prior Function Level of Independence: Independent with homemaking with ambulation, Independent with basic ADLs Driving: Yes Vision Baseline Vision/History: 0 No visual deficits Ability to See in Adequate Light: 0 Adequate Patient Visual Report: No change from baseline Cognition Cognition Overall Cognitive Status: Within  Functional Limits for tasks assessed Arousal/Alertness: Awake/alert Orientation Level: Place;Person;Situation Person: Oriented Place: Oriented Situation: Oriented Memory: Appears intact Safety/Judgment: Appears intact Brief Interview for Mental Status (BIMS) Repetition of Three Words (First Attempt): 3 Temporal Orientation: Year: Correct Temporal Orientation: Month: Accurate within 5 days Temporal Orientation: Day: Correct Recall: "Sock": Yes, no cue required Recall: "Blue": Yes, no cue required Recall: "Bed": Yes, after cueing ("a piece of furniture") BIMS Summary Score: 14 Sensation Sensation Light Touch: Appears Intact Proprioception: Appears Intact Coordination Gross Motor Movements are Fluid and Coordinated: No  Fine Motor Movements are Fluid and Coordinated: No Coordination and Movement Description: Decreased smoothness and accuracy Motor  Motor Motor: Tetraplegia  Trunk/Postural Assessment  Cervical Assessment Cervical Assessment: Exceptions to [facility]) Thoracic Assessment Thoracic Assessment: Exceptions to [facility] (r trunk lean, flexed posture) Lumbar Assessment Lumbar Assessment: Exceptions to [facility] (sacral sit, post pelvic rotation, poor core stability) Postural Control Postural Control: Deficits on evaluation  Balance Balance Balance Assessed: Yes Static Sitting Balance Static Sitting - Balance Support: Right upper extremity supported;Left upper extremity supported;Feet supported Static Sitting - Level of Assistance: 4: Min assist Dynamic Sitting Balance Dynamic Sitting - Balance Support: Feet supported Dynamic Sitting - Level of Assistance: 3: Mod assist Static Standing Balance Static Standing - Balance Support: Bilateral upper extremity supported Static Standing - Level of Assistance: 1: +2 Total assist Extremity/Trunk Assessment RUE Assessment RUE Assessment: Exceptions to [facility] General Strength Comments: RUE- Bicep 4+/5; triceps 4/5; WE 4-/5; grip 4-/5; LUE  Assessment LUE Assessment: Exceptions to [facility] General Strength Comments: LUE- Biceps 4-/5; WE 3-/5; Triceps 2/5; Grip 2-/5  Care Tool Care Tool Self Care Eating   Eating Assist Level: Contact Guard/Touching assist    Oral Care    Oral Care Assist Level: Minimal Assistance - Patient > 75%    Bathing   Body parts bathed by patient: Chest;Front perineal area;Abdomen;Face;Left arm Body parts bathed by helper: Right arm;Left lower leg;Right lower leg;Right upper leg;Left upper leg;Buttocks   Assist Level: Maximal Assistance - Patient 24 - 49%    Upper Body Dressing(including orthotics)   What is the patient wearing?: Pull over shirt   Assist Level: Maximal Assistance - Patient 25 - 49%    Lower Body Dressing (excluding footwear)     Assist for lower body dressing: Total Assistance - Patient < 25%    Putting on/Taking off footwear     Assist for footwear: Dependent - Patient 0%       Care Tool Toileting Toileting activity   Assist for toileting: 2 Helpers     Care Tool Bed Mobility Roll left and right activity   Roll left and right assist level: Moderate Assistance - Patient 50 - 74%    Sit to lying activity   Sit to lying assist level: Moderate Assistance - Patient 50 - 74%    Lying to sitting on side of bed activity   Lying to sitting on side of bed assist level: the ability to move from lying on the back to sitting on the side of the bed with no back support.: Maximal Assistance - Patient 25 - 49%     Care Tool Transfers Sit to stand transfer   Sit to stand assist level: 2 Helpers    Chair/bed transfer   Chair/bed transfer assist level: 2 Pension scheme manager transfer   Assist Level: 2 Helpers     Care Tool Cognition  Expression of Ideas and Wants Expression of Ideas and Wants: 4. Without difficulty (complex and basic) - expresses complex messages without difficulty and with speech that is clear and easy to understand  Understanding Verbal and Non-Verbal  Content Understanding Verbal and Non-Verbal Content: 4. Understands (complex and basic) - clear comprehension without cues or repetitions   Memory/Recall Ability Memory/Recall Ability : Current season;Location of own room;Staff names and faces   Refer to Care Plan for Long Term Goals  SHORT TERM GOAL WEEK 1 OT Short Term Goal 1 (Week 1): Patient will maintain sitting balance at EOB with no more than min  A with dynamic task OT Short Term Goal 2 (Week 1): Patient will be able to reach under R arm to wash armpit OT Short Term Goal 3 (Week 1): Patient will complete toilet transfer with mod A +2  Recommendations for other services: Neuropsych   Skilled Therapeutic Intervention OT eval completed addressing rehab process, OT purpose, POC, ELOS, and goals. Bed level LB bathing/dressing completed as described below. Educated on cervical precautions and c-collar donned at EOB. Pt completed log roll to sitting EOB with mod A. UB bathing and dressing at EOB. Attempted sit<>stand at EOB with max A of 1 with Stedy but unable to achieve hip and trunk extension.  Lateral scooting along EOB to prepare for slideboard transfer, but did not have time to transfer today. Pt returned to bed with max A.  See below for further details regarding BADL performance   ADL ADL Eating: Contact guard;Set up Grooming: Minimal assistance Upper Body Bathing: Moderate assistance Lower Body Bathing: Maximal assistance;Dependent Upper Body Dressing: Maximal assistance Lower Body Dressing: Dependent Toileting: Dependent Toilet Transfer: Dependent (+2 STedy) Mobility  Bed Mobility Bed Mobility: Rolling Right;Rolling Left;Left Sidelying to Sit;Sit to Supine Rolling Right: Moderate Assistance - Patient 50-74% Rolling Left: Moderate Assistance - Patient 50-74% Left Sidelying to Sit: Maximal Assistance - Patient 25-49% Sit to Supine: Maximal Assistance - Patient 25-49% Transfers Sit to Stand: 2 Helpers   Discharge  Criteria: Patient will be discharged from OT if patient refuses treatment 3 consecutive times without medical reason, if treatment goals not met, if there is a change in medical status, if patient makes no progress towards goals or if patient is discharged from hospital.  The above assessment, treatment plan, treatment alternatives and goals were discussed and mutually agreed upon: by patient  Valma Cava 10/09/2022, 4:09 PM

## 2022-10-09 NOTE — Telephone Encounter (Signed)
Call from [place], [place] PA reporting that patient is inpatient there for a spinal cord injury and that they cannot do chemotherapy there. Asking of this is to be postponed for outpatient treatment. Dr Dagoberto Ligas is her rehab physician and can be reached at [phone]  Her next sched appointment is 10/16/22

## 2022-10-09 NOTE — Progress Notes (Signed)
Met with patient. She is very happy to be here. Oriented to rehab, informed to team conference every Tuesday. Reviewed binder with patient. A1C 5.9 and she reports that PCP is happy with that. Discussed some modifications to improve protein intake and reduce fats/fried foods as trig 181. Self reports that daughter does most of the cooking but does get fast foods and limits fries and get "skinny bread" with chicken. Discussed increasing calcium and food to eat to improve bone health with Vit D. Discussed patient B/B. Verified that has been incontinent of urine since foley removed. Reports that can feel she has to go. Patient to be bladder scanned at 11:00 per NT. Notified patient. Also encouraged doing Kegel exercises to promote muscle strengthening. I informed that may have to be I/O cath'd but to continue to work on the exercises because may help. Also patient aware of wearing [place] when OOB. Patient is concerned about where she is with her chemo tx. Michela Pitcher that goes every other Friday and was supposed to go the Friday she fell. Also reports that was taking Gabapentin 600 mg BID at home and both of her hands are numb and that getting 366m only.  Will notify PA.

## 2022-10-10 DIAGNOSIS — E1169 Type 2 diabetes mellitus with other specified complication: Secondary | ICD-10-CM

## 2022-10-10 DIAGNOSIS — N1831 Chronic kidney disease, stage 3a: Secondary | ICD-10-CM

## 2022-10-10 DIAGNOSIS — E669 Obesity, unspecified: Secondary | ICD-10-CM

## 2022-10-10 DIAGNOSIS — N179 Acute kidney failure, unspecified: Secondary | ICD-10-CM

## 2022-10-10 LAB — URINE CULTURE: Culture: 50000 — AB

## 2022-10-10 LAB — GLUCOSE, CAPILLARY
Glucose-Capillary: 123 mg/dL — ABNORMAL HIGH (ref 70–99)
Glucose-Capillary: 208 mg/dL — ABNORMAL HIGH (ref 70–99)
Glucose-Capillary: 182 mg/dL — ABNORMAL HIGH (ref 70–99)
Glucose-Capillary: 137 mg/dL — ABNORMAL HIGH (ref 70–99)

## 2022-10-10 NOTE — Progress Notes (Signed)
Occupational Therapy Session Note  Patient Details  Name: LEORA PLATT MRN: [id number] Date of Birth: 1961/07/14  Today's Date: 10/10/2022 OT Individual Time: 6195-0932 OT Individual Time Calculation (min): 45 min    Short Term Goals: Week 1:  OT Short Term Goal 1 (Week 1): Patient will maintain sitting balance at EOB with no more than min A with dynamic task OT Short Term Goal 2 (Week 1): Patient will be able to reach under R arm to wash armpit OT Short Term Goal 3 (Week 1): Patient will complete toilet transfer with mod A +2  Skilled Therapeutic Interventions/Progress Updates:  Patient in bed upon arrival, pt indicated that she rested fairly with no pain to report. The pt was agreeable to completing BADL task in dressing after stating that she had bathe early. The pt's cervical collar was place on prior to going from supine to EOB. The pt was able to donn her over head shirt using the hemi technique with MinA, she was ModA with pants.The pt was ModA x2 using the [facility] for coming from EOB to w/c LOF.    The pt was able to go to sink for brushing her teeth and combing her hair with s/uA .  At the end of the treatment session,  the chair alarm was in place, the bedside table and call light were within reach and  all additional needs were addressed prior to exiting the room.  Therapy Documentation Precautions:  Precautions Precautions: Cervical, Fall Precaution Booklet Issued: No Precaution Comments: [place] Required Braces or Orthoses: Cervical Brace Cervical Brace: Hard collar Restrictions Weight Bearing Restrictions: No  Therapy/Group: Individual Therapy  Yvonne Kendall 10/10/2022, 3:45 PM

## 2022-10-10 NOTE — Progress Notes (Signed)
PROGRESS NOTE   Subjective/Complaints:  In good spirits. Wants a blueberry muffin!. Current diet won't allow  ROS: Patient denies fever, rash, sore throat, blurred vision, dizziness, nausea, vomiting, diarrhea, cough, shortness of breath or chest pain, joint or back/neck pain, headache, or mood change.   Objective:   No results found. Recent Labs    10/08/22 0559  WBC 14.0*  HGB 8.5*  HCT 27.4*  PLT 313   Recent Labs    10/08/22 0559 10/09/22 0943  NA 136 135  K 4.2 4.6  CL 109 107  CO2 23 21*  GLUCOSE 135* 240*  BUN 22 17  CREATININE 1.50* 1.41*  CALCIUM 7.9* 7.8*    Intake/Output Summary (Last 24 hours) at 10/10/2022 1010 Last data filed at 10/10/2022 0700 Gross per 24 hour  Intake 420 ml  Output 660 ml  Net -240 ml     Pressure Injury 10/06/22 Buttocks Right Stage 3 -  Full thickness tissue loss. Subcutaneous fat may be visible but bone, tendon or muscle are NOT exposed. see picture in Epic (Active)  10/06/22 1130  Location: Buttocks  Location Orientation: Right  Staging: Stage 3 -  Full thickness tissue loss. Subcutaneous fat may be visible but bone, tendon or muscle are NOT exposed.  Wound Description (Comments): see picture in Epic  Present on Admission: Yes    Physical Exam: Vital Signs Blood pressure 125/70, pulse 99, temperature 98.9 F (37.2 C), resp. rate 15, height 5\' 7"  (1.702 m), weight 82.4 kg, SpO2 100 %.    Constitutional: No distress . Vital signs reviewed. HEENT: NCAT, EOMI, oral membranes moist Neck: supple Cardiovascular: RRR without murmur. No JVD    Respiratory/Chest: CTA Bilaterally without wheezes or rales. Normal effort    GI/Abdomen: BS +, non-tender, non-distended Ext: no clubbing, cyanosis, or edema Psych: pleasant and cooperative  Genitourinary:    Comments: Vulva has no rashes/wounds Musculoskeletal:     Comments: RUE- Bicep 4+/5; triceps 4/5; WE 4-/5; grip 4-/5;  FA 4-/5 LUE- Biceps 4-/5; WE 3-/5; Triceps 2/5; Grip 2-/5 and FA 1/5 RLE- HF, KE 4/5; DF/PF and EHL 4+/5 LLE- DF/PF 4/5 and EHL 4-/5- pt unable to let me uncover her again, she's so cold to test rest of LLE  Skin:    General: Skin is warm and dry.     Comments: Skin laceration to left facial cheek with stitches in place.  ACDF site clean and dry Stage III- superficial stage III 1.3 x 1.0 x 0.5 cm on coccyx with a few tiny spots that are more superficial around it   Neurological:     Mental Status: She is alert.     Comments: Patient is alert and oriented x3.  Follows full commands. Ox3 No abnl tone or clonus RLE/LLE Assessment/Plan: 1. Functional deficits which require 3+ hours per day of interdisciplinary therapy in a comprehensive inpatient rehab setting. Physiatrist is providing close team supervision and 24 hour management of active medical problems listed below. Physiatrist and rehab team continue to assess barriers to discharge/monitor patient progress toward functional and medical goals  Care Tool:  Bathing    Body parts bathed by patient: Chest, Front perineal area, Abdomen,  Face, Left arm   Body parts bathed by helper: Right arm, Left lower leg, Right lower leg, Right upper leg, Left upper leg, Buttocks     Bathing assist Assist Level: Maximal Assistance - Patient 24 - 49%     Upper Body Dressing/Undressing Upper body dressing   What is the patient wearing?: Pull over shirt    Upper body assist Assist Level: Maximal Assistance - Patient 25 - 49%    Lower Body Dressing/Undressing Lower body dressing            Lower body assist Assist for lower body dressing: Total Assistance - Patient < 25%     Toileting Toileting    Toileting assist Assist for toileting: 2 Helpers     Transfers Chair/bed transfer  Transfers assist     Chair/bed transfer assist level: 2 Helpers     Locomotion Ambulation   Ambulation assist   Ambulation activity did not  occur: Safety/medical concerns          Walk 10 feet activity   Assist  Walk 10 feet activity did not occur: Safety/medical concerns        Walk 50 feet activity   Assist Walk 50 feet with 2 turns activity did not occur: Safety/medical concerns         Walk 150 feet activity   Assist Walk 150 feet activity did not occur: Safety/medical concerns         Walk 10 feet on uneven surface  activity   Assist Walk 10 feet on uneven surfaces activity did not occur: Safety/medical concerns         Wheelchair     Assist Is the patient using a wheelchair?: Yes Type of Wheelchair: Manual    Wheelchair assist level: Dependent - Patient 0%      Wheelchair 50 feet with 2 turns activity    Assist        Assist Level: Dependent - Patient 0%   Wheelchair 150 feet activity     Assist      Assist Level: Dependent - Patient 0%   Blood pressure 125/70, pulse 99, temperature 98.9 F (37.2 C), resp. rate 15, height 5\' 7"  (1.702 m), weight 82.4 kg, SpO2 100 %.  Medical Problem List and Plan: 1. 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  Status post ACDF C4-5 10/02/2022 per Dr.Yarbrough.  Cervical collar when out of bed applied in sitting position             -patient may  shower- monitor skin             -ELOS/Goals: 3-4 weeks- min A hopefully  -Continue CIR therapies including PT, OT  2.  Antithrombotics: -DVT/anticoagulation:  Pharmaceutical: Lovenox.  Venous Doppler studies 10/07/2022 negative             -antiplatelet therapy: N/A 3. Pain Management: Neurontin 300 mg twice daily, Robaxin and oxycodone as needed  11/3- pain 8-9/10 this Am, but was due for pain meds- con't regimen for now- will increase to 600 mg BID but no higher due to some elevation/Cr of 1.4-1.5  11/4 pain  appears controlled today 4. Mood/Behavior/Sleep: Lexapro 25 mg daily             -antipsychotic agents: N/A 5. Neuropsych/cognition: This patient is capable of making decisions on her own behalf. 6. Skin/Wound Care left  facial cheek laceration: Status post sutures received in ED.  Routine skin checks 7. Fluids/Electrolytes/Nutrition:   -will adv to regular diet. She can make choices as to what she can chew or not 8.  Diabetes mellitus.  Hemoglobin A1c 5.9.  Currently maintained on Semglee 8 units daily.  Patient on Tresiba 8 units daily prior to admission 9.  AKI on CKD stage III.  Baseline creatinine 1.18-1.26.  Follow-up chemistries  11/3-4- Cr up to 1.41- will recheck Monday and push fluids 10.  Acute on chronic anemia.  Follow-up CBC 11.  Intrahepatic cholangiocarcinoma.  Patient on chemotherapy followed by Dr. Randa Evens.  Follow-up outpatient- port not accessed- missed last chemo treatment-her car broke down- will need to be here 3-4 weeks.   11/3- supposedly got chemo q2 weeks- will need to check with Oncology what to do while here. D/w PA 12.  Obesity.  BMI 27.62.  Dietary follow-up 13.  Hypertension.  Norvasc 2.5 mg daily.  Monitor with increased mobility 14.  History of tobacco use/COPD.  Continue inhalers as indicated 15.  Hyperlipidemia.  Lipitor 16. Complicated UTI.Empiric IV Rocephin 10/08/2022 changed to keflex for 7 days- pending culture, hopefully- will monitor 17. Neurogenic bladder- will do bladder scans q6 hours and cath if volumes >350cc 18. Neurogenic bowel and constipation- will get cleaned out- Sorbitol 45cc now and follow with SSE if no results- might need bowel program- not clear yet.   11/3- cleaned out- was continent 19. Stage III coccyx pressure ulcer- on admission- will automatically get WOC consult and determine if needs air mattress, place foam for the moment and turn often- will order turning q2 hours  11/3- will make sure she's on ROHO- seen by WOC- started  aquacel with foam dressing to change daily.  20. Shivering/cold- will order kpad to warm her up and to help back pain.   11/3- resolved        LOS: 2 days A FACE TO [place] 10/10/2022, 10:10 AM

## 2022-10-11 LAB — GLUCOSE, CAPILLARY
Glucose-Capillary: 192 mg/dL — ABNORMAL HIGH (ref 70–99)
Glucose-Capillary: 224 mg/dL — ABNORMAL HIGH (ref 70–99)
Glucose-Capillary: 193 mg/dL — ABNORMAL HIGH (ref 70–99)
Glucose-Capillary: 222 mg/dL — ABNORMAL HIGH (ref 70–99)

## 2022-10-11 MED ORDER — SORBITOL 70 % SOLN
60.0000 mL | Status: AC
  Administered 2022-10-11: 60 mL via ORAL
  Filled 2022-10-11: qty 60

## 2022-10-11 MED ORDER — SENNOSIDES-DOCUSATE SODIUM 8.6-50 MG PO TABS
2.0000 | ORAL_TABLET | Freq: Every day | ORAL | Status: DC
  Administered 2022-10-11 – 2022-10-12 (×2): 2 via ORAL
  Filled 2022-10-11 (×2): qty 2

## 2022-10-11 MED ORDER — BISACODYL 10 MG RE SUPP: 10.0000 mg | Freq: Every day | RECTAL | Status: DC | PRN

## 2022-10-11 NOTE — Progress Notes (Signed)
Occupational Therapy Session Note  Patient Details  Name: Janice Short MRN: [id number] Date of Birth: 01-10-1961  Today's Date: 10/11/2022 OT Individual Time: 2952-8413 OT Individual Time Calculation (min): 75 min    Short Term Goals: Week 1:  OT Short Term Goal 1 (Week 1): Patient will maintain sitting balance at EOB with no more than min A with dynamic task OT Short Term Goal 2 (Week 1): Patient will be able to reach under R arm to wash armpit OT Short Term Goal 3 (Week 1): Patient will complete toilet transfer with mod A +2  Skilled Therapeutic Interventions/Progress Updates:    Upon OT arrival, pt semi recumbent in bed reporting no pain and is agreeable to OT treatment. Treatment intervention with a focus on self care retraining, functional bed mobility, functional transfers. Non skid socks donned with Max A bed level. Pt performs supine to sit transfer with Mod A and increased time. Pt donns cervical collar with Max A and donns pants EOB with folling side to side to manage over hips with Max A. Pt scoots her hips posteriorly with Mod A to sit EOB safely in order to eat her breakfast with Setup. Pt sits EOB with SBA and washes her face and hands with Setup. Pt attempts slideboard transfer from bed to w/c but was unable to complete safely secondary to pressure relief bed being too slick and slideboard sliding out from under pt. Pt requires assist of 2 to scoot hips back onto bed. Pt performs stedy transfer from bed to chair with Max A and was transported infront of sink to complete oral care with Setup. Pt was left in TIS at end of session with all needs met and safety measures in place.   Therapy Documentation Precautions:  Precautions Precautions: Cervical, Fall Precaution Booklet Issued: No Precaution Comments: [place] Required Braces or Orthoses: Cervical Brace Cervical Brace: Hard collar Restrictions Weight Bearing Restrictions: No    Therapy/Group: Individual Therapy  Velma Hanna 10/11/2022, 8:12 AM

## 2022-10-11 NOTE — Progress Notes (Signed)
Physical Therapy Session Note  Patient Details  Name: Janice Short MRN: [id number] Date of Birth: Nov 14, 1961  Today's Date: 10/11/2022 PT Individual Time: 1000-1056,  PT Individual Time Calculation (min): 56 min ,57 min   Short Term Goals: Week 1:  PT Short Term Goal 1 (Week 1): pt will perform supine to sit w/mod assist of 1 PT Short Term Goal 2 (Week 1): pt will perform bed to/from chair transfer w/LRAD w/mod assist of 1 PT Short Term Goal 3 (Week 1): Pt will request assit w/pressure relief consistently per schedule w/minimal cueing/reminders PT Short Term Goal 4 (Week 1): pt will perform sit to stand in LRAD mod assist of 2  Skilled Therapeutic Interventions/Progress Updates:      Therapy Documentation Precautions:  Precautions Precautions: Cervical, Fall Precaution Booklet Issued: No Precaution Comments: [place] Required Braces or Orthoses: Cervical Brace Cervical Brace: Hard collar Restrictions Weight Bearing Restrictions: No  Treatment Session 1:  Pt received seated in w/c at bedside, agreeable to PT session with emphasis on transfer and LE strength training. Pt with unrated left shoulder pain and pre-medicated prior to start of session. Pt transported by w/c to dayroom for time management and energy conservation. PT demonstrated slide board transfer and provided max A for slide board placement. Pt performed w/c to mat transfer via slide board with CGA and verbal cues for anterior weight shift and hand placement. Pt transitioned to blocked practice of 5 sit to stand transfers with max PT assist. Pt requires tactile cueing and physical assist at hips to facilitate posterior chain activation as pt with decreased strength. Pt also educated on head/hips relationship for transfers. Pt performed squat pivot transfer with max A from mat to w/c. Pt requested to utilize kinetron at 30 cm/sec and performed x 10 minutes to facilitate LE strength improvements. Pt performed 1 x 6 of w/c push up's  to increase triceps activation. Pt transported to room and left seated in w/c at bedside with chair alarm on and all needs within reach.   Treatment Session 2:  Pt received seated in w/c at bedside and agreeable to PT session with emphasis on body weight support gait training. Pt transported by w/c to dayroom and requires +2 in sitting and standing to don lite gait harness. Pt requires max A with sit to stand and presents with decreased gluteal activation and requires cueing and manual facilitation to maintain upright posture. Pt ambulated with lite gait 190 ft x 2 with seated rest break in between each bout with CGA. PT provided tactile cueing at hips/pelvis for weight shifting. Pt presents with decreased hip extension and push off in terminal stance and will benefit from continued gait training and posterior chain strengthening. Pt reported 9/10 pain following final bout of ambulation and nursing notified and administered pain medication. Pt requests need to toilet and transported to room by w/c and max A with sit to stand with stedy utilized for time management. Pt left on toilet with nursing present.    Therapy/Group: Individual Therapy  Verl Dicker Verl Dicker PT, DPT  10/11/2022, 7:06 AM

## 2022-10-12 ENCOUNTER — Inpatient Hospital Stay (HOSPITAL_COMMUNITY): Payer: Medicare Other

## 2022-10-12 LAB — CULTURE, BLOOD (ROUTINE X 2)
Culture: NO GROWTH
Culture: NO GROWTH
Special Requests: ADEQUATE

## 2022-10-12 LAB — CBC WITH DIFFERENTIAL/PLATELET
Abs Immature Granulocytes: 0.22 10*3/uL — ABNORMAL HIGH (ref 0.00–0.07)
Basophils Absolute: 0.1 10*3/uL (ref 0.0–0.1)
Basophils Relative: 0 %
Eosinophils Absolute: 0.2 10*3/uL (ref 0.0–0.5)
Eosinophils Relative: 1 %
HCT: 27.7 % — ABNORMAL LOW (ref 36.0–46.0)
Hemoglobin: 9 g/dL — ABNORMAL LOW (ref 12.0–15.0)
Immature Granulocytes: 1 %
Lymphocytes Relative: 10 %
Lymphs Abs: 1.9 10*3/uL (ref 0.7–4.0)
MCH: 31.5 pg (ref 26.0–34.0)
MCHC: 32.5 g/dL (ref 30.0–36.0)
MCV: 96.9 fL (ref 80.0–100.0)
Monocytes Absolute: 2.4 10*3/uL — ABNORMAL HIGH (ref 0.1–1.0)
Monocytes Relative: 12 %
Neutro Abs: 14.9 10*3/uL — ABNORMAL HIGH (ref 1.7–7.7)
Neutrophils Relative %: 76 %
Platelets: 240 10*3/uL (ref 150–400)
RBC: 2.86 MIL/uL — ABNORMAL LOW (ref 3.87–5.11)
RDW: 19.8 % — ABNORMAL HIGH (ref 11.5–15.5)
WBC: 19.6 10*3/uL — ABNORMAL HIGH (ref 4.0–10.5)
nRBC: 0 % (ref 0.0–0.2)

## 2022-10-12 LAB — BASIC METABOLIC PANEL
Anion gap: 13 (ref 5–15)
BUN: 13 mg/dL (ref 8–23)
CO2: 20 mmol/L — ABNORMAL LOW (ref 22–32)
Calcium: 7.9 mg/dL — ABNORMAL LOW (ref 8.9–10.3)
Chloride: 104 mmol/L (ref 98–111)
Creatinine, Ser: 1.47 mg/dL — ABNORMAL HIGH (ref 0.44–1.00)
GFR, Estimated: 40 mL/min — ABNORMAL LOW (ref >60)
Glucose, Bld: 94 mg/dL (ref 70–99)
Potassium: 4.3 mmol/L (ref 3.5–5.1)
Sodium: 137 mmol/L (ref 135–145)

## 2022-10-12 LAB — GLUCOSE, CAPILLARY
Glucose-Capillary: 96 mg/dL (ref 70–99)
Glucose-Capillary: 165 mg/dL — ABNORMAL HIGH (ref 70–99)
Glucose-Capillary: 160 mg/dL — ABNORMAL HIGH (ref 70–99)
Glucose-Capillary: 192 mg/dL — ABNORMAL HIGH (ref 70–99)

## 2022-10-12 NOTE — Progress Notes (Addendum)
Writer entered room to respond a pt's call. Pt stated having discomfort in rectum. Stated that she just received enema not long ago, however has not worked yet. Upon assessment, rectum noted to be gaping with visible hard stool at exit. Pt in agreement for disimpaction. Moderate stool removed. Noted red streaks with stool and pt complained of severe pain and screaming and crying through out, however willing to remove all stool. Removed all visible stool and hygiene provided. Pt stated feeling better afterwards and positioned to comfort. Pain med given by primary RN. Left pt in bed with all needs within reach.    Gerald Stabs, RN

## 2022-10-12 NOTE — Progress Notes (Signed)
Occupational Therapy Session Note  Patient Details  Name: Janice Short MRN: [id number] Date of Birth: 1961-01-14  Today's Date: 10/12/2022 OT Individual Time: 1st Session 0915-1030, 2nd Session 1300-1415  OT Individual Time Calculation (min): 75 min, 75 min    Short Term Goals: Week 1:  OT Short Term Goal 1 (Week 1): Patient will maintain sitting balance at EOB with no more than min A with dynamic task OT Short Term Goal 2 (Week 1): Patient will be able to reach under R arm to wash armpit OT Short Term Goal 3 (Week 1): Patient will complete toilet transfer with mod A +2  Skilled Therapeutic Interventions/Progress Updates:   1st Session: Pt agreeable to am OT session and requested assistance with voiding to female urinal with max A. Voided 200 cc's. L shoulder and rectal pain, bed level bathing/dressing with max A, UB bathing and pull over shirt mod A, grooming sink side set up and occ min A, SB transfer bed to TIS with min A. OT replaced foam sacral dressing and educated on pressure reliefs throughout session. Sink side oral and hair care with mod fading to min A. OT facilitated L shoulder support during function. Pt reported throughout session rectal pain intermittently but requested OT to transport her to downstairs ATM as landlord was coming after lunch for her rent. Once at [place], pt able to operate successfully to withdraw her rent and manipulate wallet, card and buttons on screen and key board primarily with R hand. Once back in room, pt left reclined in TIS for skin protection and pain management with chair alarm set, nurse call button and needs in reach.   2nd Session: Pt still up in [place] when OT arrived for 2nd session. Pt reports she will be getting an enema due to rectal pain and if OT could transfer back to bed after session. Also nursing requested back to bed after session for radiology. Pt agreeable to Estim to L shoulder via SaeboStim One for pain management and ROM progression for 15  min with moist heat applied to R sh due to report of OA pain with overuse on LiteGait yesterday. Reported 8/10 pain at start of session. Parameters for Estim and response below. OT then facilitated Steady to commode and back to bed with mod-max A to imitate sit to stand 50% and mod A 50%. Total A for peri and buttocks hygiene. Janice Short NT arrived to assist OT to prepare pt for enema and X-ray as pt needed to also use female urinal, had small bowel movement and needs for adjusting bed level. OT left pt in care of NT.   SaeboStim One to L sh: no adverse skin reactions.  330 pulse width 35 Hz pulse rate On 8 sec/ off 8 sec Ramp up/ down 2 sec Symmetrical Biphasic wave form  Max intensity 176m at 500 Ohm load  Therapy Documentation Precautions:  Precautions Precautions: Cervical, Fall Precaution Booklet Issued: No Precaution Comments: [place] Br[place] or Orthoses: Cervical Brace Cervical Brace: Hard collar Restrictions Weight Bearing Restrictions: No General:   Vital Signs: Therapy Vitals Temp: 98.5 F (36.9 C) Temp Source: Oral Pulse Rate: 96 Resp: 17 BP: (!) 118/59 Patient Position (if appropriate): Lying Oxygen Therapy SpO2: 97 % O2 Device: Room Air    Therapy/Group: Individual Therapy  EBarnabas Lister11/05/2022, 7:51 AM

## 2022-10-12 NOTE — Progress Notes (Signed)
PROGRESS NOTE   Subjective/Complaints:  CBG's variable Pain back and forth, but overall controlled.   1 medium and 1 small BM yesterday after sorbitol- will increase bowel program.  And add SSE after therapy since still constipated.   Gets tearful intermittently and says this bothers her.  Asked nursing to get tissues.    ROS:  Pt denies SOB, abd pain, CP, N/V/C/D, and vision changes  Objective:   No results found. Recent Labs    10/12/22 0537  WBC 19.6*  HGB 9.0*  HCT 27.7*  PLT 240   Recent Labs    10/12/22 0537  NA 137  K 4.3  CL 104  CO2 20*  GLUCOSE 94  BUN 13  CREATININE 1.47*  CALCIUM 7.9*    Intake/Output Summary (Last 24 hours) at 10/12/2022 0848 Last data filed at 10/11/2022 1900 Gross per 24 hour  Intake 413 ml  Output --  Net 413 ml     Pressure Injury 10/06/22 Buttocks Right Stage 3 -  Full thickness tissue loss. Subcutaneous fat may be visible but bone, tendon or muscle are NOT exposed. see picture in Epic (Active)  10/06/22 1130  Location: Buttocks  Location Orientation: Right  Staging: Stage 3 -  Full thickness tissue loss. Subcutaneous fat may be visible but bone, tendon or muscle are NOT exposed.  Wound Description (Comments): see picture in Epic  Present on Admission: Yes    Physical Exam: Vital Signs Blood pressure (!) 118/59, pulse 96, temperature 98.5 F (36.9 C), temperature source Oral, resp. rate 17, height 5\' 7"  (1.702 m), weight 82.4 kg, SpO2 97 %.    General: awake, alert, appropriate, frustrated this AM; NAD HENT: conjugate gaze; oropharynx moist- R cheek with sutures- 1 front tooth CV: regular rate; no JVD Pulmonary: CTA B/L; no W/R/R- decreased at bases GI: soft, NT, maybe a little distended; hypoactive BS Psychiatric: appropriate but tearful at times Neurological: Ox3 Musculoskeletal:     Comments: RUE- Bicep 4+/5; triceps 4/5; WE 4-/5; grip 4-/5; FA  4-/5 LUE- Biceps 4-/5; WE 3-/5; Triceps 2/5; Grip 2-/5 and FA 1/5 RLE- HF, KE 4/5; DF/PF and EHL 4+/5 LLE- DF/PF 4/5 and EHL 4-/5- pt unable to let me uncover her again, she's so cold to test rest of LLE  Skin:    General: Skin is warm and dry.     Comments: Skin laceration to left facial cheek with stitches in place.  ACDF site clean and dry Stage III- superficial stage III 1.3 x 1.0 x 0.5 cm on coccyx with a few tiny spots that are more superficial around it   Neurological:     Mental Status: She is alert.     Comments: Patient is alert and oriented x3.  Follows full commands. Ox3 No abnl tone or clonus RLE/LLE Assessment/Plan: 1. Functional deficits which require 3+ hours per day of interdisciplinary therapy in a comprehensive inpatient rehab setting. Physiatrist is providing close team supervision and 24 hour management of active medical problems listed below. Physiatrist and rehab team continue to assess barriers to discharge/monitor patient progress toward functional and medical goals  Care Tool:  Bathing    Body parts bathed by patient:  Chest, Front perineal area, Abdomen, Face, Left arm   Body parts bathed by helper: Right arm, Left lower leg, Right lower leg, Right upper leg, Left upper leg, Buttocks     Bathing assist Assist Level: Maximal Assistance - Patient 24 - 49%     Upper Body Dressing/Undressing Upper body dressing   What is the patient wearing?: Pull over shirt    Upper body assist Assist Level: Maximal Assistance - Patient 25 - 49%    Lower Body Dressing/Undressing Lower body dressing            Lower body assist Assist for lower body dressing: Total Assistance - Patient < 25%     Toileting Toileting    Toileting assist Assist for toileting: 2 Helpers     Transfers Chair/bed transfer  Transfers assist     Chair/bed transfer assist level: 2 Helpers     Locomotion Ambulation   Ambulation assist   Ambulation activity did not occur:  Safety/medical concerns          Walk 10 feet activity   Assist  Walk 10 feet activity did not occur: Safety/medical concerns        Walk 50 feet activity   Assist Walk 50 feet with 2 turns activity did not occur: Safety/medical concerns         Walk 150 feet activity   Assist Walk 150 feet activity did not occur: Safety/medical concerns         Walk 10 feet on uneven surface  activity   Assist Walk 10 feet on uneven surfaces activity did not occur: Safety/medical concerns         Wheelchair     Assist Is the patient using a wheelchair?: Yes Type of Wheelchair: Manual    Wheelchair assist level: Dependent - Patient 0%      Wheelchair 50 feet with 2 turns activity    Assist        Assist Level: Dependent - Patient 0%   Wheelchair 150 feet activity     Assist      Assist Level: Dependent - Patient 0%   Blood pressure (!) 118/59, pulse 96, temperature 98.5 F (36.9 C), temperature source Oral, resp. rate 17, height 5\' 7"  (1.702 m), weight 82.4 kg, SpO2 97 %.  Medical Problem List and Plan: 1. 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  Status post ACDF C4-5 10/02/2022 per Dr.Yarbrough.  Cervical collar when out of bed applied in sitting position             -patient may  shower- monitor skin             -ELOS/Goals: 3-4 weeks- min A hopefully  Con't CIR- PT and OT- walked 190 ft with lite gait.  2.  Antithrombotics: -DVT/anticoagulation:  Pharmaceutical: Lovenox.  Venous Doppler studies 10/07/2022 negative             -antiplatelet therapy: N/A 3. Pain Management: Neurontin 300 mg twice daily, Robaxin and oxycodone as needed  11/3- pain 8-9/10 this Am, but was due for pain meds- con't regimen for now- will increase to 600 mg BID but no higher due to some  elevation/Cr of 1.4-1.5  11/6- pain controlled- con't regimen 4. Mood/Behavior/Sleep: Lexapro 25 mg daily             -antipsychotic agents: N/A 5. Neuropsych/cognition: This  patient is capable of making decisions on her own behalf. 6. Skin/Wound Care left facial cheek laceration: Status post sutures received in ED.  Routine skin checks 7. Fluids/Electrolytes/Nutrition:   -will adv to regular diet. She can make choices as to what she can chew or not 8.  Diabetes mellitus.  Hemoglobin A1c 5.9.  Currently maintained on Semglee 8 units daily.  Patient on Tresiba 8 units daily prior to admission  11/6- CBG's labile- 96-222-  9.  AKI on CKD stage III.  Baseline creatinine 1.18-1.26.  Follow-up chemistries  11/3-4- Cr up to 1.41- will recheck Monday and push fluids 10.  Acute on chronic anemia.  Follow-up CBC 11.  Intrahepatic cholangiocarcinoma.  Patient on chemotherapy followed by Dr. Randa Evens.  Follow-up outpatient- port not accessed- missed last chemo treatment-her car broke down- will need to be here 3-4 weeks.   11/3- supposedly got chemo q2 weeks- will need to check with Oncology what to do while here. D/w PA  11/6- they will restart once leaves rehab 12.  Obesity.  BMI 27.62.  Dietary follow-up 13.  Hypertension.  Norvasc 2.5 mg daily.  Monitor with increased mobility 14.  History of tobacco use/COPD.  Continue inhalers as indicated 15.  Hyperlipidemia.  Lipitor 16. Complicated UTI.Empiric IV Rocephin 10/08/2022 changed to keflex for 7 days- pending culture, hopefully- will monitor  11/6- ON Keflex for Proteus- is sensitive-  17. Neurogenic bladder- will do bladder scans q6 hours and cath if volumes >350cc 18. Neurogenic bowel and constipation- will get cleaned out- Sorbitol 45cc now and follow with SSE if no results- might need bowel program- not clear yet.   11/3- cleaned out- was continent 19. Stage III coccyx pressure ulcer- on admission- will automatically get WOC consult and  determine if needs air mattress, place foam for the moment and turn often- will order turning q2 hours  11/3- will make sure she's on ROHO- seen by WOC- started aquacel with foam dressing to change daily.  20. Shivering/cold- will order kpad to warm her up and to help back pain.   11/3- resolved 21. Leukocytosis- worsening  11/6- no fever- WBC 19.6- up from 14k- will order CXR; also wondering if her cancer treatment, or missing a chemo dose might play a part?- I think it's low chance, but have spoken with PA to look into further w/u.   I spent a total of 51   minutes on total care today- >50% coordination of care- due to d/w PA and pharmacy about pt's leukocytosis        LOS: 4 days A FACE TO FACE EVALUATION WAS PERFORMED  Jasmeen Fritsch 10/12/2022, 8:48 AM

## 2022-10-12 NOTE — Progress Notes (Signed)
Physical Therapy Session Note  Patient Details  Name: Janice Short MRN: [id number] Date of Birth: 01-12-1961  Today's Date: 10/12/2022 PT Individual Time: 3143-8887 PT Individual Time Calculation (min): 17 min   Short Term Goals: Week 1:  PT Short Term Goal 1 (Week 1): pt will perform supine to sit w/mod assist of 1 PT Short Term Goal 2 (Week 1): pt will perform bed to/from chair transfer w/LRAD w/mod assist of 1 PT Short Term Goal 3 (Week 1): Pt will request assit w/pressure relief consistently per schedule w/minimal cueing/reminders PT Short Term Goal 4 (Week 1): pt will perform sit to stand in LRAD mod assist of 2  Skilled Therapeutic Interventions/Progress Updates:      Therapy Documentation Precautions:  Precautions Precautions: Cervical, Fall Precaution Booklet Issued: No Precaution Comments: [place] Required Braces or Orthoses: Cervical Brace Cervical Brace: Hard collar Restrictions Weight Bearing Restrictions: No  Pt received seated in TIS w/c at bedside and on the verge of tears due to rectal spasms. PT notified nursing and PA. Pt also reports buttock pain and PT utilized tilt function in w/c for pressure relief and pt reports improvement. Pt states she is cold and PT provided pt with warm blanket. Pt missed total of 43 minutes of skilled PT due to pain.   Therapy/Group: Individual Therapy  Verl Dicker Verl Dicker PT, DPT  10/12/2022, 7:46 AM

## 2022-10-13 ENCOUNTER — Encounter: Payer: Self-pay | Admitting: Oncology

## 2022-10-13 LAB — GLUCOSE, CAPILLARY
Glucose-Capillary: 137 mg/dL — ABNORMAL HIGH (ref 70–99)
Glucose-Capillary: 140 mg/dL — ABNORMAL HIGH (ref 70–99)
Glucose-Capillary: 169 mg/dL — ABNORMAL HIGH (ref 70–99)
Glucose-Capillary: 170 mg/dL — ABNORMAL HIGH (ref 70–99)

## 2022-10-13 MED ORDER — SENNOSIDES-DOCUSATE SODIUM 8.6-50 MG PO TABS
2.0000 | ORAL_TABLET | Freq: Every day | ORAL | Status: DC
  Administered 2022-10-13 – 2022-10-17 (×5): 2 via ORAL
  Filled 2022-10-13 (×7): qty 2

## 2022-10-13 MED ORDER — DICLOFENAC SODIUM 1 % EX GEL
2.0000 g | Freq: Four times a day (QID) | CUTANEOUS | Status: DC
  Administered 2022-10-13 – 2022-10-28 (×48): 2 g via TOPICAL
  Filled 2022-10-13 (×3): qty 100

## 2022-10-13 MED ORDER — HYDROCORTISONE ACETATE 25 MG RE SUPP
25.0000 mg | Freq: Every day | RECTAL | Status: DC
  Filled 2022-10-13 (×3): qty 1

## 2022-10-13 MED ORDER — WITCH HAZEL-GLYCERIN EX PADS: MEDICATED_PAD | CUTANEOUS | Status: DC | PRN

## 2022-10-13 MED ORDER — ZINC SULFATE 220 (50 ZN) MG PO CAPS
220.0000 mg | ORAL_CAPSULE | Freq: Every day | ORAL | Status: DC
  Administered 2022-10-13 – 2022-10-28 (×16): 220 mg via ORAL
  Filled 2022-10-13 (×16): qty 1

## 2022-10-13 MED ORDER — POLYETHYLENE GLYCOL 3350 17 G PO PACK
17.0000 g | PACK | Freq: Every day | ORAL | Status: DC
  Administered 2022-10-13 – 2022-10-14 (×2): 17 g via ORAL
  Filled 2022-10-13 (×3): qty 1

## 2022-10-13 NOTE — Progress Notes (Signed)
Occupational Therapy Session Note  Patient Details  Name: Janice Short MRN: [id number] Date of Birth: [birth year]  Today's Date: 10/13/2022 OT Individual Time: 0800-0915 1st Session, 1300-1415 2nd session  OT Individual Time Calculation (min): 75 min, 75 min    Short Term Goals: Week 1:  OT Short Term Goal 1 (Week 1): Patient will maintain sitting balance at EOB with no more than min A with dynamic task OT Short Term Goal 2 (Week 1): Patient will be able to reach under R arm to wash armpit OT Short Term Goal 3 (Week 1): Patient will complete toilet transfer with mod A +2  Skilled Therapeutic Interventions/Progress Updates:   1st Session: Pt up and awake giving meal order to nutrition this am upon OT arrival. Pt reports having the enema and disimpaction last night and feeling much better. Pain overall 6/10 neck, shoulders, LBP and meds administered earlier. Pt requested female urinal to void bed level with mod A.  See Flowsheets. OT assisted with LB bathing for time management and need for total A however pt able to bathe peri region. New foam pad replaced to sacral breakdown. Aspen neck collar donned with total A. OT donned pants over feet and pt able to move to EOB with mod A. Pt applied deodorant with set up and pull over loose shirt with min A. Steady transfer with significant hips and knee flexion but able to come upright with max A. OT assisted to pull up pants and transfer to TIS. Sink side oral care with occ min A for container management and pt able to comb hair with set up. Set pt up with chair alarm set, needs and nurse call button in reach at end of session.    2nd Session:  Pt up in TIS w/c upon OT arrival. Very low to no pain reported. Pt requested toilet use. OT assisted TIS in and out of bathroom and to operate all parts for locking and arm rest management. Pt able to perform lateral scoot on and off wide DABSC with mod A. LB clothing management with max A, min A for peri and  buttocks hygiene. Modified pull to squat for pulling up pants with max A. TIS seated hand washing task with set up only using R UE primarily and L and as a functional assist. OT transported pt in TIS to middle gym for UE therex table top level. UE towel slides for forward/back/circles in both directions with no pain and full ROM gravity minimized. Instructed and issued foam block cube and tan (extra soft) theraputty for intrinsic strength training as well as 10 large bead retrieval with R UE primarily and L as functional assist. Very low grasp power demonstrated with L hand. OT concluded session with pt having visitors arrive bedside, chair alarm set and call button and needs placed in reach.   Therapy Documentation Precautions:  Precautions Precautions: Cervical, Fall Precaution Booklet Issued: No Precaution Comments: [place] Required Braces or Orthoses: Cervical Brace Cervical Brace: Hard collar Restrictions Weight Bearing Restrictions: No   Therapy/Group: Individual Therapy  Barnabas Lister 10/13/2022, 7:47 AM

## 2022-10-13 NOTE — Progress Notes (Signed)
Physical Therapy Session Note  Patient Details  Name: Janice Short MRN: [id number] Date of Birth: [birth year]  Today's Date: 10/13/2022 PT Individual Time: 8115-7262 PT Individual Time Calculation (min): 41 min   Short Term Goals: Week 1:  PT Short Term Goal 1 (Week 1): pt will perform supine to sit w/mod assist of 1 PT Short Term Goal 2 (Week 1): pt will perform bed to/from chair transfer w/LRAD w/mod assist of 1 PT Short Term Goal 3 (Week 1): Pt will request assit w/pressure relief consistently per schedule w/minimal cueing/reminders PT Short Term Goal 4 (Week 1): pt will perform sit to stand in LRAD mod assist of 2  Skilled Therapeutic Interventions/Progress Updates:      Therapy Documentation Precautions:  Precautions Precautions: Cervical, Fall Precaution Booklet Issued: No Precaution Comments: [place] Required Braces or Orthoses: Cervical Brace Cervical Brace: Hard collar Restrictions Weight Bearing Restrictions: No  Pt received seated in TIS w/c in good spirits and agreeable to PT session with emphasis on gait training. Pt with unrated back pain throughout session and provided with rest breaks and repositioning for relief. Pt transported by w/c to main gym and requires mod A with sit to stand in parallel bars with tactile cues and manual facilitation at gluteal musculature to facilitate upright posture. Pt ambulated 10 ft with min A in parallel bars and transitioned to gait with 3 Musketeer Strategy on R side and L HHA x 20 ft. Pt required seated rest break and progressed to gait training with RW min A x 20 ft. Pt requires frequent cues to maintain upright posture and to engage posterior chain musculature during gait. Pt transported to room and left seated in w/c at bedside with chair alarm on and all needs within reach.     Therapy/Group: Individual Therapy  Verl Dicker Verl Dicker PT, DPT  10/13/2022, 7:50 AM

## 2022-10-13 NOTE — Patient Care Conference (Signed)
Inpatient RehabilitationTeam Conference and Plan of Care Update Date: 10/13/2022   Time: 11:51 AM    Patient Name: Janice Short Record Number: [id number]  Date of Birth: 08-27-1961 Sex: Female         Room/Bed: 4W05C/4W05C-01 Payor Info: Payor: [employer] MEDICARE / Plan: [facility] MEDICARE / Product Type: *No Product type* /    Admit Date/Time:  10/08/2022  1:33 PM  Primary Diagnosis:  Acute incomplete quadriplegia [facility])  Hospital Problems: Principal Problem:   Acute incomplete quadriplegia ([place]) Active Problems:   Intrahepatic cholangiocarcinoma ([place])   Central cord syndrome ([place])   Neurogenic bowel   Pressure ulcer, stage 3 ([place])    Expected Discharge Date: Expected Discharge Date: 10/28/22  Team Members Present: Physician leading conference: Dr. Courtney Heys Social Worker Present: Ovidio Kin, LCSW Nurse Present: Tacy Learn, RN PT Present: Verl Dicker, PT OT Present: Jamey Ripa, OT PPS Coordinator present : Gunnar Fusi, SLP     Current Status/Progress Goal Weekly Team Focus  Bowel/Bladder   Contient of B/B   Remain continent   Assist with toileting as needed    Swallow/Nutrition/ Hydration               ADL's   L shoulder and rectal pain, bed level bathing/dressing with max A, UB bathing and pull over shirt mod A, grooming sink side set up and occ min A, SB transfer bed to TIS with min A,  Steady to commode   mod I w/c level, CGA toilet transfers   progress simple standing, progress commode transfers and trial shower    Mobility   mod A bed mobility, w/c<>mat transfers CGA with slide board and max A with squat pivot, mod A sit to stand , min x 20 ft with RW   mod I wc level, cga household ambulation w/LRAD  pt/fam education, bed mobility, transfers, gait    Communication                Safety/Cognition/ Behavioral Observations               Pain   C/O to bilateral shoulders, PRN meds available.   Pain <3/10    Monitor for effectiveness    Skin   St 3 to buttocks, sutured laceration to rt check, closed incision to left side of neck.   Promote wound healing and prevention of infection.  Assess Qshift and prn      Discharge Planning:  Home with daughter and 98 yo grandson daughter does work from home and can be there to assist. Pt hopes to be mod/i level. Neuro-psych to see   Team Discussion: Acute incomplete quad. Continent B/B with recent disimpaction. Pain managed with PRN medications. Stage III to buttocks that was POA. Labs tomorrow. WBC elevated. Keflex for UTI. Miralax added. Anusol suppository added. Chest xray ordered. Therapy progressing but felt family meeting needed.  Patient on target to meet rehab goals: yes, progressing out of stedy to ambulating with RW  *See Care Plan and progress notes for long and short-term goals.   Revisions to Treatment Plan:  Monitor labs, medication adjustments, family meeting  Teaching Needs: Medications, safety, gait/transfer training, skin/wound care, etc  Current Barriers to Discharge: Behavior/Mood and Self-care education   Possible Resolutions to Barriers: Family education, nursing education, order recommended DME     Medical Summary Current Status: WBC 19.6- CXR atalectasis/ - continent x2- but needs disimpaction last night- Stage III on Coccyx- rectal  spasms gone now- on Keflex for UTI  Barriers to Discharge: Other (comments);Behavior;Decreased family/caregiver support;Home Dentist;Neurogenic Bowel & Bladder;Medication compliance;Wound care;Medical stability  Barriers to Discharge Comments: 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 need to schedule- Is C4 ASIA C/D-  UB mod A; core real weak- d/c- 11/22- of note, was large smoker-   Continued Need for Acute Rehabilitation Level of Care: The patient  requires daily medical management by a physician with specialized training in physical medicine and rehabilitation for the following reasons: Direction of a multidisciplinary physical rehabilitation program to maximize functional independence : Yes Medical management of patient stability for increased activity during participation in an intensive rehabilitation regime.: Yes Analysis of laboratory values and/or radiology reports with any subsequent need for medication adjustment and/or medical intervention. : Yes   I attest that I was present, lead the team conference, and concur with the assessment and plan of the team.   Janice Short 10/13/2022, 5:46 PM

## 2022-10-13 NOTE — Progress Notes (Signed)
Patient ID: Janice Short, female   DOB: 01-08-1961, 61 y.o.   MRN: [id number]  Met with pt to discuss team conference goals of mod/I wheelchair and CGA level when up walking. [employer] discharge date of 1/22. Pt's main concern is getting home before Thanksgiving and getting a ride she wants ambulance to take her home. Tried to discuss having a family conference and she said daughter can't be called during the day when working. She thinks she is off tomorrow and can be reached then. When discussed family meeting she didn't quite understand the need for this. She feels she will ne better than the therapy team thinks she hopes to be walking at discharge, she knows she can't drive when goes home. Her son who does drive she reports works 2nd-3rd shift and needs his rest so she does not think he can come and transport her home. Tried to discuss family training prior to discharge and she felt not sure could do this due to children working. Will continue to try to schedule family conference.

## 2022-10-13 NOTE — Progress Notes (Signed)
PROGRESS NOTE   Subjective/Complaints:  Pt wants voltaren gel - uses at home for Hands and feet Had to be disimpacted yesterday- was very painful- had hemorrhoids as well.   Says working to make strength more.   ROS:   Pt denies SOB, abd pain, CP, N/V/C/D, and vision changes   Objective:   DG Chest 2 View  Result Date: 10/12/2022 CLINICAL DATA:  Leukocytosis EXAM: CHEST - 2 VIEW COMPARISON:  Chest x-ray October 07, 2022 FINDINGS: Right chest wall port catheter terminates in the right atrium. The cardiomediastinal silhouette is unchanged in contour. Right basilar bandlike opacity. No pleural effusion or pneumothorax. The visualized upper abdomen is unremarkable. No acute osseous abnormality. IMPRESSION: Right basilar bandlike opacity, likely atelectasis. However, aspiration/infection could appear similarly in the appropriate clinical context. Electronically Signed   By: Beryle Flock M.D.   On: 10/12/2022 15:37   Recent Labs    10/12/22 0537  WBC 19.6*  HGB 9.0*  HCT 27.7*  PLT 240   Recent Labs    10/12/22 0537  NA 137  K 4.3  CL 104  CO2 20*  GLUCOSE 94  BUN 13  CREATININE 1.47*  CALCIUM 7.9*    Intake/Output Summary (Last 24 hours) at 10/13/2022 0921 Last data filed at 10/13/2022 0035 Gross per 24 hour  Intake 594 ml  Output 800 ml  Net -206 ml     Pressure Injury 10/06/22 Buttocks Right Stage 3 -  Full thickness tissue loss. Subcutaneous fat may be visible but bone, tendon or muscle are NOT exposed. see picture in Epic (Active)  10/06/22 1130  Location: Buttocks  Location Orientation: Right  Staging: Stage 3 -  Full thickness tissue loss. Subcutaneous fat may be visible but bone, tendon or muscle are NOT exposed.  Wound Description (Comments): see picture in Epic  Present on Admission: Yes    Physical Exam: Vital Signs Blood pressure 116/62, pulse 81, temperature 98.4 F (36.9 C), resp. rate 18,  height 5\' 7"  (1.702 m), weight 82.4 kg, SpO2 97 %.     General: awake, alert, appropriate, sitting u pin bed; appears more comfortable; NAD HENT: conjugate gaze; oropharynx moist- cheek sutures look good CV: regular rate; no JVD Pulmonary: CTA B/L; no W/R/R- good air movement GI: soft, not distended; NT; normoactive BS Psychiatric: appropriate- intermittently tearful about disimpaction Neurological: Ox3 Musculoskeletal:     Comments: RUE- Bicep 4+/5; triceps 4/5; WE 4-/5; grip 4-/5; FA 4-/5 LUE- Biceps 4-/5; WE 3-/5; Triceps 2/5; Grip 2-/5 and FA 1/5- no real change today on exam RLE- HF, KE 4/5; DF/PF and EHL 4+/5 LLE- DF/PF 4/5 and EHL 4-/5- pt unable to let me uncover her again, she's so cold to test rest of LLE  Skin:    General: Skin is warm and dry.     Comments: Skin laceration to left facial cheek with stitches in place.  ACDF site clean and dry Stage III- superficial stage III 1.3 x 1.0 x 0.5 cm on coccyx with a few tiny spots that are more superficial around it   Neurological:     Mental Status: She is alert.     Comments: Patient is alert and  oriented x3.  Follows full commands. Ox3 No abnl tone or clonus RLE/LLE Assessment/Plan: 1. Functional deficits which require 3+ hours per day of interdisciplinary therapy in a comprehensive inpatient rehab setting. Physiatrist is providing close team supervision and 24 hour management of active medical problems listed below. Physiatrist and rehab team continue to assess barriers to discharge/monitor patient progress toward functional and medical goals  Care Tool:  Bathing    Body parts bathed by patient: Chest, Front perineal area, Abdomen, Face, Left arm, Right upper leg, Left upper leg   Body parts bathed by helper: Right arm, Right lower leg, Left lower leg, Buttocks     Bathing assist Assist Level: Moderate Assistance - Patient 50 - 74%     Upper Body Dressing/Undressing Upper body dressing   What is the patient  wearing?: Pull over shirt    Upper body assist Assist Level: Moderate Assistance - Patient 50 - 74%    Lower Body Dressing/Undressing Lower body dressing      What is the patient wearing?: Underwear/pull up, Incontinence brief     Lower body assist Assist for lower body dressing: Total Assistance - Patient < 25%     Toileting Toileting    Toileting assist Assist for toileting: 2 Helpers     Transfers Chair/bed transfer  Transfers assist     Chair/bed transfer assist level: 2 Helpers     Locomotion Ambulation   Ambulation assist   Ambulation activity did not occur: Safety/medical concerns          Walk 10 feet activity   Assist  Walk 10 feet activity did not occur: Safety/medical concerns        Walk 50 feet activity   Assist Walk 50 feet with 2 turns activity did not occur: Safety/medical concerns         Walk 150 feet activity   Assist Walk 150 feet activity did not occur: Safety/medical concerns         Walk 10 feet on uneven surface  activity   Assist Walk 10 feet on uneven surfaces activity did not occur: Safety/medical concerns         Wheelchair     Assist Is the patient using a wheelchair?: Yes Type of Wheelchair: Manual    Wheelchair assist level: Dependent - Patient 0%      Wheelchair 50 feet with 2 turns activity    Assist        Assist Level: Dependent - Patient 0%   Wheelchair 150 feet activity     Assist      Assist Level: Dependent - Patient 0%   Blood pressure 116/62, pulse 81, temperature 98.4 F (36.9 C), resp. rate 18, height 5\' 7"  (1.702 m), weight 82.4 kg, SpO2 97 %.  Medical Problem List and Plan: 1. 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  Status post ACDF C4-5 10/02/2022 per Dr.Yarbrough.  Cervical collar  when out of bed applied in sitting position             -patient may  shower- monitor skin             -ELOS/Goals: 3-4 weeks- min A hopefully  Con't CIR- PT, and OT- team conference today to determine length of stay 2.  Antithrombotics: -DVT/anticoagulation:  Pharmaceutical: Lovenox.  Venous Doppler studies 10/07/2022 negative             -  antiplatelet therapy: N/A 3. Pain Management: Neurontin 300 mg twice daily, Robaxin and oxycodone as needed  11/3- pain 8-9/10 this Am, but was due for pain meds- con't regimen for now- will increase to 600 mg BID but no higher due to some elevation/Cr of 1.4-1.5  11/6- pain controlled- con't regimen  11/7- will add voltaren gel 2G QID for hands/feet 4. Mood/Behavior/Sleep: Lexapro 25 mg daily             -antipsychotic agents: N/A 5. Neuropsych/cognition: This patient is capable of making decisions on her own behalf. 6. Skin/Wound Care left facial cheek laceration: Status post sutures received in ED.  Routine skin checks 7. Fluids/Electrolytes/Nutrition:   -will adv to regular diet. She can make choices as to what she can chew or not 8.  Diabetes mellitus.  Hemoglobin A1c 5.9.  Currently maintained on Semglee 8 units daily.  Patient on Tresiba 8 units daily prior to admission  11/6- CBG's labile- 96-222-  9.  AKI on CKD stage III.  Baseline creatinine 1.18-1.26.  Follow-up chemistries  11/3-4- Cr up to 1.41- will recheck Monday and push fluids 10.  Acute on chronic anemia.  Follow-up CBC 11.  Intrahepatic cholangiocarcinoma.  Patient on chemotherapy followed by Dr. Randa Evens.  Follow-up outpatient- port not accessed- missed last chemo treatment-her car broke down- will need to be here 3-4 weeks.   11/3- supposedly got chemo q2 weeks- will need to check with Oncology what to do while here. D/w PA  11/6- they will restart once leaves rehab 12.  Obesity.  BMI 27.62.  Dietary follow-up 13.  Hypertension.  Norvasc 2.5 mg daily.  Monitor with increased  mobility 14.  History of tobacco use/COPD.  Continue inhalers as indicated 15.  Hyperlipidemia.  Lipitor 16. Complicated UTI.Empiric IV Rocephin 10/08/2022 changed to keflex for 7 days- pending culture, hopefully- will monitor  11/6- ON Keflex for Proteus- is sensitive-  17. Neurogenic bladder- will do bladder scans q6 hours and cath if volumes >350cc 18. Neurogenic bowel with constipation- will get cleaned out- Sorbitol 45cc now and follow with SSE if no results- might need bowel program- not clear yet.   11/3- cleaned out- was continent  11/7- last night, required a full disimpaction- has hemorrhoids- will treat- will also change Senna to qAM and add Miralax daily.  19. Stage III coccyx pressure ulcer- on admission- will automatically get WOC consult and determine if needs air mattress, place foam for the moment and turn often- will order turning q2 hours  11/3- will make sure she's on ROHO- seen by WOC- started aquacel with foam dressing to change daily.  20. Shivering/cold- will order kpad to warm her up and to help back pain.   11/3- resolved 21. Leukocytosis- worsening  11/6- no fever- WBC 19.6- up from 14k- will order CXR; also wondering if her cancer treatment, or missing a chemo dose might play a part?- I think it's low chance, but have spoken with PA to look into further w/u.   11/7- labs in AM- severe need for disimpaction might have played a role, but not cause of WBC. 22. Hemorrhoids  11/7- adding Anusol supp after dinner at 6pm daily.    I spent a total of 43   minutes on total care today- >50% coordination of care- due to team conference to determine length of stay as well as d/w nursing about disimpaction required last evening.      LOS: 5 days A FACE TO FACE EVALUATION WAS PERFORMED  Dalary Hollar 10/13/2022, 9:21 AM

## 2022-10-13 NOTE — Consult Note (Signed)
Neuropsychological Consultation   Patient:   Janice Short   DOB:   06/06/1961  MR Number:  [id number]  Location:  [facility] [id number] [place] [address] Dept: [place]: [phone]           Date of Service:   10/13/2022  Start Time:   3 PM End Time:   4 PM  Provider/Observer:  Ilean Skill, Psy.D.       Clinical Neuropsychologist       Billing Code/Service: [phone]  Chief Complaint:    Janice Short is a 61 year old female referred for neuropsychological consultation due to coping and adjustment following cervical spine surgery due to stenosis and referral admission onto comprehensive inpatient rehabilitation program.  Patient has a past medical history including diabetes, chronic kidney disease stage III, chronic anemia, chronic bronchitis, intrahepatic cholangiocarcinoma on chemotherapy with most recent infusion on 09/18/2022.  Patient also has a history of hypertension, lipidemia, gout, obesity, tobacco use, carpal tunnel on left wrist.  Patient presented to [facility] on 10/01/2022 following a mechanical fall when she tripped and fell backwards striking her head on the railing.  Patient reports that she was dazed and this fall and did not have the strength to get up.  This is likely contributed to by her ongoing chemotherapy treatments.  Patient was unable to help herself up.  Patient sustained a laceration on the left cheek and complains of left hip pain and bilateral upper extremity numbness.  CT cervical spine findings were consistent with nondisplaced teardrop type fracture involving the anterior inferior aspect of the C2 vertebral body as well as severe stenosis C4-5 with myelomalacia.  Neurosurgery consulted and underwent decompression/fusion C4-C5.  Patient was referred to comprehensive inpatient rehabilitation due to decreased functional mobility following  neurosurgical interventions for cervical stenosis.  Reason for Service:  Patient was referred for neuropsychological consultation due to coping and adjustment with extended hospital stay.  Below is the HPI for the current admission.  HPI: Janice Short is a 61 year old right-handed female with history of diabetes mellitus, CKD stage III, chronic anemia, chronic bronchitis, intrahepatic cholangiocarcinoma on chemotherapy last infused 09/18/2022 followed by Janice Short, hypertension, hyperlipidemia, gout, obesity with BMI 27.62, tobacco use, carpal tunnel syndrome on left.  Per chart review patient lives with daughter and 72-year-old grandson.  Mobile home with ramped entrance.  Reportedly independent prior to admission.  Presented to [facility] 10/01/2022 following mechanical fall when she tripped, fell backwards striking her face on a railing.  She was unable to help herself up.  She sustained a laceration to the left cheek and complained of left hip pain and bilateral upper extremity numbness.  Admission chemistries unremarkable except potassium 5.7, creatinine 1.30, GFR 46, calcium 7.9, glucose 243, hemoglobin 9.7, WBC 12,200.  Patient's laceration to left cheek was sutured in the ED.  Cranial CT scan negative for intracranial abnormality.  CT maxillofacial negative.  CT left hip negative for fracture.  CT cervical spine findings consistent with nondisplaced teardrop type fracture involving the anterior inferior aspect of the C2 vertebral body as well as severe stenosis C4-5 with myelomalacia.  Neurosurgery consulted underwent anterior cervical discectomy decompression and fusion/cervical instrumentation C4-5 10/02/2022 per Dr. Izora Ribas.  Hard cervical collar when out of bed and can be applied in the sitting position.  Hospital course AKI on CKD stage III baseline serum creatinine 1.18 > 1.26.  Noted hyperkalemia 5.7 responded well  to [facility] with latest potassium 4.2 and creatinine 1.50.  Leukocytosis 21,600  improved to 14,000.  Acute on chronic anemia latest hemoglobin 9.6.  She was cleared to begin Lovenox for DVT prophylaxis.  Spiked a low-grade fever 101.1 10/07/2022 with venous Doppler studies negative, chest x-ray showed no acute disease, urinalysis negative nitrite WBC improved at 14,000 down from 17,200.Marland KitchenPlaced empirically on Rocephin 10/08/2022 changed to Keflex for 7 days- changed since likely has neurogenic bladder.   Therapy evaluations completed due to patient decreased functional mobility was admitted for a comprehensive rehab program.   Current Status:  Patient was awake and alert sitting up in her wheelchair with 3 of her sisters present and 1 working on doing her hair.  Patient requested that they stay in the room while we have our discussion and we were able to have a frank and open discussion about what all transpired with her injury and the coping and adjustment issues that she had been experiencing during her hospital stay.  The patient reports that she is having a good mood state and is seeing significant improvements in progression during her CIR efforts.  Patient reports that she remains motivated.  Patient denies any significant depression or anxiety type symptoms that are complicating her efforts and therapeutic interventions.  Patient denies any exacerbation of her premorbid depressive types of symptoms.  Patient was oriented with good mental status and was alert with good cognition.  Behavioral Observation: Janice Short  presents as a 61 y.o.-year-old Right handed African American Female who appeared her stated age. her dress was Appropriate and she was Well Groomed and her manners were Appropriate to the situation.  her participation was indicative of Appropriate and Redirectable behaviors.  There were physical disabilities noted and she was wearing a hard neck collar.  she displayed an appropriate level of cooperation and motivation.     Interactions:    Active  Appropriate  Attention:   within normal limits and attention span and concentration were age appropriate  Memory:   within normal limits; recent and remote memory intact  Visuo-spatial:  not examined  Speech (Volume):  normal  Speech:   normal; normal  Thought Process:  Coherent and Relevant  Though Content:  WNL; not suicidal and not homicidal  Orientation:   person, place, time/date, and situation  Judgment:   Fair  Planning:   Fair  Affect:    Appropriate  Mood:    Euthymic  Insight:   Fair  Intelligence:   normal  M Medical History:   Past Medical History:  Diagnosis Date   Allergy    Asthma    Carpal tunnel syndrome on left    Cervical radiculitis    Chronic osteoarthritis    Corns and callosity    Depression    Dermatophytosis of foot    Diabetes mellitus without complication ([place])    Gout    Hyperlipidemia    Hypertension    Impingement syndrome of left shoulder    Intrahepatic cholangiocarcinoma (HCC)    Lumbosacral neuritis    Microalbuminuria    Obesity    Supraventricular tachycardia 07/01/2015   SVT (supraventricular tachycardia)    Tenosynovitis of wrist    Vitamin D deficiency          Patient Active Problem List   Diagnosis Date Noted   Acute incomplete quadriplegia ([place]) 10/08/2022   Neurogenic bowel 10/08/2022   Pressure ulcer, stage 3 ([place]) 10/08/2022   Fever 10/07/2022   Head injury  Pressure injury of skin 10/06/2022   Central cord syndrome ([place]) 10/02/2022   Cervical spine instability 10/02/2022   Fall 10/01/2022   Closed fracture of C2 vertebra ([place]) 10/01/2022   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[place]) 10/01/2022   Hyperkalemia 10/01/2022   Stage 3a chronic kidney disease ([place]) 10/01/2022   Left hip pain    Laceration of cheek without complication, left, initial encounter    Moderate protein-calorie malnutrition ([place]) 09/25/2022   Simple chronic bronchitis ([place]) 09/25/2022   Tobacco use 09/25/2022   Rectal polyp     Polyp of sigmoid colon    Polyp of duodenum    Intrahepatic cholangiocarcinoma ([place]) 04/12/2021   Goals of care, counseling/discussion 04/12/2021   Mild episode of recurrent major depressive disorder ([place]) 02/08/2019   Uncontrolled type 2 diabetes mellitus with hyperglycemia, with long-term current use of insulin ([place]) 12/17/2017   Spinal stenosis in cervical region 10/15/2016   Diabetic polyneuropathy associated with type 2 diabetes mellitus ([place]) 07/14/2016   Chronic midline low back pain with sciatica 06/22/2016   Primary osteoarthritis of both knees 06/22/2016   Carpal tunnel syndrome 07/01/2015   Cervical radiculitis 07/01/2015   Corn or callus 07/01/2015   Impingement syndrome of shoulder 07/01/2015   Microalbuminuria 07/01/2015   Tenosynovitis of wrist 07/01/2015   Hypertension 11/05/2009   Athlete's foot 08/05/2009   Vitamin D deficiency 01/08/2009   Allergic rhinitis 12/28/2008   Lumbosacral neuritis 08/11/2007    Psychiatric History:  Patient has a history of depressive disorder although she denies acute issues with that currently.  The patient denies any current depressive symptomatology and she is having appropriate coping response to recent fall, surgery and extended hospital stay.  Patient reports that she continues to take Lexapro and has been kept on that medication during her hospital stay.  Family Med/Psych History:  Family History  Problem Relation Age of Onset   Heart attack Mother    CAD Mother    Kidney disease Father     Impression/DX:  Janice Short is a 61 year old female referred for neuropsychological consultation due to coping and adjustment following cervical spine surgery due to stenosis and referral admission onto comprehensive inpatient rehabilitation program.  Patient has a past medical history including diabetes, chronic kidney disease stage III, chronic anemia, chronic bronchitis, intrahepatic cholangiocarcinoma on chemotherapy with most recent  infusion on 09/18/2022.  Patient also has a history of hypertension, lipidemia, gout, obesity, tobacco use, carpal tunnel on left wrist.  Patient presented to [facility] on 10/01/2022 following a mechanical fall when she tripped and fell backwards striking her head on the railing.  Patient reports that she was dazed and this fall and did not have the strength to get up.  This is likely contributed to by her ongoing chemotherapy treatments.  Patient was unable to help herself up.  Patient sustained a laceration on the left cheek and complains of left hip pain and bilateral upper extremity numbness.  CT cervical spine findings were consistent with nondisplaced teardrop type fracture involving the anterior inferior aspect of the C2 vertebral body as well as severe stenosis C4-5 with myelomalacia.  Neurosurgery consulted and underwent decompression/fusion C4-C5.  Patient was referred to comprehensive inpatient rehabilitation due to decreased functional mobility following neurosurgical interventions for cervical stenosis.  Patient was awake and alert sitting up in her wheelchair with 3 of her sisters present and 1 working on doing her hair.  Patient requested that they stay in the room while we have our  discussion and we were able to have a frank and open discussion about what all transpired with her injury and the coping and adjustment issues that she had been experiencing during her hospital stay.  The patient reports that she is having a good mood state and is seeing significant improvements in progression during her CIR efforts.  Patient reports that she remains motivated.  Patient denies any significant depression or anxiety type symptoms that are complicating her efforts and therapeutic interventions.  Patient denies any exacerbation of her premorbid depressive types of symptoms.  Patient was oriented with good mental status and was alert with good cognition.  Disposition/Plan:  Worked on  coping and adjustment issues as well as answering patient's questions.  The patient's 3 sisters also had numerous questions that were answered as well as I could with permission of the patient herself.          Electronically Signed   _______________________ Ilean Skill, Psy.D. Clinical Neuropsychologist

## 2022-10-14 LAB — CBC WITH DIFFERENTIAL/PLATELET
Abs Immature Granulocytes: 0.12 10*3/uL — ABNORMAL HIGH (ref 0.00–0.07)
Basophils Absolute: 0 10*3/uL (ref 0.0–0.1)
Basophils Relative: 0 %
Eosinophils Absolute: 0.1 10*3/uL (ref 0.0–0.5)
Eosinophils Relative: 1 %
HCT: 27.3 % — ABNORMAL LOW (ref 36.0–46.0)
Hemoglobin: 8.9 g/dL — ABNORMAL LOW (ref 12.0–15.0)
Immature Granulocytes: 1 %
Lymphocytes Relative: 12 %
Lymphs Abs: 1.7 10*3/uL (ref 0.7–4.0)
MCH: 30.8 pg (ref 26.0–34.0)
MCHC: 32.6 g/dL (ref 30.0–36.0)
MCV: 94.5 fL (ref 80.0–100.0)
Monocytes Absolute: 1.5 10*3/uL — ABNORMAL HIGH (ref 0.1–1.0)
Monocytes Relative: 10 %
Neutro Abs: 10.8 10*3/uL — ABNORMAL HIGH (ref 1.7–7.7)
Neutrophils Relative %: 76 %
Platelets: 204 10*3/uL (ref 150–400)
RBC: 2.89 MIL/uL — ABNORMAL LOW (ref 3.87–5.11)
RDW: 20 % — ABNORMAL HIGH (ref 11.5–15.5)
WBC: 14.2 10*3/uL — ABNORMAL HIGH (ref 4.0–10.5)
nRBC: 0 % (ref 0.0–0.2)

## 2022-10-14 LAB — BASIC METABOLIC PANEL
Anion gap: 4 — ABNORMAL LOW (ref 5–15)
BUN: 12 mg/dL (ref 8–23)
CO2: 24 mmol/L (ref 22–32)
Calcium: 7.7 mg/dL — ABNORMAL LOW (ref 8.9–10.3)
Chloride: 111 mmol/L (ref 98–111)
Creatinine, Ser: 1.35 mg/dL — ABNORMAL HIGH (ref 0.44–1.00)
GFR, Estimated: 44 mL/min — ABNORMAL LOW (ref >60)
Glucose, Bld: 77 mg/dL (ref 70–99)
Potassium: 4 mmol/L (ref 3.5–5.1)
Sodium: 139 mmol/L (ref 135–145)

## 2022-10-14 LAB — GLUCOSE, CAPILLARY
Glucose-Capillary: 82 mg/dL (ref 70–99)
Glucose-Capillary: 168 mg/dL — ABNORMAL HIGH (ref 70–99)
Glucose-Capillary: 138 mg/dL — ABNORMAL HIGH (ref 70–99)
Glucose-Capillary: 155 mg/dL — ABNORMAL HIGH (ref 70–99)

## 2022-10-14 MED ORDER — LIDOCAINE 5 % EX OINT
TOPICAL_OINTMENT | Freq: Once | CUTANEOUS | Status: AC
  Filled 2022-10-14 (×2): qty 35.44

## 2022-10-14 NOTE — Progress Notes (Signed)
Patient ID: Janice Short, female   DOB: 10/16/1961, 61 y.o.   MRN: [id number]  Met with pt and attempted to contact her daughter since off today to discuss having a family conference. Pt feels with her doing so well it is not needed and she will go home before the set discharge due to her progress. Will inform team and MD no family conference tomorrow due to unable to schedule with daughter. 

## 2022-10-14 NOTE — Progress Notes (Signed)
Physical Therapy Session Note  Patient Details  Name: Janice Short MRN: [id number] Date of Birth: Jan 10, 1961  Today's Date: 10/14/2022 PT Individual Time: 0800-0900 PT Individual Time Calculation (min): 60 min   Short Term Goals: Week 1:  PT Short Term Goal 1 (Week 1): pt will perform supine to sit w/mod assist of 1 PT Short Term Goal 2 (Week 1): pt will perform bed to/from chair transfer w/LRAD w/mod assist of 1 PT Short Term Goal 3 (Week 1): Pt will request assit w/pressure relief consistently per schedule w/minimal cueing/reminders PT Short Term Goal 4 (Week 1): pt will perform sit to stand in LRAD mod assist of 2  Skilled Therapeutic Interventions/Progress Updates: Pt presents semi-reclined in bed and agreeable to therapy, although reluctant to perform w/ female.  Pt required min A to don pullover shirt over  head and then does her way to get arms in to sleeves.  Pt very upset about possibly exposing self to female. C-Collar donned in supine.  Pt transfers sup to sit from elevated head and CGA.  Pt transferred sit to stand from bed to [facility] w/ mod A and verbal cues .  Pt wheeled to BR and required max A for safe eccentric transition to sitting on commode, + 2 of NT.  Pt required mod A + 2 sit to stand from [facility].  Pt stood for pericare total A, but flexed posture.  Pt then repositioned to upright stance w/ cues for glut activation.  Pt requires mod to min A from elevated [place] perch.  Pt returned to [place].  Pt performed mouth hygiene seated at sink reaching forward for items, then combed hair.  Pt remained sitting in TIS for next session.  All needs in reach.     Therapy Documentation Precautions:  Precautions Precautions: Cervical, Fall Precaution Booklet Issued: No Precaution Comments: [place] Required Braces or Orthoses: Cervical Brace Cervical Brace: Hard collar Restrictions Weight Bearing Restrictions: No General:   Vital Signs:  Pain:0/10      Therapy/Group: Individual  Therapy  Ladoris Gene 10/14/2022, 12:15 PM

## 2022-10-14 NOTE — Evaluation (Signed)
Recreational Therapy Assessment and Plan  Patient Details  Name: Janice Short MRN: [id number] Date of Birth: 03-10-1961 Today's Date: 10/14/2022  Rehab Potential:  good  ELOS:   d/c 11/22  Assessment  Hospital Problem: Principal Problem:   Acute incomplete quadriplegia ([place]) Active Problems:   Intrahepatic cholangiocarcinoma ([place])   Central cord syndrome ([place])   Neurogenic bowel   Pressure ulcer, stage 3 ([place])     Past Medical History:      Past Medical History:  Diagnosis Date   Allergy     Asthma     Carpal tunnel syndrome on left     Cervical radiculitis     Chronic osteoarthritis     Corns and callosity     Depression     Dermatophytosis of foot     Diabetes mellitus without complication ([place])     Gout     Hyperlipidemia     Hypertension     Impingement syndrome of left shoulder     Intrahepatic cholangiocarcinoma ([facility])     Lumbosacral neuritis     Microalbuminuria     Obesity     Supraventricular tachycardia 07/01/2015   SVT (supraventricular tachycardia)     Tenosynovitis of wrist     Vitamin D deficiency      Past Surgical History:       Past Surgical History:  Procedure Laterality Date   ABDOMINAL HYSTERECTOMY       ANTERIOR CERVICAL DECOMP/DISCECTOMY FUSION N/A 10/02/2022    Procedure: ANTERIOR CERVICAL DECOMPRESSION/DISCECTOMY FUSION 1 LEVEL;  Surgeon: Meade Maw, MD;  Location: [facility] ORS;  Service: Neurosurgery;  Laterality: N/A;  ACDF C4-5.   COLONOSCOPY WITH PROPOFOL N/A 05/06/2021    Procedure: COLONOSCOPY WITH PROPOFOL;  Surgeon: Lucilla Lame, MD;  Location: [facility] ENDOSCOPY;  Service: Endoscopy;  Laterality: N/A;   ESOPHAGOGASTRODUODENOSCOPY (EGD) WITH PROPOFOL N/A 05/06/2021    Procedure: ESOPHAGOGASTRODUODENOSCOPY (EGD) WITH PROPOFOL;  Surgeon: Lucilla Lame, MD;  Location: [facility] ENDOSCOPY;  Service: Endoscopy;  Laterality: N/A;   PORTA CATH INSERTION N/A 07/17/2021    Procedure: PORTA CATH INSERTION;  Surgeon: Algernon Huxley, MD;  Location:  [facility] LAB;  Service: Cardiovascular;  Laterality: N/A;      Assessment & Plan Clinical Impression: Patient is a 61 y.o. year old female with recent admission to the hospital on ith history of diabetes mellitus, CKD stage III, chronic anemia, chronic bronchitis, intrahepatic cholangiocarcinoma on chemotherapy last infused 09/18/2022 followed by [name], hypertension, hyperlipidemia, gout, obesity with BMI 27.62, tobacco use, carpal tunnel syndrome on left.  Per chart review patient lives with daughter and 89-year-old grandson.  Mobile home with ramped entrance.  Reportedly independent prior to admission.  Presented to [facility] 10/01/20[facility] following mechanical fall when she tripped, fell backwards striking her face on a railing.  She was unable to help herself up.  She sustained a laceration to the left cheek and complained of left hip pain and bilateral upper extremity numbness.  Admission chemistries unremarkable except potassium 5.7, creatinine 1.30, GFR 46, calcium 7.9, glucose 243, hemoglobin 9.7, WBC 12,200.  Patient's laceration to left cheek was sutured in the ED.  Cranial CT scan negative for intracranial abnormality.  CT maxillofacial negative.  CT left hip negative for fracture.  CT cervical spine findings consistent with nondisplaced teardrop type fracture involving the anterior inferior aspect of the C2 vertebral body as well as severe stenosis C4-5 with myelomalacia.  Neurosurgery consulted underwent anterior cervical discectomy decompression and fusion/cervical instrumentation C4-5 10/02/2022 per  Dr. Izora Ribas.  Hard cervical collar when out of bed and can be applied in the sitting position.  Hospital course AKI on CKD stage III baseline serum creatinine 1.18 > 1.26.  Noted hyperkalemia 5.7 responded well to [facility] with latest potassium 4.2 and creatinine 1.50.  Leukocytosis 21,600 improved to 14,000.  Acute on chronic anemia latest hemoglobin 9.6.  She was cleared to begin Lovenox for  DVT prophylaxis.  Spiked a low-grade fever 101.1 10/07/2022 with venous Doppler studies negative, chest x-ray showed no acute disease, urinalysis negative nitrite WBC improved at 14,000 down from 17,200.Marland KitchenPlaced empirically on Rocephin 10/08/2022 changed to Keflex for 7 days- changed since likely has neurogenic bladder.    patient transferred to CIR on 10/08/2022 .     Pt presents with decreased activity tolerance, decreased functional mobility, decreased balance, decreased coordination, feelings of stress/anxiety Limiting pt's independence with leisure/community pursuits.  Met with pt today to discuss TR services including leisure education, activity analysis/modifications and stress management.  Also discussed the importance of social, emotional, spiritual health in addition to physical health and their effects on overall health and wellness.  Pt stated understanding.  Plan  Min 1 TR session >20 minutes during LOS  Recommendations for other services: Neuropsych  Discharge Criteria: Patient will be discharged from TR if patient refuses treatment 3 consecutive times without medical reason.  If treatment goals not met, if there is a change in medical status, if patient makes no progress towards goals or if patient is discharged from hospital.  The above assessment, treatment plan, treatment alternatives and goals were discussed and mutually agreed upon: by patient  [place] 10/14/2022, 2:59 PM

## 2022-10-14 NOTE — Progress Notes (Signed)
Right laceration sutures removed per order. Lidocaine used for numbing. Medication effective. Pt tolerated well.  Sheela Stack, LPN

## 2022-10-14 NOTE — Progress Notes (Signed)
Physical Therapy Session Note  Patient Details  Name: Janice Short MRN: [id number] Date of Birth: 29-Apr-1961  Today's Date: 10/14/2022 PT Individual Time: 0930-1046 PT Individual Time Calculation (min): 76 min   Short Term Goals: Week 1:  PT Short Term Goal 1 (Week 1): pt will perform supine to sit w/mod assist of 1 PT Short Term Goal 2 (Week 1): pt will perform bed to/from chair transfer w/LRAD w/mod assist of 1 PT Short Term Goal 3 (Week 1): Pt will request assit w/pressure relief consistently per schedule w/minimal cueing/reminders PT Short Term Goal 4 (Week 1): pt will perform sit to stand in LRAD mod assist of 2  Skilled Therapeutic Interventions/Progress Updates:      Therapy Documentation Precautions:  Precautions Precautions: Cervical, Fall Precaution Booklet Issued: No Precaution Comments: [place] Required Braces or Orthoses: Cervical Brace Cervical Brace: Hard collar Restrictions Weight Bearing Restrictions: No  Pt received seated in w/c at bedside agreeable to PT session with emphasis on gait training and LE strengthening. Pt declines pain. Pt transported by w/c for time management and energy conservation. Pt requires mod A for sit to stand from TIS w/c and min A for gait x 55 ft. Pt requires max A for posterior loss of balance with turining in gait due to narrow base of support and scissoring with turn. Pt transitioned to mat strengthening exercises and requires mod A for squat pivot and supervision for sit to lying. Pt performed gluteal bridges 3 x 10 with 5 second holds and requires verbal cues for technique. Pt transitioned to resisted 8 # gluteal bridges 2 x 8 with 5 second holds to facilitate posterior chain activation. Pt requires supervision for lying to sit and performed 2 x 8 sit to stand from elevated mat of 20 inches with min A. Pt requires mod A with squat pivot transfer to w/c from mat and transported to room and left seated in w/c at bedside with all needs in reach  and chair alarm on.    Therapy/Group: Individual Therapy  Verl Dicker Verl Dicker PT, DPT  10/14/2022, 7:30 AM

## 2022-10-14 NOTE — Progress Notes (Signed)
PROGRESS NOTE   Subjective/Complaints:  Pt reports moving and working on strength esp of LUE many times per day.  Slept well.  Pain meds help - has a "little pain".     ROS:   Pt denies SOB, abd pain, CP, N/V/C/D, and vision changes   Objective:   No results found. Recent Labs    10/12/22 0537 10/14/22 0544  WBC 19.6* 14.2*  HGB 9.0* 8.9*  HCT 27.7* 27.3*  PLT 240 204   Recent Labs    10/12/22 0537 10/14/22 0544  NA 137 139  K 4.3 4.0  CL 104 111  CO2 20* 24  GLUCOSE 94 77  BUN 13 12  CREATININE 1.47* 1.35*  CALCIUM 7.9* 7.7*    Intake/Output Summary (Last 24 hours) at 10/14/2022 1844 Last data filed at 10/14/2022 1809 Gross per 24 hour  Intake 837 ml  Output 150 ml  Net 687 ml     Pressure Injury 10/06/22 Buttocks Right Stage 3 -  Full thickness tissue loss. Subcutaneous fat may be visible but bone, tendon or muscle are NOT exposed. see picture in Epic (Active)  10/06/22 1130  Location: Buttocks  Location Orientation: Right  Staging: Stage 3 -  Full thickness tissue loss. Subcutaneous fat may be visible but bone, tendon or muscle are NOT exposed.  Wound Description (Comments): see picture in Epic  Present on Admission: Yes    Physical Exam: Vital Signs Blood pressure 124/63, pulse 81, temperature 98 F (36.7 C), temperature source Oral, resp. rate 16, height 5\' 7"  (1.702 m), weight 82.4 kg, SpO2 100 %.      General: awake, alert, appropriate, supine in bed; doing HEP of LUE; NAD HENT: conjugate gaze; oropharynx moist CV: regular rate; no JVD Pulmonary: CTA B/L; no W/R/R- good air movement GI: soft, NT, ND, (+)BS Psychiatric: appropriate Neurological: Ox3 Musculoskeletal:     Comments: RUE- Bicep 4+/5; triceps 4/5; WE 4-/5; grip 4-/5; FA 4-/5 LUE- Biceps 4-/5; WE 3-/5; Triceps 2/5; Grip 2-/5 and FA 1/5- no real change today on exam RLE- HF, KE 4/5; DF/PF and EHL 4+/5 LLE- DF/PF 4/5  and EHL 4-/5- pt unable to let me uncover her again, she's so cold to test rest of LLE  Skin:    General: Skin is warm and dry.     Comments: Skin laceration to left facial cheek with stitches in place.  ACDF site clean and dry Stage III- superficial stage III 1.3 x 1.0 x 0.5 cm on coccyx with a few tiny spots that are more superficial around it   Neurological:     Mental Status: She is alert.     Comments: Patient is alert and oriented x3.  Follows full commands. Ox3 No abnl tone or clonus RLE/LLE Assessment/Plan: 1. Functional deficits which require 3+ hours per day of interdisciplinary therapy in a comprehensive inpatient rehab setting. Physiatrist is providing close team supervision and 24 hour management of active medical problems listed below. Physiatrist and rehab team continue to assess barriers to discharge/monitor patient progress toward functional and medical goals  Care Tool:  Bathing    Body parts bathed by patient: Chest, Front perineal area, Abdomen, Face, Left  arm, Right upper leg, Left upper leg   Body parts bathed by helper: Right arm, Right lower leg, Left lower leg, Buttocks     Bathing assist Assist Level: Moderate Assistance - Patient 50 - 74%     Upper Body Dressing/Undressing Upper body dressing   What is the patient wearing?: Pull over shirt    Upper body assist Assist Level: Moderate Assistance - Patient 50 - 74%    Lower Body Dressing/Undressing Lower body dressing      What is the patient wearing?: Underwear/pull up, Incontinence brief     Lower body assist Assist for lower body dressing: Total Assistance - Patient < 25%     Toileting Toileting    Toileting assist Assist for toileting: 2 Helpers     Transfers Chair/bed transfer  Transfers assist     Chair/bed transfer assist level: 2 Helpers     Locomotion Ambulation   Ambulation assist   Ambulation activity did not occur: Safety/medical concerns          Walk 10 feet  activity   Assist  Walk 10 feet activity did not occur: Safety/medical concerns        Walk 50 feet activity   Assist Walk 50 feet with 2 turns activity did not occur: Safety/medical concerns         Walk 150 feet activity   Assist Walk 150 feet activity did not occur: Safety/medical concerns         Walk 10 feet on uneven surface  activity   Assist Walk 10 feet on uneven surfaces activity did not occur: Safety/medical concerns         Wheelchair     Assist Is the patient using a wheelchair?: Yes Type of Wheelchair: Manual    Wheelchair assist level: Dependent - Patient 0%      Wheelchair 50 feet with 2 turns activity    Assist        Assist Level: Dependent - Patient 0%   Wheelchair 150 feet activity     Assist      Assist Level: Dependent - Patient 0%   Blood pressure 124/63, pulse 81, temperature 98 F (36.7 C), temperature source Oral, resp. rate 16, height 5\' 7"  (1.702 m), weight 82.4 kg, SpO2 100 %.  Medical Problem List and Plan: 1. 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  Status post ACDF C4-5 10/02/2022 per Dr.Yarbrough.  Cervical collar when out of bed applied in sitting position             -patient may  shower- monitor skin             -ELOS/Goals: 3-4 weeks- min A hopefully  D/c 11/22  Con't CIR- PT and OT- doesn't want family conference -DVT/anticoagulation:  Pharmaceutical: Lovenox.  Venous Doppler studies 10/07/2022 negative             -antiplatelet therapy: N/A 3. Pain Management: Neurontin 300 mg twice daily, Robaxin and oxycodone as needed  11/3- pain 8-9/10 this Am, but was due for pain meds- con't regimen for now- will increase to 600 mg BID but no higher due to some elevation/Cr of 1.4-1.5  11/6- pain controlled- con't regimen  11/7- will  add voltaren gel 2G QID for hands/feet 4. Mood/Behavior/Sleep: Lexapro 25 mg daily             -  antipsychotic agents: N/A 5. Neuropsych/cognition: This patient is capable of making decisions on her own behalf. 6. Skin/Wound Care left facial cheek laceration: Status post sutures received in ED.  Routine skin checks 7. Fluids/Electrolytes/Nutrition:   -will adv to regular diet. She can make choices as to what she can chew or not 8.  Diabetes mellitus.  Hemoglobin A1c 5.9.  Currently maintained on Semglee 8 units daily.  Patient on Tresiba 8 units daily prior to admission  11/6- CBG's labile- 96-222-  9.  AKI on CKD stage III.  Baseline creatinine 1.18-1.26.  Follow-up chemistries  11/3-4- Cr up to 1.41- will recheck Monday and push fluids  11/8- Cr 1.35- but has been hanging around 1.2 to 1.4-  10.  Acute on chronic anemia.  Follow-up CBC 11.  Intrahepatic cholangiocarcinoma.  Patient on chemotherapy followed by Dr. Randa Evens.  Follow-up outpatient- port not accessed- missed last chemo treatment-her car broke down- will need to be here 3-4 weeks.   11/3- supposedly got chemo q2 weeks- will need to check with Oncology what to do while here. D/w PA  11/6- they will restart once leaves rehab 12.  Obesity.  BMI 27.62.  Dietary follow-up 13.  Hypertension.  Norvasc 2.5 mg daily.  Monitor with increased mobility 14.  History of tobacco use/COPD.  Continue inhalers as indicated 15.  Hyperlipidemia.  Lipitor 16. Complicated UTI.Empiric IV Rocephin 10/08/2022 changed to keflex for 7 days- pending culture, hopefully- will monitor  11/6- ON Keflex for Proteus- is sensitive-  17. Neurogenic bladder- will do bladder scans q6 hours and cath if volumes >350cc 18. Neurogenic bowel with constipation- will get cleaned out- Sorbitol 45cc now and follow with SSE if no results- might need bowel program- not clear yet.   11/3- cleaned out- was continent  11/7- last night, required a full disimpaction- has  hemorrhoids- will treat- will also change Senna to qAM and add Miralax daily.  19. Stage III coccyx pressure ulcer- on admission- will automatically get WOC consult and determine if needs air mattress, place foam for the moment and turn often- will order turning q2 hours  11/3- will make sure she's on ROHO- seen by WOC- started aquacel with foam dressing to change daily.  20. Shivering/cold- will order kpad to warm her up and to help back pain.   11/3- resolved 21. Leukocytosis-  11/6- no fever- WBC 19.6- up from 14k- will order CXR; also wondering if her cancer treatment, or missing a chemo dose might play a part?- I think it's low chance, but have spoken with PA to look into further w/u.   11/7- labs in AM- severe need for disimpaction might have played a role, but not cause of WBC.  11/8- WBC back down to her "baseline" of 14.2-  22. Hemorrhoids  11/7- adding Anusol supp after dinner at 6pm daily.  23. Hypocalcemia  11/8- will monitor    LOS: 6 days A FACE TO FACE EVALUATION WAS PERFORMED  Janice Short 10/14/2022, 6:44 PM

## 2022-10-14 NOTE — Progress Notes (Signed)
Occupational Therapy Session Note  Patient Details  Name: Janice Short MRN: [id number] Date of Birth: September 24, 1961  Today's Date: 10/14/2022 OT Individual Time: 1345-1445 OT Individual Time Calculation (min): 60 min    Short Term Goals: Week 1:  OT Short Term Goal 1 (Week 1): Patient will maintain sitting balance at EOB with no more than min A with dynamic task OT Short Term Goal 2 (Week 1): Patient will be able to reach under R arm to wash armpit OT Short Term Goal 3 (Week 1): Patient will complete toilet transfer with mod A +2  Skilled Therapeutic Interventions/Progress Updates:  Pt seen for pm skilled OT session. Pt was on commode over toilet upon OT arrival with NT assisting. Pt requested shower training. Transfer via modified squat pivot from commode with grab bar to w/c then onto tub transfer bench for shower access with max A for forward weight shifts then mod A and mod facilitation laterally. Pt requires significant momentum forward to power up to stand. Mod A for UB and max A LB bathing seated using lateral leans with mod cues for joint protection and falls prevention. Pt then transferred back to w/c and sat sink side for dressing and [place]en neck collar pads changed to new dry in supine position in TIS w/c, Required set up for hospital gown donning, min A to open lotion container d/t L>R hand weakness and dexterity deficits and max A for incontinence brief and mod-max A for slipper socks, OT will initiate sock aide use and LH sponge when back in stock. Left pt w/c level with chair exit alarm set, call button and needs in reach.    Therapy Documentation Precautions:  Precautions Precautions: Cervical, Fall Precaution Booklet Issued: No Precaution Comments: [place] Required Braces or Orthoses: Cervical Brace Cervical Brace: Hard collar Restrictions Weight Bearing Restrictions: No   Therapy/Group: Individual Therapy  Barnabas Lister 10/14/2022, 7:46 AM

## 2022-10-15 LAB — GLUCOSE, CAPILLARY
Glucose-Capillary: 129 mg/dL — ABNORMAL HIGH (ref 70–99)
Glucose-Capillary: 165 mg/dL — ABNORMAL HIGH (ref 70–99)
Glucose-Capillary: 193 mg/dL — ABNORMAL HIGH (ref 70–99)
Glucose-Capillary: 215 mg/dL — ABNORMAL HIGH (ref 70–99)

## 2022-10-15 MED ORDER — HYDROCORTISONE ACETATE 25 MG RE SUPP: 25.0000 mg | Freq: Every day | RECTAL | Status: DC | PRN

## 2022-10-15 MED ORDER — POLYETHYLENE GLYCOL 3350 17 G PO PACK
17.0000 g | PACK | Freq: Every day | ORAL | Status: DC | PRN
  Administered 2022-10-17: 17 g via ORAL
  Filled 2022-10-15: qty 1

## 2022-10-15 NOTE — Progress Notes (Signed)
Physical Therapy Session Note  Patient Details  Name: Janice Short MRN: [id number] Date of Birth: [birth year]  Today's Date: 10/15/2022 PT Individual Time: 1003-1100 PT Individual Time Calculation (min): 57 min   Short Term Goals: Week 1:  PT Short Term Goal 1 (Week 1): pt will perform supine to sit w/mod assist of 1 PT Short Term Goal 2 (Week 1): pt will perform bed to/from chair transfer w/LRAD w/mod assist of 1 PT Short Term Goal 3 (Week 1): Pt will request assit w/pressure relief consistently per schedule w/minimal cueing/reminders PT Short Term Goal 4 (Week 1): pt will perform sit to stand in LRAD mod assist of 2  Skilled Therapeutic Interventions/Progress Updates:      Therapy Documentation Precautions:  Precautions Precautions: Cervical, Fall Precaution Booklet Issued: No Precaution Comments: [place] Required Braces or Orthoses: Cervical Brace Cervical Brace: Hard collar Restrictions Weight Bearing Restrictions: No  Pt received seated in w/c at bedside agreeable to PT session with emphasis on gait training and LE strengthening. Pt reports unrated left shoulder pain and declined pain medication. PT provided mod A for donning shoes for time management. Pt transported by w/c for time management and ambulated 100 ft min A with RW with w/c follow. Pt demonstrates improved posture and able to obtain upright position throughout gait. Pt requires tactile and verbal cues for sequencing and walker management as pt has tendency to keep walker too close to her body. Pt required seated rest break and transitioned to resisted green theraband lateral side stepping with bilateral UE support on parallel bars 8 ft x 2. Pt transported to room and left seated in w/c at bedside with chair alarm on and all needs within reach.   Therapy/Group: Individual Therapy  Verl Dicker Verl Dicker PT, DPT  10/15/2022, 7:53 AM

## 2022-10-15 NOTE — Progress Notes (Signed)
Occupational Therapy Session Note  Patient Details  Name: Janice Short MRN: [id number] Date of Birth: 11-21-1961  Today's Date: 10/15/2022 OT Group Time: 1330-1430 OT Group Time Calculation (min): 60 min    Short Term Goals: Week 1:  OT Short Term Goal 1 (Week 1): Patient will maintain sitting balance at EOB with no more than min A with dynamic task OT Short Term Goal 2 (Week 1): Patient will be able to reach under R arm to wash armpit OT Short Term Goal 3 (Week 1): Patient will complete toilet transfer with mod A +2  Skilled Therapeutic Interventions/Progress Updates:    Pt participated in group session with a focus on stress mgmt, education provided on healthy coping strategies, and social interaction. Focus of session on providing coping strategies to manage new diagnosis to allow for improved mental health to increase overall quality of life . Discussed how to break down stressors into "daily hassles," "major life stressors" and "life circumstances" in an effort to allow pts to chunk their stressors into groups and determine where to best put their efforts/time when dealing with stress. Provided active listening, emotional support and therapeutic use of self. Offered education on factors that protect [place] against stress such as "daily uplifts," "healthy coping strategies" and "protective factors." Encouraged all group members to make an effort to actively recall one event from their day that was a daily uplift in an effort to protect their mindset from stressors as well as sharing this information with their caregivers to facilitate improved caregiver communication and decrease overall burden of care.  Issued pt handouts on healthy coping strategies to implement into routine.  Therapy Documentation Precautions:  Precautions Precautions: Cervical, Fall Precaution Booklet Issued: No Precaution Comments: [place] Required Braces or Orthoses: Cervical Brace Cervical Brace: Hard  collar Restrictions Weight Bearing Restrictions: No    Therapy/Group: Group Therapy  Tonny Branch 10/15/2022, 2:47 PM

## 2022-10-15 NOTE — Progress Notes (Signed)
Physical Therapy Session Note  Patient Details  Name: Janice Short MRN: [id number] Date of Birth: 18-Dec-1960  Today's Date: 10/15/2022 PT Individual Time: 7867-6720 PT Individual Time Calculation (min): 28 min   Short Term Goals: Week 1:  PT Short Term Goal 1 (Week 1): pt will perform supine to sit w/mod assist of 1 PT Short Term Goal 2 (Week 1): pt will perform bed to/from chair transfer w/LRAD w/mod assist of 1 PT Short Term Goal 3 (Week 1): Pt will request assit w/pressure relief consistently per schedule w/minimal cueing/reminders PT Short Term Goal 4 (Week 1): pt will perform sit to stand in LRAD mod assist of 2  Skilled Therapeutic Interventions/Progress Updates: Pt presented in TIS agreeable to therapy as completing lunch. Pt denies pain at rest. Pt transported to rehab gym for time management and energy conservation. Participated in standing therex including hip abd/add 3 x 10 bilaterally, hip extension 3 x 10 bilaterally, and modified squats (squatting to w/c seat then returning to stand) x 5. Pt was able to perform all Sit to stand in w/c with CGA in parallel bars and was able to perform brief static stand without AD demonstrating good posture. Pt transported to ortho gym at end of session to participate in group session with current needs met.      Therapy Documentation Precautions:  Precautions Precautions: Cervical, Fall Precaution Booklet Issued: No Precaution Comments: [place] Required Braces or Orthoses: Cervical Brace Cervical Brace: Hard collar Restrictions Weight Bearing Restrictions: No General:   Vital Signs: Therapy Vitals Temp: 98.4 F (36.9 C) Temp Source: Oral Pulse Rate: 73 Resp: 16 BP: 133/60 Patient Position (if appropriate): Sitting Oxygen Therapy SpO2: 100 % O2 Device: Room Air Pain:   Mobility:   Locomotion :    Trunk/Postural Assessment :    Balance:   Exercises:   Other Treatments:      Therapy/Group: Individual Therapy  Clinton Wahlberg 10/15/2022, 3:57 PM

## 2022-10-15 NOTE — Progress Notes (Signed)
Occupational Therapy Session Note  Patient Details  Name: Janice Short MRN: [id number] Date of Birth: [birth year]  Today's Date: 10/15/2022 OT Individual Time: 6606-0045 OT Individual Time Calculation (min): 45 min    Short Term Goals: Week 1:  OT Short Term Goal 1 (Week 1): Patient will maintain sitting balance at EOB with no more than min A with dynamic task OT Short Term Goal 2 (Week 1): Patient will be able to reach under R arm to wash armpit OT Short Term Goal 3 (Week 1): Patient will complete toilet transfer with mod A +2  Skilled Therapeutic Interventions/Progress Updates:   Pt seen for am OT visit. Pt requested toilet use and moved from upright in bed to EOB with CGA then bed to DABSC laterally with increased time and forward weight shift with CGA. Voided and had BM (see Flowsheets for output data). Mod A buttocks and peri hygiene. Same lateral transfer to TIS w/c with CGA. Sink side grooming with occ min A for small containers d/t L UE weakness and dexterity deficits. UB pullover shirt with set up, incontinence brief and pants management with intermittent sink side support standing with max A. Left pt up in TIS with chair exit alarm set, nurse call button and needs within reach. Pain 5/10 and meds administered earlier.   Therapy Documentation Precautions:  Precautions Precautions: Cervical, Fall Precaution Booklet Issued: No Precaution Comments: [place] Required Braces or Orthoses: Cervical Brace Cervical Brace: Hard collar Restrictions Weight Bearing Restrictions: No    Therapy/Group: Individual Therapy  Barnabas Lister 10/15/2022, 9:18 AM

## 2022-10-15 NOTE — Progress Notes (Signed)
PROGRESS NOTE   Subjective/Complaints:  Already got pain meds- feeling good this AM Excited about d/c 11/22  Doesn't need Anusol anymore, per pt.   Very upset- had loose bowel accident yesterday- large amount- very upset about this. And didn't know had a BM- thought passed gas.   D/w nursing- will hold miralax and since PVRs low, will d/c.    ROS:   Pt denies SOB, abd pain, CP, N/V/C/D, and vision changes    Objective:   No results found. Recent Labs    10/14/22 0544  WBC 14.2*  HGB 8.9*  HCT 27.3*  PLT 204   Recent Labs    10/14/22 0544  NA 139  K 4.0  CL 111  CO2 24  GLUCOSE 77  BUN 12  CREATININE 1.35*  CALCIUM 7.7*    Intake/Output Summary (Last 24 hours) at 10/15/2022 0915 Last data filed at 10/15/2022 0800 Gross per 24 hour  Intake 1393 ml  Output 500 ml  Net 893 ml     Pressure Injury 10/06/22 Buttocks Right Stage 3 -  Full thickness tissue loss. Subcutaneous fat may be visible but bone, tendon or muscle are NOT exposed. see picture in Epic (Active)  10/06/22 1130  Location: Buttocks  Location Orientation: Right  Staging: Stage 3 -  Full thickness tissue loss. Subcutaneous fat may be visible but bone, tendon or muscle are NOT exposed.  Wound Description (Comments): see picture in Epic  Present on Admission: Yes    Physical Exam: Vital Signs Blood pressure 119/65, pulse 75, temperature 98 F (36.7 C), temperature source Oral, resp. rate 16, height 5\' 7"  (1.702 m), weight 82.4 kg, SpO2 99 %.       General: awake, alert, appropriate, nurse in room; NAD HENT: conjugate gaze; oropharynx moist CV: regular rate; no JVD Pulmonary: CTA B/L; no W/R/R- good air movement GI: soft, NT, ND, (+)BS Psychiatric: appropriate- bright until discussed bowels, then tearful Neurological: Ox3 Musculoskeletal:     Comments: RUE- Bicep 4+/5; triceps 4/5; WE 4-/5; grip 4-/5; FA 4-/5 LUE- Biceps 4-/5;  WE 3-/5; Triceps 2/5; Grip 2-/5 and FA 1/5- no real change today on exam RLE- HF, KE 4/5; DF/PF and EHL 4+/5 LLE- DF/PF 4/5 and EHL 4-/5- pt unable to let me uncover her again, she's so cold to test rest of LLE  Skin:    General: Skin is warm and dry.     Comments: Skin laceration to left facial cheek with stitches in place.  ACDF site clean and dry Stage III- superficial stage III 1.3 x 1.0 x 0.5 cm on coccyx with a few tiny spots that are more superficial around it   Neurological:     Mental Status: She is alert.     Comments: Patient is alert and oriented x3.  Follows full commands. Ox3 No abnl tone or clonus RLE/LLE Assessment/Plan: 1. Functional deficits which require 3+ hours per day of interdisciplinary therapy in a comprehensive inpatient rehab setting. Physiatrist is providing close team supervision and 24 hour management of active medical problems listed below. Physiatrist and rehab team continue to assess barriers to discharge/monitor patient progress toward functional and medical goals  Care Tool:  Bathing    Body parts bathed by patient: Chest, Front perineal area, Abdomen, Face, Left arm, Right upper leg, Left upper leg   Body parts bathed by helper: Right arm, Right lower leg, Left lower leg, Buttocks     Bathing assist Assist Level: Moderate Assistance - Patient 50 - 74%     Upper Body Dressing/Undressing Upper body dressing   What is the patient wearing?: Pull over shirt    Upper body assist Assist Level: Moderate Assistance - Patient 50 - 74%    Lower Body Dressing/Undressing Lower body dressing      What is the patient wearing?: Underwear/pull up, Incontinence brief     Lower body assist Assist for lower body dressing: Total Assistance - Patient < 25%     Toileting Toileting    Toileting assist Assist for toileting: 2 Helpers     Transfers Chair/bed transfer  Transfers assist     Chair/bed transfer assist level: 2 Helpers      Locomotion Ambulation   Ambulation assist   Ambulation activity did not occur: Safety/medical concerns          Walk 10 feet activity   Assist  Walk 10 feet activity did not occur: Safety/medical concerns        Walk 50 feet activity   Assist Walk 50 feet with 2 turns activity did not occur: Safety/medical concerns         Walk 150 feet activity   Assist Walk 150 feet activity did not occur: Safety/medical concerns         Walk 10 feet on uneven surface  activity   Assist Walk 10 feet on uneven surfaces activity did not occur: Safety/medical concerns         Wheelchair     Assist Is the patient using a wheelchair?: Yes Type of Wheelchair: Manual    Wheelchair assist level: Dependent - Patient 0%      Wheelchair 50 feet with 2 turns activity    Assist        Assist Level: Dependent - Patient 0%   Wheelchair 150 feet activity     Assist      Assist Level: Dependent - Patient 0%   Blood pressure 119/65, pulse 75, temperature 98 F (36.7 C), temperature source Oral, resp. rate 16, height 5\' 7"  (1.702 m), weight 82.4 kg, SpO2 99 %.  Medical Problem List and Plan: 1. 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  Status post ACDF C4-5 10/02/2022 per Dr.Yarbrough.  Cervical collar when out of bed applied in sitting position             -patient may  shower- monitor skin             -ELOS/Goals: 3-4 weeks- min A hopefully  D/c 11/22  Con't CIR- PT and OT- doesn't want family conference -DVT/anticoagulation:  Pharmaceutical: Lovenox.  Venous Doppler studies 10/07/2022 negative             -antiplatelet therapy: N/A 3. Pain Management: Neurontin 300 mg twice daily, Robaxin and oxycodone as needed  11/3- pain 8-9/10 this Am, but was due for pain meds- con't regimen  for now- will increase to 600 mg BID but no higher due to some elevation/Cr of 1.4-1.5  11/6- pain controlled- con't regimen  11/7- will add voltaren gel 2G QID for hands/feet  11/9-  pain controlled with meds- con't regimen 4. Mood/Behavior/Sleep: Lexapro 25 mg daily             -antipsychotic agents: N/A 5. Neuropsych/cognition: This patient is capable of making decisions on her own behalf. 6. Skin/Wound Care left facial cheek laceration: Status post sutures received in ED.  Routine skin checks 7. Fluids/Electrolytes/Nutrition:   -will adv to regular diet. She can make choices as to what she can chew or not 8.  Diabetes mellitus.  Hemoglobin A1c 5.9.  Currently maintained on Semglee 8 units daily.  Patient on Tresiba 8 units daily prior to admission  11/6- CBG's labile- 96-222-  9.  AKI on CKD stage III.  Baseline creatinine 1.18-1.26.  Follow-up chemistries  11/3-4- Cr up to 1.41- will recheck Monday and push fluids  11/8- Cr 1.35- but has been hanging around 1.2 to 1.4-  10.  Acute on chronic anemia.  Follow-up CBC 11.  Intrahepatic cholangiocarcinoma.  Patient on chemotherapy followed by Dr. Randa Evens.  Follow-up outpatient- port not accessed- missed last chemo treatment-her car broke down- will need to be here 3-4 weeks.   11/3- supposedly got chemo q2 weeks- will need to check with Oncology what to do while here. D/w PA  11/6- they will restart once leaves rehab 12.  Obesity.  BMI 27.62.  Dietary follow-up 13.  Hypertension.  Norvasc 2.5 mg daily.  Monitor with increased mobility 14.  History of tobacco use/COPD.  Continue inhalers as indicated 15.  Hyperlipidemia.  Lipitor 16. Complicated UTI.Empiric IV Rocephin 10/08/2022 changed to keflex for 7 days- pending culture, hopefully- will monitor  11/6- ON Keflex for Proteus- is sensitive-  17. Neurogenic bladder- will do bladder scans q6 hours and cath if volumes >350cc 18. Neurogenic bowel with constipation- will get cleaned out-  Sorbitol 45cc now and follow with SSE if no results- might need bowel program- not clear yet.   11/3- cleaned out- was continent  11/7- last night, required a full disimpaction- has hemorrhoids- will treat- will also change Senna to qAM and add Miralax daily.   11/9- will hold miralax/make prn- since had bowel accidents- due to loose stools- con't senna so doesn't get backed up again. Since on pain meds 19. Stage III coccyx pressure ulcer- on admission- will automatically get WOC consult and determine if needs air mattress, place foam for the moment and turn often- will order turning q2 hours  11/3- will make sure she's on ROHO- seen by WOC- started aquacel with foam dressing to change daily.  20. Shivering/cold- will order kpad to warm her up and to help back pain.   11/3- resolved 21. Leukocytosis-  11/6- no fever- WBC 19.6- up from 14k- will order CXR; also wondering if her cancer treatment, or missing a chemo dose might play a part?- I think it's low chance, but have spoken with PA to look into further w/u.   11/7- labs in AM- severe need for disimpaction might have played a role, but not cause of WBC.  11/8- WBC back down to her "baseline" of 14.2-  22. Hemorrhoids  11/7- adding Anusol supp after dinner at 6pm daily.  23. Hypocalcemia  11/8- will monitor  I spent a total of 35   minutes on total care today- >50% coordination of care- due to prolonged d/w nursing about pain, bowel incontinence, PVRs and pushing fluids.    LOS: 7 days A FACE TO FACE EVALUATION WAS PERFORMED  Janissa Bertram 10/15/2022, 9:15 AM

## 2022-10-15 NOTE — Group Note (Signed)
Patient Details Name: KENDAL GHAZARIAN MRN: [id number] DOB: [birth year] Today's Date: 10/15/2022    Group Description: Stress management: Pt participated in group session with a focus on stress mgmt, education provided on healthy coping strategies, and social interaction. Focus of session on providing coping strategies to manage new diagnosis to allow for improved mental health to increase overall quality of life . Discussed how to break down stressors into "daily hassles," "major life stressors" and "life circumstances" in an effort to allow pts to chunk their stressors into groups and determine where to best put their efforts/time when dealing with stress. Provided active listening, emotional support and therapeutic use of self. Offered education on factors that protect [place] against stress such as "daily uplifts," "healthy coping strategies" and "protective factors." Encouraged all group members to make an effort to actively recall one event from their day that was a daily uplift in an effort to protect their mindset from stressors as well as sharing this information with their caregivers to facilitate improved caregiver communication and decrease overall burden of care.  Issued pt handouts on healthy coping strategies to implement into routine.   Individual level documentation: Patient participated with full collaboration during session. Pt easily engaged in discussion with group members sharing personal stressors and discussing relaxation techniques.  Pain:  No c/o    Arliss Hepburn 10/15/2022, 3:31 PM

## 2022-10-16 ENCOUNTER — Ambulatory Visit: Payer: Medicare Other | Admitting: Oncology

## 2022-10-16 ENCOUNTER — Other Ambulatory Visit: Payer: Self-pay

## 2022-10-16 ENCOUNTER — Other Ambulatory Visit: Payer: Self-pay | Admitting: Family Medicine

## 2022-10-16 ENCOUNTER — Ambulatory Visit: Payer: Medicare Other

## 2022-10-16 ENCOUNTER — Other Ambulatory Visit: Payer: Medicare Other

## 2022-10-16 ENCOUNTER — Encounter: Payer: Self-pay | Admitting: Orthopedic Surgery

## 2022-10-16 DIAGNOSIS — J4489 Other specified chronic obstructive pulmonary disease: Secondary | ICD-10-CM

## 2022-10-16 LAB — GLUCOSE, CAPILLARY
Glucose-Capillary: 78 mg/dL (ref 70–99)
Glucose-Capillary: 158 mg/dL — ABNORMAL HIGH (ref 70–99)
Glucose-Capillary: 155 mg/dL — ABNORMAL HIGH (ref 70–99)
Glucose-Capillary: 190 mg/dL — ABNORMAL HIGH (ref 70–99)

## 2022-10-16 NOTE — Progress Notes (Signed)
Occupational Therapy Session Note  Patient Details  Name: Janice Short MRN: [id number] Date of Birth: [birth year]  Today's Date: 10/16/2022 OT Individual Time: 4132-4401 OT Individual Time Calculation (min): 60 min    Short Term Goals: Week 2:  OT Short Term Goal 1 (Week 2): Pt will complete all grooming sink side w/c level with set up only including [place] OT Short Term Goal 2 (Week 2): Pt will complete toilet and shower bench transfer with modified squat pivot transfer x 1 person with mod A OT Short Term Goal 3 (Week 2): Pt will stand with counter/RW support for 2 minutes for self care OT Short Term Goal 4 (Week 2): Pt will complete UB dressing with set up and LB dressing with min A with AE  Skilled Therapeutic Interventions/Progress Updates:    Upon OT arrival, pt arriving from PT session. Pt agreeable to OT treatment and reports no pain. Pt completes sit to stand transfer from TIS with Max A on 3rd attempt using RW and ambulates with CGA to manual w/c. Pt reports chair feels comfortable and PTA notified. Pt donns gloves with Mod A and attempts to propel herself to dayroom using B UE. Pt with Max difficulty and was only able to turn herself around with majority of task being completed with the R UE. Good efforts noted. Pt was transported to dayroom via w/c and total A and sits at tabletop. One and a half pound bilateral wrist weights donned to B UE and pt places graded clothespins onto graded horizontal rods. Pt with max difficulty to complete task with the L UE secondary to decreased grip strength and manipulation and requires assist from the R UE to complete. Pt's L wrist weight doffed to grade task down. Pt able to place half of clothespins onto rods before end of therapy session. Pt was transported back to her room via w/c and total A and left in w/c with all needs met.   Therapy Documentation Precautions:  Precautions Precautions: Cervical, Fall Precaution Booklet Issued: No Precaution  Comments: [place] Required Braces or Orthoses: Cervical Brace Cervical Brace: Hard collar Restrictions Weight Bearing Restrictions: No  Therapy/Group: Individual Therapy  Marvetta Gibbons 10/16/2022, 12:22 PM

## 2022-10-16 NOTE — Progress Notes (Signed)
Occupational Therapy Weekly Progress Note  Patient Details  Name: Janice Short MRN: [id number] Date of Birth: 1961/05/07  Beginning of progress report period: October 09, 2022 End of progress report period: October 16, 2022  Today's Date: 10/16/2022 OT Individual Time: 0800-0900 OT Individual Time Calculation (min): 60 min    Patient has met 4 of 4 short term goals.  Pt has made tremendous gains since IE going from 2 person lift transfers to lateral modified squat pivot transfers and weight shift forward supported sit to stand with RW for LB self care. Pt has demonstrated increased functional UE use with decreased overall pain. Pt will be moved from TIS w/c to regular w/c to increase independence with self propulsion training. Pt is benefiting from CIR with new updated STG's to reflect progress agreed upon in collaboration with pt this visit.  Patient continues to demonstrate the following deficits: muscle weakness and muscle joint tightness, impaired timing and sequencing, unbalanced muscle activation, and decreased coordination, and decreased sitting balance, decreased standing balance, decreased postural control, and decreased balance strategies and therefore will continue to benefit from skilled OT intervention to enhance overall performance with BADL, iADL, and Reduce care partner burden.  Patient progressing toward long term goals..  Continue plan of care.  OT Short Term Goals Week 1:  OT Short Term Goal 1 (Week 1): Patient will maintain sitting balance at EOB with no more than min A with dynamic task OT Short Term Goal 1 - Progress (Week 1): Met OT Short Term Goal 2 (Week 1): Patient will be able to reach under R arm to wash armpit OT Short Term Goal 2 - Progress (Week 1): Met OT Short Term Goal 3 (Week 1): Patient will complete toilet transfer with mod A +2 OT Short Term Goal 3 - Progress (Week 1): Met Week 2:  OT Short Term Goal 1 (Week 2): Pt will complete all grooming sink side  w/c level with set up only including [place] OT Short Term Goal 2 (Week 2): Pt will complete toilet and shower bench transfer with modified squat pivot transfer x 1 person with mod A OT Short Term Goal 3 (Week 2): Pt will stand with counter/RW support for 2 minutes for self care OT Short Term Goal 4 (Week 2): Pt will complete UB dressing with set up and LB dressing with min A with AE  Skilled Therapeutic Interventions/Progress Updates:   Pt receiving am meds upon OT arrival already up in w/c had been toileted and LB bathed with dry incontinence brief in place. OT training with full grooming, UB/LB dressing with standing focus with RW and forward weight shifts as well as L hand function. See above for upgraded STG's. Pt continues to require increased time and effort for all [facility] especially due to L>R hand weakness. Requires max A at times for powering up to standing for LB self care and assistance to pull up garments. OT continues to address LB self care modified techniques with AE and this session with reacher and sock aide. Care cood with OT for later session to trial pt in regular manual w/c as currently needs all support for TIS management. Left pt up in w/c with chair exit alarm set, needs and nurse call button in reach.   Therapy Documentation Precautions:  Precautions Precautions: Cervical, Fall Precaution Booklet Issued: No Precaution Comments: [place] Required Braces or Orthoses: Cervical Brace Cervical Brace: Hard collar Restrictions Weight Bearing Restrictions: No   Therapy/Group: Individual Therapy  Barnabas Lister  10/16/2022, 7:45 AM

## 2022-10-16 NOTE — Progress Notes (Signed)
PROGRESS NOTE   Subjective/Complaints:  Pt said having BM on toilet- no more accidents so far.   Walked 100 ft with RW with therapy- really excited about progress.  No issues this AM.    ROS:   Pt denies SOB, abd pain, CP, N/V/C/D, and vision changes    Objective:   No results found. Recent Labs    10/14/22 0544  WBC 14.2*  HGB 8.9*  HCT 27.3*  PLT 204   Recent Labs    10/14/22 0544  NA 139  K 4.0  CL 111  CO2 24  GLUCOSE 77  BUN 12  CREATININE 1.35*  CALCIUM 7.7*    Intake/Output Summary (Last 24 hours) at 10/16/2022 0856 Last data filed at 10/16/2022 0735 Gross per 24 hour  Intake 593 ml  Output 650 ml  Net -57 ml     Pressure Injury 10/06/22 Buttocks Right Stage 3 -  Full thickness tissue loss. Subcutaneous fat may be visible but bone, tendon or muscle are NOT exposed. see picture in Epic (Active)  10/06/22 1130  Location: Buttocks  Location Orientation: Right  Staging: Stage 3 -  Full thickness tissue loss. Subcutaneous fat may be visible but bone, tendon or muscle are NOT exposed.  Wound Description (Comments): see picture in Epic  Present on Admission: Yes    Physical Exam: Vital Signs Blood pressure 115/69, pulse 76, temperature 98 F (36.7 C), temperature source Oral, resp. rate 18, height 5\' 7"  (1.702 m), weight 82.4 kg, SpO2 100 %.        General: awake, alert, appropriate, sitting on toilet having BM; NT in room; NAD HENT: conjugate gaze; oropharynx moist CV: regular rate; no JVD Pulmonary: CTA B/L; no W/R/R- good air movement GI: soft, NT, ND, (+)BS Psychiatric: appropriate- brighter affect Neurological: Ox3 Musculoskeletal:     Comments: RUE- Bicep 4+/5; triceps 4/5; WE 4-/5; grip 4-/5; FA 4-/5 LUE- Biceps 4-/5; WE 3-/5; Triceps 2/5; Grip 2-/5 and FA 1/5- no real change today on exam RLE- HF, KE 4/5; DF/PF and EHL 4+/5 LLE- DF/PF 4/5 and EHL 4-/5- pt unable to let me  uncover her again, she's so cold to test rest of LLE  Skin:    General: Skin is warm and dry.     Comments: Skin laceration to left facial cheek with stitches in place.  ACDF site clean and dry Stage III- superficial stage III 1.3 x 1.0 x 0.5 cm on coccyx with a few tiny spots that are more superficial around it   Neurological:     Mental Status: She is alert.     Comments: Patient is alert and oriented x3.  Follows full commands. Ox3 No abnl tone or clonus RLE/LLE Assessment/Plan: 1. Functional deficits which require 3+ hours per day of interdisciplinary therapy in a comprehensive inpatient rehab setting. Physiatrist is providing close team supervision and 24 hour management of active medical problems listed below. Physiatrist and rehab team continue to assess barriers to discharge/monitor patient progress toward functional and medical goals  Care Tool:  Bathing    Body parts bathed by patient: Chest, Front perineal area, Abdomen, Face, Left arm, Right upper leg, Left upper leg  Body parts bathed by helper: Right arm, Right lower leg, Left lower leg, Buttocks     Bathing assist Assist Level: Moderate Assistance - Patient 50 - 74%     Upper Body Dressing/Undressing Upper body dressing   What is the patient wearing?: Pull over shirt    Upper body assist Assist Level: Moderate Assistance - Patient 50 - 74%    Lower Body Dressing/Undressing Lower body dressing      What is the patient wearing?: Underwear/pull up, Incontinence brief     Lower body assist Assist for lower body dressing: Total Assistance - Patient < 25%     Toileting Toileting    Toileting assist Assist for toileting: 2 Helpers     Transfers Chair/bed transfer  Transfers assist     Chair/bed transfer assist level: 2 Helpers     Locomotion Ambulation   Ambulation assist   Ambulation activity did not occur: Safety/medical concerns          Walk 10 feet activity   Assist  Walk 10  feet activity did not occur: Safety/medical concerns        Walk 50 feet activity   Assist Walk 50 feet with 2 turns activity did not occur: Safety/medical concerns         Walk 150 feet activity   Assist Walk 150 feet activity did not occur: Safety/medical concerns         Walk 10 feet on uneven surface  activity   Assist Walk 10 feet on uneven surfaces activity did not occur: Safety/medical concerns         Wheelchair     Assist Is the patient using a wheelchair?: Yes Type of Wheelchair: Manual    Wheelchair assist level: Dependent - Patient 0%      Wheelchair 50 feet with 2 turns activity    Assist        Assist Level: Dependent - Patient 0%   Wheelchair 150 feet activity     Assist      Assist Level: Dependent - Patient 0%   Blood pressure 115/69, pulse 76, temperature 98 F (36.7 C), temperature source Oral, resp. rate 18, height 5\' 7"  (1.702 m), weight 82.4 kg, SpO2 100 %.  Medical Problem List and Plan: 1. 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  Status post ACDF C4-5 10/02/2022 per Dr.Yarbrough.  Cervical collar when out of bed applied in sitting position             -patient may  shower- monitor skin             -ELOS/Goals: 3-4 weeks- min A hopefully  D/c 11/22  Con't CIR- PT, and OT -DVT/anticoagulation:  Pharmaceutical: Lovenox.   Venous Doppler studies 10/07/2022 negative  11/10- saw getting lovenox BID - will make 40 mg daily.              -antiplatelet therapy: N/A 3. Pain Management: Neurontin 300 mg twice daily, Robaxin and oxycodone as needed  11/3- pain 8-9/10 this Am, but was due for pain meds- con't regimen for now- will increase to 600 mg BID but no higher due to some elevation/Cr of 1.4-1.5  11/6- pain controlled- con't regimen  11/7- will  add voltaren gel 2G QID for hands/feet  11/9- pain controlled with meds- con't regimen 4. Mood/Behavior/Sleep: Lexapro 25 mg daily             -  antipsychotic agents: N/A 5. Neuropsych/cognition: This patient is capable of making decisions on her own behalf. 6. Skin/Wound Care left facial cheek laceration: Status post sutures received in ED.  Routine skin checks 7. Fluids/Electrolytes/Nutrition:   -will adv to regular diet. She can make choices as to what she can chew or not 8.  Diabetes mellitus.  Hemoglobin A1c 5.9.  Currently maintained on Semglee 8 units daily.  Patient on Tresiba 8 units daily prior to admission  11/6- CBG's labile- 96-222-  11/10- CBG's 78- 193= with 1 value of 215- but otherwise pretty well controlled- will monitor 9.  AKI on CKD stage III.  Baseline creatinine 1.18-1.26.  Follow-up chemistries  11/3-4- Cr up to 1.41- will recheck Monday and push fluids  11/8- Cr 1.35- but has been hanging around 1.2 to 1.4-  10.  Acute on chronic anemia.  Follow-up CBC 11.  Intrahepatic cholangiocarcinoma.  Patient on chemotherapy followed by Dr. Randa Evens.  Follow-up outpatient- port not accessed- missed last chemo treatment-her car broke down- will need to be here 3-4 weeks.   11/3- supposedly got chemo q2 weeks- will need to check with Oncology what to do while here. D/w PA  11/6- they will restart once leaves rehab 12.  Obesity.  BMI 27.62.  Dietary follow-up 13.  Hypertension.  Norvasc 2.5 mg daily.  Monitor with increased mobility 14.  History of tobacco use/COPD.  Continue inhalers as indicated 15.  Hyperlipidemia.  Lipitor 16. Complicated UTI.Empiric IV Rocephin 10/08/2022 changed to keflex for 7 days- pending culture, hopefully- will monitor  11/6- ON Keflex for Proteus- is sensitive-  17. Neurogenic bladder- will do bladder scans q6 hours and cath if volumes >350cc 18. Neurogenic bowel with constipation- will get cleaned out- Sorbitol 45cc now and follow with SSE if no  results- might need bowel program- not clear yet.   11/3- cleaned out- was continent  11/7- last night, required a full disimpaction- has hemorrhoids- will treat- will also change Senna to qAM and add Miralax daily.   11/9- will hold miralax/make prn- since had bowel accidents- due to loose stools- con't senna so doesn't get backed up again. Since on pain meds  11/10- having BM on toilet- feeling better about lack of accidents today 19. Stage III coccyx pressure ulcer- on admission- will automatically get WOC consult and determine if needs air mattress, place foam for the moment and turn often- will order turning q2 hours  11/3- will make sure she's on ROHO- seen by WOC- started aquacel with foam dressing to change daily.  20. Shivering/cold- will order kpad to warm her up and to help back pain.   11/3- resolved 21. Leukocytosis-  11/6- no fever- WBC 19.6- up from 14k- will order CXR; also wondering if her cancer treatment, or missing a chemo dose might play a part?- I think it's low chance, but have spoken with PA to look into further w/u.   11/7- labs in AM- severe need for disimpaction might have played a role, but not cause of WBC.  11/8- WBC back down to her "baseline" of 14.2-  22. Hemorrhoids  11/7- adding Anusol supp after dinner at 6pm daily.  23. Hypocalcemia  11/8- will monitor   I spent a total of 35   minutes on total care today- >50% coordination of care- due to talking with nursing about Lovenox and fixing issue and reviewing hx of CBG's lately. .    LOS: 8 days A FACE TO FACE EVALUATION WAS PERFORMED  Cassey Bacigalupo 10/16/2022, 8:56 AM

## 2022-10-16 NOTE — Telephone Encounter (Signed)
Rx was sent to same pharmacy on 10/08/22.   Requested Prescriptions  Refused Prescriptions Disp Refills   albuterol (VENTOLIN HFA) 108 (90 Base) MCG/ACT inhaler [Pharmacy Med Name: albuterol sulfate HFA 90 mcg/actuation aerosol inhaler] 8.5 g 11    Sig: INHALE TWO PUFFS BY MOUTH EVERY 6 HOURS AS NEEDED FOR WHEEZING OR FOR SHORTNESS OF BREATH (BULK)     Pulmonology:  Beta Agonists 2 Passed - 10/16/2022  5:12 PM      Passed - Last BP in normal range    BP Readings from Last 1 Encounters:  10/16/22 (!) 124/58         Passed - Last Heart Rate in normal range    Pulse Readings from Last 1 Encounters:  10/16/22 76         Passed - Valid encounter within last 12 months    Recent Outpatient Visits           3 weeks ago Diabetic polyneuropathy associated with type 2 diabetes mellitus [facility])   [facility] [place], Drue Stager, MD   9 months ago Diabetes mellitus type 2 with complications [facility])   [facility] Steele Sizer, MD   11 months ago Chronic left shoulder pain   [facility] Teodora Medici, DO   1 year ago Diabetes mellitus type 2 with complications [facility])   [facility] Steele Sizer, MD   1 year ago Diabetes mellitus type 2 with complications [facility])   [facility] Steele Sizer, MD       Future Appointments             In 3 months Ancil Boozer, Drue Stager, MD [facility]   In 3 months  [place]

## 2022-10-16 NOTE — Progress Notes (Signed)
Physical Therapy Session Note  Patient Details  Name: Janice Short MRN: [id number] Date of Birth: [birth year]  Today's Date: 10/16/2022 PT Individual Time: 1025-1105 and 2774-1287 PT Individual Time Calculation (min): 40 min and 42 min  Short Term Goals: Week 1:  PT Short Term Goal 1 (Week 1): pt will perform supine to sit w/mod assist of 1 PT Short Term Goal 2 (Week 1): pt will perform bed to/from chair transfer w/LRAD w/mod assist of 1 PT Short Term Goal 3 (Week 1): Pt will request assit w/pressure relief consistently per schedule w/minimal cueing/reminders PT Short Term Goal 4 (Week 1): pt will perform sit to stand in LRAD mod assist of 2  Skilled Therapeutic Interventions/Progress Updates: Pt presented in TIS with Lattie Haw, RT present agreeable to therapy. Pt denies pain during rest, intermittent c/o LUE pain during standing and ambulation. Pt transported to day room and partcipated in Sit to stand from TIS x 3 with modA and noted significant forward flexion of trunk. On last stand pt ambulated around nsg station 196f with RW, w/c follow and minA. Pt then participated in toe taps to 4in step x 12 with cues for erect posture. Pt able to progress Sit to [facility] able to ambulate an additional ~872fto nsg station with cues for erect posture and increasing BOS. Pt tranpsorted remaining distance back to room and [place] with current needs met.   Tx2: Pt presented in standard w/c agreeable to therapy. Pt states some frustration due to not being able to propel w/c with B hands. PTA transported pt to rehab gym and applied theraband to L rim. Pt attempted to propel however noted that L hand continued to slip and that RUE was overpowering L. PTA advised that will advise primary OT/PT of current disposition regarding w/c. Pt then worked on w/c mobility BLE strengthening by propelling w/c forward/backward [address] for hamstring and quad activation. Pt was  able to perform this with supervision and rest breaks. Pt then transported back to room and performed ambulatory transfer to recliner with RW and modA for stand and CGA for ambulation. Pt left in recliner at end of session with seat alarm on, call bell within reach and needs met.      Therapy Documentation Precautions:  Precautions Precautions: Cervical, Fall Precaution Booklet Issued: No Precaution Comments: [place] Braces or Orthoses: Cervical Brace Cervical Brace: Hard collar Restrictions Weight Bearing Restrictions: No General:   Vital Signs: Therapy Vitals Temp: 98.5 F (36.9 C) Temp Source: Oral Pulse Rate: 76 Resp: 18 BP: (!) 124/58 Patient Position (if appropriate): Sitting Oxygen Therapy SpO2: 100 % O2 Device: Room Air Pain: Pain Assessment Pain Scale: 0-10 Pain Score: 7  Pain Location: Generalized Pain Orientation: Left Pain Descriptors / Indicators: Aching;Tingling Pain Frequency: Intermittent Pain Onset: Gradual Patients Stated Pain Goal: 0 Pain Intervention(s): Medication (See eMAR) Mobility:   Locomotion :    Trunk/Postural Assessment :    Balance:   Exercises:   Other Treatments:      Therapy/Group: Individual Therapy  Jadien Lehigh 10/16/2022, 4:07 PM

## 2022-10-17 LAB — GLUCOSE, CAPILLARY
Glucose-Capillary: 136 mg/dL — ABNORMAL HIGH (ref 70–99)
Glucose-Capillary: 205 mg/dL — ABNORMAL HIGH (ref 70–99)
Glucose-Capillary: 225 mg/dL — ABNORMAL HIGH (ref 70–99)
Glucose-Capillary: 207 mg/dL — ABNORMAL HIGH (ref 70–99)

## 2022-10-17 NOTE — Progress Notes (Signed)
Occupational Therapy Session Note  Patient Details  Name: Janice Short MRN: [id number] Date of Birth: 12/20/1960  Today's Date: 10/17/2022 OT Individual Time:9:30-10:45 and  1275-1700 OT Individual Time Calculation (min): 25 min and 75= total 100 minutes   Short Term Goals: Week 1:  OT Short Term Goal 1 (Week 1): Patient will maintain sitting balance at EOB with no more than min A with dynamic task OT Short Term Goal 1 - Progress (Week 1): Met OT Short Term Goal 2 (Week 1): Patient will be able to reach under R arm to wash armpit OT Short Term Goal 2 - Progress (Week 1): Met OT Short Term Goal 3 (Week 1): Patient will complete toilet transfer with mod A +2 OT Short Term Goal 3 - Progress (Week 1): Met  Skilled Therapeutic Interventions/Progress Updates:  1st session:930-10:45 Patient participated in session as follows: Presented seated upright edge of bed. Bed/wheelchair via side scoot transfer= CGA;    grooming seated in w/c at sink with extra time and used left UE as stabilizer and Min A to complete some of the very small fine motor components required, such as untwisting small lid on body wash.        UB bathing and dressing= extra time and initiated active use of L UE to wash right side of body although AROM was diminished.     LB bathing (stated she had recently previously completed change of brief and pericare with nursing) and LB dressing = Min A seated in w/c : crossed each leg (figure 4) to wash feet, don lotion, and don and doff tredded socks.  W/c to drop arm 3:1 over toilet transfer = Min Assist for several side scoots uphill to her right.    She asked for much time to complete elimination while this clinician supervised, and then asked to stay on toilet for extended time.  RN seated outside door voiced that she would listen out for bathroom call bell when patient was finished with toileting.  (Patient demonstrated understanding for pulling bathroom call bell string as she stated  she had done so for prior toileting episodes.    2nd session  : 1055-11:20  In w/c at sink, patient completed grooming and oral care with extra time as she stated she wanted to be thorough in the completion of the tasks involved.  She demonstrated use of left hand for small tasks with extra time to Min hand over hand assist for bilateral skills such as untwisting toothpaste cap and other dexterity and small fine motor requirements.  Additionally this session she complete bilateral UE AROM and fine motor skills as well as coordination.  She was left seated in w/c next to bed with call bell and phone within reach.  Continue OT POC.   Therapy Documentation Precautions:  Precautions Precautions: Cervical, Fall Precaution Booklet Issued: No Precaution Comments: [place] Required Braces or Orthoses: Cervical Brace Cervical Brace: Hard collar Restrictions Weight Bearing Restrictions: No General:    Pain: "just a little bit of arthritis in that left knee.  I pressed on it too hard for a second with my other leg, but I am alright."  She denied need for pain meds or alternatives.    Therapy/Group: Individual Therapy  Alfredia Ferguson [facility] 10/17/2022, 4:33 PM

## 2022-10-18 LAB — GLUCOSE, CAPILLARY
Glucose-Capillary: 118 mg/dL — ABNORMAL HIGH (ref 70–99)
Glucose-Capillary: 173 mg/dL — ABNORMAL HIGH (ref 70–99)
Glucose-Capillary: 162 mg/dL — ABNORMAL HIGH (ref 70–99)
Glucose-Capillary: 156 mg/dL — ABNORMAL HIGH (ref 70–99)

## 2022-10-18 NOTE — Progress Notes (Signed)
PROGRESS NOTE   Subjective/Complaints:  No issues overnite  ROS:   Pt denies SOB, abd pain, CP, N/V/C/D, and vision changes    Objective:   No results found. No results for input(s): "WBC", "HGB", "HCT", "PLT" in the last 72 hours.  No results for input(s): "NA", "K", "CL", "CO2", "GLUCOSE", "BUN", "CREATININE", "CALCIUM" in the last 72 hours.   Intake/Output Summary (Last 24 hours) at 10/18/2022 1251 Last data filed at 10/18/2022 0804 Gross per 24 hour  Intake 834 ml  Output --  Net 834 ml      Pressure Injury 10/06/22 Buttocks Right Stage 3 -  Full thickness tissue loss. Subcutaneous fat may be visible but bone, tendon or muscle are NOT exposed. see picture in Epic (Active)  10/06/22 1130  Location: Buttocks  Location Orientation: Right  Staging: Stage 3 -  Full thickness tissue loss. Subcutaneous fat may be visible but bone, tendon or muscle are NOT exposed.  Wound Description (Comments): see picture in Epic  Present on Admission: Yes    Physical Exam: Vital Signs Blood pressure (!) 112/58, pulse 81, temperature 98.4 F (36.9 C), temperature source Oral, resp. rate 18, height 5\' 7"  (1.702 m), weight 82.4 kg, SpO2 100 %.    General: No acute distress Mood and affect are appropriate Heart: Regular rate and rhythm no rubs murmurs or extra sounds Lungs: Clear to auscultation, breathing unlabored, no rales or wheezes Abdomen: Positive bowel sounds, soft nontender to palpation, nondistended Extremities: No clubbing, cyanosis, or edema   Musculoskeletal:     Comments: RUE- Bicep 4+/5; triceps 4/5; WE 4-/5; grip 4-/5; FA 4-/5 LUE- Biceps 4-/5; WE 3-/5; Triceps 2/5; Grip 2-/5 and FA 1/5- no real change today on exam RLE- HF, KE 4/5; DF/PF and EHL 4+/5 LLE- DF/PF 4/5 and EHL 4-/5- pt unable to let me uncover her again, she's so cold to test rest of LLE  Skin:    General: Skin is warm and dry.     Comments:  Skin laceration to left facial cheek with stitches in place.  ACDF site clean and dry Stage III- superficial stage III 1.3 x 1.0 x 0.5 cm on coccyx with a few tiny spots that are more superficial around it   Neurological:     Mental Status: She is alert.     Comments: Patient is alert and oriented x3.  Follows full commands. Ox3 No abnl tone or clonus RLE/LLE Assessment/Plan: 1. Functional deficits which require 3+ hours per day of interdisciplinary therapy in a comprehensive inpatient rehab setting. Physiatrist is providing close team supervision and 24 hour management of active medical problems listed below. Physiatrist and rehab team continue to assess barriers to discharge/monitor patient progress toward functional and medical goals  Care Tool:  Bathing    Body parts bathed by patient: Chest, Front perineal area, Abdomen, Face, Left arm, Right upper leg, Left upper leg   Body parts bathed by helper: Right arm, Right lower leg, Left lower leg, Buttocks     Bathing assist Assist Level: Moderate Assistance - Patient 50 - 74%     Upper Body Dressing/Undressing Upper body dressing   What is the patient wearing?: Pull  over shirt    Upper body assist Assist Level: Moderate Assistance - Patient 50 - 74%    Lower Body Dressing/Undressing Lower body dressing      What is the patient wearing?: Underwear/pull up, Incontinence brief     Lower body assist Assist for lower body dressing: Total Assistance - Patient < 25%     Toileting Toileting    Toileting assist Assist for toileting: 2 Helpers     Transfers Chair/bed transfer  Transfers assist     Chair/bed transfer assist level: 2 Helpers     Locomotion Ambulation   Ambulation assist   Ambulation activity did not occur: Safety/medical concerns          Walk 10 feet activity   Assist  Walk 10 feet activity did not occur: Safety/medical concerns        Walk 50 feet activity   Assist Walk 50 feet  with 2 turns activity did not occur: Safety/medical concerns         Walk 150 feet activity   Assist Walk 150 feet activity did not occur: Safety/medical concerns         Walk 10 feet on uneven surface  activity   Assist Walk 10 feet on uneven surfaces activity did not occur: Safety/medical concerns         Wheelchair     Assist Is the patient using a wheelchair?: Yes Type of Wheelchair: Manual    Wheelchair assist level: Dependent - Patient 0%      Wheelchair 50 feet with 2 turns activity    Assist        Assist Level: Dependent - Patient 0%   Wheelchair 150 feet activity     Assist      Assist Level: Dependent - Patient 0%   Blood pressure (!) 112/58, pulse 81, temperature 98.4 F (36.9 C), temperature source Oral, resp. rate 18, height 5\' 7"  (1.702 m), weight 82.4 kg, SpO2 100 %.  Medical Problem List and Plan: 1. 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  Status post ACDF C4-5 10/02/2022 per Dr.Yarbrough.  Cervical collar when out of bed applied in sitting position             -patient may  shower- monitor skin             -ELOS/Goals: 3-4 weeks- min A hopefully  D/c 11/22  Con't CIR- PT, and OT -DVT/anticoagulation:  Pharmaceutical: Lovenox.   Venous Doppler studies 10/07/2022 negative  11/10- saw getting lovenox BID - will make 40 mg daily.              -antiplatelet therapy: N/A 3. Pain Management: Neurontin 300 mg twice daily, Robaxin and oxycodone as needed  11/3- pain 8-9/10 this Am, but was due for pain meds- con't regimen for now- will increase to 600 mg BID but no higher due to some elevation/Cr of 1.4-1.5  11/6- pain controlled- con't regimen  11/7- will add voltaren gel 2G QID for hands/feet  11/9- pain controlled with meds- con't regimen 4.  Mood/Behavior/Sleep: Lexapro 25 mg daily             -antipsychotic agents: N/A 5. Neuropsych/cognition: This patient is capable of making decisions on her own behalf. 6. Skin/Wound Care left facial cheek laceration: Status post sutures received in ED.  Routine skin checks 7. Fluids/Electrolytes/Nutrition:   -  will adv to regular diet. She can make choices as to what she can chew or not 8.  Diabetes mellitus.  Hemoglobin A1c 5.9.  Currently maintained on Semglee 8 units daily.  Patient on Tresiba 8 units daily prior to admission  11/6- CBG's labile- 96-222-  11/10- CBG's 78- 193= with 1 value of 215- but otherwise pretty well controlled- will monitor 9.  AKI on CKD stage III.  Baseline creatinine 1.18-1.26.  Follow-up chemistries  11/3-4- Cr up to 1.41- will recheck Monday and push fluids  11/8- Cr 1.35- but has been hanging around 1.2 to 1.4-  10.  Acute on chronic anemia.  Follow-up CBC 11.  Intrahepatic cholangiocarcinoma.  Patient on chemotherapy followed by Dr. Randa Evens.  Follow-up outpatient- port not accessed- missed last chemo treatment-her car broke down- will need to be here 3-4 weeks.   11/3- supposedly got chemo q2 weeks- will need to check with Oncology what to do while here. D/w PA  11/6- they will restart once leaves rehab 12.  Obesity.  BMI 27.62.  Dietary follow-up 13.  Hypertension.  Norvasc 2.5 mg daily.  Monitor with increased mobility 14.  History of tobacco use/COPD.  Continue inhalers as indicated 15.  Hyperlipidemia.  Lipitor 16. Complicated UTI.Empiric IV Rocephin 10/08/2022 changed to keflex for 7 days- pending culture, hopefully- will monitor  11/6- ON Keflex for Proteus- is sensitive-  17. Neurogenic bladder- will do bladder scans q6 hours and cath if volumes >350cc 18. Neurogenic bowel with constipation- will get cleaned out- Sorbitol 45cc now and follow with SSE if no results- might need bowel program- not clear yet.   11/3- cleaned out- was continent  11/7-  last night, required a full disimpaction- has hemorrhoids- will treat- will also change Senna to qAM and add Miralax daily.   11/9- will hold miralax/make prn- since had bowel accidents- due to loose stools- con't senna so doesn't get backed up again. Since on pain meds  11/10- having BM on toilet- feeling better about lack of accidents today 11/12 type 6 BM 19. Stage III coccyx pressure ulcer- on admission- will automatically get WOC consult and determine if needs air mattress, place foam for the moment and turn often- will order turning q2 hours  11/3- will make sure she's on ROHO- seen by WOC- started aquacel with foam dressing to change daily.  20. Shivering/cold- will order kpad to warm her up and to help back pain.   11/3- resolved 21. Leukocytosis-  11/6- no fever- WBC 19.6- up from 14k-    11/8- WBC back down to her "baseline" of 14.2-  22. Hemorrhoids  11/7- adding Anusol supp after dinner at 6pm daily.  23. Hypocalcemia  11/8- will monitor    LOS: 10 days A FACE TO FACE EVALUATION WAS PERFORMED  Charlett Blake 10/18/2022, 12:51 PM

## 2022-10-18 NOTE — Progress Notes (Signed)
Physical Therapy Weekly Progress Note  Patient Details  Name: Janice Short MRN: [id number] Date of Birth: [birth year]  Beginning of progress report period: October 09, 2022 End of progress report period: October 18, 2022  Today's Date: 10/18/2022 PT Individual Time: 1457-1540 PT Individual Time Calculation (min): 43 min   Patient has met 4 of 4 short term goals.  Patient is making steady progress to long term goals due to improvements in strength, balance, coordination, and activity tolerance. Pt largely requires supervision bed mobility, min A for transfers, and CGA with gait with RW. Plan to continue patient education and for family training prior to discharge.   Patient continues to demonstrate the following deficits muscle weakness, decreased cardiorespiratoy endurance, decreased coordination, and decreased standing balance and decreased postural control and therefore will continue to benefit from skilled PT intervention to increase functional independence with mobility.  Patient progressing toward long term goals..  Continue plan of care.  PT Short Term Goals Week 1:  PT Short Term Goal 1 (Week 1): pt will perform supine to sit w/mod assist of 1 PT Short Term Goal 1 - Progress (Week 1): Met PT Short Term Goal 2 (Week 1): pt will perform bed to/from chair transfer w/LRAD w/mod assist of 1 PT Short Term Goal 2 - Progress (Week 1): Met PT Short Term Goal 3 (Week 1): Pt will request assit w/pressure relief consistently per schedule w/minimal cueing/reminders PT Short Term Goal 3 - Progress (Week 1): Met PT Short Term Goal 4 (Week 1): pt will perform sit to stand in LRAD mod assist of 2 PT Short Term Goal 4 - Progress (Week 1): Met Week 2:  PT Short Term Goal 1 (Week 2): Pt will perform sit to stand with CGA and LRAD PT Short Term Goal 2 (Week 2): Pt will require CGA for dynamic standing balance PT Short Term Goal 3 (Week 2): Pt will require CGA for bed to chair transfer  Skilled  Therapeutic Interventions/Progress Updates:      Therapy Documentation Precautions:  Precautions Precautions: Cervical, Fall Precaution Booklet Issued: No Precaution Comments: [place] Required Braces or Orthoses: Cervical Brace Cervical Brace: Hard collar Restrictions Weight Bearing Restrictions: No  Daily Treatment Session:   Pt received seated in w/c at bedside and requests to void. Pt requires min A with stand pivot transfer with grab bar to toilet. Pt continent of bladder. Pt required min A for upper and lower body dressing in seated and standing positions. Pt transported by w/c to dayroom for time management and energy conservation. Pt ambulated ~190 ft with RW and CGA with verbal cues for posture and walker management as pt tends to keep RW away from body. Pt provided with rest break and attempted sit to stand from w/c to practice stair negotiation and unsuccessful after multiple attempts due to fatigue and left shoulder pain. Pt provided with rest for relief and returned to room and left seated in w/c at bedside with all needs in reach. Pt declines safety alarm.    Therapy/Group: Individual Therapy  Verl Dicker Verl Dicker PT, DPT  10/18/2022, 3:59 PM

## 2022-10-18 NOTE — Plan of Care (Signed)
PT upgraded gait goals to supervision level due to improvements in functional mobility, strength, activity tolerance, and coordination.   Problem: RH Ambulation Goal: LTG Patient will ambulate in controlled environment (PT) Description: LTG: Patient will ambulate in a controlled environment, # of feet with assistance (PT). Flowsheets Taken 10/18/2022 1605 by Verl Dicker, PT LTG: Pt will ambulate in controlled environ  assist needed:: (uprgrade goal due to improvements in functional mobility) Supervision/Verbal cueing Taken 10/09/2022 1211 by Jerrilyn [place], PT LTG: Ambulation distance in controlled environment: 100   Problem: RH Ambulation Goal: LTG Patient will ambulate in home environment (PT) Description: LTG: Patient will ambulate in home environment, # of feet with assistance (PT). Flowsheets Taken 10/18/2022 1605 by Verl Dicker, PT LTG: Pt will ambulate in home environ  assist needed:: (upgrade goal due to improvements in functional mobility) Supervision/Verbal cueing Taken 10/09/2022 1211 by Jerrilyn [place], PT LTG: Ambulation distance in home environment: 50

## 2022-10-19 LAB — CBC WITH DIFFERENTIAL/PLATELET
Abs Immature Granulocytes: 0.06 10*3/uL (ref 0.00–0.07)
Basophils Absolute: 0 10*3/uL (ref 0.0–0.1)
Basophils Relative: 0 %
Eosinophils Absolute: 0.2 10*3/uL (ref 0.0–0.5)
Eosinophils Relative: 2 %
HCT: 24.9 % — ABNORMAL LOW (ref 36.0–46.0)
Hemoglobin: 8.2 g/dL — ABNORMAL LOW (ref 12.0–15.0)
Immature Granulocytes: 1 %
Lymphocytes Relative: 16 %
Lymphs Abs: 1.9 10*3/uL (ref 0.7–4.0)
MCH: 31.3 pg (ref 26.0–34.0)
MCHC: 32.9 g/dL (ref 30.0–36.0)
MCV: 95 fL (ref 80.0–100.0)
Monocytes Absolute: 1.4 10*3/uL — ABNORMAL HIGH (ref 0.1–1.0)
Monocytes Relative: 12 %
Neutro Abs: 8.1 10*3/uL — ABNORMAL HIGH (ref 1.7–7.7)
Neutrophils Relative %: 69 %
Platelets: 124 10*3/uL — ABNORMAL LOW (ref 150–400)
RBC: 2.62 MIL/uL — ABNORMAL LOW (ref 3.87–5.11)
RDW: 21.2 % — ABNORMAL HIGH (ref 11.5–15.5)
WBC: 11.7 10*3/uL — ABNORMAL HIGH (ref 4.0–10.5)
nRBC: 0 % (ref 0.0–0.2)

## 2022-10-19 LAB — BASIC METABOLIC PANEL
Anion gap: 8 (ref 5–15)
BUN: 11 mg/dL (ref 8–23)
CO2: 21 mmol/L — ABNORMAL LOW (ref 22–32)
Calcium: 7.6 mg/dL — ABNORMAL LOW (ref 8.9–10.3)
Chloride: 107 mmol/L (ref 98–111)
Creatinine, Ser: 1.44 mg/dL — ABNORMAL HIGH (ref 0.44–1.00)
GFR, Estimated: 41 mL/min — ABNORMAL LOW (ref >60)
Glucose, Bld: 108 mg/dL — ABNORMAL HIGH (ref 70–99)
Potassium: 4.2 mmol/L (ref 3.5–5.1)
Sodium: 136 mmol/L (ref 135–145)

## 2022-10-19 LAB — GLUCOSE, CAPILLARY
Glucose-Capillary: 84 mg/dL (ref 70–99)
Glucose-Capillary: 146 mg/dL — ABNORMAL HIGH (ref 70–99)
Glucose-Capillary: 177 mg/dL — ABNORMAL HIGH (ref 70–99)
Glucose-Capillary: 212 mg/dL — ABNORMAL HIGH (ref 70–99)

## 2022-10-19 NOTE — Progress Notes (Signed)
Physical Therapy Session Note  Patient Details  Name: Janice Short MRN: [id number] Date of Birth: 28-Oct-1961  Today's Date: 10/19/2022 PT Individual Time: 0900-1013, [id number]  PT Individual Time Calculation (min): 73 min, 59 min    Short Term Goals: Week 2:  PT Short Term Goal 1 (Week 2): Pt will perform sit to stand with CGA and LRAD PT Short Term Goal 2 (Week 2): Pt will require CGA for dynamic standing balance PT Short Term Goal 3 (Week 2): Pt will require CGA for bed to chair transfer  Skilled Therapeutic Interventions/Progress Updates:      Therapy Documentation Precautions:  Precautions Precautions: Cervical, Fall Precaution Booklet Issued: No Precaution Comments: [name] Required Braces or Orthoses: Cervical Brace Cervical Brace: Hard collar Restrictions Weight Bearing Restrictions: No  Treatment Session 1:  Pt received seated in w/c in bathroom with NT and PT present for handoff. Pt declines pain and reports she received pain medication prior. Pt requires mod A with sit to stand from hemi-height w/c and for min A with stand pivot to toilet. Pt voided and requires min A with sit to stand from [facility] over toilet. Pt transported to main gym and PT replaced hemi heigh w/c with standard frame as pt presents with increased difficulty with transfers from low height. Pt navigated 8 steps CGA with 2 HR's and requires verbal cues for sequencing. Pt transitioned to blocked practice of 6 sit to stand transfers and requires min A with verbal cues for posterior chain activation. Pt ambulated ~200 CGA with RW with verbal cues for pacing and step length. Pt requires (S) with sit to lying on mat and performed 1 x 10 gluteal bridges. Pt transported to room and left seated in w/c at bedside with all needs in reach.   Treatment Session 2:  Pt received seated in w/c at bedside and without verbal comploaints of pain. Pt agreeable to PT session with emphasis on LE strengthening and gait training.  Pt  propelled w/c 80 ft with supervision for safety and increased time due to decreased activity tolerance. Pt CGA with stand pivot with RW from w/c to mat. Pt participated in blocked practice of total of 10 sit to stand transfers with no AD and able to stand by pushing with bilateral UE from mat at low level height. Pt transitioned to sit to stand transfers from airex foam pad positioned on mat to simulate noncompliant surface (couches, recliners) to prepare for discharge and required SBA for safety. Pt transitioned to gait training with left hand held assist and min A for balance and safety for 55 ft and required seated rest break. Pt ambulated CGA with RW 55 ft to return to mat and participated in mini squats 2 x 10 to facilitate LE strength gains. Pt transported to room and left seated in w/c at bedside. Pt declines chair alarm.   Therapy/Group: Individual Therapy  Verl Dicker Verl Dicker PT, DPT  10/19/2022, 7:40 AM

## 2022-10-19 NOTE — Progress Notes (Signed)
Occupational Therapy Session Note  Patient Details  Name: Janice Short MRN: [id number] Date of Birth: 1961/02/23  Today's Date: 10/19/2022 OT Individual Time: 1300-1400 OT Individual Time Calculation (min): 60 min    Short Term Goals: Week 2:  OT Short Term Goal 1 (Week 2): Pt will complete all grooming sink side w/c level with set up only including [place] OT Short Term Goal 2 (Week 2): Pt will complete toilet and shower bench transfer with modified squat pivot transfer x 1 person with mod A OT Short Term Goal 3 (Week 2): Pt will stand with counter/RW support for 2 minutes for self care OT Short Term Goal 4 (Week 2): Pt will complete UB dressing with set up and LB dressing with min A with AE  Skilled Therapeutic Interventions/Progress Updates:    Upon OT arrival, pt seated in w/c and is agreeable to OT treatment. Treatment intervention with a focus on dynamic standing balance, standing tolerance, strengthening, coordination, endurance, and functional transfers. Pt propels herself ~45 feet using B UE requiring increased time and effort secondary to unevenness in floor. Pt noted to have improved coordination and strength in order to complete this task compared to the other day. Pt was transported to tabletop and completes sit to stand transfer with CGA to stand at tabletop without UE support and fold towels and washcloths using B UE. Pt completes with CGA requiring mod verbal cues to correct posture secondary to trunk flexion. Pt able to stand ~12 minutes to complete. Pt requesting to go on NUSTEP and completes stand pivot transfer with CGA. Pt performs NUSTEP for 7:12 seconds at a slow and steady pace. Pt completes stand pivot transfer back into w/c with CGA  Pt requesting to use the restroom. Pt was transported back to her room via w/c and total A and completes scoot pivot transfer with CGA and manages pants using lateral lean method and was left on toilet to have BM. Nursing aware.   Therapy  Documentation Precautions:  Precautions Precautions: Cervical, Fall Precaution Booklet Issued: No Precaution Comments: [place] Required Braces or Orthoses: Cervical Brace Cervical Brace: Hard collar Restrictions Weight Bearing Restrictions: No   Therapy/Group: Individual Therapy  Marvetta Gibbons 10/19/2022, 2:19 PM

## 2022-10-20 LAB — GLUCOSE, CAPILLARY
Glucose-Capillary: 86 mg/dL (ref 70–99)
Glucose-Capillary: 199 mg/dL — ABNORMAL HIGH (ref 70–99)
Glucose-Capillary: 189 mg/dL — ABNORMAL HIGH (ref 70–99)
Glucose-Capillary: 189 mg/dL — ABNORMAL HIGH (ref 70–99)

## 2022-10-20 MED ORDER — ESCITALOPRAM OXALATE 10 MG PO TABS
20.0000 mg | ORAL_TABLET | Freq: Every day | ORAL | Status: DC
  Administered 2022-10-20 – 2022-10-28 (×9): 20 mg via ORAL
  Filled 2022-10-20 (×9): qty 2

## 2022-10-20 MED ORDER — OXYCODONE HCL 5 MG PO TABS
5.0000 mg | ORAL_TABLET | ORAL | Status: DC | PRN
  Administered 2022-10-20 – 2022-10-27 (×23): 5 mg via ORAL
  Filled 2022-10-20 (×25): qty 1

## 2022-10-20 MED ORDER — SENNOSIDES-DOCUSATE SODIUM 8.6-50 MG PO TABS
1.0000 | ORAL_TABLET | Freq: Every day | ORAL | Status: DC
  Administered 2022-10-20 – 2022-10-28 (×9): 1 via ORAL
  Filled 2022-10-20 (×9): qty 1

## 2022-10-20 MED ORDER — METHOCARBAMOL 500 MG PO TABS
1000.0000 mg | ORAL_TABLET | Freq: Four times a day (QID) | ORAL | Status: DC
  Administered 2022-10-20 – 2022-10-26 (×24): 1000 mg via ORAL
  Filled 2022-10-20 (×25): qty 2

## 2022-10-20 NOTE — Progress Notes (Signed)
PROGRESS NOTE   Subjective/Complaints:  Pt reports overdid in therapy yesterday.   Now L shoulder is really bothering her- couldn't sleep well as a result- per chart, hasn't taken robaxin in 25+ hours.  Been up since 2am.   Ate 100% tray except 2nd OJ didn't drink.   Bowels a little soft- would like to reduce Senna/bowel meds a little.    ROS:    Pt denies SOB, abd pain, CP, N/V/C/D, and vision changes   Objective:   No results found. Recent Labs    10/19/22 0451  WBC 11.7*  HGB 8.2*  HCT 24.9*  PLT 124*   Recent Labs    10/19/22 0451  NA 136  K 4.2  CL 107  CO2 21*  GLUCOSE 108*  BUN 11  CREATININE 1.44*  CALCIUM 7.6*    Intake/Output Summary (Last 24 hours) at 10/20/2022 0910 Last data filed at 10/20/2022 0846 Gross per 24 hour  Intake 713 ml  Output 250 ml  Net 463 ml     Pressure Injury 10/06/22 Buttocks Right Stage 3 -  Full thickness tissue loss. Subcutaneous fat may be visible but bone, tendon or muscle are NOT exposed. see picture in Epic (Active)  10/06/22 1130  Location: Buttocks  Location Orientation: Right  Staging: Stage 3 -  Full thickness tissue loss. Subcutaneous fat may be visible but bone, tendon or muscle are NOT exposed.  Wound Description (Comments): see picture in Epic  Present on Admission: Yes    Physical Exam: Vital Signs Blood pressure (!) 123/57, pulse 70, temperature 97.7 F (36.5 C), temperature source Oral, resp. rate 16, height 5\' 7"  (1.702 m), weight 82.4 kg, SpO2 99 %.     General: awake, alert, appropriate, sitting EOB; c/o L shoulder pain; NAD HENT: conjugate gaze; oropharynx moist CV: regular rate; no JVD Pulmonary: CTA B/L; no W/R/R- good air movement GI: soft, NT, ND, (+)BS Psychiatric: appropriate- but frustrated due to pain Neurological: Ox3  Musculoskeletal: L upper trap is very TTP and extremely tight with trigger points palpable.      Comments: RUE- Bicep 4+/5; triceps 4/5; WE 4-/5; grip 4-/5; FA 4-/5 LUE- Biceps 4-/5; WE 3-/5; Triceps 2/5; Grip 2-/5 and FA 1/5- no real change today on exam RLE- HF, KE 4/5; DF/PF and EHL 4+/5 LLE- DF/PF 4/5 and EHL 4-/5- pt unable to let me uncover her again, she's so cold to test rest of LLE  Skin:    General: Skin is warm and dry.     Comments: Skin laceration to left facial cheek with stitches in place.  ACDF site clean and dry Stage III- superficial stage III 1.3 x 1.0 x 0.5 cm on coccyx with a few tiny spots that are more superficial around it   Neurological:     Mental Status: She is alert.     Comments: Patient is alert and oriented x3.  Follows full commands. Ox3 No abnl tone or clonus RLE/LLE Assessment/Plan: 1. Functional deficits which require 3+ hours per day of interdisciplinary therapy in a comprehensive inpatient rehab setting. Physiatrist is providing close team supervision and 24 hour management of active medical problems listed below. Physiatrist and rehab  team continue to assess barriers to discharge/monitor patient progress toward functional and medical goals  Care Tool:  Bathing    Body parts bathed by patient: Chest, Front perineal area, Abdomen, Face, Left arm, Right upper leg, Left upper leg   Body parts bathed by helper: Right arm, Right lower leg, Left lower leg, Buttocks     Bathing assist Assist Level: Moderate Assistance - Patient 50 - 74%     Upper Body Dressing/Undressing Upper body dressing   What is the patient wearing?: Pull over shirt    Upper body assist Assist Level: Moderate Assistance - Patient 50 - 74%    Lower Body Dressing/Undressing Lower body dressing      What is the patient wearing?: Underwear/pull up, Incontinence brief     Lower body assist Assist for lower body dressing: Total Assistance - Patient < 25%     Toileting Toileting    Toileting assist Assist for toileting: 2 Helpers     Transfers Chair/bed  transfer  Transfers assist     Chair/bed transfer assist level: 2 Helpers     Locomotion Ambulation   Ambulation assist   Ambulation activity did not occur: Safety/medical concerns          Walk 10 feet activity   Assist  Walk 10 feet activity did not occur: Safety/medical concerns        Walk 50 feet activity   Assist Walk 50 feet with 2 turns activity did not occur: Safety/medical concerns         Walk 150 feet activity   Assist Walk 150 feet activity did not occur: Safety/medical concerns         Walk 10 feet on uneven surface  activity   Assist Walk 10 feet on uneven surfaces activity did not occur: Safety/medical concerns         Wheelchair     Assist Is the patient using a wheelchair?: Yes Type of Wheelchair: Manual    Wheelchair assist level: Dependent - Patient 0%      Wheelchair 50 feet with 2 turns activity    Assist        Assist Level: Dependent - Patient 0%   Wheelchair 150 feet activity     Assist      Assist Level: Dependent - Patient 0%   Blood pressure (!) 123/57, pulse 70, temperature 97.7 F (36.5 C), temperature source Oral, resp. rate 16, height 5\' 7"  (1.702 m), weight 82.4 kg, SpO2 99 %.  Medical Problem List and Plan: 1. 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  Status post ACDF C4-5 10/02/2022 per Dr.Yarbrough.  Cervical collar when out of bed applied in sitting position             -patient may  shower- monitor skin             -ELOS/Goals: 3-4 weeks- min A hopefully  D/c 11/22  Con't CIR- PT and OT  Team conference today to f/u on progress -DVT/anticoagulation:  Pharmaceutical: Lovenox.   Venous Doppler studies 10/07/2022 negative  11/10- saw getting lovenox BID - will make 40 mg daily.              -antiplatelet  therapy: N/A 3. Pain Management: Neurontin 300 mg twice daily, Robaxin and oxycodone as needed  11/3- pain 8-9/10 this Am, but was due for pain meds-  con't regimen for now- will increase to 600 mg BID but no higher due to some elevation/Cr of 1.4-1.5  11/6- pain controlled- con't regimen  11/7- will add voltaren gel 2G QID for hands/feet  11/9- pain controlled with meds- con't regimen  11/14- pain in L shoulder- is myofascial- will schedule robaxin 1000 mg QID 4. Mood/Behavior/Sleep: Lexapro 25 mg daily             -antipsychotic agents: N/A 5. Neuropsych/cognition: This patient is capable of making decisions on her own behalf. 6. Skin/Wound Care left facial cheek laceration: Status post sutures received in ED.  Routine skin checks 7. Fluids/Electrolytes/Nutrition:   -will adv to regular diet. She can make choices as to what she can chew or not 8.  Diabetes mellitus.  Hemoglobin A1c 5.9.  Currently maintained on Semglee 8 units daily.  Patient on Tresiba 8 units daily prior to admission  11/6- CBG's labile- 96-222-  11/10- CBG's 78- 193= with 1 value of 215- but otherwise pretty well controlled- will monitor 9.  AKI on CKD stage III.  Baseline creatinine 1.18-1.26.  Follow-up chemistries  11/3-4- Cr up to 1.41- will recheck Monday and push fluids  11/8- Cr 1.35- but has been hanging around 1.2 to 1.4-   11/14- Cr slightly more at 1.44- will push fluids- because BUN 11 10.  Acute on chronic anemia.  Follow-up CBC 11.  Intrahepatic cholangiocarcinoma.  Patient on chemotherapy followed by Dr. Randa Evens.  Follow-up outpatient- port not accessed- missed last chemo treatment-her car broke down- will need to be here 3-4 weeks.   11/3- supposedly got chemo q2 weeks- will need to check with Oncology what to do while here. D/w PA  11/6- they will restart once leaves rehab 12.  Obesity.  BMI 27.62.  Dietary follow-up 13.  Hypertension.  Norvasc 2.5 mg daily.  Monitor with increased mobility 14.   History of tobacco use/COPD.  Continue inhalers as indicated 15.  Hyperlipidemia.  Lipitor 16. Complicated UTI.Empiric IV Rocephin 10/08/2022 changed to keflex for 7 days- pending culture, hopefully- will monitor  11/6- ON Keflex for Proteus- is sensitive-  17. Neurogenic bladder- will do bladder scans q6 hours and cath if volumes >350cc  11/14- hasn't required caths.  18. Neurogenic bowel with constipation- will get cleaned out- Sorbitol 45cc now and follow with SSE if no results- might need bowel program- not clear yet.   11/3- cleaned out- was continent  11/7- last night, required a full disimpaction- has hemorrhoids- will treat- will also change Senna to qAM and add Miralax daily.   11/9- will hold miralax/make prn- since had bowel accidents- due to loose stools- con't senna so doesn't get backed up again. Since on pain meds  11/10- having BM on toilet- feeling better about lack of accidents today 11/12 type 6 BM  11/14- said going- but a little soft- will decrease Senna to 1 tab and con't miralax.  19. Stage III coccyx pressure ulcer- on admission- will automatically get WOC consult and determine if needs air mattress, place foam for the moment and turn often- will order turning q2 hours  11/3- will make sure she's on ROHO- seen by WOC- started aquacel with foam dressing to change daily.  20. Shivering/cold- will order kpad to warm her up and to help back pain.   11/3- resolved 21. Leukocytosis-  11/6- no fever- WBC 19.6- up from 14k-    11/8- WBC back down to her "baseline" of 14.2- 11/14- WBC down to 11.7-  doing better-   22. Hemorrhoids  11/7- adding Anusol supp after dinner at 6pm daily.  23. Hypocalcemia  11/8- will monitor  11/14- Ca 7.6- will add Ca carbonate   I spent a total of 37   minutes on total care today- >50% coordination of care- due to team conference and d/w nursing about meds.     LOS: 12 days A FACE TO FACE EVALUATION WAS PERFORMED  Timothee Gali 10/20/2022, 9:10 AM

## 2022-10-20 NOTE — Discharge Summary (Incomplete)
Physician Discharge Summary  Patient ID: Janice Short MRN: [id number] DOB/AGE: [birth year] 61 y.o.  Admit date: 10/08/2022 Discharge date: 10/28/2022  Discharge Diagnoses:  Principal Problem:   Acute incomplete quadriplegia ([place]) Active Problems:   Intrahepatic cholangiocarcinoma ([place])   Central cord syndrome (HCC)   Neurogenic bowel   Pressure ulcer, stage 3 (HCC) DVT prophylaxis Pain management Mood stabilization Diabetes mellitus AKI on CKD stage III Acute on chronic anemia Obesity Hypertension History of tobacco use/COPD Hyperlipidemia UTI Neurogenic bladder  Discharged Condition: Stable  Significant Diagnostic Studies: DG Chest 2 View  Result Date: 10/12/2022 CLINICAL DATA:  Leukocytosis EXAM: CHEST - 2 VIEW COMPARISON:  Chest x-ray October 07, 2022 FINDINGS: Right chest wall port catheter terminates in the right atrium. The cardiomediastinal silhouette is unchanged in contour. Right basilar bandlike opacity. No pleural effusion or pneumothorax. The visualized upper abdomen is unremarkable. No acute osseous abnormality. IMPRESSION: Right basilar bandlike opacity, likely atelectasis. However, aspiration/infection could appear similarly in the appropriate clinical context. Electronically Signed   By: Beryle Flock M.D.   On: 10/12/2022 15:37   US Venous Img Lower Bilateral (DVT)  Result Date: 10/07/2022 CLINICAL DATA:  Injury, fall, fever EXAM: BILATERAL LOWER EXTREMITY VENOUS DOPPLER ULTRASOUND TECHNIQUE: Gray-scale sonography with graded compression, as well as color Doppler and duplex ultrasound were performed to evaluate the lower extremity deep venous systems from the level of the common femoral vein and including the common femoral, femoral, profunda femoral, popliteal and calf veins including the posterior tibial, peroneal and gastrocnemius veins when visible. Spectral Doppler was utilized to evaluate flow at rest and with distal augmentation maneuvers in the common  femoral, femoral and popliteal veins. COMPARISON:  None Available. FINDINGS: RIGHT LOWER EXTREMITY Common Femoral Vein: No evidence of thrombus. Normal compressibility, respiratory phasicity and response to augmentation. Saphenofemoral Junction: No evidence of thrombus. Normal compressibility and flow on color Doppler imaging. Profunda Femoral Vein: No evidence of thrombus. Normal compressibility and flow on color Doppler imaging. Femoral Vein: No evidence of thrombus. Normal compressibility, respiratory phasicity and response to augmentation. Popliteal Vein: No evidence of thrombus. Normal compressibility, respiratory phasicity and response to augmentation. Calf Veins: No evidence of thrombus. Normal compressibility and flow on color Doppler imaging. LEFT LOWER EXTREMITY Common Femoral Vein: No evidence of thrombus. Normal compressibility, respiratory phasicity and response to augmentation. Saphenofemoral Junction: No evidence of thrombus. Normal compressibility and flow on color Doppler imaging. Profunda Femoral Vein: No evidence of thrombus. Normal compressibility and flow on color Doppler imaging. Femoral Vein: No evidence of thrombus. Normal compressibility, respiratory phasicity and response to augmentation. Popliteal Vein: No evidence of thrombus. Normal compressibility, respiratory phasicity and response to augmentation. Calf Veins: No evidence of thrombus. Normal compressibility and flow on color Doppler imaging. IMPRESSION: No evidence of deep venous thrombosis in either lower extremity. Electronically Signed   By: Jerilynn Mages.  Shick M.D.   On: 10/07/2022 18:27   DG Chest 2 View  Result Date: 10/07/2022 CLINICAL DATA:  Fever EXAM: CHEST - 2 VIEW COMPARISON:  10/01/2022 FINDINGS: RIGHT jugular Port-A-Cath with tip projecting over SVC. Normal heart size, mediastinal contours, and pulmonary vascularity. Atherosclerotic calcification aorta. Lungs clear. No infiltrate, pleural effusion, or pneumothorax. Motion  artifacts degrade lateral view. Bones demineralized. IMPRESSION: No acute abnormalities. Aortic Atherosclerosis (ICD10-I70.0). Electronically Signed   By: Lavonia Dana M.D.   On: 10/07/2022 17:49   DG Cervical Spine 2-3 Views  Result Date: 10/02/2022 CLINICAL DATA:  Provided history: Anterior cervical decompression/discectomy with fusion of C4-C5. Provided fluoroscopy time:  2 seconds EXAM: CERVICAL SPINE - 2-3 VIEW COMPARISON:  Cervical spine MRI 10/01/2022. Cervical spine CT 10/01/2022. FINDINGS: Three intraoperative fluoroscopic images of the cervical spine are submitted. On the initial lateral projection fluoroscopic image, a metallic surgical instrument projects anterior to the C4-C5 disc space. On the subsequent fluoroscopic images acquired at 9:23 a.m., ACDF hardware and an interbody device are present at the C4-C5 level. A known acute fracture of the C2 body was better appreciated on the prior cervical spine CT of 10/01/2022. Partially imaged ET tube. IMPRESSION: Three intraoperative fluoroscopic images of the cervical spine from C4-C5 ACDF, as described. Electronically Signed   By: Kellie Simmering D.O.   On: 10/02/2022 09:47   DG C-Arm 1-60 Min-No Report  Result Date: 10/02/2022 Fluoroscopy was utilized by the requesting physician.  No radiographic interpretation.   MR Cervical Spine Wo Contrast  Result Date: 10/01/2022 CLINICAL DATA:  Neck trauma.  Fall EXAM: MRI CERVICAL SPINE WITHOUT CONTRAST TECHNIQUE: Multiplanar, multisequence MR imaging of the cervical spine was performed. No intravenous contrast was administered. COMPARISON:  CT cervical spine 10/03/2022 FINDINGS: Alignment: Mild anterolisthesis C3-4. Straightening of the cervical lordosis Vertebrae: No fracture or bone marrow edema. Small cortical fracture of the anterior inferior C2 vertebral body is seen on CT but not appreciated on MRI. Cord: Bilateral cord hyperintensity at C4-5 which appears chronic and related to compressive  myelopathy. No other cord signal abnormality Posterior Fossa, vertebral arteries, paraspinal tissues: Negative. No prevertebral soft tissue swelling or edema. Disc levels: C2-3: Negative for stenosis C3-4: Small central disc protrusion with calcification. Mild cord flattening and mild spinal stenosis. Mild foraminal narrowing bilaterally due to spurring. C4-5: Moderately large central disc protrusion with associated spurring. Cord flattening with moderate spinal stenosis. Small cord hyperintensity bilaterally which appears chronic. Moderate foraminal narrowing bilaterally due to spurring C5-6: Mild disc degeneration and spurring. Mild foraminal narrowing bilaterally. Mild central canal stenosis. C6-7: Mild disc degeneration. Moderate left foraminal narrowing due to spurring C7-T1: Negative IMPRESSION: 1. Nondisplaced fracture anterior inferior body of C2 not appreciated by MRI. No other fracture. No epidural hematoma. 2. Multilevel degenerative change most prominent at C4-5. Moderate spinal stenosis at C4-5 with bilateral cord hyperintensity compatible with chronic compressive myelopathy. Electronically Signed   By: Franchot Gallo M.D.   On: 10/01/2022 20:17   CT Head Wo Contrast  Result Date: 10/01/2022 CLINICAL DATA:  Fall with laceration to right cheek EXAM: CT HEAD WITHOUT CONTRAST CT MAXILLOFACIAL WITHOUT CONTRAST CT CERVICAL SPINE WITHOUT CONTRAST TECHNIQUE: Multidetector CT imaging of the head, cervical spine, and maxillofacial structures were performed using the standard protocol without intravenous contrast. Multiplanar CT image reconstructions of the cervical spine and maxillofacial structures were also generated. RADIATION DOSE REDUCTION: This exam was performed according to the departmental dose-optimization program which includes automated exposure control, adjustment of the mA and/or kV according to patient size and/or use of iterative reconstruction technique. COMPARISON:  CT 08/30/2021 FINDINGS:  CT HEAD FINDINGS Brain: No acute territorial infarction, hemorrhage or intracranial mass. Patchy white matter hypodensity likely chronic small vessel ischemic change. The ventricles are nonenlarged. Vascular: No hyperdense vessels.  Carotid vascular calcification Skull: Normal. Negative for fracture or focal lesion. Other: Opacified right maxillary sinus. Mucosal thickening in the ethmoid sinuses CT MAXILLOFACIAL FINDINGS Osseous: Mastoid air cells are clear. Mandibular heads are normally position. No mandibular fracture. Pterygoid plates and zygomatic arches appear intact. No acute nasal bone fracture Orbits: Negative. No traumatic or inflammatory finding. Sinuses: Completely opacified right maxillary sinus. Moderate mucosal  thickening in the ethmoid sinuses. No sinus wall fracture. Soft tissues: Moderate to large right periorbital and infraorbital soft tissue hematoma with small foci of gas consistent with laceration CT CERVICAL SPINE FINDINGS Alignment: Straightening of the cervical spine. No subluxation. Facet alignment within normal limits. Skull base and vertebrae: Vertebral body heights are maintained. Suspected nondisplaced fracture lucency involving the anterior inferior corner of the C2 vertebral body, best seen on sagittal images. Correlate is seen on axial series 9, image 28. No other discrete fractures are visualized. Soft tissues and spinal canal: No significant prevertebral soft tissue enlargement. Disc levels: No abnormal widening or narrowing of C2-C3 disc space. There are multilevel degenerative changes. Moderate severe disc space narrowing and degenerative change C4-C5, C5-C6, C6-C7 and C7-T1. Facet degenerative changes at multiple levels with foraminal narrowing Upper chest: Negative. Other: None IMPRESSION: 1. No CT evidence for acute intracranial abnormality. Patchy white matter hypodensity likely chronic small vessel ischemic change. 2. Findings consistent with nondisplaced teardrop type  fracture involving the anterior inferior aspect of the C2 vertebral body. 3. Multilevel moderate severe degenerative changes Critical Value/emergent results were called by telephone at the time of interpretation on 10/01/2022 at 5:56 pm to provider [facility] , who verbally acknowledged these results. Electronically Signed   By: Donavan Foil M.D.   On: 10/01/2022 17:56   CT Cervical Spine Wo Contrast  Result Date: 10/01/2022 CLINICAL DATA:  Fall with laceration to right cheek EXAM: CT HEAD WITHOUT CONTRAST CT MAXILLOFACIAL WITHOUT CONTRAST CT CERVICAL SPINE WITHOUT CONTRAST TECHNIQUE: Multidetector CT imaging of the head, cervical spine, and maxillofacial structures were performed using the standard protocol without intravenous contrast. Multiplanar CT image reconstructions of the cervical spine and maxillofacial structures were also generated. RADIATION DOSE REDUCTION: This exam was performed according to the departmental dose-optimization program which includes automated exposure control, adjustment of the mA and/or kV according to patient size and/or use of iterative reconstruction technique. COMPARISON:  CT 08/30/2021 FINDINGS: CT HEAD FINDINGS Brain: No acute territorial infarction, hemorrhage or intracranial mass. Patchy white matter hypodensity likely chronic small vessel ischemic change. The ventricles are nonenlarged. Vascular: No hyperdense vessels.  Carotid vascular calcification Skull: Normal. Negative for fracture or focal lesion. Other: Opacified right maxillary sinus. Mucosal thickening in the ethmoid sinuses CT MAXILLOFACIAL FINDINGS Osseous: Mastoid air cells are clear. Mandibular heads are normally position. No mandibular fracture. Pterygoid plates and zygomatic arches appear intact. No acute nasal bone fracture Orbits: Negative. No traumatic or inflammatory finding. Sinuses: Completely opacified right maxillary sinus. Moderate mucosal thickening in the ethmoid sinuses. No sinus wall  fracture. Soft tissues: Moderate to large right periorbital and infraorbital soft tissue hematoma with small foci of gas consistent with laceration CT CERVICAL SPINE FINDINGS Alignment: Straightening of the cervical spine. No subluxation. Facet alignment within normal limits. Skull base and vertebrae: Vertebral body heights are maintained. Suspected nondisplaced fracture lucency involving the anterior inferior corner of the C2 vertebral body, best seen on sagittal images. Correlate is seen on axial series 9, image 28. No other discrete fractures are visualized. Soft tissues and spinal canal: No significant prevertebral soft tissue enlargement. Disc levels: No abnormal widening or narrowing of C2-C3 disc space. There are multilevel degenerative changes. Moderate severe disc space narrowing and degenerative change C4-C5, C5-C6, C6-C7 and C7-T1. Facet degenerative changes at multiple levels with foraminal narrowing Upper chest: Negative. Other: None IMPRESSION: 1. No CT evidence for acute intracranial abnormality. Patchy white matter hypodensity likely chronic small vessel ischemic change. 2. Findings consistent with  nondisplaced teardrop type fracture involving the anterior inferior aspect of the C2 vertebral body. 3. Multilevel moderate severe degenerative changes Critical Value/emergent results were called by telephone at the time of interpretation on 10/01/2022 at 5:56 pm to provider [facility] , who verbally acknowledged these results. Electronically Signed   By: Donavan Foil M.D.   On: 10/01/2022 17:56   CT Maxillofacial WO CM  Result Date: 10/01/2022 CLINICAL DATA:  Fall with laceration to right cheek EXAM: CT HEAD WITHOUT CONTRAST CT MAXILLOFACIAL WITHOUT CONTRAST CT CERVICAL SPINE WITHOUT CONTRAST TECHNIQUE: Multidetector CT imaging of the head, cervical spine, and maxillofacial structures were performed using the standard protocol without intravenous contrast. Multiplanar CT image reconstructions of  the cervical spine and maxillofacial structures were also generated. RADIATION DOSE REDUCTION: This exam was performed according to the departmental dose-optimization program which includes automated exposure control, adjustment of the mA and/or kV according to patient size and/or use of iterative reconstruction technique. COMPARISON:  CT 08/30/2021 FINDINGS: CT HEAD FINDINGS Brain: No acute territorial infarction, hemorrhage or intracranial mass. Patchy white matter hypodensity likely chronic small vessel ischemic change. The ventricles are nonenlarged. Vascular: No hyperdense vessels.  Carotid vascular calcification Skull: Normal. Negative for fracture or focal lesion. Other: Opacified right maxillary sinus. Mucosal thickening in the ethmoid sinuses CT MAXILLOFACIAL FINDINGS Osseous: Mastoid air cells are clear. Mandibular heads are normally position. No mandibular fracture. Pterygoid plates and zygomatic arches appear intact. No acute nasal bone fracture Orbits: Negative. No traumatic or inflammatory finding. Sinuses: Completely opacified right maxillary sinus. Moderate mucosal thickening in the ethmoid sinuses. No sinus wall fracture. Soft tissues: Moderate to large right periorbital and infraorbital soft tissue hematoma with small foci of gas consistent with laceration CT CERVICAL SPINE FINDINGS Alignment: Straightening of the cervical spine. No subluxation. Facet alignment within normal limits. Skull base and vertebrae: Vertebral body heights are maintained. Suspected nondisplaced fracture lucency involving the anterior inferior corner of the C2 vertebral body, best seen on sagittal images. Correlate is seen on axial series 9, image 28. No other discrete fractures are visualized. Soft tissues and spinal canal: No significant prevertebral soft tissue enlargement. Disc levels: No abnormal widening or narrowing of C2-C3 disc space. There are multilevel degenerative changes. Moderate severe disc space narrowing  and degenerative change C4-C5, C5-C6, C6-C7 and C7-T1. Facet degenerative changes at multiple levels with foraminal narrowing Upper chest: Negative. Other: None IMPRESSION: 1. No CT evidence for acute intracranial abnormality. Patchy white matter hypodensity likely chronic small vessel ischemic change. 2. Findings consistent with nondisplaced teardrop type fracture involving the anterior inferior aspect of the C2 vertebral body. 3. Multilevel moderate severe degenerative changes Critical Value/emergent results were called by telephone at the time of interpretation on 10/01/2022 at 5:56 pm to provider [facility] , who verbally acknowledged these results. Electronically Signed   By: Donavan Foil M.D.   On: 10/01/2022 17:56   DG Chest 1 View  Result Date: 10/01/2022 CLINICAL DATA:  [place] EXAM: CHEST  1 VIEW COMPARISON:  08/30/2021 FINDINGS: Single frontal view of the chest demonstrates a right chest wall port via internal jugular approach, tip overlying the atriocaval junction. Cardiac silhouette is unremarkable. No acute airspace disease, effusion, or pneumothorax. There are no acute bony abnormalities. IMPRESSION: 1. No acute intrathoracic process. Electronically Signed   By: Randa Ngo M.D.   On: 10/01/2022 17:54   DG Knee Complete 4 Views Left  Result Date: 10/01/2022 CLINICAL DATA:  [place], left leg deformity EXAM: LEFT KNEE - COMPLETE 4+  VIEW COMPARISON:  12/03/2016 FINDINGS: Frontal, bilateral oblique, and cross-table lateral views of the left knee are obtained. No acute displaced fracture, subluxation, or dislocation. Moderate 3 compartmental osteoarthritis greatest in the patellofemoral compartment. Synovial osteochondromatosis again noted. Small joint effusion. Prepatellar soft tissue edema. IMPRESSION: 1. No acute displaced fracture. 2. Moderate osteoarthritis with likely reactive joint effusion. 3. Prepatellar soft tissue edema. Electronically Signed   By: Randa Ngo M.D.   On: 10/01/2022  17:53   DG Hip Unilat With Pelvis 2-3 Views Left  Result Date: 10/01/2022 CLINICAL DATA:  [place], left leg deformity EXAM: DG HIP (WITH OR WITHOUT PELVIS) 2-3V LEFT COMPARISON:  None Available. FINDINGS: Frontal view of the pelvis as well as a frontal and cross-table lateral view of the left hip are obtained. There is internal rotation of the left hip, with no acute fracture, subluxation, or dislocation. Symmetrical bilateral hip osteoarthritis. Soft tissues are unremarkable. Sacroiliac joints are normal. IMPRESSION: 1. No acute displaced fracture. If fracture remains a clinical concern, CT or MRI be performed. 2. Symmetrical bilateral hip osteoarthritis. Electronically Signed   By: Randa Ngo M.D.   On: 10/01/2022 17:52   CT HIP LEFT WO CONTRAST  Result Date: 10/01/2022 CLINICAL DATA:  Blunt facial trauma, fall striking bed rail. Shortening of left leg. EXAM: CT OF THE LEFT HIP WITHOUT CONTRAST TECHNIQUE: Multidetector CT imaging of the left hip was performed according to the standard protocol. Multiplanar CT image reconstructions were also generated. RADIATION DOSE REDUCTION: This exam was performed according to the departmental dose-optimization program which includes automated exposure control, adjustment of the mA and/or kV according to patient size and/or use of iterative reconstruction technique. COMPARISON:  Radiographs 10/01/2022 FINDINGS: Bones/Joint/Cartilage Moderate to prominent loss of articular space in the left hip with marginal spurring along the acetabulum and femoral head. No fracture or acute bony findings identified. Ligaments Suboptimally assessed by CT. Muscles and Tendons Unremarkable Soft tissues Arterial atherosclerosis. IMPRESSION: 1. No fracture or acute bony findings. 2. Moderate to prominent left hip osteoarthritis. 3. Arterial atherosclerosis. Electronically Signed   By: Van Clines M.D.   On: 10/01/2022 17:48    Labs:  Basic Metabolic Panel: Recent Labs  Lab  10/19/22 0451  NA 136  K 4.2  CL 107  CO2 21*  GLUCOSE 108*  BUN 11  CREATININE 1.44*  CALCIUM 7.6*    CBC: Recent Labs  Lab 10/19/22 0451  WBC 11.7*  NEUTROABS 8.1*  HGB 8.2*  HCT 24.9*  MCV 95.0  PLT 124*    CBG: Recent Labs  Lab 10/24/22 1134 10/24/22 1650 10/24/22 2112 10/25/22 0620 10/25/22 1129  GLUCAP 239* 139* 171* 90 174*   Family history.  Mother with CAD father with kidney disease.  Denies any colon cancer esophageal cancer or rectal cancer  Brief HPI:   Janice Short is a 61 y.o. right-handed female with history of diabetes mellitus CKD stage III chronic anemia chronic bronchitis intrahepatic cholangiocarcinoma on chemotherapy last infused 09/18/2022 followed by [name], hypertension, hyperlipidemia gout obesity with BMI 27.62 tobacco use.  Per chart review lives with daughter and 49-year-old grandson.  Mobile home with ramped entrance.  Reportedly independent prior to admission.  Presented to [facility] 10/01/2022 following mechanical fall when she tripped fell backwards striking her face on a railing.  She was unable to help herself up.  She sustained a laceration to the left cheek and complained of left hip pain bilateral upper extremity numbness.  Admission chemistries unremarkable except potassium 5.7 creatinine  1.30 GFR 46 calcium 7.9 glucose 243 hemoglobin 9.7 WBC 12,200.  Patient's laceration to the left cheek was sutured in the ED.  Cranial CT scan negative for intracranial abnormality.  CT maxillofacial negative.  CT left hip negative for fracture.  CT cervical spine finding consistent with nondisplaced teardrop type fracture involving the anterior inferior aspect of C2 vertebral body as well as severe stenosis C4-5 with myelomalacia.  Neurosurgery consulted underwent anterior cervical discectomy decompression and fusion cervical instrumentation C4-5 10/02/2022 per Dr. Izora Ribas.  Hard cervical collar when out of bed applied in sitting position.  Hospital  course AKI on CKD 3 baseline serum creatinine 1.18 > 1.26.  Noted hyperkalemia 5.7 responded well to [facility] with latest potassium 4.2 creatinine 1.50.  Leukocytosis 21,600 improved to 14,000.  Acute on chronic anemia latest hemoglobin 9.6.  She was cleared to begin Lovenox for DVT prophylaxis.  Spiked a low-grade fever 101.1 10/07/2022 with venous Doppler studies negative.  Chest x-ray showed no acute disease, urinalysis negative nitrite WBC improved to 14,000 down from 17,200.  Placed empirically on Rocephin 10/08/2022 changed to Keflex for 7 days.  Therapy evaluations completed due to patient decreased functional mobility was admitted for a comprehensive rehab program   Hospital Course: Janice Short was admitted to rehab 10/08/2022 for inpatient therapies to consist of PT, ST and OT at least three hours five days a week. Past admission physiatrist, therapy team and rehab RN have worked together to provide customized collaborative inpatient rehab.  In regards to patient's incomplete traumatic C4 [place] C/D quadriplegia/C2 fracture as well as severe stenosis C4-5 myelomalacia status post ACDF C4-5 10/02/2022.  Cervical collar when out of bed.  Follow-up neurosurgery.  Patient on Lovenox for DVT prophylaxis venous Doppler studies negative transition to Eliquis at discharge.  Pain managed with use of scheduled Neurontin with Robaxin and oxycodone as indicated.  Blood sugars controlled hemoglobin A1c 5.9 patient could resume [place] at discharge.  AKI on CKD stage III baseline creatinine 1.18-1.26 with latest creatinine 1.44.  Noted obesity BMI 27.62 dietary follow-up.  Intrahepatic cholangiocarcinoma follow-up chemotherapy with Dr. Janese Banks.  Neurogenic bowel and bladder with full teaching completed and as of 11/14 had not required catheterizations.  Stage III coccyx pressure ulcer wound care nurse follow-up wound care as directed.   Blood pressures were monitored on TID remained soft and monitored  Diabetes has been  monitored with ac/hs CBG checks and SSI was use prn for tighter BS control.    Rehab course: During patient's stay in rehab weekly team conferences were held to monitor patient's progress, set goals and discuss barriers to discharge. At admission, patient required max assist sit to stand max assist supine to sit max assist sit to supine  Physical exam.  Blood pressure 118/58 pulse 80 temperature 99 respirations 18 oxygen saturations 99% room air Constitutional.  No acute distress HEENT Head.  Right cheek laceration healing Eyes.  Pupils round and reactive to light no discharge without nystagmus Neck.  Cervical collar in place Cardiac regular rate and rhythm without any extra sounds or murmur heard Abdomen.  Soft nontender positive bowel sounds without rebound Respiratory effort normal no respiratory distress without wheeze Musculoskeletal Comments.  Right upper extremity biceps 4+/5, triceps 4/5, wrist extension 4 -/5, grip 4 -/5 FA 4 -/5 Left upper extremity biceps 4 -/5 WE 3 -/5 triceps 2/5 grip to minus/5 and FA 1/5 Right lower extremity hip flexors knee extension 4/5 dorsi plantarflexion and EHL 4+/5 Left lower extremity dorsiflexion plantarflexion  4/5 and EHL 4 -/5  Skin.  Laceration left cheek clean and dry.  ACDF site clean and dry Stage III superficial stage III 1.3 x 1.0 x 0.5 cm on coccyx with a few tiny spots superficial.    Media Information   Document Information  Photos  [place] 11/2  10/08/2022 13:57  Attached To:  Hospital Encounter on 10/08/22  Source Information  Lovorn, Megan, MD  [facility]    He/She  has had improvement in activity tolerance, balance, postural control as well as ability to compensate for deficits. He/She has had improvement in functional use RUE/LUE  and RLE/LLE as well as improvement in awareness       Disposition: Discharge to home    Diet:Regular  Special Instructions: No smoking or alcohol  Cervical collar when out of  bed.  Applied in sitting position.  Follow-up with oncology service Dr. Janese Banks in regards to ongoing chemotherapy  Medications at discharge. 1.  Tylenol as needed 2.  Norvasc 2.5 mg p.o. daily 3.  Eliquis 2.5 mg p.o. twice daily 4.  Lipitor 40 mg p.o. daily 5.  Dulcolax suppository daily as needed 6.  Voltaren gel 2 g 4 times daily 7.  Breo Ellipta 100-25 mcg 1 puff daily 8.  Neurontin 600 mg p.o. twice daily 9.  Tresiba 8 units daily 10.  Robaxin 1000 mg p.o. 4 times daily 11.  Oxycodone 5 mg every 4 hours as needed pain 12.  MiraLAX daily as needed 13.  Senokot 1 tablet daily 14.  Zinc sulfate 220 mg p.o. daily 15.  Lexapro 20 mg p.o. daily 16.  Glucophage 500 mg p.o. daily  Discharge Instructions     Ambulatory referral to Physical Medicine Rehab   Complete by: As directed    Moderate complexity follow-up 1 to 2 weeks central cord syndrome     15.  Lexapro 20 mg p.o. daily   Follow-up Information     Lovorn, Jinny Blossom, MD Follow up.   Specialty: Physical Medicine and Rehabilitation Why: Office to call for appointment Contact information: [address] [place] [address] [phone]         Sindy Guadeloupe, MD Follow up.   Specialty: Oncology Why: Call for appointment Contact information: [place] [address] [phone]         Meade Maw, MD Follow up.   Specialty: Neurosurgery Why: Call for appointment Contact information: [address] [place] [address] [phone]                 Signed: Cathlyn Parsons 10/25/2022, 4:03 PM

## 2022-10-20 NOTE — Progress Notes (Signed)
Occupational Therapy Session Note  Patient Details  Name: KRISSY OREBAUGH MRN: [id number] Date of Birth: 21-Nov-1961  Today's Date: 10/20/2022 OT Individual Time: 1300-1415 OT Individual Time Calculation (min): 75 min    Short Term Goals: Week 2:  OT Short Term Goal 1 (Week 2): Pt will complete all grooming sink side w/c level with set up only including [place] OT Short Term Goal 2 (Week 2): Pt will complete toilet and shower bench transfer with modified squat pivot transfer x 1 person with mod A OT Short Term Goal 3 (Week 2): Pt will stand with counter/RW support for 2 minutes for self care OT Short Term Goal 4 (Week 2): Pt will complete UB dressing with set up and LB dressing with min A with AE  Skilled Therapeutic Interventions/Progress Updates:   Pt up in regular w/c upon OT arrival. Requesting alternative leg rests due to current being with broken heel loops. OT replaced. PT feels pt will not require a w/c at discharge therefore OT explored needs at home. Pt reports she sits mostly on EOB at home. May benefit from solid arm chair or recliner and pt considering. Pt transported to and from middle gym for time management and joint protection. As per MD discussion in Team Conf for OT to trial heat, massage and k-taping to shoulders due to pain and stiffness. Pt with 5/10 pain reported. OT applied ktape to L RTC region with + results. Applied moist heat to B shoulders and hands for 10 minutes alternating while pt completed 9HPT R 31.6  sec ; L 3 + min and then unable after 4 pegs. Pop fidget Bly and B table slides 10 reps in all planes. Once outside of room, pt amb with RW with increased time and momentum for sit to stand but then CGA 35 ft into room with w/c follow. Left pt up in w.c with chair exit alarm set, nurse call button and all needs in reach.    Therapy/Group: Individual Therapy  Barnabas Lister 10/20/2022, 7:45 AM

## 2022-10-20 NOTE — Progress Notes (Signed)
Patient ID: Janice Short, female   DOB: 19-Sep-1961, 61 y.o.   MRN: [id number]  Met with pt to update her regarding progress this week in therapies she is very happy with herself and feels she is exceeding our goals. [place]-PT no longer recommends a wheelchair and will only need a rolling walker. Discussed having daughter come in for education prior to discharge, she feels it would be too difficult with 61 yo and has no transportation for this and to uber or lift would be too costly. She feels she is doing well enough this is not needed. Have gotten home health to accept pt and provide therapies. Will continue to work on discharge needs for 11/22.

## 2022-10-20 NOTE — Patient Care Conference (Signed)
Inpatient RehabilitationTeam Conference and Plan of Care Update Date: 10/20/2022   Time: 11:39 AM    Patient Name: Janice Short Record Number: [id number]  Date of Birth: January 28, 1961 Sex: Female         Room/Bed: 4W05C/4W05C-01 Payor Info: Payor: [employer] MEDICARE / Plan: [facility] MEDICARE / Product Type: *No Product type* /    Admit Date/Time:  10/08/2022  1:33 PM  Primary Diagnosis:  Acute incomplete quadriplegia [facility])  Hospital Problems: Principal Problem:   Acute incomplete quadriplegia ([facility]) Active Problems:   Intrahepatic cholangiocarcinoma ([place])   Central cord syndrome ([place])   Neurogenic bowel   Pressure ulcer, stage 3 ([place])    Expected Discharge Date: Expected Discharge Date: 10/28/22  Team Members Present: Physician leading conference: Dr. Courtney Heys Social Worker Present: Ovidio Kin, LCSW Nurse Present: Tacy Learn, RN PT Present: Verl Dicker, PT OT Present: Jamey Ripa, OT PPS Coordinator present : Gunnar Fusi, SLP     Current Status/Progress Goal Weekly Team Focus  Bowel/Bladder   Continent of b/b   Remain continent   Assist with toileting qshift and prn    Swallow/Nutrition/ Hydration               ADL's   min a LB self care, set up Ub self care, toilet transfers min a/CGA   mod I w/c level, CGA toilet transfers   progress RW integ for ADL's    Mobility   supervision bed, SBA transfers & gait x 200 ft   mod I wc level, supervision household ambulation w/LRAD  transfer, gait, activity tolerance, Post chain strengthening, stairs    Communication                Safety/Cognition/ Behavioral Observations               Pain   Pt c/o bilateral shoulder pain and pain in the hands, PRN oxy given and voltarn cream applied   Pain <3/10   Assess pain qshift and prn    Skin   Skin intact, Incision to the left of the neck, intact with skin glue and ota   Prevention of infection  Assess qshift and prn       Discharge Planning:  Pt doing really well in therapies and making lots of progress, will try to get daughter in for education prior to DC next week   Team Discussion: Acute incomplete quadriplegia. Continent B/B. Generalized pain managed with oxycodone and robaxin. Incision healed. No drainage. Stage III to coccyx. Declined family meeting. Therapies going well. OT to continue Estim and will tape shoulder due to traps being tight and may do trigger point injections. Spasticity may be an issue in future.   Patient on target to meet rehab goals: yes, ambulating 200 ft with RW  *See Care Plan and progress notes for long and short-term goals.   Revisions to Treatment Plan:  Medication adjustments, taping shoulder, Estim   Teaching Needs: Medications, safety, gait/transfer training, skin/wound care, etc  Current Barriers to Discharge: Decreased caregiver support, Home enviroment access/layout, Wound care, Medication compliance, and Behavior  Possible Resolutions to Barriers: Family education, nursing education, order recommended DME     Medical Summary Current Status: has STage III sacral wound- incision looks good- no drainage- continent B/B- LBM 11/12 or 11/13-  Barriers to Discharge: Complicated Wound;Uncontrolled Pain;Weight bearing restrictions;Medical stability;Self-care education;Hypotension  Barriers to Discharge Comments: pain limiting, esp L upper traps- might need trigger point injections Possible  Resolutions to [employer]: schedule robaxin for pain- will likely form spasticity- pt was driver, so cannot get to outpt- will need H/H-gait up to 200 ft RW- but is central cord- min A LB due to L hand function- has done estim x2- can tape shoulders- d/c 11/22   Continued Need for Acute Rehabilitation Level of Care: The patient requires daily medical management by a physician with specialized training in physical medicine and rehabilitation for the following  reasons: Direction of a multidisciplinary physical rehabilitation program to maximize functional independence : Yes Medical management of patient stability for increased activity during participation in an intensive rehabilitation regime.: Yes Analysis of laboratory values and/or radiology reports with any subsequent need for medication adjustment and/or medical intervention. : Yes   I attest that I was present, lead the team conference, and concur with the assessment and plan of the team.   Ernest Pine 10/20/2022, 5:47 PM

## 2022-10-20 NOTE — Progress Notes (Signed)
Occupational Therapy Session Note  Patient Details  Name: Janice Short MRN: [id number] Date of Birth: September 21, 1961  Today's Date: 10/20/2022 OT Individual Time: 1135-1200 OT Individual Time Calculation (min): 25 min    Short Term Goals: Week 1:  OT Short Term Goal 1 (Week 1): Patient will maintain sitting balance at EOB with no more than min A with dynamic task OT Short Term Goal 1 - Progress (Week 1): Met OT Short Term Goal 2 (Week 1): Patient will be able to reach under R arm to wash armpit OT Short Term Goal 2 - Progress (Week 1): Met OT Short Term Goal 3 (Week 1): Patient will complete toilet transfer with mod A +2 OT Short Term Goal 3 - Progress (Week 1): Met  Skilled Therapeutic Interventions/Progress Updates:     Pt received in w/c with no pain but cold hands. Edu re use of Kpad and requested from PA.  Therapeutic activity Functional reach in sitting with BUE with AAROM with LUE and tactile cues to decrease shoulder hike with flexion d/t weak anterior deltoid. Pt able to use RUE to place small plastic pegs into peg board and then use tweezers to remove with RUE, and then use L fingers to key pinch and pull out of pegboard for improved dexterity and FMC with moderate grip slips.   Pt left at end of session in w/c with exit alarm on, call light in reach and all needs met   Therapy Documentation Precautions:  Precautions Precautions: Cervical, Fall Precaution Booklet Issued: No Precaution Comments: [place] Required Braces or Orthoses: Cervical Brace Cervical Brace: Hard collar Restrictions Weight Bearing Restrictions: No General:    Therapy/Group: Individual Therapy  Tonny Branch 10/20/2022, 6:58 AM

## 2022-10-20 NOTE — Progress Notes (Signed)
Physical Therapy Session Note  Patient Details  Name: Janice Short MRN: [id number] Date of Birth: 1961-01-24  Today's Date: 10/20/2022 PT Individual Time: 5208-0223, 3612-2449  PT Individual Time Calculation (min): 58 min, 40 min  Short Term Goals: Week 2:  PT Short Term Goal 1 (Week 2): Pt will perform sit to stand with CGA and LRAD PT Short Term Goal 2 (Week 2): Pt will require CGA for dynamic standing balance PT Short Term Goal 3 (Week 2): Pt will require CGA for bed to chair transfer  Skilled Therapeutic Interventions/Progress Updates:      Therapy Documentation Precautions:  Precautions Precautions: Cervical, Fall Precaution Booklet Issued: No Precaution Comments: [place] Required Braces or Orthoses: Cervical Brace Cervical Brace: Hard collar Restrictions Weight Bearing Restrictions: No  Treatment Session 1:  Pt received seated in w/c at sink with nurse present performing self-care tasks. PT present for handoff and pt supervision for safety with self-care tasks and upper body dressing. Pt able to thread LE's through pants and stands with CGA while PT provided total A to raise pants for time management. Pt ambulated ~150 ft to dayroom and utilized kinetron x 8 min at 20 cm/s. Pt participated in six minute walk test to assess activity tolerance with gait. Pt unable to complete full assessment and required seated rest break after walking 120 ft in 3 minutes and 30 seconds. Pt reports at baseline she does not ambulate increased distances as her doctors office is ~20 ft walk and pt has groceries delivered. PT to recommend pt discharge with RW and for pt to continue to work on improving activity tolerance with functional mobility.  Pt ambulated additional ~150 ft to room. Pt left seated in w/c at bedside with all needs in reach. Pt declines chair alarm and without verbal complaints of pain during session.   Treatment Session 2:  Pt received seated in w/c at bedside and agreeable to PT  session. Pt without verbal complaints of pain during session. Pt requires SBA with sit to stand and ambulation to main gym. Pt propelled NuStep 2 x 5 minutes at level 5 speed with LE's to increase activity tolerance. Pt transitioned to UE strengthening and performed seated block push up's. Pt ambulated to room SBA and left seated in w/c at bedside with all needs in reach.    Therapy/Group: Individual Therapy  Verl Dicker Verl Dicker PT, DPT  10/20/2022, 7:47 AM

## 2022-10-21 LAB — GLUCOSE, CAPILLARY
Glucose-Capillary: 77 mg/dL (ref 70–99)
Glucose-Capillary: 75 mg/dL (ref 70–99)
Glucose-Capillary: 178 mg/dL — ABNORMAL HIGH (ref 70–99)
Glucose-Capillary: 198 mg/dL — ABNORMAL HIGH (ref 70–99)
Glucose-Capillary: 220 mg/dL — ABNORMAL HIGH (ref 70–99)

## 2022-10-21 MED ORDER — METFORMIN HCL 500 MG PO TABS
500.0000 mg | ORAL_TABLET | Freq: Every day | ORAL | Status: DC
  Administered 2022-10-22 – 2022-10-27 (×6): 500 mg via ORAL
  Filled 2022-10-21 (×6): qty 1

## 2022-10-21 MED ORDER — CALCIUM CARBONATE 1250 (500 CA) MG PO TABS
1.0000 | ORAL_TABLET | Freq: Every day | ORAL | Status: DC
  Administered 2022-10-22 – 2022-10-27 (×6): 1250 mg via ORAL
  Filled 2022-10-21 (×6): qty 1

## 2022-10-21 NOTE — Progress Notes (Signed)
Physical Therapy Session Note  Patient Details  Name: Janice Short MRN: [id number] Date of Birth: [birth year]  Today's Date: 10/21/2022 PT Individual Time: 2919-1660 PT Individual Time Calculation (min): 72 min   Short Term Goals: Week 2:  PT Short Term Goal 1 (Week 2): Pt will perform sit to stand with CGA and LRAD PT Short Term Goal 2 (Week 2): Pt will require CGA for dynamic standing balance PT Short Term Goal 3 (Week 2): Pt will require CGA for bed to chair transfer  Skilled Therapeutic Interventions/Progress Updates:      Therapy Documentation Precautions:  Precautions Precautions: Cervical, Fall Precaution Booklet Issued: No Precaution Comments: [place] Required Braces or Orthoses: Cervical Brace Cervical Brace: Hard collar Restrictions Weight Bearing Restrictions: No   Pt received seated edge of bed and agreeable to PT session.  Pt requests to toilet and requires CGA with sit to stand from airex bed and SBA with gait ~10 ft to bathroom. Pt continent of bowel and nursing present to inspect wound in standing in which pt requires SBA for transfer. Pt requires set-up for upper body dressing and min A for lower body dressing. Pt ambulated to dayroom with SBA with verbal cues for step length and pt able to correct. Pt reports increased low back pain and provided with hot pack for relief. Pt performed 3 x 10 gluteal bridges with 5 second holds. Pt performed modified staggered single leg gluteal bridges 1 x 8 bilaterally and requires verba cues for technique. Pt ambulated to room and left seated in w/c at bedside with all needs in reach. Pt declines safety alarm.     Therapy/Group: Individual Therapy  Verl Dicker Verl Dicker PT, DPT  10/21/2022, 7:42 AM

## 2022-10-21 NOTE — Progress Notes (Addendum)
Occupational Therapy Session Note  Patient Details  Name: Janice Short MRN: [id number] Date of Birth: 05-24-1961  Today's Date: 10/21/2022 OT Individual Time: 0918-1000 session 1 OT Individual Time Calculation (min): 42 min  Session 2: 1415-1502   Short Term Goals: Week 2:  OT Short Term Goal 1 (Week 2): Pt will complete all grooming sink side w/c level with set up only including [place] OT Short Term Goal 2 (Week 2): Pt will complete toilet and shower bench transfer with modified squat pivot transfer x 1 person with mod A OT Short Term Goal 3 (Week 2): Pt will stand with counter/RW support for 2 minutes for self care OT Short Term Goal 4 (Week 2): Pt will complete UB dressing with set up and LB dressing with min A with AE  Skilled Therapeutic Interventions/Progress Updates:   Session 1: Pt greeted seated in w/c, pt taking meds with nursing student, pt agreeable to OT intervention. Session focus on  LUE [place].  Total A transfer to gym in w/c for time mgmt. Pt completed seated LUE [place] task where pt instructed to use LUE to put together small puzzle pieces. Pt with difficulty manipulating pieces with LUE d/t impaired pincer grasp in LUE but able to complete taks with increased time and effort with supervision and MIN cues for technique and compensatory method.   Additionally worked on [employer] with IADL task of orginainzing meds in pill box. Pt does not use pill box at home however focused more on manipulation skills needed to open bottles etc.   Additonally went over safety precautions during med mgmt such as "what would you do if you dropped a pill etc." Pt with appropriate safety awareness during task and pt able to open all bottles with only set- up.   Pt did need MIN verbal cues for pill bottles labeled " every other day" as pt disoriented on set up of days of week, once pt orieneted to days of week going left>right pt able to safely correct mistake.              Pt completed functional  ambulation back to room with rw and CGA. Ended session with pt seated in w/c with all needs within reach.        Session 2: pt greeted seated in w/c, pt agreeable to OT intervention. Pt transported to gym with total A. Session focused on LUE FMC, dynamic standing balance and standing tolerance for ADL participation.   Pt able to stand for 3 mins with CGA with unilateral support to engage in connect 4 game with an emphasis on manipulating pieces with LUE. Pt needed MIN verbal cues for correct posture as pt tends to lean forward.   Pt also completed seated [place] task where pt instructed to recreate same shape structure from visual aid provided with an emphasis on using L hand to manipulate shapes. Noted pt with decreased ability to isolate digits but with cues pt able to complete pincer grasp in order to create structure with improved accuracy.   Pt also engaged in seated yahtzee game with an emphasis on using pincer grasp in L hand to pick up dice and position dice on table with L hand, pt completed task with supervision with MIN cues for set- up and correct body mechanics.   Ended session with pt seated in w/c with all needs within reach.    Therapy Documentation Precautions:  Precautions Precautions: Cervical, Fall Precaution Booklet Issued: No Precaution Comments: [place] Required Braces  or Orthoses: Cervical Brace Cervical Brace: Hard collar Restrictions Weight Bearing Restrictions: No  Pain: Session 1: 6/10 pain in back, rest breaks provided as needed with session graded to pts pain tolerance Session 2: no pain    Therapy/Group: Individual Therapy  Corinne Ports [facility] 10/21/2022, 12:03 PM

## 2022-10-21 NOTE — Progress Notes (Signed)
PROGRESS NOTE   Subjective/Complaints:  Pt reports she feels like she's meeting/exceeding goals.  Concerned because had a CBG of 70s this AM- L shoulder a LOT better- esp with kpad and k-taping.   Said bottom wound is "doing better'- I.e. not hurting like it used to.   ROS:    Pt denies SOB, abd pain, CP, N/V/C/D, and vision changes    Objective:   No results found. Recent Labs    10/19/22 0451  WBC 11.7*  HGB 8.2*  HCT 24.9*  PLT 124*   Recent Labs    10/19/22 0451  NA 136  K 4.2  CL 107  CO2 21*  GLUCOSE 108*  BUN 11  CREATININE 1.44*  CALCIUM 7.6*    Intake/Output Summary (Last 24 hours) at 10/21/2022 3244 Last data filed at 10/21/2022 0700 Gross per 24 hour  Intake 720 ml  Output 600 ml  Net 120 ml     Pressure Injury 10/06/22 Buttocks Right Stage 3 -  Full thickness tissue loss. Subcutaneous fat may be visible but bone, tendon or muscle are NOT exposed. see picture in Epic (Active)  10/06/22 1130  Location: Buttocks  Location Orientation: Right  Staging: Stage 3 -  Full thickness tissue loss. Subcutaneous fat may be visible but bone, tendon or muscle are NOT exposed.  Wound Description (Comments): see picture in Epic  Present on Admission: Yes    Physical Exam: Vital Signs Blood pressure 112/61, pulse 84, temperature 98.4 F (36.9 C), temperature source Oral, resp. rate 16, height 5\' 7"  (1.702 m), weight 82.4 kg, SpO2 98 %.      General: awake, alert, appropriate, sitting EOB, finished tray 100%; NAD HENT: conjugate gaze; oropharynx moist- R cheek almost healed; Neck anterior incision almost healed- glued CV: regular rate; no JVD Pulmonary: CTA B/L; no W/R/R- good air movement GI: soft, NT, ND, (+)BS Psychiatric: appropriate Neurological: Ox3 Musculoskeletal L trap much less tight- K tape in place on L shoulder, however tape coming off in 1 place    Comments: RUE- Bicep 4+/5;  triceps 4/5; WE 4-/5; grip 4-/5; FA 4-/5 LUE- Biceps 4-/5; WE 3-/5; Triceps 2/5; Grip 2-/5 and FA 1/5- no real change today on exam RLE- HF, KE 4/5; DF/PF and EHL 4+/5 LLE- DF/PF 4/5 and EHL 4-/5- pt unable to let me uncover her again, she's so cold to test rest of LLE  Skin:    General: Skin is warm and dry.     Comments: Skin laceration to left facial cheek with stitches in place.  ACDF site clean and dry Stage III- superficial stage III 1.3 x 1.0 x 0.5 cm on coccyx with a few tiny spots that are more superficial around it   Neurological:     Mental Status: She is alert.     Comments: Patient is alert and oriented x3.  Follows full commands. Ox3 No abnl tone or clonus RLE/LLE Assessment/Plan: 1. Functional deficits which require 3+ hours per day of interdisciplinary therapy in a comprehensive inpatient rehab setting. Physiatrist is providing close team supervision and 24 hour management of active medical problems listed below. Physiatrist and rehab team continue to assess barriers to discharge/monitor  patient progress toward functional and medical goals  Care Tool:  Bathing    Body parts bathed by patient: Chest, Front perineal area, Abdomen, Face, Left arm, Right upper leg, Left upper leg   Body parts bathed by helper: Right arm, Right lower leg, Left lower leg, Buttocks     Bathing assist Assist Level: Moderate Assistance - Patient 50 - 74%     Upper Body Dressing/Undressing Upper body dressing   What is the patient wearing?: Pull over shirt    Upper body assist Assist Level: Moderate Assistance - Patient 50 - 74%    Lower Body Dressing/Undressing Lower body dressing      What is the patient wearing?: Underwear/pull up, Incontinence brief     Lower body assist Assist for lower body dressing: Total Assistance - Patient < 25%     Toileting Toileting    Toileting assist Assist for toileting: 2 Helpers     Transfers Chair/bed transfer  Transfers assist      Chair/bed transfer assist level: 2 Helpers     Locomotion Ambulation   Ambulation assist   Ambulation activity did not occur: Safety/medical concerns          Walk 10 feet activity   Assist  Walk 10 feet activity did not occur: Safety/medical concerns        Walk 50 feet activity   Assist Walk 50 feet with 2 turns activity did not occur: Safety/medical concerns         Walk 150 feet activity   Assist Walk 150 feet activity did not occur: Safety/medical concerns         Walk 10 feet on uneven surface  activity   Assist Walk 10 feet on uneven surfaces activity did not occur: Safety/medical concerns         Wheelchair     Assist Is the patient using a wheelchair?: Yes Type of Wheelchair: Manual    Wheelchair assist level: Dependent - Patient 0%      Wheelchair 50 feet with 2 turns activity    Assist        Assist Level: Dependent - Patient 0%   Wheelchair 150 feet activity     Assist      Assist Level: Dependent - Patient 0%   Blood pressure 112/61, pulse 84, temperature 98.4 F (36.9 C), temperature source Oral, resp. rate 16, height 5\' 7"  (1.702 m), weight 82.4 kg, SpO2 98 %.  Medical Problem List and Plan: 1. 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  Status post ACDF C4-5 10/02/2022 per Dr.Yarbrough.  Cervical collar when out of bed applied in sitting position             -patient may  shower- monitor skin             -ELOS/Goals: 3-4 weeks- min A hopefully  D/c 11/22  Con't CIR- PT and OT -DVT/anticoagulation:  Pharmaceutical: Lovenox.   Venous Doppler studies 10/07/2022 negative  11/10- saw getting lovenox BID - will make 40 mg daily.              -antiplatelet therapy: N/A 3. Pain Management: Neurontin 300 mg twice daily, Robaxin and oxycodone  as needed  11/3- pain 8-9/10 this Am, but was due for pain meds- con't regimen for now- will increase to 600 mg BID but no higher due to some  elevation/Cr of 1.4-1.5  11/6- pain controlled- con't regimen  11/7- will add voltaren gel 2G QID for hands/feet  11/9- pain controlled with meds- con't regimen  11/14- pain in L shoulder- is myofascial- will schedule robaxin 1000 mg QID  11/15- with taping, Kpad, and Robxin, shoulder pain resolved 4. Mood/Behavior/Sleep: Lexapro 25 mg daily             -antipsychotic agents: N/A 5. Neuropsych/cognition: This patient is capable of making decisions on her own behalf. 6. Skin/Wound Care left facial cheek laceration: Status post sutures received in ED.  Routine skin checks 7. Fluids/Electrolytes/Nutrition:   -will adv to regular diet. She can make choices as to what she can chew or not 8.  Diabetes mellitus.  Hemoglobin A1c 5.9.  Currently maintained on Semglee 8 units daily.  Patient on Tresiba 8 units daily prior to admission  11/6- CBG's labile- 96-222-  11/10- CBG's 78- 193= with 1 value of 215- but otherwise pretty well controlled- will monitor 11/15- CBG down to 75 this AM, but up to 199 yesterday late AM- will add Metformin 500 mg q breakfast- due to Cr, don't want too high of dose- will check with pt, was she on insulin at home? 9.  AKI on CKD stage III.  Baseline creatinine 1.18-1.26.  Follow-up chemistries  11/3-4- Cr up to 1.41- will recheck Monday and push fluids  11/8- Cr 1.35- but has been hanging around 1.2 to 1.4-   11/14- Cr slightly more at 1.44- will push fluids- because BUN 11 10.  Acute on chronic anemia.  Follow-up CBC 11.  Intrahepatic cholangiocarcinoma.  Patient on chemotherapy followed by Dr. Randa Evens.  Follow-up outpatient- port not accessed- missed last chemo treatment-her car broke down- will need to be here 3-4 weeks.   11/3- supposedly got chemo q2 weeks- will need to check with Oncology what to do while here. D/w  PA  11/6- they will restart once leaves rehab 12.  Obesity.  BMI 27.62.  Dietary follow-up 13.  Hypertension.  Norvasc 2.5 mg daily.  Monitor with increased mobility 14.  History of tobacco use/COPD.  Continue inhalers as indicated 15.  Hyperlipidemia.  Lipitor 16. Complicated UTI.Empiric IV Rocephin 10/08/2022 changed to keflex for 7 days- pending culture, hopefully- will monitor  11/6- ON Keflex for Proteus- is sensitive-  17. Neurogenic bladder- will do bladder scans q6 hours and cath if volumes >350cc  11/14- hasn't required caths.  18. Neurogenic bowel with constipation- will get cleaned out- Sorbitol 45cc now and follow with SSE if no results- might need bowel program- not clear yet.   11/3- cleaned out- was continent  11/7- last night, required a full disimpaction- has hemorrhoids- will treat- will also change Senna to qAM and add Miralax daily.   11/9- will hold miralax/make prn- since had bowel accidents- due to loose stools- con't senna so doesn't get backed up again. Since on pain meds  11/10- having BM on toilet- feeling better about lack of accidents today 11/12 type 6 BM  11/14- said going- but a little soft- will decrease Senna to 1 tab and con't miralax.  19. Stage III coccyx pressure ulcer- on admission- will automatically get WOC consult and determine if needs air mattress, place foam for the moment and turn often- will order turning q2 hours  11/3- will make sure she's on ROHO- seen by WOC- started aquacel with foam dressing to change daily.   11/15- per nursing, is healing- cannot assess today since sitting/eating breakfast  20. Shivering/cold- will order kpad to warm her up and to help back pain.   11/3- resolved 21. Leukocytosis-  11/6- no fever- WBC 19.6- up from 14k-    11/8- WBC back down to her "baseline" of 14.2- 11/14- WBC down to 11.7- doing better-   22. Hemorrhoids  11/7- adding Anusol supp after dinner at 6pm daily.  23. Hypocalcemia  11/8- will  monitor  11/14- Ca 7.6- will add Ca carbonate  I spent a total of 35   minutes on total care today- >50% coordination of care- due to d/w pt about DM and CBGs    LOS: 13 days A FACE TO FACE EVALUATION WAS PERFORMED  Avontae Burkhead 10/21/2022, 8:22 AM

## 2022-10-21 NOTE — Progress Notes (Signed)
Recreational Therapy Session Note  Patient Details  Name: Janice Short MRN: [id number] Date of Birth: 1961-02-26 Today's Date: 10/21/2022  Pain: no c/o Skilled Therapeutic Interventions/Progress Updates:  Goal:  Pt will use LUE for fine motor/coordination tasks with supervision/min cues.  MET  Session focused on UE use during co-treat with OT.  Pt sat w/c level playing Yhatzee with a focus LUE  gross & fine motor use Pt grasping Yhatzee cup, dumping out the dice and manipulating the dice individually for game play with min cues to prevent shoulder hiking/compensatory strategies.  Pt smiling and laughing throughout this session.  Therapy/Group: Co-Treatment   Eulogio Requena 10/21/2022, 3:19 PM

## 2022-10-21 NOTE — Progress Notes (Signed)
Physical Therapy Session Note  Patient Details  Name: Janice Short MRN: [id number] Date of Birth: Feb 17, 1961  Today's Date: 10/21/2022 PT Individual Time: 1050-1135 PT Individual Time Calculation (min): 45 min   Short Term Goals: Week 2:  PT Short Term Goal 1 (Week 2): Pt will perform sit to stand with CGA and LRAD PT Short Term Goal 2 (Week 2): Pt will require CGA for dynamic standing balance PT Short Term Goal 3 (Week 2): Pt will require CGA for bed to chair transfer  Skilled Therapeutic Interventions/Progress Updates:     Patient in w/c with c-collar donned in the room upon PT arrival. Patient alert and agreeable to PT session, adjusted PT schedule and patient agreeable to starting session early. Patient denied pain during session, reports increased fatigue from walking in earlier PT session.   Discussed homes set-up, PT goals and ELOS. Patient reports that she sits on her bed most of the day, discussed need for increased back support in sitting for pain management. Patient reports she does have a chair in her room she could sit in. Discussed circumstances of fall resulting in present injury, patient reports walking at night in the dark and stepping on something in the floor. Educated patient on fall risk/prevention, home modifications to prevent falls, including use of night lights to illuminate walkways at night, and activation of emergency services in the event of a fall during session. Patient very receptive to education. Patient states she has not had a vision exam in several years, encouraged her to start setting-up annual vision exams to ensure vision is not a component of fall risk, patient in agreement.   Therapeutic Activity: Transfers: Patient performed sit to/from stand x2 with CGA-close supervision using RW. Provided verbal cues for scooting forward and reaching back to control descent, uncontrolled descent on first trial despite cues. Patient stood >1 min with supervision as RN  assessed and dressed her sacral wound  Gait Training:  Patient ambulated 104 feet and 44 feet using RW with CGA-close supervision. Ambulated with decreased gait speed, decreased step length and height, decreased lateral hip activation, increased elbow flexion, significant forward trunk lean, and downward head gaze. Provided verbal cues for erect posture, increased triceps activation for improved upper extremity support, increased step height for safety, and lateral hip activation on stance limb. Dropped patient's walker height down during session with mild improvement in elbow and trunk extension.   Patient in w/c in the room at end of session with breaks locked and all needs within reach.   Therapy Documentation Precautions:  Precautions Precautions: Cervical, Fall Precaution Booklet Issued: No Precaution Comments: Prospect Required Braces or Orthoses: Cervical Brace Cervical Brace: Hard collar Restrictions Weight Bearing Restrictions: No    Therapy/Group: Individual Therapy  Momen Ham L Janice Short PT, DPT, NCS, CBIS  10/21/2022, 4:09 PM

## 2022-10-22 LAB — GLUCOSE, CAPILLARY
Glucose-Capillary: 123 mg/dL — ABNORMAL HIGH (ref 70–99)
Glucose-Capillary: 202 mg/dL — ABNORMAL HIGH (ref 70–99)
Glucose-Capillary: 270 mg/dL — ABNORMAL HIGH (ref 70–99)
Glucose-Capillary: 240 mg/dL — ABNORMAL HIGH (ref 70–99)

## 2022-10-22 MED ORDER — INSULIN GLARGINE-YFGN 100 UNIT/ML ~~LOC~~ SOLN
11.0000 [IU] | Freq: Every day | SUBCUTANEOUS | Status: DC
  Administered 2022-10-22 – 2022-10-27 (×6): 11 [IU] via SUBCUTANEOUS
  Filled 2022-10-22 (×7): qty 0.11

## 2022-10-22 NOTE — Progress Notes (Signed)
PROGRESS NOTE   Subjective/Complaints:  Took 20 units of insulin pen at home- said she had taken Metformin before, but doesn't remember why not on it anymore.   They took tape off shoulder this AM- but L shoulder is doing great.  ROS:   Pt denies SOB, abd pain, CP, N/V/C/D, and vision changes    Objective:   No results found. No results for input(s): "WBC", "HGB", "HCT", "PLT" in the last 72 hours.  No results for input(s): "NA", "K", "CL", "CO2", "GLUCOSE", "BUN", "CREATININE", "CALCIUM" in the last 72 hours.   Intake/Output Summary (Last 24 hours) at 10/22/2022 0947 Last data filed at 10/22/2022 0740 Gross per 24 hour  Intake 660 ml  Output 250 ml  Net 410 ml     Pressure Injury 10/06/22 Buttocks Right Stage 3 -  Full thickness tissue loss. Subcutaneous fat may be visible but bone, tendon or muscle are NOT exposed. see picture in Epic (Active)  10/06/22 1130  Location: Buttocks  Location Orientation: Right  Staging: Stage 3 -  Full thickness tissue loss. Subcutaneous fat may be visible but bone, tendon or muscle are NOT exposed.  Wound Description (Comments): see picture in Epic  Present on Admission: Yes    Physical Exam: Vital Signs Blood pressure (!) 109/53, pulse 83, temperature 98.1 F (36.7 C), temperature source Oral, resp. rate 16, height 5\' 7"  (1.702 m), weight 82.4 kg, SpO2 99 %.       General: awake, alert, appropriate, PT in room; helping pt get dressed; at sink; in w/c; NAD HENT: conjugate gaze; oropharynx moist CV: regular rate; no JVD Pulmonary: CTA B/L; no W/R/R- good air movement GI: soft, NT, ND, (+)BS Psychiatric: appropriate Neurological: Ox3  Musculoskeletal L trap much less tight- K tape in place on L shoulder, however tape coming off in 1 place    Comments: RUE- Bicep 4+/5; triceps 4/5; WE 4-/5; grip 4-/5; FA 4-/5 LUE- Biceps 4-/5; WE 3-/5; Triceps 2/5; Grip 2-/5 and FA 1/5- no  real change today on exam RLE- HF, KE 4/5; DF/PF and EHL 4+/5 LLE- DF/PF 4/5 and EHL 4-/5- pt unable to let me uncover her again, she's so cold to test rest of LLE  Skin:    General: Skin is warm and dry.     Comments: Skin laceration to left facial cheek with stitches in place.  ACDF site clean and dry Stage III- superficial stage III 1.3 x 1.0 x 0.5 cm on coccyx with a few tiny spots that are more superficial around it   Neurological:     Mental Status: She is alert.     Comments: Patient is alert and oriented x3.  Follows full commands. Ox3 No abnl tone or clonus RLE/LLE Assessment/Plan: 1. Functional deficits which require 3+ hours per day of interdisciplinary therapy in a comprehensive inpatient rehab setting. Physiatrist is providing close team supervision and 24 hour management of active medical problems listed below. Physiatrist and rehab team continue to assess barriers to discharge/monitor patient progress toward functional and medical goals  Care Tool:  Bathing    Body parts bathed by patient: Chest, Front perineal area, Abdomen, Face, Left arm, Right upper leg,  Left upper leg   Body parts bathed by helper: Right arm, Right lower leg, Left lower leg, Buttocks     Bathing assist Assist Level: Moderate Assistance - Patient 50 - 74%     Upper Body Dressing/Undressing Upper body dressing   What is the patient wearing?: Pull over shirt    Upper body assist Assist Level: Moderate Assistance - Patient 50 - 74%    Lower Body Dressing/Undressing Lower body dressing      What is the patient wearing?: Underwear/pull up, Incontinence brief     Lower body assist Assist for lower body dressing: Total Assistance - Patient < 25%     Toileting Toileting    Toileting assist Assist for toileting: 2 Helpers     Transfers Chair/bed transfer  Transfers assist     Chair/bed transfer assist level: 2 Helpers     Locomotion Ambulation   Ambulation assist    Ambulation activity did not occur: Safety/medical concerns          Walk 10 feet activity   Assist  Walk 10 feet activity did not occur: Safety/medical concerns        Walk 50 feet activity   Assist Walk 50 feet with 2 turns activity did not occur: Safety/medical concerns         Walk 150 feet activity   Assist Walk 150 feet activity did not occur: Safety/medical concerns         Walk 10 feet on uneven surface  activity   Assist Walk 10 feet on uneven surfaces activity did not occur: Safety/medical concerns         Wheelchair     Assist Is the patient using a wheelchair?: Yes Type of Wheelchair: Manual    Wheelchair assist level: Dependent - Patient 0%      Wheelchair 50 feet with 2 turns activity    Assist        Assist Level: Dependent - Patient 0%   Wheelchair 150 feet activity     Assist      Assist Level: Dependent - Patient 0%   Blood pressure (!) 109/53, pulse 83, temperature 98.1 F (36.7 C), temperature source Oral, resp. rate 16, height 5\' 7"  (1.702 m), weight 82.4 kg, SpO2 99 %.  Medical Problem List and Plan: 1. 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  Status post ACDF C4-5 10/02/2022 per Dr.Yarbrough.  Cervical collar when out of bed applied in sitting position             -patient may  shower- monitor skin             -ELOS/Goals: 3-4 weeks- min A hopefully  D/c 11/22  Con't CIR- PT and OT -DVT/anticoagulation:  Pharmaceutical: Lovenox.   Venous Doppler studies 10/07/2022 negative  11/10- saw getting lovenox BID - will make 40 mg daily.              -antiplatelet therapy: N/A 3. Pain Management: Neurontin 300 mg twice daily, Robaxin and oxycodone as needed  11/3- pain 8-9/10 this Am, but was due for pain meds- con't regimen for now- will  increase to 600 mg BID but no higher due to some elevation/Cr of 1.4-1.5  11/6- pain controlled- con't regimen  11/7- will add voltaren gel 2G QID for hands/feet  11/9- pain controlled with meds- con't regimen  11/14- pain  in L shoulder- is myofascial- will schedule robaxin 1000 mg QID  11/15- with taping, Kpad, and Robxin, shoulder pain resolved  11/16- tape off, but pain controlled 4. Mood/Behavior/Sleep: Lexapro 25 mg daily             -antipsychotic agents: N/A 5. Neuropsych/cognition: This patient is capable of making decisions on her own behalf. 6. Skin/Wound Care left facial cheek laceration: Status post sutures received in ED.  Routine skin checks 7. Fluids/Electrolytes/Nutrition:   -will adv to regular diet. She can make choices as to what she can chew or not 8.  Diabetes mellitus.  Hemoglobin A1c 5.9.  Currently maintained on Semglee 8 units daily.  Patient on Tresiba 8 units daily prior to admission  11/6- CBG's labile- 96-222-  11/10- CBG's 78- 193= with 1 value of 215- but otherwise pretty well controlled- will monitor 11/15- CBG down to 75 this AM, but up to 199 yesterday late AM- will add Metformin 500 mg q breakfast- due to Cr, don't want too high of dose- will check with pt, was she on insulin at home?  11/16- pt was on insulin pen 20 units at home- on 8 units here and Metformin- will increase insulin to 11 units- and will monitor 9.  AKI on CKD stage III.  Baseline creatinine 1.18-1.26.  Follow-up chemistries  11/3-4- Cr up to 1.41- will recheck Monday and push fluids  11/8- Cr 1.35- but has been hanging around 1.2 to 1.4-   11/14- Cr slightly more at 1.44- will push fluids- because BUN 11 10.  Acute on chronic anemia.  Follow-up CBC 11.  Intrahepatic cholangiocarcinoma.  Patient on chemotherapy followed by Dr. Randa Evens.  Follow-up outpatient- port not accessed- missed last chemo treatment-her car broke down- will need to be here 3-4 weeks.   11/3- supposedly got chemo q2  weeks- will need to check with Oncology what to do while here. D/w PA  11/6- they will restart once leaves rehab 12.  Obesity.  BMI 27.62.  Dietary follow-up 13.  Hypertension.  Norvasc 2.5 mg daily.  Monitor with increased mobility 14.  History of tobacco use/COPD.  Continue inhalers as indicated 15.  Hyperlipidemia.  Lipitor 16. Complicated UTI.Empiric IV Rocephin 10/08/2022 changed to keflex for 7 days- pending culture, hopefully- will monitor  11/6- ON Keflex for Proteus- is sensitive-  17. Neurogenic bladder- will do bladder scans q6 hours and cath if volumes >350cc  11/14- hasn't required caths.  18. Neurogenic bowel with constipation- will get cleaned out- Sorbitol 45cc now and follow with SSE if no results- might need bowel program- not clear yet.   11/3- cleaned out- was continent  11/7- last night, required a full disimpaction- has hemorrhoids- will treat- will also change Senna to qAM and add Miralax daily.   11/9- will hold miralax/make prn- since had bowel accidents- due to loose stools- con't senna so doesn't get backed up again. Since on pain meds  11/10- having BM on toilet- feeling better about lack of accidents today 11/12 type 6 BM  11/14- said going- but a little soft- will decrease Senna to 1 tab and con't miralax.  19. Stage III coccyx pressure ulcer- on admission- will automatically get WOC consult and determine if needs air mattress, place foam for the moment and turn often- will order turning q2 hours  11/3- will make sure she's on ROHO- seen by WOC- started aquacel with foam dressing to change daily.   11/15- per nursing, is healing- cannot assess today  since sitting/eating breakfast 20. Shivering/cold- will order kpad to warm her up and to help back pain.   11/3- resolved 21. Leukocytosis-  11/6- no fever- WBC 19.6- up from 14k-    11/8- WBC back down to her "baseline" of 14.2- 11/14- WBC down to 11.7- doing better-   22. Hemorrhoids  11/7- adding Anusol supp  after dinner at 6pm daily.  23. Hypocalcemia  11/8- will monitor  11/14- Ca 7.6- will add Ca carbonate     LOS: 14 days A FACE TO FACE EVALUATION WAS PERFORMED  Janice Short 10/22/2022, 9:47 AM

## 2022-10-22 NOTE — Progress Notes (Signed)
Physical Therapy Session Note  Patient Details  Name: Janice Short MRN: [id number] Date of Birth: 03/15/1961  Today's Date: 10/22/2022 PT Individual Time: 1527-1620   53 min   Short Term Goals: Week 1:  PT Short Term Goal 1 (Week 1): pt will perform supine to sit w/mod assist of 1 PT Short Term Goal 1 - Progress (Week 1): Met PT Short Term Goal 2 (Week 1): pt will perform bed to/from chair transfer w/LRAD w/mod assist of 1 PT Short Term Goal 2 - Progress (Week 1): Met PT Short Term Goal 3 (Week 1): Pt will request assit w/pressure relief consistently per schedule w/minimal cueing/reminders PT Short Term Goal 3 - Progress (Week 1): Met PT Short Term Goal 4 (Week 1): pt will perform sit to stand in LRAD mod assist of 2 PT Short Term Goal 4 - Progress (Week 1): Met Week 2:  PT Short Term Goal 1 (Week 2): Pt will perform sit to stand with CGA and LRAD PT Short Term Goal 2 (Week 2): Pt will require CGA for dynamic standing balance PT Short Term Goal 3 (Week 2): Pt will require CGA for bed to chair transfer Week 3:     Skilled Therapeutic Interventions/Progress Updates:   Pt received sitting in WC and agreeable to PT. Pt transported to entrance of [facility] in [place]. Sit<>stand transfers with min assist throughout session with cues for BLE position to allow anterior weight shift. Gait training over cement sidewalk 25f, 1555f and 10038fith RW and CGA. Pt performed additional gait training to weave through pilons at sidewalk x 3 with cues for posture and AD management in turns. Pt transported to gift shop. Standing tolerance/gait training through gift shop x15 minutes with 1 seated rest break. Pt performed reaching task to pick up several items from shelf with cues for Ad management and posture intermittently. Patient returned to room and [facility] and all needs met.         Therapy Documentation Precautions:  Precautions Precautions: Cervical, Fall Precaution  Booklet Issued: No Precaution Comments: [place] Braces or Orthoses: Cervical Brace Cervical Brace: Hard collar Restrictions Weight Bearing Restrictions: No  Vital Signs: Therapy Vitals Temp: 97.9 F (36.6 C) Pulse Rate: 72 Resp: 16 BP: (!) 104/54 Patient Position (if appropriate): Sitting Oxygen Therapy SpO2: 98 % O2 Device: Room Air Pain:  denies   Therapy/Group: Individual Therapy  AusLorie Phenix/16/2023, 6:00 PM

## 2022-10-22 NOTE — Progress Notes (Signed)
Physical Therapy Session Note  Patient Details  Name: Janice Short MRN: [id number] Date of Birth: 10/19/1961  Today's Date: 10/22/2022 PT Individual Time: 0802-0859 PT Individual Time Calculation (min): 57 min   Short Term Goals: Week 2:  PT Short Term Goal 1 (Week 2): Pt will perform sit to stand with CGA and LRAD PT Short Term Goal 2 (Week 2): Pt will require CGA for dynamic standing balance PT Short Term Goal 3 (Week 2): Pt will require CGA for bed to chair transfer  Skilled Therapeutic Interventions/Progress Updates:      Therapy Documentation Precautions:  Precautions Precautions: Cervical, Fall Precaution Booklet Issued: No Precaution Comments: [place] Required Braces or Orthoses: Cervical Brace Cervical Brace: Hard collar Restrictions Weight Bearing Restrictions: No  Pt received seated in w/c at sink performing self-care tasks and requires supervision for safety. Pt with unrated bilateral OA knee pain and provided with rest breaks for relief. Pt requires SBA with sit to stand and gait ~150 ft to dayroom. Pt engaged in blocked practice of sit to stand transfers with no AD 2 x 5 and pt demonstrates improved activity tolerance and balance. Pt presents with decreased eccentric control with stand<>sit and performed 2 x 8 of mini squats with tactile feedback at trunk and pelvis. Pt performed standing 4 inch step taps 1 x 8 and progressed to 6 inch step taps 1 x 8 with verbal cues for posture and use of RW for UE support. Pt ambulated to room SBA and left seated in w/c at bedside with all needs in reach.     Therapy/Group: Individual Therapy  Verl Dicker Verl Dicker PT, DPT  10/22/2022, 7:45 AM

## 2022-10-22 NOTE — Progress Notes (Signed)
Physical Therapy Session Note  Patient Details  Name: Janice Short MRN: [id number] Date of Birth: 1961-06-17  Today's Date: 10/22/2022 PT Individual Time: 1120-1200 PT Individual Time Calculation (min): 40 min   Short Term Goals: Week 2:  PT Short Term Goal 1 (Week 2): Pt will perform sit to stand with CGA and LRAD PT Short Term Goal 2 (Week 2): Pt will require CGA for dynamic standing balance PT Short Term Goal 3 (Week 2): Pt will require CGA for bed to chair transfer  Skilled Therapeutic Interventions/Progress Updates:    Pt seated in w/c on arrival and agreeable to therapy. No complaint of pain. Pt c/o nausea and recent emesis, but agreeable to in room therapy. Pt performed seated exercise seated marches and LAQ for activity tolerance and endurance. Pt then reports improving nausea and agreeable to standing activity. Pt participated in in room gait for endurance and dynamic balance. Pt required CGA throughout and occ cues for RW and safety. Pt remained in w/c after session and was left with all needs in reach and alarm active.   Therapy Documentation Precautions:  Precautions Precautions: Cervical, Fall Precaution Booklet Issued: No Precaution Comments: [place] Required Braces or Orthoses: Cervical Brace Cervical Brace: Hard collar Restrictions Weight Bearing Restrictions: No General:       Therapy/Group: Individual Therapy  Mickel Fuchs 10/22/2022, 12:07 PM

## 2022-10-22 NOTE — Progress Notes (Signed)
Occupational Therapy Session Note  Patient Details  Name: Janice Short MRN: [id number] Date of Birth: [birth year]  Today's Date: 10/22/2022 OT Individual Time: 1015-1115 1st Session; 1300-1330 2nd Session OT Individual Time Calculation (min): 60 min, 30 min    Short Term Goals: Week 2:  OT Short Term Goal 1 (Week 2): Pt will complete all grooming sink side w/c level with set up only including [place] OT Short Term Goal 2 (Week 2): Pt will complete toilet and shower bench transfer with modified squat pivot transfer x 1 person with mod A OT Short Term Goal 3 (Week 2): Pt will stand with counter/RW support for 2 minutes for self care OT Short Term Goal 4 (Week 2): Pt will complete UB dressing with set up and LB dressing with min A with AE  Skilled Therapeutic Interventions/Progress Updates:  1st Session:  Pt already bathed and dressed upon OT arrival this am. Pt reported she would like to be re-taped with Ktape on L scap and RTC region with reports of + pain relief and joint support. OT applied SaeboStim One to L shoulder for 10 minutes during R UE light ROM and moist heat application. See parameters below. Applied moist heat to B hands while OT retaped L scap/RTC. Pt then tolerated 5 min moist heat to L sh. Pt requested amb to bathroom with CGA for 15 ft and began to vomit. NT called and pt left in care of NT.   SaeboStim One:   no adverse skin reactions.  330 pulse width 35 Hz pulse rate On 8 sec/ off 8 sec Ramp up/ down 2 sec Symmetrical Biphasic wave form  Max intensity 14mA at 500 Ohm load  2nd Session:   Pt in w/c upon OT arrival agreeable to therapy and feeling nausea passed. Pt transported in w/c for time management to demo apt space for RW access training and standing tolerance and balance at kitchen counter. Pt able to amb with RW 20 ft x 2 with CGA then standing for 2 intervals of 2.5 minutes for functional UE reach with R UE then pass items to L UE with ~ 3-4 " spans. Seated  rests required between standing intervals. Left pt back up in w/c once returned to room with chair alarm active, nurse call button and needs in reach.    Therapy Documentation Precautions:  Precautions Precautions: Cervical, Fall Precaution Booklet Issued: No Precaution Comments: [place] Required Braces or Orthoses: Cervical Brace Cervical Brace: Hard collar Restrictions Weight Bearing Restrictions: No    Therapy/Group: Individual Therapy  Barnabas Lister 10/22/2022, 7:50 AM

## 2022-10-23 ENCOUNTER — Ambulatory Visit: Payer: Medicare Other

## 2022-10-23 ENCOUNTER — Other Ambulatory Visit: Payer: Medicare Other

## 2022-10-23 LAB — GLUCOSE, CAPILLARY
Glucose-Capillary: 82 mg/dL (ref 70–99)
Glucose-Capillary: 259 mg/dL — ABNORMAL HIGH (ref 70–99)
Glucose-Capillary: 153 mg/dL — ABNORMAL HIGH (ref 70–99)
Glucose-Capillary: 170 mg/dL — ABNORMAL HIGH (ref 70–99)

## 2022-10-23 MED ORDER — HYDROCORTISONE 1 % EX CREA
TOPICAL_CREAM | Freq: Two times a day (BID) | CUTANEOUS | Status: DC
  Filled 2022-10-23: qty 28

## 2022-10-23 NOTE — Progress Notes (Signed)
Physical Therapy Session Note  Patient Details  Name: Janice Short MRN: [id number] Date of Birth: Sep 24, 1961  Today's Date: 10/23/2022 PT Individual Time: 0850-1000 PT Individual Time Calculation (min): 70 min   Short Term Goals:  Week 2:  PT Short Term Goal 1 (Week 2): Pt will perform sit to stand with CGA and LRAD PT Short Term Goal 2 (Week 2): Pt will require CGA for dynamic standing balance PT Short Term Goal 3 (Week 2): Pt will require CGA for bed to chair transfer   Skilled Therapeutic Interventions/Progress Updates:   Pt received sitting EOB and agreeable to PT. Pt perform lower body dressing EOB to don pants. Sit<>stand transfers performed with supervision assist throughout session x 12 with cues for BLE position and to push from sitting surface through BUE. Pt able to pull pants to waist with mod assist for safety and management pants due to UE weakness.   Stand pivot transfer to [facility] with RW and supervision assist. Performed oral and hair care sitting in [facility] with set up assist from PT and assist with tooth paste management.   Pt transported to Rehab gym in [facility]. Gait training performed with RW through hall x 180f and 1451fwith supervision  assist-CGA with min cues for posture throughout.  Dynamic gait training perfoward/retroversion x 1287fith RW and CGA. Cues for posture and step length in reverse initially.   Dynamic balance/ standing tolerance while engaged in Wii bowling performed 2 frames sitting and 8 frames in standing with CGA for balance and min cues for management of controls with the RUE. No LOB noted throughout.  Additional standing balance performed from airex pad to perform lateral reach and toss bean bag to target x 12 Bil with 1 UE support throughout and hand over hand assist from PT to improve ROM and coordination on the LUE. Pt then performed picking up bean bag from floor with reacher and CGA for safety. Min assist standing on ariex pad to prevent LOB posteriorly  x 3 throughout.   Patient returned to room and [facility] and all needs met.          Therapy Documentation Precautions:  Precautions Precautions: Cervical, Fall Precaution Booklet Issued: No Precaution Comments: [place] Braces or Orthoses: Cervical Brace Cervical Brace: Hard collar Restrictions Weight Bearing Restrictions: No  Vital Signs: Oxygen Therapy SpO2: 97 % O2 Device: Room Air Pain: Pain Assessment Pain Score: 2  General. See eMAR    Therapy/Group: Individual Therapy  AusLorie Phenix/17/2023, 10:11 AM

## 2022-10-23 NOTE — Progress Notes (Signed)
PROGRESS NOTE   Subjective/Complaints:  Pt reports doing well- sometimes aspen collar rubs and hurts when worn a long time.  Went outside yesterday and walked 200 ft with RW.  Also having itching on her back where she cannot reach well.    ROS:   Pt denies SOB, abd pain, CP, N/V/C/D, and vision changes    Objective:   No results found. No results for input(s): "WBC", "HGB", "HCT", "PLT" in the last 72 hours.  No results for input(s): "NA", "K", "CL", "CO2", "GLUCOSE", "BUN", "CREATININE", "CALCIUM" in the last 72 hours.   Intake/Output Summary (Last 24 hours) at 10/23/2022 0835 Last data filed at 10/23/2022 0500 Gross per 24 hour  Intake 330 ml  Output 200 ml  Net 130 ml     Pressure Injury 10/06/22 Buttocks Right Stage 3 -  Full thickness tissue loss. Subcutaneous fat may be visible but bone, tendon or muscle are NOT exposed. see picture in Epic (Active)  10/06/22 1130  Location: Buttocks  Location Orientation: Right  Staging: Stage 3 -  Full thickness tissue loss. Subcutaneous fat may be visible but bone, tendon or muscle are NOT exposed.  Wound Description (Comments): see picture in Epic  Present on Admission: Yes    Physical Exam: Vital Signs Blood pressure (!) 105/53, pulse 69, temperature 97.8 F (36.6 C), temperature source Oral, resp. rate 18, height 5\' 7"  (1.702 m), weight 82.4 kg, SpO2 97 %.        General: awake, alert, appropriate, sitting EOB eating breakfast; NAD HENT: conjugate gaze; oropharynx moist CV: regular rate; no JVD Pulmonary: CTA B/L; no W/R/R- good air movement GI: soft, NT, ND, (+)BS Psychiatric: appropriate Neurological: Ox3 Skin:- Has an area where had some previous acne on L rhomboids- a little excoriation- dry skin-  Musculoskeletal L trap much less tight- K tape in place on L shoulder, however tape coming off in 1 place    Comments: RUE- Bicep 4+/5; triceps 4/5; WE  4-/5; grip 4-/5; FA 4-/5 LUE- Biceps 4-/5; WE 3-/5; Triceps 2/5; Grip 2-/5 and FA 1/5- no real change today on exam RLE- HF, KE 4/5; DF/PF and EHL 4+/5 LLE- DF/PF 4/5 and EHL 4-/5- pt unable to let me uncover her again, she's so cold to test rest of LLE  Skin:    General: Skin is warm and dry.     Comments: Skin laceration to left facial cheek with stitches in place.  ACDF site clean and dry Stage III- superficial stage III 1.3 x 1.0 x 0.5 cm on coccyx with a few tiny spots that are more superficial around it   Neurological:     Mental Status: She is alert.     Comments: Patient is alert and oriented x3.  Follows full commands. Ox3 No abnl tone or clonus RLE/LLE Assessment/Plan: 1. Functional deficits which require 3+ hours per day of interdisciplinary therapy in a comprehensive inpatient rehab setting. Physiatrist is providing close team supervision and 24 hour management of active medical problems listed below. Physiatrist and rehab team continue to assess barriers to discharge/monitor patient progress toward functional and medical goals  Care Tool:  Bathing    Body parts bathed  by patient: Chest, Front perineal area, Abdomen, Face, Left arm, Right upper leg, Left upper leg   Body parts bathed by helper: Right arm, Right lower leg, Left lower leg, Buttocks     Bathing assist Assist Level: Moderate Assistance - Patient 50 - 74%     Upper Body Dressing/Undressing Upper body dressing   What is the patient wearing?: Pull over shirt    Upper body assist Assist Level: Moderate Assistance - Patient 50 - 74%    Lower Body Dressing/Undressing Lower body dressing      What is the patient wearing?: Underwear/pull up, Incontinence brief     Lower body assist Assist for lower body dressing: Total Assistance - Patient < 25%     Toileting Toileting    Toileting assist Assist for toileting: 2 Helpers     Transfers Chair/bed transfer  Transfers assist     Chair/bed  transfer assist level: 2 Helpers     Locomotion Ambulation   Ambulation assist   Ambulation activity did not occur: Safety/medical concerns          Walk 10 feet activity   Assist  Walk 10 feet activity did not occur: Safety/medical concerns        Walk 50 feet activity   Assist Walk 50 feet with 2 turns activity did not occur: Safety/medical concerns         Walk 150 feet activity   Assist Walk 150 feet activity did not occur: Safety/medical concerns         Walk 10 feet on uneven surface  activity   Assist Walk 10 feet on uneven surfaces activity did not occur: Safety/medical concerns         Wheelchair     Assist Is the patient using a wheelchair?: Yes Type of Wheelchair: Manual    Wheelchair assist level: Dependent - Patient 0%      Wheelchair 50 feet with 2 turns activity    Assist        Assist Level: Dependent - Patient 0%   Wheelchair 150 feet activity     Assist      Assist Level: Dependent - Patient 0%   Blood pressure (!) 105/53, pulse 69, temperature 97.8 F (36.6 C), temperature source Oral, resp. rate 18, height 5\' 7"  (1.702 m), weight 82.4 kg, SpO2 97 %.  Medical Problem List and Plan: 1. 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  Status post ACDF C4-5 10/02/2022 per Dr.Yarbrough.  Cervical collar when out of bed applied in sitting position             -patient may  shower- monitor skin             -ELOS/Goals: 3-4 weeks- min A hopefully  D/c 11/22  Walking well with therapy-   Con't CIR- PT and OT  -DVT/anticoagulation:  Pharmaceutical: Lovenox.   Venous Doppler studies 10/07/2022 negative  11/10- saw getting lovenox BID - will make 40 mg daily.              -antiplatelet therapy: N/A 3. Pain Management: Neurontin 300 mg twice daily,  Robaxin and oxycodone as needed  11/3- pain 8-9/10 this Am, but was due for pain meds- con't regimen for now- will increase to 600 mg BID but no higher due to some elevation/Cr of 1.4-1.5  11/6- pain controlled- con't regimen  11/7- will add voltaren gel 2G QID for hands/feet  11/9- pain controlled with meds- con't regimen  11/14- pain in L shoulder- is myofascial- will schedule robaxin 1000 mg QID  11/15- with taping, Kpad, and Robxin, shoulder pain resolved  11/16- tape off, but pain controlled 4. Mood/Behavior/Sleep: Lexapro 25 mg daily             -antipsychotic agents: N/A 5. Neuropsych/cognition: This patient is capable of making decisions on her own behalf. 6. Skin/Wound Care left facial cheek laceration: Status post sutures received in ED.  Routine skin checks 7. Fluids/Electrolytes/Nutrition:   -will adv to regular diet. She can make choices as to what she can chew or not 8.  Diabetes mellitus.  Hemoglobin A1c 5.9.  Currently maintained on Semglee 8 units daily.  Patient on Tresiba 8 units daily prior to admission  11/6- CBG's labile- 96-222-  11/10- CBG's 78- 193= with 1 value of 215- but otherwise pretty well controlled- will monitor 11/15- CBG down to 75 this AM, but up to 199 yesterday late AM- will add Metformin 500 mg q breakfast- due to Cr, don't want too high of dose- will check with pt, was she on insulin at home?  11/16- pt was on insulin pen 20 units at home- on 8 units here and Metformin- will increase insulin to 11 units- and will monitor  11/17- Sugars spiked yesterday, much higher than normal- snacks? If doesn't improve, suggest increasing Semglee over weekend 9.  AKI on CKD stage III.  Baseline creatinine 1.18-1.26.  Follow-up chemistries  11/3-4- Cr up to 1.41- will recheck Monday and push fluids  11/8- Cr 1.35- but has been hanging around 1.2 to 1.4-   11/14- Cr slightly more at 1.44- will push fluids- because BUN 11 10.  Acute on chronic anemia.  Follow-up CBC 11.   Intrahepatic cholangiocarcinoma.  Patient on chemotherapy followed by Dr. Randa Evens.  Follow-up outpatient- port not accessed- missed last chemo treatment-her car broke down- will need to be here 3-4 weeks.   11/3- supposedly got chemo q2 weeks- will need to check with Oncology what to do while here. D/w PA  11/6- they will restart once leaves rehab 12.  Obesity.  BMI 27.62.  Dietary follow-up 13.  Hypertension.  Norvasc 2.5 mg daily.  Monitor with increased mobility 14.  History of tobacco use/COPD.  Continue inhalers as indicated 15.  Hyperlipidemia.  Lipitor 16. Complicated UTI.Empiric IV Rocephin 10/08/2022 changed to keflex for 7 days- pending culture, hopefully- will monitor  11/6- ON Keflex for Proteus- is sensitive-  17. Neurogenic bladder- will do bladder scans q6 hours and cath if volumes >350cc  11/14- hasn't required caths.  18. Neurogenic bowel with constipation- will get cleaned out- Sorbitol 45cc now and follow with SSE if no results- might need bowel program- not clear yet.   11/3- cleaned out- was continent  11/7- last night, required a full disimpaction- has hemorrhoids- will treat- will also change Senna to qAM and add Miralax daily.   11/9- will hold miralax/make prn- since had bowel accidents- due to loose stools- con't senna so doesn't get backed up again. Since on pain meds  11/10- having BM on toilet- feeling better about lack of accidents today 11/12 type 6 BM  11/14- said going- but a little soft- will decrease Senna to 1 tab and con't miralax.   11/17- LBM 2 days ago- medium- if no BM by tomorrow, will need intervention 19. Stage III coccyx pressure ulcer- on admission-  will automatically get WOC consult and determine if needs air mattress, place foam for the moment and turn often- will order turning q2 hours  11/3- will make sure she's on ROHO- seen by WOC- started aquacel with foam dressing to change daily.   11/15- per nursing, is healing- cannot assess today  since sitting/eating breakfast 20. Shivering/cold- will order kpad to warm her up and to help back pain.   11/3- resolved 21. Leukocytosis-  11/6- no fever- WBC 19.6- up from 14k-    11/8- WBC back down to her "baseline" of 14.2- 11/14- WBC down to 11.7- doing better-   22. Hemorrhoids  11/7- adding Anusol supp after dinner at 6pm daily.  23. Hypocalcemia  11/8- will monitor  11/14- Ca 7.6- will add Ca carbonate 24. Itching  11/17- will add steroid cream for back BID    LOS: 15 days A FACE TO FACE EVALUATION WAS PERFORMED  Janice Short 10/23/2022, 8:35 AM

## 2022-10-23 NOTE — Progress Notes (Signed)
Physical Therapy Session Note  Patient Details  Name: Janice Short MRN: [id number] Date of Birth: 06/30/1961  Today's Date: 10/23/2022 PT Individual Time: 1650-1735 PT Individual Time Calculation (min): 45 min   Short Term Goals: Week 1:  PT Short Term Goal 1 (Week 1): pt will perform supine to sit w/mod assist of 1 PT Short Term Goal 1 - Progress (Week 1): Met PT Short Term Goal 2 (Week 1): pt will perform bed to/from chair transfer w/LRAD w/mod assist of 1 PT Short Term Goal 2 - Progress (Week 1): Met PT Short Term Goal 3 (Week 1): Pt will request assit w/pressure relief consistently per schedule w/minimal cueing/reminders PT Short Term Goal 3 - Progress (Week 1): Met PT Short Term Goal 4 (Week 1): pt will perform sit to stand in LRAD mod assist of 2 PT Short Term Goal 4 - Progress (Week 1): Met Week 2:  PT Short Term Goal 1 (Week 2): Pt will perform sit to stand with CGA and LRAD PT Short Term Goal 2 (Week 2): Pt will require CGA for dynamic standing balance PT Short Term Goal 3 (Week 2): Pt will require CGA for bed to chair transfer Week 3:     Skilled Therapeutic Interventions/Progress Updates:   Pt received sitting in WC and agreeable to PT. Pt transported to rehab gym in WC. Gait training with RW and CGA for safety x 165ft. Cues for posture and step length when fatigued.   Pt performed seated BLE therex.  Hip abduction with red tband and 3 sec hold at end range 2 x 13 Reciprocal hip flexion x 10 with red tab LAQ red tband x 10 Ankle DF AROM x 15. Ankle PF with red tband x 12  Isometric hip adduction x 15  Cues for full ROM and hold at end range.   WC mobility through hall x 150ft with supervision assist and cues for improved coordination in turns. Patient returned to room and left sitting in WC with call bell in reach and all needs met.        Therapy Documentation Precautions:  Precautions Precautions: Cervical, Fall Precaution Booklet Issued: No Precaution  Comments: [facility] Required Braces or Orthoses: Cervical Brace Cervical Brace: Hard collar Restrictions Weight Bearing Restrictions: No   Pain: denies   Therapy/Group: Individual Therapy   E  10/23/2022, 5:50 PM  

## 2022-10-23 NOTE — Progress Notes (Signed)
Occupational Therapy Session Note  Patient Details  Name: Janice Short MRN: [id number] Date of Birth: November 19, 1961  Today's Date: 10/23/2022 OT Individual Time: 1300-1335 OT Individual Time Calculation (min): 35 min    Short Term Goals: Week 3:  OT Short Term Goal 1 (Week 3): S for all transfers to toilet and shower seat with RW OT Short Term Goal 2 (Week 3): Pt will amb with RW for gathering self care items with S OT Short Term Goal 3 (Week 3): Pt will retrieve a light item from cabinet or fridge standing level with S  Skilled Therapeutic Interventions/Progress Updates:    Upon OT arrival, pt seated in w/c with nurse present and is agreeable to OT treatment. No pain reported. Treatment intervention with a focus on item retrieval, functional mobility, and self care retraining. Pt completes sit to stand transfer with CGA and ambulates within her room using RW and SBA to retrieve items using the R UE and place into her walker bag. Pt able to retrieve 6 items and return to their designated locations without a seated rest break. Pt stands at sink to comb her hair with SBA then ambulates to the bathroom with CGA to have a BM. Pt completes toilet transfer with CGA and toileting with SBA. Pt ambulates to sink to perform hand hygiene with CGA and returns to the w/c with CGA. Pt was left in w/c at end of session with all needs met.   Therapy Documentation Precautions:  Precautions Precautions: Cervical, Fall Precaution Booklet Issued: No Precaution Comments: [place] Required Braces or Orthoses: Cervical Brace Cervical Brace: Hard collar Restrictions Weight Bearing Restrictions: No   Therapy/Group: Individual Therapy  Marvetta Gibbons 10/23/2022, 1:41 PM

## 2022-10-23 NOTE — Plan of Care (Signed)
  Problem: RH SKIN INTEGRITY Goal: RH STG SKIN FREE OF INFECTION/BREAKDOWN Description: Skin will be free of any additional breakdown/infection with min assist Outcome: Progressing

## 2022-10-23 NOTE — Progress Notes (Signed)
Occupational Therapy Weekly Progress Note  Patient Details  Name: Janice Short MRN: [id number] Date of Birth: Apr 08, 1961  Beginning of progress report period: October 23, 2022 End of progress report period: October 23, 2022  Today's Date: 10/23/2022 OT Individual Time: 1000-1100 OT Individual Time Calculation (min): 60 min    Patient has met 4 of 4 short term goals. Progressed with all aspects of ADL's and mobility moving toward amb with RW with support. Pt tolerating estim and moist heat along with UE therex and [facility] tasks. Standing tolerance >2 min at sink and kitchen counter. Will initiate written UE HEP and discharge readiness.   Patient continues to demonstrate the following deficits: muscle weakness, impaired timing and sequencing, unbalanced muscle activation, and decreased coordination, and decreased standing balance, decreased postural control, and decreased balance strategies and therefore will continue to benefit from skilled OT intervention to enhance overall performance with BADL, iADL, and Reduce care partner burden.  Patient progressing toward long term goals..  Continue plan of care.  OT Short Term Goals Week 3:  OT Short Term Goal 1 (Week 3): S for all transfers to toilet and shower seat with RW OT Short Term Goal 2 (Week 3): Pt will amb with RW for gathering self care items with S OT Short Term Goal 3 (Week 3): Pt will retrieve a light item from cabinet or fridge standing level with S  Skilled Therapeutic Interventions/Progress Updates:   Pt rec'd after PT with handoff in room. 3/10 pain reported with significant improvement from 8/10 previous days. Saebo Stim One applied to L bicep region then wrist extensors (see below for parameters for 10 min each mm group attended) with moist heat to shoulders and B hands which proved excellent results yesterday to today. Collaboration with pt for new upgraded goal and d/c preparedness with functional reassessment conducted. Written  UE HEP initiated for breathing, scap/sh ROM, FMC and hand strengthening exercises. Issued and trained in RW bag use on RW for light item transport. Left pt w/c level at end of session with chair exit alarm engaged, needs and nurse call button within reach.   SaeboStim One:  applied to L bicep and L wrist extensors no adverse skin reactions.  330 pulse width 35 Hz pulse rate On 8 sec/ off 8 sec Ramp up/ down 2 sec Symmetrical Biphasic wave form  Max intensity 123m at 500 Ohm load  Therapy Documentation Precautions:  Precautions Precautions: Cervical, Fall Precaution Booklet Issued: No Precaution Comments: [place]Required Braces or Orthoses: Cervical Brace Cervical Brace: Hard collar Restrictions Weight Bearing Restrictions: No    Therapy/Group: Individual Therapy  EBarnabas Lister11/17/2023, 7:51 AM

## 2022-10-24 DIAGNOSIS — M79641 Pain in right hand: Secondary | ICD-10-CM

## 2022-10-24 DIAGNOSIS — K592 Neurogenic bowel, not elsewhere classified: Secondary | ICD-10-CM

## 2022-10-24 DIAGNOSIS — Z794 Long term (current) use of insulin: Secondary | ICD-10-CM

## 2022-10-24 DIAGNOSIS — I10 Essential (primary) hypertension: Secondary | ICD-10-CM

## 2022-10-24 DIAGNOSIS — M79642 Pain in left hand: Secondary | ICD-10-CM

## 2022-10-24 DIAGNOSIS — E1165 Type 2 diabetes mellitus with hyperglycemia: Secondary | ICD-10-CM

## 2022-10-24 LAB — GLUCOSE, CAPILLARY
Glucose-Capillary: 98 mg/dL (ref 70–99)
Glucose-Capillary: 239 mg/dL — ABNORMAL HIGH (ref 70–99)
Glucose-Capillary: 139 mg/dL — ABNORMAL HIGH (ref 70–99)
Glucose-Capillary: 171 mg/dL — ABNORMAL HIGH (ref 70–99)

## 2022-10-24 MED ORDER — INSULIN ASPART 100 UNIT/ML IJ SOLN
2.0000 [IU] | Freq: Three times a day (TID) | INTRAMUSCULAR | Status: DC
  Administered 2022-10-25 – 2022-10-27 (×7): 2 [IU] via SUBCUTANEOUS

## 2022-10-24 NOTE — Progress Notes (Signed)
Occupational Therapy Session Note  Patient Details  Name: Janice Short MRN: [id number] Date of Birth: [birth year]  Today's Date: 10/24/2022 OT Individual Time: 1st Session: 1115-1200, 2nd session 1500-1530 OT Individual Time Calculation (min): 45 min, 30 min    Short Term Goals: Week 3:  OT Short Term Goal 1 (Week 3): S for all transfers to toilet and shower seat with RW OT Short Term Goal 2 (Week 3): Pt will amb with RW for gathering self care items with S OT Short Term Goal 3 (Week 3): Pt will retrieve a light item from cabinet or fridge standing level with S  Skilled Therapeutic Interventions/Progress Updates:   1st Session:  Pt up in w/c upon OT arrival. Pt points out her B LE's from mid-calf to toes has increased edema overall +2. OT worked on edema mngt this session with switching out B standard leg rests for elevating leg rests. Pt agreeable to knee high TED hose and OT applied. Pt integrated demo reacher to doff slipper socks and required cues, S and increased time and effort for bariatric sized slipper socks donning with sock aide. Removed ktape on L deltoid due to reports of itching. Educated in tputty, foam block squeezes and ankle pumps for edema mngt. OT care coord with nursing and NT re: measures for edema control. Left pt with LE's elevated, chair exit alarm engaged and nurse call button and needs in reach.   2nd Session:   Pt up in w/c upon OT arrival for short scheduled session. Pt reported + results thus far with B LE edema control measures. Pt agreeable to functional amb with shopping cart to address activity tolerance, balance and functional access for simple simulated grocery shopping. UE graded control focus as well. Pt transported to main gym space for energy conservation and was able to perform w/c locking while OT managed leg rests. Sit to and from stand with CGA. Pt amb with grocery cart support 50 ft x 2 with only standing rest with CGA overall and reduced pace for  turns. Pt reported 6/10 overall L sh and "cold" hands. Back in room, pt agreeable to recliner vs w/c and was able to amb with RW from doorway to recliner set by windows with CGA. OT assisted pt to elevate LE's. Left pt with chair exit alarm engaged and nurse call button and needs in reach.   Therapy Documentation Precautions:  Precautions Precautions: Cervical, Fall Precaution Booklet Issued: No Precaution Comments: [place] Required Braces or Orthoses: Cervical Brace Cervical Brace: Hard collar Restrictions Weight Bearing Restrictions: No    Therapy/Group: Individual Therapy  Barnabas Lister 10/24/2022, 7:49 AM

## 2022-10-24 NOTE — Progress Notes (Addendum)
PROGRESS NOTE   Subjective/Complaints:  Reports she is doing well overall. She says she is frustrated because he phone charger is missing. I was able to locate the phone charger.  She says voltaren gel is keeping her pain controlled.   ROS:   Pt denies SOB, abd pain, cough, CP, N/V/C/D, and vision changes    Objective:   No results found. No results for input(s): "WBC", "HGB", "HCT", "PLT" in the last 72 hours.  No results for input(s): "NA", "K", "CL", "CO2", "GLUCOSE", "BUN", "CREATININE", "CALCIUM" in the last 72 hours.   Intake/Output Summary (Last 24 hours) at 10/24/2022 2313 Last data filed at 10/24/2022 1730 Gross per 24 hour  Intake 714 ml  Output --  Net 714 ml      Pressure Injury 10/06/22 Buttocks Right Stage 3 -  Full thickness tissue loss. Subcutaneous fat may be visible but bone, tendon or muscle are NOT exposed. see picture in Epic (Active)  10/06/22 1130  Location: Buttocks  Location Orientation: Right  Staging: Stage 3 -  Full thickness tissue loss. Subcutaneous fat may be visible but bone, tendon or muscle are NOT exposed.  Wound Description (Comments): see picture in Epic  Present on Admission: Yes    Physical Exam: Vital Signs Blood pressure 118/65, pulse 89, temperature 98.3 F (36.8 C), temperature source Oral, resp. rate 16, height 5\' 7"  (1.702 m), weight 84.3 kg, SpO2 100 %.        General: awake, alert, appropriate, sitting in bed, NAD HENT: conjugate gaze; oropharynx moist, cervical collar in place CV: regular rate; no JVD Pulmonary: CTA B/L; no W/R/R- good air movement GI: soft, NT, ND, (+)BS Psychiatric: appropriate Neurological: Ox3 Skin:- Has an area where had some previous acne on L rhomboids- a little excoriation- dry skin-  Musculoskeletal L trap much less tight- K tape in place on L shoulder, however tape coming off in 1 place    Comments: RUE- Bicep 4+/5; triceps 4/5;  WE 4-/5; grip 4-/5; FA 4-/5 LUE- Biceps 4-/5; WE 3-/5; Triceps 2/5; Grip 2-/5 and FA 1/5- no real change today on exam RLE- HF, KE 4/5; DF/PF and EHL 4+/5 LLE- DF/PF 4/5 and EHL 4-/5- pt unable to let me uncover her again, she's so cold to test rest of LLE  Skin:    General: Skin is warm and dry.     Comments: Skin laceration to left facial cheek with stitches in place.  ACDF site clean and dry Stage III- superficial stage III 1.3 x 1.0 x 0.5 cm on coccyx with a few tiny spots that are more superficial around it   Neurological:     Mental Status: She is alert.     Comments: Patient is alert and oriented x3.  Follows full commands. Ox3 No abnl tone or clonus RLE/LLE Assessment/Plan: 1. Functional deficits which require 3+ hours per day of interdisciplinary therapy in a comprehensive inpatient rehab setting. Physiatrist is providing close team supervision and 24 hour management of active medical problems listed below. Physiatrist and rehab team continue to assess barriers to discharge/monitor patient progress toward functional and medical goals  Care Tool:  Bathing    Body parts bathed  by patient: Chest, Front perineal area, Abdomen, Face, Left arm, Right upper leg, Left upper leg   Body parts bathed by helper: Right arm, Right lower leg, Left lower leg, Buttocks     Bathing assist Assist Level: Moderate Assistance - Patient 50 - 74%     Upper Body Dressing/Undressing Upper body dressing   What is the patient wearing?: Pull over shirt    Upper body assist Assist Level: Moderate Assistance - Patient 50 - 74%    Lower Body Dressing/Undressing Lower body dressing      What is the patient wearing?: Underwear/pull up, Incontinence brief     Lower body assist Assist for lower body dressing: Moderate Assistance - Patient 50 - 74%     Toileting Toileting    Toileting assist Assist for toileting: 2 Helpers     Transfers Chair/bed transfer  Transfers assist      Chair/bed transfer assist level: 2 Helpers     Locomotion Ambulation   Ambulation assist   Ambulation activity did not occur: Safety/medical concerns          Walk 10 feet activity   Assist  Walk 10 feet activity did not occur: Safety/medical concerns        Walk 50 feet activity   Assist Walk 50 feet with 2 turns activity did not occur: Safety/medical concerns         Walk 150 feet activity   Assist Walk 150 feet activity did not occur: Safety/medical concerns         Walk 10 feet on uneven surface  activity   Assist Walk 10 feet on uneven surfaces activity did not occur: Safety/medical concerns         Wheelchair     Assist Is the patient using a wheelchair?: Yes Type of Wheelchair: Manual    Wheelchair assist level: Dependent - Patient 0%      Wheelchair 50 feet with 2 turns activity    Assist        Assist Level: Dependent - Patient 0%   Wheelchair 150 feet activity     Assist      Assist Level: Dependent - Patient 0%   Blood pressure 118/65, pulse 89, temperature 98.3 F (36.8 C), temperature source Oral, resp. rate 16, height 5\' 7"  (1.702 m), weight 84.3 kg, SpO2 100 %.  Medical Problem List and Plan: 1. 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  Status post ACDF C4-5 10/02/2022 per Dr.Yarbrough.  Cervical collar when out of bed applied in sitting position             -patient may  shower- monitor skin             -ELOS/Goals: 3-4 weeks- min A hopefully  D/c 11/22  Walking well with therapy-   Con't CIR- PT and OT  -DVT/anticoagulation:  Pharmaceutical: Lovenox.   Venous Doppler studies 10/07/2022 negative  11/10- saw getting lovenox BID - will make 40 mg daily.              -antiplatelet therapy: N/A 3. Pain Management: Neurontin 300 mg  twice daily, Robaxin and oxycodone as needed  11/3- pain 8-9/10 this Am, but was due for pain meds- con't regimen for now- will increase to 600 mg BID but no higher due to some elevation/Cr of 1.4-1.5  11/6- pain controlled- con't regimen  11/7- will add voltaren gel 2G QID for hands/feet  11/9- pain controlled with meds- con't regimen  11/14- pain in L shoulder- is myofascial- will schedule robaxin 1000 mg QID  11/15- with taping, Kpad, and Robxin, shoulder pain resolved  11/16- tape off, but pain controlled  -11/18 reports hand pain controlled with voltaren gel 4. Mood/Behavior/Sleep: Lexapro 25 mg daily             -antipsychotic agents: N/A 5. Neuropsych/cognition: This patient is capable of making decisions on her own behalf. 6. Skin/Wound Care left facial cheek laceration: Status post sutures received in ED.  Routine skin checks 7. Fluids/Electrolytes/Nutrition:   -will adv to regular diet. She can make choices as to what she can chew or not 8.  Diabetes mellitus.  Hemoglobin A1c 5.9.  Currently maintained on Semglee 8 units daily.  Patient on Tresiba 8 units daily prior to admission  11/6- CBG's labile- 96-222-  11/10- CBG's 78- 193= with 1 value of 215- but otherwise pretty well controlled- will monitor 11/15- CBG down to 75 this AM, but up to 199 yesterday late AM- will add Metformin 500 mg q breakfast- due to Cr, don't want too high of dose- will check with pt, was she on insulin at home?  11/16- pt was on insulin pen 20 units at home- on 8 units here and Metformin- will increase insulin to 11 units- and will monitor  11/17- Sugars spiked yesterday, much higher than normal- snacks? If doesn't improve, suggest increasing Semglee over weekend  -11/18 CBGs good in AM but higher at mealtime, start 2 u novolog with meals 9.  AKI on CKD stage III.  Baseline creatinine 1.18-1.26.  Follow-up chemistries  11/3-4- Cr up to 1.41- will recheck Monday and push fluids  11/8- Cr 1.35- but has been  hanging around 1.2 to 1.4-   11/14- Cr slightly more at 1.44- will push fluids- because BUN 11 10.  Acute on chronic anemia.  Follow-up CBC 11.  Intrahepatic cholangiocarcinoma.  Patient on chemotherapy followed by Dr. Randa Evens.  Follow-up outpatient- port not accessed- missed last chemo treatment-her car broke down- will need to be here 3-4 weeks.   11/3- supposedly got chemo q2 weeks- will need to check with Oncology what to do while here. D/w PA  11/6- they will restart once leaves rehab 12.  Obesity.  BMI 27.62.  Dietary follow-up 13.  Hypertension.  Norvasc 2.5 mg daily.  Monitor with increased mobility  -well controlled, continue to monitor 14.  History of tobacco use/COPD.  Continue inhalers as indicated 15.  Hyperlipidemia.  Lipitor 16. Complicated UTI.Empiric IV Rocephin 10/08/2022 changed to keflex for 7 days- pending culture, hopefully- will monitor  11/6- ON Keflex for Proteus- is sensitive-  17. Neurogenic bladder- will do bladder scans q6 hours and cath if volumes >350cc  11/14- hasn't required caths.  18. Neurogenic bowel with constipation- will get cleaned out- Sorbitol 45cc now and follow with SSE if no results- might need bowel program- not clear yet.   11/3- cleaned out- was continent  11/7- last night, required a full disimpaction- has hemorrhoids- will treat- will also change Senna to qAM and add Miralax daily.   11/9- will hold miralax/make prn- since had bowel accidents- due to loose stools- con't senna so doesn't get backed up again. Since on pain meds  11/10- having BM on toilet- feeling better about lack of accidents today 11/12 type 6 BM  11/14- said going- but a little soft- will decrease  Senna to 1 tab and con't miralax.   11/17- LBM 2 days ago- medium- if no BM by tomorrow, will need intervention  11/18 LBM today- improved continue to monitor 19. Stage III coccyx pressure ulcer- on admission- will automatically get WOC consult and determine if needs air  mattress, place foam for the moment and turn often- will order turning q2 hours  11/3- will make sure she's on ROHO- seen by WOC- started aquacel with foam dressing to change daily.   11/15- per nursing, is healing- cannot assess today since sitting/eating breakfast 20. Shivering/cold- will order kpad to warm her up and to help back pain.   11/3- resolved 21. Leukocytosis-  11/6- no fever- WBC 19.6- up from 14k-    11/8- WBC back down to her "baseline" of 14.2- 11/14- WBC down to 11.7- doing better-   22. Hemorrhoids  11/7- adding Anusol supp after dinner at 6pm daily.  23. Hypocalcemia  11/8- will monitor  11/14- Ca 7.6- will add Ca carbonate 24. Itching  11/17- will add steroid cream for back BID    LOS: 16 days A FACE TO [place] 10/24/2022, 11:13 PM

## 2022-10-25 LAB — GLUCOSE, CAPILLARY
Glucose-Capillary: 90 mg/dL (ref 70–99)
Glucose-Capillary: 174 mg/dL — ABNORMAL HIGH (ref 70–99)
Glucose-Capillary: 185 mg/dL — ABNORMAL HIGH (ref 70–99)
Glucose-Capillary: 153 mg/dL — ABNORMAL HIGH (ref 70–99)

## 2022-10-25 NOTE — Progress Notes (Signed)
Physical Therapy Session Note  Patient Details  Name: Janice Short MRN: [id number] Date of Birth: 1961-05-01  Today's Date: 10/25/2022 PT Individual Time: 1300-1345 PT Individual Time Calculation (min): 45 min   Short Term Goals: Week 2:  PT Short Term Goal 1 (Week 2): Pt will perform sit to stand with CGA and LRAD PT Short Term Goal 2 (Week 2): Pt will require CGA for dynamic standing balance PT Short Term Goal 3 (Week 2): Pt will require CGA for bed to chair transfer  Skilled Therapeutic Interventions/Progress Updates:      Therapy Documentation Precautions:  Precautions Precautions: Cervical, Fall Precaution Booklet Issued: No Precaution Comments: [place] Required Braces or Orthoses: Cervical Brace Cervical Brace: Hard collar Restrictions Weight Bearing Restrictions: No  Pt received seated in w/c at bedside and agreeable to PT session with emphasis on gait and strength training. Pt ambulated >300 ft throughout session with SBA with RW with verbal cues for pacing. Pt performed alternating cone taps with BUE support on RW 2 x 5 bilaterally with bilateral 2.5# ankle weights to improve coordination and strength with SBA. Pt transitioned to gait training with 2.5# ankle weights to to address strength deficits with RW and requires verbal cueing for increased foot clearance. Pt left seated in w/c at bedside with chair alarm on and all needs within reach.    Therapy/Group: Individual Therapy  Verl Dicker Verl Dicker PT, DPT  10/25/2022, 7:58 AM

## 2022-10-26 LAB — CBC WITH DIFFERENTIAL/PLATELET
Abs Immature Granulocytes: 0.03 10*3/uL (ref 0.00–0.07)
Basophils Absolute: 0.1 10*3/uL (ref 0.0–0.1)
Basophils Relative: 1 %
Eosinophils Absolute: 0.3 10*3/uL (ref 0.0–0.5)
Eosinophils Relative: 3 %
HCT: 25.7 % — ABNORMAL LOW (ref 36.0–46.0)
Hemoglobin: 8.3 g/dL — ABNORMAL LOW (ref 12.0–15.0)
Immature Granulocytes: 0 %
Lymphocytes Relative: 17 %
Lymphs Abs: 1.7 10*3/uL (ref 0.7–4.0)
MCH: 31 pg (ref 26.0–34.0)
MCHC: 32.3 g/dL (ref 30.0–36.0)
MCV: 95.9 fL (ref 80.0–100.0)
Monocytes Absolute: 1.2 10*3/uL — ABNORMAL HIGH (ref 0.1–1.0)
Monocytes Relative: 12 %
Neutro Abs: 6.8 10*3/uL (ref 1.7–7.7)
Neutrophils Relative %: 67 %
Platelets: 72 10*3/uL — ABNORMAL LOW (ref 150–400)
RBC: 2.68 MIL/uL — ABNORMAL LOW (ref 3.87–5.11)
RDW: 22.9 % — ABNORMAL HIGH (ref 11.5–15.5)
WBC: 10.1 10*3/uL (ref 4.0–10.5)
nRBC: 0 % (ref 0.0–0.2)

## 2022-10-26 LAB — BASIC METABOLIC PANEL
Anion gap: 6 (ref 5–15)
BUN: 14 mg/dL (ref 8–23)
CO2: 19 mmol/L — ABNORMAL LOW (ref 22–32)
Calcium: 7.7 mg/dL — ABNORMAL LOW (ref 8.9–10.3)
Chloride: 112 mmol/L — ABNORMAL HIGH (ref 98–111)
Creatinine, Ser: 1.72 mg/dL — ABNORMAL HIGH (ref 0.44–1.00)
GFR, Estimated: 33 mL/min — ABNORMAL LOW (ref >60)
Glucose, Bld: 105 mg/dL — ABNORMAL HIGH (ref 70–99)
Potassium: 4.3 mmol/L (ref 3.5–5.1)
Sodium: 137 mmol/L (ref 135–145)

## 2022-10-26 LAB — GLUCOSE, CAPILLARY
Glucose-Capillary: 89 mg/dL (ref 70–99)
Glucose-Capillary: 134 mg/dL — ABNORMAL HIGH (ref 70–99)
Glucose-Capillary: 108 mg/dL — ABNORMAL HIGH (ref 70–99)
Glucose-Capillary: 156 mg/dL — ABNORMAL HIGH (ref 70–99)
Glucose-Capillary: 96 mg/dL (ref 70–99)

## 2022-10-26 LAB — URINALYSIS, ROUTINE W REFLEX MICROSCOPIC
Glucose, UA: NEGATIVE mg/dL
Hgb urine dipstick: NEGATIVE
Ketones, ur: NEGATIVE mg/dL
Nitrite: NEGATIVE
Protein, ur: 100 mg/dL — AB
Specific Gravity, Urine: 1.02 (ref 1.005–1.030)
pH: 5 (ref 5.0–8.0)

## 2022-10-26 MED ORDER — METHOCARBAMOL 500 MG PO TABS: 500.0000 mg | ORAL_TABLET | Freq: Four times a day (QID) | ORAL | Status: DC

## 2022-10-26 MED ORDER — SODIUM CHLORIDE 0.9 % IV SOLN: INTRAVENOUS | Status: AC

## 2022-10-26 MED ORDER — METHOCARBAMOL 500 MG PO TABS
500.0000 mg | ORAL_TABLET | Freq: Four times a day (QID) | ORAL | Status: DC
  Administered 2022-10-26 – 2022-10-28 (×7): 500 mg via ORAL
  Filled 2022-10-26 (×7): qty 1

## 2022-10-26 NOTE — Progress Notes (Signed)
Occupational Therapy Session Note  Patient Details  Name: Janice Short MRN: [id number] Date of Birth: 12/11/1960  Today's Date: 10/26/2022 OT Individual Time: 0800-0900 1st Session, 1300-1400 2nd Session  OT Individual Time Calculation (min): 60 min, 60 min    Short Term Goals: Week 3:  OT Short Term Goal 1 (Week 3): S for all transfers to toilet and shower seat with RW OT Short Term Goal 2 (Week 3): Pt will amb with RW for gathering self care items with S OT Short Term Goal 3 (Week 3): Pt will retrieve a light item from cabinet or fridge standing level with S  Skilled Therapeutic Interventions/Progress Updates:  1st Session:  Pt sitting at EOB upon OT arrival. Reports needing to use the bathroom. Pt required S for sit to stand and functional amb to and from bathroom 20 ft x 2 with S. Mod I for toileting. MD arrived for assessment and pt reported feeling weak and nauseous.  Once back in w/c, pt requested emesis bin and vomited. Nursing arrived and NT to assist OT and pt with clean up, blood sugar readings and VS. All normal. Meds administered by nursing while pt completed basic bathing and dressing in clean hospital gown with mod I after set up. Pt reported feeling better by the end of session but requested resting back in bed and moved with S only. Left pt with safety measures, call button and needs in place.    2nd Session:  Pt up in w.c and fully dressed upon OT arrival. Pt reports feeling fine from am incident. DME for home delivered and in pt's room ready for d/c 10/28/22.Pt eager to move to demo apt space and tub room for kitchen access and tub shower access training prior to [facility] Day tomorrow. Sister planning for pm Family Educ session 11/21. OT transported pt via w/c to and from therapy for energy conservation and time management. Pt able to amb with S in those spaces for TTB access with mod I transfer and education re: H2O mngt for falls prevention. Pt then amb 35 ft from tub room to  kitchen space with brief seated rest. OT instruction on light meal prep of frozen food item from pantry to freezer to microwave with S using RW and bag for light item transport. Pt also accessed fridge with S as pt keeps insulin in fridge at home. Ot denied pain this visit only cold hands. OT applied warm blanket over hands and shoulders and returned back to room. Pt left w/c level with all safety measures, needs and nurse call button in reach.    Therapy Documentation Precautions:  Precautions Precautions: Cervical, Fall Precaution Booklet Issued: No Precaution Comments: [place] Required Braces or Orthoses: Cervical Brace Cervical Brace: Hard collar Restrictions Weight Bearing Restrictions: No    Therapy/Group: Individual Therapy  Barnabas Lister 10/26/2022, 7:44 AM

## 2022-10-26 NOTE — Progress Notes (Signed)
Patient has refused robaxin 1000 mg twice today--hates those pills. Agreeable to decrease to 500 mg qid and see if she tolerates it. Also discussed CM diet at home as she had fruit salad, banana pudding, yogurt and 2 containers of lemonade for supper.  She was willing to substitute water for lemonade. She was on Tresiba 30 and was to decrease to 20 at home prior to fall and not on novolog. Has been dropping BS in am. Will add HS snack.

## 2022-10-26 NOTE — Progress Notes (Signed)
PROGRESS NOTE   Subjective/Complaints:  Pt reports feeling a little weak/slow this AM Per OT, had an episode of vomiting and feeling better.   Labs show pt dry- Cr up to 1.72 Will give IVFs  LBM yesterday but going on toilet today per pt.   ROS:   Pt denies SOB, abd pain, CP, N/V/C/D, and vision changes   Objective:   No results found. Recent Labs    10/26/22 0453  WBC 10.1  HGB 8.3*  HCT 25.7*  PLT 72*    Recent Labs    10/26/22 0453  NA 137  K 4.3  CL 112*  CO2 19*  GLUCOSE 105*  BUN 14  CREATININE 1.72*  CALCIUM 7.7*     Intake/Output Summary (Last 24 hours) at 10/26/2022 1340 Last data filed at 10/26/2022 1238 Gross per 24 hour  Intake 356 ml  Output --  Net 356 ml     Pressure Injury 10/06/22 Buttocks Right Stage 3 -  Full thickness tissue loss. Subcutaneous fat may be visible but bone, tendon or muscle are NOT exposed. see picture in Epic (Active)  10/06/22 1130  Location: Buttocks  Location Orientation: Right  Staging: Stage 3 -  Full thickness tissue loss. Subcutaneous fat may be visible but bone, tendon or muscle are NOT exposed.  Wound Description (Comments): see picture in Epic  Present on Admission: Yes    Physical Exam: Vital Signs Blood pressure 121/62, pulse 81, temperature 97.8 F (36.6 C), temperature source Oral, resp. rate 18, height 5\' 7"  (1.702 m), weight 84.3 kg, SpO2 100 %.         General: awake, alert, appropriate, moving slowly to get to toilet; appears ill; doesn't feel good; NAD HENT: conjugate gaze; oropharynx dry CV: regular rate; no JVD Pulmonary: CTA B/L; no W/R/R- good air movement GI: soft, NT, ND, (+)BS- hyperactive BS Psychiatric: appropriate Neurological: Ox3  Skin:- Has an area where had some previous acne on L rhomboids- a little excoriation- dry skin-  Musculoskeletal L trap much less tight- K tape in place on L shoulder, however tape  coming off in 1 place    Comments: RUE- Bicep 4+/5; triceps 4/5; WE 4-/5; grip 4-/5; FA 4-/5 LUE- Biceps 4-/5; WE 3-/5; Triceps 2/5; Grip 2-/5 and FA 1/5- no real change today on exam RLE- HF, KE 4/5; DF/PF and EHL 4+/5 LLE- DF/PF 4/5 and EHL 4-/5- pt unable to let me uncover her again, she's so cold to test rest of LLE  Skin:    General: Skin is warm and dry.     Comments: Skin laceration to left facial cheek with stitches in place.  ACDF site clean and dry Stage III- superficial stage III 1.3 x 1.0 x 0.5 cm on coccyx with a few tiny spots that are more superficial around it   Neurological:     Mental Status: She is alert.     Comments: Patient is alert and oriented x3.  Follows full commands. Ox3 No abnl tone or clonus RLE/LLE Assessment/Plan: 1. Functional deficits which require 3+ hours per day of interdisciplinary therapy in a comprehensive inpatient rehab setting. Physiatrist is providing close team supervision and 24 hour  management of active medical problems listed below. Physiatrist and rehab team continue to assess barriers to discharge/monitor patient progress toward functional and medical goals  Care Tool:  Bathing    Body parts bathed by patient: Chest, Front perineal area, Abdomen, Face, Left arm, Right upper leg, Left upper leg   Body parts bathed by helper: Right arm, Right lower leg, Left lower leg, Buttocks     Bathing assist Assist Level: Moderate Assistance - Patient 50 - 74%     Upper Body Dressing/Undressing Upper body dressing   What is the patient wearing?: Pull over shirt    Upper body assist Assist Level: Moderate Assistance - Patient 50 - 74%    Lower Body Dressing/Undressing Lower body dressing      What is the patient wearing?: Underwear/pull up, Incontinence brief     Lower body assist Assist for lower body dressing: Moderate Assistance - Patient 50 - 74%     Toileting Toileting    Toileting assist Assist for toileting: Set up assist      Transfers Chair/bed transfer  Transfers assist     Chair/bed transfer assist level: 2 Helpers     Locomotion Ambulation   Ambulation assist   Ambulation activity did not occur: Safety/medical concerns          Walk 10 feet activity   Assist  Walk 10 feet activity did not occur: Safety/medical concerns        Walk 50 feet activity   Assist Walk 50 feet with 2 turns activity did not occur: Safety/medical concerns         Walk 150 feet activity   Assist Walk 150 feet activity did not occur: Safety/medical concerns         Walk 10 feet on uneven surface  activity   Assist Walk 10 feet on uneven surfaces activity did not occur: Safety/medical concerns         Wheelchair     Assist Is the patient using a wheelchair?: Yes Type of Wheelchair: Manual    Wheelchair assist level: Dependent - Patient 0%      Wheelchair 50 feet with 2 turns activity    Assist        Assist Level: Dependent - Patient 0%   Wheelchair 150 feet activity     Assist      Assist Level: Dependent - Patient 0%   Blood pressure 121/62, pulse 81, temperature 97.8 F (36.6 C), temperature source Oral, resp. rate 18, height 5\' 7"  (1.702 m), weight 84.3 kg, SpO2 100 %.  Medical Problem List and Plan: 1. 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  Status post ACDF C4-5 10/02/2022 per Dr.Yarbrough.  Cervical collar when out of bed applied in sitting position             -patient may  shower- monitor skin             -ELOS/Goals: 3-4 weeks- min A hopefully  D/c 11/22  D/c Wednesday- waiting for sister to come for family ed- therapy will arrange with SW- pt felt bad this AM, but haven't heard if feeling better -DVT/anticoagulation:  Pharmaceutical: Lovenox.   Venous Doppler studies  10/07/2022 negative  11/10- saw getting lovenox BID - will make 40 mg daily.              -antiplatelet therapy: N/A 3.  Pain Management: Neurontin 300 mg twice daily, Robaxin and oxycodone as needed  11/3- pain 8-9/10 this Am, but was due for pain meds- con't regimen for now- will increase to 600 mg BID but no higher due to some elevation/Cr of 1.4-1.5  11/6- pain controlled- con't regimen  11/7- will add voltaren gel 2G QID for hands/feet  11/9- pain controlled with meds- con't regimen  11/14- pain in L shoulder- is myofascial- will schedule robaxin 1000 mg QID  11/15- with taping, Kpad, and Robxin, shoulder pain resolved  11/16- tape off, but pain controlled  -11/18 reports hand pain controlled with voltaren gel  11/20- pain controlled 4. Mood/Behavior/Sleep: Lexapro 25 mg daily             -antipsychotic agents: N/A 5. Neuropsych/cognition: This patient is capable of making decisions on her own behalf. 6. Skin/Wound Care left facial cheek laceration: Status post sutures received in ED.  Routine skin checks 7. Fluids/Electrolytes/Nutrition:   -will adv to regular diet. She can make choices as to what she can chew or not 8.  Diabetes mellitus.  Hemoglobin A1c 5.9.  Currently maintained on Semglee 8 units daily.  Patient on Tresiba 8 units daily prior to admission  11/6- CBG's labile- 96-222-  11/10- CBG's 78- 193= with 1 value of 215- but otherwise pretty well controlled- will monitor 11/15- CBG down to 75 this AM, but up to 199 yesterday late AM- will add Metformin 500 mg q breakfast- due to Cr, don't want too high of dose- will check with pt, was she on insulin at home?  11/16- pt was on insulin pen 20 units at home- on 8 units here and Metformin- will increase insulin to 11 units- and will monitor  11/17- Sugars spiked yesterday, much higher than normal- snacks? If doesn't improve, suggest increasing Semglee over weekend  -11/18 CBGs good in AM but higher at mealtime, start 2 u novolog  with meals  11/20- CBGs' look good- con't regimen 9.  AKI on CKD stage III.  Baseline creatinine 1.18-1.26.  Follow-up chemistries  11/3-4- Cr up to 1.41- will recheck Monday and push fluids  11/8- Cr 1.35- but has been hanging around 1.2 to 1.4-   11/14- Cr slightly more at 1.44- will push fluids- because BUN 11  11/20- Cr up to 1.72- might be why she feels bad- will give IVFs, since dehydrated 10.  Acute on chronic anemia.  Follow-up CBC 11.  Intrahepatic cholangiocarcinoma.  Patient on chemotherapy followed by Dr. Randa Evens.  Follow-up outpatient- port not accessed- missed last chemo treatment-her car broke down- will need to be here 3-4 weeks.   11/3- supposedly got chemo q2 weeks- will need to check with Oncology what to do while here. D/w PA  11/6- they will restart once leaves rehab 12.  Obesity.  BMI 27.62.  Dietary follow-up 13.  Hypertension.  Norvasc 2.5 mg daily.  Monitor with increased mobility  -well controlled, continue to monitor 14.  History of tobacco use/COPD.  Continue inhalers as indicated 15.  Hyperlipidemia.  Lipitor 16. Complicated UTI.Empiric IV Rocephin 10/08/2022 changed to keflex for 7 days- pending culture, hopefully- will monitor  11/6- ON Keflex for Proteus- is sensitive-  17. Neurogenic bladder- will do bladder scans q6 hours and cath if volumes >350cc  11/14- hasn't required caths.  18. Neurogenic bowel with constipation- will get cleaned out- Sorbitol 45cc now and follow with SSE if no results- might need bowel program- not clear yet.   11/14- said going-  but a little soft- will decrease Senna to 1 tab and con't miralax.   11/17- LBM 2 days ago- medium- if no BM by tomorrow, will need intervention  11/18 LBM today- improved continue to monitor  11/20- LBM yesterday 19. Stage III coccyx pressure ulcer- on admission- will automatically get WOC consult and determine if needs air mattress, place foam for the moment and turn often- will order turning q2  hours  11/3- will make sure she's on ROHO- seen by WOC- started aquacel with foam dressing to change daily.   11/15- per nursing, is healing- cannot assess today since sitting/eating breakfast 20. Shivering/cold- will order kpad to warm her up and to help back pain.   11/3- resolved 21. Leukocytosis-  11/6- no fever- WBC 19.6- up from 14k-    11/8- WBC back down to her "baseline" of 14.2- 11/14- WBC down to 11.7- doing better-   22. Hemorrhoids  11/7- adding Anusol supp after dinner at 6pm daily.  23. Hypocalcemia  11/8- will monitor  11/14- Ca 7.6- will add Ca carbonate 24. Itching  11/17- will add steroid cream for back BID 25. Thrombocytopenia  11/20- plts down to 72k- down from 124k, down from 200k- will stop Lovenox- esp since plts not doing great AND is walking >200 ft usually.  26. N/V  11/20- will give IVFs and anti nausea medicine- will check U/A to make sure- no dysuria however.    I spent a total of 39   minutes on total care today- >50% coordination of care- due to d/w team about N/V and not feeling well.   LOS: 18 days A FACE TO FACE EVALUATION WAS PERFORMED  Pasquale Matters 10/26/2022, 1:40 PM

## 2022-10-26 NOTE — Progress Notes (Signed)
Patient new orders for 0.9% Sodium Chloride started at 75 ml/hr per order. UA CNS completed as ordered and sent to lab. Per Dr. Dagoberto Ligas patient can have 2 cans of diet coke daily, and encourage fluids. Patient declined robaxin at this time. States she does not like the taste and she is not feeling well. Resting in bed at this time.

## 2022-10-26 NOTE — Progress Notes (Signed)
Physical Therapy Session Note  Patient Details  Name: Janice Short MRN: [id number] Date of Birth: 05-Dec-1961  Today's Date: 10/26/2022 PT Individual Time: 1100-3496 PT Individual Time Calculation (min): 61 min   Short Term Goals: Week 2:  PT Short Term Goal 1 (Week 2): Pt will perform sit to stand with CGA and LRAD PT Short Term Goal 2 (Week 2): Pt will require CGA for dynamic standing balance PT Short Term Goal 3 (Week 2): Pt will require CGA for bed to chair transfer  Skilled Therapeutic Interventions/Progress Updates:      Therapy Documentation Precautions:  Precautions Precautions: Cervical, Fall Precaution Booklet Issued: No Precaution Comments: [place] Required Braces or Orthoses: Cervical Brace Cervical Brace: Hard collar Restrictions Weight Bearing Restrictions: No   Pt received semi-reclined in bed, agreeable to PT and without verbal complaints of pain during session. Pt supervision for safety with bed mobility and squat pivot transfer to w/c. Pt performed self-care tasks at sink with supervision and PT adjusted pt's RW that was recently delivered. Pt supervision for toilet transfer. Pt called family and confirmed education training for tomorrow 11/21 from 1-2.  Pt left in bathroom with nursing present.    Therapy/Group: Individual Therapy  Verl Dicker Verl Dicker PT, DPT  10/26/2022, 7:46 AM

## 2022-10-26 NOTE — Progress Notes (Addendum)
Physical Therapy Session Note  Patient Details  Name: Janice Short MRN: [id number] Date of Birth: July 07, 1961  Today's Date: 10/26/2022 PT Individual Time: 0900-0925 PT Individual Time Calculation (min): 25 min   Short Term Goals: Week 2:  PT Short Term Goal 1 (Week 2): Pt will perform sit to stand with CGA and LRAD PT Short Term Goal 2 (Week 2): Pt will require CGA for dynamic standing balance PT Short Term Goal 3 (Week 2): Pt will require CGA for bed to chair transfer  Skilled Therapeutic Interventions/Progress Updates:      Pt supine in bed to start. Per OT, patient vomiting in earlier session. Patient denies pain but reports feeling unwell. She requests light activity and bed level only.   Supine there-ex: -1x20 ankle pumps -1x15 hip abd/add -1x15 heel slides -1x15 SLR -1x15 glut sets -1x15 SAQ -1x15 quad sets *All completed bilaterally *AROM for all there-ex, cues for dosage, muscle activation, and sequencing. Rest breaks b/w sets 2/2 fatigue.  Session concluded with patient in bed, all needs met.    Therapy Documentation Precautions:  Precautions Precautions: Cervical, Fall Precaution Booklet Issued: No Precaution Comments: [place] Required Braces or Orthoses: Cervical Brace Cervical Brace: Hard collar Restrictions Weight Bearing Restrictions: No General:    Therapy/Group: Individual Therapy  Janice Short PT 10/26/2022, 9:06 AM

## 2022-10-27 DIAGNOSIS — N390 Urinary tract infection, site not specified: Secondary | ICD-10-CM | POA: Insufficient documentation

## 2022-10-27 LAB — CBC WITH DIFFERENTIAL/PLATELET
Abs Immature Granulocytes: 0.03 10*3/uL (ref 0.00–0.07)
Basophils Absolute: 0 10*3/uL (ref 0.0–0.1)
Basophils Relative: 1 %
Eosinophils Absolute: 0.2 10*3/uL (ref 0.0–0.5)
Eosinophils Relative: 2 %
HCT: 25.1 % — ABNORMAL LOW (ref 36.0–46.0)
Hemoglobin: 8.2 g/dL — ABNORMAL LOW (ref 12.0–15.0)
Immature Granulocytes: 0 %
Lymphocytes Relative: 20 %
Lymphs Abs: 1.7 10*3/uL (ref 0.7–4.0)
MCH: 30.8 pg (ref 26.0–34.0)
MCHC: 32.7 g/dL (ref 30.0–36.0)
MCV: 94.4 fL (ref 80.0–100.0)
Monocytes Absolute: 1.3 10*3/uL — ABNORMAL HIGH (ref 0.1–1.0)
Monocytes Relative: 16 %
Neutro Abs: 5.1 10*3/uL (ref 1.7–7.7)
Neutrophils Relative %: 61 %
Platelets: 57 10*3/uL — ABNORMAL LOW (ref 150–400)
RBC: 2.66 MIL/uL — ABNORMAL LOW (ref 3.87–5.11)
RDW: 22.9 % — ABNORMAL HIGH (ref 11.5–15.5)
Smear Review: DECREASED
WBC: 8.4 10*3/uL (ref 4.0–10.5)
nRBC: 0 % (ref 0.0–0.2)

## 2022-10-27 LAB — GLUCOSE, CAPILLARY
Glucose-Capillary: 74 mg/dL (ref 70–99)
Glucose-Capillary: 199 mg/dL — ABNORMAL HIGH (ref 70–99)
Glucose-Capillary: 119 mg/dL — ABNORMAL HIGH (ref 70–99)
Glucose-Capillary: 201 mg/dL — ABNORMAL HIGH (ref 70–99)

## 2022-10-27 LAB — BASIC METABOLIC PANEL
Anion gap: 6 (ref 5–15)
BUN: 15 mg/dL (ref 8–23)
CO2: 18 mmol/L — ABNORMAL LOW (ref 22–32)
Calcium: 7.7 mg/dL — ABNORMAL LOW (ref 8.9–10.3)
Chloride: 111 mmol/L (ref 98–111)
Creatinine, Ser: 1.61 mg/dL — ABNORMAL HIGH (ref 0.44–1.00)
GFR, Estimated: 36 mL/min — ABNORMAL LOW (ref >60)
Glucose, Bld: 272 mg/dL — ABNORMAL HIGH (ref 70–99)
Potassium: 4.6 mmol/L (ref 3.5–5.1)
Sodium: 135 mmol/L (ref 135–145)

## 2022-10-27 MED ORDER — HYDROCORTISONE 1 % EX CREA
TOPICAL_CREAM | Freq: Two times a day (BID) | CUTANEOUS | 0 refills | Status: AC
  Filled 2022-10-27: qty 30, fill #0

## 2022-10-27 MED ORDER — CEPHALEXIN 500 MG PO CAPS
500.0000 mg | ORAL_CAPSULE | Freq: Two times a day (BID) | ORAL | 0 refills | Status: DC
  Filled 2022-10-27: qty 7, 4d supply, fill #0

## 2022-10-27 MED ORDER — ONDANSETRON HCL 4 MG PO TABS
4.0000 mg | ORAL_TABLET | Freq: Four times a day (QID) | ORAL | 0 refills | Status: AC | PRN
  Filled 2022-10-27: qty 20, 5d supply, fill #0

## 2022-10-27 MED ORDER — ZINC SULFATE 220 (50 ZN) MG PO TABS
220.0000 mg | ORAL_TABLET | Freq: Every day | ORAL | 0 refills | Status: AC
  Filled 2022-10-27: qty 30, 30d supply, fill #0

## 2022-10-27 MED ORDER — AMLODIPINE BESYLATE 2.5 MG PO TABS
2.5000 mg | ORAL_TABLET | Freq: Every day | ORAL | 0 refills | Status: AC
  Filled 2022-10-27: qty 30, 30d supply, fill #0

## 2022-10-27 MED ORDER — SENNOSIDES-DOCUSATE SODIUM 8.6-50 MG PO TABS
1.0000 | ORAL_TABLET | Freq: Every day | ORAL | 0 refills | Status: AC
  Filled 2022-10-27: qty 30, 30d supply, fill #0

## 2022-10-27 MED ORDER — CALCIUM CARBONATE 1500 (600 CA) MG PO TABS
1.0000 | ORAL_TABLET | Freq: Three times a day (TID) | ORAL | 0 refills | Status: AC
  Filled 2022-10-27: qty 90, 30d supply, fill #0

## 2022-10-27 MED ORDER — CEPHALEXIN 250 MG PO CAPS
500.0000 mg | ORAL_CAPSULE | Freq: Two times a day (BID) | ORAL | Status: DC
  Administered 2022-10-27 – 2022-10-28 (×3): 500 mg via ORAL
  Filled 2022-10-27 (×3): qty 2

## 2022-10-27 MED ORDER — WITCH HAZEL-GLYCERIN EX PADS
MEDICATED_PAD | CUTANEOUS | 12 refills | Status: AC | PRN
  Filled 2022-10-27: qty 40, fill #0

## 2022-10-27 MED ORDER — METHOCARBAMOL 500 MG PO TABS
500.0000 mg | ORAL_TABLET | Freq: Four times a day (QID) | ORAL | 0 refills | Status: DC
  Filled 2022-10-27: qty 120, 30d supply, fill #0

## 2022-10-27 MED ORDER — TRESIBA FLEXTOUCH 100 UNIT/ML ~~LOC~~ SOPN: 11.0000 [IU] | PEN_INJECTOR | Freq: Every day | SUBCUTANEOUS | 1 refills | Status: DC

## 2022-10-27 MED ORDER — OXYCODONE HCL 5 MG PO TABS
5.0000 mg | ORAL_TABLET | Freq: Four times a day (QID) | ORAL | 0 refills | Status: DC | PRN
  Filled 2022-10-27: qty 120, 30d supply, fill #0
  Filled 2022-10-28: qty 28, 7d supply, fill #0

## 2022-10-27 MED ORDER — CALCIUM CARBONATE 1250 (500 CA) MG PO TABS
1.0000 | ORAL_TABLET | Freq: Three times a day (TID) | ORAL | Status: DC
  Administered 2022-10-27 – 2022-10-28 (×4): 1250 mg via ORAL
  Filled 2022-10-27 (×5): qty 1

## 2022-10-27 MED ORDER — DICLOFENAC SODIUM 1 % EX GEL
2.0000 g | Freq: Four times a day (QID) | CUTANEOUS | 0 refills | Status: AC
  Filled 2022-10-27: qty 400, fill #0

## 2022-10-27 NOTE — Plan of Care (Signed)
  Problem: RH Balance Goal: LTG: Patient will maintain dynamic sitting balance (OT) Description: LTG:  Patient will maintain dynamic sitting balance with assistance during activities of daily living (OT) Outcome: Completed/Met Goal: LTG Patient will maintain dynamic standing with ADLs (OT) Description: LTG:  Patient will maintain dynamic standing balance with assist during activities of daily living (OT)  Outcome: Completed/Met   Problem: RH Eating Goal: LTG Patient will perform eating w/assist, cues/equip (OT) Description: LTG: Patient will perform eating with assist, with/without cues using equipment (OT) Outcome: Completed/Met   Problem: RH Grooming Goal: LTG Patient will perform grooming w/assist,cues/equip (OT) Description: LTG: Patient will perform grooming with assist, with/without cues using equipment (OT) Outcome: Completed/Met   Problem: RH Dressing Goal: LTG Patient will perform upper body dressing (OT) Description: LTG Patient will perform upper body dressing with assist, with/without cues (OT). Outcome: Completed/Met Goal: LTG Patient will perform lower body dressing w/assist (OT) Description: LTG: Patient will perform lower body dressing with assist, with/without cues in positioning using equipment (OT) Outcome: Completed/Met   Problem: RH Toileting Goal: LTG Patient will perform toileting task (3/3 steps) with assistance level (OT) Description: LTG: Patient will perform toileting task (3/3 steps) with assistance level (OT)  Outcome: Completed/Met   Problem: RH Toilet Transfers Goal: LTG Patient will perform toilet transfers w/assist (OT) Description: LTG: Patient will perform toilet transfers with assist, with/without cues using equipment (OT) Outcome: Completed/Met   Problem: RH Tub/Shower Transfers Goal: LTG Patient will perform tub/shower transfers w/assist (OT) Description: LTG: Patient will perform tub/shower transfers with assist, with/without cues using  equipment (OT) Outcome: Completed/Met   Problem: RH Functional Use of Upper Extremity Goal: LTG Patient will use RT/LT upper extremity as a (OT) Description: LTG: Patient will use right/left upper extremity as a stabilizer/gross assist/diminished/nondominant/dominant level with assist, with/without cues during functional activity (OT) Outcome: Completed/Met

## 2022-10-27 NOTE — Patient Care Conference (Signed)
Inpatient RehabilitationTeam Conference and Plan of Care Update Date: 10/27/2022   Time: 11:38 AM   Patient Name: Janice Short      Medical Record Number: [id number]  Date of Birth: Aug 15, 1961 Sex: Female         Room/Bed: 4W05C/4W05C-01 Payor Info: Payor: [employer] MEDICARE / Plan: [employer] MEDICARE / Product Type: *No Product type* /    Admit Date/Time:  10/08/2022  1:33 PM  Primary Diagnosis:  Acute incomplete quadriplegia [facility])  Hospital Problems: Principal Problem:   Acute incomplete quadriplegia ([place]) Active Problems:   Uncontrolled type 2 diabetes mellitus with hyperglycemia, with long-term current use of insulin ([place])   Intrahepatic cholangiocarcinoma ([place])   Central cord syndrome ([place])   Neurogenic bowel   Pressure ulcer, stage 3 ([place])   UTI (urinary tract infection)   Hypocalcemia    Expected Discharge Date: Expected Discharge Date: 10/28/22  Team Members Present: Physician leading conference: Dr. Courtney Heys Social Worker Present: Loralee Pacas, [place] Nurse Present: Tacy Learn, RN PT Present: Verl Dicker, PT OT Present: Jamey Ripa, OT PPS Coordinator present : Gunnar Fusi, SLP     Current Status/Progress Goal Weekly Team Focus  Bowel/Bladder   Continent of B/B.  *** Remain continent.   Assist w/ toileting needs q 2-4 hours & PRN    Swallow/Nutrition/ Hydration      ***         ADL's   mod I BADL's, S light IADL's and TTB with Rw  *** mod I w/c, CGA toilet transfers   complete Family Ed for d/c    Mobility   supervision bed, SBA transfers & gait x 200 ft  *** supervision  gait, activity tolerance,balance/coordination    Communication      ***          Safety/Cognition/ Behavioral Observations     ***          Pain   Pt c/o of R shoulder pain rating at a 9/10. Oxy is given for pain relief.  *** Keep pain < 4/10   Assess pain q shift & PRN.    Skin   Incision to L side of neck - skin glue, OTA. Abrasion to R  eye/cheek - skin intact  *** Remain free of skin breakdowns.  Assess skin q shift & PRN      Discharge Planning:      Team Discussion: Acute incomplete quadriplegia. Continent B/B. Pain managed with PRN medications. Healed stage III to coccyx. Incision healing without drainage. Keflex for UTI. Lovenox stopped due to walking. IV fluids with labs in am. Family education today,.  Patient on target to meet rehab goals: yes, therapies at goal level for discharge.   *See Care Plan and progress notes for long and short-term goals.   Revisions to Treatment Plan:  Medication adjustments, monitor labs, IV fluids  Teaching Needs: Medications, safety, gait/transfer training, skin/wound care, etc.   Current Barriers to Discharge: Decreased caregiver support, Wound care, Weight bearing restrictions, and Medication compliance  Possible Resolutions to Barriers: Family education, nursing education, order recommended DME, [facility]     Medical Summary Current Status: continent of B/B- always c/o pain- Stage III on coccyx- new pic-a lot of slough-  Barriers to Discharge: Behavior/Mood;Complicated Wound;Other (comments);Infection/IV Antibiotics;Inadequate Nutritional Intake;Renal Insufficiency/Failure;Self-care education  Barriers to Discharge Comments: PO ABX due to new UTI=-pushed to drink 6-8 cups/day-Giving IVFs and labs in AM Possible Resolutions to [employer] Focus: is at goal level- Supervision- with PT; mod I ADLs-  met/above goals- d/c tomorrow   Continued Need for Acute Rehabilitation Level of Care: The patient requires daily medical management by a physician with specialized training in physical medicine and rehabilitation for the following reasons: Direction of a multidisciplinary physical rehabilitation program to maximize functional independence : Yes Medical management of patient stability for increased activity during participation in an intensive rehabilitation regime.: Yes Analysis of  laboratory values and/or radiology reports with any subsequent need for medication adjustment and/or medical intervention. : Yes   I attest that I was present, lead the team conference, and concur with the assessment and plan of the team.   Ernest Pine 10/27/2022, 6:34 PM

## 2022-10-27 NOTE — Progress Notes (Signed)
PROGRESS NOTE   Subjective/Complaints:  Pt vomited yesterday, and took Zofran again this Am for some nausea- explained she has UTI- started Keflex for that Stopped Lovenox since walking 200 ft and plts 57k this AM- likely worse due to IVFs, but will recheck in AM to make sure.   LBM yesterday  Last UTI was Proteus and sensitive to Keflex- due to Cr, spoke with pharmacy and doing Keflex 500 mg BID, not TID  ROS:   Pt denies SOB, abd pain, CP, N/V/C/D, and vision changes   Objective:   No results found. Recent Labs    10/26/22 0453 10/27/22 0558  WBC 10.1 8.4  HGB 8.3* 8.2*  HCT 25.7* 25.1*  PLT 72* 57*    Recent Labs    10/26/22 0453  NA 137  K 4.3  CL 112*  CO2 19*  GLUCOSE 105*  BUN 14  CREATININE 1.72*  CALCIUM 7.7*     Intake/Output Summary (Last 24 hours) at 10/27/2022 0855 Last data filed at 10/27/2022 0801 Gross per 24 hour  Intake 1605.34 ml  Output 451 ml  Net 1154.34 ml     Pressure Injury 10/06/22 Buttocks Right Stage 3 -  Full thickness tissue loss. Subcutaneous fat may be visible but bone, tendon or muscle are NOT exposed. see picture in Epic (Active)  10/06/22 1130  Location: Buttocks  Location Orientation: Right  Staging: Stage 3 -  Full thickness tissue loss. Subcutaneous fat may be visible but bone, tendon or muscle are NOT exposed.  Wound Description (Comments): see picture in Epic  Present on Admission: Yes    Physical Exam: Vital Signs Blood pressure 131/62, pulse 84, temperature 98.2 F (36.8 C), resp. rate 16, height 5\' 7"  (1.702 m), weight 84.3 kg, SpO2 99 %.          General: awake, alert, appropriate, sitting EOB; NAD HENT: conjugate gaze; oropharynx moist- has water at bedside in 2 cups CV: regular rate; no JVD Pulmonary: CTA B/L; no W/R/R- good air movement GI: soft, NT, ND, (+)BS Psychiatric: appropriate- tearful at times Neurological: Ox3 Skin:- Has an  area where had some previous acne on L rhomboids- a little excoriation- dry skin-  Musculoskeletal L trap much less tight- K tape in place on L shoulder, however tape coming off in 1 place    Comments: RUE- Bicep 4+/5; triceps 4/5; WE 4-/5; grip 4-/5; FA 4-/5 LUE- Biceps 4-/5; WE 3-/5; Triceps 2/5; Grip 2-/5 and FA 1/5- no real change today on exam RLE- HF, KE 4/5; DF/PF and EHL 4+/5 LLE- DF/PF 4/5 and EHL 4-/5- pt unable to let me uncover her again, she's so cold to test rest of LLE  Skin:    General: Skin is warm and dry.     Comments: Skin laceration to left facial cheek with stitches in place.  ACDF site clean and dry Stage III- superficial stage III 1.3 x 1.0 x 0.5 cm on coccyx with a few tiny spots that are more superficial around it   Neurological:     Mental Status: She is alert.     Comments: Patient is alert and oriented x3.  Follows full commands. Ox3 No abnl  tone or clonus RLE/LLE Assessment/Plan: 1. Functional deficits which require 3+ hours per day of interdisciplinary therapy in a comprehensive inpatient rehab setting. Physiatrist is providing close team supervision and 24 hour management of active medical problems listed below. Physiatrist and rehab team continue to assess barriers to discharge/monitor patient progress toward functional and medical goals  Care Tool:  Bathing    Body parts bathed by patient: Chest, Front perineal area, Abdomen, Face, Left arm, Right upper leg, Left upper leg, Right arm, Right lower leg, Left lower leg, Buttocks   Body parts bathed by helper: Right arm, Right lower leg, Left lower leg, Buttocks     Bathing assist Assist Level: Independent with assistive device     Upper Body Dressing/Undressing Upper body dressing   What is the patient wearing?: Pull over shirt, Orthosis    Upper body assist Assist Level: Independent    Lower Body Dressing/Undressing Lower body dressing      What is the patient wearing?: Incontinence brief,  Pants     Lower body assist Assist for lower body dressing: Independent with assitive device     Toileting Toileting    Toileting assist Assist for toileting: Independent with assistive device     Transfers Chair/bed transfer  Transfers assist     Chair/bed transfer assist level: 2 Helpers     Locomotion Ambulation   Ambulation assist   Ambulation activity did not occur: Safety/medical concerns          Walk 10 feet activity   Assist  Walk 10 feet activity did not occur: Safety/medical concerns        Walk 50 feet activity   Assist Walk 50 feet with 2 turns activity did not occur: Safety/medical concerns         Walk 150 feet activity   Assist Walk 150 feet activity did not occur: Safety/medical concerns         Walk 10 feet on uneven surface  activity   Assist Walk 10 feet on uneven surfaces activity did not occur: Safety/medical concerns         Wheelchair     Assist Is the patient using a wheelchair?: Yes Type of Wheelchair: Manual    Wheelchair assist level: Dependent - Patient 0%      Wheelchair 50 feet with 2 turns activity    Assist        Assist Level: Dependent - Patient 0%   Wheelchair 150 feet activity     Assist      Assist Level: Dependent - Patient 0%   Blood pressure 131/62, pulse 84, temperature 98.2 F (36.8 C), resp. rate 16, height 5\' 7"  (1.702 m), weight 84.3 kg, SpO2 99 %.  Medical Problem List and Plan: 1. 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  Status post ACDF C4-5 10/02/2022 per Dr.Yarbrough.  Cervical collar when out of bed applied in sitting position             -patient may  shower- monitor skin             -ELOS/Goals: 3-4 weeks- min A hopefully  D/c 11/22  Pt insists on leaving tomorrow- I think it's  reasonable, since treating UTI  Con't CIR- PT and OT- team conference today to finalize d/c. Educated to drink 6-8 cups water/day -DVT/anticoagulation:  Pharmaceutical: Lovenox.   Venous Doppler studies  10/07/2022 negative  11/10- saw getting lovenox BID - will make 40 mg daily.              -antiplatelet therapy: N/A 3. Pain Management: Neurontin 300 mg twice daily, Robaxin and oxycodone as needed  11/3- pain 8-9/10 this Am, but was due for pain meds- con't regimen for now- will increase to 600 mg BID but no higher due to some elevation/Cr of 1.4-1.5  11/6- pain controlled- con't regimen  11/7- will add voltaren gel 2G QID for hands/feet  11/9- pain controlled with meds- con't regimen  11/14- pain in L shoulder- is myofascial- will schedule robaxin 1000 mg QID  11/15- with taping, Kpad, and Robxin, shoulder pain resolved  11/16- tape off, but pain controlled  -11/18 reports hand pain controlled with voltaren gel  11/20- pain controlled 4. Mood/Behavior/Sleep: Lexapro 25 mg daily             -antipsychotic agents: N/A 5. Neuropsych/cognition: This patient is capable of making decisions on her own behalf. 6. Skin/Wound Care left facial cheek laceration: Status post sutures received in ED.  Routine skin checks 7. Fluids/Electrolytes/Nutrition:   -will adv to regular diet. She can make choices as to what she can chew or not 8.  Diabetes mellitus.  Hemoglobin A1c 5.9.  Currently maintained on Semglee 8 units daily.  Patient on Tresiba 8 units daily prior to admission  11/6- CBG's labile- 96-222-  11/10- CBG's 78- 193= with 1 value of 215- but otherwise pretty well controlled- will monitor 11/15- CBG down to 75 this AM, but up to 199 yesterday late AM- will add Metformin 500 mg q breakfast- due to Cr, don't want too high of dose- will check with pt, was she on insulin at home?  11/16- pt was on insulin pen 20 units at home- on 8 units here and Metformin- will increase insulin to 11 units- and  will monitor  11/17- Sugars spiked yesterday, much higher than normal- snacks? If doesn't improve, suggest increasing Semglee over weekend  -11/18 CBGs good in AM but higher at mealtime, start 2 u novolog with meals  11/21- Cbgs look good- con't regimen 9.  AKI on CKD stage III.  Baseline creatinine 1.18-1.26.  Follow-up chemistries  11/3-4- Cr up to 1.41- will recheck Monday and push fluids  11/8- Cr 1.35- but has been hanging around 1.2 to 1.4-   11/14- Cr slightly more at 1.44- will push fluids- because BUN 11  11/20- Cr up to 1.72- might be why she feels bad- will give IVFs, since dehydrated  11/21- Has UTI- but on IVFs- ordered BMP but not done so far this AM- is active per chart- will recheck in AM 10.  Acute on chronic anemia.  Follow-up CBC 11.  Intrahepatic cholangiocarcinoma.  Patient on chemotherapy followed by Dr. Randa Evens.  Follow-up outpatient- port not accessed- missed last chemo treatment-her car broke down- will need to be here 3-4 weeks.   11/3- supposedly got chemo q2 weeks- will need to check with Oncology what to do while here. D/w PA  11/6- they will restart once leaves rehab 12.  Obesity.  BMI 27.62.  Dietary follow-up 13.  Hypertension.  Norvasc 2.5 mg daily.  Monitor with increased mobility  -well controlled, continue to monitor 14.  History of tobacco use/COPD.  Continue inhalers as indicated 15.  Hyperlipidemia.  Lipitor 16. Complicated UTI.Empiric IV Rocephin 10/08/2022 changed to keflex for 7 days- pending culture, hopefully- will monitor  11/6- ON Keflex for  Proteus- is sensitive-  17. Neurogenic bladder- will do bladder scans q6 hours and cath if volumes >350cc  11/14- hasn't required caths.  18. Neurogenic bowel with constipation- will get cleaned out- Sorbitol 45cc now and follow with SSE if no results- might need bowel program- not clear yet.   11/14- said going- but a little soft- will decrease Senna to 1 tab and con't miralax.   11/17- LBM 2 days ago-  medium- if no BM by tomorrow, will need intervention  11/18 LBM today- improved continue to monitor  11/20- LBM yesterday 19. Stage III coccyx pressure ulcer- on admission- will automatically get WOC consult and determine if needs air mattress, place foam for the moment and turn often- will order turning q2 hours  11/3- will make sure she's on ROHO- seen by WOC- started aquacel with foam dressing to change daily.   11/15- per nursing, is healing- cannot assess today since sitting/eating breakfast 20. Shivering/cold- will order kpad to warm her up and to help back pain.   11/3- resolved 21. Leukocytosis-  11/6- no fever- WBC 19.6- up from 14k-    11/8- WBC back down to her "baseline" of 14.2- 11/14- WBC down to 11.7- doing better-   22. Hemorrhoids  11/7- adding Anusol supp after dinner at 6pm daily.  23. Hypocalcemia  11/8- will monitor  11/14- Ca 7.6- will add Ca carbonate  11/21- Ca 7.7- will increase Ca carbonate 24. Itching  11/17- will add steroid cream for back BID 25. Thrombocytopenia  11/20- plts down to 72k- down from 124k, down from 200k- will stop Lovenox- esp since plts not doing great AND is walking >200 ft usually.   11/21- Plts down to 57k- off lovenox will consult Heme/Onc.  26. N/V- UTI  11/20- will give IVFs and anti nausea medicine- will check U/A to make sure- no dysuria however.   11/21- has UTI- moderate leukos and 21-50 WBCs- last U/Cx showed Proteus was sensitive to Keflex- will start 500 mg q12 hours for 5 days- per d/w pharmacy  I spent a total of 52   minutes on total care today- >50% coordination of care- due to education that has UTI- needs to drink 6-8 cups/water/day- can come off IVFs for family education- team conference today to finalize d/c. D/w pharmacy as well as nursing Heme/Onc consult  LOS: 19 days A FACE TO FACE EVALUATION WAS PERFORMED  Janice Short 10/27/2022, 8:55 AM

## 2022-10-27 NOTE — Progress Notes (Signed)
Patient ID: Janice Short, female   DOB: 03/26/1961, 61 y.o.   MRN: [id number]  This SW covering for primary SW, Becky Dupree.    SW met with pt and her sisters yesterday to provide updates from team conference, and confirm d/c date tomorrow. She has all DME in room (3in1 BSC, RW, and TTB). Confirms she will need transportation home tomorrow. SW indicated will arrange tomorrow morning. Pt appeared to be confused with some questions asked, and her sisters helped give clarity. Pt will d/c to her home address.   Loralee Pacas, MSW, [place] Office: [phone] Cell: [phone] Fax: [phone]

## 2022-10-27 NOTE — Discharge Summary (Signed)
Physician Discharge Summary  Patient ID: [name] MRN: [id number] DOB/AGE: 1961/08/29 61 y.o.  Admit date: 10/08/2022 Discharge date: 10/28/2022  Discharge Diagnoses:  Principal Problem:   Acute incomplete quadriplegia ([place]) Active Problems:   Uncontrolled type 2 diabetes mellitus with hyperglycemia, with long-term current use of insulin (HCC)   Intrahepatic cholangiocarcinoma (HCC)   Central cord syndrome (HCC)   Neurogenic bowel   Pressure ulcer, stage 3 (HCC)   UTI (urinary tract infection)   Hypocalcemia   Discharged Condition: stable  Significant Diagnostic Studies: DG Chest 2 View  Result Date: 10/12/2022 CLINICAL DATA:  Leukocytosis EXAM: CHEST - 2 VIEW COMPARISON:  Chest x-ray October 07, 2022 FINDINGS: Right chest wall port catheter terminates in the right atrium. The cardiomediastinal silhouette is unchanged in contour. Right basilar bandlike opacity. No pleural effusion or pneumothorax. The visualized upper abdomen is unremarkable. No acute osseous abnormality. IMPRESSION: Right basilar bandlike opacity, likely atelectasis. However, aspiration/infection could appear similarly in the appropriate clinical context. Electronically Signed   By: Beryle Flock M.D.   On: 10/12/2022 15:37   US Venous Img Lower Bilateral (DVT)  Result Date: 10/07/2022 CLINICAL DATA:  Injury, fall, fever EXAM: BILATERAL LOWER EXTREMITY VENOUS DOPPLER ULTRASOUND TECHNIQUE: Gray-scale sonography with graded compression, as well as color Doppler and duplex ultrasound were performed to evaluate the lower extremity deep venous systems from the level of the common femoral vein and including the common femoral, femoral, profunda femoral, popliteal and calf veins including the posterior tibial, peroneal and gastrocnemius veins when visible. Spectral Doppler was utilized to evaluate flow at rest and with distal augmentation maneuvers in the common femoral, femoral and popliteal veins. COMPARISON:  None  Available. FINDINGS: RIGHT LOWER EXTREMITY Common Femoral Vein: No evidence of thrombus. Normal compressibility, respiratory phasicity and response to augmentation. Saphenofemoral Junction: No evidence of thrombus. Normal compressibility and flow on color Doppler imaging. Profunda Femoral Vein: No evidence of thrombus. Normal compressibility and flow on color Doppler imaging. Femoral Vein: No evidence of thrombus. Normal compressibility, respiratory phasicity and response to augmentation. Popliteal Vein: No evidence of thrombus. Normal compressibility, respiratory phasicity and response to augmentation. Calf Veins: No evidence of thrombus. Normal compressibility and flow on color Doppler imaging. LEFT LOWER EXTREMITY Common Femoral Vein: No evidence of thrombus. Normal compressibility, respiratory phasicity and response to augmentation. Saphenofemoral Junction: No evidence of thrombus. Normal compressibility and flow on color Doppler imaging. Profunda Femoral Vein: No evidence of thrombus. Normal compressibility and flow on color Doppler imaging. Femoral Vein: No evidence of thrombus. Normal compressibility, respiratory phasicity and response to augmentation. Popliteal Vein: No evidence of thrombus. Normal compressibility, respiratory phasicity and response to augmentation. Calf Veins: No evidence of thrombus. Normal compressibility and flow on color Doppler imaging. IMPRESSION: No evidence of deep venous thrombosis in either lower extremity. Electronically Signed   By: Jerilynn Mages.  Shick M.D.   On: 10/07/2022 18:27   DG Chest 2 View  Result Date: 10/07/2022 CLINICAL DATA:  Fever EXAM: CHEST - 2 VIEW COMPARISON:  10/01/2022 FINDINGS: RIGHT jugular Port-A-Cath with tip projecting over SVC. Normal heart size, mediastinal contours, and pulmonary vascularity. Atherosclerotic calcification aorta. Lungs clear. No infiltrate, pleural effusion, or pneumothorax. Motion artifacts degrade lateral view. Bones demineralized.  IMPRESSION: No acute abnormalities. Aortic Atherosclerosis (ICD10-I70.0). Electronically Signed   By: Lavonia Dana M.D.   On: 10/07/2022 17:49   DG Cervical Spine 2-3 Views  Result Date: 10/02/2022 CLINICAL DATA:  Provided history: Anterior cervical decompression/discectomy with fusion of C4-C5. Provided fluoroscopy time: 2  seconds EXAM: CERVICAL SPINE - 2-3 VIEW COMPARISON:  Cervical spine MRI 10/01/2022. Cervical spine CT 10/01/2022. FINDINGS: Three intraoperative fluoroscopic images of the cervical spine are submitted. On the initial lateral projection fluoroscopic image, a metallic surgical instrument projects anterior to the C4-C5 disc space. On the subsequent fluoroscopic images acquired at 9:23 a.m., ACDF hardware and an interbody device are present at the C4-C5 level. A known acute fracture of the C2 body was better appreciated on the prior cervical spine CT of 10/01/2022. Partially imaged ET tube. IMPRESSION: Three intraoperative fluoroscopic images of the cervical spine from C4-C5 ACDF, as described. Electronically Signed   By: Kellie Simmering D.O.   On: 10/02/2022 09:47   DG C-Arm 1-60 Min-No Report  Result Date: 10/02/2022 Fluoroscopy was utilized by the requesting physician.  No radiographic interpretation.   MR Cervical Spine Wo Contrast  Result Date: 10/01/2022 CLINICAL DATA:  Neck trauma.  Fall EXAM: MRI CERVICAL SPINE WITHOUT CONTRAST TECHNIQUE: Multiplanar, multisequence MR imaging of the cervical spine was performed. No intravenous contrast was administered. COMPARISON:  CT cervical spine 10/03/2022 FINDINGS: Alignment: Mild anterolisthesis C3-4. Straightening of the cervical lordosis Vertebrae: No fracture or bone marrow edema. Small cortical fracture of the anterior inferior C2 vertebral body is seen on CT but not appreciated on MRI. Cord: Bilateral cord hyperintensity at C4-5 which appears chronic and related to compressive myelopathy. No other cord signal abnormality Posterior  Fossa, vertebral arteries, paraspinal tissues: Negative. No prevertebral soft tissue swelling or edema. Disc levels: C2-3: Negative for stenosis C3-4: Small central disc protrusion with calcification. Mild cord flattening and mild spinal stenosis. Mild foraminal narrowing bilaterally due to spurring. C4-5: Moderately large central disc protrusion with associated spurring. Cord flattening with moderate spinal stenosis. Small cord hyperintensity bilaterally which appears chronic. Moderate foraminal narrowing bilaterally due to spurring C5-6: Mild disc degeneration and spurring. Mild foraminal narrowing bilaterally. Mild central canal stenosis. C6-7: Mild disc degeneration. Moderate left foraminal narrowing due to spurring C7-T1: Negative IMPRESSION: 1. Nondisplaced fracture anterior inferior body of C2 not appreciated by MRI. No other fracture. No epidural hematoma. 2. Multilevel degenerative change most prominent at C4-5. Moderate spinal stenosis at C4-5 with bilateral cord hyperintensity compatible with chronic compressive myelopathy. Electronically Signed   By: Franchot Gallo M.D.   On: 10/01/2022 20:17   CT Head Wo Contrast  Result Date: 10/01/2022 CLINICAL DATA:  Fall with laceration to right cheek EXAM: CT HEAD WITHOUT CONTRAST CT MAXILLOFACIAL WITHOUT CONTRAST CT CERVICAL SPINE WITHOUT CONTRAST TECHNIQUE: Multidetector CT imaging of the head, cervical spine, and maxillofacial structures were performed using the standard protocol without intravenous contrast. Multiplanar CT image reconstructions of the cervical spine and maxillofacial structures were also generated. RADIATION DOSE REDUCTION: This exam was performed according to the departmental dose-optimization program which includes automated exposure control, adjustment of the mA and/or kV according to patient size and/or use of iterative reconstruction technique. COMPARISON:  CT 08/30/2021 FINDINGS: CT HEAD FINDINGS Brain: No acute territorial  infarction, hemorrhage or intracranial mass. Patchy white matter hypodensity likely chronic small vessel ischemic change. The ventricles are nonenlarged. Vascular: No hyperdense vessels.  Carotid vascular calcification Skull: Normal. Negative for fracture or focal lesion. Other: Opacified right maxillary sinus. Mucosal thickening in the ethmoid sinuses CT MAXILLOFACIAL FINDINGS Osseous: Mastoid air cells are clear. Mandibular heads are normally position. No mandibular fracture. Pterygoid plates and zygomatic arches appear intact. No acute nasal bone fracture Orbits: Negative. No traumatic or inflammatory finding. Sinuses: Completely opacified right maxillary sinus. Moderate mucosal thickening  in the ethmoid sinuses. No sinus wall fracture. Soft tissues: Moderate to large right periorbital and infraorbital soft tissue hematoma with small foci of gas consistent with laceration CT CERVICAL SPINE FINDINGS Alignment: Straightening of the cervical spine. No subluxation. Facet alignment within normal limits. Skull base and vertebrae: Vertebral body heights are maintained. Suspected nondisplaced fracture lucency involving the anterior inferior corner of the C2 vertebral body, best seen on sagittal images. Correlate is seen on axial series 9, image 28. No other discrete fractures are visualized. Soft tissues and spinal canal: No significant prevertebral soft tissue enlargement. Disc levels: No abnormal widening or narrowing of C2-C3 disc space. There are multilevel degenerative changes. Moderate severe disc space narrowing and degenerative change C4-C5, C5-C6, C6-C7 and C7-T1. Facet degenerative changes at multiple levels with foraminal narrowing Upper chest: Negative. Other: None IMPRESSION: 1. No CT evidence for acute intracranial abnormality. Patchy white matter hypodensity likely chronic small vessel ischemic change. 2. Findings consistent with nondisplaced teardrop type fracture involving the anterior inferior aspect of  the C2 vertebral body. 3. Multilevel moderate severe degenerative changes Critical Value/emergent results were called by telephone at the time of interpretation on 10/01/2022 at 5:56 pm to provider [facility] , who verbally acknowledged these results. Electronically Signed   By: Donavan Foil M.D.   On: 10/01/2022 17:56   CT Cervical Spine Wo Contrast  Result Date: 10/01/2022 CLINICAL DATA:  Fall with laceration to right cheek EXAM: CT HEAD WITHOUT CONTRAST CT MAXILLOFACIAL WITHOUT CONTRAST CT CERVICAL SPINE WITHOUT CONTRAST TECHNIQUE: Multidetector CT imaging of the head, cervical spine, and maxillofacial structures were performed using the standard protocol without intravenous contrast. Multiplanar CT image reconstructions of the cervical spine and maxillofacial structures were also generated. RADIATION DOSE REDUCTION: This exam was performed according to the departmental dose-optimization program which includes automated exposure control, adjustment of the mA and/or kV according to patient size and/or use of iterative reconstruction technique. COMPARISON:  CT 08/30/2021 FINDINGS: CT HEAD FINDINGS Brain: No acute territorial infarction, hemorrhage or intracranial mass. Patchy white matter hypodensity likely chronic small vessel ischemic change. The ventricles are nonenlarged. Vascular: No hyperdense vessels.  Carotid vascular calcification Skull: Normal. Negative for fracture or focal lesion. Other: Opacified right maxillary sinus. Mucosal thickening in the ethmoid sinuses CT MAXILLOFACIAL FINDINGS Osseous: Mastoid air cells are clear. Mandibular heads are normally position. No mandibular fracture. Pterygoid plates and zygomatic arches appear intact. No acute nasal bone fracture Orbits: Negative. No traumatic or inflammatory finding. Sinuses: Completely opacified right maxillary sinus. Moderate mucosal thickening in the ethmoid sinuses. No sinus wall fracture. Soft tissues: Moderate to large right  periorbital and infraorbital soft tissue hematoma with small foci of gas consistent with laceration CT CERVICAL SPINE FINDINGS Alignment: Straightening of the cervical spine. No subluxation. Facet alignment within normal limits. Skull base and vertebrae: Vertebral body heights are maintained. Suspected nondisplaced fracture lucency involving the anterior inferior corner of the C2 vertebral body, best seen on sagittal images. Correlate is seen on axial series 9, image 28. No other discrete fractures are visualized. Soft tissues and spinal canal: No significant prevertebral soft tissue enlargement. Disc levels: No abnormal widening or narrowing of C2-C3 disc space. There are multilevel degenerative changes. Moderate severe disc space narrowing and degenerative change C4-C5, C5-C6, C6-C7 and C7-T1. Facet degenerative changes at multiple levels with foraminal narrowing Upper chest: Negative. Other: None IMPRESSION: 1. No CT evidence for acute intracranial abnormality. Patchy white matter hypodensity likely chronic small vessel ischemic change. 2. Findings consistent with nondisplaced  teardrop type fracture involving the anterior inferior aspect of the C2 vertebral body. 3. Multilevel moderate severe degenerative changes Critical Value/emergent results were called by telephone at the time of interpretation on 10/01/2022 at 5:56 pm to provider [facility] , who verbally acknowledged these results. Electronically Signed   By: Donavan Foil M.D.   On: 10/01/2022 17:56   CT Maxillofacial WO CM  Result Date: 10/01/2022 CLINICAL DATA:  Fall with laceration to right cheek EXAM: CT HEAD WITHOUT CONTRAST CT MAXILLOFACIAL WITHOUT CONTRAST CT CERVICAL SPINE WITHOUT CONTRAST TECHNIQUE: Multidetector CT imaging of the head, cervical spine, and maxillofacial structures were performed using the standard protocol without intravenous contrast. Multiplanar CT image reconstructions of the cervical spine and maxillofacial structures  were also generated. RADIATION DOSE REDUCTION: This exam was performed according to the departmental dose-optimization program which includes automated exposure control, adjustment of the mA and/or kV according to patient size and/or use of iterative reconstruction technique. COMPARISON:  CT 08/30/2021 FINDINGS: CT HEAD FINDINGS Brain: No acute territorial infarction, hemorrhage or intracranial mass. Patchy white matter hypodensity likely chronic small vessel ischemic change. The ventricles are nonenlarged. Vascular: No hyperdense vessels.  Carotid vascular calcification Skull: Normal. Negative for fracture or focal lesion. Other: Opacified right maxillary sinus. Mucosal thickening in the ethmoid sinuses CT MAXILLOFACIAL FINDINGS Osseous: Mastoid air cells are clear. Mandibular heads are normally position. No mandibular fracture. Pterygoid plates and zygomatic arches appear intact. No acute nasal bone fracture Orbits: Negative. No traumatic or inflammatory finding. Sinuses: Completely opacified right maxillary sinus. Moderate mucosal thickening in the ethmoid sinuses. No sinus wall fracture. Soft tissues: Moderate to large right periorbital and infraorbital soft tissue hematoma with small foci of gas consistent with laceration CT CERVICAL SPINE FINDINGS Alignment: Straightening of the cervical spine. No subluxation. Facet alignment within normal limits. Skull base and vertebrae: Vertebral body heights are maintained. Suspected nondisplaced fracture lucency involving the anterior inferior corner of the C2 vertebral body, best seen on sagittal images. Correlate is seen on axial series 9, image 28. No other discrete fractures are visualized. Soft tissues and spinal canal: No significant prevertebral soft tissue enlargement. Disc levels: No abnormal widening or narrowing of C2-C3 disc space. There are multilevel degenerative changes. Moderate severe disc space narrowing and degenerative change C4-C5, C5-C6, C6-C7 and  C7-T1. Facet degenerative changes at multiple levels with foraminal narrowing Upper chest: Negative. Other: None IMPRESSION: 1. No CT evidence for acute intracranial abnormality. Patchy white matter hypodensity likely chronic small vessel ischemic change. 2. Findings consistent with nondisplaced teardrop type fracture involving the anterior inferior aspect of the C2 vertebral body. 3. Multilevel moderate severe degenerative changes Critical Value/emergent results were called by telephone at the time of interpretation on 10/01/2022 at 5:56 pm to provider [facility] , who verbally acknowledged these results. Electronically Signed   By: Donavan Foil M.D.   On: 10/01/2022 17:56   DG Chest 1 View  Result Date: 10/01/2022 CLINICAL DATA:  [place] EXAM: CHEST  1 VIEW COMPARISON:  08/30/2021 FINDINGS: Single frontal view of the chest demonstrates a right chest wall port via internal jugular approach, tip overlying the atriocaval junction. Cardiac silhouette is unremarkable. No acute airspace disease, effusion, or pneumothorax. There are no acute bony abnormalities. IMPRESSION: 1. No acute intrathoracic process. Electronically Signed   By: Randa Ngo M.D.   On: 10/01/2022 17:54   DG Knee Complete 4 Views Left  Result Date: 10/01/2022 CLINICAL DATA:  [place], left leg deformity EXAM: LEFT KNEE - COMPLETE 4+ VIEW  COMPARISON:  12/03/2016 FINDINGS: Frontal, bilateral oblique, and cross-table lateral views of the left knee are obtained. No acute displaced fracture, subluxation, or dislocation. Moderate 3 compartmental osteoarthritis greatest in the patellofemoral compartment. Synovial osteochondromatosis again noted. Small joint effusion. Prepatellar soft tissue edema. IMPRESSION: 1. No acute displaced fracture. 2. Moderate osteoarthritis with likely reactive joint effusion. 3. Prepatellar soft tissue edema. Electronically Signed   By: Randa Ngo M.D.   On: 10/01/2022 17:53   DG Hip Unilat With Pelvis 2-3 Views  Left  Result Date: 10/01/2022 CLINICAL DATA:  [place], left leg deformity EXAM: DG HIP (WITH OR WITHOUT PELVIS) 2-3V LEFT COMPARISON:  None Available. FINDINGS: Frontal view of the pelvis as well as a frontal and cross-table lateral view of the left hip are obtained. There is internal rotation of the left hip, with no acute fracture, subluxation, or dislocation. Symmetrical bilateral hip osteoarthritis. Soft tissues are unremarkable. Sacroiliac joints are normal. IMPRESSION: 1. No acute displaced fracture. If fracture remains a clinical concern, CT or MRI be performed. 2. Symmetrical bilateral hip osteoarthritis. Electronically Signed   By: Randa Ngo M.D.   On: 10/01/2022 17:52   CT HIP LEFT WO CONTRAST  Result Date: 10/01/2022 CLINICAL DATA:  Blunt facial trauma, fall striking bed rail. Shortening of left leg. EXAM: CT OF THE LEFT HIP WITHOUT CONTRAST TECHNIQUE: Multidetector CT imaging of the left hip was performed according to the standard protocol. Multiplanar CT image reconstructions were also generated. RADIATION DOSE REDUCTION: This exam was performed according to the departmental dose-optimization program which includes automated exposure control, adjustment of the mA and/or kV according to patient size and/or use of iterative reconstruction technique. COMPARISON:  Radiographs 10/01/2022 FINDINGS: Bones/Joint/Cartilage Moderate to prominent loss of articular space in the left hip with marginal spurring along the acetabulum and femoral head. No fracture or acute bony findings identified. Ligaments Suboptimally assessed by CT. Muscles and Tendons Unremarkable Soft tissues Arterial atherosclerosis. IMPRESSION: 1. No fracture or acute bony findings. 2. Moderate to prominent left hip osteoarthritis. 3. Arterial atherosclerosis. Electronically Signed   By: Van Clines M.D.   On: 10/01/2022 17:48    Labs:  Basic Metabolic Panel: Recent Labs  Lab 10/26/22 0453 10/27/22 0909 10/28/22 0602   NA 137 135 136  K 4.3 4.6 4.3  CL 112* 111 111  CO2 19* 18* 19*  GLUCOSE 105* 272* 73  BUN _0 CREATININE 1.72* 1.61* 1.55*  CALCIUM 7.7* 7.7* 8.0*    CBC: Recent Labs  Lab 10/26/22 0453 10/27/22 0558 10/28/22 0602  WBC 10.1 8.4 9.8  NEUTROABS 6.8 5.1 5.7  HGB 8.3* 8.2* 9.1*  HCT 25.7* 25.1* 27.5*  MCV 95.9 94.4 93.9  PLT 72* 57* 80*    CBG: Recent Labs  Lab 10/27/22 1112 10/27/22 1648 10/27/22 2043 10/28/22 0646 10/28/22 1126  GLUCAP 199* 119* 201* 90 212*    Brief HPI:   Janice Short is a 61 y.o. right-handed female with history of diabetes mellitus CKD stage III chronic anemia chronic bronchitis intrahepatic cholangiocarcinoma on chemotherapy last infused 09/18/2022 followed by Dr.Rao, hypertension hyperlipidemia gout obesity with BMI 27.62 tobacco abuse.  Per chart review lives with her daughter.  Independent prior to admission.  [facility]ed to [facility] 10/01/2022 following mechanical fall when she slipped fell backwards striking her face on a railing.  She was unable to get up.  She sustained laceration left cheek complained of left hip pain and bilateral extremity with numbness.  Admission chemistries unremarkable except potassium 5.7  creatinine 1.30.  Laceration to cheek was sutured in the ED.  Cranial CT scan negative.  CT maxillofacial negative.  CT left hip negative.  CT cervical spine consistent with nondisplaced teardrop type fracture involving the anterior inferior aspect of C2 vertebral body as well as severe stenosis C4-5 with myelomalacia.  Neurosurgery consulted underwent anterior cervical discectomy decompression fusion C4-5 10/02/2022 per Dr. Cari Caraway.  Hard cervical collar applied.  Hospital course AKI on CKD stage III baseline serum creatinine 1.18-1.26.  Noted hyperkalemia responded well to [facility].  Acute on chronic anemia latest hemoglobin 9.6.  She was cleared to begin Lovenox for DVT prophylaxis.  Spiked a low-grade fever 101.1 10/07/2022 venous Doppler  studies negative chest x-ray negative urinalysis negative nitrite initially placed on empiric antibiotics changed to Keflex.  Therapy evaluations completed due to patient decreased functional mobility was admitted for a comprehensive rehab program.   Hospital Course: Janice Short was admitted to rehab 10/08/2022 for inpatient therapies to consist of PT, ST and OT at least three hours five days a week. Past admission physiatrist, therapy team and rehab RN have worked together to provide customized collaborative inpatient rehab.  Pertaining to patient's C4 [place] quadriplegia C2 fracture severe stenosis C4-5 myelomalacia status post ACDF 10/02/2022.  Cervical collar when out of bed she would follow-up neurosurgery.  Pain managed use of Neurontin scheduled as well as Robaxin oxycodone.  Blood sugars controlled monitored full diabetic teaching completed.  AKI on CKD with latest creatinine 1.55.  Acute on chronic anemia no bleeding episodes.  Intrahepatic cholangiocarcinoma chemotherapy followed by Dr.Rao.  History of tobacco use receiving counts regards to cessation of nicotine products.  Neurogenic bladder complicated UTI completing antibiotic course.  Neurogenic bowel with full teaching completed.  Stage III coccyx pressure injury Aquacel with foam dressing change daily.   Blood pressures were monitored on TID basis and soft and monitored  Diabetes has been monitored with ac/hs CBG checks and SSI was use prn for tighter BS control.    Rehab course: During patient's stay in rehab weekly team conferences were held to monitor patient's progress, set goals and discuss barriers to discharge. At admission, patient required max assist sit to stand max assist sit to supine  Physical exam.  Blood pressure 118/58 pulse 100 temperature 99 respirations 18 oxygen saturations 99% room air Constitutional.  No acute distress HEENT Head.  Normocephalic and atraumatic Eyes.  Pupils round and reactive to light Neck.   Cervical collar in place Cardiac regular rate and rhythm without any extra sounds or murmur heard Abdomen.  Soft nontender positive bowel sounds without rebound Respiratory effort normal no respiratory distress without wheeze Musculoskeletal. Right upper extremity biceps 4+/5 triceps 4/5W EE 4 -/5 grip 4 -/5 FA 4 -/5 Left upper extremity biceps 4 -/5W EE 3/5, triceps 2/5, grip to minus/5 and FA 1/5 Right extremity hip flexors, knee extension 4/5 dorsi plantarflexion and EHL 4+/5 Left lower extremity DF/PF 4/5 and EHL 4 -/5   She  has had improvement in activity tolerance, balance, postural control as well as ability to compensate for deficits. She is able to complete BADL tasks at  modified independent level and with supervision.  She requires supervision for transfers and to ambulate 200' with cues and RW.      Disposition:  Home  Diet: Carb modified.   Special Instructions: No driving smoking or alcohol  Discharge Instructions     Ambulatory referral to Physical Medicine Rehab   Complete by: As directed  Moderate complexity follow-up 1 to 2 weeks central cord syndrome      Allergies as of 10/28/2022       Reactions   Other Shortness Of Breath   Cat dander - shortness of breath Grass pollen - shortness of breath   Platinol [cisplatin] Itching   Bydureon [exenatide] Other (See Comments)   Too painful.        Medication List     STOP taking these medications    aspirin 81 MG tablet   blood glucose meter kit and supplies   diphenhydramine-acetaminophen 25-500 MG Tabs tablet Commonly known as: TYLENOL PM   insulin aspart 100 UNIT/ML injection Commonly known as: novoLOG       TAKE these medications    albuterol 108 (90 Base) MCG/ACT inhaler Commonly known as: VENTOLIN HFA INHALE TWO PUFFS BY MOUTH EVERY 6 HOURS AS NEEDED FOR WHEEZING OR FOR SHORTNESS OF BREATH (BULK)   amLODipine 2.5 MG tablet Commonly known as: NORVASC Take 1 tablet (2.5 mg total) by  mouth daily.   atorvastatin 40 MG tablet Commonly known as: LIPITOR Take 1 tablet (40 mg total) by mouth daily.   calcium carbonate 1500 (600 Ca) MG Tabs tablet Commonly known as: OSCAL Take 1 tablet (1,500 mg total) by mouth 3 (three) times daily with meals.   cephALEXin 500 MG capsule Commonly known as: KEFLEX Take 1 capsule (500 mg total) by mouth every 12 (twelve) hours. What changed: when to take this   diclofenac Sodium 1 % Gel Commonly known as: VOLTAREN Apply 2 g topically 4 (four) times daily.   escitalopram 20 MG tablet Commonly known as: LEXAPRO TAKE ONE TABLET BY MOUTH DAILY AT 9AM   fluticasone 50 MCG/ACT nasal spray Commonly known as: FLONASE Place 2 sprays into both nostrils daily as needed. USE 2 SPRAY(S) IN EACH NOSTRIL AT BEDTIME   gabapentin 300 MG capsule Commonly known as: NEURONTIN TAKE TWO CAPSULES BY MOUTH TWICE DAILY @ 9AM & 5PM   hydrocortisone cream 1 % Apply topically 2 (two) times daily.   Insulin Pen Needle 32G X 6 MM Misc 1 each by Does not apply route at bedtime. What changed: when to take this   lidocaine-prilocaine cream Commonly known as: EMLA Apply 1 Application topically as needed. Apply to port and cover with saran wrap 1-2 hours prior to port access   methocarbamol 500 MG tablet Commonly known as: ROBAXIN Take 1 tablet (500 mg total) by mouth 4 (four) times daily.   ondansetron 4 MG tablet Commonly known as: ZOFRAN Take 1 tablet (4 mg total) by mouth every 6 (six) hours as needed for nausea.   oxyCODONE 5 MG immediate release tablet--Rx# 28 pills Commonly known as: Oxy IR/ROXICODONE Take 1 tablet (5 mg total) by mouth every 6 (six) hours as needed for moderate pain.   senna-docusate 8.6-50 MG tablet Commonly known as: Senokot-S Take 1 tablet by mouth daily.   Trelegy Ellipta 100-62.5-25 MCG/ACT Aepb Generic drug: Fluticasone-Umeclidin-Vilant Inhale 1 puff into the lungs daily.   Tyler Aas FlexTouch 100 UNIT/ML  FlexTouch Pen Generic drug: insulin degludec Inject 11 Units into the skin at bedtime. What changed:  how much to take when to take this   witch hazel-glycerin pad Commonly known as: TUCKS Apply topically as needed for itching.   Zinc Sulfate 220 (50 Zn) MG Tabs Take 1 tablet (220 mg total) by mouth daily.        Follow-up Information     Lovorn, Jinny Blossom, MD Follow up.  Specialty: Physical Medicine and Rehabilitation Why: Office to call for appointment Contact information: [address] [place] [address] [phone]         Sindy Guadeloupe, MD Follow up on 11/10/2022.   Specialty: Oncology Why: Appointment--lab at 8:45/MD visit at 9 am Contact information: [place] [address] [phone]         Meade Maw, MD Follow up on 11/12/2022.   Specialty: Neurosurgery Why: Appointment at 4:14 pm Contact information: [address] [place] [address] [phone]         Steele Sizer, MD Follow up on 11/16/2022.   Specialty: Family Medicine Why: Appointment at 1:40 pm Contact information: [address] [place] [address] [phone]                 Signed: Lavon Paganini [place] 10/31/2022, 11:57 AM

## 2022-10-27 NOTE — Progress Notes (Signed)
Occupational Therapy Discharge Summary  Patient Details  Name: Janice Short MRN: [id number] Date of Birth: 10-07-1961  Date of Discharge from OT service:October 27, 2022  Today's Date: 10/27/2022 OT Individual Time: 1st Session 800-900, 2nd session 1300-1330 OT Individual Time Calculation (min): 60 min, 30 min   Patient has met 10 of 10 long term goals due to improved activity tolerance, improved balance, postural control, ability to compensate for deficits, functional use of  RIGHT upper, RIGHT lower, LEFT upper, and LEFT lower extremity, and improved coordination.  Patient to discharge at overall Modified Independent level for BADL's and simple transfers with RW. S for IADL's. Patient's care partner is independent to provide the necessary physical assistance at discharge.    Reasons goals not met: n/a   Recommendation:  Patient will benefit from ongoing skilled OT services in home health setting to continue to advance functional skills in the area of BADL, iADL, and Reduce care partner burden.  Equipment: Reacher, sock aide, LH sponge, TTB, 3 in 1 commode, RW   Reasons for discharge: treatment goals met  Patient/family agrees with progress made and goals achieved: Yes  OT Discharge Precautions/Restrictions    General   Vital Signs Therapy Vitals Temp: 98 F (36.7 C) Temp Source: Oral Pulse Rate: 91 Resp: 16 BP: 107/61 Patient Position (if appropriate): Lying Oxygen Therapy SpO2: 100 % O2 Device: Room Air Pain   ADL ADL Equipment Provided: Sock aid, Long-handled sponge Eating: Independent Where Assessed-Eating: Chair Grooming: Modified independent Where Assessed-Grooming: Standing at sink, Sitting at sink Upper Body Bathing: Modified independent Where Assessed-Upper Body Bathing: Standing at sink, Sitting at sink, Shower Lower Body Bathing: Modified independent Where Assessed-Lower Body Bathing: Standing at sink, Sitting at sink Upper Body Dressing: Modified  independent (Device) Where Assessed-Upper Body Dressing: Sitting at sink Lower Body Dressing: Modified independent Where Assessed-Lower Body Dressing: Standing at sink, Sitting at sink Toileting: Modified independent Where Assessed-Toileting: Glass blower/designer: Modified Programmer, applications Method: Arts development officer: Bedside commode, Grab bars Tub/Shower Transfer: Modified independent Clinical cytogeneticist Method: Librarian, academic: Facilities manager: Modified independent Social research officer, government Method: Radiographer, therapeutic: Shower seat with back ADL Comments: Pt mod I for all BADL's and basic transfers to toilet and tub shower transfer. Vision Baseline Vision/History: 0 No visual deficits Patient Visual Report: No change from baseline Perception  Perception: Within Functional Limits Praxis Praxis: Intact Cognition Cognition Overall Cognitive Status: Within Functional Limits for tasks assessed Arousal/Alertness: Awake/alert Orientation Level: Place;Person;Situation Memory: Appears intact Awareness: Appears intact Problem Solving: Appears intact Safety/Judgment: Appears intact Brief Interview for Mental Status (BIMS) Repetition of Three Words (First Attempt): 3 Temporal Orientation: Year: Correct Temporal Orientation: Month: Accurate within 5 days Temporal Orientation: Day: Correct Recall: "Sock": Yes, no cue required Recall: "Blue": Yes, no cue required Recall: "Bed": Yes, no cue required BIMS Summary Score: 15 Sensation Sensation Light Touch: Appears Intact Hot/Cold: Appears Intact Proprioception: Appears Intact Stereognosis: Appears Intact Coordination Gross Motor Movements are Fluid and Coordinated: Yes Fine Motor Movements are Fluid and Coordinated: No Coordination and Movement Description: improved gross coordination and balance with mobility Finger Nose Finger Test: mild  dysmetria worse on left compared to right Heel Shin Test: intact bilaterally 9 Hole Peg Test: see previous test Motor  Motor Motor: Tetraplegia Motor - Skilled Clinical Observations: grossly coordinated due to improvements in strength, balance, coordination Mobility  Bed Mobility Bed Mobility: Rolling Right;Rolling Left;Sit to Supine;Supine to Sit Rolling Right:  Independent Rolling Left: Independent Left Sidelying to Sit: Independent Supine to Sit: Independent Sit to Supine: Independent Transfers Sit to Stand: Independent with assistive device Stand to Sit: Independent with assistive device  Trunk/Postural Assessment  Cervical Assessment Cervical Assessment: Exceptions to [facility] Cervical AROM Overall Cervical AROM: Due to precautions Thoracic Assessment Thoracic Assessment: Exceptions to [facility] Thoracic AROM Overall Thoracic AROM: Due to precautions Lumbar Assessment Lumbar Assessment: Exceptions to [facility] Lumbar AROM Overall Lumbar AROM: Due to precautions Postural Control Postural Control: Deficits on evaluation Righting Reactions: delayed Protective Responses: delayed  Balance Balance Balance Assessed: Yes Dynamic Sitting Balance Dynamic Sitting - Balance Support: Feet supported Dynamic Sitting - Level of Assistance: 7: Independent Dynamic Sitting - Balance Activities: Lateral lean/weight shifting;Forward lean/weight shifting Static Standing Balance Static Standing - Balance Support: During functional activity;Bilateral upper extremity supported Static Standing - Level of Assistance: 6: Modified independent (Device/Increase time) Dynamic Standing Balance Dynamic Standing - Balance Support: During functional activity;Bilateral upper extremity supported Dynamic Standing - Balance Activities: Lateral lean/weight shifting;Reaching for objects;Forward lean/weight shifting Extremity/Trunk Assessment RUE Assessment RUE Assessment: Exceptions to [facility] General Strength Comments: R  UE grossly 4/5   1st Session:   OT session this am to complete all functional self care retraining with bathing, dressing, grooming and toilet and shower stall transfers.Pt was able to gather all clothing and ADL supplies with Rw and RW bag with mod I.  Pt demonstrating mod I after instruction with AE sink side level seated and standing. Pt reported no pain this session. All MMT, sensation, coordination, visual/percept/cognitive status reassessed. Team Conference later am and then Family Educ to be completed later day for completion of OT inpt rehab services.   2nd Session:  Pt seen for Family educ session and final OT training in preparation for discharge from CIR tomorrow. Pt transported for time management purposes to demo tub shower room. Sisters present for training. Pt able to demonstrate amb with mod I and access TTB in tub shower with mod I. OT instructed on falls prevention and safety with + teach back. OT completed written UE HEP training, AE education. No further needs at this time with HHOT rec and all DME provided.    Barnabas Lister 10/27/2022, 10:38 PM

## 2022-10-27 NOTE — Progress Notes (Addendum)
Inpatient Rehabilitation Care Coordinator Discharge Note   Patient Details  Name: Janice Short MRN: [id number] Date of Birth: 1961/01/20   Discharge location: Home with daughter, granddaughter and grandson  Length of Stay: 20 Days  Discharge activity level: Min a  Home/community participation: Presenter, broadcasting and grand children  Patient response WH:QPRFFM Literacy - How often do you need to have someone help you when you read instructions, pamphlets, or other written material from your doctor or pharmacy?: Never  Patient response BW:GYKZLD Isolation - How often do you feel lonely or isolated from those around you?: Never  Services provided included: SW, Pharmacy, TR, CM, RN, SLP, OT, PT, RD, MD, Neuropsych  Financial Services:  Charity fundraiser Utilized: [place] offered to/list presented to: patient and daughter  Follow-up services arranged:  [place]: [place]         Patient response to transportation need: Is the patient able to respond to transportation needs?: Yes In the past 12 months, has lack of transportation kept you from medical appointments or from getting medications?: No In the past 12 months, has lack of transportation kept you from meetings, work, or from getting things needed for daily living?: No    Comments (or additional information):  Patient/Family verbalized understanding of follow-up arrangements:  Yes  Individual responsible for coordination of the follow-up plan: Mendel Ryder  Confirmed correct DME delivered: Dyanne Iha 10/27/2022    Dyanne Iha

## 2022-10-27 NOTE — Progress Notes (Signed)
Physical Therapy Discharge Summary  Patient Details  Name: Janice Short MRN: [id number] Date of Birth: 1961-08-06  Date of Discharge from PT service:November 83, 2023  Today's Date: 10/27/2022 PT Individual Time: 0945-1030, 1331-1401, 1450-1530 PT Individual Time Calculation (min): 45 min , 30 min, 40 min    Patient has met 10 of 10 long term goals due to improved activity tolerance, improved balance, improved postural control, increased strength, increased range of motion, decreased pain, ability to compensate for deficits, and improved coordination.  Patient to discharge at an ambulatory level Supervision.   Patient's sisters present for family education and demonstrate understanding on how to provide supervisory care.   Reasons goals not met: N/A   Recommendation:  Patient will benefit from ongoing skilled PT services in home health setting to continue to advance safe functional mobility, address ongoing impairments in balance, coordination, activity tolerance, gait, and minimize fall risk.  Equipment: Conservation officer, nature   Reasons for discharge: treatment goals met and discharge from hospital  Patient/family agrees with progress made and goals achieved: Yes  Treatment Session 1:  Pt agreeable to PT session and declines pain. PT assessed pain interference, sensation, strength, and coordination in preparation for discharge. Pt propelled w/c mod I ~150 ft and transported remaining distance to main gym. Pt navigated 4 steps with 2 HR's and CGA for balance. Pt transported by w/c to room for time management and energy conservation and left seated in w/c at bedside with chair alarm on and all needs within reach.   Treatment Session 2:  PT present at end of OT session and pt family present for education. Family educated on how transfers, gait, and how to provide supervisory assistance to pt upon discharge. Pt mod I with sit to stand with no UE support and supervision for gait x 260 ft with RW  with 1 brief standing rest break. Pt ambulated to room and left seated in w/c at bedside with chair alarm on and all needs within reach.    Treatment Session 3:  Pt agreeable to PT session and without verbal complaints of pain in session. Pt transported by w/c to dayroom for energy conservation. Pt performed NuStep with UE/LE at level 5 x 10 minutes. Pt independent with dynamic sitting balance while engaging in dribbling and shooting basketball for goal. Pt returned to room by w/c and left with all needs in reach and alarm on.    PT Discharge  Pain Interference Pain Interference Pain Effect on Sleep: 0. Does not apply - I have not had any pain or hurting in the past 5 days Pain Interference with Therapy Activities: 1. Rarely or not at all Pain Interference with Day-to-Day Activities: 1. Rarely or not at all Cognition Overall Cognitive Status: Within Functional Limits for tasks assessed Arousal/Alertness: Awake/alert Orientation Level: Oriented X4 Memory: Appears intact Awareness: Appears intact Problem Solving: Appears intact Safety/Judgment: Appears intact Sensation Sensation Light Touch: Appears Intact Proprioception: Appears Intact Coordination Gross Motor Movements are Fluid and Coordinated: Yes Fine Motor Movements are Fluid and Coordinated: No Coordination and Movement Description: improved gross coordination and balance with mobility Finger Nose Finger Test: mild dysmetria worse on left compared to right Heel Shin Test: intact bilaterally Motor  Motor Motor: Tetraplegia Motor - Skilled Clinical Observations: grossly coordinated due to improvements in strength, balance, coordination  Mobility Bed Mobility Bed Mobility: Rolling Right;Rolling Left;Sit to Supine;Supine to Sit Rolling Right: Independent Rolling Left: Independent Left Sidelying to Sit: Independent Supine to Sit: Independent Sit to Supine:  Independent Transfers Transfers: Sit to Stand;Stand to Sit;Stand  Pivot Transfers Sit to Stand: Independent with assistive device Stand to Sit: Independent with assistive device Stand Pivot Transfers: Independent with assistive device Transfer (Assistive device): Rolling walker Locomotion  Gait Ambulation: Yes Gait Assistance: Supervision/Verbal cueing Gait Distance (Feet): 200 Feet (ft) Assistive device: Rolling walker Gait Gait: Yes Gait Pattern: Shuffle;Trunk flexed Gait velocity: decreased Stairs / Additional Locomotion Stairs: Yes Stairs Assistance: Contact Guard/Touching assist Stair Management Technique: One rail Right Number of Stairs: 8 Height of Stairs: 4 (inches) Ramp: Supervision/Verbal cueing Curb: Nurse, mental health Mobility: No  Trunk/Postural Assessment  Cervical Assessment Cervical Assessment: Exceptions to [facility] (forward head) Thoracic Assessment Thoracic Assessment: Exceptions to [facility] (flexed posture) Lumbar Assessment Lumbar Assessment: Exceptions to [facility] (posterior pelvic tilt) Postural Control Postural Control: Deficits on evaluation Righting Reactions: delayed Protective Responses: delayed  Balance Balance Balance Assessed: Yes Static Sitting Balance Static Sitting - Balance Support: Right upper extremity supported;Left upper extremity supported;Feet supported Static Sitting - Level of Assistance: 7: Independent Dynamic Sitting Balance Dynamic Sitting - Balance Support: Feet supported Dynamic Sitting - Level of Assistance: 7: Independent Static Standing Balance Static Standing - Balance Support: During functional activity;Bilateral upper extremity supported Static Standing - Level of Assistance: 6: Modified independent (Device/Increase time) Dynamic Standing Balance Dynamic Standing - Balance Support: During functional activity;Bilateral upper extremity supported Dynamic Standing - Level of Assistance: 6: Modified independent (Device/Increase time) Dynamic Standing -  Balance Activities: Lateral lean/weight shifting;Reaching for objects;Forward lean/weight shifting Extremity Assessment  RLE Assessment RLE Assessment: Within Functional Limits General Strength Comments: grossly 4+/5 LLE Assessment LLE Assessment: Within Functional Limits General Strength Comments: grossly 4+/5 except hip flexion 4/5   [address] Verl Dicker PT, DPT  10/27/2022, 10:01 AM

## 2022-10-27 NOTE — Progress Notes (Addendum)
Patient family members at bedside. No nursing questions prior to DC tomorrow. Patient states she will be living with her daughter, states her daughter may call with questions.  Educated patient and family on repositioning while in bed and chair, encourage fluids, continued prescribed medications and to follow up with scheduled appointments. Patient has printed education on heart healthy low carbohydrate diet.

## 2022-10-28 ENCOUNTER — Encounter: Payer: Self-pay | Admitting: Oncology

## 2022-10-28 ENCOUNTER — Other Ambulatory Visit (HOSPITAL_COMMUNITY): Payer: Self-pay

## 2022-10-28 LAB — GLUCOSE, CAPILLARY
Glucose-Capillary: 90 mg/dL (ref 70–99)
Glucose-Capillary: 212 mg/dL — ABNORMAL HIGH (ref 70–99)

## 2022-10-28 LAB — BASIC METABOLIC PANEL
Anion gap: 6 (ref 5–15)
BUN: 13 mg/dL (ref 8–23)
CO2: 19 mmol/L — ABNORMAL LOW (ref 22–32)
Calcium: 8 mg/dL — ABNORMAL LOW (ref 8.9–10.3)
Chloride: 111 mmol/L (ref 98–111)
Creatinine, Ser: 1.55 mg/dL — ABNORMAL HIGH (ref 0.44–1.00)
GFR, Estimated: 37 mL/min — ABNORMAL LOW (ref >60)
Glucose, Bld: 73 mg/dL (ref 70–99)
Potassium: 4.3 mmol/L (ref 3.5–5.1)
Sodium: 136 mmol/L (ref 135–145)

## 2022-10-28 LAB — URINE CULTURE
Culture: 30000 — AB
Special Requests: NORMAL

## 2022-10-28 LAB — CBC WITH DIFFERENTIAL/PLATELET
Abs Immature Granulocytes: 0.02 10*3/uL (ref 0.00–0.07)
Basophils Absolute: 0.1 10*3/uL (ref 0.0–0.1)
Basophils Relative: 1 %
Eosinophils Absolute: 0.2 10*3/uL (ref 0.0–0.5)
Eosinophils Relative: 2 %
HCT: 27.5 % — ABNORMAL LOW (ref 36.0–46.0)
Hemoglobin: 9.1 g/dL — ABNORMAL LOW (ref 12.0–15.0)
Immature Granulocytes: 0 %
Lymphocytes Relative: 26 %
Lymphs Abs: 2.6 10*3/uL (ref 0.7–4.0)
MCH: 31.1 pg (ref 26.0–34.0)
MCHC: 33.1 g/dL (ref 30.0–36.0)
MCV: 93.9 fL (ref 80.0–100.0)
Monocytes Absolute: 1.2 10*3/uL — ABNORMAL HIGH (ref 0.1–1.0)
Monocytes Relative: 12 %
Neutro Abs: 5.7 10*3/uL (ref 1.7–7.7)
Neutrophils Relative %: 59 %
Platelets: 80 10*3/uL — ABNORMAL LOW (ref 150–400)
RBC: 2.93 MIL/uL — ABNORMAL LOW (ref 3.87–5.11)
RDW: 22.6 % — ABNORMAL HIGH (ref 11.5–15.5)
Smear Review: DECREASED
WBC: 9.8 10*3/uL (ref 4.0–10.5)
nRBC: 0 % (ref 0.0–0.2)

## 2022-10-28 MED ORDER — INSULIN PEN NEEDLE 32G X 4 MM MISC
1.0000 | Freq: Every day | 1 refills | Status: AC
  Filled 2022-10-28: qty 100, 30d supply, fill #0

## 2022-10-28 MED ORDER — TRESIBA FLEXTOUCH 100 UNIT/ML ~~LOC~~ SOPN: 11.0000 [IU] | PEN_INJECTOR | Freq: Every day | SUBCUTANEOUS | 1 refills | Status: DC

## 2022-10-28 MED ORDER — AMOXICILLIN 500 MG PO CAPS
500.0000 mg | ORAL_CAPSULE | Freq: Two times a day (BID) | ORAL | 0 refills | Status: AC
  Filled 2022-10-28: qty 14, 7d supply, fill #0

## 2022-10-28 MED ORDER — AMOXICILLIN 250 MG PO CAPS
500.0000 mg | ORAL_CAPSULE | Freq: Once | ORAL | Status: AC
  Administered 2022-10-28: 500 mg via ORAL
  Filled 2022-10-28: qty 2

## 2022-10-28 NOTE — Progress Notes (Addendum)
Inpatient Rehabilitation Discharge Medication Review by a Pharmacist  A complete drug regimen review was completed for this patient to identify any potential clinically significant medication issues.  High Risk Drug Classes Is patient taking? Indication by Medication  Antipsychotic No   Anticoagulant No   Antibiotic Yes amoxicillin: UTI  Opioid Yes Oxycodone: pain  Antiplatelet No   Hypoglycemics/insulin Yes Tresiba: diabetes  Vasoactive Medication Yes Amlodipine: hypertension  Chemotherapy No   Other Yes Lipitor: hyperlipidemia Lexapro: mood Zofran: nausea/vomitting Senokot: constipation Gabapentin, Robaxin, voltaren gel: pain Calcium, zinc: vitamins/supplements Albuterol, Trelegy: COPD Flonase: allergies Emla cream: Port access Hydrocortisone cream: skin care     Type of Medication Issue Identified Description of Issue Recommendation(s)  Drug Interaction(s) (clinically significant)     Duplicate Therapy     Allergy     No Medication Administration End Date     Incorrect Dose     Additional Drug Therapy Needed     Significant med changes from prior encounter (inform family/care partners about these prior to discharge).    Other       Clinically significant medication issues were identified that warrant physician communication and completion of prescribed/recommended actions by midnight of the next day:  No   Time spent performing this drug regimen review (minutes): 20   Thank you for allowing [place] to participate in this patients care. Jens Som, PharmD 10/28/2022 9:05 AM  **Pharmacist phone directory can be found on [email or web address] listed under [place]**

## 2022-10-28 NOTE — Progress Notes (Signed)
Recreational Therapy Discharge Summary Patient Details  Name: Janice Short MRN: [id number] Date of Birth: 1961/04/17 Today's Date: 10/28/2022  Comments on progress toward goals: Pt has made good progress during LOS.  TR sessions/education focused on leisure education, activity analysis/modifications, stress management/coping, activity tolerance, UE use/coordination.  Pt is discharging home today with family to provide/coordinate supervision.  Pt is anxious to return to previously enjoyed activities and being home with her daughter and grandson.  Reasons for discharge: discharge from hospital  Follow-up: [place] agrees with progress made and goals achieved: Yes  Nehemias Sauceda 10/28/2022, 9:40 AM

## 2022-10-28 NOTE — Progress Notes (Signed)
Patient ID: Janice Short, female   DOB: [birth year], 61 y.o.   MRN: [id number]  SW called pt insurance to verify that she has transportation benefits; 48 one way trips.  SW spoke with [employer] [phone] ref ID #[id number]) through her insurance to set up pick for 1pm with Melburn Popper.   SW met with pt in room to inform on above, and gave her contact information for above for transportation.   Loralee Pacas, MSW, [place] Office: [phone] Cell: [phone] Fax: [phone]

## 2022-10-28 NOTE — Progress Notes (Signed)
PROGRESS NOTE   Subjective/Complaints:  N/V resolved Needs anti-nausea to go home with.   Ready for d/c.  Needs IV removed- will call PCP to get in with her ASAP   ROS:    Pt denies SOB, abd pain, CP, N/V/C/D, and vision changes   Objective:   No results found. Recent Labs    10/27/22 0558 10/28/22 0602  WBC 8.4 9.8  HGB 8.2* 9.1*  HCT 25.1* 27.5*  PLT 57* 80*    Recent Labs    10/27/22 0909 10/28/22 0602  NA 135 136  K 4.6 4.3  CL 111 111  CO2 18* 19*  GLUCOSE 272* 73  BUN 15 13  CREATININE 1.61* 1.55*  CALCIUM 7.7* 8.0*     Intake/Output Summary (Last 24 hours) at 10/28/2022 0851 Last data filed at 10/28/2022 0755 Gross per 24 hour  Intake 600 ml  Output --  Net 600 ml         Physical Exam: Vital Signs Blood pressure (!) 108/51, pulse 74, temperature (!) 97.4 F (36.3 C), temperature source Oral, resp. rate 18, height 5\' 7"  (1.702 m), weight 84.3 kg, SpO2 98 %.           General: awake, alert, appropriate, sitting up in w/c at sink- wearing collar;  NAD HENT: conjugate gaze; oropharynx moist CV: regular rate; no JVD Pulmonary: CTA B/L; no W/R/R- good air movement GI: soft, NT, ND, (+)BS Psychiatric: appropriate Neurological: Ox3 Skin:- Has an area where had some previous acne on L rhomboids- a little excoriation- dry skin- a bruise on R AC- incision looks great Musculoskeletal L trap much less tight- K tape in place on L shoulder, however tape coming off in 1 place    Comments: RUE- Bicep 4+/5; triceps 4/5; WE 4-/5; grip 4-/5; FA 4-/5 LUE- Biceps 4-/5; WE 3-/5; Triceps 2/5; Grip 2-/5 and FA 1/5- no real change today on exam RLE- HF, KE 4/5; DF/PF and EHL 4+/5 LLE- DF/PF 4/5 and EHL 4-/5- pt unable to let me uncover her again, she's so cold to test rest of LLE  Skin:    General: Skin is warm and dry.     Comments: Skin laceration to left facial cheek with stitches in place.   ACDF site clean and dry Stage III- superficial stage III 1.3 x 1.0 x 0.5 cm on coccyx with a few tiny spots that are more superficial around it   Neurological:     Mental Status: She is alert.     Comments: Patient is alert and oriented x3.  Follows full commands. Ox3 No abnl tone or clonus RLE/LLE Assessment/Plan: 1. Functional deficits which require 3+ hours per day of interdisciplinary therapy in a comprehensive inpatient rehab setting. Physiatrist is providing close team supervision and 24 hour management of active medical problems listed below. Physiatrist and rehab team continue to assess barriers to discharge/monitor patient progress toward functional and medical goals  Care Tool:  Bathing    Body parts bathed by patient: Chest, Front perineal area, Abdomen, Face, Left arm, Right upper leg, Left upper leg, Right arm, Right lower leg, Left lower leg, Buttocks   Body parts bathed by helper: Right  arm, Right lower leg, Left lower leg, Buttocks     Bathing assist Assist Level: Independent with assistive device     Upper Body Dressing/Undressing Upper body dressing   What is the patient wearing?: Pull over shirt, Orthosis    Upper body assist Assist Level: Independent    Lower Body Dressing/Undressing Lower body dressing      What is the patient wearing?: Incontinence brief, Pants     Lower body assist Assist for lower body dressing: Independent with assitive device     Toileting Toileting    Toileting assist Assist for toileting: Independent with assistive device     Transfers Chair/bed transfer  Transfers assist     Chair/bed transfer assist level: Independent with assistive device     Locomotion Ambulation   Ambulation assist   Ambulation activity did not occur: Safety/medical concerns  Assist level: Independent with assistive device Assistive device: Walker-rolling     Walk 10 feet activity   Assist  Walk 10 feet activity did not occur:  Safety/medical concerns  Assist level: Supervision/Verbal cueing Assistive device: Walker-rolling   Walk 50 feet activity   Assist Walk 50 feet with 2 turns activity did not occur: Safety/medical concerns  Assist level: Supervision/Verbal cueing Assistive device: Walker-rolling    Walk 150 feet activity   Assist Walk 150 feet activity did not occur: Safety/medical concerns  Assist level: Supervision/Verbal cueing Assistive device: Walker-rolling    Walk 10 feet on uneven surface  activity   Assist Walk 10 feet on uneven surfaces activity did not occur: Safety/medical concerns   Assist level: Supervision/Verbal cueing Assistive device: Walker-rolling   Wheelchair     Assist Is the patient using a wheelchair?: No Type of Wheelchair: Manual    Wheelchair assist level: Dependent - Patient 0%      Wheelchair 50 feet with 2 turns activity    Assist        Assist Level: Dependent - Patient 0%   Wheelchair 150 feet activity     Assist      Assist Level: Dependent - Patient 0%   Blood pressure (!) 108/51, pulse 74, temperature (!) 97.4 F (36.3 C), temperature source Oral, resp. rate 18, height 5\' 7"  (1.702 m), weight 84.3 kg, SpO2 98 %.  Medical Problem List and Plan: 1. 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  Status post ACDF C4-5 10/02/2022 per Dr.Yarbrough.  Cervical collar when out of bed applied in sitting position             -patient may  shower- monitor skin             -ELOS/Goals: 3-4 weeks- min A hopefully  D/c 11/22  Pt insists on leaving tomorrow- I think it's reasonable, since treating UTI  Con't CIR- PT and OT- team conference today to finalize d/c. Educated to drink 6-8 cups water/day  D/c today -DVT/anticoagulation:  Pharmaceutical: Lovenox.   Venous Doppler  studies 10/07/2022 negative  11/10- saw getting lovenox BID - will make 40 mg daily.              -antiplatelet therapy: N/A 3. Pain Management: Neurontin 300 mg twice daily, Robaxin and oxycodone as needed  11/3- pain 8-9/10 this Am, but was due for pain meds- con't regimen for now- will increase to 600 mg BID but no higher due to some elevation/Cr of  1.4-1.5  11/6- pain controlled- con't regimen  11/7- will add voltaren gel 2G QID for hands/feet  11/9- pain controlled with meds- con't regimen  11/14- pain in L shoulder- is myofascial- will schedule robaxin 1000 mg QID  11/15- with taping, Kpad, and Robxin, shoulder pain resolved  11/16- tape off, but pain controlled  -11/18 reports hand pain controlled with voltaren gel  11/20- pain controlled  11/22- get 7 days from [place] then call clinic for pain meds refill 4. Mood/Behavior/Sleep: Lexapro 25 mg daily             -antipsychotic agents: N/A 5. Neuropsych/cognition: This patient is capable of making decisions on her own behalf. 6. Skin/Wound Care left facial cheek laceration: Status post sutures received in ED.  Routine skin checks 7. Fluids/Electrolytes/Nutrition:   -will adv to regular diet. She can make choices as to what she can chew or not 8.  Diabetes mellitus.  Hemoglobin A1c 5.9.  Currently maintained on Semglee 8 units daily.  Patient on Tresiba 8 units daily prior to admission  11/6- CBG's labile- 96-222-  11/10- CBG's 78- 193= with 1 value of 215- but otherwise pretty well controlled- will monitor 11/15- CBG down to 75 this AM, but up to 199 yesterday late AM- will add Metformin 500 mg q breakfast- due to Cr, don't want too high of dose- will check with pt, was she on insulin at home?  11/16- pt was on insulin pen 20 units at home- on 8 units here and Metformin- will increase insulin to 11 units- and will monitor  11/17- Sugars spiked yesterday, much higher than normal- snacks? If doesn't improve, suggest increasing Semglee over  weekend  -11/18 CBGs good in AM but higher at mealtime, start 2 u novolog with meals  11/21- Cbgs look good- con't regimen  11.22- due to renal issues, won't send home on insulin with meals or Metformin- needs f/u with PCP 9.  AKI on CKD stage III.  Baseline creatinine 1.18-1.26.  Follow-up chemistries  11/3-4- Cr up to 1.41- will recheck Monday and push fluids  11/8- Cr 1.35- but has been hanging around 1.2 to 1.4-   11/14- Cr slightly more at 1.44- will push fluids- because BUN 11  11/20- Cr up to 1.72- might be why she feels bad- will give IVFs, since dehydrated  11/21- Has UTI- but on IVFs- ordered BMP but not done so far this AM- is active per chart- will recheck in AM  11.22- Cr 1.55- pushed pt to keep drinking 6-8 cups/day 10.  Acute on chronic anemia.  Follow-up CBC 11.  Intrahepatic cholangiocarcinoma.  Patient on chemotherapy followed by Dr. Randa Evens.  Follow-up outpatient- port not accessed- missed last chemo treatment-her car broke down- will need to be here 3-4 weeks.   11/3- supposedly got chemo q2 weeks- will need to check with Oncology what to do while here. D/w PA  11/6- they will restart once leaves rehab  11/22- touched base with her Oncologist and they will f/u 12.  Obesity.  BMI 27.62.  Dietary follow-up 13.  Hypertension.  Norvasc 2.5 mg daily.  Monitor with increased mobility  -well controlled, continue to monitor 14.  History of tobacco use/COPD.  Continue inhalers as indicated 15.  Hyperlipidemia.  Lipitor 16. Complicated UTI.Empiric IV Rocephin 10/08/2022 changed to keflex for 7 days- pending culture, hopefully- will monitor  11/6- ON Keflex for Proteus- is sensitive-   11/22- back on Keflex for E fecalis 30k- for 5 days-  17.  Neurogenic bladder- will do bladder scans q6 hours and cath if volumes >350cc  11/14- hasn't required caths.  18. Neurogenic bowel with constipation- will get cleaned out- Sorbitol 45cc now and follow with SSE if no results- might need  bowel program- not clear yet.   11/14- said going- but a little soft- will decrease Senna to 1 tab and con't miralax.   11/17- LBM 2 days ago- medium- if no BM by tomorrow, will need intervention  11/18 LBM today- improved continue to monitor  11/20- LBM yesterday  11/22- LBM yesterday 19. Stage III coccyx pressure ulcer- on admission- will automatically get WOC consult and determine if needs air mattress, place foam for the moment and turn often- will order turning q2 hours  11/3- will make sure she's on ROHO- seen by WOC- started aquacel with foam dressing to change daily.   11/22- is healing- educated on doing pressure relief when in w/c.  20. Shivering/cold- will order kpad to warm her up and to help back pain.   11/3- resolved 21. Leukocytosis-  11/6- no fever- WBC 19.6- up from 14k-    11/8- WBC back down to her "baseline" of 14.2- 11/14- WBC down to 11.7- doing better-   22. Hemorrhoids  11/7- adding Anusol supp after dinner at 6pm daily.  23. Hypocalcemia  11/8- will monitor  11/14- Ca 7.6- will add Ca carbonate  11/21- Ca 7.7- will increase Ca carbonate 24. Itching  11/17- will add steroid cream for back BID 25. Thrombocytopenia  11/20- plts down to 72k- down from 124k, down from 200k- will stop Lovenox- esp since plts not doing great AND is walking >200 ft usually.   11/21- Plts down to 57k- off lovenox will consult Heme/Onc.   11/22- spoke to heme/onc- plts up to 80k- but they will f/u with her on this.  26. N/V- UTI  11/20- will give IVFs and anti nausea medicine- will check U/A to make sure- no dysuria however.   11/21- has UTI- moderate leukos and 21-50 WBCs- last U/Cx showed Proteus was sensitive to Keflex- will start 500 mg q12 hours for 5 days- per d/w pharmacy  11/22- Sx's of N/V resolved  I spent a total of 31   minutes on total care today- >50% coordination of care- due to d/w Oncology and pt about f/u and plans   LOS: 20 days A FACE TO FACE EVALUATION WAS  PERFORMED  Kem Parcher 10/28/2022, 8:51 AM

## 2022-11-02 ENCOUNTER — Ambulatory Visit: Payer: Self-pay | Admitting: *Deleted

## 2022-11-02 ENCOUNTER — Telehealth: Payer: Self-pay

## 2022-11-02 NOTE — Telephone Encounter (Signed)
  Chief Complaint: constipation from pain medications Symptoms: Having to push hard to have a BM.   Finally had a BM this morning Frequency: Since being discharged from the hospital.   Was given a lot of medications in the hospital that are constipating. Pertinent Negatives: Patient denies bloody stools or abd pain.   A little blood on tissue when she wipes from pushing so hard.  Disposition: [] ED /[] Urgent Care (no appt availability in office) / [] Appointment(In office/virtual)/ []  [facility]/ [x] Home Care/ [] Refused Recommended Disposition /[] [facility]/ []  Follow-up with PCP Additional Notes: Went over the home care advice since she has not tried any OTC remedies.   Instructed her to call us back if the OTC medications for constipation did not help.   Pt. Was agreeable to this plan.

## 2022-11-02 NOTE — Telephone Encounter (Signed)
Message from Erick Blinks sent at 11/02/2022 11:40 AM EST  Summary: Constipated (Seeking Rx)   Pt is extremely constipated and is seeking medication, she has been given a lot of meds from PT that have constipated her.  [facility] [address] [place] - [place] [address] Phone: [phone] Fax: [phone]  [phone]          Call History   Type Contact Phone/Fax User  11/02/2022 11:39 AM EST Phone (Incoming) Shavaun, [place] (Self) [phone] [place], [place]   Reason for Disposition  Rectal pain  Answer Assessment - Initial Assessment Questions 1. STOOL PATTERN OR FREQUENCY: "How often do you have a bowel movement (BM)?"  (Normal range: 3 times a day to every 3 days)  "When was your last BM?"       I'm having a hard time having a BM.   I'm very constipated.  I have to push hard to have one.   I'm in pain in my butt area.    I did have a BM this morning but I had to push real hard.   2. STRAINING: "Do you have to strain to have a BM?"      Yes very hard 3. RECTAL PAIN: "Does your rectum hurt when the stool comes out?" If Yes, ask: "Do you have hemorrhoids? How bad is the pain?"  (Scale 1-10; or mild, moderate, severe)     I was bleeding and still am a little bit.   I'm hurting around my butt.   Has not tried hemorrhoid cream.   I just got out of the hospital.   I fell and messed up my neck.  I'm on pain medications.    4. STOOL COMPOSITION: "Are the stools hard?"      Yes  5. BLOOD ON STOOLS: "Has there been any blood on the toilet tissue or on the surface of the BM?" If Yes, ask: "When was the last time?"     No Just a little when I wipe from straining. 6. CHRONIC CONSTIPATION: "Is this a new problem for you?"  If No, ask: "How long have you had this problem?" (days, weeks, months)      Yes   I'm on pain medication for my neck.  I'm not normally constipated.  7. CHANGES IN DIET OR HYDRATION: "Have there been any recent changes  in your diet?" "How much fluids are you drinking on a daily basis?"  "How much have you had to drink today?"     I was in the hospital recently for my neck.   I fell and messed it up.   8. MEDICINES: "Have you been taking any new medicines?" "Are you taking any narcotic pain medicines?" (e.g., Dilaudid, morphine, Percocet, Vicodin)     Yes pain medication 9. LAXATIVES: "Have you been using any stool softeners, laxatives, or enemas?"  If Yes, ask "What, how often, and when was the last time?"     No.   I asked if she had tried any OTC remedies and she had not.   I suggested Preparation H for the sore hemorrhoids and Dulcolax or Miralax for the constipation while on the pain medication. 10. ACTIVITY:  "How much walking do you do every day?"  "Has your activity level decreased in the past week?"        Not as active because just recently discharged from the hospital. 11. CAUSE: "What do you think is causing the  constipation?"        She didn't know until I let her know the side effects of the pain medications. 12. OTHER SYMPTOMS: "Do you have any other symptoms?" (e.g., abdomen pain, bloating, fever, vomiting)       Denies abd pain.   "It just hurts around my butt back there".   Had a little bleeding when she wiped. 13. MEDICAL HISTORY: "Do you have a history of hemorrhoids, rectal fissures, or rectal surgery or rectal abscess?"         Yes 14. PREGNANCY: "Is there any chance you are pregnant?" "When was your last menstrual period?"       N/A due to age  Protocols used: Constipation-A-AH

## 2022-11-02 NOTE — Telephone Encounter (Signed)
Transition Care Management Unsuccessful Follow-up Telephone Call  Date of discharge and from where:  [facility] 10/28/2022  Attempts:  1st Attempt  Reason for unsuccessful TCM follow-up call:  Left voice message on Lindsey's number Juanda Crumble, [place] Direct Dial [phone]

## 2022-11-02 NOTE — Telephone Encounter (Signed)
Transition Care Management Follow-up Telephone Call Date of discharge and from where: [facility] 10/28/2022  How have you been since you were released from the hospital? Weak, unwilling walk Any questions or concerns? No  Items Reviewed: Did the pt receive and understand the discharge instructions provided? Yes  Medications obtained and verified? Yes  Other? No  Any new allergies since your discharge? No  Dietary orders reviewed? Yes Do you have support at home? Yes   Home Care and Equipment/Supplies: Were home health services ordered? yes If so, what is the name of the agency? unknown  Has the agency set up a time to come to the patient's home? no Were any new equipment or medical supplies ordered?  No What is the name of the medical supply agency? N/a Were you able to get the supplies/equipment? no Do you have any questions related to the use of the equipment or supplies? No  Functional Questionnaire: (I = Independent and D = Dependent) ADLs: D  Bathing/Dressing- D  Meal Prep- D  Eating- I  Maintaining continence- D  Transferring/Ambulation- D  Managing Meds- D  Follow up appointments reviewed:  PCP Hospital f/u appt confirmed? Yes  Scheduled to see Dr Ancil Boozer on 11/16/2022 @ 1:40. [facility] f/u appt confirmed? No  Are transportation arrangements needed? No  If their condition worsens, is the pt aware to call PCP or go to the Emergency Dept.? Yes Was the patient provided with contact information for the PCP's office or ED? Yes Was to pt encouraged to call back with questions or concerns? Yes  Juanda Crumble, LPN [place] Direct Dial [phone]

## 2022-11-03 ENCOUNTER — Telehealth: Payer: Self-pay | Admitting: Family Medicine

## 2022-11-03 NOTE — Telephone Encounter (Signed)
Verbal orders given  

## 2022-11-03 NOTE — Telephone Encounter (Signed)
Home Health Verbal Orders - Caller/Agency: Gerald Stabs from [facility] Number: [phone] Requesting PT Frequency: 2x4 1x5

## 2022-11-04 ENCOUNTER — Ambulatory Visit: Payer: Self-pay | Admitting: *Deleted

## 2022-11-04 NOTE — Telephone Encounter (Signed)
Summary: Potential UTI, needs abx   Pt's daughter shared that the PT nurse states that the patient is at high risk for UTI, due to her slow progress and condition. She is unable to control her bowel movements. The PT suggests that she gets a refill of amoxicllin.  [facility] [address], [place] - [place] [address] Phone: [phone] Fax: [phone]       Attempted to call daughter, Lindsey(DPR) number- no answer- call can not be completed as dialed message.

## 2022-11-04 NOTE — Telephone Encounter (Signed)
3rd attempt to contact patient. Attempted to call [phone] and [phone] and no answer, unable to leave message , records call can not be completed at this time. Please advise regarding request for antibiotic refill.  Summary: Potential UTI, needs abx   Pt's daughter shared that the PT nurse states that the patient is at high risk for UTI, due to her slow progress and condition. She is unable to control her bowel movements. The PT suggests that she gets a refill of amoxicllin.  [facility] [address] [place] - [place] [address] Phone: [phone] Fax: [phone]

## 2022-11-04 NOTE — Progress Notes (Unsigned)
Name: Janice Short   MRN: [id number]    DOB: Nov 21, 1961   Date:11/05/2022       Progress Note  [facility] Follow-Up  HPI  Admitted: 10/08/22 Discharged: 10/28/22  [name] has multiple medical problems, she went to [facility] after falling backwards on 10/01/2022 , she was found to have nondisplaced teardrop type fracture involving the anterior inferior aspect of of C2 and severe stenosis C4-5 wih myelomalacia. Neurosurgeon consulted and she has anterior cervical discectomy done on 10/02/2022 by Dr. Cari Caraway on Nov 2 nd she was transferred to [facility] and sent home on 10/28/2022   Hospital stay was complicated by fever and was treated with a dose of Rocephin and 5 days of keflex  Uncontrolled diabetes that was covered with sliding scale insulin  Acute on chronic kidney disease and hyperkalemia - she was given fluids and also veltassa  Bronchitis required nebulizer therapy  Thrombocytopenia and anemia. Hemoglobin improved after discharge up from 8.2 to 9.1 Platelets dropped to 57 but up to 80 on 11/22  Urine culture was positive but patient was treated during her stay   Medication reconciliation was done by reading medication list, she did not bring her medications with her  She had a transition of care call on 10/09/2022 and again on 11/02/2022 ( she was discharge the day prior to Thanksgiving holiday)   Since she has been home she has not been checking her glucose   She has noticed a sore spot on her bottom.   She is having pain, she states all over. She no longer has pain medications at home. Advised to follow up with Dr. 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[place]) 10/01/2022   Hyperkalemia 10/01/2022   Stage 3a chronic kidney disease ([place]) 10/01/2022   Left hip pain    Laceration of cheek without complication, left, initial encounter    Moderate protein-calorie malnutrition ([place]) 09/25/2022   Simple chronic bronchitis ([place]) 09/25/2022   Tobacco use 09/25/2022   Rectal polyp    Polyp of sigmoid colon    Polyp of duodenum    Intrahepatic cholangiocarcinoma ([place]) 04/12/2021   Goals of care, counseling/discussion 04/12/2021   Mild episode of recurrent major depressive disorder ([place]) 02/08/2019   Uncontrolled type 2 diabetes mellitus with hyperglycemia, with long-term current use of insulin ([place]) 12/17/2017   Spinal stenosis in cervical region 10/15/2016   Diabetic polyneuropathy associated with type 2 diabetes mellitus ([place]) 07/14/2016   Chronic midline low back pain with sciatica 06/22/2016   Primary osteoarthritis of both knees 06/22/2016   Carpal tunnel syndrome 07/01/2015   Cervical radiculitis 07/01/2015   Corn or callus 07/01/2015   Impingement syndrome of shoulder 07/01/2015   Microalbuminuria 07/01/2015   Tenosynovitis of wrist 07/01/2015   Hypertension 11/05/2009   Athlete's foot 08/05/2009   Vitamin D deficiency 01/08/2009   Allergic rhinitis 12/28/2008   Lumbosacral neuritis 08/11/2007    Past Surgical History:  Procedure Laterality Date   ABDOMINAL HYSTERECTOMY     ANTERIOR CERVICAL DECOMP/DISCECTOMY FUSION N/A 10/02/2022   Procedure: ANTERIOR CERVICAL DECOMPRESSION/DISCECTOMY FUSION 1 LEVEL;  Surgeon: Meade Maw, MD;  Location: [facility] ORS;  Service: Neurosurgery;  Laterality: N/A;  ACDF C4-5.   COLONOSCOPY WITH PROPOFOL N/A 05/06/2021   Procedure: COLONOSCOPY WITH PROPOFOL;  Surgeon: Lucilla Lame, MD;  Location: [facility];  Service: Endoscopy;  Laterality: N/A;   ESOPHAGOGASTRODUODENOSCOPY (EGD) WITH PROPOFOL N/A 05/06/2021   Procedure: ESOPHAGOGASTRODUODENOSCOPY (EGD) WITH PROPOFOL;  Surgeon: Lucilla Lame, MD;  Location: [facility] ENDOSCOPY;  Service: Endoscopy;  Laterality: N/A;   PORTA CATH INSERTION N/A 07/17/2021   Procedure: PORTA CATH INSERTION;  Surgeon: Algernon Huxley, MD;  Location: [place] [facility];  Service: Cardiovascular;  Laterality: N/A;    Family History  Problem Relation Age of Onset   Heart attack Mother    CAD Mother    Kidney disease Father     Social History   Tobacco Use   Smoking status: Some Days    Types: Cigarettes   Smokeless tobacco: Never   Tobacco comments:    1 per day 01/30/21 does not inhale it  Substance Use Topics   Alcohol use: Not Currently     Current Outpatient Medications:    albuterol (VENTOLIN HFA) 108 (90 Base) MCG/ACT inhaler, INHALE TWO PUFFS BY MOUTH EVERY 6 HOURS AS NEEDED FOR WHEEZING OR FOR SHORTNESS OF BREATH (BULK), Disp: 8.5 g, Rfl: 0   amLODipine (NORVASC) 2.5 MG tablet, Take 1 tablet (2.5 mg total) by mouth daily., Disp: 30 tablet, Rfl: 0   amoxicillin (AMOXIL) 500 MG capsule, Take 1 capsule (500 mg total) by mouth 2 (two) times daily., Disp: 14 capsule, Rfl: 0   atorvastatin (LIPITOR) 40 MG tablet, Take 1 tablet (40 mg total) by mouth daily., Disp: 90 tablet, Rfl: 1   calcium carbonate (OSCAL) 1500 (600 Ca) MG TABS tablet, Take 1 tablet (1,500 mg total) by mouth 3 (three) times daily with meals., Disp: 90 tablet, Rfl: 0   diclofenac Sodium (VOLTAREN) 1 % GEL, Apply 2 g topically 4 (four) times daily., Disp: 350 g, Rfl: 0   escitalopram (LEXAPRO) 20 MG tablet, TAKE ONE TABLET BY MOUTH DAILY AT 9AM, Disp: 90 tablet, Rfl: 1   fluticasone (FLONASE) 50 MCG/ACT nasal spray, Place 2 sprays into both nostrils daily as needed. USE 2 SPRAY(S) IN EACH NOSTRIL AT BEDTIME, Disp: 16 g, Rfl: 0   Fluticasone-Umeclidin-Vilant  (TRELEGY ELLIPTA) 100-62.5-25 MCG/ACT AEPB, Inhale 1 puff into the lungs daily. (Patient taking differently: Inhale 1 puff into the lungs daily.), Disp: 3 each, Rfl: 1   gabapentin (NEURONTIN) 300 MG capsule, TAKE TWO CAPSULES BY MOUTH TWICE DAILY @ 9AM & 5PM, Disp: 360 capsule, Rfl: 1   hydrocortisone cream 1 %, Apply topically 2 (two) times daily., Disp: 30 g, Rfl: 0   insulin degludec (TRESIBA FLEXTOUCH) 100 UNIT/ML FlexTouch Pen, Inject 11 Units into the skin at bedtime., Disp: 15 mL, Rfl: 1   Insulin Pen Needle 32G X 4 MM MISC, Use at bedtime with insulin, Disp: 100 each, Rfl: 1   lidocaine-prilocaine (EMLA) cream, Apply 1 Application topically as needed. Apply to port and cover with saran wrap 1-2 hours prior to port access, Disp: 30 g, Rfl: 3   methocarbamol (ROBAXIN) 500 MG tablet, Take 1 tablet (500 mg total) by mouth 4 (four) times daily., Disp: 120 tablet, Rfl: 0   ondansetron (ZOFRAN) 4 MG tablet, Take 1 tablet (4 mg  total) by mouth every 6 (six) hours as needed for nausea., Disp: 20 tablet, Rfl: 0   oxyCODONE (OXY IR/ROXICODONE) 5 MG immediate release tablet, Take 1 tablet (5 mg total) by mouth every 6 (six) hours as needed for moderate pain., Disp: 28 tablet, Rfl: 0   senna-docusate (SENOKOT-S) 8.6-50 MG tablet, Take 1 tablet by mouth daily., Disp: 30 tablet, Rfl: 0   witch hazel-glycerin (TUCKS) pad, Apply topically as needed for itching., Disp: 40 each, Rfl: 12   Zinc Sulfate 220 (50 Zn) MG TABS, Take 1 tablet (220 mg total) by mouth daily., Disp: 30 tablet, Rfl: 0 No current facility-administered medications for this visit.  Facility-Administered Medications Ordered in Other Visits:    heparin lock flush 100 UNIT/ML injection, , , ,   Allergies  Allergen Reactions   Other Shortness Of Breath    Cat dander - shortness of breath Grass pollen - shortness of breath   Platinol [Cisplatin] Itching   Bydureon [Exenatide] Other (See Comments)    Too painful.    I personally  reviewed active problem list, medication list, allergies, family history, social history, health maintenance with the patient/caregiver today.   ROS  Ten systems reviewed and is negative except as mentioned in HPI   Objective  Vitals:   11/05/22 1114  BP: 110/70  Pulse: 86  Resp: 16  Temp: 97.7 F (36.5 C)  TempSrc: Oral  SpO2: 100%  Weight: 171 lb 9.6 oz (77.8 kg)  Height: 5\' 7"  (1.702 m)    Body mass index is 26.88 kg/m.  Physical Exam  Constitutional: Patient appears  frail, uncomfortable looking in her wheelchair. No distress.  HEENT: head atraumatic, normocephalic, pupils equal and reactive to light, conjunctiva icteric ,, neck supple Cardiovascular: Normal rate, regular rhythm and normal heart sounds.  No murmur heard. No BLE edema. Pulmonary/Chest: Effort normal and breath sounds normal. No respiratory distress. Abdominal: Soft.  There is no tenderness. Skin: superficial decubitus ulcer of buttocks  Psychiatric: Patient appears depressed, cried during the visit    PHQ2/9:    11/05/2022   11:13 AM 09/25/2022   11:36 AM 02/03/2022   10:56 AM 12/26/2021   10:23 AM 11/13/2021    1:01 PM  Depression screen PHQ 2/9  Decreased Interest 0 0 1 0 0  Down, Depressed, Hopeless 0 1 1 0 0  PHQ - 2 Score 0 1 2 0 0  Altered sleeping 0 0 0 0 0  Tired, decreased energy 0 0 0 0 0  Change in appetite 0 0 0 0 0  Feeling bad or failure about yourself  0 0 0 0 0  Trouble concentrating 0 0 0 0 0  Moving slowly or fidgety/restless 0 0 0 0 0  Suicidal thoughts 0 0 0 0 0  PHQ-9 Score 0 1 2 0 0  Difficult doing work/chores Not difficult at all  Not difficult at all  Not difficult at all    phq 9 is negative score but patient is depressed    Fall Risk:    11/05/2022   11:13 AM 09/25/2022   11:36 AM 02/03/2022   11:02 AM 12/26/2021   10:22 AM 11/13/2021   12:57 PM  Fall Risk   Falls in the past year? 1 0 0 0 0  Number falls in past yr: 1 0 0 0 0  Injury with Fall? 1 0 0 0 0   Risk for fall due to : Impaired balance/gait No Fall Risks No Fall Risks No  Fall Risks   Follow up Falls prevention discussed;Education provided;Falls evaluation completed Falls prevention discussed Falls prevention discussed Falls prevention discussed       Functional Status Survey: Is the patient deaf or have difficulty hearing?: No Does the patient have difficulty seeing, even when wearing glasses/contacts?: No Does the patient have difficulty concentrating, remembering, or making decisions?: No Does the patient have difficulty walking or climbing stairs?: No Does the patient have difficulty dressing or bathing?: No Does the patient have difficulty doing errands alone such as visiting a doctor's office or shopping?: No    Assessment & Plan   1. Hospital discharge follow-up  She is still on wheelchair, requiring more care, cannot stand on her own, son needs times off work to help her , we will try getting her CAP   2. H/O neck surgery  She is in pain, she has follow up with Dr. Cari Caraway next week, advised to contact his office  3. Pressure injury of left buttock, stage 1  She is waiting to have home health - start tomorrow

## 2022-11-05 ENCOUNTER — Ambulatory Visit (INDEPENDENT_AMBULATORY_CARE_PROVIDER_SITE_OTHER): Payer: Medicare Other | Admitting: Family Medicine

## 2022-11-05 ENCOUNTER — Encounter: Payer: Self-pay | Admitting: Family Medicine

## 2022-11-05 VITALS — BP 110/70 | HR 86 | Temp 97.7°F | Resp 16 | Ht 67.0 in | Wt 171.6 lb

## 2022-11-05 DIAGNOSIS — L89321 Pressure ulcer of left buttock, stage 1: Secondary | ICD-10-CM

## 2022-11-05 DIAGNOSIS — Z09 Encounter for follow-up examination after completed treatment for conditions other than malignant neoplasm: Secondary | ICD-10-CM

## 2022-11-05 DIAGNOSIS — Z9889 Other specified postprocedural states: Secondary | ICD-10-CM

## 2022-11-06 ENCOUNTER — Other Ambulatory Visit: Payer: Self-pay | Admitting: Family Medicine

## 2022-11-06 DIAGNOSIS — J4489 Other specified chronic obstructive pulmonary disease: Secondary | ICD-10-CM

## 2022-11-10 ENCOUNTER — Inpatient Hospital Stay (HOSPITAL_BASED_OUTPATIENT_CLINIC_OR_DEPARTMENT_OTHER): Payer: Medicare Other | Admitting: Oncology

## 2022-11-10 ENCOUNTER — Inpatient Hospital Stay: Payer: Medicare Other | Attending: Nurse Practitioner

## 2022-11-10 ENCOUNTER — Encounter: Payer: Self-pay | Admitting: Oncology

## 2022-11-10 VITALS — BP 118/70 | HR 78 | Temp 97.6°F | Resp 16 | Wt 162.5 lb

## 2022-11-10 DIAGNOSIS — Z90710 Acquired absence of both cervix and uterus: Secondary | ICD-10-CM | POA: Diagnosis not present

## 2022-11-10 DIAGNOSIS — I10 Essential (primary) hypertension: Secondary | ICD-10-CM | POA: Diagnosis not present

## 2022-11-10 DIAGNOSIS — D696 Thrombocytopenia, unspecified: Secondary | ICD-10-CM | POA: Insufficient documentation

## 2022-11-10 DIAGNOSIS — C221 Intrahepatic bile duct carcinoma: Secondary | ICD-10-CM | POA: Insufficient documentation

## 2022-11-10 DIAGNOSIS — E119 Type 2 diabetes mellitus without complications: Secondary | ICD-10-CM | POA: Diagnosis not present

## 2022-11-10 DIAGNOSIS — Z7189 Other specified counseling: Secondary | ICD-10-CM

## 2022-11-10 DIAGNOSIS — R748 Abnormal levels of other serum enzymes: Secondary | ICD-10-CM | POA: Insufficient documentation

## 2022-11-10 DIAGNOSIS — Z95828 Presence of other vascular implants and grafts: Secondary | ICD-10-CM

## 2022-11-10 LAB — COMPREHENSIVE METABOLIC PANEL
ALT: 37 U/L (ref 0–44)
AST: 51 U/L — ABNORMAL HIGH (ref 15–41)
Albumin: 1.8 g/dL — ABNORMAL LOW (ref 3.5–5.0)
Alkaline Phosphatase: 350 U/L — ABNORMAL HIGH (ref 38–126)
Anion gap: 5 (ref 5–15)
BUN: 9 mg/dL (ref 8–23)
CO2: 21 mmol/L — ABNORMAL LOW (ref 22–32)
Calcium: 7.6 mg/dL — ABNORMAL LOW (ref 8.9–10.3)
Chloride: 112 mmol/L — ABNORMAL HIGH (ref 98–111)
Creatinine, Ser: 1.18 mg/dL — ABNORMAL HIGH (ref 0.44–1.00)
GFR, Estimated: 52 mL/min — ABNORMAL LOW (ref >60)
Glucose, Bld: 180 mg/dL — ABNORMAL HIGH (ref 70–99)
Potassium: 3.6 mmol/L (ref 3.5–5.1)
Sodium: 138 mmol/L (ref 135–145)
Total Bilirubin: 3.4 mg/dL — ABNORMAL HIGH (ref 0.3–1.2)
Total Protein: 7.8 g/dL (ref 6.5–8.1)

## 2022-11-10 LAB — CBC WITH DIFFERENTIAL/PLATELET
Abs Immature Granulocytes: 0.03 10*3/uL (ref 0.00–0.07)
Basophils Absolute: 0 10*3/uL (ref 0.0–0.1)
Basophils Relative: 1 %
Eosinophils Absolute: 0.1 10*3/uL (ref 0.0–0.5)
Eosinophils Relative: 2 %
HCT: 29.4 % — ABNORMAL LOW (ref 36.0–46.0)
Hemoglobin: 9.1 g/dL — ABNORMAL LOW (ref 12.0–15.0)
Immature Granulocytes: 1 %
Lymphocytes Relative: 27 %
Lymphs Abs: 1.6 10*3/uL (ref 0.7–4.0)
MCH: 30.3 pg (ref 26.0–34.0)
MCHC: 31 g/dL (ref 30.0–36.0)
MCV: 98 fL (ref 80.0–100.0)
Monocytes Absolute: 0.6 10*3/uL (ref 0.1–1.0)
Monocytes Relative: 10 %
Neutro Abs: 3.6 10*3/uL (ref 1.7–7.7)
Neutrophils Relative %: 59 %
Platelets: 95 10*3/uL — ABNORMAL LOW (ref 150–400)
RBC: 3 MIL/uL — ABNORMAL LOW (ref 3.87–5.11)
RDW: 22.8 % — ABNORMAL HIGH (ref 11.5–15.5)
WBC: 6 10*3/uL (ref 4.0–10.5)
nRBC: 0 % (ref 0.0–0.2)

## 2022-11-10 MED ORDER — HEPARIN SOD (PORK) LOCK FLUSH 100 UNIT/ML IV SOLN
500.0000 [IU] | Freq: Once | INTRAVENOUS | Status: AC
  Administered 2022-11-10: 500 [IU] via INTRAVENOUS
  Filled 2022-11-10: qty 5

## 2022-11-10 MED ORDER — SODIUM CHLORIDE 0.9% FLUSH
10.0000 mL | Freq: Once | INTRAVENOUS | Status: AC
  Administered 2022-11-10: 10 mL via INTRAVENOUS
  Filled 2022-11-10: qty 10

## 2022-11-10 NOTE — Progress Notes (Signed)
Hematology/Oncology Consult note [facility]  [phone] [phone]  Patient Care Team: Steele Sizer, MD as PCP - General (Family Medicine) Clent Jacks, RN as Oncology Nurse Navigator Germaine Pomfret, [facility] (Pharmacist) Sindy Guadeloupe, MD as Consulting Physician (Oncology) Borders, Kirt Boys, NP as Nurse Practitioner [facility] and Palliative Medicine)   Name of the patient: Janice Short  [id number]  1961/03/21   Date of visit: 11/10/22  Diagnosis- metastatic intrahepatic cholangiocarcinoma   Chief complaint/ Reason for visit-posthospital discharge follow-up  Heme/Onc history: Patient is a 61 year old African-American female with a past medical history significant for hypertension hyperlipidemia and type 2 diabetes who presented to the ER with symptoms of cramping abdominal pain and nausea.  This was followed by right upper quadrant ultrasound which showed multiple echogenic masses in the liver with nodular contour of the liver possibly suggestive of cirrhosis.  Several nodular masses in the vicinity of the pancreatic head and porta hepatic lymph nodes.  This was followed by an MRI abdomen and MRCP.  It showed right hepatic lobe masslike area measuring 4.9 x 2.9 cm.  Diffusion abnormality in the left hepatic lobe with mild left-sided biliary ductal dilatation and another area of abnormality measuring 1.4 x 1 cm.  Bulky celiac adenopathy 3 x 2.1 cm tracking into gastrohepatic recess.  Suspected portal venous invasion with convex protrusion in the right portal vein.  Left adrenal lesion measuring 2.9 x 1.5 cm.  Findings concerning for cholangiocarcinoma or biphenotypic cholangiole hepatocellular neoplasm.     Patient underwent CT-guided liver biopsy Which was consistent with adenocarcinoma poorly differentiated.  Immunohistochemistry showed CK7 positivity, CDX2 negative, Heppar negative, GATA3 negative.  CK19 positive.  Differentials include  cholangiocarcinoma, pancreatic, upper GI.  Lung and breast less likely.  However given CK19 positivity most compatible with cholangiocarcinoma.   Patient's case was discussed at tumor board and it was felt as if the lymphadenopathy was more like a confluent primary tumor mass.  Patient was referred to [facility] for second opinion to see if she would be a candidate for surgery.However upon their review of the PET scan it was deemed that this was indeed periportal adenopathy.  They also did a CT chest abdomen and pelvis with MIPS protocol and she was also found to have portocaval lymph node measuring 2.4 cm which was more consistent with metastatic disease.  She was deemed unresectable   NGS testing showed BRCA2, FGFR2, PIK3CA, RAD 54L, CDC73, CDKN2 A/B CR EBBP rearrangement exon 31    Patient has been getting gemcitabine cisplatin chemotherapy 2 weeks on 1 week off since August 2022.  Patient had an allergic reaction to cisplatin and therefore was switched to carboplatin gemcitabine chemotherapy which she gets week on week off  Interval history-patient had a mechanical fall on 10/01/2022 and suffered a laceration of the left cheek.  CT cervical spine showed a drop type fracture involving the C2 vertebral body for which she underwent cervical discectomy and decompression fusion surgery and was eventually discharged to a rehab.  She still has a cervical collar in place.  She is undergoing home physical therapy as well and mobility is slowly coming back.  She is able to ambulate a few steps with a walker but does need help with ADLs many times.  ECOG PS- 3 Pain scale- 3 Opioid associated constipation- no  Review of systems- Review of Systems  Constitutional:  Positive for malaise/fatigue. Negative for chills, fever and weight loss.  HENT:  Negative for  congestion, ear discharge and nosebleeds.   Eyes:  Negative for blurred vision.  Respiratory:  Negative for cough, hemoptysis, sputum production, shortness  of breath and wheezing.   Cardiovascular:  Negative for chest pain, palpitations, orthopnea and claudication.  Gastrointestinal:  Negative for abdominal pain, blood in stool, constipation, diarrhea, heartburn, melena, nausea and vomiting.  Genitourinary:  Negative for dysuria, flank pain, frequency, hematuria and urgency.  Musculoskeletal:  Positive for neck pain. Negative for back pain, joint pain and myalgias.  Skin:  Negative for rash.  Neurological:  Positive for weakness. Negative for dizziness, tingling, focal weakness, seizures and headaches.  Endo/Heme/Allergies:  Does not bruise/bleed easily.  Psychiatric/Behavioral:  Negative for depression and suicidal ideas. The patient does not have insomnia.       Allergies  Allergen Reactions   Other Shortness Of Breath    Cat dander - shortness of breath Grass pollen - shortness of breath   Platinol [Cisplatin] Itching   Bydureon [Exenatide] Other (See Comments)    Too painful.     Past Medical History:  Diagnosis Date   Allergy    Asthma    Carpal tunnel syndrome on left    Cervical radiculitis    Chronic osteoarthritis    Corns and callosity    Depression    Dermatophytosis of foot    Diabetes mellitus without complication (HCC)    Gout    Hyperlipidemia    Hypertension    Impingement syndrome of left shoulder    Intrahepatic cholangiocarcinoma (HCC)    Lumbosacral neuritis    Microalbuminuria    Obesity    Supraventricular tachycardia 07/01/2015   SVT (supraventricular tachycardia)    Tenosynovitis of wrist    Vitamin D deficiency      Past Surgical History:  Procedure Laterality Date   ABDOMINAL HYSTERECTOMY     ANTERIOR CERVICAL DECOMP/DISCECTOMY FUSION N/A 10/02/2022   Procedure: ANTERIOR CERVICAL DECOMPRESSION/DISCECTOMY FUSION 1 LEVEL;  Surgeon: Meade Maw, MD;  Location: [facility] ORS;  Service: Neurosurgery;  Laterality: N/A;  ACDF C4-5.   COLONOSCOPY WITH PROPOFOL N/A 05/06/2021   Procedure:  COLONOSCOPY WITH PROPOFOL;  Surgeon: Lucilla Lame, MD;  Location: [facility] ENDOSCOPY;  Service: Endoscopy;  Laterality: N/A;   ESOPHAGOGASTRODUODENOSCOPY (EGD) WITH PROPOFOL N/A 05/06/2021   Procedure: ESOPHAGOGASTRODUODENOSCOPY (EGD) WITH PROPOFOL;  Surgeon: Lucilla Lame, MD;  Location: [facility] END[facility]COPY;  Service: Endoscopy;  Laterality: N/A;   PORTA CATH INSERTION N/A 07/17/2021   Procedure: PORTA CATH INSERTION;  Surgeon: Algernon Huxley, MD;  Location: [place] CV LAB;  Service: Cardiovascular;  Laterality: N/A;    Social History   Socioeconomic History   Marital status: Single    Spouse name: Not on file   Number of children: 2   Years of education: Not on file   Highest education level: Not on file  Occupational History   Occupation: disable     Comment: from chronic back pain, 2019  Tobacco Use   Smoking status: Some Days    Types: Cigarettes   Smokeless tobacco: Never   Tobacco comments:    1 per day 01/30/21 does not inhale it  Vaping Use   Vaping Use: Never used  Substance and Sexual Activity   Alcohol use: Not Currently   Drug use: Not Currently    Types: Benzodiazepines   Sexual activity: Not Currently  Other Topics Concern   Not on file  Social History Narrative   She is on disability for chronic back pain. Pt's son lives with her  Social Determinants of Health   Financial Resource Strain: Medium Risk (02/03/2022)   Overall Financial Resource Strain (CARDIA)    Difficulty of Paying Living Expenses: Somewhat hard  Food Insecurity: Food Insecurity Present (02/03/2022)   Hunger Vital Sign    Worried About Running Out of Food in the Last Year: Sometimes true    Ran Out of Food in the Last Year: Never true  Transportation Needs: No Transportation Needs (04/24/2022)   PRAPARE - Hydrologist (Medical): No    Lack of Transportation (Non-Medical): No  Physical Activity: Inactive (02/03/2022)   Exercise Vital Sign    Days of Exercise per Week: 0  days    Minutes of Exercise per Session: 0 min  Stress: No Stress Concern Present (02/03/2022)   [place]    Feeling of Stress : Only a little  Social Connections: Moderately Isolated (02/03/2022)   Social Connection and Isolation Panel [NHANES]    Frequency of Communication with Friends and Family: More than three times a week    Frequency of Social Gatherings with Friends and Family: Three times a week    Attends Religious Services: More than 4 times per year    Active Member of Clubs or Organizations: No    Attends Archivist Meetings: Never    Marital Status: Never married  Intimate Partner Violence: Not At Risk (02/03/2022)   Humiliation, Afraid, Rape, and Kick questionnaire    Fear of Current or Ex-Partner: No    Emotionally Abused: No    Physically Abused: No    Sexually Abused: No    Family History  Problem Relation Age of Onset   Heart attack Mother    CAD Mother    Kidney disease Father      Current Outpatient Medications:    albuterol (VENTOLIN HFA) 108 (90 Base) MCG/ACT inhaler, INHALE TWO PUFFS BY MOUTH EVERY 6 HOURS AS NEEDED FOR WHEEZING OR FOR SHORTNESS OF BREATH (BULK), Disp: 8.5 g, Rfl: 0   amLODipine (NORVASC) 2.5 MG tablet, Take 1 tablet (2.5 mg total) by mouth daily., Disp: 30 tablet, Rfl: 0   atorvastatin (LIPITOR) 40 MG tablet, Take 1 tablet (40 mg total) by mouth daily., Disp: 90 tablet, Rfl: 1   calcium carbonate (OSCAL) 1500 (600 Ca) MG TABS tablet, Take 1 tablet (1,500 mg total) by mouth 3 (three) times daily with meals., Disp: 90 tablet, Rfl: 0   diclofenac Sodium (VOLTAREN) 1 % GEL, Apply 2 g topically 4 (four) times daily., Disp: 350 g, Rfl: 0   escitalopram (LEXAPRO) 20 MG tablet, TAKE ONE TABLET BY MOUTH DAILY AT 9AM, Disp: 90 tablet, Rfl: 1   fluticasone (FLONASE) 50 MCG/ACT nasal spray, Place 2 sprays into both nostrils daily as needed. USE 2 SPRAY(S) IN EACH NOSTRIL AT  BEDTIME, Disp: 16 g, Rfl: 0   gabapentin (NEURONTIN) 300 MG capsule, TAKE TWO CAPSULES BY MOUTH TWICE DAILY @ 9AM & 5PM, Disp: 360 capsule, Rfl: 1   hydrocortisone cream 1 %, Apply topically 2 (two) times daily., Disp: 30 g, Rfl: 0   insulin degludec (TRESIBA FLEXTOUCH) 100 UNIT/ML FlexTouch Pen, Inject 11 Units into the skin at bedtime., Disp: 15 mL, Rfl: 1   Insulin Pen Needle 32G X 4 MM MISC, Use at bedtime with insulin, Disp: 100 each, Rfl: 1   lidocaine-prilocaine (EMLA) cream, Apply 1 Application topically as needed. Apply to port and cover with saran wrap 1-2 hours prior  to port access, Disp: 30 g, Rfl: 3   methocarbamol (ROBAXIN) 500 MG tablet, Take 1 tablet (500 mg total) by mouth 4 (four) times daily., Disp: 120 tablet, Rfl: 0   senna-docusate (SENOKOT-S) 8.6-50 MG tablet, Take 1 tablet by mouth daily., Disp: 30 tablet, Rfl: 0   witch hazel-glycerin (TUCKS) pad, Apply topically as needed for itching., Disp: 40 each, Rfl: 12   Zinc Sulfate 220 (50 Zn) MG TABS, Take 1 tablet (220 mg total) by mouth daily., Disp: 30 tablet, Rfl: 0   amoxicillin (AMOXIL) 500 MG capsule, Take 1 capsule (500 mg total) by mouth 2 (two) times daily. (Patient not taking: Reported on 11/10/2022), Disp: 14 capsule, Rfl: 0   Fluticasone-Umeclidin-Vilant (TRELEGY ELLIPTA) 100-62.5-25 MCG/ACT AEPB, Inhale 1 puff into the lungs daily. (Patient not taking: Reported on 11/10/2022), Disp: 3 each, Rfl: 1   ondansetron (ZOFRAN) 4 MG tablet, Take 1 tablet (4 mg total) by mouth every 6 (six) hours as needed for nausea. (Patient not taking: Reported on 11/10/2022), Disp: 20 tablet, Rfl: 0   oxyCODONE (OXY IR/ROXICODONE) 5 MG immediate release tablet, Take 1 tablet (5 mg total) by mouth every 6 (six) hours as needed for moderate pain. (Patient not taking: Reported on 11/10/2022), Disp: 28 tablet, Rfl: 0 No current facility-administered medications for this visit.  Facility-Administered Medications Ordered in Other Visits:    heparin  lock flush 100 UNIT/ML injection, , , ,   Physical exam:  Vitals:   11/10/22 1052  BP: 118/70  Pulse: 78  Resp: 16  Temp: 97.6 F (36.4 C)  SpO2: 100%  Weight: 162 lb 8 oz (73.7 kg)   Physical Exam Constitutional:      Comments: Sitting in a wheelchair and has a cervical collar in place.  Limited mobility with rolling walker.  Cardiovascular:     Rate and Rhythm: Normal rate and regular rhythm.     Heart sounds: Normal heart sounds.  Pulmonary:     Effort: Pulmonary effort is normal.     Breath sounds: Normal breath sounds.  Abdominal:     General: Bowel sounds are normal.     Palpations: Abdomen is soft.  Skin:    General: Skin is warm and dry.  Neurological:     Mental Status: She is alert and oriented to person, place, and time.         Latest Ref Rng & Units 11/10/2022   10:14 AM  CMP  Glucose 70 - 99 mg/dL 180   BUN 8 - 23 mg/dL 9   Creatinine 0.44 - 1.00 mg/dL 1.18   Sodium 135 - 145 mmol/L 138   Potassium 3.5 - 5.1 mmol/L 3.6   Chloride 98 - 111 mmol/L 112   CO2 22 - 32 mmol/L 21   Calcium 8.9 - 10.3 mg/dL 7.6   Total Protein 6.5 - 8.1 g/dL 7.8   Total Bilirubin 0.3 - 1.2 mg/dL 3.4   Alkaline Phos 38 - 126 U/L 350   AST 15 - 41 U/L 51   ALT 0 - 44 U/L 37       Latest Ref Rng & Units 11/10/2022   10:14 AM  CBC  WBC 4.0 - 10.5 K/uL 6.0   Hemoglobin 12.0 - 15.0 g/dL 9.1   Hematocrit 36.0 - 46.0 % 29.4   Platelets 150 - 400 K/uL 95     No images are attached to the encounter.  DG Chest 2 View  Result Date: 10/12/2022 CLINICAL DATA:  Leukocytosis EXAM:  CHEST - 2 VIEW COMPARISON:  Chest x-ray October 07, 2022 FINDINGS: Right chest wall port catheter terminates in the right atrium. The cardiomediastinal silhouette is unchanged in contour. Right basilar bandlike opacity. No pleural effusion or pneumothorax. The visualized upper abdomen is unremarkable. No acute osseous abnormality. IMPRESSION: Right basilar bandlike opacity, likely atelectasis. However,  aspiration/infection could appear similarly in the appropriate clinical context. Electronically Signed   By: Beryle Flock M.D.   On: 10/12/2022 15:37     Assessment and plan- Patient is a 61 y.o. female with metastatic intrahepatic cholangiocarcinoma.  She has been on gemcitabine carboplatin chemotherapy until October 2023.  This is a posthospital discharge follow-up  Patient has been off chemotherapy for about 2 months now after she had a fall in late October 2023 causing a cervical fracture requiring surgery followed by rehab.  Patient is still quite deconditioned at this time and is barely able to take a few steps with a walker.  Albumin levels are down to 1.8.  Total bilirubin is up to 3.4 with elevated alkaline phosphatase which could be secondary to her cholangiocarcinoma.  I am holding off on restarting any chemotherapy at this time due to her performance status.  I will plan to repeat CT chest abdomen pelvis with contrast in about 2 to 3 weeks time and see her thereafter.  She will take the next couple of weeks to recover further and focus on her nutrition and protein intake before we can consider any treatment for her  Thrombocytopenia: Possibly secondary to acute events.  It is improving gradually but still not good enough to proceed with chemotherapy.  Continue to monitor   Visit Diagnosis 1. Intrahepatic cholangiocarcinoma ([place])   2. Goals of care, counseling/discussion   3. Thrombocytopenia ([place])      Dr. Randa Evens, MD, MPH [facility] at [facility] [phone] 11/10/2022 12:32 PM

## 2022-11-11 ENCOUNTER — Other Ambulatory Visit: Payer: Self-pay

## 2022-11-11 DIAGNOSIS — Z981 Arthrodesis status: Secondary | ICD-10-CM

## 2022-11-12 ENCOUNTER — Encounter: Payer: Self-pay | Admitting: Neurosurgery

## 2022-11-16 ENCOUNTER — Inpatient Hospital Stay (HOSPITAL_BASED_OUTPATIENT_CLINIC_OR_DEPARTMENT_OTHER): Payer: Medicare Other | Admitting: Hospice and Palliative Medicine

## 2022-11-16 ENCOUNTER — Inpatient Hospital Stay: Payer: Medicare Other | Admitting: Family Medicine

## 2022-11-16 ENCOUNTER — Other Ambulatory Visit: Payer: Self-pay

## 2022-11-16 DIAGNOSIS — Z981 Arthrodesis status: Secondary | ICD-10-CM

## 2022-11-16 DIAGNOSIS — C221 Intrahepatic bile duct carcinoma: Secondary | ICD-10-CM

## 2022-11-16 NOTE — Progress Notes (Unsigned)
   REFERRING PHYSICIAN:  Steele Sizer, [address] [place],  [place] [address]  DOS: 10/02/22 - C4-5 ACDF for central cord syndrome s/p fall.   HISTORY OF PRESENT ILLNESS: Janice Short is about 6 weeks status post ACDF C4-C5. She went to rehab after leaving the hospital. She was discharged back home on 10/28/22.   She continues with neck pain that radiates into both arms. She has been taking oxycodone, neurontin, and tylenol. She is not taking robaxin. She takes neurontin 600mg  bid for her DM. She feels like her upper extremity strength is improving. Her left side was more affected than the right. She is right hand dominant.     PHYSICAL EXAMINATION:  NEUROLOGICAL:  General: In no acute distress.   Awake, alert, oriented to person, place, and time.  Pupils equal round and reactive to light.  Facial tone is symmetric.    Strength: Side Biceps Triceps Deltoid Interossei Grip Wrist Ext. Wrist Flex.  R +4 +4 +4 +4 +4 +4 +4  L 4 4- 3 3 4  4- 4-   Incision well healed   Imaging:  Cervical xrays dated 11/17/22:  ACDF C4-C5 with hardware intact.   Xray report not available. Xrays reviewed with Dr. Izora Ribas.    Assessment / Plan: Janice Short is doing well s/p above surgery. Still with mild pain, but her strength is improving. Treatment options reviewed with patient and following plan made:   - She is not doing any lifting, but we discussed that she has a 25 pound weight restriction.  - Continue current medications including prn oxycodone and robaxin. She is working on weaning from oxycodone. Will hold on further refills.  - She has been on neurontin 600mg  bid for DM. Will increase to 600mg  tid. Reviewed dosing and side effects.  - Okay to wean out of collar per Dr. Izora Ribas.  - Follow up as scheduled in 4-5 weeks and prn.   Advised to contact the office if any questions or concerns arise.   Geronimo Boot PA-C Dept of Neurosurgery

## 2022-11-16 NOTE — Progress Notes (Signed)
Unable to reach patient or leave a voicemail.  Will reschedule.

## 2022-11-17 ENCOUNTER — Ambulatory Visit (INDEPENDENT_AMBULATORY_CARE_PROVIDER_SITE_OTHER): Payer: Medicare Other | Admitting: Orthopedic Surgery

## 2022-11-17 ENCOUNTER — Ambulatory Visit
Admission: RE | Admit: 2022-11-17 | Discharge: 2022-11-17 | Disposition: A | Discharge status: Home / Self Care | Payer: Medicare Other | Attending: Neurosurgery | Admitting: Neurosurgery

## 2022-11-17 ENCOUNTER — Encounter: Payer: Self-pay | Admitting: Orthopedic Surgery

## 2022-11-17 ENCOUNTER — Ambulatory Visit
Admission: RE | Admit: 2022-11-17 | Discharge: 2022-11-17 | Disposition: A | Discharge status: Home / Self Care | Payer: Medicare Other | Source: Ambulatory Visit | Attending: Neurosurgery | Admitting: Neurosurgery

## 2022-11-17 VITALS — BP 118/68 | Temp 97.8°F | Ht 67.0 in | Wt 162.0 lb

## 2022-11-17 DIAGNOSIS — M532X2 Spinal instabilities, cervical region: Secondary | ICD-10-CM

## 2022-11-17 DIAGNOSIS — Z09 Encounter for follow-up examination after completed treatment for conditions other than malignant neoplasm: Secondary | ICD-10-CM

## 2022-11-17 DIAGNOSIS — Z981 Arthrodesis status: Secondary | ICD-10-CM | POA: Diagnosis not present

## 2022-11-17 DIAGNOSIS — S14109D Unspecified injury at unspecified level of cervical spinal cord, subsequent encounter: Secondary | ICD-10-CM

## 2022-11-17 DIAGNOSIS — S12101D Unspecified nondisplaced fracture of second cervical vertebra, subsequent encounter for fracture with routine healing: Secondary | ICD-10-CM

## 2022-11-17 DIAGNOSIS — W06XXXD Fall from bed, subsequent encounter: Secondary | ICD-10-CM

## 2022-11-17 DIAGNOSIS — M4802 Spinal stenosis, cervical region: Secondary | ICD-10-CM

## 2022-11-17 DIAGNOSIS — S14129S Central cord syndrome at unspecified level of cervical spinal cord, sequela: Secondary | ICD-10-CM

## 2022-11-17 DIAGNOSIS — S14129D Central cord syndrome at unspecified level of cervical spinal cord, subsequent encounter: Secondary | ICD-10-CM

## 2022-11-17 NOTE — Patient Instructions (Addendum)
It was nice to see you today.   I am glad that you are feeling better! I  Okay to get incision wet in the shower, do not submerge in pool or hot tub.   Call if any concerns about the incision such as redness, drainage, or fever/chills.   No heavy lifting (more than 25 pounds).   You are taking gabapentin 600mg  (2 of the 300mg ) twice a day. I want you to starting taking gabapentin 600mg  three times a day. Let me know if you have any issues with this and if you need a refill.   You may wean out of your collar!  We will see you back in 4 weeks for your follow up.   Please call with any questions or concerns.   Geronimo Boot PA-C [phone]

## 2022-11-19 ENCOUNTER — Telehealth: Payer: Self-pay | Admitting: Family Medicine

## 2022-11-19 DIAGNOSIS — J4489 Other specified chronic obstructive pulmonary disease: Secondary | ICD-10-CM

## 2022-11-20 ENCOUNTER — Ambulatory Visit: Admission: RE | Admit: 2022-11-20 | Payer: Medicare Other | Source: Ambulatory Visit

## 2022-11-20 ENCOUNTER — Telehealth: Payer: Self-pay | Admitting: Family Medicine

## 2022-11-20 NOTE — Telephone Encounter (Signed)
Refused Ventolin Duplicate order

## 2022-11-20 NOTE — Telephone Encounter (Signed)
Home Health Verbal Orders - Caller/Agency: Tiffany/[facility] Callback Number: [phone]  opt 2 Requesting : help at home and  Frequency: ?

## 2022-11-23 ENCOUNTER — Telehealth: Payer: Self-pay

## 2022-11-23 DIAGNOSIS — C221 Intrahepatic bile duct carcinoma: Secondary | ICD-10-CM

## 2022-11-23 DIAGNOSIS — I10 Essential (primary) hypertension: Secondary | ICD-10-CM

## 2022-11-23 NOTE — Telephone Encounter (Unsigned)
Copied from [place] [id number]. Topic: General - Other >> Nov 20, 2022  4:31 PM [place] B wrote: Reason for CRM: tiffany from [facility] home [facility] states patient is requesting to peak with social worker about financial assistance. Please cb patient

## 2022-11-23 NOTE — Telephone Encounter (Signed)
  Care Management   Visit Note  11/23/2022 Name: Janice Short MRN: [id number] DOB: [birth year]  Subjective: Janice Short is a 61 y.o. year old female who is a primary care patient of Steele Sizer, MD. The Care Management team was consulted for assistance.      Engaged with patient  by telephone .   Assessment:  Janice Short contacted the clinic to request assistance with meals, transportation and utilities.  Reports having all needed medications and taking as prescribed. Declined need for assistance with medication management or prescription cost.  Interventions:   SDOH Screenings   Food Insecurity: Food Insecurity Present (11/23/2022)  Housing: Low Risk  (11/23/2022)  Transportation Needs: Unmet Transportation Needs (11/23/2022)  Utilities: At Risk (11/23/2022)  Alcohol Screen: Low Risk  (02/03/2022)  Depression (PHQ2-9): Low Risk  (11/23/2022)  Financial Resource Strain: High Risk (11/23/2022)  Physical Activity: Inactive (02/03/2022)  Social Connections: Moderately Isolated (02/03/2022)  Stress: Stress Concern Present (11/23/2022)  Tobacco Use: High Risk (11/17/2022)   Care Guide referral for placed for meals, transportation and utilities.  Consent to Services:  Janice Short declined need for ongoing assistance with chronic disease and medication management. Agreeable to outreach with a [employer] to discuss options for assistance with meals, transportation and utilities.   PLAN:  Referral placed for SDOH needs. Janice Short will contact the clinic if additional assistance is required.   Horris Latino RN Care Manager/Chronic Care Management [phone]

## 2022-11-24 ENCOUNTER — Telehealth: Payer: Self-pay | Admitting: *Deleted

## 2022-11-24 ENCOUNTER — Other Ambulatory Visit: Payer: Medicare Other

## 2022-11-24 ENCOUNTER — Ambulatory Visit: Payer: Medicare Other | Admitting: Oncology

## 2022-11-24 NOTE — Progress Notes (Signed)
  Care Coordination  Outreach Note  11/24/2022 Name: Janice Short MRN: [id number] DOB: November 28, 1961   Care Coordination Outreach Attempts: An unsuccessful telephone outreach was attempted today to offer the patient information about available care coordination services as a benefit of their health plan.   Referral received   Follow Up Plan:  Additional outreach attempts will be made to offer the patient care coordination information and services.   Encounter Outcome:  No Answer  Julian Hy, [place] Direct Dial: [phone]

## 2022-11-25 ENCOUNTER — Ambulatory Visit: Admission: RE | Admit: 2022-11-25 | Payer: Medicare Other | Source: Ambulatory Visit

## 2022-11-27 ENCOUNTER — Inpatient Hospital Stay: Payer: Medicare Other

## 2022-11-27 ENCOUNTER — Telehealth: Payer: Self-pay | Admitting: *Deleted

## 2022-11-27 ENCOUNTER — Inpatient Hospital Stay: Payer: Medicare Other | Admitting: Oncology

## 2022-11-27 ENCOUNTER — Other Ambulatory Visit: Payer: Self-pay | Admitting: Family Medicine

## 2022-11-27 ENCOUNTER — Other Ambulatory Visit: Payer: Self-pay

## 2022-11-27 DIAGNOSIS — E1169 Type 2 diabetes mellitus with other specified complication: Secondary | ICD-10-CM

## 2022-11-27 DIAGNOSIS — C221 Intrahepatic bile duct carcinoma: Secondary | ICD-10-CM

## 2022-11-27 DIAGNOSIS — E1142 Type 2 diabetes mellitus with diabetic polyneuropathy: Secondary | ICD-10-CM

## 2022-11-27 NOTE — Telephone Encounter (Signed)
Requested medication (s) are due for refill today: yes  Requested medication (s) are on the active medication list: yes  Last refill:  10/27/22 #28/0  Future visit scheduled: yes  Notes to clinic:  not delegated but prescribed from rehab.      Requested Prescriptions  Pending Prescriptions Disp Refills   oxyCODONE (OXY IR/ROXICODONE) 5 MG immediate release tablet 28 tablet 0    Sig: Take 1 tablet (5 mg total) by mouth every 6 (six) hours as needed for moderate pain.     Not Delegated - Analgesics:  Opioid Agonists Failed - 11/27/2022 10:27 AM      Failed - This refill cannot be delegated      Failed - Urine Drug Screen completed in last 360 days      Passed - Valid encounter within last 3 months    Recent Outpatient Visits           3 weeks ago Hospital discharge follow-up   [facility] Steele Sizer, MD   2 months ago Diabetic polyneuropathy associated with type 2 diabetes mellitus [facility])   [facility] Steele Sizer, MD   11 months ago Diabetes mellitus type 2 with complications [facility])   [facility] Steele Sizer, MD   1 year ago Chronic left shoulder pain   [facility] Teodora Medici, DO   1 year ago Diabetes mellitus type 2 with complications [facility])   [facility] Steele Sizer, MD       Future Appointments             In 2 weeks [place], Jinny Blossom, MD [place], CPR   In 1 month Steele Sizer, MD [facility], [place]   In 2 months  [facility], [facility]

## 2022-11-27 NOTE — Telephone Encounter (Signed)
Tiffany from [facility] is calling back in regards to the patient needing help with food and utilities. Per Tiffany she has a Education officer, museum that can go out to see the patient today but she is needing to speak with Chrystal first.  Spoke with [employer], she will call Tiffany back [employer] phone # [phone]  opt 2)

## 2022-11-27 NOTE — Telephone Encounter (Unsigned)
Copied from [place] [id number]. Topic: General - Other >> Nov 27, 2022 10:00 AM Everette C wrote: Reason for CRM: Medication Refill - Medication: Rx #: [id number]  oxyCODONE (OXY IR/ROXICODONE) 5 MG immediate release tablet [[id number]]    Has the patient contacted their pharmacy? No.   (Agent: If no, request that the patient contact the pharmacy for the refill. If patient does not wish to contact the pharmacy document the reason why and proceed with request.) (Agent: If yes, when and what did the pharmacy advise?)  Preferred Pharmacy (with phone number or street name): [place], [place] - [address] [place] [address] Phone: [phone] Fax: [phone] Hours: Not open 24 hours   Has the patient been seen for an appointment in the last year OR does the patient have an upcoming appointment? Yes.    Agent: Please be advised that RX refills may take up to 3 business days. We ask that you follow-up with your pharmacy.

## 2022-12-01 NOTE — Patient Outreach (Signed)
Late entry 11/27/22  Tiffany from [facility] called the [facility] requesting  a call back. Patient needing assistance with food and utilities.Tiffany she has a Education officer, museum that can go out to see the patient today and is asking for verbal orders. Patient's provider contacted and verbal order received. [facility] to continue outreach to patient as well.  Elliot Gurney, [place] Worker  [facility] Care Management [phone]

## 2022-12-03 NOTE — Progress Notes (Signed)
  Care Coordination  Outreach Note  12/03/2022 Name: MAGIE CIAMPA MRN: [id number] DOB: December 13, 1960   Care Coordination Outreach Attempts: A second unsuccessful outreach was attempted today to offer the patient with information about available care coordination services as a benefit of their health plan.     Follow Up Plan:  Additional outreach attempts will be made to offer the patient care coordination information and services.   Encounter Outcome:  No Answer  Julian Hy, [place] Direct Dial: [phone]

## 2022-12-05 ENCOUNTER — Other Ambulatory Visit: Payer: Self-pay | Admitting: Family Medicine

## 2022-12-05 DIAGNOSIS — J4489 Other specified chronic obstructive pulmonary disease: Secondary | ICD-10-CM

## 2022-12-06 NOTE — Telephone Encounter (Signed)
Requested Prescriptions  Pending Prescriptions Disp Refills   albuterol (VENTOLIN HFA) 108 (90 Base) MCG/ACT inhaler [Pharmacy Med Name: albuterol sulfate HFA 90 mcg/actuation aerosol inhaler] 6.7 g 0    Sig: INHALE TWO PUFFS BY MOUTH EVERY 6 HOURS AS NEEDED FOR SHORTNESS OF BREATH OR WHEEZING     Pulmonology:  Beta Agonists 2 Passed - 12/05/2022  8:29 AM      Passed - Last BP in normal range    BP Readings from Last 1 Encounters:  11/17/22 118/68         Passed - Last Heart Rate in normal range    Pulse Readings from Last 1 Encounters:  11/10/22 78         Passed - Valid encounter within last 12 months    Recent Outpatient Visits           1 month ago Hospital discharge follow-up   [facility] Steele Sizer, MD   2 months ago Diabetic polyneuropathy associated with type 2 diabetes mellitus [facility])   [facility], Drue Stager, MD   11 months ago Diabetes mellitus type 2 with complications [facility])   [facility] Steele Sizer, MD   1 year ago Chronic left shoulder pain   [facility] Teodora Medici, DO   1 year ago Diabetes mellitus type 2 with complications [facility])   [facility] Steele Sizer, MD       Future Appointments             In 1 week Lovorn, Jinny Blossom, MD [place], CPR   In 1 month Steele Sizer, MD [facility], [place]   In 2 months  [place]

## 2022-12-08 DIAGNOSIS — E1122 Type 2 diabetes mellitus with diabetic chronic kidney disease: Secondary | ICD-10-CM | POA: Diagnosis not present

## 2022-12-08 DIAGNOSIS — I129 Hypertensive chronic kidney disease with stage 1 through stage 4 chronic kidney disease, or unspecified chronic kidney disease: Secondary | ICD-10-CM | POA: Diagnosis not present

## 2022-12-08 DIAGNOSIS — D631 Anemia in chronic kidney disease: Secondary | ICD-10-CM | POA: Diagnosis not present

## 2022-12-08 DIAGNOSIS — C221 Intrahepatic bile duct carcinoma: Secondary | ICD-10-CM | POA: Diagnosis not present

## 2022-12-08 DIAGNOSIS — S14122S Central cord syndrome at C2 level of cervical spinal cord, sequela: Secondary | ICD-10-CM | POA: Diagnosis not present

## 2022-12-08 DIAGNOSIS — G8252 Quadriplegia, C1-C4 incomplete: Secondary | ICD-10-CM | POA: Diagnosis not present

## 2022-12-08 DIAGNOSIS — E559 Vitamin D deficiency, unspecified: Secondary | ICD-10-CM | POA: Diagnosis not present

## 2022-12-08 DIAGNOSIS — E1142 Type 2 diabetes mellitus with diabetic polyneuropathy: Secondary | ICD-10-CM | POA: Diagnosis not present

## 2022-12-08 DIAGNOSIS — E785 Hyperlipidemia, unspecified: Secondary | ICD-10-CM | POA: Diagnosis not present

## 2022-12-08 DIAGNOSIS — M7542 Impingement syndrome of left shoulder: Secondary | ICD-10-CM | POA: Diagnosis not present

## 2022-12-08 DIAGNOSIS — K592 Neurogenic bowel, not elsewhere classified: Secondary | ICD-10-CM | POA: Diagnosis not present

## 2022-12-08 DIAGNOSIS — M170 Bilateral primary osteoarthritis of knee: Secondary | ICD-10-CM | POA: Diagnosis not present

## 2022-12-08 DIAGNOSIS — E1165 Type 2 diabetes mellitus with hyperglycemia: Secondary | ICD-10-CM | POA: Diagnosis not present

## 2022-12-08 DIAGNOSIS — M5440 Lumbago with sciatica, unspecified side: Secondary | ICD-10-CM | POA: Diagnosis not present

## 2022-12-08 DIAGNOSIS — M5010 Cervical disc disorder with radiculopathy, unspecified cervical region: Secondary | ICD-10-CM | POA: Diagnosis not present

## 2022-12-08 DIAGNOSIS — E440 Moderate protein-calorie malnutrition: Secondary | ICD-10-CM | POA: Diagnosis not present

## 2022-12-08 DIAGNOSIS — W19XXXS Unspecified fall, sequela: Secondary | ICD-10-CM | POA: Diagnosis not present

## 2022-12-08 DIAGNOSIS — G5602 Carpal tunnel syndrome, left upper limb: Secondary | ICD-10-CM | POA: Diagnosis not present

## 2022-12-08 DIAGNOSIS — I700 Atherosclerosis of aorta: Secondary | ICD-10-CM | POA: Diagnosis not present

## 2022-12-08 DIAGNOSIS — D696 Thrombocytopenia, unspecified: Secondary | ICD-10-CM | POA: Diagnosis not present

## 2022-12-08 DIAGNOSIS — S12191S Other nondisplaced fracture of second cervical vertebra, sequela: Secondary | ICD-10-CM | POA: Diagnosis not present

## 2022-12-08 DIAGNOSIS — M1030 Gout due to renal impairment, unspecified site: Secondary | ICD-10-CM | POA: Diagnosis not present

## 2022-12-08 DIAGNOSIS — J4489 Other specified chronic obstructive pulmonary disease: Secondary | ICD-10-CM | POA: Diagnosis not present

## 2022-12-08 DIAGNOSIS — N1831 Chronic kidney disease, stage 3a: Secondary | ICD-10-CM | POA: Diagnosis not present

## 2022-12-10 NOTE — Progress Notes (Signed)
  Care Coordination  Outreach Note  12/10/2022 Name: Janice Short MRN: [id number] DOB: 08-23-1961   Care Coordination Outreach Attempts: A third unsuccessful outreach was attempted today to offer the patient with information about available care coordination services as a benefit of their health plan.   Referral received   Follow Up Plan:  No further outreach attempts will be made at this time. We have been unable to contact the patient to offer or enroll patient in care coordination services  Encounter Outcome:  No Answer  Julian Hy, [place] Direct Dial: [phone]

## 2022-12-11 ENCOUNTER — Encounter: Payer: Self-pay | Admitting: Oncology

## 2022-12-11 DIAGNOSIS — M1030 Gout due to renal impairment, unspecified site: Secondary | ICD-10-CM | POA: Diagnosis not present

## 2022-12-11 DIAGNOSIS — S12191S Other nondisplaced fracture of second cervical vertebra, sequela: Secondary | ICD-10-CM | POA: Diagnosis not present

## 2022-12-11 DIAGNOSIS — D696 Thrombocytopenia, unspecified: Secondary | ICD-10-CM | POA: Diagnosis not present

## 2022-12-11 DIAGNOSIS — J4489 Other specified chronic obstructive pulmonary disease: Secondary | ICD-10-CM | POA: Diagnosis not present

## 2022-12-11 DIAGNOSIS — M170 Bilateral primary osteoarthritis of knee: Secondary | ICD-10-CM | POA: Diagnosis not present

## 2022-12-11 DIAGNOSIS — K592 Neurogenic bowel, not elsewhere classified: Secondary | ICD-10-CM | POA: Diagnosis not present

## 2022-12-11 DIAGNOSIS — M5010 Cervical disc disorder with radiculopathy, unspecified cervical region: Secondary | ICD-10-CM | POA: Diagnosis not present

## 2022-12-11 DIAGNOSIS — M5440 Lumbago with sciatica, unspecified side: Secondary | ICD-10-CM | POA: Diagnosis not present

## 2022-12-11 DIAGNOSIS — S14122S Central cord syndrome at C2 level of cervical spinal cord, sequela: Secondary | ICD-10-CM | POA: Diagnosis not present

## 2022-12-11 DIAGNOSIS — E1122 Type 2 diabetes mellitus with diabetic chronic kidney disease: Secondary | ICD-10-CM | POA: Diagnosis not present

## 2022-12-11 DIAGNOSIS — W19XXXS Unspecified fall, sequela: Secondary | ICD-10-CM | POA: Diagnosis not present

## 2022-12-11 DIAGNOSIS — E559 Vitamin D deficiency, unspecified: Secondary | ICD-10-CM | POA: Diagnosis not present

## 2022-12-11 DIAGNOSIS — E440 Moderate protein-calorie malnutrition: Secondary | ICD-10-CM | POA: Diagnosis not present

## 2022-12-11 DIAGNOSIS — D631 Anemia in chronic kidney disease: Secondary | ICD-10-CM | POA: Diagnosis not present

## 2022-12-11 DIAGNOSIS — I129 Hypertensive chronic kidney disease with stage 1 through stage 4 chronic kidney disease, or unspecified chronic kidney disease: Secondary | ICD-10-CM | POA: Diagnosis not present

## 2022-12-11 DIAGNOSIS — G5602 Carpal tunnel syndrome, left upper limb: Secondary | ICD-10-CM | POA: Diagnosis not present

## 2022-12-11 DIAGNOSIS — I700 Atherosclerosis of aorta: Secondary | ICD-10-CM | POA: Diagnosis not present

## 2022-12-11 DIAGNOSIS — G8252 Quadriplegia, C1-C4 incomplete: Secondary | ICD-10-CM | POA: Diagnosis not present

## 2022-12-11 DIAGNOSIS — M7542 Impingement syndrome of left shoulder: Secondary | ICD-10-CM | POA: Diagnosis not present

## 2022-12-11 DIAGNOSIS — C221 Intrahepatic bile duct carcinoma: Secondary | ICD-10-CM | POA: Diagnosis not present

## 2022-12-11 DIAGNOSIS — N1831 Chronic kidney disease, stage 3a: Secondary | ICD-10-CM | POA: Diagnosis not present

## 2022-12-11 DIAGNOSIS — E1165 Type 2 diabetes mellitus with hyperglycemia: Secondary | ICD-10-CM | POA: Diagnosis not present

## 2022-12-11 DIAGNOSIS — E1142 Type 2 diabetes mellitus with diabetic polyneuropathy: Secondary | ICD-10-CM | POA: Diagnosis not present

## 2022-12-11 DIAGNOSIS — E785 Hyperlipidemia, unspecified: Secondary | ICD-10-CM | POA: Diagnosis not present

## 2022-12-14 ENCOUNTER — Encounter: Payer: 59 | Admitting: Physical Medicine and Rehabilitation

## 2022-12-15 ENCOUNTER — Ambulatory Visit
Admission: RE | Admit: 2022-12-15 | Discharge: 2022-12-15 | Disposition: A | Discharge status: Home / Self Care | Payer: 59 | Source: Ambulatory Visit | Attending: Oncology | Admitting: Oncology

## 2022-12-15 ENCOUNTER — Inpatient Hospital Stay: Payer: 59 | Attending: Nurse Practitioner

## 2022-12-15 DIAGNOSIS — K8020 Calculus of gallbladder without cholecystitis without obstruction: Secondary | ICD-10-CM | POA: Diagnosis not present

## 2022-12-15 DIAGNOSIS — K769 Liver disease, unspecified: Secondary | ICD-10-CM | POA: Diagnosis not present

## 2022-12-15 DIAGNOSIS — C221 Intrahepatic bile duct carcinoma: Secondary | ICD-10-CM | POA: Diagnosis not present

## 2022-12-15 DIAGNOSIS — R918 Other nonspecific abnormal finding of lung field: Secondary | ICD-10-CM | POA: Diagnosis not present

## 2022-12-15 MED ORDER — IOHEXOL 300 MG/ML  SOLN
100.0000 mL | Freq: Once | INTRAMUSCULAR | Status: AC | PRN
  Administered 2022-12-15: 100 mL via INTRAVENOUS

## 2022-12-16 ENCOUNTER — Other Ambulatory Visit: Payer: Self-pay | Admitting: Family Medicine

## 2022-12-16 ENCOUNTER — Other Ambulatory Visit: Payer: Self-pay | Admitting: Oncology

## 2022-12-16 DIAGNOSIS — M5440 Lumbago with sciatica, unspecified side: Secondary | ICD-10-CM | POA: Diagnosis not present

## 2022-12-16 DIAGNOSIS — M5010 Cervical disc disorder with radiculopathy, unspecified cervical region: Secondary | ICD-10-CM | POA: Diagnosis not present

## 2022-12-16 DIAGNOSIS — C221 Intrahepatic bile duct carcinoma: Secondary | ICD-10-CM | POA: Diagnosis not present

## 2022-12-16 DIAGNOSIS — J4489 Other specified chronic obstructive pulmonary disease: Secondary | ICD-10-CM

## 2022-12-16 DIAGNOSIS — E1165 Type 2 diabetes mellitus with hyperglycemia: Secondary | ICD-10-CM | POA: Diagnosis not present

## 2022-12-16 DIAGNOSIS — K592 Neurogenic bowel, not elsewhere classified: Secondary | ICD-10-CM | POA: Diagnosis not present

## 2022-12-16 DIAGNOSIS — I700 Atherosclerosis of aorta: Secondary | ICD-10-CM | POA: Diagnosis not present

## 2022-12-16 DIAGNOSIS — S14122S Central cord syndrome at C2 level of cervical spinal cord, sequela: Secondary | ICD-10-CM | POA: Diagnosis not present

## 2022-12-16 DIAGNOSIS — M7542 Impingement syndrome of left shoulder: Secondary | ICD-10-CM | POA: Diagnosis not present

## 2022-12-16 DIAGNOSIS — E559 Vitamin D deficiency, unspecified: Secondary | ICD-10-CM | POA: Diagnosis not present

## 2022-12-16 DIAGNOSIS — I129 Hypertensive chronic kidney disease with stage 1 through stage 4 chronic kidney disease, or unspecified chronic kidney disease: Secondary | ICD-10-CM | POA: Diagnosis not present

## 2022-12-16 DIAGNOSIS — E1142 Type 2 diabetes mellitus with diabetic polyneuropathy: Secondary | ICD-10-CM | POA: Diagnosis not present

## 2022-12-16 DIAGNOSIS — E785 Hyperlipidemia, unspecified: Secondary | ICD-10-CM | POA: Diagnosis not present

## 2022-12-16 DIAGNOSIS — D696 Thrombocytopenia, unspecified: Secondary | ICD-10-CM | POA: Diagnosis not present

## 2022-12-16 DIAGNOSIS — N1831 Chronic kidney disease, stage 3a: Secondary | ICD-10-CM | POA: Diagnosis not present

## 2022-12-16 DIAGNOSIS — E1122 Type 2 diabetes mellitus with diabetic chronic kidney disease: Secondary | ICD-10-CM | POA: Diagnosis not present

## 2022-12-16 DIAGNOSIS — W19XXXS Unspecified fall, sequela: Secondary | ICD-10-CM | POA: Diagnosis not present

## 2022-12-16 DIAGNOSIS — G8252 Quadriplegia, C1-C4 incomplete: Secondary | ICD-10-CM | POA: Diagnosis not present

## 2022-12-16 DIAGNOSIS — M170 Bilateral primary osteoarthritis of knee: Secondary | ICD-10-CM | POA: Diagnosis not present

## 2022-12-16 DIAGNOSIS — M1030 Gout due to renal impairment, unspecified site: Secondary | ICD-10-CM | POA: Diagnosis not present

## 2022-12-16 DIAGNOSIS — S12191S Other nondisplaced fracture of second cervical vertebra, sequela: Secondary | ICD-10-CM | POA: Diagnosis not present

## 2022-12-16 DIAGNOSIS — D631 Anemia in chronic kidney disease: Secondary | ICD-10-CM | POA: Diagnosis not present

## 2022-12-16 DIAGNOSIS — E440 Moderate protein-calorie malnutrition: Secondary | ICD-10-CM | POA: Diagnosis not present

## 2022-12-16 DIAGNOSIS — G5602 Carpal tunnel syndrome, left upper limb: Secondary | ICD-10-CM | POA: Diagnosis not present

## 2022-12-17 ENCOUNTER — Inpatient Hospital Stay (HOSPITAL_BASED_OUTPATIENT_CLINIC_OR_DEPARTMENT_OTHER): Payer: 59 | Admitting: Hospice and Palliative Medicine

## 2022-12-17 DIAGNOSIS — W19XXXS Unspecified fall, sequela: Secondary | ICD-10-CM | POA: Diagnosis not present

## 2022-12-17 DIAGNOSIS — D696 Thrombocytopenia, unspecified: Secondary | ICD-10-CM | POA: Diagnosis not present

## 2022-12-17 DIAGNOSIS — M5010 Cervical disc disorder with radiculopathy, unspecified cervical region: Secondary | ICD-10-CM | POA: Diagnosis not present

## 2022-12-17 DIAGNOSIS — E1165 Type 2 diabetes mellitus with hyperglycemia: Secondary | ICD-10-CM | POA: Diagnosis not present

## 2022-12-17 DIAGNOSIS — M170 Bilateral primary osteoarthritis of knee: Secondary | ICD-10-CM | POA: Diagnosis not present

## 2022-12-17 DIAGNOSIS — M5440 Lumbago with sciatica, unspecified side: Secondary | ICD-10-CM | POA: Diagnosis not present

## 2022-12-17 DIAGNOSIS — G8252 Quadriplegia, C1-C4 incomplete: Secondary | ICD-10-CM | POA: Diagnosis not present

## 2022-12-17 DIAGNOSIS — E785 Hyperlipidemia, unspecified: Secondary | ICD-10-CM | POA: Diagnosis not present

## 2022-12-17 DIAGNOSIS — E440 Moderate protein-calorie malnutrition: Secondary | ICD-10-CM | POA: Diagnosis not present

## 2022-12-17 DIAGNOSIS — S14122S Central cord syndrome at C2 level of cervical spinal cord, sequela: Secondary | ICD-10-CM | POA: Diagnosis not present

## 2022-12-17 DIAGNOSIS — D631 Anemia in chronic kidney disease: Secondary | ICD-10-CM | POA: Diagnosis not present

## 2022-12-17 DIAGNOSIS — J4489 Other specified chronic obstructive pulmonary disease: Secondary | ICD-10-CM | POA: Diagnosis not present

## 2022-12-17 DIAGNOSIS — I700 Atherosclerosis of aorta: Secondary | ICD-10-CM | POA: Diagnosis not present

## 2022-12-17 DIAGNOSIS — M1030 Gout due to renal impairment, unspecified site: Secondary | ICD-10-CM | POA: Diagnosis not present

## 2022-12-17 DIAGNOSIS — G5602 Carpal tunnel syndrome, left upper limb: Secondary | ICD-10-CM | POA: Diagnosis not present

## 2022-12-17 DIAGNOSIS — C221 Intrahepatic bile duct carcinoma: Secondary | ICD-10-CM | POA: Diagnosis not present

## 2022-12-17 DIAGNOSIS — E1142 Type 2 diabetes mellitus with diabetic polyneuropathy: Secondary | ICD-10-CM | POA: Diagnosis not present

## 2022-12-17 DIAGNOSIS — K592 Neurogenic bowel, not elsewhere classified: Secondary | ICD-10-CM | POA: Diagnosis not present

## 2022-12-17 DIAGNOSIS — M7542 Impingement syndrome of left shoulder: Secondary | ICD-10-CM | POA: Diagnosis not present

## 2022-12-17 DIAGNOSIS — S12191S Other nondisplaced fracture of second cervical vertebra, sequela: Secondary | ICD-10-CM | POA: Diagnosis not present

## 2022-12-17 DIAGNOSIS — E1122 Type 2 diabetes mellitus with diabetic chronic kidney disease: Secondary | ICD-10-CM | POA: Diagnosis not present

## 2022-12-17 DIAGNOSIS — I129 Hypertensive chronic kidney disease with stage 1 through stage 4 chronic kidney disease, or unspecified chronic kidney disease: Secondary | ICD-10-CM | POA: Diagnosis not present

## 2022-12-17 DIAGNOSIS — N1831 Chronic kidney disease, stage 3a: Secondary | ICD-10-CM | POA: Diagnosis not present

## 2022-12-17 DIAGNOSIS — E559 Vitamin D deficiency, unspecified: Secondary | ICD-10-CM | POA: Diagnosis not present

## 2022-12-17 NOTE — Progress Notes (Signed)
Unable to reach patient.  Will reschedule

## 2022-12-18 DIAGNOSIS — E1142 Type 2 diabetes mellitus with diabetic polyneuropathy: Secondary | ICD-10-CM | POA: Diagnosis not present

## 2022-12-18 DIAGNOSIS — M170 Bilateral primary osteoarthritis of knee: Secondary | ICD-10-CM | POA: Diagnosis not present

## 2022-12-18 DIAGNOSIS — I700 Atherosclerosis of aorta: Secondary | ICD-10-CM | POA: Diagnosis not present

## 2022-12-18 DIAGNOSIS — W19XXXS Unspecified fall, sequela: Secondary | ICD-10-CM | POA: Diagnosis not present

## 2022-12-18 DIAGNOSIS — J4489 Other specified chronic obstructive pulmonary disease: Secondary | ICD-10-CM | POA: Diagnosis not present

## 2022-12-18 DIAGNOSIS — M7542 Impingement syndrome of left shoulder: Secondary | ICD-10-CM | POA: Diagnosis not present

## 2022-12-18 DIAGNOSIS — C221 Intrahepatic bile duct carcinoma: Secondary | ICD-10-CM | POA: Diagnosis not present

## 2022-12-18 DIAGNOSIS — S14122S Central cord syndrome at C2 level of cervical spinal cord, sequela: Secondary | ICD-10-CM | POA: Diagnosis not present

## 2022-12-18 DIAGNOSIS — E559 Vitamin D deficiency, unspecified: Secondary | ICD-10-CM | POA: Diagnosis not present

## 2022-12-18 DIAGNOSIS — S12191S Other nondisplaced fracture of second cervical vertebra, sequela: Secondary | ICD-10-CM | POA: Diagnosis not present

## 2022-12-18 DIAGNOSIS — M5440 Lumbago with sciatica, unspecified side: Secondary | ICD-10-CM | POA: Diagnosis not present

## 2022-12-18 DIAGNOSIS — I129 Hypertensive chronic kidney disease with stage 1 through stage 4 chronic kidney disease, or unspecified chronic kidney disease: Secondary | ICD-10-CM | POA: Diagnosis not present

## 2022-12-18 DIAGNOSIS — E440 Moderate protein-calorie malnutrition: Secondary | ICD-10-CM | POA: Diagnosis not present

## 2022-12-18 DIAGNOSIS — N1831 Chronic kidney disease, stage 3a: Secondary | ICD-10-CM | POA: Diagnosis not present

## 2022-12-18 DIAGNOSIS — K592 Neurogenic bowel, not elsewhere classified: Secondary | ICD-10-CM | POA: Diagnosis not present

## 2022-12-18 DIAGNOSIS — E1122 Type 2 diabetes mellitus with diabetic chronic kidney disease: Secondary | ICD-10-CM | POA: Diagnosis not present

## 2022-12-18 DIAGNOSIS — D696 Thrombocytopenia, unspecified: Secondary | ICD-10-CM | POA: Diagnosis not present

## 2022-12-18 DIAGNOSIS — E785 Hyperlipidemia, unspecified: Secondary | ICD-10-CM | POA: Diagnosis not present

## 2022-12-18 DIAGNOSIS — D631 Anemia in chronic kidney disease: Secondary | ICD-10-CM | POA: Diagnosis not present

## 2022-12-18 DIAGNOSIS — M5010 Cervical disc disorder with radiculopathy, unspecified cervical region: Secondary | ICD-10-CM | POA: Diagnosis not present

## 2022-12-18 DIAGNOSIS — G8252 Quadriplegia, C1-C4 incomplete: Secondary | ICD-10-CM | POA: Diagnosis not present

## 2022-12-18 DIAGNOSIS — M1030 Gout due to renal impairment, unspecified site: Secondary | ICD-10-CM | POA: Diagnosis not present

## 2022-12-18 DIAGNOSIS — G5602 Carpal tunnel syndrome, left upper limb: Secondary | ICD-10-CM | POA: Diagnosis not present

## 2022-12-18 DIAGNOSIS — E1165 Type 2 diabetes mellitus with hyperglycemia: Secondary | ICD-10-CM | POA: Diagnosis not present

## 2022-12-23 ENCOUNTER — Other Ambulatory Visit: Payer: Self-pay

## 2022-12-23 DIAGNOSIS — J4489 Other specified chronic obstructive pulmonary disease: Secondary | ICD-10-CM | POA: Diagnosis not present

## 2022-12-23 DIAGNOSIS — G5602 Carpal tunnel syndrome, left upper limb: Secondary | ICD-10-CM | POA: Diagnosis not present

## 2022-12-23 DIAGNOSIS — M5440 Lumbago with sciatica, unspecified side: Secondary | ICD-10-CM | POA: Diagnosis not present

## 2022-12-23 DIAGNOSIS — K592 Neurogenic bowel, not elsewhere classified: Secondary | ICD-10-CM | POA: Diagnosis not present

## 2022-12-23 DIAGNOSIS — D631 Anemia in chronic kidney disease: Secondary | ICD-10-CM | POA: Diagnosis not present

## 2022-12-23 DIAGNOSIS — M5010 Cervical disc disorder with radiculopathy, unspecified cervical region: Secondary | ICD-10-CM | POA: Diagnosis not present

## 2022-12-23 DIAGNOSIS — W19XXXS Unspecified fall, sequela: Secondary | ICD-10-CM | POA: Diagnosis not present

## 2022-12-23 DIAGNOSIS — E1122 Type 2 diabetes mellitus with diabetic chronic kidney disease: Secondary | ICD-10-CM | POA: Diagnosis not present

## 2022-12-23 DIAGNOSIS — M7542 Impingement syndrome of left shoulder: Secondary | ICD-10-CM | POA: Diagnosis not present

## 2022-12-23 DIAGNOSIS — N1831 Chronic kidney disease, stage 3a: Secondary | ICD-10-CM | POA: Diagnosis not present

## 2022-12-23 DIAGNOSIS — M170 Bilateral primary osteoarthritis of knee: Secondary | ICD-10-CM | POA: Diagnosis not present

## 2022-12-23 DIAGNOSIS — Z981 Arthrodesis status: Secondary | ICD-10-CM

## 2022-12-23 DIAGNOSIS — S12191S Other nondisplaced fracture of second cervical vertebra, sequela: Secondary | ICD-10-CM | POA: Diagnosis not present

## 2022-12-23 DIAGNOSIS — E559 Vitamin D deficiency, unspecified: Secondary | ICD-10-CM | POA: Diagnosis not present

## 2022-12-23 DIAGNOSIS — I129 Hypertensive chronic kidney disease with stage 1 through stage 4 chronic kidney disease, or unspecified chronic kidney disease: Secondary | ICD-10-CM | POA: Diagnosis not present

## 2022-12-23 DIAGNOSIS — D696 Thrombocytopenia, unspecified: Secondary | ICD-10-CM | POA: Diagnosis not present

## 2022-12-23 DIAGNOSIS — C221 Intrahepatic bile duct carcinoma: Secondary | ICD-10-CM | POA: Diagnosis not present

## 2022-12-23 DIAGNOSIS — G8252 Quadriplegia, C1-C4 incomplete: Secondary | ICD-10-CM | POA: Diagnosis not present

## 2022-12-23 DIAGNOSIS — E785 Hyperlipidemia, unspecified: Secondary | ICD-10-CM | POA: Diagnosis not present

## 2022-12-23 DIAGNOSIS — S14122S Central cord syndrome at C2 level of cervical spinal cord, sequela: Secondary | ICD-10-CM | POA: Diagnosis not present

## 2022-12-23 DIAGNOSIS — E440 Moderate protein-calorie malnutrition: Secondary | ICD-10-CM | POA: Diagnosis not present

## 2022-12-23 DIAGNOSIS — E1165 Type 2 diabetes mellitus with hyperglycemia: Secondary | ICD-10-CM | POA: Diagnosis not present

## 2022-12-23 DIAGNOSIS — I700 Atherosclerosis of aorta: Secondary | ICD-10-CM | POA: Diagnosis not present

## 2022-12-23 DIAGNOSIS — M1030 Gout due to renal impairment, unspecified site: Secondary | ICD-10-CM | POA: Diagnosis not present

## 2022-12-23 DIAGNOSIS — E1142 Type 2 diabetes mellitus with diabetic polyneuropathy: Secondary | ICD-10-CM | POA: Diagnosis not present

## 2022-12-24 ENCOUNTER — Ambulatory Visit
Admission: RE | Admit: 2022-12-24 | Discharge: 2022-12-24 | Disposition: A | Discharge status: Home / Self Care | Payer: 59 | Source: Ambulatory Visit | Attending: Neurosurgery | Admitting: Neurosurgery

## 2022-12-24 ENCOUNTER — Encounter: Payer: Self-pay | Admitting: Oncology

## 2022-12-24 ENCOUNTER — Telehealth: Payer: Self-pay

## 2022-12-24 ENCOUNTER — Ambulatory Visit (INDEPENDENT_AMBULATORY_CARE_PROVIDER_SITE_OTHER): Payer: 59 | Admitting: Neurosurgery

## 2022-12-24 ENCOUNTER — Encounter: Payer: Self-pay | Admitting: Neurosurgery

## 2022-12-24 VITALS — Temp 97.6°F | Wt 155.4 lb

## 2022-12-24 DIAGNOSIS — S12101A Unspecified nondisplaced fracture of second cervical vertebra, initial encounter for closed fracture: Secondary | ICD-10-CM | POA: Diagnosis not present

## 2022-12-24 DIAGNOSIS — S12112A Nondisplaced Type II dens fracture, initial encounter for closed fracture: Secondary | ICD-10-CM | POA: Diagnosis not present

## 2022-12-24 DIAGNOSIS — Z981 Arthrodesis status: Secondary | ICD-10-CM

## 2022-12-24 DIAGNOSIS — S14129D Central cord syndrome at unspecified level of cervical spinal cord, subsequent encounter: Secondary | ICD-10-CM

## 2022-12-24 DIAGNOSIS — S14109D Unspecified injury at unspecified level of cervical spinal cord, subsequent encounter: Secondary | ICD-10-CM

## 2022-12-24 DIAGNOSIS — M532X2 Spinal instabilities, cervical region: Secondary | ICD-10-CM

## 2022-12-24 DIAGNOSIS — Z09 Encounter for follow-up examination after completed treatment for conditions other than malignant neoplasm: Secondary | ICD-10-CM

## 2022-12-24 DIAGNOSIS — M47812 Spondylosis without myelopathy or radiculopathy, cervical region: Secondary | ICD-10-CM | POA: Diagnosis not present

## 2022-12-24 DIAGNOSIS — Z452 Encounter for adjustment and management of vascular access device: Secondary | ICD-10-CM | POA: Diagnosis not present

## 2022-12-24 NOTE — Telephone Encounter (Signed)
-----  Message from Sharlot Gowda, RN sent at 12/24/2022  1:19 PM EST ----- Regarding: xrays Per Dr Myer Haff, her xrays look good and she is released from her collar

## 2022-12-24 NOTE — Progress Notes (Signed)
   REFERRING PHYSICIAN:  Alba Cory, Md [address] [place],  [place] [address]  DOS: 10/02/22 - C4-5 ACDF for central cord syndrome s/p fall.   HISTORY OF PRESENT ILLNESS: Janice Short is status post ACDF C4-C5. She went to rehab after leaving the hospital.   She no longer has arm pain.  She is walking but still weak.  PHYSICAL EXAMINATION:  NEUROLOGICAL:  General: In no acute distress.   Awake, alert, oriented to person, place, and time.  Pupils equal round and reactive to light.  Facial tone is symmetric.    Strength: Side Biceps Triceps Deltoid Interossei Grip Wrist Ext. Wrist Flex.  R 4+ 4+ 4+ 4+ 4+ 4+ 4+  L 4+ 4 4 4- 4 4- 4-   Incision well healed   Imaging:  Cervical xrays  Stable   Assessment / Plan: IOMA CHISMAR is doing well s/p above surgery.   She will follow-up in 6 months with x-rays.  She is cleared from utilizing her collar.  She will continue to see the oncology clinic for her cancer treatment.  She is released from restrictions.  Dept of Neurosurgery

## 2022-12-24 NOTE — Progress Notes (Deleted)
   REFERRING PHYSICIAN:  Steele Sizer, [address] [place],  [place] [address]  DOS: 10/02/22 - C4-5 ACDF for central cord syndrome s/p fall.   HISTORY OF PRESENT ILLNESS: Janice Short is status post ACDF C4-C5. She went to rehab after leaving the hospital. She was discharged back home on 10/28/22.   She continues with neck pain that radiates into both arms. She has been taking oxycodone, neurontin, and tylenol. She is not taking robaxin. She takes neurontin 600mg  bid for her DM. She feels like her upper extremity strength is improving. Her left side was more affected than the right. She is right hand dominant.     PHYSICAL EXAMINATION:  NEUROLOGICAL:  General: In no acute distress.   Awake, alert, oriented to person, place, and time.  Pupils equal round and reactive to light.  Facial tone is symmetric.    Strength: Side Biceps Triceps Deltoid Interossei Grip Wrist Ext. Wrist Flex.  R +4 +4 +4 +4 +4 +4 +4  L 4 4- 3 3 4  4- 4-   Incision well healed   Imaging:  Cervical xrays dated 11/17/22:  ACDF C4-C5 with hardware intact.   Xray report not available. Xrays reviewed with Dr. Izora Ribas.    Assessment / Plan: Janice Short is doing well s/p above surgery. Still with mild pain, but her strength is improving. Treatment options reviewed with patient and following plan made:   - She is not doing any lifting, but we discussed that she has a 25 pound weight restriction.  - Continue current medications including prn oxycodone and robaxin. She is working on weaning from oxycodone. Will hold on further refills.  - She has been on neurontin 600mg  bid for DM. Will increase to 600mg  tid. Reviewed dosing and side effects.  - Okay to wean out of collar per Dr. Izora Ribas.  - Follow up as scheduled in 4-5 weeks and prn.   Advised to contact the office if any questions or concerns arise.   ADDENDUM 11/18/22:  Reviewed cervical xray report from xrays done on 11/17/22 with Dr.  Izora Ribas regarding C2. This is stable. No change to above plan.   Geronimo Boot PA-C Dept of Neurosurgery

## 2022-12-24 NOTE — Telephone Encounter (Signed)
I notified Janice Short that Dr Myer Haff said she is released from her collar. He would like to see her back in 6 months with xrays. I offered to make this appointment while on the phone, but she said she didn't want to schedule the appointment at this time because she didn't know the plan for her cancer treatment. She would like [place] to call her back at a later time to schedule the appointment.

## 2022-12-25 DIAGNOSIS — E785 Hyperlipidemia, unspecified: Secondary | ICD-10-CM | POA: Diagnosis not present

## 2022-12-25 DIAGNOSIS — N1831 Chronic kidney disease, stage 3a: Secondary | ICD-10-CM | POA: Diagnosis not present

## 2022-12-25 DIAGNOSIS — E1122 Type 2 diabetes mellitus with diabetic chronic kidney disease: Secondary | ICD-10-CM | POA: Diagnosis not present

## 2022-12-25 DIAGNOSIS — G5602 Carpal tunnel syndrome, left upper limb: Secondary | ICD-10-CM | POA: Diagnosis not present

## 2022-12-25 DIAGNOSIS — E559 Vitamin D deficiency, unspecified: Secondary | ICD-10-CM | POA: Diagnosis not present

## 2022-12-25 DIAGNOSIS — M5010 Cervical disc disorder with radiculopathy, unspecified cervical region: Secondary | ICD-10-CM | POA: Diagnosis not present

## 2022-12-25 DIAGNOSIS — C221 Intrahepatic bile duct carcinoma: Secondary | ICD-10-CM | POA: Diagnosis not present

## 2022-12-25 DIAGNOSIS — I129 Hypertensive chronic kidney disease with stage 1 through stage 4 chronic kidney disease, or unspecified chronic kidney disease: Secondary | ICD-10-CM | POA: Diagnosis not present

## 2022-12-25 DIAGNOSIS — D696 Thrombocytopenia, unspecified: Secondary | ICD-10-CM | POA: Diagnosis not present

## 2022-12-25 DIAGNOSIS — E1165 Type 2 diabetes mellitus with hyperglycemia: Secondary | ICD-10-CM | POA: Diagnosis not present

## 2022-12-25 DIAGNOSIS — M170 Bilateral primary osteoarthritis of knee: Secondary | ICD-10-CM | POA: Diagnosis not present

## 2022-12-25 DIAGNOSIS — M5440 Lumbago with sciatica, unspecified side: Secondary | ICD-10-CM | POA: Diagnosis not present

## 2022-12-25 DIAGNOSIS — J4489 Other specified chronic obstructive pulmonary disease: Secondary | ICD-10-CM | POA: Diagnosis not present

## 2022-12-25 DIAGNOSIS — M1030 Gout due to renal impairment, unspecified site: Secondary | ICD-10-CM | POA: Diagnosis not present

## 2022-12-25 DIAGNOSIS — D631 Anemia in chronic kidney disease: Secondary | ICD-10-CM | POA: Diagnosis not present

## 2022-12-25 DIAGNOSIS — S14122S Central cord syndrome at C2 level of cervical spinal cord, sequela: Secondary | ICD-10-CM | POA: Diagnosis not present

## 2022-12-25 DIAGNOSIS — M7542 Impingement syndrome of left shoulder: Secondary | ICD-10-CM | POA: Diagnosis not present

## 2022-12-25 DIAGNOSIS — E440 Moderate protein-calorie malnutrition: Secondary | ICD-10-CM | POA: Diagnosis not present

## 2022-12-25 DIAGNOSIS — I700 Atherosclerosis of aorta: Secondary | ICD-10-CM | POA: Diagnosis not present

## 2022-12-25 DIAGNOSIS — G8252 Quadriplegia, C1-C4 incomplete: Secondary | ICD-10-CM | POA: Diagnosis not present

## 2022-12-25 DIAGNOSIS — K592 Neurogenic bowel, not elsewhere classified: Secondary | ICD-10-CM | POA: Diagnosis not present

## 2022-12-25 DIAGNOSIS — E1142 Type 2 diabetes mellitus with diabetic polyneuropathy: Secondary | ICD-10-CM | POA: Diagnosis not present

## 2022-12-25 DIAGNOSIS — S12191S Other nondisplaced fracture of second cervical vertebra, sequela: Secondary | ICD-10-CM | POA: Diagnosis not present

## 2022-12-25 DIAGNOSIS — W19XXXS Unspecified fall, sequela: Secondary | ICD-10-CM | POA: Diagnosis not present

## 2022-12-30 DIAGNOSIS — M5440 Lumbago with sciatica, unspecified side: Secondary | ICD-10-CM | POA: Diagnosis not present

## 2022-12-30 DIAGNOSIS — E1122 Type 2 diabetes mellitus with diabetic chronic kidney disease: Secondary | ICD-10-CM | POA: Diagnosis not present

## 2022-12-30 DIAGNOSIS — M1030 Gout due to renal impairment, unspecified site: Secondary | ICD-10-CM | POA: Diagnosis not present

## 2022-12-30 DIAGNOSIS — E1142 Type 2 diabetes mellitus with diabetic polyneuropathy: Secondary | ICD-10-CM | POA: Diagnosis not present

## 2022-12-30 DIAGNOSIS — E1165 Type 2 diabetes mellitus with hyperglycemia: Secondary | ICD-10-CM | POA: Diagnosis not present

## 2022-12-30 DIAGNOSIS — D696 Thrombocytopenia, unspecified: Secondary | ICD-10-CM | POA: Diagnosis not present

## 2022-12-30 DIAGNOSIS — J4489 Other specified chronic obstructive pulmonary disease: Secondary | ICD-10-CM | POA: Diagnosis not present

## 2022-12-30 DIAGNOSIS — D631 Anemia in chronic kidney disease: Secondary | ICD-10-CM | POA: Diagnosis not present

## 2022-12-30 DIAGNOSIS — M7542 Impingement syndrome of left shoulder: Secondary | ICD-10-CM | POA: Diagnosis not present

## 2022-12-30 DIAGNOSIS — E559 Vitamin D deficiency, unspecified: Secondary | ICD-10-CM | POA: Diagnosis not present

## 2022-12-30 DIAGNOSIS — N1831 Chronic kidney disease, stage 3a: Secondary | ICD-10-CM | POA: Diagnosis not present

## 2022-12-30 DIAGNOSIS — C221 Intrahepatic bile duct carcinoma: Secondary | ICD-10-CM | POA: Diagnosis not present

## 2022-12-30 DIAGNOSIS — S12191S Other nondisplaced fracture of second cervical vertebra, sequela: Secondary | ICD-10-CM | POA: Diagnosis not present

## 2022-12-30 DIAGNOSIS — K592 Neurogenic bowel, not elsewhere classified: Secondary | ICD-10-CM | POA: Diagnosis not present

## 2022-12-30 DIAGNOSIS — M170 Bilateral primary osteoarthritis of knee: Secondary | ICD-10-CM | POA: Diagnosis not present

## 2022-12-30 DIAGNOSIS — W19XXXS Unspecified fall, sequela: Secondary | ICD-10-CM | POA: Diagnosis not present

## 2022-12-30 DIAGNOSIS — I700 Atherosclerosis of aorta: Secondary | ICD-10-CM | POA: Diagnosis not present

## 2022-12-30 DIAGNOSIS — E785 Hyperlipidemia, unspecified: Secondary | ICD-10-CM | POA: Diagnosis not present

## 2022-12-30 DIAGNOSIS — G8252 Quadriplegia, C1-C4 incomplete: Secondary | ICD-10-CM | POA: Diagnosis not present

## 2022-12-30 DIAGNOSIS — E440 Moderate protein-calorie malnutrition: Secondary | ICD-10-CM | POA: Diagnosis not present

## 2022-12-30 DIAGNOSIS — G5602 Carpal tunnel syndrome, left upper limb: Secondary | ICD-10-CM | POA: Diagnosis not present

## 2022-12-30 DIAGNOSIS — M5010 Cervical disc disorder with radiculopathy, unspecified cervical region: Secondary | ICD-10-CM | POA: Diagnosis not present

## 2022-12-30 DIAGNOSIS — S14122S Central cord syndrome at C2 level of cervical spinal cord, sequela: Secondary | ICD-10-CM | POA: Diagnosis not present

## 2022-12-30 DIAGNOSIS — I129 Hypertensive chronic kidney disease with stage 1 through stage 4 chronic kidney disease, or unspecified chronic kidney disease: Secondary | ICD-10-CM | POA: Diagnosis not present

## 2023-01-01 DIAGNOSIS — I129 Hypertensive chronic kidney disease with stage 1 through stage 4 chronic kidney disease, or unspecified chronic kidney disease: Secondary | ICD-10-CM | POA: Diagnosis not present

## 2023-01-01 DIAGNOSIS — G8252 Quadriplegia, C1-C4 incomplete: Secondary | ICD-10-CM | POA: Diagnosis not present

## 2023-01-01 DIAGNOSIS — K592 Neurogenic bowel, not elsewhere classified: Secondary | ICD-10-CM | POA: Diagnosis not present

## 2023-01-01 DIAGNOSIS — M5010 Cervical disc disorder with radiculopathy, unspecified cervical region: Secondary | ICD-10-CM | POA: Diagnosis not present

## 2023-01-01 DIAGNOSIS — D631 Anemia in chronic kidney disease: Secondary | ICD-10-CM | POA: Diagnosis not present

## 2023-01-01 DIAGNOSIS — C221 Intrahepatic bile duct carcinoma: Secondary | ICD-10-CM | POA: Diagnosis not present

## 2023-01-01 DIAGNOSIS — G5602 Carpal tunnel syndrome, left upper limb: Secondary | ICD-10-CM | POA: Diagnosis not present

## 2023-01-01 DIAGNOSIS — I700 Atherosclerosis of aorta: Secondary | ICD-10-CM | POA: Diagnosis not present

## 2023-01-01 DIAGNOSIS — M7542 Impingement syndrome of left shoulder: Secondary | ICD-10-CM | POA: Diagnosis not present

## 2023-01-01 DIAGNOSIS — S12191S Other nondisplaced fracture of second cervical vertebra, sequela: Secondary | ICD-10-CM | POA: Diagnosis not present

## 2023-01-01 DIAGNOSIS — D696 Thrombocytopenia, unspecified: Secondary | ICD-10-CM | POA: Diagnosis not present

## 2023-01-01 DIAGNOSIS — E1142 Type 2 diabetes mellitus with diabetic polyneuropathy: Secondary | ICD-10-CM | POA: Diagnosis not present

## 2023-01-01 DIAGNOSIS — E559 Vitamin D deficiency, unspecified: Secondary | ICD-10-CM | POA: Diagnosis not present

## 2023-01-01 DIAGNOSIS — W19XXXS Unspecified fall, sequela: Secondary | ICD-10-CM | POA: Diagnosis not present

## 2023-01-01 DIAGNOSIS — E785 Hyperlipidemia, unspecified: Secondary | ICD-10-CM | POA: Diagnosis not present

## 2023-01-01 DIAGNOSIS — S14122S Central cord syndrome at C2 level of cervical spinal cord, sequela: Secondary | ICD-10-CM | POA: Diagnosis not present

## 2023-01-01 DIAGNOSIS — J4489 Other specified chronic obstructive pulmonary disease: Secondary | ICD-10-CM | POA: Diagnosis not present

## 2023-01-01 DIAGNOSIS — E440 Moderate protein-calorie malnutrition: Secondary | ICD-10-CM | POA: Diagnosis not present

## 2023-01-01 DIAGNOSIS — N1831 Chronic kidney disease, stage 3a: Secondary | ICD-10-CM | POA: Diagnosis not present

## 2023-01-01 DIAGNOSIS — E1122 Type 2 diabetes mellitus with diabetic chronic kidney disease: Secondary | ICD-10-CM | POA: Diagnosis not present

## 2023-01-01 DIAGNOSIS — E1165 Type 2 diabetes mellitus with hyperglycemia: Secondary | ICD-10-CM | POA: Diagnosis not present

## 2023-01-01 DIAGNOSIS — M1030 Gout due to renal impairment, unspecified site: Secondary | ICD-10-CM | POA: Diagnosis not present

## 2023-01-01 DIAGNOSIS — M5440 Lumbago with sciatica, unspecified side: Secondary | ICD-10-CM | POA: Diagnosis not present

## 2023-01-01 DIAGNOSIS — M170 Bilateral primary osteoarthritis of knee: Secondary | ICD-10-CM | POA: Diagnosis not present

## 2023-01-01 NOTE — Telephone Encounter (Signed)
She said to call her next week.

## 2023-01-04 ENCOUNTER — Telehealth: Payer: Self-pay | Admitting: Family Medicine

## 2023-01-04 ENCOUNTER — Other Ambulatory Visit: Payer: Self-pay | Admitting: Family Medicine

## 2023-01-04 DIAGNOSIS — J4489 Other specified chronic obstructive pulmonary disease: Secondary | ICD-10-CM

## 2023-01-04 NOTE — Telephone Encounter (Signed)
Home Health Verbal Orders - Caller/Agency: Chris/ [facility]  Callback Number: [phone]/ vm can be left  Requesting PT and home health aid  Frequency: 1 X a week for 9 weeks

## 2023-01-04 NOTE — Telephone Encounter (Signed)
Medication Refill - Medication: albuterol (VENTOLIN HFA) 108 (90 Base) MCG/ACT inhaler [[id number]]   [facility] is calling for the request. Unable to see if the 12/06/22 Request was recevied  Has the patient contacted their pharmacy? Yes.   (Agent: If no, request that the patient contact the pharmacy for the refill. If patient does not wish to contact the pharmacy document the reason why and proceed with request.) (Agent: If yes, when and what did the pharmacy advise?)  Preferred Pharmacy (with phone number or street name): [facility] - [place], [address] [place], [place] [address] Phone: [phone]  Fax: [phone]  Has the patient been seen for an appointment in the last year OR does the patient have an upcoming appointment? Yes.    Agent: Please be advised that RX refills may take up to 3 business days. We ask that you follow-up with your pharmacy.

## 2023-01-05 DIAGNOSIS — M1030 Gout due to renal impairment, unspecified site: Secondary | ICD-10-CM | POA: Diagnosis not present

## 2023-01-05 DIAGNOSIS — K592 Neurogenic bowel, not elsewhere classified: Secondary | ICD-10-CM | POA: Diagnosis not present

## 2023-01-05 DIAGNOSIS — E1165 Type 2 diabetes mellitus with hyperglycemia: Secondary | ICD-10-CM | POA: Diagnosis not present

## 2023-01-05 DIAGNOSIS — I700 Atherosclerosis of aorta: Secondary | ICD-10-CM | POA: Diagnosis not present

## 2023-01-05 DIAGNOSIS — E785 Hyperlipidemia, unspecified: Secondary | ICD-10-CM | POA: Diagnosis not present

## 2023-01-05 DIAGNOSIS — S12191S Other nondisplaced fracture of second cervical vertebra, sequela: Secondary | ICD-10-CM | POA: Diagnosis not present

## 2023-01-05 DIAGNOSIS — I471 Supraventricular tachycardia, unspecified: Secondary | ICD-10-CM | POA: Diagnosis not present

## 2023-01-05 DIAGNOSIS — S14122S Central cord syndrome at C2 level of cervical spinal cord, sequela: Secondary | ICD-10-CM | POA: Diagnosis not present

## 2023-01-05 DIAGNOSIS — M4802 Spinal stenosis, cervical region: Secondary | ICD-10-CM | POA: Diagnosis not present

## 2023-01-05 DIAGNOSIS — C221 Intrahepatic bile duct carcinoma: Secondary | ICD-10-CM | POA: Diagnosis not present

## 2023-01-05 DIAGNOSIS — N319 Neuromuscular dysfunction of bladder, unspecified: Secondary | ICD-10-CM | POA: Diagnosis not present

## 2023-01-05 DIAGNOSIS — N1831 Chronic kidney disease, stage 3a: Secondary | ICD-10-CM | POA: Diagnosis not present

## 2023-01-05 DIAGNOSIS — M5010 Cervical disc disorder with radiculopathy, unspecified cervical region: Secondary | ICD-10-CM | POA: Diagnosis not present

## 2023-01-05 DIAGNOSIS — W19XXXS Unspecified fall, sequela: Secondary | ICD-10-CM | POA: Diagnosis not present

## 2023-01-05 DIAGNOSIS — J4489 Other specified chronic obstructive pulmonary disease: Secondary | ICD-10-CM | POA: Diagnosis not present

## 2023-01-05 DIAGNOSIS — L89321 Pressure ulcer of left buttock, stage 1: Secondary | ICD-10-CM | POA: Diagnosis not present

## 2023-01-05 DIAGNOSIS — E1142 Type 2 diabetes mellitus with diabetic polyneuropathy: Secondary | ICD-10-CM | POA: Diagnosis not present

## 2023-01-05 DIAGNOSIS — D696 Thrombocytopenia, unspecified: Secondary | ICD-10-CM | POA: Diagnosis not present

## 2023-01-05 DIAGNOSIS — E559 Vitamin D deficiency, unspecified: Secondary | ICD-10-CM | POA: Diagnosis not present

## 2023-01-05 DIAGNOSIS — D631 Anemia in chronic kidney disease: Secondary | ICD-10-CM | POA: Diagnosis not present

## 2023-01-05 DIAGNOSIS — G5602 Carpal tunnel syndrome, left upper limb: Secondary | ICD-10-CM | POA: Diagnosis not present

## 2023-01-05 DIAGNOSIS — I129 Hypertensive chronic kidney disease with stage 1 through stage 4 chronic kidney disease, or unspecified chronic kidney disease: Secondary | ICD-10-CM | POA: Diagnosis not present

## 2023-01-05 DIAGNOSIS — E1122 Type 2 diabetes mellitus with diabetic chronic kidney disease: Secondary | ICD-10-CM | POA: Diagnosis not present

## 2023-01-05 DIAGNOSIS — M7542 Impingement syndrome of left shoulder: Secondary | ICD-10-CM | POA: Diagnosis not present

## 2023-01-05 DIAGNOSIS — G8252 Quadriplegia, C1-C4 incomplete: Secondary | ICD-10-CM | POA: Diagnosis not present

## 2023-01-05 MED ORDER — ALBUTEROL SULFATE HFA 108 (90 BASE) MCG/ACT IN AERS: INHALATION_SPRAY | RESPIRATORY_TRACT | 0 refills | Status: AC

## 2023-01-05 NOTE — Telephone Encounter (Signed)
Requested Prescriptions  Pending Prescriptions Disp Refills   albuterol (VENTOLIN HFA) 108 (90 Base) MCG/ACT inhaler 6.7 g 0    Sig: INHALE TWO PUFFS BY MOUTH EVERY 6 HOURS AS NEEDED FOR SHORTNESS OF BREATH OR WHEEZING     Pulmonology:  Beta Agonists 2 Passed - 01/04/2023  5:52 PM      Passed - Last BP in normal range    BP Readings from Last 1 Encounters:  11/17/22 118/68         Passed - Last Heart Rate in normal range    Pulse Readings from Last 1 Encounters:  11/10/22 78         Passed - Valid encounter within last 12 months    Recent Outpatient Visits           2 months ago Hospital discharge follow-up   [facility] Steele Sizer, MD   3 months ago Diabetic polyneuropathy associated with type 2 diabetes mellitus [facility])   [facility] Steele Sizer, MD   1 year ago Diabetes mellitus type 2 with complications [facility])   [facility] Steele Sizer, MD   1 year ago Chronic left shoulder pain   [facility] Teodora Medici, DO   1 year ago Diabetes mellitus type 2 with complications [facility])   [facility] Steele Sizer, MD       Future Appointments             In 2 weeks Ancil Boozer, Drue Stager, MD [facility], [place]   In 1 month  [place]   In 1 month Lovorn, Jinny Blossom, MD [place], CPR

## 2023-01-06 ENCOUNTER — Other Ambulatory Visit: Payer: Self-pay | Admitting: Family Medicine

## 2023-01-06 DIAGNOSIS — N1831 Chronic kidney disease, stage 3a: Secondary | ICD-10-CM | POA: Diagnosis not present

## 2023-01-06 DIAGNOSIS — L89321 Pressure ulcer of left buttock, stage 1: Secondary | ICD-10-CM | POA: Diagnosis not present

## 2023-01-06 DIAGNOSIS — S12191S Other nondisplaced fracture of second cervical vertebra, sequela: Secondary | ICD-10-CM | POA: Diagnosis not present

## 2023-01-06 DIAGNOSIS — I700 Atherosclerosis of aorta: Secondary | ICD-10-CM | POA: Diagnosis not present

## 2023-01-06 DIAGNOSIS — M1030 Gout due to renal impairment, unspecified site: Secondary | ICD-10-CM | POA: Diagnosis not present

## 2023-01-06 DIAGNOSIS — E1142 Type 2 diabetes mellitus with diabetic polyneuropathy: Secondary | ICD-10-CM

## 2023-01-06 DIAGNOSIS — E1122 Type 2 diabetes mellitus with diabetic chronic kidney disease: Secondary | ICD-10-CM | POA: Diagnosis not present

## 2023-01-06 DIAGNOSIS — C221 Intrahepatic bile duct carcinoma: Secondary | ICD-10-CM | POA: Diagnosis not present

## 2023-01-06 DIAGNOSIS — I129 Hypertensive chronic kidney disease with stage 1 through stage 4 chronic kidney disease, or unspecified chronic kidney disease: Secondary | ICD-10-CM | POA: Diagnosis not present

## 2023-01-06 DIAGNOSIS — J4489 Other specified chronic obstructive pulmonary disease: Secondary | ICD-10-CM

## 2023-01-06 DIAGNOSIS — E785 Hyperlipidemia, unspecified: Secondary | ICD-10-CM | POA: Diagnosis not present

## 2023-01-06 DIAGNOSIS — E1169 Type 2 diabetes mellitus with other specified complication: Secondary | ICD-10-CM

## 2023-01-06 DIAGNOSIS — M5010 Cervical disc disorder with radiculopathy, unspecified cervical region: Secondary | ICD-10-CM | POA: Diagnosis not present

## 2023-01-06 DIAGNOSIS — I471 Supraventricular tachycardia, unspecified: Secondary | ICD-10-CM | POA: Diagnosis not present

## 2023-01-06 DIAGNOSIS — M7542 Impingement syndrome of left shoulder: Secondary | ICD-10-CM | POA: Diagnosis not present

## 2023-01-06 DIAGNOSIS — G8252 Quadriplegia, C1-C4 incomplete: Secondary | ICD-10-CM | POA: Diagnosis not present

## 2023-01-06 DIAGNOSIS — M4802 Spinal stenosis, cervical region: Secondary | ICD-10-CM | POA: Diagnosis not present

## 2023-01-06 DIAGNOSIS — G5602 Carpal tunnel syndrome, left upper limb: Secondary | ICD-10-CM | POA: Diagnosis not present

## 2023-01-06 DIAGNOSIS — N319 Neuromuscular dysfunction of bladder, unspecified: Secondary | ICD-10-CM | POA: Diagnosis not present

## 2023-01-06 DIAGNOSIS — K592 Neurogenic bowel, not elsewhere classified: Secondary | ICD-10-CM | POA: Diagnosis not present

## 2023-01-06 DIAGNOSIS — W19XXXS Unspecified fall, sequela: Secondary | ICD-10-CM | POA: Diagnosis not present

## 2023-01-06 DIAGNOSIS — E559 Vitamin D deficiency, unspecified: Secondary | ICD-10-CM | POA: Diagnosis not present

## 2023-01-06 DIAGNOSIS — D631 Anemia in chronic kidney disease: Secondary | ICD-10-CM | POA: Diagnosis not present

## 2023-01-06 DIAGNOSIS — S14122S Central cord syndrome at C2 level of cervical spinal cord, sequela: Secondary | ICD-10-CM | POA: Diagnosis not present

## 2023-01-06 DIAGNOSIS — E1165 Type 2 diabetes mellitus with hyperglycemia: Secondary | ICD-10-CM | POA: Diagnosis not present

## 2023-01-06 DIAGNOSIS — D696 Thrombocytopenia, unspecified: Secondary | ICD-10-CM | POA: Diagnosis not present

## 2023-01-08 NOTE — Telephone Encounter (Signed)
No answer,  I went ahead  and scheduled her for 06/24/23 and mailed her a letter

## 2023-01-13 ENCOUNTER — Emergency Department: Payer: 59

## 2023-01-13 ENCOUNTER — Inpatient Hospital Stay
Admission: EM | Admit: 2023-01-13 | Discharge: 2023-02-05 | DRG: 436 | Disposition: E | Payer: 59 | Attending: Student in an Organized Health Care Education/Training Program | Admitting: Student in an Organized Health Care Education/Training Program

## 2023-01-13 DIAGNOSIS — G8254 Quadriplegia, C5-C7 incomplete: Secondary | ICD-10-CM | POA: Diagnosis present

## 2023-01-13 DIAGNOSIS — R569 Unspecified convulsions: Secondary | ICD-10-CM | POA: Diagnosis not present

## 2023-01-13 DIAGNOSIS — K922 Gastrointestinal hemorrhage, unspecified: Secondary | ICD-10-CM

## 2023-01-13 DIAGNOSIS — M109 Gout, unspecified: Secondary | ICD-10-CM | POA: Diagnosis not present

## 2023-01-13 DIAGNOSIS — R7989 Other specified abnormal findings of blood chemistry: Secondary | ICD-10-CM | POA: Diagnosis not present

## 2023-01-13 DIAGNOSIS — E1142 Type 2 diabetes mellitus with diabetic polyneuropathy: Secondary | ICD-10-CM | POA: Diagnosis present

## 2023-01-13 DIAGNOSIS — D689 Coagulation defect, unspecified: Secondary | ICD-10-CM

## 2023-01-13 DIAGNOSIS — N179 Acute kidney failure, unspecified: Secondary | ICD-10-CM

## 2023-01-13 DIAGNOSIS — R791 Abnormal coagulation profile: Secondary | ICD-10-CM | POA: Diagnosis not present

## 2023-01-13 DIAGNOSIS — Z6825 Body mass index (BMI) 25.0-25.9, adult: Secondary | ICD-10-CM

## 2023-01-13 DIAGNOSIS — Z1152 Encounter for screening for COVID-19: Secondary | ICD-10-CM

## 2023-01-13 DIAGNOSIS — F17210 Nicotine dependence, cigarettes, uncomplicated: Secondary | ICD-10-CM | POA: Diagnosis present

## 2023-01-13 DIAGNOSIS — I471 Supraventricular tachycardia, unspecified: Secondary | ICD-10-CM | POA: Diagnosis present

## 2023-01-13 DIAGNOSIS — J309 Allergic rhinitis, unspecified: Secondary | ICD-10-CM | POA: Diagnosis not present

## 2023-01-13 DIAGNOSIS — S14122S Central cord syndrome at C2 level of cervical spinal cord, sequela: Secondary | ICD-10-CM | POA: Diagnosis not present

## 2023-01-13 DIAGNOSIS — Z794 Long term (current) use of insulin: Secondary | ICD-10-CM | POA: Diagnosis not present

## 2023-01-13 DIAGNOSIS — G8252 Quadriplegia, C1-C4 incomplete: Secondary | ICD-10-CM | POA: Diagnosis not present

## 2023-01-13 DIAGNOSIS — K7460 Unspecified cirrhosis of liver: Secondary | ICD-10-CM | POA: Diagnosis not present

## 2023-01-13 DIAGNOSIS — Z90710 Acquired absence of both cervix and uterus: Secondary | ICD-10-CM

## 2023-01-13 DIAGNOSIS — W19XXXS Unspecified fall, sequela: Secondary | ICD-10-CM | POA: Diagnosis not present

## 2023-01-13 DIAGNOSIS — Z981 Arthrodesis status: Secondary | ICD-10-CM

## 2023-01-13 DIAGNOSIS — D62 Acute posthemorrhagic anemia: Secondary | ICD-10-CM | POA: Diagnosis not present

## 2023-01-13 DIAGNOSIS — D649 Anemia, unspecified: Secondary | ICD-10-CM | POA: Diagnosis not present

## 2023-01-13 DIAGNOSIS — R531 Weakness: Secondary | ICD-10-CM | POA: Diagnosis not present

## 2023-01-13 DIAGNOSIS — E669 Obesity, unspecified: Secondary | ICD-10-CM | POA: Diagnosis present

## 2023-01-13 DIAGNOSIS — J4489 Other specified chronic obstructive pulmonary disease: Secondary | ICD-10-CM | POA: Diagnosis not present

## 2023-01-13 DIAGNOSIS — Z7985 Long-term (current) use of injectable non-insulin antidiabetic drugs: Secondary | ICD-10-CM

## 2023-01-13 DIAGNOSIS — E559 Vitamin D deficiency, unspecified: Secondary | ICD-10-CM | POA: Diagnosis not present

## 2023-01-13 DIAGNOSIS — I10 Essential (primary) hypertension: Secondary | ICD-10-CM | POA: Diagnosis present

## 2023-01-13 DIAGNOSIS — R400 Somnolence: Secondary | ICD-10-CM | POA: Diagnosis not present

## 2023-01-13 DIAGNOSIS — Z79899 Other long term (current) drug therapy: Secondary | ICD-10-CM

## 2023-01-13 DIAGNOSIS — K921 Melena: Secondary | ICD-10-CM | POA: Diagnosis not present

## 2023-01-13 DIAGNOSIS — D631 Anemia in chronic kidney disease: Secondary | ICD-10-CM | POA: Diagnosis not present

## 2023-01-13 DIAGNOSIS — R404 Transient alteration of awareness: Secondary | ICD-10-CM | POA: Diagnosis not present

## 2023-01-13 DIAGNOSIS — M7542 Impingement syndrome of left shoulder: Secondary | ICD-10-CM | POA: Diagnosis not present

## 2023-01-13 DIAGNOSIS — Z8249 Family history of ischemic heart disease and other diseases of the circulatory system: Secondary | ICD-10-CM

## 2023-01-13 DIAGNOSIS — K592 Neurogenic bowel, not elsewhere classified: Secondary | ICD-10-CM | POA: Diagnosis present

## 2023-01-13 DIAGNOSIS — I129 Hypertensive chronic kidney disease with stage 1 through stage 4 chronic kidney disease, or unspecified chronic kidney disease: Secondary | ICD-10-CM | POA: Diagnosis not present

## 2023-01-13 DIAGNOSIS — M1990 Unspecified osteoarthritis, unspecified site: Secondary | ICD-10-CM | POA: Diagnosis present

## 2023-01-13 DIAGNOSIS — N1831 Chronic kidney disease, stage 3a: Secondary | ICD-10-CM | POA: Diagnosis not present

## 2023-01-13 DIAGNOSIS — E1122 Type 2 diabetes mellitus with diabetic chronic kidney disease: Secondary | ICD-10-CM | POA: Diagnosis not present

## 2023-01-13 DIAGNOSIS — Z9181 History of falling: Secondary | ICD-10-CM

## 2023-01-13 DIAGNOSIS — Z841 Family history of disorders of kidney and ureter: Secondary | ICD-10-CM

## 2023-01-13 DIAGNOSIS — K7682 Hepatic encephalopathy: Secondary | ICD-10-CM | POA: Diagnosis present

## 2023-01-13 DIAGNOSIS — R197 Diarrhea, unspecified: Secondary | ICD-10-CM | POA: Diagnosis not present

## 2023-01-13 DIAGNOSIS — M5010 Cervical disc disorder with radiculopathy, unspecified cervical region: Secondary | ICD-10-CM | POA: Diagnosis not present

## 2023-01-13 DIAGNOSIS — N319 Neuromuscular dysfunction of bladder, unspecified: Secondary | ICD-10-CM | POA: Diagnosis not present

## 2023-01-13 DIAGNOSIS — I700 Atherosclerosis of aorta: Secondary | ICD-10-CM | POA: Diagnosis not present

## 2023-01-13 DIAGNOSIS — Z888 Allergy status to other drugs, medicaments and biological substances status: Secondary | ICD-10-CM

## 2023-01-13 DIAGNOSIS — L89321 Pressure ulcer of left buttock, stage 1: Secondary | ICD-10-CM | POA: Diagnosis not present

## 2023-01-13 DIAGNOSIS — I469 Cardiac arrest, cause unspecified: Secondary | ICD-10-CM | POA: Diagnosis not present

## 2023-01-13 DIAGNOSIS — Z8719 Personal history of other diseases of the digestive system: Secondary | ICD-10-CM | POA: Diagnosis not present

## 2023-01-13 DIAGNOSIS — M1030 Gout due to renal impairment, unspecified site: Secondary | ICD-10-CM | POA: Diagnosis not present

## 2023-01-13 DIAGNOSIS — M4802 Spinal stenosis, cervical region: Secondary | ICD-10-CM | POA: Diagnosis not present

## 2023-01-13 DIAGNOSIS — Z8505 Personal history of malignant neoplasm of liver: Secondary | ICD-10-CM

## 2023-01-13 DIAGNOSIS — G5602 Carpal tunnel syndrome, left upper limb: Secondary | ICD-10-CM | POA: Diagnosis not present

## 2023-01-13 DIAGNOSIS — E1165 Type 2 diabetes mellitus with hyperglycemia: Secondary | ICD-10-CM | POA: Diagnosis not present

## 2023-01-13 DIAGNOSIS — Z91048 Other nonmedicinal substance allergy status: Secondary | ICD-10-CM

## 2023-01-13 DIAGNOSIS — E7220 Disorder of urea cycle metabolism, unspecified: Secondary | ICD-10-CM

## 2023-01-13 DIAGNOSIS — E785 Hyperlipidemia, unspecified: Secondary | ICD-10-CM | POA: Diagnosis not present

## 2023-01-13 DIAGNOSIS — C221 Intrahepatic bile duct carcinoma: Principal | ICD-10-CM | POA: Diagnosis present

## 2023-01-13 DIAGNOSIS — Z743 Need for continuous supervision: Secondary | ICD-10-CM | POA: Diagnosis not present

## 2023-01-13 DIAGNOSIS — S12191S Other nondisplaced fracture of second cervical vertebra, sequela: Secondary | ICD-10-CM | POA: Diagnosis not present

## 2023-01-13 DIAGNOSIS — Z9221 Personal history of antineoplastic chemotherapy: Secondary | ICD-10-CM

## 2023-01-13 DIAGNOSIS — R58 Hemorrhage, not elsewhere classified: Secondary | ICD-10-CM | POA: Diagnosis not present

## 2023-01-13 DIAGNOSIS — D696 Thrombocytopenia, unspecified: Secondary | ICD-10-CM | POA: Diagnosis not present

## 2023-01-13 DIAGNOSIS — N189 Chronic kidney disease, unspecified: Secondary | ICD-10-CM | POA: Diagnosis not present

## 2023-01-13 LAB — COMPREHENSIVE METABOLIC PANEL
ALT: 31 U/L (ref 0–44)
AST: 100 U/L — ABNORMAL HIGH (ref 15–41)
Albumin: <1.5 g/dL — ABNORMAL LOW (ref 3.5–5.0)
Alkaline Phosphatase: 321 U/L — ABNORMAL HIGH (ref 38–126)
Anion gap: 11 (ref 5–15)
BUN: 34 mg/dL — ABNORMAL HIGH (ref 8–23)
CO2: 16 mmol/L — ABNORMAL LOW (ref 22–32)
Calcium: 6.9 mg/dL — ABNORMAL LOW (ref 8.9–10.3)
Chloride: 109 mmol/L (ref 98–111)
Creatinine, Ser: 3.07 mg/dL — ABNORMAL HIGH (ref 0.44–1.00)
GFR, Estimated: 16 mL/min — ABNORMAL LOW (ref >60)
Glucose, Bld: 85 mg/dL (ref 70–99)
Potassium: 4.2 mmol/L (ref 3.5–5.1)
Sodium: 136 mmol/L (ref 135–145)
Total Bilirubin: 4.3 mg/dL — ABNORMAL HIGH (ref 0.3–1.2)
Total Protein: 6.8 g/dL (ref 6.5–8.1)

## 2023-01-13 LAB — APTT: aPTT: 89 seconds — ABNORMAL HIGH (ref 24–36)

## 2023-01-13 LAB — TYPE AND SCREEN

## 2023-01-13 LAB — PROTIME-INR
INR: 3.3 — ABNORMAL HIGH (ref 0.8–1.2)
Prothrombin Time: 33.6 seconds — ABNORMAL HIGH (ref 11.4–15.2)

## 2023-01-13 MED ORDER — SODIUM CHLORIDE 0.9 % IV BOLUS
1000.0000 mL | Freq: Once | INTRAVENOUS | Status: AC
  Administered 2023-01-14: 1000 mL via INTRAVENOUS

## 2023-01-13 MED ORDER — PANTOPRAZOLE 80MG IVPB - SIMPLE MED
80.0000 mg | Freq: Once | INTRAVENOUS | Status: AC
  Administered 2023-01-14: 80 mg via INTRAVENOUS
  Filled 2023-01-13: qty 100

## 2023-01-13 MED ORDER — PANTOPRAZOLE INFUSION (NEW) - SIMPLE MED
8.0000 mg/h | INTRAVENOUS | Status: DC
  Administered 2023-01-14: 8 mg/h via INTRAVENOUS
  Filled 2023-01-13: qty 100

## 2023-01-13 NOTE — Telephone Encounter (Signed)
verbal orders given

## 2023-01-13 NOTE — ED Provider Notes (Signed)
[facility] Provider Note    Event Date/Time   First MD Initiated Contact with Patient 01/28/2023 2304     (approximate)   History   GI Problem (GI bleed)   HPI  Level V caveat: Limited by decreased mentation  Janice Short is a 62 y.o. female brought to the ED via EMS from home with a chief complaint of altered mental status and GI bleed.  Patient with a history of intrahepatic cholangiocarcinoma seen by oncology whose family called EMS in reference to decreased mental status and bloody stool.  Patient moaning, disoriented.  Rest of history is limited secondary to decreased mental status.     Past Medical History   Past Medical History:  Diagnosis Date   Allergy    Asthma    Carpal tunnel syndrome on left    Cervical radiculitis    Chronic osteoarthritis    Corns and callosity    Depression    Dermatophytosis of foot    Diabetes mellitus without complication (HCC)    Gout    Hyperlipidemia    Hypertension    Impingement syndrome of left shoulder    Intrahepatic cholangiocarcinoma ([place])    Lumbosacral neuritis    Microalbuminuria    Obesity    Supraventricular tachycardia 07/01/2015   SVT (supraventricular tachycardia)    Tenosynovitis of wrist    Vitamin D deficiency      Active Problem List   Patient Active Problem List   Diagnosis Date Noted   Upper GI bleeding 22-Jan-2023   UTI (urinary tract infection) 10/27/2022   Hypocalcemia 10/27/2022   Acute incomplete quadriplegia ([place]) 10/08/2022   Neurogenic bowel 10/08/2022   Pressure ulcer, stage 3 ([place]) 10/08/2022   Fever 10/07/2022   Head injury    Pressure injury of skin 10/06/2022   Central cord syndrome ([place]) 10/02/2022   Cervical spine instability 10/02/2022   Fall 10/01/2022   Closed fracture of C2 vertebra ([place]) 10/01/2022   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) 09/25/2022   Tobacco use 09/25/2022   Rectal polyp    Polyp of sigmoid colon    Polyp of duodenum    Intrahepatic cholangiocarcinoma ([place]) 04/12/2021   Goals of care, counseling/discussion 04/12/2021   Mild episode of recurrent major depressive disorder ([place]) 02/08/2019   Uncontrolled type 2 diabetes mellitus with hyperglycemia, with long-term current use of insulin ([place]) 12/17/2017   Spinal stenosis in cervical region 10/15/2016   Diabetic polyneuropathy associated with type 2 diabetes mellitus ([place]) 07/14/2016   Chronic midline low back pain with sciatica 06/22/2016   Primary osteoarthritis of both knees 06/22/2016   Carpal tunnel syndrome 07/01/2015   Cervical radiculitis 07/01/2015   Corn or callus 07/01/2015   Impingement syndrome of shoulder 07/01/2015   Microalbuminuria 07/01/2015   Tenosynovitis of wrist 07/01/2015   Hypertension 11/05/2009   Athlete's foot 08/05/2009   Vitamin D deficiency 01/08/2009   Allergic rhinitis 12/28/2008   Lumbosacral neuritis 08/11/2007     Past Surgical History   Past Surgical History:  Procedure Laterality Date   ABDOMINAL HYSTERECTOMY     ANTERIOR CERVICAL DECOMP/DISCECTOMY FUSION N/A 10/02/2022   Procedure: ANTERIOR CERVICAL DECOMPRESSION/DISCECTOMY FUSION 1 LEVEL;  Surgeon: Meade Maw, MD;  [facility]  ORS;  Service: Neurosurgery;  Laterality: N/A;  ACDF C4-5.   COLONOSCOPY WITH PROPOFOL N/A 05/06/2021   Procedure: COLONOSCOPY WITH PROPOFOL;  Surgeon: Lucilla Lame, MD;  Location[facility] ENDOSCOPY;  Service: Endoscopy;  Laterality: N/A;   ESOPHAGOGASTRODUODENOSCOPY (EGD) WITH PROPOFOL N/A 05/06/2021   Procedure: ESOPHAGOGASTRODUODENOSCOPY (EGD) WITH PROPOFOL;  Surgeon: Lucilla Lame, MD;  Location: [facility] ENDOSCOPY;  Service: Endoscopy;  Laterality: N/A;   PORTA CATH INSERTION  N/A 07/17/2021   Procedure: PORTA CATH INSERTION;  Surgeon: Algernon Huxley, MD;  Location: [place] CV LAB;  Service: Cardiovascular;  Laterality: N/A;     Home Medications   Prior to Admission medications   Medication Sig Start Date End Date Taking? Authorizing Provider  albuterol (VENTOLIN HFA) 108 (90 Base) MCG/ACT inhaler INHALE TWO PUFFS BY MOUTH EVERY 6 HOURS AS NEEDED FOR SHORTNESS OF BREATH OR WHEEZING 01/05/23   Ancil Boozer, Drue Stager, MD  amLODipine (NORVASC) 2.5 MG tablet Take 1 tablet (2.5 mg total) by mouth daily. 10/27/22   Love, Ivan Anchors, PA-C  amoxicillin (AMOXIL) 500 MG capsule Take 1 capsule (500 mg total) by mouth 2 (two) times daily. 10/28/22   Lovorn, Jinny Blossom, MD  atorvastatin (LIPITOR) 40 MG tablet Take 1 tablet (40 mg total) by mouth daily. 09/25/22   Steele Sizer, MD  calcium carbonate (OSCAL) 1500 (600 Ca) MG TABS tablet Take 1 tablet (1,500 mg total) by mouth 3 (three) times daily with meals. 10/28/22   Love, Ivan Anchors, PA-C  diclofenac Sodium (VOLTAREN) 1 % GEL Apply 2 g topically 4 (four) times daily. 10/27/22   Love, Ivan Anchors, PA-C  escitalopram (LEXAPRO) 20 MG tablet TAKE ONE TABLET BY MOUTH DAILY AT 9AM 09/25/22   Steele Sizer, MD  fluticasone (FLONASE) 50 MCG/ACT nasal spray Place 2 sprays into both nostrils daily as needed. USE 2 SPRAY(S) IN EACH NOSTRIL AT BEDTIME 07/06/22   Sowles, Drue Stager, MD  Fluticasone-Umeclidin-Vilant (TRELEGY ELLIPTA) 100-62.5-25 MCG/ACT AEPB Inhale 1 puff into the lungs daily. 09/25/22   Steele Sizer, MD  gabapentin (NEURONTIN) 300 MG capsule TAKE TWO CAPSULES BY MOUTH TWICE DAILY @ 9AM & 5PM 09/25/22   Steele Sizer, MD  hydrocortisone cream 1 % Apply topically 2 (two) times daily. 10/27/22   Love, Ivan Anchors, PA-C  Insulin Pen Needle 32G X 4 MM MISC Use at bedtime with insulin 10/28/22   Love, Ivan Anchors, PA-C  lidocaine-prilocaine (EMLA) cream APPLY ONE APPLICATION TOPICALLY AS NEEDED APPLY TO PORT AND COVER WITH SARAN WRAP 1 TO 2 HOURS  PRIOR TO PORT ACCESS (BULK) 12/16/22   Sindy Guadeloupe, MD  ondansetron (ZOFRAN) 4 MG tablet Take 1 tablet (4 mg total) by mouth every 6 (six) hours as needed for nausea. 10/27/22   Love, Ivan Anchors, PA-C  senna-docusate (SENOKOT-S) 8.6-50 MG tablet Take 1 tablet by mouth daily. 10/27/22   Love, Ivan Anchors, PA-C  TRESIBA FLEXTOUCH 100 UNIT/ML FlexTouch Pen INJECT 20 UNITS SUBCUTANEOUSLY DAILY (BULK) 01/06/23   Steele Sizer, MD  witch hazel-glycerin (TUCKS) pad Apply topically as needed for itching. 10/27/22   Love, Ivan Anchors, PA-C  Zinc Sulfate 220 (50 Zn) MG TABS Take 1 tablet (220 mg total) by mouth daily. 10/27/22   Love, Ivan Anchors, PA-C     Allergies  Other, Platinol [cisplatin], and Bydureon [exenatide]   Family History   Family History  Problem Relation Age of Onset   Heart attack Mother    CAD Mother    Kidney disease Father      Physical Exam  Triage Vital Signs: ED Triage Vitals  Enc Vitals Group     BP 01/23/2023 2245 (!) 158/85     Pulse Rate 01/19/2023 2245 91     Resp 01/17/2023 2245 (!) 22     Temp --      Temp src --      SpO2 01/10/2023 2243 100 %     Weight --      Height --      Head Circumference --      Peak Flow --      Pain Score --      Pain Loc --      Pain Edu? --      Excl. in [place]? --     Updated Vital Signs: BP (!) 158/85   Pulse 91   Temp 97.8 F (36.6 C) (Axillary)   Resp (!) 22   SpO2 100%    General: Somnolent, moderate distress.  Awakes to verbal stimuli. CV:  RRR.  Good peripheral perfusion.  Resp:  Increased effort.  CTAB. Abd:  Nontender.  No distention.  Other:  Opens eyes to command.  Moaning.  Somnolent.   ED Results / Procedures / Treatments  Labs (all labs ordered are listed, but only abnormal results are displayed) Labs Reviewed  CBC WITH DIFFERENTIAL/PLATELET - Abnormal; Notable for the following components:      Result Value   RBC 2.44 (*)    Hemoglobin 7.2 (*)    HCT 22.4 (*)    RDW 22.1 (*)    Platelets 84 (*)    All  other components within normal limits  COMPREHENSIVE METABOLIC PANEL - Abnormal; Notable for the following components:   CO2 16 (*)    BUN 34 (*)    Creatinine, Ser 3.07 (*)    Calcium 6.9 (*)    Albumin <1.5 (*)    AST 100 (*)    Alkaline Phosphatase 321 (*)    Total Bilirubin 4.3 (*)    GFR, Estimated 16 (*)    All other components within normal limits  PROTIME-INR - Abnormal; Notable for the following components:   Prothrombin Time 33.6 (*)    INR 3.3 (*)    All other components within normal limits  APTT - Abnormal; Notable for the following components:   aPTT 89 (*)    All other components within normal limits  AMMONIA - Abnormal; Notable for the following components:   Ammonia 231 (*)    All other components within normal limits  LACTIC ACID, PLASMA - Abnormal; Notable for the following components:   Lactic Acid, Venous 5.4 (*)    All other components within normal limits  TROPONIN I (HIGH SENSITIVITY) - Abnormal; Notable for the following components:   Troponin I (High Sensitivity) 27 (*)    All other components within normal limits  RESP PANEL BY RT-PCR (RSV, FLU A&B, COVID)  [id number]  LACTIC ACID, PLASMA  TYPE AND SCREEN  TYPE AND SCREEN  PREPARE RBC (CROSSMATCH)     EKG  ED ECG REPORT I, Denali Becvar J, the attending physician, personally viewed and interpreted this ECG.   Date: 01/29/2023  EKG Time: 0012  Rate: 82  Rhythm: normal sinus rhythm  Axis: Normal  Intervals:none  ST&T Change: Nonspecific    RADIOLOGY I have independently visualized and interpreted patient's CT and chest x-ray as well as noted the radiology interpretation:  CT head: No ICH  Chest x-ray: No acute cardiopulmonary process  Official radiology report(s): CT Head Wo  Contrast  Result Date: 01/15/23 CLINICAL DATA:  Mental status change of unknown cause. Gastrointestinal bleeding is ongoing. EXAM: CT HEAD WITHOUT CONTRAST TECHNIQUE: Contiguous axial images were obtained from the  base of the skull through the vertex without intravenous contrast. RADIATION DOSE REDUCTION: This exam was performed according to the departmental dose-optimization program which includes automated exposure control, adjustment of the mA and/or kV according to patient size and/or use of iterative reconstruction technique. COMPARISON:  10/01/2022 FINDINGS: Brain: Patchy subcortical low-attenuation changes are demonstrated bilaterally. Changes are more prominent than on the prior study. Probably this represents small vessel ischemic changes but early infarcts are not excluded. Consider MRI for further evaluation. No mass-effect or midline shift. No abnormal extra-axial fluid collections. Gray-white matter junctions are distinct. Basal cisterns are not effaced. No acute intracranial hemorrhage. Vascular: No hyperdense vessel or unexpected calcification. Skull: Normal. Negative for fracture or focal lesion. Sinuses/Orbits: No acute finding. Other: None. IMPRESSION: Nonspecific low-attenuation changes in the subcortical white matter. Consider MRI to exclude acute ischemic changes. No acute intracranial hemorrhage or mass effect. Electronically Signed   By: Lucienne Capers M.D.   On: 01-15-23 00:00   DG Chest Port 1 View  Result Date: 01/12/2023 CLINICAL DATA:  GI bleed. EXAM: PORTABLE CHEST 1 VIEW COMPARISON:  Chest radiograph dated 10/12/2022. FINDINGS: Right-sided Port-A-Cath with tip in the region of the cavoatrial junction. No focal consolidation, pleural effusion, or pneumothorax. Stable cardiac silhouette. Atherosclerotic calcification of the aorta. No acute osseous pathology. IMPRESSION: No active disease. Electronically Signed   By: Anner Crete M.D.   On: 01/29/2023 23:25     PROCEDURES:  Rectal exam: Large volume gross melena noted   Critical Care performed: Yes, see critical care procedure note(s)  CRITICAL CARE Performed by: Paulette Blanch   Total critical care time: 45 minutes  Critical  care time was exclusive of separately billable procedures and treating other patients.  Critical care was necessary to treat or prevent imminent or life-threatening deterioration.  Critical care was time spent personally by me on the following activities: development of treatment plan with patient and/or surrogate as well as nursing, discussions with consultants, evaluation of patient's response to treatment, examination of patient, obtaining history from patient or surrogate, ordering and performing treatments and interventions, ordering and review of laboratory studies, ordering and review of radiographic studies, pulse oximetry and re-evaluation of patient's condition.  Marland Kitchen1-3 Lead EKG Interpretation  Performed by: Paulette Blanch, MD Authorized by: Paulette Blanch, MD     Interpretation: normal     ECG rate:  90   ECG rate assessment: normal     Rhythm: sinus rhythm     Ectopy: none     Conduction: normal   Comments:     Patient placed on cardiac monitor to evaluate for arrhythmias    MEDICATIONS ORDERED IN ED: Medications  pantoprozole (PROTONIX) 80 mg /NS 100 mL infusion (8 mg/hr Intravenous New Bag/Given 01/15/2023 0014)  0.9 %  sodium chloride infusion (has no administration in time range)  lactulose (CHRONULAC) enema 200 gm (has no administration in time range)  sodium chloride 0.9 % bolus 1,000 mL (1,000 mLs Intravenous New Bag/Given 2023/01/15 0004)  pantoprazole (PROTONIX) 80 mg /NS 100 mL IVPB (80 mg Intravenous New Bag/Given 01/15/23 0006)     IMPRESSION / MDM / ASSESSMENT AND PLAN / ED COURSE  I reviewed the triage vital signs and the nursing notes.  62 year old female presenting with decreased mental state and rectal bleeding. Differential diagnosis includes, but is not limited to, alcohol, illicit or prescription medications, or other toxic ingestion; intracranial pathology such as stroke or intracerebral hemorrhage; fever or infectious causes including  sepsis; hypoxemia and/or hypercarbia; uremia; trauma; endocrine related disorders such as diabetes, hypoglycemia, and thyroid-related diseases; hypertensive encephalopathy; etc. Differential diagnosis includes, but is not limited to, biliary disease (biliary colic, acute cholecystitis, cholangitis, choledocholithiasis, etc), intrathoracic causes for epigastric abdominal pain including ACS, gastritis, duodenitis, pancreatitis, small bowel or large bowel obstruction, abdominal aortic aneurysm, hernia, and ulcer(s).  I have personally reviewed patient's records and note her oncology office visit from 11/10/2022.  At that time she had been off chemotherapy for about 2 months and further chemotherapy was on hold given her deconditioned state, fall with central cord syndrome requiring surgery.  Patient's presentation is most consistent with acute presentation with potential threat to life or bodily function.  The patient is on the cardiac monitor to evaluate for evidence of arrhythmia and/or significant heart rate changes.  Will obtain lab work including ammonia level, CT head to evaluate intracranial abnormalities.  Patient initially hypotensive; blood pressure improved with 500 cc IV normal saline.  Continue aggressive IV fluid resuscitation, start Protonix bolus with drip.  Anticipate hospitalization.  Clinical Course as of 02-11-23 0034  11-Feb-2023  0009 Hemoglobin 7.2.  No family present at bedside.  Hold off transfusion for now as hemoglobin greater than 7 and patient remains hemodynamically stable.  Will consult hospitalist services for evaluation and admission. [JS]  0033 Discussed with Dr. Lodema Pilot from hospital services who agrees patient should receive 1 unit emergency blood given active GI bleed.  Family unavailable.  Lactic acid noted.  Do not suspect sepsis but rather feel elevation of lactic acid secondary to uremia and hyperammonemia. [JS]    Clinical Course User Index [JS] Paulette Blanch, MD      FINAL CLINICAL IMPRESSION(S) / ED DIAGNOSES   Final diagnoses:  Melena  UGIB (upper gastrointestinal bleed)  Coagulopathy ([place])  Acute renal failure, unspecified acute renal failure type ([place])  Hyperammonemia ([place])     Rx / DC Orders   ED Discharge Orders     None        Note:  This document was prepared using Dragon voice recognition software and may include unintentional dictation errors.   Paulette Blanch, MD 2023/02/11 [phone]

## 2023-01-13 NOTE — ED Triage Notes (Signed)
Per EMS pt arrived from home with noted GI bleed, ems sts pt has liquid dark red stool. Pt does not appear oriented at this time. Per EMS symptoms started 8 hours ago.

## 2023-01-13 NOTE — Telephone Encounter (Unsigned)
Copied from [place] [id number]. Topic: General - Other >> Jan 13, 2023  8:36 AM Ludger Nutting wrote: Home Health Verbal Orders - Caller/Agency: [facility] Number: [phone] Requesting OT/PT/Skilled Nursing/Social Work/Speech Therapy: PT Frequency: 1w9

## 2023-01-14 ENCOUNTER — Encounter: Payer: Self-pay | Admitting: Family Medicine

## 2023-01-14 ENCOUNTER — Other Ambulatory Visit: Payer: Self-pay

## 2023-01-14 DIAGNOSIS — R569 Unspecified convulsions: Secondary | ICD-10-CM | POA: Diagnosis not present

## 2023-01-14 DIAGNOSIS — K7682 Hepatic encephalopathy: Secondary | ICD-10-CM | POA: Diagnosis present

## 2023-01-14 DIAGNOSIS — K592 Neurogenic bowel, not elsewhere classified: Secondary | ICD-10-CM | POA: Diagnosis present

## 2023-01-14 DIAGNOSIS — D689 Coagulation defect, unspecified: Secondary | ICD-10-CM

## 2023-01-14 DIAGNOSIS — I10 Essential (primary) hypertension: Secondary | ICD-10-CM

## 2023-01-14 DIAGNOSIS — E1142 Type 2 diabetes mellitus with diabetic polyneuropathy: Secondary | ICD-10-CM

## 2023-01-14 DIAGNOSIS — E785 Hyperlipidemia, unspecified: Secondary | ICD-10-CM | POA: Insufficient documentation

## 2023-01-14 DIAGNOSIS — J309 Allergic rhinitis, unspecified: Secondary | ICD-10-CM | POA: Diagnosis present

## 2023-01-14 DIAGNOSIS — N179 Acute kidney failure, unspecified: Secondary | ICD-10-CM | POA: Diagnosis present

## 2023-01-14 DIAGNOSIS — I469 Cardiac arrest, cause unspecified: Secondary | ICD-10-CM | POA: Diagnosis not present

## 2023-01-14 DIAGNOSIS — Z794 Long term (current) use of insulin: Secondary | ICD-10-CM | POA: Diagnosis not present

## 2023-01-14 DIAGNOSIS — C221 Intrahepatic bile duct carcinoma: Secondary | ICD-10-CM | POA: Diagnosis present

## 2023-01-14 DIAGNOSIS — M109 Gout, unspecified: Secondary | ICD-10-CM | POA: Diagnosis present

## 2023-01-14 DIAGNOSIS — M1990 Unspecified osteoarthritis, unspecified site: Secondary | ICD-10-CM | POA: Diagnosis present

## 2023-01-14 DIAGNOSIS — K7460 Unspecified cirrhosis of liver: Secondary | ICD-10-CM | POA: Diagnosis present

## 2023-01-14 DIAGNOSIS — N1831 Chronic kidney disease, stage 3a: Secondary | ICD-10-CM | POA: Diagnosis present

## 2023-01-14 DIAGNOSIS — I471 Supraventricular tachycardia, unspecified: Secondary | ICD-10-CM | POA: Diagnosis present

## 2023-01-14 DIAGNOSIS — Z1152 Encounter for screening for COVID-19: Secondary | ICD-10-CM | POA: Diagnosis not present

## 2023-01-14 DIAGNOSIS — F17210 Nicotine dependence, cigarettes, uncomplicated: Secondary | ICD-10-CM | POA: Diagnosis present

## 2023-01-14 DIAGNOSIS — R791 Abnormal coagulation profile: Secondary | ICD-10-CM | POA: Diagnosis present

## 2023-01-14 DIAGNOSIS — I129 Hypertensive chronic kidney disease with stage 1 through stage 4 chronic kidney disease, or unspecified chronic kidney disease: Secondary | ICD-10-CM | POA: Diagnosis present

## 2023-01-14 DIAGNOSIS — D62 Acute posthemorrhagic anemia: Secondary | ICD-10-CM | POA: Diagnosis present

## 2023-01-14 DIAGNOSIS — R400 Somnolence: Secondary | ICD-10-CM | POA: Diagnosis present

## 2023-01-14 DIAGNOSIS — N189 Chronic kidney disease, unspecified: Secondary | ICD-10-CM

## 2023-01-14 DIAGNOSIS — E1122 Type 2 diabetes mellitus with diabetic chronic kidney disease: Secondary | ICD-10-CM | POA: Diagnosis present

## 2023-01-14 DIAGNOSIS — G8254 Quadriplegia, C5-C7 incomplete: Secondary | ICD-10-CM | POA: Diagnosis present

## 2023-01-14 DIAGNOSIS — K922 Gastrointestinal hemorrhage, unspecified: Secondary | ICD-10-CM

## 2023-01-14 DIAGNOSIS — K921 Melena: Secondary | ICD-10-CM | POA: Diagnosis present

## 2023-01-14 DIAGNOSIS — E669 Obesity, unspecified: Secondary | ICD-10-CM | POA: Diagnosis present

## 2023-01-14 LAB — CBC WITH DIFFERENTIAL/PLATELET
Abs Immature Granulocytes: 0.06 10*3/uL (ref 0.00–0.07)
Basophils Absolute: 0 10*3/uL (ref 0.0–0.1)
Basophils Relative: 0 %
Eosinophils Absolute: 0 10*3/uL (ref 0.0–0.5)
Eosinophils Relative: 0 %
HCT: 22.4 % — ABNORMAL LOW (ref 36.0–46.0)
Hemoglobin: 7.2 g/dL — ABNORMAL LOW (ref 12.0–15.0)
Immature Granulocytes: 1 %
Lymphocytes Relative: 10 %
Lymphs Abs: 1 10*3/uL (ref 0.7–4.0)
MCH: 29.5 pg (ref 26.0–34.0)
MCHC: 32.1 g/dL (ref 30.0–36.0)
MCV: 91.8 fL (ref 80.0–100.0)
Monocytes Absolute: 0.6 10*3/uL (ref 0.1–1.0)
Monocytes Relative: 6 %
Neutro Abs: 7.7 10*3/uL (ref 1.7–7.7)
Neutrophils Relative %: 83 %
Platelets: 84 10*3/uL — ABNORMAL LOW (ref 150–400)
RBC: 2.44 MIL/uL — ABNORMAL LOW (ref 3.87–5.11)
RDW: 22.1 % — ABNORMAL HIGH (ref 11.5–15.5)
Smear Review: NORMAL
WBC: 9.3 10*3/uL (ref 4.0–10.5)
nRBC: 0.2 % (ref 0.0–0.2)

## 2023-01-14 LAB — RESP PANEL BY RT-PCR (RSV, FLU A&B, COVID)  RVPGX2
Influenza A by PCR: NEGATIVE
Influenza B by PCR: NEGATIVE
Resp Syncytial Virus by PCR: NEGATIVE
SARS Coronavirus 2 by RT PCR: NEGATIVE

## 2023-01-14 LAB — LACTIC ACID, PLASMA
Lactic Acid, Venous: 5.4 mmol/L (ref 0.5–1.9)
Lactic Acid, Venous: 7.7 mmol/L (ref 0.5–1.9)

## 2023-01-14 LAB — COMPREHENSIVE METABOLIC PANEL
ALT: 31 U/L (ref 0–44)
AST: 105 U/L — ABNORMAL HIGH (ref 15–41)
Albumin: <1.5 g/dL — ABNORMAL LOW (ref 3.5–5.0)
Alkaline Phosphatase: 309 U/L — ABNORMAL HIGH (ref 38–126)
Anion gap: 9 (ref 5–15)
BUN: 35 mg/dL — ABNORMAL HIGH (ref 8–23)
CO2: 17 mmol/L — ABNORMAL LOW (ref 22–32)
Calcium: 6.9 mg/dL — ABNORMAL LOW (ref 8.9–10.3)
Chloride: 112 mmol/L — ABNORMAL HIGH (ref 98–111)
Creatinine, Ser: 3.07 mg/dL — ABNORMAL HIGH (ref 0.44–1.00)
GFR, Estimated: 16 mL/min — ABNORMAL LOW (ref >60)
Glucose, Bld: 94 mg/dL (ref 70–99)
Potassium: 4.4 mmol/L (ref 3.5–5.1)
Sodium: 138 mmol/L (ref 135–145)
Total Bilirubin: 4.1 mg/dL — ABNORMAL HIGH (ref 0.3–1.2)
Total Protein: 6.6 g/dL (ref 6.5–8.1)

## 2023-01-14 LAB — PROTIME-INR
INR: 3.9 — ABNORMAL HIGH (ref 0.8–1.2)
Prothrombin Time: 37.7 seconds — ABNORMAL HIGH (ref 11.4–15.2)

## 2023-01-14 LAB — CBC
HCT: 24.6 % — ABNORMAL LOW (ref 36.0–46.0)
Hemoglobin: 8.1 g/dL — ABNORMAL LOW (ref 12.0–15.0)
MCH: 30.2 pg (ref 26.0–34.0)
MCHC: 32.9 g/dL (ref 30.0–36.0)
MCV: 91.8 fL (ref 80.0–100.0)
Platelets: 86 10*3/uL — ABNORMAL LOW (ref 150–400)
RBC: 2.68 MIL/uL — ABNORMAL LOW (ref 3.87–5.11)
RDW: 20 % — ABNORMAL HIGH (ref 11.5–15.5)
WBC: 11.6 10*3/uL — ABNORMAL HIGH (ref 4.0–10.5)
nRBC: 0.3 % — ABNORMAL HIGH (ref 0.0–0.2)

## 2023-01-14 LAB — TROPONIN I (HIGH SENSITIVITY)
Troponin I (High Sensitivity): 27 ng/L — ABNORMAL HIGH (ref <18)
Troponin I (High Sensitivity): 30 ng/L — ABNORMAL HIGH (ref <18)

## 2023-01-14 LAB — AMMONIA: Ammonia: 231 umol/L — ABNORMAL HIGH (ref 9–35)

## 2023-01-14 MED ORDER — ONDANSETRON HCL 4 MG/2ML IJ SOLN: 4.0000 mg | Freq: Four times a day (QID) | INTRAMUSCULAR | Status: DC | PRN

## 2023-01-14 MED ORDER — SODIUM CHLORIDE 0.9 % IV SOLN: INTRAVENOUS | Status: DC

## 2023-01-14 MED ORDER — SODIUM CHLORIDE 0.9% IV SOLUTION
Freq: Once | INTRAVENOUS | Status: AC
  Filled 2023-01-14: qty 250

## 2023-01-14 MED ORDER — AMLODIPINE BESYLATE 5 MG PO TABS: 2.5000 mg | ORAL_TABLET | Freq: Every day | ORAL | Status: DC

## 2023-01-14 MED ORDER — ATORVASTATIN CALCIUM 20 MG PO TABS: 40.0000 mg | ORAL_TABLET | Freq: Every day | ORAL | Status: DC

## 2023-01-14 MED ORDER — CALCIUM CARBONATE 1250 (500 CA) MG PO TABS: 1.0000 | ORAL_TABLET | Freq: Three times a day (TID) | ORAL | Status: DC

## 2023-01-14 MED ORDER — ZINC SULFATE 220 (50 ZN) MG PO CAPS: 220.0000 mg | ORAL_CAPSULE | Freq: Every day | ORAL | Status: DC

## 2023-01-14 MED ORDER — AMIODARONE HCL IN DEXTROSE 360-4.14 MG/200ML-% IV SOLN: 30.0000 mg/h | INTRAVENOUS | Status: DC

## 2023-01-14 MED ORDER — ETOMIDATE 2 MG/ML IV SOLN
INTRAVENOUS | Status: AC
  Filled 2023-01-14: qty 20

## 2023-01-14 MED ORDER — SODIUM CHLORIDE 0.9% FLUSH: 10.0000 mL | Freq: Two times a day (BID) | INTRAVENOUS | Status: DC

## 2023-01-14 MED ORDER — SODIUM CHLORIDE 0.9% FLUSH: 10.0000 mL | INTRAVENOUS | Status: DC | PRN

## 2023-01-14 MED ORDER — FLUTICASONE FUROATE-VILANTEROL 100-25 MCG/ACT IN AEPB: 1.0000 | INHALATION_SPRAY | Freq: Every day | RESPIRATORY_TRACT | Status: DC

## 2023-01-14 MED ORDER — MELATONIN 5 MG PO TABS: 2.5000 mg | ORAL_TABLET | Freq: Every evening | ORAL | Status: DC | PRN

## 2023-01-14 MED ORDER — SODIUM CHLORIDE 0.9 % IV SOLN
10.0000 mL/h | Freq: Once | INTRAVENOUS | Status: AC
  Administered 2023-01-14: 10 mL/h via INTRAVENOUS

## 2023-01-14 MED ORDER — ESCITALOPRAM OXALATE 10 MG PO TABS: 20.0000 mg | ORAL_TABLET | Freq: Every day | ORAL | Status: DC

## 2023-01-14 MED ORDER — AMIODARONE HCL IN DEXTROSE 360-4.14 MG/200ML-% IV SOLN
60.0000 mg/h | INTRAVENOUS | Status: DC
  Filled 2023-01-14: qty 200

## 2023-01-14 MED ORDER — ONDANSETRON HCL 4 MG PO TABS: 4.0000 mg | ORAL_TABLET | Freq: Four times a day (QID) | ORAL | Status: DC | PRN

## 2023-01-14 MED ORDER — CHLORHEXIDINE GLUCONATE CLOTH 2 % EX PADS
6.0000 | MEDICATED_PAD | Freq: Every day | CUTANEOUS | Status: DC
  Filled 2023-01-14: qty 6

## 2023-01-14 MED ORDER — INSULIN GLARGINE-YFGN 100 UNIT/ML ~~LOC~~ SOLN
20.0000 [IU] | Freq: Every day | SUBCUTANEOUS | Status: DC
  Filled 2023-01-14: qty 0.2

## 2023-01-14 MED ORDER — AMIODARONE LOAD VIA INFUSION
150.0000 mg | Freq: Once | INTRAVENOUS | Status: DC
  Filled 2023-01-14: qty 83.34

## 2023-01-14 MED ORDER — SUCCINYLCHOLINE CHLORIDE 200 MG/10ML IV SOSY
PREFILLED_SYRINGE | INTRAVENOUS | Status: AC
  Filled 2023-01-14: qty 10

## 2023-01-14 MED ORDER — LACTULOSE ENEMA
300.0000 mL | Freq: Once | ORAL | Status: AC
  Administered 2023-01-14: 300 mL via RECTAL
  Filled 2023-01-14: qty 300

## 2023-01-14 MED ORDER — ALBUTEROL SULFATE HFA 108 (90 BASE) MCG/ACT IN AERS: 2.0000 | INHALATION_SPRAY | RESPIRATORY_TRACT | Status: DC | PRN

## 2023-01-14 MED ORDER — LACTULOSE ENEMA
300.0000 mL | Freq: Two times a day (BID) | ORAL | Status: DC
  Filled 2023-01-14 (×2): qty 300

## 2023-01-14 MED ORDER — GABAPENTIN 300 MG PO CAPS: 600.0000 mg | ORAL_CAPSULE | Freq: Two times a day (BID) | ORAL | Status: DC

## 2023-01-14 MED ORDER — LORAZEPAM 2 MG/ML IJ SOLN
INTRAMUSCULAR | Status: AC
  Filled 2023-01-14: qty 1

## 2023-01-14 MED ORDER — ACETAMINOPHEN 325 MG PO TABS: 650.0000 mg | ORAL_TABLET | Freq: Four times a day (QID) | ORAL | Status: DC | PRN

## 2023-01-14 MED ORDER — DOPAMINE-DEXTROSE 3.2-5 MG/ML-% IV SOLN
0.0000 ug/min | INTRAVENOUS | Status: DC
  Administered 2023-01-14: 10 ug/kg/min via INTRAVENOUS

## 2023-01-14 MED ORDER — MAGNESIUM HYDROXIDE 400 MG/5ML PO SUSP: 30.0000 mL | Freq: Every day | ORAL | Status: DC | PRN

## 2023-01-14 MED ORDER — DICLOFENAC SODIUM 1 % EX GEL: 2.0000 g | Freq: Four times a day (QID) | CUTANEOUS | Status: DC | PRN

## 2023-01-14 MED ORDER — ALBUTEROL SULFATE (2.5 MG/3ML) 0.083% IN NEBU: 2.5000 mg | INHALATION_SOLUTION | RESPIRATORY_TRACT | Status: DC | PRN

## 2023-01-14 MED ORDER — ACETAMINOPHEN 325 MG RE SUPP: 650.0000 mg | Freq: Four times a day (QID) | RECTAL | Status: DC | PRN

## 2023-01-14 MED ORDER — VITAMIN K1 10 MG/ML IJ SOLN
10.0000 mg | Freq: Once | INTRAVENOUS | Status: AC
  Administered 2023-01-14: 10 mg via INTRAVENOUS
  Filled 2023-01-14: qty 1

## 2023-01-14 MED ORDER — FLUTICASONE PROPIONATE 50 MCG/ACT NA SUSP: 2.0000 | Freq: Every day | NASAL | Status: DC | PRN

## 2023-01-15 ENCOUNTER — Other Ambulatory Visit: Payer: Self-pay | Admitting: Family Medicine

## 2023-01-15 ENCOUNTER — Inpatient Hospital Stay: Payer: 59

## 2023-01-15 ENCOUNTER — Inpatient Hospital Stay: Payer: 59 | Admitting: Oncology

## 2023-01-15 DIAGNOSIS — J4489 Other specified chronic obstructive pulmonary disease: Secondary | ICD-10-CM

## 2023-01-15 LAB — TYPE AND SCREEN
ABO/RH(D): B POS
Antibody Screen: NEGATIVE
Unit division: 0

## 2023-01-15 LAB — BPAM RBC
Blood Product Expiration Date: 202403062359
ISSUE DATE / TIME: 202402080132
Unit Type and Rh: 7300

## 2023-01-15 LAB — PREPARE FRESH FROZEN PLASMA: Unit division: 0

## 2023-01-15 LAB — BPAM FFP
Blood Product Expiration Date: 202402132359
Blood Product Expiration Date: 202402132359
ISSUE DATE / TIME: 202402080537
ISSUE DATE / TIME: 202402080825
Unit Type and Rh: 1700
Unit Type and Rh: 1700

## 2023-01-15 LAB — PREPARE RBC (CROSSMATCH)

## 2023-01-22 ENCOUNTER — Ambulatory Visit: Payer: Medicare Other | Admitting: Family Medicine

## 2023-01-27 ENCOUNTER — Ambulatory Visit: Payer: Medicare Other | Admitting: Family Medicine

## 2023-02-04 ENCOUNTER — Ambulatory Visit: Payer: Medicare Other

## 2023-02-05 ENCOUNTER — Other Ambulatory Visit: Payer: Self-pay | Admitting: Family Medicine

## 2023-02-05 DIAGNOSIS — J4489 Other specified chronic obstructive pulmonary disease: Secondary | ICD-10-CM

## 2023-02-05 NOTE — Assessment & Plan Note (Signed)
-   The patient will be placed on supplemental coverage with NovoLog. - We will continue her basal coverage while monitoring for hypoglycemia. - We will continue Neurontin.

## 2023-02-05 NOTE — ED Notes (Signed)
Admit MD to bedside and updated MD Admit calling family, no intubation at this time. Verbal order for 2 of ativan at this time. Not given trying to verbalize order from MD. MD currently on phone with family

## 2023-02-05 NOTE — ED Provider Notes (Signed)
Department of Emergency Medicine CODE BLUE CONSULTATION    Code Blue CONSULT NOTE  Chief Complaint: Cardiac arrest/unresponsive   Level V Caveat: Unresponsive  History of present illness: Patient had been admitted to the hospital for GI bleed and weakness.  Apparently nursing staff was evaluating patient when they thought she might have been having a seizure because she stiffened up.  She was then found to be pulseless.  ROS: Unable to obtain, Level V caveat  Scheduled Meds:  amiodarone  150 mg Intravenous Once   amLODipine  2.5 mg Oral Daily   atorvastatin  40 mg Oral Daily   calcium carbonate  1 tablet Oral TID WC   Chlorhexidine Gluconate Cloth  6 each Topical Daily   escitalopram  20 mg Oral Daily   etomidate       fluticasone furoate-vilanterol  1 puff Inhalation Daily   gabapentin  600 mg Oral BID   insulin glargine-yfgn  20 Units Subcutaneous Q2200   lactulose  300 mL Rectal BID   LORazepam       sodium chloride flush  10-40 mL Intracatheter Q12H   zinc sulfate  220 mg Oral Daily   Continuous Infusions:  sodium chloride 100 mL/hr at 01/17/2023 0849   amiodarone     Followed by   amiodarone     DOPamine     pantoprazole 8 mg/hr (01-17-23 0014)   PRN Meds:.acetaminophen **OR** acetaminophen, albuterol, diclofenac Sodium, etomidate, fluticasone, LORazepam, magnesium hydroxide, melatonin, ondansetron **OR** ondansetron (ZOFRAN) IV, sodium chloride flush Past Medical History:  Diagnosis Date   Allergy    Asthma    Carpal tunnel syndrome on left    Cervical radiculitis    Chronic osteoarthritis    Corns and callosity    Depression    Dermatophytosis of foot    Diabetes mellitus without complication (HCC)    Gout    Hyperlipidemia    Hypertension    Impingement syndrome of left shoulder    Intrahepatic cholangiocarcinoma (HCC)    Lumbosacral neuritis    Microalbuminuria    Obesity    Supraventricular tachycardia 07/01/2015   SVT (supraventricular  tachycardia)    Tenosynovitis of wrist    Vitamin D deficiency    Past Surgical History:  Procedure Laterality Date   ABDOMINAL HYSTERECTOMY     ANTERIOR CERVICAL DECOMP/DISCECTOMY FUSION N/A 10/02/2022   Procedure: ANTERIOR CERVICAL DECOMPRESSION/DISCECTOMY FUSION 1 LEVEL;  Surgeon: Meade Maw, MD;  Location: [facility] ORS;  Service: Neurosurgery;  Laterality: N/A;  ACDF C4-5.   COLONOSCOPY WITH PROPOFOL N/A 05/06/2021   Procedure: COLONOSCOPY WITH PROPOFOL;  Surgeon: Lucilla Lame, MD;  Location: [facility] ENDOSCOPY;  Service: Endoscopy;  Laterality: N/A;   ESOPHAGOGASTRODUODENOSCOPY (EGD) WITH PROPOFOL N/A 05/06/2021   Procedure: ESOPHAGOGASTRODUODENOSCOPY (EGD) WITH PROPOFOL;  Surgeon: Lucilla Lame, MD;  Location: [facility] ENDOSCOPY;  Service: Endoscopy;  Laterality: N/A;   PORTA CATH INSERTION N/A 07/17/2021   Procedure: PORTA CATH INSERTION;  Surgeon: Algernon Huxley, MD;  Location: [place] [facility];  Service: Cardiovascular;  Laterality: N/A;   Social History   Socioeconomic History   Marital status: Single    Spouse name: Not on file   Number of children: 2   Years of education: Not on file   Highest education level: Not on file  Occupational History   Occupation: disable     Comment: from chronic back pain, 2019  Tobacco Use   Smoking status: Some Days    Types: Cigarettes   Smokeless tobacco: Never  Tobacco comments:    1 per day 01/30/21 does not inhale it  Vaping Use   Vaping Use: Never used  Substance and Sexual Activity   Alcohol use: Not Currently   Drug use: Not Currently    Types: Benzodiazepines   Sexual activity: Not Currently  Other Topics Concern   Not on file  Social History Narrative   She is on disability for chronic back pain. Pt's son lives with her   Social Determinants of Health   Financial Resource Strain: High Risk (11/23/2022)   Overall Financial Resource Strain (CARDIA)    Difficulty of Paying Living Expenses: Hard  Food Insecurity: Food  Insecurity Present (11/23/2022)   Hunger Vital Sign    Worried About Running Out of Food in the Last Year: Sometimes true    Ran Out of Food in the Last Year: Sometimes true  Transportation Needs: Unmet Transportation Needs (12/15/2022)   PRAPARE - Hydrologist (Medical): Yes    Lack of Transportation (Non-Medical): Yes  Physical Activity: Inactive (02/03/2022)   Exercise Vital Sign    Days of Exercise per Week: 0 days    Minutes of Exercise per Session: 0 min  Stress: Stress Concern Present (11/23/2022)   [place]    Feeling of Stress : To some extent  Social Connections: Moderately Isolated (02/03/2022)   Social Connection and Isolation Panel [NHANES]    Frequency of Communication with Friends and Family: More than three times a week    Frequency of Social Gatherings with Friends and Family: Three times a week    Attends Religious Services: More than 4 times per year    Active Member of Clubs or Organizations: No    Attends Archivist Meetings: Never    Marital Status: Never married  Intimate Partner Violence: Not At Risk (02/03/2022)   Humiliation, Afraid, Rape, and Kick questionnaire    Fear of Current or Ex-Partner: No    Emotionally Abused: No    Physically Abused: No    Sexually Abused: No   Allergies  Allergen Reactions   Other Shortness Of Breath    Cat dander - shortness of breath Grass pollen - shortness of breath   Platinol [Cisplatin] Itching   Bydureon [Exenatide] Other (See Comments)    Too painful.    Last set of Vital Signs (not current) Vitals:   01/20/2023 1158 01/20/23 1159  BP:    Pulse:    Resp: (!) 0 (!) 0  Temp: 99.7 F (37.6 C) 99.7 F (37.6 C)  SpO2:        Physical Exam  Gen: unresponsive Cardiovascular: pulseless  Resp: apneic.  Abd: nondistended. Rectal tube in place with black appearing stool Neuro: GCS 3, unresponsive to pain   HEENT: No blood in posterior pharynx Neck: No crepitus  Musculoskeletal: No deformity  Skin: warm  Procedures  INTUBATION Performed by: Nance Pear Required items: required blood products, implants, devices, and special equipment available Patient identity confirmed: provided demographic data and hospital-assigned identification number Time out: Immediately prior to procedure a "time out" was called to verify the correct patient, procedure, equipment, support staff and site/side marked as required. Indications: Cardiopulmonary arrest Intubation method: Glidescope Preoxygenation: BVM Sedatives: Etomidate Paralytic: Rocuronium Tube Size: 7.5 cuffed Post-procedure assessment: chest rise and ETCO2 monitor Tube secured by Respiratory Therapy Patient tolerated the procedure well with no immediate complications.  CRITICAL CARE Performed by: Nance Pear Total critical care  time: 15 Critical care time was exclusive of separately billable procedures and treating other patients. Critical care was necessary to treat or prevent imminent or life-threatening deterioration. Critical care was time spent personally by me on the following activities: development of treatment plan with patient and/or surrogate as well as nursing, discussions with consultants, evaluation of patient's response to treatment, examination of patient, obtaining history from patient or surrogate, ordering and performing treatments and interventions, ordering and review of laboratory studies, ordering and review of radiographic studies, pulse oximetry and re-evaluation of patient's condition.  Cardiopulmonary Resuscitation (CPR) Procedure Note  Directed/Performed by: Nance Pear I personally directed ancillary staff and/or performed CPR in an effort to regain return of spontaneous circulation and to maintain cardiac, neuro and systemic perfusion.    Medical Decision making  Patient was found to be pulseless by  nursing staff.  Upon arrival to the patient's room I did run CPR.  I ran this until hospitalist arrived to take over care of the patient.  I was then requested by hospitalist to intubate the patient.     Nance Pear, MD 02-11-2023 1351

## 2023-02-05 NOTE — ED Notes (Signed)
Pt being intubated at this time.

## 2023-02-05 NOTE — ED Notes (Signed)
Pulse check pulse present. Ready to intubate per Admit MD

## 2023-02-05 NOTE — ED Notes (Signed)
30 etomidate given at this time

## 2023-02-05 NOTE — Progress Notes (Signed)
   01/23/2023 1315  Spiritual Encounters  Type of Visit Initial  Conversation partners present during encounter Nurse;Physician  Referral source Code page  Reason for visit Code  OnCall Visit Yes  Spiritual Framework  Presenting Themes Significant life change   Chaplain responded to code blue page. No family present. Chaplain services are available for follow up as needed.

## 2023-02-05 NOTE — ED Notes (Signed)
MD Archie Balboa at head of bed for intubation.

## 2023-02-05 NOTE — ED Notes (Signed)
Pulse checked and pulse present at this time.

## 2023-02-05 NOTE — ED Notes (Signed)
Pt has witnessed arrest with RN present code paged out

## 2023-02-05 NOTE — ED Notes (Signed)
Chaplain present. Dr Ouida Sills talking with with family.

## 2023-02-05 NOTE — ED Notes (Signed)
Pt being paced to 100 at this time.

## 2023-02-05 NOTE — ED Notes (Signed)
Pulse check at this time. No pulse resume cpr

## 2023-02-05 NOTE — Assessment & Plan Note (Signed)
-   The patient will be admitted to a progressive unit bed. - She was typed and crossmatched will be transfused 1 unit packed red blood cells. - We will follow posttransfusion H&H. - We will follow serial H&H. - We will continue her IV Protonix drip. - GI consult will be obtained. - I notified Dr. Alice Reichert about the patient.

## 2023-02-05 NOTE — Assessment & Plan Note (Signed)
-   We will continue her antihypertensives. - We will place on as needed IV labetalol.

## 2023-02-05 NOTE — ED Notes (Signed)
Pt up to 120 on pacing

## 2023-02-05 NOTE — Assessment & Plan Note (Signed)
Will give 10 mg of IV Vitamin K and 2 units of FFP.

## 2023-02-05 NOTE — ED Notes (Signed)
1 epi given

## 2023-02-05 NOTE — Assessment & Plan Note (Signed)
-   The patient was ordered a lactulose enema. - With improved mental status will utilize p.o. lactulose. - We will follow ammonia level.

## 2023-02-05 NOTE — Progress Notes (Signed)
   02/07/2023 1200  Spiritual Encounters  Type of Visit Initial  Care provided to: Patient  Conversation partners present during encounter Nurse  Referral source Code page  Reason for visit Code  OnCall Visit No  Spiritual Framework  Presenting Themes Significant life change  Interventions  Spiritual Care Interventions Made Other (comment)   Chap responded to code blue overhead speakers call. Pt in the room with inter team doin cpr. No family present at this time. Call [place] as need arise.

## 2023-02-05 NOTE — Plan of Care (Signed)
PATIENT NAME: Janice Short RECORD NUMBER: [id number] Birthday: 1961-06-15  Age: 62 y.o. Admit Date: 01/19/2023  Provider: Gayland Curry  Indication: ACAUTE CARDIAC ARREST  Technical Description:  CPR performance duration: 35 mins Was defibrillation or cardioversion used ? NO Was external pacer placed ? YES Was patient intubated pre/post CPR ? YES Was transvenous pacer placed ? NO  Medications Administered Include      Yes/no Amiodarone   Atropine 1  Calcium   Epinephrine 4  Lidocaine   Magnesium   Norepinephrine   Phenylephrine   Sodium bicarbonate   Vasopression/Dopamine 10mg    Evaluation  PATIENT WITH END STAGE CHOLANGIOCARCINOMA WITH ACUTE AND SUDDEN CARDIAC DEATH, PATIENT CODED MULTIPLE TIMES WITH FINAL RHYTHM CONFIRMED AS ASYSTOLE DOPPLERS AND [place] WERE USED TO ASSESS CARDIAC STATUS AND FIND PULSES, NO CARDIAC SOUNDS OR PULSES AUSCULTATED OR FELT.  PATIENT WITH SIGNS OF SEVERE BRAIN DAMAGE AND NO SIGNS OF SPONTANEOUS RESPIRATIONS  DR Ouida Sills AT BEDSIDE  TIME OF DEATH TOD called at 11:58     Corrin Parker, M.D.  [facility]  Medical Director [place] Director [facility] Cardio-Pulmonary Department

## 2023-02-05 NOTE — Assessment & Plan Note (Signed)
-   This is likely the culprit for #1 and #2 given associated liver cirrhosis. - She is followed by oncology and is on chemotherapy being followed by Dr. Janese Banks.

## 2023-02-05 NOTE — ED Notes (Addendum)
Cpr continued

## 2023-02-05 NOTE — ED Notes (Signed)
No pulses present TOD called at 11:58 by MD Mortimer Fries

## 2023-02-05 NOTE — ED Notes (Signed)
Cpr continued

## 2023-02-05 NOTE — Assessment & Plan Note (Signed)
-   This is likely prerenal. - She will be hydrated with IV normal saline and will follow BMP. - We will avoid nephrotoxins.

## 2023-02-05 NOTE — ED Notes (Signed)
[place] at bedside MD attempting to take look at heart

## 2023-02-05 NOTE — ED Notes (Addendum)
Dr Ouida Sills contacted about patient's decreasing blood pressure. RN informed new medication orders would be placed.

## 2023-02-05 NOTE — ED Notes (Signed)
Pt arrested. Pt given 1 epi at this time.

## 2023-02-05 NOTE — ED Notes (Signed)
Faint pulse cpr resumed and lucas started.

## 2023-02-05 NOTE — ED Notes (Signed)
No pulse at this time per MD

## 2023-02-05 NOTE — ED Notes (Signed)
Cpr going MD at bedside, pads to be placed at this time.

## 2023-02-05 NOTE — ED Notes (Signed)
Attempting to pace at this time. At 120 at this time.

## 2023-02-05 NOTE — ED Notes (Signed)
Dr Ouida Sills contacted about patient's increased heart rate. See orders

## 2023-02-05 NOTE — H&P (Addendum)
[place]   PATIENT NAME: Janice Short    MR#:  [id number]  DATE OF BIRTH:  1961-03-03  DATE OF ADMISSION:  01/30/2023  PRIMARY CARE PHYSICIAN: Steele Sizer, MD   Patient is coming from: Home  REQUESTING/REFERRING PHYSICIAN: Lurline Hare, MD  CHIEF COMPLAINT:   Chief Complaint  Patient presents with   GI Problem    GI bleed    HISTORY OF PRESENT ILLNESS:  Janice Short is a 62 y.o. 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  Patient was moaning and disoriented in the ER.  History was therefore limited.  She had no reported nausea or vomiting.  No reported fever or chills.  No reported dysuria, oliguria or hematuria or flank pain.  No reported chest pain or palpitations or cough or wheezing.  ED Course: When she came to the ER BP was 158/85 with respiratory rate of 22 and otherwise normal vital signs.  Labs revealed a significantly elevated ammonia level of 231, CO2 of 16 and a BUN of 34 with a creatinine of 3.07 up from 1.18 on 11/10/2022, alk phos 321 compared to 359 and albumin less than 1.5 compared to 1.8 and AST of 100 up from 51-.  Total bili was 4.3 compared to 3.4 then.  High sensitive troponin I was 27.  Lactic acid was 5.4.  CBC showed anemia with hemoglobin 7.2 hematocrit 22.4 compared to 9.1 and 29.4 on 11/10/2022 with platelets of 84 compared to 95 then.  INR was 3.3 and PTT 33.6 with PTT of 89. EKG as reviewed by me : Sinus rhythm with a rate of 83 with low voltage QRS with borderline prolonged QT interval. Imaging: Noncontrasted head CT scan revealed nonspecific low-attenuation changes in the subcortical white matter with no acute intracranial hemorrhage or mass effect.  Portable chest  x-ray showed no acute cardiopulmonary disease.  The patient was typed and crossmatched and will be transfused 1 unit of packed red blood cells.  She was given 80 mg of IV Protonix bolus followed by drip and 1 L bolus of IV normal saline.  For elevated ammonia she was given lactulose 300 now rectal enema given her altered mental status. PAST MEDICAL HISTORY:   Past Medical History:  Diagnosis Date   Allergy    Asthma    Carpal tunnel syndrome on left    Cervical radiculitis    Chronic osteoarthritis    Corns and callosity    Depression    Dermatophytosis of foot    Diabetes mellitus without complication (HCC)    Gout    Hyperlipidemia    Hypertension    Impingement syndrome of left shoulder    Intrahepatic cholangiocarcinoma (HCC)    Lumbosacral neuritis    Microalbuminuria    Obesity    Supraventricular tachycardia 07/01/2015   SVT (supraventricular tachycardia)    Tenosynovitis of wrist    Vitamin D deficiency   -Intrahepatic cholangiocarcinoma seen by oncology.  PAST SURGICAL HISTORY:   Past Surgical History:  Procedure Laterality Date   ABDOMINAL HYSTERECTOMY     ANTERIOR CERVICAL DECOMP/DISCECTOMY FUSION N/A 10/02/2022   Procedure: ANTERIOR CERVICAL DECOMPRESSION/DISCECTOMY FUSION 1 LEVEL;  Surgeon: Meade Maw, MD;  Location: [facility] ORS;  Service: Neurosurgery;  Laterality: N/A;  ACDF C4-5.   COLONOSCOPY WITH  PROPOFOL N/A 05/06/2021   Procedure: COLONOSCOPY WITH PROPOFOL;  Surgeon: Lucilla Lame, MD;  Location: [facility] ENDOSCOPY;  Service: Endoscopy;  Laterality: N/A;   ESOPHAGOGASTRODUODENOSCOPY (EGD) WITH PROPOFOL N/A 05/06/2021   Procedure: ESOPHAGOGASTRODUODENOSCOPY (EGD) WITH PROPOFOL;  Surgeon: Lucilla Lame, MD;  Location: [facility] ENDOSCOPY;  Service: Endoscopy;  Laterality: N/A;   PORTA CATH INSERTION N/A 07/17/2021   Procedure: PORTA CATH INSERTION;  Surgeon: Algernon Huxley, MD;  Location: [facility];  Service: Cardiovascular;  Laterality: N/A;    SOCIAL  HISTORY:   Social History   Tobacco Use   Smoking status: Some Days    Types: Cigarettes   Smokeless tobacco: Never   Tobacco comments:    1 per day 01/30/21 does not inhale it  Substance Use Topics   Alcohol use: Not Currently    FAMILY HISTORY:   Family History  Problem Relation Age of Onset   Heart attack Mother    CAD Mother    Kidney disease Father     DRUG ALLERGIES:   Allergies  Allergen Reactions   Other Shortness Of Breath    Cat dander - shortness of breath Grass pollen - shortness of breath   Platinol [Cisplatin] Itching   Bydureon [Exenatide] Other (See Comments)    Too painful.    REVIEW OF SYSTEMS:   ROS As per history of present illness otherwise unobtainable due to the pension's encephalopathy.   MEDICATIONS AT HOME:   Prior to Admission medications   Medication Sig Start Date End Date Taking? Authorizing Provider  albuterol (VENTOLIN HFA) 108 (90 Base) MCG/ACT inhaler INHALE TWO PUFFS BY MOUTH EVERY 6 HOURS AS NEEDED FOR SHORTNESS OF BREATH OR WHEEZING 01/05/23   Ancil Boozer, Drue Stager, MD  amLODipine (NORVASC) 2.5 MG tablet Take 1 tablet (2.5 mg total) by mouth daily. 10/27/22   Love, Ivan Anchors, PA-C  amoxicillin (AMOXIL) 500 MG capsule Take 1 capsule (500 mg total) by mouth 2 (two) times daily. 10/28/22   Lovorn, Jinny Blossom, MD  atorvastatin (LIPITOR) 40 MG tablet Take 1 tablet (40 mg total) by mouth daily. 09/25/22   Steele Sizer, MD  calcium carbonate (OSCAL) 1500 (600 Ca) MG TABS tablet Take 1 tablet (1,500 mg total) by mouth 3 (three) times daily with meals. 10/28/22   Love, Ivan Anchors, PA-C  diclofenac Sodium (VOLTAREN) 1 % GEL Apply 2 g topically 4 (four) times daily. 10/27/22   Love, Ivan Anchors, PA-C  escitalopram (LEXAPRO) 20 MG tablet TAKE ONE TABLET BY MOUTH DAILY AT 9AM 09/25/22   Steele Sizer, MD  fluticasone (FLONASE) 50 MCG/ACT nasal spray Place 2 sprays into both nostrils daily as needed. USE 2 SPRAY(S) IN EACH NOSTRIL AT BEDTIME 07/06/22    Sowles, Drue Stager, MD  Fluticasone-Umeclidin-Vilant (TRELEGY ELLIPTA) 100-62.5-25 MCG/ACT AEPB Inhale 1 puff into the lungs daily. 09/25/22   Steele Sizer, MD  gabapentin (NEURONTIN) 300 MG capsule TAKE TWO CAPSULES BY MOUTH TWICE DAILY @ 9AM & 5PM 09/25/22   Steele Sizer, MD  hydrocortisone cream 1 % Apply topically 2 (two) times daily. 10/27/22   Love, Ivan Anchors, PA-C  Insulin Pen Needle 32G X 4 MM MISC Use at bedtime with insulin 10/28/22   Love, Ivan Anchors, PA-C  lidocaine-prilocaine (EMLA) cream APPLY ONE APPLICATION TOPICALLY AS NEEDED APPLY TO PORT AND COVER WITH SARAN WRAP 1 TO 2 HOURS PRIOR TO PORT ACCESS (BULK) 12/16/22   Sindy Guadeloupe, MD  ondansetron (ZOFRAN) 4 MG tablet Take 1 tablet (4 mg total) by mouth every 6 (six)  hours as needed for nausea. 10/27/22   Love, Ivan Anchors, PA-C  senna-docusate (SENOKOT-S) 8.6-50 MG tablet Take 1 tablet by mouth daily. 10/27/22   Love, Ivan Anchors, PA-C  TRESIBA FLEXTOUCH 100 UNIT/ML FlexTouch Pen INJECT 20 UNITS SUBCUTANEOUSLY DAILY (BULK) 01/06/23   Steele Sizer, MD  witch hazel-glycerin (TUCKS) pad Apply topically as needed for itching. 10/27/22   Love, Ivan Anchors, PA-C  Zinc Sulfate 220 (50 Zn) MG TABS Take 1 tablet (220 mg total) by mouth daily. 10/27/22   Love, Ivan Anchors, PA-C      VITAL SIGNS:  Blood pressure 116/62, pulse 80, temperature (!) 92.7 F (33.7 C), resp. rate 12, SpO2 100 %.  PHYSICAL EXAMINATION:  Physical Exam  GENERAL:  62 y.o.-year-old patient lying in the bed with no acute distress.  She was very somnolent and difficult to arouse. EYES: Pupils equal, round, reactive to light and accommodation.  Positive scleral icterus.  Positive pallor.  Extraocular muscles intact.  HEENT: Head atraumatic, normocephalic. Oropharynx and nasopharynx clear.  NECK:  Supple, no jugular venous distention. No thyroid enlargement, no tenderness.  LUNGS: Normal breath sounds bilaterally, no wheezing, rales,rhonchi or crepitation. No use of accessory  muscles of respiration.  CARDIOVASCULAR: Regular rate and rhythm, S1, S2 normal. No murmurs, rubs, or gallops.  ABDOMEN: Soft, nondistended, nontender. Bowel sounds present. No organomegaly or mass.  EXTREMITIES: No pedal edema, cyanosis, or clubbing.  NEUROLOGIC: Grossly nonfocal. PSYCHIATRIC: The patient is somnolent and difficult to arouse. SKIN: No obvious rash, lesion, or ulcer.   LABORATORY PANEL:   CBC Recent Labs  Lab 01/08/2023 2304  WBC 9.3  HGB 7.2*  HCT 22.4*  PLT 84*   ------------------------------------------------------------------------------------------------------------------  Chemistries  Recent Labs  Lab 01/29/2023 2304  NA 136  K 4.2  CL 109  CO2 16*  GLUCOSE 85  BUN 34*  CREATININE 3.07*  CALCIUM 6.9*  AST 100*  ALT 31  ALKPHOS 321*  BILITOT 4.3*   ------------------------------------------------------------------------------------------------------------------  Cardiac Enzymes No results for input(s): "TROPONINI" in the last 168 hours. ------------------------------------------------------------------------------------------------------------------  RADIOLOGY:  CT Head Wo Contrast  Result Date: February 13, 2023 CLINICAL DATA:  Mental status change of unknown cause. Gastrointestinal bleeding is ongoing. EXAM: CT HEAD WITHOUT CONTRAST TECHNIQUE: Contiguous axial images were obtained from the base of the skull through the vertex without intravenous contrast. RADIATION DOSE REDUCTION: This exam was performed according to the departmental dose-optimization program which includes automated exposure control, adjustment of the mA and/or kV according to patient size and/or use of iterative reconstruction technique. COMPARISON:  10/01/2022 FINDINGS: Brain: Patchy subcortical low-attenuation changes are demonstrated bilaterally. Changes are more prominent than on the prior study. Probably this represents small vessel ischemic changes but early infarcts are not  excluded. Consider MRI for further evaluation. No mass-effect or midline shift. No abnormal extra-axial fluid collections. Gray-white matter junctions are distinct. Basal cisterns are not effaced. No acute intracranial hemorrhage. Vascular: No hyperdense vessel or unexpected calcification. Skull: Normal. Negative for fracture or focal lesion. Sinuses/Orbits: No acute finding. Other: None. IMPRESSION: Nonspecific low-attenuation changes in the subcortical white matter. Consider MRI to exclude acute ischemic changes. No acute intracranial hemorrhage or mass effect. Electronically Signed   By: Lucienne Capers M.D.   On: 13-Feb-2023 00:00   DG Chest Port 1 View  Result Date: 01/21/2023 CLINICAL DATA:  GI bleed. EXAM: PORTABLE CHEST 1 VIEW COMPARISON:  Chest radiograph dated 10/12/2022. FINDINGS: Right-sided Port-A-Cath with tip in the region of the cavoatrial junction. No focal consolidation, pleural effusion, or pneumothorax.  Stable cardiac silhouette. Atherosclerotic calcification of the aorta. No acute osseous pathology. IMPRESSION: No active disease. Electronically Signed   By: Anner Crete M.D.   On: 02/01/2023 23:25      IMPRESSION AND PLAN:  Assessment and Plan: * Upper GI bleeding - The patient will be admitted to a progressive unit bed. - She was typed and crossmatched will be transfused 1 unit packed red blood cells. - We will follow posttransfusion H&H. - We will follow serial H&H. - We will continue her IV Protonix drip. - GI consult will be obtained. - I notified Dr. Alice Reichert about the patient.  Acute hepatic encephalopathy ([place]) - The patient was ordered a lactulose enema. - With improved mental status will utilize p.o. lactulose. - We will follow ammonia level.  Acute kidney injury superimposed on chronic kidney disease ([place]) - This is likely prerenal. - She will be hydrated with IV normal saline and will follow BMP. - We will avoid nephrotoxins.  Intrahepatic  cholangiocarcinoma ([place]) - This is likely the culprit for #1 and #2 given associated liver cirrhosis. - She is followed by oncology and is on chemotherapy being followed by Dr. Janese Banks.  Coagulopathy ([place]) Will give 10 mg of IV Vitamin K and 2 units of FFP.  Type 2 diabetes mellitus with peripheral neuropathy (HCC) - The patient will be placed on supplemental coverage with NovoLog. - We will continue her basal coverage while monitoring for hypoglycemia. - We will continue Neurontin.  Essential hypertension - We will continue her antihypertensives. - We will place on as needed IV labetalol.  Dyslipidemia - We will hold off statin for now given elevated LFT's.       DVT prophylaxis: SCDs.  Medical prophylaxis currently contraindicated due to GI bleeding. Advanced Care Planning:  Code Status: full code. Family Communication:  The plan of care was discussed in details with the patient (and family). I answered all questions. The patient agreed to proceed with the above mentioned plan. Further management will depend upon hospital course. Disposition Plan: Back to previous home environment Consults called: GI  All the records are reviewed and case discussed with ED provider.  Status is: Inpatient   At the time of the admission, it appears that the appropriate admission status for this patient is inpatient.  This is judged to be reasonable and necessary in order to provide the required intensity of service to ensure the patient's safety given the presenting symptoms, physical exam findings and initial radiographic and laboratory data in the context of comorbid conditions.  The patient requires inpatient status due to high intensity of service, high risk of further deterioration and high frequency of surveillance required.  I certify that at the time of admission, it is my clinical judgment that the patient will require inpatient hospital care extending more than 2 midnights.                             Dispo: The patient is from: Home              Anticipated d/c is to: Home              Patient currently is not medically stable to d/c.              Difficult to place patient: No  Time spent: 55 minutes.  Christel Mormon M.D on 02/06/2023 at 2:52 AM  [facility]   From 7 PM-7 AM,  contact night-coverage [email or web address]  CC: Primary care physician; Steele Sizer, MD

## 2023-02-05 NOTE — ED Notes (Signed)
RT bagging pt at this time.

## 2023-02-05 NOTE — ED Notes (Addendum)
This nurse spoke to patient's daughter: Jametta Moorehead who gave permission for patient Janice Short to receive a blood transfusion. Eleanora Neighbor, RN, also verified that [name] gives consent for her mother [name] to receive a blood transfusion at this time.

## 2023-02-05 NOTE — Death Summary Note (Signed)
DEATH SUMMARY   Patient Details  Name: Janice Short MRN: [id number] DOB: 06-29-1961 RCB:ULAGTX, Drue Stager, MD Admission/Discharge Information   Admit Date:  31-Jan-2023  Date of Death: Date of Death: 01-Feb-2023  Time of Death:  Mar 03, 1157  Length of Stay: 0   Principle Cause of death: cardiac arrest  Hospital Diagnoses: Principal Problem:   Upper GI bleeding Active Problems:   Acute hepatic encephalopathy (HCC)   Intrahepatic cholangiocarcinoma (HCC)   Acute kidney injury superimposed on chronic kidney disease (HCC)   Type 2 diabetes mellitus with peripheral neuropathy (HCC)   Coagulopathy (HCC)   Essential hypertension   Dyslipidemia   UGIB (upper gastrointestinal bleed)  Hospital Course: Presented this morning due to GI bleed in relation to known metastatic cholangiocarcinoma. Was in critical condition including encephalopathy and severe anemia from blood loss. GI evaluated and did not recommend procedure at this time. Oncology also evaluated and did not recommend further treatments inpatient at this time.  She was receiving supportive care including 1 unit pRBCs, 2 units FFP and IVF. Her HR increased to 130s sinus and BP began lowering. IV fluid rate was increased and amiodarone gtt was ordered. As nurse was hanging the medication, patient was observed to have seizure like activity and witnessed desaturation with asystole. Code blue was called.  CPR conducted for approximately 30 minutes prior to completion due to no ROSC or spontaneous breathing. Unable to locate pulse with doppler. Total of 5 doses of epi, 1 of atropine given. Brief transient weak pulses were palpated at pulse checks at rate around 30 with attempt to externally pace but unsuccessful.  Intubation performed successfully by EDP. CCM providers at bedside who pronounced death.   Procedures: intubation, 1 unit pRBC transfusion, 2 units FFP  Consultations: CCM, GI, oncology   The results of significant diagnostics from this  hospitalization (including imaging, microbiology, ancillary and laboratory) are listed below for reference.   Significant Diagnostic Studies: CT Head Wo Contrast  Result Date: 02/01/2023 CLINICAL DATA:  Mental status change of unknown cause. Gastrointestinal bleeding is ongoing. EXAM: CT HEAD WITHOUT CONTRAST TECHNIQUE: Contiguous axial images were obtained from the base of the skull through the vertex without intravenous contrast. RADIATION DOSE REDUCTION: This exam was performed according to the departmental dose-optimization program which includes automated exposure control, adjustment of the mA and/or kV according to patient size and/or use of iterative reconstruction technique. COMPARISON:  10/01/2022 FINDINGS: Brain: Patchy subcortical low-attenuation changes are demonstrated bilaterally. Changes are more prominent than on the prior study. Probably this represents small vessel ischemic changes but early infarcts are not excluded. Consider MRI for further evaluation. No mass-effect or midline shift. No abnormal extra-axial fluid collections. Gray-white matter junctions are distinct. Basal cisterns are not effaced. No acute intracranial hemorrhage. Vascular: No hyperdense vessel or unexpected calcification. Skull: Normal. Negative for fracture or focal lesion. Sinuses/Orbits: No acute finding. Other: None. IMPRESSION: Nonspecific low-attenuation changes in the subcortical white matter. Consider MRI to exclude acute ischemic changes. No acute intracranial hemorrhage or mass effect. Electronically Signed   By: Lucienne Capers M.D.   On: Feb 01, 2023 00:00   DG Chest Port 1 View  Result Date: 01/31/2023 CLINICAL DATA:  GI bleed. EXAM: PORTABLE CHEST 1 VIEW COMPARISON:  Chest radiograph dated 10/12/2022. FINDINGS: Right-sided Port-A-Cath with tip in the region of the cavoatrial junction. No focal consolidation, pleural effusion, or pneumothorax. Stable cardiac silhouette. Atherosclerotic calcification of the  aorta. No acute osseous pathology. IMPRESSION: No active disease. Electronically Signed   By:  Anner Crete M.D.   On: 01/22/2023 23:25   DG Cervical Spine 2 or 3 views  Result Date: 12/25/2022 CLINICAL DATA:  Post cervical fusion on October 02, 2022.  No pain. EXAM: CERVICAL SPINE - 2-3 VIEW COMPARISON:  11/17/2022 FINDINGS: Postoperative changes with anterior plate and screw fixation and intervertebral fusion hardware at C4-5. Fused segments appear intact without change in position. Early bone fusion is suggested. There is an acute appearing nondisplaced fracture of the anterior inferior endplate of C2, unchanged since prior study. Probably also a fracture of the odontoid process at the tip. Diffuse degenerative change throughout the cervical spine with narrowed interspaces and endplate osteophyte formation throughout. Degenerative changes in the posterior facet joints. Right central venous catheter is incompletely visualized. Multiple prior tooth extractions with dental caries present. IMPRESSION: 1. Postoperative anterior plate and screw fixation and intervertebral fusion at C4-5. Alignment is unchanged. 2. Acute nondisplaced fractures of the odontoid process in the anterior inferior endplate of C2, unchanged. 3. Diffuse degenerative change. Electronically Signed   By: Lucienne Capers M.D.   On: 12/25/2022 21:40    Microbiology: Recent Results (from the past 240 hour(s))  Resp panel by RT-PCR (RSV, Flu A&B, Covid) Anterior Nasal Swab     Status: None   Collection Time: 01/27/2023 11:41 PM   Specimen: Anterior Nasal Swab  Result Value Ref Range Status   SARS Coronavirus 2 by RT PCR NEGATIVE NEGATIVE Final    Comment: (NOTE) SARS-CoV-2 target nucleic acids are NOT DETECTED.  The SARS-CoV-2 RNA is generally detectable in upper respiratory specimens during the acute phase of infection. The lowest concentration of SARS-CoV-2 viral copies this assay can detect is 138 copies/mL. A negative result  does not preclude SARS-Cov-2 infection and should not be used as the sole basis for treatment or other patient management decisions. A negative result may occur with  improper specimen collection/handling, submission of specimen other than nasopharyngeal swab, presence of viral mutation(s) within the areas targeted by this assay, and inadequate number of viral copies(<138 copies/mL). A negative result must be combined with clinical observations, patient history, and epidemiological information. The expected result is Negative.  Fact Sheet for Patients:  EntrepreneurPulse.com.au  Fact Sheet for Healthcare Providers:  IncredibleEmployment.be  This test is no t yet approved or cleared by the [place] FDA and  has been authorized for detection and/or diagnosis of SARS-CoV-2 by FDA under an Emergency Use Authorization (EUA). This EUA will remain  in effect (meaning this test can be used) for the duration of the COVID-19 declaration under Section 564(b)(1) of the Act, 21 U.S.C.section 360bbb-3(b)(1), unless the authorization is terminated  or revoked sooner.       Influenza A by PCR NEGATIVE NEGATIVE Final   Influenza B by PCR NEGATIVE NEGATIVE Final    Comment: (NOTE) The Xpert Xpress SARS-CoV-2/FLU/RSV plus assay is intended as an aid in the diagnosis of influenza from Nasopharyngeal swab specimens and should not be used as a sole basis for treatment. Nasal washings and aspirates are unacceptable for Xpert Xpress SARS-CoV-2/FLU/RSV testing.  Fact Sheet for Patients: EntrepreneurPulse.com.au  Fact Sheet for Healthcare Providers: IncredibleEmployment.be  This test is not yet approved or cleared by the [place] FDA and has been authorized for detection and/or diagnosis of SARS-CoV-2 by FDA under an Emergency Use Authorization (EUA). This EUA will remain in effect (meaning this test can be used) for  the duration of the COVID-19 declaration under Section 564(b)(1) of the Act, 21 U.S.C. section 360bbb-3(b)(1),  unless the authorization is terminated or revoked.     Resp Syncytial Virus by PCR NEGATIVE NEGATIVE Final    Comment: (NOTE) Fact Sheet for Patients: EntrepreneurPulse.com.au  Fact Sheet for Healthcare Providers: IncredibleEmployment.be  This test is not yet approved or cleared by the [place] FDA and has been authorized for detection and/or diagnosis of SARS-CoV-2 by FDA under an Emergency Use Authorization (EUA). This EUA will remain in effect (meaning this test can be used) for the duration of the COVID-19 declaration under Section 564(b)(1) of the Act, 21 U.S.C. section 360bbb-3(b)(1), unless the authorization is terminated or revoked.  Performed at [facility], [address], [place], [place] [address]     Time spent: >75 minutes  Signed: Richarda Osmond, MD

## 2023-02-05 NOTE — ED Notes (Addendum)
Attempted to call son Salvadore Farber to obtain consent for blood transfusion and was unable to reach him or to leave a message.

## 2023-02-05 NOTE — Assessment & Plan Note (Signed)
-   We will hold off statin for now given elevated LFT's.

## 2023-02-05 NOTE — Progress Notes (Incomplete)
PROGRESS NOTE  Janice Short    DOB: [birth year], 62 y.o.  YBO:[id number]    Code Status: Full Code   DOA: 01/26/2023   LOS: 0   Brief hospital course  Janice Short is a 62 y.o. female with a PMH significant for ***.  They presented from *** to the ED on 01/08/2023 with *** x *** days. ***  In the ED, it was found that they had ***.  Significant findings included ***.  They were initially treated with ***.   Patient was admitted to medicine service for further workup and management of *** as outlined in detail below.  January 29, 2023 -***  Assessment & Plan  Principal Problem:   Upper GI bleeding Active Problems:   Acute hepatic encephalopathy (HCC)   Intrahepatic cholangiocarcinoma (HCC)   Acute kidney injury superimposed on chronic kidney disease (HCC)   Type 2 diabetes mellitus with peripheral neuropathy (HCC)   Coagulopathy (HCC)   Essential hypertension   Dyslipidemia   UGIB (upper gastrointestinal bleed)  Upper GI bleeding - The patient will be admitted to a progressive unit bed. - She was typed and crossmatched will be transfused 1 unit packed red blood cells. - We will follow posttransfusion H&H. - We will follow serial H&H. - We will continue her IV Protonix drip. - GI consult will be obtained. - I notified Dr. Alice Reichert about the patient.   Acute hepatic encephalopathy ([place]) - The patient was ordered a lactulose enema. - With improved mental status will utilize p.o. lactulose. - We will follow ammonia level.   Acute kidney injury superimposed on chronic kidney disease ([place]) - This is likely prerenal. - She will be hydrated with IV normal saline and will follow BMP. - We will avoid nephrotoxins.   Intrahepatic cholangiocarcinoma ([place]) - This is likely the culprit for #1 and #2 given associated liver cirrhosis. - She is followed by oncology and is on chemotherapy being followed by Dr. Janese Banks.   Coagulopathy ([place]) Will give 10 mg of IV Vitamin K and 2 units of FFP.    Type 2 diabetes mellitus with peripheral neuropathy (HCC) - The patient will be placed on supplemental coverage with NovoLog. - We will continue her basal coverage while monitoring for hypoglycemia. - We will continue Neurontin.   Essential hypertension - We will continue her antihypertensives. - We will place on as needed IV labetalol.   Dyslipidemia - We will hold off statin for now given elevated LFT's  There is no height or weight on file to calculate BMI.  VTE ppx: SCDs Start: 01-29-23 0037   Diet:     Diet   Diet NPO time specified Except for: Sips with Meds   Consultants: ***  Subjective 2023/01/29    Pt reports ***   Objective   Vitals:   01-29-2023 0550 Jan 29, 2023 0600 2023/01/29 0615 01/29/2023 0700  BP: (!) 122/51 (!) 126/59 (!) 125/46 (!) 108/48  Pulse: (!) 101 (!) 112    Resp: (!) 22 19 (!) 25 (!) 21  Temp:  (!) 96.2 F (35.7 C) (!) 96.6 F (35.9 C) 97.6 F (36.4 C)  TempSrc: Bladder     SpO2:  99%      Intake/Output Summary (Last 24 hours) at Jan 29, 2023 0753 Last data filed at 01/29/2023 0550 Gross per 24 hour  Intake 366 ml  Output --  Net 366 ml   There were no vitals filed for this visit.   Physical Exam: *** General: awake, alert, NAD HEENT: atraumatic,  clear conjunctiva, anicteric sclera, MMM, hearing grossly normal Respiratory: normal respiratory effort. Cardiovascular: quick capillary refill, normal S1/S2, RRR, no JVD, murmurs Gastrointestinal: soft, NT, ND Nervous: A&O x3. no gross focal neurologic deficits, normal speech Extremities: moves all equally, no edema, normal tone Skin: dry, intact, normal temperature, normal color. No rashes, lesions or ulcers on exposed skin Psychiatry: normal mood, congruent affect  Labs   I have personally reviewed the following labs and imaging studies CBC    Component Value Date/Time   WBC 11.6 (H) 22-Jan-2023 0453   RBC 2.68 (L) 2023/01/22 0453   HGB 8.1 (L) 01-22-23 0453   HGB 15.3 12/06/2015 0922    HCT 24.6 (L) January 22, 2023 0453   HCT 45.1 12/06/2015 0922   PLT 86 (L) 01-22-23 0453   PLT 236 12/06/2015 0922   MCV 91.8 22-Jan-2023 0453   MCV 94 12/06/2015 0922   MCH 30.2 2023-01-22 0453   MCHC 32.9 01-22-2023 0453   RDW 20.0 (H) 01-22-23 0453   RDW 13.5 12/06/2015 0922   LYMPHSABS 1.0 01/24/2023 2304   LYMPHSABS 2.7 12/06/2015 0922   MONOABS 0.6 01/09/2023 2304   EOSABS 0.0 01/18/2023 2304   EOSABS 0.3 12/06/2015 0922   BASOSABS 0.0 01/25/2023 2304   BASOSABS 0.0 12/06/2015 0922      Latest Ref Rng & Units 01/22/23    4:53 AM 01/25/2023   11:04 PM 11/10/2022   10:14 AM  BMP  Glucose 70 - 99 mg/dL 94  85  180   BUN 8 - 23 mg/dL 35  34  9   Creatinine 0.44 - 1.00 mg/dL 3.07  3.07  1.18   Sodium 135 - 145 mmol/L 138  136  138   Potassium 3.5 - 5.1 mmol/L 4.4  4.2  3.6   Chloride 98 - 111 mmol/L 112  109  112   CO2 22 - 32 mmol/L 17  16  21    Calcium 8.9 - 10.3 mg/dL 6.9  6.9  7.6     CT Head Wo Contrast  Result Date: Jan 22, 2023 CLINICAL DATA:  Mental status change of unknown cause. Gastrointestinal bleeding is ongoing. EXAM: CT HEAD WITHOUT CONTRAST TECHNIQUE: Contiguous axial images were obtained from the base of the skull through the vertex without intravenous contrast. RADIATION DOSE REDUCTION: This exam was performed according to the departmental dose-optimization program which includes automated exposure control, adjustment of the mA and/or kV according to patient size and/or use of iterative reconstruction technique. COMPARISON:  10/01/2022 FINDINGS: Brain: Patchy subcortical low-attenuation changes are demonstrated bilaterally. Changes are more prominent than on the prior study. Probably this represents small vessel ischemic changes but early infarcts are not excluded. Consider MRI for further evaluation. No mass-effect or midline shift. No abnormal extra-axial fluid collections. Gray-white matter junctions are distinct. Basal cisterns are not effaced. No acute  intracranial hemorrhage. Vascular: No hyperdense vessel or unexpected calcification. Skull: Normal. Negative for fracture or focal lesion. Sinuses/Orbits: No acute finding. Other: None. IMPRESSION: Nonspecific low-attenuation changes in the subcortical white matter. Consider MRI to exclude acute ischemic changes. No acute intracranial hemorrhage or mass effect. Electronically Signed   By: Lucienne Capers M.D.   On: 22-Jan-2023 00:00   DG Chest Port 1 View  Result Date: 01/16/2023 CLINICAL DATA:  GI bleed. EXAM: PORTABLE CHEST 1 VIEW COMPARISON:  Chest radiograph dated 10/12/2022. FINDINGS: Right-sided Port-A-Cath with tip in the region of the cavoatrial junction. No focal consolidation, pleural effusion, or pneumothorax. Stable cardiac silhouette. Atherosclerotic calcification of the aorta. No  acute osseous pathology. IMPRESSION: No active disease. Electronically Signed   By: Anner Crete M.D.   On: 01/12/2023 23:25    Disposition Plan & Communication  Patient status: Inpatient  Admitted From: {From:23814} Planned disposition location: {PLAN; DISPOSITION:26386} Anticipated discharge date: *** pending ***  Family Communication: ***    Author: Richarda Osmond, DO [facility] February 04, 2023, 7:53 AM   Available by Epic secure chat 7AM-7PM. If 7PM-7AM, please contact night-coverage.  [facility] contact information found on [email or web address].

## 2023-02-05 NOTE — ED Notes (Signed)
Pt brady at this time 1 of atropine given at this time. Pt being paced at this time.

## 2023-02-05 NOTE — ED Notes (Signed)
3o of rocc given

## 2023-02-05 NOTE — ED Notes (Signed)
RN contacted [facility] and talked with Tonye Becket ref [phone]

## 2023-02-05 NOTE — ED Notes (Signed)
Bair Hugger turned off with patient's current temp 99.29f. Second FFP started. Attending notified about RN holding PO medications due to patient not being alert.

## 2023-02-05 NOTE — Consult Note (Addendum)
GI Inpatient Consult Note  Reason for Consult: GI Bleed   Attending Requesting Consult: Dr. Eugenie Norrie, MD  History of Present Illness: Janice Short is a 62 y.o. female seen for evaluation of GI bleed at the request of admitting hospitalist - Dr. Eugenie Norrie. Patient has a PMH of HTN, HLD, SVT, Vit D deficiency, asthma, OA, T2DM, gout, obesity, and metastatic intrahepatic cholangiocarcinoma. She presented to the [facility] ED via EMS last night for chief complaint of altered mental status and GI bleed. Upon presentation to the ED, BP was 158/85 with RR 22 and otherwise normal vital signs. Labs were significant for hemoglobin 7.2 compared to 9.1 two months ago, platelets 84K, INR 3.3, total bilirubin 4.3, AST 100, ALT 31, alk phos 321, ammonia 231, BUN 34, serum creatinine 3.07, and hs-troponin 27. EKG with no signs of ischemia. CT head w/o contrast revealed nonspecific low-attenuation changes in subcortical white matter with no acute intracranial hemorrhage or mass effect. Portable CXR negative for active disease. She was given IV Protonix bolus and then started on gtt, 1L bolus of IV normal saline, Lactulose rectal enema, and transfused 1 unit pRBCs. She was admitted to the hospital and GI consulted for further evaluation and management.   Patient seen and examined this morning in ED room. No acute events overnight. No family at bedside. Patient is somnolent and appears uncomfortable. She awakes to verbal stimuli but unable to communicate. She is moaning in bed. There have been no signs of overt hematochezia or melena overnight or this morning. Hemoglobin improved to 8.1 this morning. INR elevated to 3.9. She did have EGD and colonoscopy performed by Dr. Allen Norris 04/2021 which removed three subcentimeter tubular adenomas and EGD with normal esophagus, gastritis with hemorrhage, and single 8 mm duodenal polyp in second portion of duodenum s/p EMR with path showing duodenal adenoma. She follows in Oncology with Dr.  Janese Banks and was last seen 11/2022. Last chemotherapy treatment was in October 2023. She had fall in October 2023 causing a cervical fracture requiring surgery and rehab stay. Chemotherapy was put on hold due to her performance status.    Summary of GI Procedures:  CSY 05/06/2021 - one 5 mm TA removed from rectum, two 1-5 mm TAs removed from sigmoid colon  EGD 05/06/2021 - normal esophagus, gastritis with hemorrhage, single 8 mm duodenal polyp in second portion of duodenum s/p EMR with path showing duodenal adenoma   Past Medical History:  Past Medical History:  Diagnosis Date   Allergy    Asthma    Carpal tunnel syndrome on left    Cervical radiculitis    Chronic osteoarthritis    Corns and callosity    Depression    Dermatophytosis of foot    Diabetes mellitus without complication (HCC)    Gout    Hyperlipidemia    Hypertension    Impingement syndrome of left shoulder    Intrahepatic cholangiocarcinoma (HCC)    Lumbosacral neuritis    Microalbuminuria    Obesity    Supraventricular tachycardia 07/01/2015   SVT (supraventricular tachycardia)    Tenosynovitis of wrist    Vitamin D deficiency     Problem List: Patient Active Problem List   Diagnosis Date Noted   Upper GI bleeding 02-08-23   Acute hepatic encephalopathy ([place]) 2023-02-08   Dyslipidemia 2023-02-08   Type 2 diabetes mellitus with peripheral neuropathy ([place]) 02/08/23   UGIB (upper gastrointestinal bleed) 2023-02-08   Acute kidney injury superimposed on chronic kidney disease ([place])  2023-01-19   Coagulopathy ([place]) 2023/01/19   UTI (urinary tract infection) 10/27/2022   Hypocalcemia 10/27/2022   Acute incomplete quadriplegia ([place]) 10/08/2022   Neurogenic bowel 10/08/2022   Pressure ulcer, stage 3 ([place]) 10/08/2022   Fever 10/07/2022   Head injury    Pressure injury of skin 10/06/2022   Central cord syndrome ([place]) 10/02/2022   Cervical spine instability 10/02/2022   Fall 10/01/2022   Closed fracture of C2  vertebra ([place]) 10/01/2022   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[place]) 10/01/2022   Hyperkalemia 10/01/2022   Stage 3a chronic kidney disease ([place]) 10/01/2022   Left hip pain    Laceration of cheek without complication, left, initial encounter    Moderate protein-calorie malnutrition ([place]) 09/25/2022   Simple chronic bronchitis ([place]) 09/25/2022   Tobacco use 09/25/2022   Rectal polyp    Polyp of sigmoid colon    Polyp of duodenum    Intrahepatic cholangiocarcinoma ([place]) 04/12/2021   Goals of care, counseling/discussion 04/12/2021   Mild episode of recurrent major depressive disorder ([place]) 02/08/2019   Uncontrolled type 2 diabetes mellitus with hyperglycemia, with long-term current use of insulin ([place]) 12/17/2017   Spinal stenosis in cervical region 10/15/2016   Diabetic polyneuropathy associated with type 2 diabetes mellitus ([place]) 07/14/2016   Chronic midline low back pain with sciatica 06/22/2016   Primary osteoarthritis of both knees 06/22/2016   Carpal tunnel syndrome 07/01/2015   Cervical radiculitis 07/01/2015   Corn or callus 07/01/2015   Impingement syndrome of shoulder 07/01/2015   Microalbuminuria 07/01/2015   Tenosynovitis of wrist 07/01/2015   Essential hypertension 11/05/2009   Athlete's foot 08/05/2009   Vitamin D deficiency 01/08/2009   Allergic rhinitis 12/28/2008   Lumbosacral neuritis 08/11/2007    Past Surgical History: Past Surgical History:  Procedure Laterality Date   ABDOMINAL HYSTERECTOMY     ANTERIOR CERVICAL DECOMP/DISCECTOMY FUSION N/A 10/02/2022   Procedure: ANTERIOR CERVICAL DECOMPRESSION/DISCECTOMY FUSION 1 LEVEL;  Surgeon: Meade Maw, MD;  Location: [facility] ORS;  Service: Neurosurgery;  Laterality: N/A;  ACDF C4-5.   COLONOSCOPY WITH PROPOFOL N/A 05/06/2021   Procedure: COLONOSCOPY WITH PROPOFOL;  Surgeon: Lucilla Lame, MD;  Location: [facility] ENDOSCOPY;  Service: Endoscopy;  Laterality: N/A;   ESOPHAGOGASTRODUODENOSCOPY (EGD) WITH PROPOFOL N/A  05/06/2021   Procedure: ESOPHAGOGASTRODUODENOSCOPY (EGD) WITH PROPOFOL;  Surgeon: Lucilla Lame, MD;  Location: [facility] ENDOSCOPY;  Service: Endoscopy;  Laterality: N/A;   PORTA CATH INSERTION N/A 07/17/2021   Procedure: PORTA CATH INSERTION;  Surgeon: Algernon Huxley, MD;  Location: [place] CV LAB;  Service: Cardiovascular;  Laterality: N/A;    Allergies: Allergies  Allergen Reactions   Other Shortness Of Breath    Cat dander - shortness of breath Grass pollen - shortness of breath   Platinol [Cisplatin] Itching   Bydureon [Exenatide] Other (See Comments)    Too painful.    Home Medications: (Not in a hospital admission)  Home medication reconciliation was completed with the patient.   Scheduled Inpatient Medications:    amLODipine  2.5 mg Oral Daily   atorvastatin  40 mg Oral Daily   calcium carbonate  1 tablet Oral TID WC   Chlorhexidine Gluconate Cloth  6 each Topical Daily   escitalopram  20 mg Oral Daily   fluticasone furoate-vilanterol  1 puff Inhalation Daily   gabapentin  600 mg Oral BID   insulin glargine-yfgn  20 Units Subcutaneous Q2200   sodium chloride flush  10-40 mL Intracatheter Q12H   zinc sulfate  220 mg Oral Daily  Continuous Inpatient Infusions:    sodium chloride     pantoprazole 8 mg/hr (2023-02-07 0014)    PRN Inpatient Medications:  acetaminophen **OR** acetaminophen, albuterol, diclofenac Sodium, fluticasone, magnesium hydroxide, melatonin, ondansetron **OR** ondansetron (ZOFRAN) IV, sodium chloride flush  Family History: family history includes CAD in her mother; Heart attack in her mother; Kidney disease in her father.  The patient's family history is negative for inflammatory bowel disorders, GI malignancy, or solid organ transplantation.  Social History:   reports that she has been smoking cigarettes. She has never used smokeless tobacco. She reports that she does not currently use alcohol. She reports that she does not currently use drugs  after having used the following drugs: Benzodiazepines. The patient denies ETOH, tobacco, or drug use.   Review of Systems:  Unable to obtain 2/2 patient's altered mental status/encephalopathy    Physical Examination: BP (!) 108/48   Pulse (!) 112   Temp 97.6 F (36.4 C)   Resp (!) 21   SpO2 99%  Gen: Somnolent, appears uncomfortable. Moaning. Awakes to verbal stimuli.  HEENT: PEERLA, EOMI, +scleral icterus  Neck: supple, no JVD or thyromegaly Chest: CTA bilaterally, no wheezes, crackles, or other adventitious sounds CV: RRR, no m/g/c/r Abd: soft, NT, ND, +BS in all four quadrants; no HSM, guarding, ridigity, or rebound tenderness Ext: no edema, well perfused with 2+ pulses, Skin: no rash or lesions noted Lymph: no LAD  Data: Lab Results  Component Value Date   WBC 11.6 (H) 02/07/23   HGB 8.1 (L) 2023/02/07   HCT 24.6 (L) 2023/02/07   MCV 91.8 07-Feb-2023   PLT 86 (L) 2023/02/07   Recent Labs  Lab 02/03/2023 2304 02/07/2023 0453  HGB 7.2* 8.1*   Lab Results  Component Value Date   NA 138 02/07/2023   K 4.4 2023/02/07   CL 112 (H) 2023-02-07   CO2 17 (L) 02/07/2023   BUN 35 (H) 07-Feb-2023   CREATININE 3.07 (H) 02/07/2023   Lab Results  Component Value Date   ALT 31 02/07/2023   AST 105 (H) 02/07/2023   ALKPHOS 309 (H) 02-07-23   BILITOT 4.1 (H) Feb 07, 2023   Recent Labs  Lab 02/02/2023 2304 Feb 07, 2023 0453  APTT 89*  --   INR 3.3* 3.9*    Assessment/Plan:  62 y/o AA female with a PMH of HTN, HLD, SVT, Vit D deficiency, asthma, OA, T2DM, gout, obesity, and metastatic intrahepatic cholangiocarcinoma presented to the [facility] ED via EMS last night for chief complaint of AMS and GI bleed  GI bleed - Differential diagnosis includes PUD, gastritis, erosive esophagitis, duodenitis, AVMs, GAVE, colorectal neoplasm, colon polyps, ischemic colitis, diverticular, etc  Acute hepatic encephalopathy  Coagulopathic - INR 3.9   Acute on chronic kidney  disease  Metastatic intrahepatic cholangiocarcinoma - followed by Dr. Janese Banks, chemotherapy on hold due to performance status   T2DM  Recommendations:  - Maintain 2 large bore IVs for access - Continue to monitor serial H&H. Transfuse for Hgb <7.0.  - No signs of overt gastrointestinal bleeding - Continue to monitor for signs of bleeding  - If there are signs of active bleeding, consider bleeding scan versus CTA - Continue Protonix gtt for gastric protection - Continue to monitor neurological status and use Lactulose enemas - No plans for emergent luminal evaluation at this time due to lack of overt gastrointestinal bleeding and hemodynamic stability - Consider EGD + colonoscopy when clinically feasible  - Continue supportive care per primary team  - Recommend Oncology consult.  I have sent message to Dr. Janese Banks.  - Consider [place] evaluation to update goals of care and code status  - Prognosis is guarded - NPO - GI following along with you  - Further recs to come after evaluation with Dr. Alice Reichert  Thank you for the consult. Please call with questions or concerns.  Geanie Kenning, PA-C [facility] [phone]

## 2023-02-05 NOTE — Progress Notes (Signed)
   01-19-23 1300  Spiritual Encounters  Type of Visit Follow up  Care provided to: [facility] partners present during encounter Physician;Nurse  Referral source Other (comment)  Reason for visit End-of-life  OnCall Visit Yes  Spiritual Framework  Presenting Themes Coping tools  Interventions  Spiritual Care Interventions Made Supported grief process;Mindfulness intervention  Intervention Outcomes  Outcomes Awareness around self/spiritual resourses   This Janice Short responded to a page informing of the arrival of family as earlier requested. Chap joined the MD speaking to the daughter, a sister, a niece and a grandson of the deceased at the family waiting room. Janice Short supported the grieving process by providing a calming presence. At the bedside, the son did not want chaplain service and I respected that. Chap stepped out of the room as requested.

## 2023-02-05 NOTE — ED Notes (Addendum)
Pulse check at this time. No pulse present pads placed

## 2023-02-05 NOTE — ED Notes (Signed)
Pt intubated 7.5 at 23 at lip

## 2023-02-05 NOTE — ED Notes (Signed)
Pt being paced at [place] and now 90.

## 2023-02-05 DEATH — deceased

## 2023-02-15 ENCOUNTER — Inpatient Hospital Stay: Payer: Self-pay | Admitting: Physical Medicine and Rehabilitation

## 2023-02-15 ENCOUNTER — Telehealth: Payer: 59 | Admitting: Hospice and Palliative Medicine

## 2023-03-13 ENCOUNTER — Other Ambulatory Visit: Payer: Self-pay | Admitting: Family Medicine

## 2023-03-13 DIAGNOSIS — E1169 Type 2 diabetes mellitus with other specified complication: Secondary | ICD-10-CM

## 2023-03-13 DIAGNOSIS — E1142 Type 2 diabetes mellitus with diabetic polyneuropathy: Secondary | ICD-10-CM

## 2023-04-03 ENCOUNTER — Other Ambulatory Visit: Payer: Self-pay | Admitting: Family Medicine

## 2023-04-03 DIAGNOSIS — E1142 Type 2 diabetes mellitus with diabetic polyneuropathy: Secondary | ICD-10-CM

## 2023-04-03 DIAGNOSIS — E1169 Type 2 diabetes mellitus with other specified complication: Secondary | ICD-10-CM

## 2023-06-24 ENCOUNTER — Ambulatory Visit: Payer: Medicare Other | Admitting: Neurosurgery
